# Patient Record
Sex: Female | Born: 1980 | Race: White | Hispanic: No | Marital: Married | Smoking: Never smoker
Health system: Southern US, Community
[De-identification: ages and names within clinical notes are randomized; demographics above are authoritative.]

## PROBLEM LIST (undated history)

## (undated) DIAGNOSIS — K319 Disease of stomach and duodenum, unspecified: Secondary | ICD-10-CM

## (undated) DIAGNOSIS — F329 Major depressive disorder, single episode, unspecified: Secondary | ICD-10-CM

## (undated) DIAGNOSIS — Z800 Family history of malignant neoplasm of digestive organs: Secondary | ICD-10-CM

## (undated) DIAGNOSIS — Z803 Family history of malignant neoplasm of breast: Secondary | ICD-10-CM

## (undated) DIAGNOSIS — R0600 Dyspnea, unspecified: Secondary | ICD-10-CM

## (undated) DIAGNOSIS — K829 Disease of gallbladder, unspecified: Secondary | ICD-10-CM

## (undated) DIAGNOSIS — I5042 Chronic combined systolic (congestive) and diastolic (congestive) heart failure: Secondary | ICD-10-CM

## (undated) DIAGNOSIS — M549 Dorsalgia, unspecified: Secondary | ICD-10-CM

## (undated) DIAGNOSIS — E039 Hypothyroidism, unspecified: Secondary | ICD-10-CM

## (undated) DIAGNOSIS — M797 Fibromyalgia: Secondary | ICD-10-CM

## (undated) DIAGNOSIS — G4730 Sleep apnea, unspecified: Secondary | ICD-10-CM

## (undated) DIAGNOSIS — I428 Other cardiomyopathies: Secondary | ICD-10-CM

## (undated) DIAGNOSIS — J45909 Unspecified asthma, uncomplicated: Secondary | ICD-10-CM

## (undated) DIAGNOSIS — F32A Depression, unspecified: Secondary | ICD-10-CM

## (undated) DIAGNOSIS — I10 Essential (primary) hypertension: Secondary | ICD-10-CM

## (undated) DIAGNOSIS — M419 Scoliosis, unspecified: Secondary | ICD-10-CM

## (undated) DIAGNOSIS — E559 Vitamin D deficiency, unspecified: Secondary | ICD-10-CM

## (undated) DIAGNOSIS — Q249 Congenital malformation of heart, unspecified: Secondary | ICD-10-CM

## (undated) DIAGNOSIS — F419 Anxiety disorder, unspecified: Secondary | ICD-10-CM

## (undated) DIAGNOSIS — E538 Deficiency of other specified B group vitamins: Secondary | ICD-10-CM

## (undated) DIAGNOSIS — K589 Irritable bowel syndrome without diarrhea: Secondary | ICD-10-CM

## (undated) DIAGNOSIS — E079 Disorder of thyroid, unspecified: Secondary | ICD-10-CM

## (undated) HISTORY — DX: Dyspnea, unspecified: R06.00

## (undated) HISTORY — DX: Hypothyroidism, unspecified: E03.9

## (undated) HISTORY — DX: Disease of stomach and duodenum, unspecified: K31.9

## (undated) HISTORY — DX: Irritable bowel syndrome, unspecified: K58.9

## (undated) HISTORY — DX: Dorsalgia, unspecified: M54.9

## (undated) HISTORY — DX: Morbid (severe) obesity due to excess calories: E66.01

## (undated) HISTORY — DX: Disorder of thyroid, unspecified: E07.9

## (undated) HISTORY — DX: Fibromyalgia: M79.7

## (undated) HISTORY — DX: Disease of gallbladder, unspecified: K82.9

## (undated) HISTORY — DX: Vitamin D deficiency, unspecified: E55.9

## (undated) HISTORY — DX: Unspecified asthma, uncomplicated: J45.909

## (undated) HISTORY — DX: Family history of malignant neoplasm of digestive organs: Z80.0

## (undated) HISTORY — PX: TRANSTHORACIC ECHOCARDIOGRAM: SHX275

## (undated) HISTORY — DX: Sleep apnea, unspecified: G47.30

## (undated) HISTORY — DX: Depression, unspecified: F32.A

## (undated) HISTORY — DX: Congenital malformation of heart, unspecified: Q24.9

## (undated) HISTORY — DX: Major depressive disorder, single episode, unspecified: F32.9

## (undated) HISTORY — DX: Family history of malignant neoplasm of breast: Z80.3

## (undated) HISTORY — DX: Other cardiomyopathies: I42.8

## (undated) HISTORY — DX: Essential (primary) hypertension: I10

## (undated) HISTORY — DX: Deficiency of other specified B group vitamins: E53.8

## (undated) HISTORY — DX: Anxiety disorder, unspecified: F41.9

## (undated) HISTORY — DX: Scoliosis, unspecified: M41.9

## (undated) HISTORY — DX: Chronic combined systolic (congestive) and diastolic (congestive) heart failure: I50.42

---

## 2011-05-08 HISTORY — PX: CHOLECYSTECTOMY: SHX55

## 2011-07-06 DIAGNOSIS — I5042 Chronic combined systolic (congestive) and diastolic (congestive) heart failure: Secondary | ICD-10-CM

## 2011-07-06 HISTORY — DX: Chronic combined systolic (congestive) and diastolic (congestive) heart failure: I50.42

## 2011-07-06 HISTORY — PX: TRANSTHORACIC ECHOCARDIOGRAM: SHX275

## 2011-08-06 HISTORY — PX: CARDIAC CATHETERIZATION: SHX172

## 2011-08-06 HISTORY — PX: OTHER SURGICAL HISTORY: SHX169

## 2014-01-28 ENCOUNTER — Ambulatory Visit (INDEPENDENT_AMBULATORY_CARE_PROVIDER_SITE_OTHER): Payer: Commercial Managed Care - PPO | Admitting: Internal Medicine

## 2014-01-28 ENCOUNTER — Other Ambulatory Visit (INDEPENDENT_AMBULATORY_CARE_PROVIDER_SITE_OTHER): Payer: Commercial Managed Care - PPO

## 2014-01-28 ENCOUNTER — Encounter: Payer: Self-pay | Admitting: Internal Medicine

## 2014-01-28 ENCOUNTER — Encounter (INDEPENDENT_AMBULATORY_CARE_PROVIDER_SITE_OTHER): Payer: Self-pay

## 2014-01-28 VITALS — BP 138/82 | HR 99 | Temp 98.4°F | Resp 12 | Ht 60.0 in | Wt 196.4 lb

## 2014-01-28 DIAGNOSIS — E039 Hypothyroidism, unspecified: Secondary | ICD-10-CM

## 2014-01-28 DIAGNOSIS — J45909 Unspecified asthma, uncomplicated: Secondary | ICD-10-CM

## 2014-01-28 DIAGNOSIS — I509 Heart failure, unspecified: Secondary | ICD-10-CM

## 2014-01-28 DIAGNOSIS — Z3049 Encounter for surveillance of other contraceptives: Secondary | ICD-10-CM

## 2014-01-28 DIAGNOSIS — I5032 Chronic diastolic (congestive) heart failure: Secondary | ICD-10-CM | POA: Insufficient documentation

## 2014-01-28 DIAGNOSIS — IMO0001 Reserved for inherently not codable concepts without codable children: Secondary | ICD-10-CM | POA: Insufficient documentation

## 2014-01-28 LAB — LIPID PANEL
Cholesterol: 176 mg/dL (ref 0–200)
HDL: 49.8 mg/dL (ref >39.00)
LDL CALC: 107 mg/dL — AB (ref 0–99)
NonHDL: 126.2
TRIGLYCERIDES: 96 mg/dL (ref 0.0–149.0)
Total CHOL/HDL Ratio: 4
VLDL: 19.2 mg/dL (ref 0.0–40.0)

## 2014-01-28 LAB — CBC
HCT: 41.5 % (ref 36.0–46.0)
Hemoglobin: 13.9 g/dL (ref 12.0–15.0)
MCHC: 33.5 g/dL (ref 30.0–36.0)
MCV: 87.5 fl (ref 78.0–100.0)
Platelets: 479 10*3/uL — ABNORMAL HIGH (ref 150.0–400.0)
RBC: 4.75 Mil/uL (ref 3.87–5.11)
RDW: 13.5 % (ref 11.5–15.5)

## 2014-01-28 LAB — BASIC METABOLIC PANEL
BUN: 14 mg/dL (ref 6–23)
CO2: 26 mEq/L (ref 19–32)
Calcium: 8.9 mg/dL (ref 8.4–10.5)
Chloride: 101 mEq/L (ref 96–112)
Creatinine, Ser: 0.9 mg/dL (ref 0.4–1.2)
GFR: 74.28 mL/min (ref >60.00)
Glucose, Bld: 85 mg/dL (ref 70–99)
POTASSIUM: 4.3 meq/L (ref 3.5–5.1)
SODIUM: 137 meq/L (ref 135–145)

## 2014-01-28 LAB — TSH: TSH: 5.79 u[IU]/mL — AB (ref 0.35–4.50)

## 2014-01-28 LAB — T4, FREE: Free T4: 1.01 ng/dL (ref 0.60–1.60)

## 2014-01-28 MED ORDER — DICYCLOMINE HCL 20 MG PO TABS: 20.0000 mg | ORAL_TABLET | ORAL | Status: DC | PRN

## 2014-01-28 MED ORDER — METOPROLOL SUCCINATE ER 25 MG PO TB24: 12.5000 mg | ORAL_TABLET | Freq: Every day | ORAL | Status: DC

## 2014-01-28 MED ORDER — BUDESONIDE-FORMOTEROL FUMARATE 160-4.5 MCG/ACT IN AERO: 2.0000 | INHALATION_SPRAY | Freq: Every day | RESPIRATORY_TRACT | Status: DC

## 2014-01-28 MED ORDER — NORETHINDRONE-ETH ESTRADIOL 1-35 MG-MCG PO TABS: 1.0000 | ORAL_TABLET | Freq: Every day | ORAL | Status: DC

## 2014-01-28 MED ORDER — ALBUTEROL SULFATE HFA 108 (90 BASE) MCG/ACT IN AERS: 2.0000 | INHALATION_SPRAY | Freq: Four times a day (QID) | RESPIRATORY_TRACT | Status: DC | PRN

## 2014-01-28 MED ORDER — FUROSEMIDE 40 MG PO TABS: 40.0000 mg | ORAL_TABLET | Freq: Every day | ORAL | Status: DC

## 2014-01-28 NOTE — Progress Notes (Signed)
Pre visit review using our clinic review tool, if applicable. No additional management support is needed unless otherwise documented below in the visit note. 

## 2014-01-28 NOTE — Assessment & Plan Note (Signed)
Check TSH and free T4. Will refill synthroid when labs back. Recheck in 3 months

## 2014-01-28 NOTE — Assessment & Plan Note (Addendum)
Unclear type although sounds to be systolic given her description. Per her report this was not related to her thyroid (they found CHF in 2013 and been on thyroid medication for 7 years) Refill on lasix given likely fluid overload. Referral to cardiology and will obtain records from previous workup. Checking BMP, CBC, TSH, lipid panel, free T4.

## 2014-01-28 NOTE — Assessment & Plan Note (Signed)
Will refill her birth control until she can establish with ob/gyn.

## 2014-01-28 NOTE — Patient Instructions (Signed)
We will have you go down to the lab to check on your blood work. Start taking your lasix again to see if this helps your breathing problems. Your lungs sounded clear today so I do not think it is from the asthma.  We will also have you get records sent from your last doctor so we don't have to repeat things. We will send you to a heart doctor and you should hear back about that.   We will call you with the lab results and send in your thyroid medicine after the test comes back.  Come back in about 3 months to check on your thyroid.

## 2014-01-28 NOTE — Progress Notes (Signed)
   Subjective:    Patient ID: Meredith Diaz, female    DOB: Mar 03, 1981, 33 y.o.   MRN: [id number]  HPI The patient is a 32 YO female who is coming to establish care. She recently moved from [facility], [place] and needs to see a doctor. She has PMH of CHF (type she doesn't remember), possible HTN, hyperlipidemia, asthma. She has been out of her thyroid medicine for about 1 month. She has been out of her lasix for about 1-2 weeks and thinks she has been a little short of breath while walking and at night time. She still has her asthma medicines and has been using those. She denies abdominal pain, constipation/diarrhea. She does use birth control and her only partner is her husband. She still has some of that but may need more before she can establish with a gynecologist.   Review of Systems  Constitutional: Positive for activity change. Negative for fever, appetite change and fatigue.  HENT: Positive for congestion.   Eyes: Negative.   Respiratory: Positive for shortness of breath. Negative for cough, chest tightness and wheezing.   Cardiovascular: Positive for leg swelling. Negative for chest pain and palpitations.  Gastrointestinal: Negative for abdominal pain, diarrhea, constipation and abdominal distention.  Endocrine: Negative.   Genitourinary: Negative.   Musculoskeletal: Negative.   Neurological: Negative.       Objective:   Physical Exam  Constitutional: She is oriented to person, place, and time. She appears well-developed and well-nourished. No distress.  HENT:  Head: Normocephalic and atraumatic.  Eyes: EOM are normal.  Neck: Normal range of motion.  Cardiovascular: Normal rate and regular rhythm.   Pulmonary/Chest: Effort normal and breath sounds normal. She has no wheezes.  Trace rales in the bases, no wheezing  Abdominal: Soft. Bowel sounds are normal. She exhibits no distension. There is no tenderness. There is no rebound.  Musculoskeletal: She exhibits edema.  1+ pedal edema  bilateral, not in the legs.   Neurological: She is alert and oriented to person, place, and time. Coordination normal.  Skin: Skin is warm and dry.   Filed Vitals:   01/28/14 1040  BP: 138/82  Pulse: 99  Temp: 98.4 F (36.9 C)  TempSrc: Oral  Resp: 12  Height: 5' (1.524 m)  Weight: 196 lb 6.4 oz (89.086 kg)  SpO2: 94%      Assessment & Plan:

## 2014-01-28 NOTE — Assessment & Plan Note (Signed)
Will refill symbicort and ventolin. She appears to be controlled at this time and feel SOB is related to her CHF and not asthma. No focal wheezing or abnormality on exam.

## 2014-01-29 MED ORDER — LEVOTHYROXINE SODIUM 50 MCG PO TABS: 50.0000 ug | ORAL_TABLET | Freq: Every day | ORAL | Status: DC

## 2014-01-29 NOTE — Addendum Note (Signed)
Addended by: Genella Mech A on: 01/29/2014 01:01 PM   Modules accepted: Orders

## 2014-03-08 ENCOUNTER — Ambulatory Visit (INDEPENDENT_AMBULATORY_CARE_PROVIDER_SITE_OTHER): Payer: Commercial Managed Care - PPO | Admitting: Cardiology

## 2014-03-08 ENCOUNTER — Encounter: Payer: Self-pay | Admitting: Cardiology

## 2014-03-08 VITALS — BP 160/100 | HR 103 | Ht 60.0 in | Wt 196.7 lb

## 2014-03-08 DIAGNOSIS — J4530 Mild persistent asthma, uncomplicated: Secondary | ICD-10-CM

## 2014-03-08 DIAGNOSIS — I5031 Acute diastolic (congestive) heart failure: Secondary | ICD-10-CM

## 2014-03-08 DIAGNOSIS — I5032 Chronic diastolic (congestive) heart failure: Secondary | ICD-10-CM

## 2014-03-08 DIAGNOSIS — I10 Essential (primary) hypertension: Secondary | ICD-10-CM

## 2014-03-08 DIAGNOSIS — Q249 Congenital malformation of heart, unspecified: Secondary | ICD-10-CM

## 2014-03-08 DIAGNOSIS — E669 Obesity, unspecified: Secondary | ICD-10-CM

## 2014-03-08 MED ORDER — METOPROLOL SUCCINATE ER 25 MG PO TB24: 25.0000 mg | ORAL_TABLET | Freq: Every day | ORAL | Status: DC

## 2014-03-08 MED ORDER — ISOSORB DINITRATE-HYDRALAZINE 20-37.5 MG PO TABS: 1.0000 | ORAL_TABLET | Freq: Three times a day (TID) | ORAL | Status: DC

## 2014-03-08 MED ORDER — METOLAZONE 2.5 MG PO TABS
2.5000 mg | ORAL_TABLET | Freq: Every day | ORAL | Status: DC
Start: 2014-03-08 — End: 2014-11-24

## 2014-03-08 MED ORDER — ISOSORB DINITRATE-HYDRALAZINE 20-37.5 MG PO TABS: 10.0000 | ORAL_TABLET | Freq: Three times a day (TID) | ORAL | Status: DC

## 2014-03-08 NOTE — Patient Instructions (Signed)
Increase metoprolol ER  25 MG ONCE DAILY  BIDIL 20-37.5 MG TABLET TAKE 1/2 TABLET THREE TIMES A DAY    ZAROXOLYN (METOLAZONE)2.5 MG  TAKE 30 MIN PRIOR TO FUROSEMIDE   FOR 3 DAYS THEN USE AS NEEDED.  PLEASE LAB DRAWN ON Monday Mar 15 2014- BMP , PRO -BNP  Your physician has requested that you have an echocardiogram. Echocardiography is a painless test that uses sound waves to create images of your heart. It provides your doctor with information about the size and shape of your heart and how well your heart's chambers and valves are working. This procedure takes approximately one hour. There are no restrictions for this procedure.   Your physician recommends that you schedule a follow-up appointment in NOV 12 , 2015 AT 4PM WITH LAURA INGOLD.

## 2014-03-09 DIAGNOSIS — I10 Essential (primary) hypertension: Secondary | ICD-10-CM | POA: Insufficient documentation

## 2014-03-09 DIAGNOSIS — Q249 Congenital malformation of heart, unspecified: Secondary | ICD-10-CM | POA: Insufficient documentation

## 2014-03-09 DIAGNOSIS — I5042 Chronic combined systolic (congestive) and diastolic (congestive) heart failure: Secondary | ICD-10-CM | POA: Insufficient documentation

## 2014-03-09 NOTE — Assessment & Plan Note (Signed)
This may be partially responsible for her dyspnea.

## 2014-03-09 NOTE — Assessment & Plan Note (Signed)
I need to get the cardiac cath and MRI reports as well as her previous echocardiogram reports. I need to figure out what operation she had as a child. We'll repeat an echocardiogram to get a new baseline.

## 2014-03-09 NOTE — Assessment & Plan Note (Signed)
Increase beta blocker dose and add BiDil

## 2014-03-09 NOTE — Assessment & Plan Note (Addendum)
But we don't know his EF. We need an echocardiogram to assess that as well as her diastolic function.  Plan will be to continue with metoprolol increased doses 25 mg. She is tachycardic today. She also has positive blood pressure. I will add hydralazine/nitrate and BiDil for afterload reduction given her angioedema from ACE inhibitor/ARB. I will also add Zaroxolyn that she will take daily for the first 3 days and then as needed for weight gain. She will continue on Lasix daily 40 mg with an additional 20-40 mg based on weight change.  Plan:  Increase Toprol to 25 mg  Start BiDil one half tab twice a day  Continue Lasix 40 mg daily  Zaroxolyn 2.5 mg daily 30 minutes 4 AM dose of Lasix for 3 days, then when necessary weight gain greater than 3 pounds.  Check chemistry panel and proBNP next Monday  Follow-up in 2 weeks with either myself or a mid-level practitioner.

## 2014-03-09 NOTE — Assessment & Plan Note (Signed)
She usually weighs in the 180 pound range, but is currently 196. Once we figure out her heart failure situation we will discuss the importance of dietary modification and exercise.

## 2014-03-09 NOTE — Progress Notes (Signed)
PATIENT: Meredith Pattenllis G Doell MRN: [id number] DOB: 10/09/1980 PCP: Judie BonusKollar, Elizabeth A, MD  Clinic Note: Chief Complaint  Patient presents with  . Establish Care    pt denies chest pain. pt states that she does experience sob and some swelling in her feet   HPI: Meredith Diaz is a 33 y.o. female with a PMHof congenital heart disease status post surgical repair as a child child but with no major problems until 2013 when she was minimally to the hospital with a diagnosis of CHF and ejection fraction of roughly 20%. She had about a month in the hospital. Then on medications her PE she gradually improved to an ejection fraction of roughly 50% by last echocardiogram and maybe one year ago. Her heart failure was evaluated with chronic catheterization and cardiac MRI with no gross abnormalities.  [place] [facility] --we do not have previous records.  Interval History: she presents to establish cardiology care, partly because over the last month or so she's been getting more progressively dyspneic on exertion. She also notes more PND and orthopnea. She doesn't really have much in way of edema but usually has distention of her abdomen. She has been taking more than the usual lateral Lasix that she takes. She previously would take this as needed and is now taking 40 mg a day and has not really noticed that much of improvement. She denies any chest tightness or pressure with rest or exertion. She really is not much of a resting dyspnea either. Occasional palpitations but no arrhythmias noted. No syncope/near-syncope or TIA/amaurosis fugax symptoms. She has had some dizziness spells.  Past Medical History  Diagnosis Date  . Chronic diastolic congestive heart failure, NYHA class 2     in 2013, EF was 20% - normal cardiac catheterization and cardiac MRI; follow-up echocardiogram and 14 with EF of 50%  . Asthma   . Thyroid disease   . Depression   . Anxiety   .  Essential hypertension   . Stomach problems   . Hypertension   . Congenital heart disease, adult     status post repair in childhood -- was a failure to thrive child status post Cardec surgery ( unknown details))    Prior Cardiac Evaluation and Past Surgical History: Past Surgical History  Procedure Laterality Date  . Cholecystectomy  2013  . Cardiac catheterization      Allergies  Allergen Reactions  . Lisinopril Swelling    Angioedema facial    Current Outpatient Prescriptions  Medication Sig Dispense Refill  . albuterol (VENTOLIN HFA) 108 (90 BASE) MCG/ACT inhaler Inhale 2 puffs into the lungs every 6 (six) hours as needed. 6.7 g 12  . budesonide-formoterol (SYMBICORT) 160-4.5 MCG/ACT inhaler Inhale 2 puffs into the lungs daily. 1 Inhaler 12  . dicyclomine (BENTYL) 20 MG tablet Take 1 tablet (20 mg total) by mouth as needed for spasms. 60 tablet 3  . furosemide (LASIX) 40 MG tablet Take 1 tablet (40 mg total) by mouth daily. 30 tablet 6  . levothyroxine (SYNTHROID, LEVOTHROID) 50 MCG tablet Take 1 tablet (50 mcg total) by mouth daily before breakfast. 30 tablet 3  . norethindrone-ethinyl estradiol 1/35 (ALAYCEN 1/35) tablet Take 1 tablet by mouth daily. 1 Package 12  . isosorbide-hydrALAZINE (BIDIL) 20-37.5 MG per tablet Take 10-18.5 tablets by mouth 3 (three) times daily. 15 tablet 6  . isosorbide-hydrALAZINE (BIDIL) 20-37.5 MG per tablet Take 1 tablet by mouth 3 (three) times  daily. 16 tablet 0  . metolazone (ZAROXOLYN) 2.5 MG tablet Take 1 tablet (2.5 mg total) by mouth daily. 30 tablet 6  . metoprolol succinate (TOPROL XL) 25 MG 24 hr tablet Take 1 tablet (25 mg total) by mouth daily. 30 tablet 11   No current facility-administered medications for this visit.    History   Social History Narrative    family history includes Depression in her mother; Heart disease in her maternal grandfather and paternal grandmother; Hypertension in her father.  ROS: A comprehensive  Review of Systems - was performed Review of Systems  Constitutional: Negative for weight loss, malaise/fatigue and diaphoresis.       ~10 lb wgt gain over several months.  HENT: Negative for nosebleeds.   Respiratory: Positive for shortness of breath. Negative for cough, hemoptysis and wheezing.   Cardiovascular: Positive for palpitations, orthopnea and PND. Negative for chest pain, claudication and leg swelling.  Gastrointestinal: Negative for blood in stool and melena.  Genitourinary: Negative for hematuria.  Neurological: Positive for dizziness. Negative for tremors, sensory change, speech change, focal weakness, seizures and loss of consciousness.  Endo/Heme/Allergies: Does not bruise/bleed easily.  Psychiatric/Behavioral: Positive for depression. The patient is nervous/anxious.   All other systems reviewed and are negative.  PHYSICAL EXAM BP 160/100 mmHg  Pulse 103  Ht 5' (1.524 m)  Wt 196 lb 11.2 oz (89.223 kg)  BMI 38.42 kg/m2 Physical Exam  Constitutional: She is oriented to person, place, and time. She appears well-developed and well-nourished. No distress.  HENT:  Head: Normocephalic and atraumatic.  Mouth/Throat: Oropharynx is clear and moist. No oropharyngeal exudate.  Eyes: Conjunctivae and EOM are normal. Pupils are equal, round, and reactive to light. No scleral icterus.  Neck: Normal range of motion. Neck supple. No JVD present. No tracheal deviation present.  Cardiovascular: Regular rhythm and normal heart sounds.   Occasional extrasystoles are present. Tachycardia present.  PMI is not displaced.  Exam reveals no gallop and no decreased pulses.   No murmur heard. Pulmonary/Chest: Effort normal and breath sounds normal. No respiratory distress. She has no wheezes. She has no rales. She exhibits no tenderness.  Abdominal: Soft. Bowel sounds are normal. She exhibits no distension and no mass. There is no tenderness.  Genitourinary:  deferred  Musculoskeletal: Normal  range of motion.  Trace, minimal edema  Neurological: She is alert and oriented to person, place, and time. No cranial nerve deficit. Coordination normal.  Skin: Skin is warm and dry. No rash noted. She is not diaphoretic. No erythema.  Psychiatric: She has a normal mood and affect. Her behavior is normal. Judgment and thought content normal.  A bit anxious    Adult ECG Report  Rate: 103 ;  Rhythm: sinus tachycardia  QRS Axis: -22 ;  PR Interval: 144 ;  QRS Duration: 84; QTc: 495  Voltages: borderline for LVH  Conduction Disturbances: none  Other Abnormalities: possible LA 8, borderline criteria for LVH  Narrative Interpretation: mildly abnormal EKG  Recent Labs:  Lab Results  Component Value Date   CHOL 176 01/28/2014   HDL 49.80 01/28/2014   LDLCALC 107* 01/28/2014   TRIG 96.0 01/28/2014   CHOLHDL 4 01/28/2014     Chemistry      Component Value Date/Time   NA 137 01/28/2014 1123   K 4.3 01/28/2014 1123   CL 101 01/28/2014 1123   CO2 26 01/28/2014 1123   BUN 14 01/28/2014 1123   CREATININE 0.9 01/28/2014 1123  Component Value Date/Time   CALCIUM 8.9 01/28/2014 1123      ASSESSMENT / PLAN: 33 year old young woman with prior history of CHF supposedly improved after medical therapy. She now presents with sounds like exacerbation of her chronic heart failure. We don't know what her EF is currently.  Acute diastolic heart failure But we don't know his EF. We need an echocardiogram to assess that as well as her diastolic function.  Plan will be to continue with metoprolol increased doses 25 mg. She is tachycardic today. She also has positive blood pressure. I will add hydralazine/nitrate and BiDil for afterload reduction given her angioedema from ACE inhibitor/ARB. I will also add Zaroxolyn that she will take daily for the first 3 days and then as needed for weight gain. She will continue on Lasix daily 40 mg with an additional 20-40 mg based on weight  change.  Plan:  Increase Toprol to 25 mg  Start BiDil one half tab twice a day  Continue Lasix 40 mg daily  Zaroxolyn 2.5 mg daily 30 minutes 4 AM dose of Lasix for 3 days, then when necessary weight gain greater than 3 pounds.  Check chemistry panel and proBNP next Monday  Follow-up in 2 weeks with either myself or a mid-level practitioner.  Congenital heart disease, adult I need to get the cardiac cath and MRI reports as well as her previous echocardiogram reports. I need to figure out what operation she had as a child. We'll repeat an echocardiogram to get a new baseline.  Essential hypertension Increase beta blocker dose and add BiDil  Obesity (BMI 30-39.9) She usually weighs in the 180 pound range, but is currently 196. Once we figure out her heart failure situation we will discuss the importance of dietary modification and exercise.  Asthma, chronic This may be partially responsible for her dyspnea.    Orders Placed This Encounter  Procedures  . Basic metabolic panel  . Pro b natriuretic peptide (BNP)  . EKG 12-Lead  . 2D Echocardiogram without contrast    Standing Status: Future     Number of Occurrences:      Standing Expiration Date: 03/08/2015    Order Specific Question:  Type of Echo    Answer:  Complete    Order Specific Question:  Where should this test be performed    Answer:  [facility]    Order Specific Question:  Reason for exam-Echo    Answer:  CHF-Acute Diastolic  428.31 / I50.31    Order Specific Question:  Reason for exam-Echo    Answer:  Congestive Heart Failure  428.0 / I50.9   Meds ordered this encounter  Medications  . metoprolol succinate (TOPROL XL) 25 MG 24 hr tablet    Sig: Take 1 tablet (25 mg total) by mouth daily.    Dispense:  30 tablet    Refill:  11  . isosorbide-hydrALAZINE (BIDIL) 20-37.5 MG per tablet    Sig: Take 10-18.5 tablets by mouth 3 (three) times daily.    Dispense:  15 tablet    Refill:  6    (1/2 of  20-37.5 tablet)  . metolazone (ZAROXOLYN) 2.5 MG tablet    Sig: Take 1 tablet (2.5 mg total) by mouth daily.    Dispense:  30 tablet    Refill:  6  . isosorbide-hydrALAZINE (BIDIL) 20-37.5 MG per tablet    Sig: Take 1 tablet by mouth 3 (three) times daily.    Dispense:  16 tablet  Refill:  0    Lot [id number] P2 06/2016    Followup: 2 weeks with Mid-Level Practitioner    Marykay Lex, M.D., M.S. Interventional Cardiologist   Pager # [phone]

## 2014-03-10 ENCOUNTER — Telehealth: Payer: Self-pay | Admitting: Cardiology

## 2014-03-10 NOTE — Telephone Encounter (Signed)
Received 26 pages of records from [facility] for appointment with Nada Boozer, NP on 03/18/14.  Given to N. Hines (medical records) for Corliss Blacker schedule on 03/18/14.

## 2014-03-12 ENCOUNTER — Telehealth: Payer: Self-pay | Admitting: Cardiology

## 2014-03-12 MED ORDER — HYDRALAZINE HCL 25 MG PO TABS: 25.0000 mg | ORAL_TABLET | Freq: Three times a day (TID) | ORAL | Status: DC

## 2014-03-12 NOTE — Telephone Encounter (Signed)
Instructed patient to STOP bidil and start hydralazine 25mg  TID and call us with and updated on how she feels in about a week. If she feels OK can proceed with adding low-dose imdur as denoted below. Patient voiced understanding. Med sent to pharmacy

## 2014-03-12 NOTE — Telephone Encounter (Signed)
Patient states that she was here on Monday 03/08/14 and saw Dr. Herbie Baltimore.  He placed her on a new medication (Bidil she thinks) and she is having a reaction.  Please call.

## 2014-03-12 NOTE — Telephone Encounter (Signed)
Patient thinks she is having side effects from Bidil. She reports joint aches & swelling that has gotten worse since starting on medication on Tuesday. She c/o lightheadedness (which she states is to be expected since this medication is lowering her BP). She noted since yesterday nausea and vomiting and does not feel well. She has been taking Bidil 1/2 tablet 3x daily. Patient does not monitor BP at home. Patient did not take Bidil this AM and is feeling better.  Patient has had angioedema w/lisinopril.   Advice is needed on SE and med dose clarification.  Dr. Elissa Hefty note "Plan: Start BiDil one half tab twice a day" >>>> instructions on AVS state to take 1/2 tablet TID  Routed to Dr. Herbie Baltimore for clarification and Belenda Cruise (to advise on SE?)

## 2014-03-12 NOTE — Telephone Encounter (Signed)
Meredith Diaz  Can you call patient with this info.

## 2014-03-12 NOTE — Telephone Encounter (Signed)
Let's try Meredith Diaz's plan;  Do hydralazine 25 twice a day or 3 times a day.  DH

## 2014-03-12 NOTE — Telephone Encounter (Signed)
N/V not uncommon SE from Bidil, more likely due to isosorbide.  Maybe try hydralazine 25mg  tid by itself then later add low dose imdur (15mg ) and see if she tolerates that.

## 2014-03-15 ENCOUNTER — Ambulatory Visit (HOSPITAL_COMMUNITY)
Admission: RE | Admit: 2014-03-15 | Discharge: 2014-03-15 | Disposition: A | Discharge status: Home / Self Care | Payer: Commercial Managed Care - PPO | Source: Ambulatory Visit | Attending: Cardiology | Admitting: Cardiology

## 2014-03-15 DIAGNOSIS — I5031 Acute diastolic (congestive) heart failure: Secondary | ICD-10-CM

## 2014-03-15 DIAGNOSIS — Z8249 Family history of ischemic heart disease and other diseases of the circulatory system: Secondary | ICD-10-CM | POA: Insufficient documentation

## 2014-03-15 DIAGNOSIS — I509 Heart failure, unspecified: Secondary | ICD-10-CM | POA: Diagnosis present

## 2014-03-15 DIAGNOSIS — I517 Cardiomegaly: Secondary | ICD-10-CM

## 2014-03-15 DIAGNOSIS — I10 Essential (primary) hypertension: Secondary | ICD-10-CM | POA: Diagnosis not present

## 2014-03-15 DIAGNOSIS — I5032 Chronic diastolic (congestive) heart failure: Secondary | ICD-10-CM

## 2014-03-15 NOTE — Progress Notes (Signed)
2D Echocardiogram Complete.  03/15/2014   Kassadie Pancake, RDCS  

## 2014-03-16 LAB — BASIC METABOLIC PANEL
BUN: 13 mg/dL (ref 6–23)
CALCIUM: 8.9 mg/dL (ref 8.4–10.5)
CO2: 29 mEq/L (ref 19–32)
Chloride: 95 mEq/L — ABNORMAL LOW (ref 96–112)
Creat: 0.86 mg/dL (ref 0.50–1.10)
GLUCOSE: 92 mg/dL (ref 70–99)
POTASSIUM: 3.7 meq/L (ref 3.5–5.3)
SODIUM: 136 meq/L (ref 135–145)

## 2014-03-16 LAB — PRO B NATRIURETIC PEPTIDE: Pro B Natriuretic peptide (BNP): 104.8 pg/mL (ref <126)

## 2014-03-18 ENCOUNTER — Encounter: Payer: Self-pay | Admitting: Cardiology

## 2014-03-18 ENCOUNTER — Ambulatory Visit (INDEPENDENT_AMBULATORY_CARE_PROVIDER_SITE_OTHER): Payer: Commercial Managed Care - PPO | Admitting: Cardiology

## 2014-03-18 VITALS — BP 147/86 | HR 93 | Ht 60.0 in | Wt 197.0 lb

## 2014-03-18 DIAGNOSIS — Q249 Congenital malformation of heart, unspecified: Secondary | ICD-10-CM

## 2014-03-18 DIAGNOSIS — I5032 Chronic diastolic (congestive) heart failure: Secondary | ICD-10-CM

## 2014-03-18 DIAGNOSIS — I5031 Acute diastolic (congestive) heart failure: Secondary | ICD-10-CM

## 2014-03-18 MED ORDER — HYDRALAZINE HCL 25 MG PO TABS: 37.5000 mg | ORAL_TABLET | Freq: Three times a day (TID) | ORAL | Status: DC

## 2014-03-18 MED ORDER — FUROSEMIDE 40 MG PO TABS: 60.0000 mg | ORAL_TABLET | Freq: Every day | ORAL | Status: DC

## 2014-03-18 NOTE — Progress Notes (Signed)
03/21/2014   PCP: Judie Bonus, MD   Chief Complaint  Patient presents with  . Follow-up    follow-up Echo    Primary Cardiologist: Dr. Ranae Palms   HPI:  33 y.o. female with a PMHof congenital heart disease status post surgical repair as a child but with no major problems until 2013 when she was admitted to the hospital with a diagnosis of CHF and ejection fraction of roughly 20%. She had about a month in the hospital. Then on medications her EF she gradually improved to an ejection fraction of roughly 50% by last echocardiogram and maybe one year ago. Her heart failure was evaluated with chronic catheterization and cardiac MRI with no gross abnormalities.  He has recently moved [address] [place], [place] from [place], [place] --her records have since come in.  Interval History: she presented 1 week ago to establish cardiology care, partly because over the last month or so she's been getting more progressively dyspneic on exertion. She also notes more PND and orthopnea. She doesn't really have much in way of edema but usually has distention of her abdomen. She has been taking more than the usual lateral Lasix that she takes. She previously would take this as needed and is now taking 40 mg a day and has not really noticed that much of improvement. She denies any chest tightness or pressure with rest or exertion. She really is not much of a resting dyspnea either. Occasional palpitations but no arrhythmias noted. No syncope/near-syncope or TIA/amaurosis fugax symptoms. She has had some dizziness spells.    She has had echo and is back today for results.  She feels much better.  Stairs cause SOB but just walking now does not.  Her HR is still in the 90s.    Echo: Left ventricle: The cavity size was normal. Wall thickness was increased in a pattern of mild LVH. 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 Systolic function was mildly to moderately reduced. The estimated ejection fraction was in the range of 40% to 45%. Diffuse hypokinesis. Doppler parameters are consistent with abnormal left ventricular relaxation (grade 1 diastolic dysfunction). The E/e&' ratio is between 8-15, suggesting indeterminate LV Filling pressure. - Mitral valve: Mildly thickened leaflets . There was trivial regurgitation. - Left atrium: The atrium was normal in size.  Impressions:  - LVEF 40-45%, moderate global hypokinesis, diastolic dysfunction, indeterminate (likely elevated) LV filling pressure. Consider additional imaging with cMRI to evalute for possible apical variant HCM.  With old records, her EF 09/2012 found EF 45-50%.  MRI previously without structural disease.  Cath with minimal CAD.   Allergies  Allergen Reactions  . Lisinopril Swelling    Angioedema facial    Current Outpatient Prescriptions  Medication Sig Dispense Refill  . albuterol (VENTOLIN HFA) 108 (90 BASE) MCG/ACT inhaler Inhale 2 puffs into the lungs every 6 (six) hours as needed. 6.7 g 12  . budesonide-formoterol (SYMBICORT) 160-4.5 MCG/ACT inhaler Inhale 2 puffs into the lungs daily. 1 Inhaler 12  . dicyclomine (BENTYL) 20 MG tablet Take 1 tablet (20 mg total) by mouth as needed for spasms. 60 tablet 3  . furosemide (LASIX) 40 MG tablet Take 1.5 tablets (60 mg total) by mouth daily. 45 tablet 6  . hydrALAZINE (APRESOLINE) 25 MG tablet Take 1.5 tablets (37.5 mg total) by mouth 3 (three) times daily. 135 tablet 6  .  levothyroxine (SYNTHROID, LEVOTHROID) 50 MCG tablet Take 1 tablet (50 mcg total) by mouth daily before breakfast. 30 tablet 3  . metolazone (ZAROXOLYN) 2.5 MG tablet Take 1 tablet (2.5 mg total) by mouth daily. 30 tablet 6  . metoprolol succinate (TOPROL XL) 25 MG 24 hr tablet Take 1 tablet (25 mg total) by mouth daily. 30 tablet 11    . norethindrone-ethinyl estradiol 1/35 (ALAYCEN 1/35) tablet Take 1 tablet by mouth daily. 1 Package 12   No current facility-administered medications for this visit.    Past Medical History  Diagnosis Date  . Chronic diastolic congestive heart failure, NYHA class 2     in 2013, EF was 20% - normal cardiac catheterization and cardiac MRI; follow-up echocardiogram and 14 with EF of 50%  . Asthma   . Thyroid disease   . Depression   . Anxiety   . Essential hypertension   . Stomach problems   . Hypertension   . Congenital heart disease, adult     status post repair in childhood -- was a failure to thrive child status post Cardec surgery ( unknown details))    Past Surgical History  Procedure Laterality Date  . Cholecystectomy  2013  . Cardiac catheterization      ZOX:WRUEAVW:UJROS:General:no colds or fevers, no weight changes Skin:no rashes or ulcers HEENT:no blurred vision, no congestion CV:see HPI PUL:see HPI GI:no diarrhea constipation or melena, no indigestion GU:no hematuria, no dysuria MS:no joint pain, no claudication Neuro:no syncope, no lightheadedness Endo:no diabetes, no thyroid disease  Wt Readings from Last 3 Encounters:  03/18/14 197 lb (89.359 kg)  03/08/14 196 lb 11.2 oz (89.223 kg)  01/28/14 196 lb 6.4 oz (89.086 kg)    PHYSICAL EXAM BP 147/86 mmHg  Pulse 93  Ht 5' (1.524 m)  Wt 197 lb (89.359 kg)  BMI 38.47 kg/m2 General:Pleasant affect, NAD Skin:Warm and dry, brisk capillary refill HEENT:normocephalic, sclera clear, mucus membranes moist Neck:supple, no JVD, no bruits  Heart:S1S2 RRR without murmur, gallup, rub or click Lungs:clear without rales, rhonchi, or wheezes WJX:BJYNAbd:soft, non tender, + BS, do not palpate liver spleen or masses Ext:no lower ext edema, 2+ pedal pulses, 2+ radial pulses Neuro:alert and oriented, MAE, follows commands, + facial symmetry   ASSESSMENT AND PLAN Acute diastolic heart failure Improving, Echo is stable when [facility]  [facility].  I discussed with Dr. Ranae Palms. Harding.  We increased her hydralazine to 37.5 TID and increased lasix to 60 mg daily.   She will follow up with Dr. Ranae Palms. Harding in 4 weeks.  If any problems in the mean time she will call.  Chronic diastolic heart failure See above  Congenital heart disease, adult Stable by MRI see records.

## 2014-03-18 NOTE — Patient Instructions (Signed)
Your physician recommends that you schedule a follow-up appointment in: 4 Weeks with Dr Herbie Baltimore  Your physician has recommended you make the following change in your medication: Increase Furosemide to 1 1/2 tablet daily and Hydralazine to 1 1/2 tablets threes time daily

## 2014-03-21 NOTE — Assessment & Plan Note (Signed)
See above

## 2014-03-21 NOTE — Assessment & Plan Note (Addendum)
Improving, Echo is stable when compared to records from [facility].  I discussed with Dr. Ranae Palms.  We increased her hydralazine to 37.5 TID and increased lasix to 60 mg daily.   She will follow up with Dr. Ranae Palms in 4 weeks.  If any problems in the mean time she will call.

## 2014-03-21 NOTE — Assessment & Plan Note (Signed)
Stable by MRI see records.

## 2014-03-22 ENCOUNTER — Telehealth: Payer: Self-pay | Admitting: *Deleted

## 2014-03-22 NOTE — Telephone Encounter (Signed)
-----   Message from Marykay Lex, MD sent at 03/22/2014 11:39 AM EST ----- Labs look good.  HARDING,DAVID W

## 2014-03-22 NOTE — Telephone Encounter (Signed)
Left message on voicemail of lab results,  Labs are good. Release to [facility] chart

## 2014-03-29 NOTE — Progress Notes (Signed)
Quick Note:  I think this is the last of the studies from [facility], Cath, Cardiac MRI ______

## 2014-04-12 ENCOUNTER — Other Ambulatory Visit (INDEPENDENT_AMBULATORY_CARE_PROVIDER_SITE_OTHER): Payer: Commercial Managed Care - PPO

## 2014-04-12 ENCOUNTER — Ambulatory Visit (INDEPENDENT_AMBULATORY_CARE_PROVIDER_SITE_OTHER): Payer: Commercial Managed Care - PPO | Admitting: Internal Medicine

## 2014-04-12 ENCOUNTER — Encounter: Payer: Self-pay | Admitting: Internal Medicine

## 2014-04-12 ENCOUNTER — Other Ambulatory Visit: Payer: Commercial Managed Care - PPO

## 2014-04-12 VITALS — BP 126/88 | HR 101 | Temp 98.1°F | Resp 16 | Ht 60.0 in | Wt 202.0 lb

## 2014-04-12 DIAGNOSIS — E038 Other specified hypothyroidism: Secondary | ICD-10-CM

## 2014-04-12 DIAGNOSIS — R5383 Other fatigue: Secondary | ICD-10-CM

## 2014-04-12 DIAGNOSIS — I5032 Chronic diastolic (congestive) heart failure: Secondary | ICD-10-CM

## 2014-04-12 LAB — VITAMIN B12: VITAMIN B 12: 179 pg/mL — AB (ref 211–911)

## 2014-04-12 LAB — CBC
HEMATOCRIT: 37.7 % (ref 36.0–46.0)
HEMOGLOBIN: 12.4 g/dL (ref 12.0–15.0)
MCHC: 32.7 g/dL (ref 30.0–36.0)
MCV: 88.1 fl (ref 78.0–100.0)
PLATELETS: 371 10*3/uL (ref 150.0–400.0)
RBC: 4.28 Mil/uL (ref 3.87–5.11)
RDW: 13.8 % (ref 11.5–15.5)
WBC: 14.1 10*3/uL — AB (ref 4.0–10.5)

## 2014-04-12 LAB — TSH: TSH: 2.62 u[IU]/mL (ref 0.35–4.50)

## 2014-04-12 LAB — BASIC METABOLIC PANEL
BUN: 20 mg/dL (ref 6–23)
CHLORIDE: 104 meq/L (ref 96–112)
CO2: 27 mEq/L (ref 19–32)
Calcium: 8.6 mg/dL (ref 8.4–10.5)
Creatinine, Ser: 0.8 mg/dL (ref 0.4–1.2)
GFR: 82.41 mL/min (ref >60.00)
Glucose, Bld: 103 mg/dL — ABNORMAL HIGH (ref 70–99)
POTASSIUM: 3.8 meq/L (ref 3.5–5.1)
SODIUM: 139 meq/L (ref 135–145)

## 2014-04-12 LAB — T4, FREE: Free T4: 0.87 ng/dL (ref 0.60–1.60)

## 2014-04-12 NOTE — Patient Instructions (Signed)
We will check on your blood work today to make sure that all of your electrolytes are normal. We will call you back with your results.  We will send a note to your cardiologist to see if cardiac rehabilitation might be appropriate.  We will see back in about 3-4 months. If you're having worsening problems breathing you can always call our office if you're unable to get a hold of her cardiologist.  Serving Sizes What we call a serving size today is larger than it was in the past. A 1950s fast-food burger contained little more than 1 oz of meat, and a soft drink was 8 oz (1 cup). Today, a "quarter pounder" burger is at least 4 times that amount, and a 32 or 64 oz drink is not uncommon. A possible guide for eating when trying to lose weight is to eat about half as much as you normally do. Some estimates of serving sizes are:  1 Dairy serving:Individual container of yogurt (8 oz) or piece of cheese the size of your thumb (1 oz).  1 Grain serving: 1 slice of bread or  cup pasta.  1 Meat serving: The size of a deck of cards (3 oz).  1 Fruit serving: cup canned fruit or 1 medium fruit.  1 Vegetable serving:  cup of cooked or canned vegetables.  1 Fat serving:The size of 4 stacked dimes. Experts suggest spending 1 or 2 days measuring food portions you commonly eat. This will give you better practice at estimating serving sizes, and will also show whether you are eating an appropriate amount of food to meet your weight goals. If you find that you are eating more than you thought, try measuring your food for a few days so you can "reprogram" yourself to learn what makes a healthy portion for you. SUGGESTIONS FOR CONTROL  In restaurants, share entrees, or ask the waiter to put half the entre in a box or bag before you even touch it.  Order lunch-sized portions. Many restaurants serve 4 to 6 oz of meat at lunch, compared with 8 to 10 oz at dinner.  Split dessert or skip it all together. Have a  piece of fruit when you get home.  At home, use smaller plates and bowls. It will look as if you are eating more.  Plate your food in the kitchen rather than serving it "family style" at the table.  Wait 20 to 30 minutes before taking seconds. This is how long it takes your brain to recognize that you are full.  Check food labels for serving sizes. Eat 1 serving only.  Use measuring cups and spoons to see proper serving sizes.  Buy smaller packages of candy, popcorn, and snacks.  Avoid eating directly out of the bag or carton.  While eating half as much, exercise twice as much. Park further away from the mall, take the stairs instead of the escalator, and walk around your block. Losing weight is a slow, difficult process. It takes long-lasting lifestyle changes. You can make gradual changes over time so they become habits. Look to friends and family to support the healthy changes you are making. Avoid fad diets since they are often only temporary weight loss solutions. Document Released: 01/20/2003 Document Revised: 07/16/2011 Document Reviewed: 07/21/2013 [facility] Patient Information 2015 [place], [place]. This information is not intended to replace advice given to you by your health care provider. Make sure you discuss any questions you have with your health care provider.

## 2014-04-12 NOTE — Assessment & Plan Note (Signed)
Patient has been taking more diuretics since last visit however is up on weight. She states some dietary indiscretions over the holidays. Not much appreciable fluid on exam. Talked with her about diet and exercise and feels she may benefit from cardiac rehabilitation.

## 2014-04-12 NOTE — Assessment & Plan Note (Signed)
Recheck TSH and free T4. She was restarted on 50 g Synthroid daily at last visit.

## 2014-04-12 NOTE — Progress Notes (Signed)
   Subjective:    Patient ID: Meredith Diaz, female    DOB: [birth year], 33 y.o.   MRN: [id number]  HPI The patient is a 33 female comes in today to follow-up on her thyroid. Since our last visit she spent a cardiologist several times and they've been adjusting her diuretics. She states that even with this adjustment she still feels shortness of breath and that she is not getting much fluid. She has also been having some muscle cramps since they've been adjusting diuretics and wonders if she has some kind of nutritional deficiency. She denies any chest pains, abdominal pains, diarrhea, constipation.  Review of Systems  Constitutional: Negative for fever, activity change, appetite change and fatigue.  HENT: Negative.   Eyes: Negative.   Respiratory: Positive for shortness of breath. Negative for cough, chest tightness and wheezing.   Cardiovascular: Negative for chest pain and palpitations.  Gastrointestinal: Negative for abdominal pain, diarrhea, constipation and abdominal distention.  Endocrine: Negative.   Genitourinary: Negative.   Musculoskeletal: Negative.   Neurological: Negative.       Objective:   Physical Exam  Constitutional: She is oriented to person, place, and time. She appears well-developed and well-nourished. No distress.  HENT:  Head: Normocephalic and atraumatic.  Eyes: EOM are normal.  Neck: Normal range of motion.  Cardiovascular: Normal rate and regular rhythm.   Pulmonary/Chest: Effort normal and breath sounds normal. No respiratory distress. She has no wheezes. She has no rales.  Abdominal: Soft. Bowel sounds are normal. She exhibits no distension. There is no tenderness. There is no rebound.  Musculoskeletal: She exhibits edema.  1+ pedal edema bilateral, not in the legs.   Neurological: She is alert and oriented to person, place, and time. Coordination normal.  Skin: Skin is warm and dry.   Filed Vitals:   04/12/14 0813  BP: 126/88  Pulse: 101  Temp:  98.1 F (36.7 C)  TempSrc: Oral  Resp: 16  Height: 5' (1.524 m)  Weight: 202 lb (91.627 kg)  SpO2: 91%      Assessment & Plan:

## 2014-04-13 MED ORDER — VITAMIN B-12 1000 MCG PO TABS: 1000.0000 ug | ORAL_TABLET | Freq: Every day | ORAL | Status: DC

## 2014-04-13 NOTE — Addendum Note (Signed)
Addended by: Genella Mech A on: 04/13/2014 10:31 AM   Modules accepted: Orders

## 2014-04-27 ENCOUNTER — Encounter: Payer: Self-pay | Admitting: Cardiology

## 2014-04-27 ENCOUNTER — Ambulatory Visit (INDEPENDENT_AMBULATORY_CARE_PROVIDER_SITE_OTHER): Payer: Commercial Managed Care - PPO | Admitting: Cardiology

## 2014-04-27 VITALS — BP 118/78 | HR 88 | Ht 60.0 in | Wt 197.7 lb

## 2014-04-27 DIAGNOSIS — E669 Obesity, unspecified: Secondary | ICD-10-CM

## 2014-04-27 DIAGNOSIS — I5042 Chronic combined systolic (congestive) and diastolic (congestive) heart failure: Secondary | ICD-10-CM

## 2014-04-27 DIAGNOSIS — I429 Cardiomyopathy, unspecified: Secondary | ICD-10-CM

## 2014-04-27 DIAGNOSIS — I10 Essential (primary) hypertension: Secondary | ICD-10-CM

## 2014-04-27 DIAGNOSIS — Q249 Congenital malformation of heart, unspecified: Secondary | ICD-10-CM

## 2014-04-27 NOTE — Patient Instructions (Signed)
YOU MAY ADJUST LASIX (furosemide) as needed by your weight. The weight today is  dry weight.  Continue with current medications.  Your physician wants you to follow-up in 6 month Dr Herbie Baltimore- 30 MIN APPT.  You will receive a reminder letter in the mail two months in advance. If you don't receive a letter, please call our office to schedule the follow-up appointment.

## 2014-04-28 ENCOUNTER — Encounter: Payer: Self-pay | Admitting: Cardiology

## 2014-04-28 DIAGNOSIS — I429 Cardiomyopathy, unspecified: Secondary | ICD-10-CM | POA: Insufficient documentation

## 2014-04-28 NOTE — Assessment & Plan Note (Signed)
EF of 40-45% is a little bit lower than what her previous echo had been. I'm not sure to make of the hypertrophic cardio myopathy consideration because the MRI did not suggest that. She is on a good dose of Toprol. Her blood pressure stable some not going to titrate that to 50 mg as initially dissipated. She is also on hydralazine for afterload reduction. Not on an ACE inhibitor or ARB due to history of angioedema with ACE inhibitor.  She is on Lasix which I recommend that she does on a sliding scale adjusting based on her symptoms and her weight.

## 2014-04-28 NOTE — Assessment & Plan Note (Signed)
She put on quite a bit of weight from the 180s into the 190s now. Her baseline weight probably needs to be down about 150. Part of her dyspnea is related to her carrying extra weight. We discussed the importance of dietary modification and exercise.

## 2014-04-28 NOTE — Progress Notes (Signed)
PATIENT: Meredith Diaz MRN: [id number] DOB: 08/06/1980 PCP: Judie BonusKollar, Elizabeth A, MD  Clinic Note: Chief Complaint  Patient presents with  . Follow-up    4 week follow up, Pt. experiencing SOB and swelling, Pt. denies any chest pressure and pain.   HPI: Meredith Diaz is a 33 y.o. female with a PMH of reported congenital heart disease status post surgical repair as a child child but with no major problems until 2013 when she was minimally to the hospital with a diagnosis of CHF and ejection fraction of roughly 25-30 %. She had about a month in the hospital.  Her heart failure was evaluated with cardiac catheterization and cardiac MRI with no gross abnormalities.  He has recently moved to [place], [place] from [place] -- we haven't received records from [place], but still did not have records from her childhood operations. I saw her initially in early November. She then saw Nada BoozerLaura Ingold, NP for blood pressure and dyspnea.  Interval History: She actually is feeling a bit better today than she had been. Her blood pressure is much better. Her edema is better. Her weight is back down from what was in early December when she saw Vernona RiegerLaura. She is back down to the 197 pounds. She still has exertional dyspnea but not as much PND and orthopnea. No chest tightness or pressure with rest or exertion. The one thing she is concerned about is that her urine output has not been as brisk as usually sees it with her 40 mg to 60 mg a day. She has been using Zaroxolyn about 4 days a week.  Occasional, but less frequent palpitations but no arrhythmias noted. No syncope/near-syncope or TIA/amaurosis fugax symptoms. She has had some dizziness spells - but these are better since her blood pressure is improved.  Past Medical History  Diagnosis Date  . Chronic diastolic congestive heart failure, NYHA class 2 07/2011    Echo: EF was 25-30%; No Sig CAD on CATH; No significant finding on Cardiac MRI; follow-up  Ech0 2014 --  EF of 50%  . Asthma   . Thyroid disease   . Depression   . Anxiety   . Essential hypertension   . Stomach problems   . Hypertension   . 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 Cardiomyopathy     Unclear etiology. 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 Cholecystectomy  2013  . Cardiac catheterization  08/2011    Normal Coronaries - LVEDP 32 mmHg  . Transthoracic echocardiogram  07/2011    Moderate concentric LVH, mildly dilated. EF 25-30% no diastolic dysfunction parameters to suggest elevated LAP  . Cardiac mri  April 2013    No suggestion of a congenital abnormality; EF ~ 52% - no regional wall motion abnormalities., no increased myocardial signal intensity. No perfusion abnormality.  . Transthoracic echocardiogram  December 2015    Last EF by echo 40-45% with global hypokinesis, mild LVH possible apical variant hypertrophic cardiomyopathy     Allergies  Allergen Reactions  . Lisinopril Swelling    Angioedema facial    Current Outpatient Prescriptions  Medication Sig Dispense Refill  . albuterol (  VENTOLIN HFA) 108 (90 BASE) MCG/ACT inhaler Inhale 2 puffs into the lungs every 6 (six) hours as needed. 6.7 g 12  . budesonide-formoterol (SYMBICORT) 160-4.5 MCG/ACT inhaler Inhale 2 puffs into the lungs daily. 1 Inhaler 12  . dicyclomine (BENTYL) 20 MG tablet Take 1 tablet (20 mg total) by mouth as needed for spasms. 60 tablet 3  . furosemide (LASIX) 40 MG tablet Take 1.5 tablets (60 mg total) by mouth daily. 45 tablet 6  . hydrALAZINE (APRESOLINE) 25 MG tablet Take 1.5 tablets (37.5 mg total) by mouth 3 (three) times daily. (Patient  taking differently: Take 37.5 mg by mouth 3 (three) times daily. Take 1 tablet (25mg ) PO three times daily.) 135 tablet 6  . levothyroxine (SYNTHROID, LEVOTHROID) 50 MCG tablet Take 1 tablet (50 mcg total) by mouth daily before breakfast. 30 tablet 3  . metolazone (ZAROXOLYN) 2.5 MG tablet Take 1 tablet (2.5 mg total) by mouth daily. 30 tablet 6  . metoprolol succinate (TOPROL XL) 25 MG 24 hr tablet Take 1 tablet (25 mg total) by mouth daily. 30 tablet 11  . norethindrone-ethinyl estradiol 1/35 (ALAYCEN 1/35) tablet Take 1 tablet by mouth daily. 1 Package 12  . vitamin B-12 (CYANOCOBALAMIN) 1000 MCG tablet Take 1 tablet (1,000 mcg total) by mouth daily. 30 tablet 6   No current facility-administered medications for this visit.   No Significant Change to Family and Social History  ROS: A comprehensive Review of Systems - was performed Review of Systems  Constitutional: Negative for malaise/fatigue and diaphoresis.       Back down to a more baseline weight.  HENT: Negative for nosebleeds.   Respiratory: Positive for shortness of breath. Negative for cough, hemoptysis and wheezing.   Cardiovascular: Positive for palpitations (Intermittent. Rare) and orthopnea (Notably improved). Negative for chest pain, claudication, leg swelling and PND.  Gastrointestinal: Negative for blood in stool and melena.  Genitourinary: Negative for hematuria.  Musculoskeletal: Negative.   Neurological: Positive for dizziness. Negative for tremors, sensory change, speech change, focal weakness, seizures and loss of consciousness.  Endo/Heme/Allergies: Does not bruise/bleed easily.  Psychiatric/Behavioral: Positive for depression. The patient is nervous/anxious.   All other systems reviewed and are negative.  PHYSICAL EXAM BP 118/78 mmHg  Pulse 88  Ht 5' (1.524 m)  Wt 197 lb 11.2 oz (89.676 kg)  BMI 38.61 kg/m2 Physical Exam  Constitutional: She is oriented to person, place, and time. She appears well-developed  and well-nourished. No distress.  HENT:  Head: Normocephalic and atraumatic.  Mouth/Throat: Oropharynx is clear and moist. No oropharyngeal exudate.  Eyes: Conjunctivae and EOM are normal. Pupils are equal, round, and reactive to light. No scleral icterus.  Neck: Normal range of motion. Neck supple. No JVD present. No tracheal deviation present.  Cardiovascular: Normal rate, regular rhythm and normal heart sounds.   Occasional extrasystoles are present. PMI is not displaced.  Exam reveals no gallop and no decreased pulses.   No murmur heard. Pulmonary/Chest: Effort normal and breath sounds normal. No respiratory distress. She has no wheezes. She has no rales. She exhibits no tenderness.  Abdominal: Soft. Bowel sounds are normal. She exhibits no distension and no mass. There is no tenderness.  Musculoskeletal: Normal range of motion. She exhibits no edema or tenderness.  Trace, minimal edema  Neurological: She is alert and oriented to person, place, and time. No cranial nerve deficit.  Skin: Skin is warm and dry. No rash noted. She is not diaphoretic. No erythema.  Psychiatric: She has a  normal mood and affect. Her behavior is normal. Judgment and thought content normal.  A bit anxious    Adult ECG Report -not checked.  Recent Labs:     Chemistry      Component Value Date/Time   NA 139 04/12/2014 0839   K 3.8 04/12/2014 0839   CL 104 04/12/2014 0839   CO2 27 04/12/2014 0839   BUN 20 04/12/2014 0839   CREATININE 0.8 04/12/2014 0839   CREATININE 0.86 03/15/2014 0918      Component Value Date/Time   CALCIUM 8.6 04/12/2014 0839      ASSESSMENT / PLAN: 33 year old young woman with prior history of CHF supposedly improved after medical therapy with moderately reduced EF. Likely has diastolic dysfunction with possible hypertrophic cardiomyopathy. This was not seen on MRI so I don't think that this is a true diagnosis. Overall her heart failure symptoms seem to be  improved.  Cardiomyopathy EF of 40-45% is a little bit lower than what her previous echo had been. I'm not sure to make of the hypertrophic cardio myopathy consideration because the MRI did not suggest that. She is on a good dose of Toprol. Her blood pressure stable some not going to titrate that to 50 mg as initially dissipated. She is also on hydralazine for afterload reduction. Not on an ACE inhibitor or ARB due to history of angioedema with ACE inhibitor.  She is on Lasix which I recommend that she does on a sliding scale adjusting based on her symptoms and her weight.  Congenital heart disease, adult Not sure what this was. MRI look relatively benign. Echo suggests possible hypertrophic cardio myopathy but this was not seen on MRI either.  Obesity (BMI 30-39.9) She put on quite a bit of weight from the 180s into the 190s now. Her baseline weight probably needs to be down about 150. Part of her dyspnea is related to her carrying extra weight. We discussed the importance of dietary modification and exercise.  Essential hypertension Currently stable blood pressure. I would like to develop increase her Toprol, but with current blood pressure I'm inclined to leave alone.  Chronic combined systolic and diastolic heart failure, NYHA class 2 On stable medications. She is on hydralazine plus Toprol. Takes Lasix 60 mg daily. Will intermittently do 40 twice a day. Also intermittently uses Zaroxolyn. She is hoping to try to be losing weight and therefore we will have to adjust her dry weight accordingly. She is notably improved from a symptom standpoint with a 5 pound weight reduction since earlier December.    No orders of the defined types were placed in this encounter.   No orders of the defined types were placed in this encounter.    Followup: 2 weeks with Mid-Level Practitioner    Marykay Lex, M.D., M.S. Interventional Cardiologist   Pager # [phone]

## 2014-04-28 NOTE — Assessment & Plan Note (Signed)
Currently stable blood pressure. I would like to develop increase her Toprol, but with current blood pressure I'm inclined to leave alone.

## 2014-04-28 NOTE — Assessment & Plan Note (Signed)
Not sure what this was. MRI look relatively benign. Echo suggests possible hypertrophic cardio myopathy but this was not seen on MRI either.

## 2014-04-28 NOTE — Assessment & Plan Note (Signed)
On stable medications. She is on hydralazine plus Toprol. Takes Lasix 60 mg daily. Will intermittently do 40 twice a day. Also intermittently uses Zaroxolyn. She is hoping to try to be losing weight and therefore we will have to adjust her dry weight accordingly. She is notably improved from a symptom standpoint with a 5 pound weight reduction since earlier December.

## 2014-08-03 ENCOUNTER — Other Ambulatory Visit (INDEPENDENT_AMBULATORY_CARE_PROVIDER_SITE_OTHER): Payer: Commercial Managed Care - PPO

## 2014-08-03 ENCOUNTER — Ambulatory Visit (INDEPENDENT_AMBULATORY_CARE_PROVIDER_SITE_OTHER): Payer: Commercial Managed Care - PPO | Admitting: Internal Medicine

## 2014-08-03 ENCOUNTER — Encounter: Payer: Self-pay | Admitting: Internal Medicine

## 2014-08-03 VITALS — BP 140/82 | HR 87 | Temp 97.8°F | Resp 18 | Wt 202.0 lb

## 2014-08-03 DIAGNOSIS — E538 Deficiency of other specified B group vitamins: Secondary | ICD-10-CM

## 2014-08-03 DIAGNOSIS — I5042 Chronic combined systolic (congestive) and diastolic (congestive) heart failure: Secondary | ICD-10-CM

## 2014-08-03 DIAGNOSIS — J4530 Mild persistent asthma, uncomplicated: Secondary | ICD-10-CM

## 2014-08-03 DIAGNOSIS — E039 Hypothyroidism, unspecified: Secondary | ICD-10-CM

## 2014-08-03 LAB — BASIC METABOLIC PANEL
BUN: 12 mg/dL (ref 6–23)
CALCIUM: 9.2 mg/dL (ref 8.4–10.5)
CO2: 29 meq/L (ref 19–32)
CREATININE: 0.85 mg/dL (ref 0.40–1.20)
Chloride: 98 mEq/L (ref 96–112)
GFR: 81.14 mL/min (ref >60.00)
GLUCOSE: 92 mg/dL (ref 70–99)
POTASSIUM: 4.3 meq/L (ref 3.5–5.1)
SODIUM: 135 meq/L (ref 135–145)

## 2014-08-03 LAB — VITAMIN B12: VITAMIN B 12: 248 pg/mL (ref 211–911)

## 2014-08-03 NOTE — Patient Instructions (Signed)
We will have you talk to a lung doctor to see if they agree that likely the breathing problems are not coming from the asthma. I will also send a note to the heart doctor to let them know that we do not think the asthma is causing the feelings of breathlessness in you.   We will see you back in about 4 months to check on the thyroid this summer. If you are going up in weight please call our office or the cardiologist so we can adjust the medicines for your fluid.

## 2014-08-03 NOTE — Progress Notes (Signed)
Pre visit review using our clinic review tool, if applicable. No additional management support is needed unless otherwise documented below in the visit note. 

## 2014-08-05 NOTE — Assessment & Plan Note (Signed)
Checked thyroid tests last time and stable. Will recheck in about 3 months and adjust her synthroid as needed.

## 2014-08-05 NOTE — Assessment & Plan Note (Addendum)
Controlled at this time. She is up weight since last visit and suspect that some of that is true weight gain as she has no signs of fluid overload on exam. No change to her medications today. Still using lasix daily, hydralazine, metoprolol and metolazone for her heart failure.

## 2014-08-05 NOTE — Progress Notes (Signed)
   Subjective:    Patient ID: Meredith Diaz, female    DOB: Nov 15, 1980, 34 y.o.   MRN: [id number]  HPI The patient is a 34 YO female who is coming in to follow up on her thyroid problems (stable on synthroid, no complications), her diastolic heart failure (stable weights, some dyspnea with exertion, no complications), and her asthma (taking symbicort and using albuterol quite frequently). Her biggest concern is what is causing her dyspnea on exertion. She is using her albuterol more times per week lately as she feels she cannot breathe. Her weights are not increasing although she is up weight from 1 year ago (weight gain) by about 20-30 pounds. She felt much better at the lower weight. She is not sure that the albuterol really works and does not feel the symbicort does anything.   Review of Systems  Constitutional: Negative for fever, activity change, appetite change and fatigue.  HENT: Negative.   Eyes: Negative.   Respiratory: Positive for shortness of breath. Negative for cough, chest tightness and wheezing.   Cardiovascular: Negative for chest pain and palpitations.  Gastrointestinal: Negative for abdominal pain, diarrhea, constipation and abdominal distention.  Endocrine: Negative.   Genitourinary: Negative.   Musculoskeletal: Negative.   Neurological: Negative.       Objective:   Physical Exam  Constitutional: She is oriented to person, place, and time. She appears well-developed and well-nourished. No distress.  Overweight  HENT:  Head: Normocephalic and atraumatic.  Eyes: EOM are normal.  Neck: Normal range of motion.  Cardiovascular: Normal rate and regular rhythm.   Pulmonary/Chest: Effort normal and breath sounds normal. No respiratory distress. She has no wheezes. She has no rales.  Abdominal: Soft. Bowel sounds are normal. She exhibits no distension. There is no tenderness. There is no rebound.  Neurological: She is alert and oriented to person, place, and time.  Coordination normal.  Skin: Skin is warm and dry.   Filed Vitals:   08/03/14 0903  BP: 140/82  Pulse: 87  Temp: 97.8 F (36.6 C)  TempSrc: Oral  Resp: 18  Weight: 202 lb (91.627 kg)  SpO2: 97%      Assessment & Plan:

## 2014-08-05 NOTE — Assessment & Plan Note (Signed)
I do not think her asthma is the cause of the breathing difficulty as her lungs are perfectly clear on exam. Will refer to pulmonology for second opinion but it is unclear to me if she even has asthma or whether her heart and weight problems explain all of her breathing difficulties. Using albuterol multiple times per week when she feels her lungs cannot expand well but does not get much relief of the symptoms. If she has forgotten her symbicort does not notice any difference in symptoms.

## 2014-08-16 ENCOUNTER — Encounter (INDEPENDENT_AMBULATORY_CARE_PROVIDER_SITE_OTHER): Payer: Self-pay

## 2014-08-16 ENCOUNTER — Encounter: Payer: Self-pay | Admitting: Pulmonary Disease

## 2014-08-16 ENCOUNTER — Ambulatory Visit (INDEPENDENT_AMBULATORY_CARE_PROVIDER_SITE_OTHER)
Admission: RE | Admit: 2014-08-16 | Discharge: 2014-08-16 | Disposition: A | Discharge status: Home / Self Care | Payer: Commercial Managed Care - PPO | Source: Ambulatory Visit | Attending: Pulmonary Disease | Admitting: Pulmonary Disease

## 2014-08-16 ENCOUNTER — Ambulatory Visit (INDEPENDENT_AMBULATORY_CARE_PROVIDER_SITE_OTHER): Payer: Commercial Managed Care - PPO | Admitting: Pulmonary Disease

## 2014-08-16 VITALS — BP 142/90 | HR 104 | Temp 98.6°F | Ht 60.0 in | Wt 203.0 lb

## 2014-08-16 DIAGNOSIS — J4530 Mild persistent asthma, uncomplicated: Secondary | ICD-10-CM

## 2014-08-16 DIAGNOSIS — R0600 Dyspnea, unspecified: Secondary | ICD-10-CM

## 2014-08-16 DIAGNOSIS — I429 Cardiomyopathy, unspecified: Secondary | ICD-10-CM

## 2014-08-16 DIAGNOSIS — G478 Other sleep disorders: Secondary | ICD-10-CM

## 2014-08-16 DIAGNOSIS — J984 Other disorders of lung: Secondary | ICD-10-CM | POA: Insufficient documentation

## 2014-08-16 DIAGNOSIS — G4730 Sleep apnea, unspecified: Secondary | ICD-10-CM

## 2014-08-16 DIAGNOSIS — I5042 Chronic combined systolic (congestive) and diastolic (congestive) heart failure: Secondary | ICD-10-CM

## 2014-08-16 DIAGNOSIS — I10 Essential (primary) hypertension: Secondary | ICD-10-CM

## 2014-08-16 DIAGNOSIS — E669 Obesity, unspecified: Secondary | ICD-10-CM

## 2014-08-16 MED ORDER — CLONAZEPAM 0.5 MG PO TABS: ORAL_TABLET | ORAL | Status: DC

## 2014-08-16 NOTE — Patient Instructions (Signed)
It was nice meeting you today...  We have initiated a work up for your shortness of breath, AND your sleep related issues...  Continue your current meds the same for now...    We added one med at this time> KLONOPIN 0.5mg  tabs as a chest wall muscle relaxer>>       Take 1/2 to 1 tab twice daily (start w/ 1/2 tab twice daily & increase to 1 tab twice daily if needed)...  We will schedule Full PFTs and a sleep study ASAP & we will plan a recheck when these are done...  Call for any questions...  You may continue your gradual exercise program..[name]

## 2014-08-16 NOTE — Progress Notes (Signed)
Subjective:     Patient ID: Meredith Diaz, female   DOB: [birth year], 34 y.o.   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, Programmer, multimedia for [employer], pt of DrKollar & referred for pulmonary evaluation w/ hx asthma- she notes increased DOE & no relief from her inhalers> Meredith Diaz is a never smoker & was diagnosed w/ asthma first in 42 at age 34 or so when [facility] for pneumonia (Cariology said she was hosp for CHF w/ EF=25-30% and was in hosp for 57mo) & she's had trouble ever since then, disch on NEBS;  Treated over the yrs w/ MDIs, Symbicort/Advair (the latter didn't help), last had Pred before 2013 hosp;  She was allergy tested in middle school & essentially neg x mild grass reaction by her memory;  She gets "sinus infections" a couple of times each yr (she thinks this is her main trigger) & uses Claritin/ Flonase prn;  Symptoms included SOB/ DOE w/ min activ, feels like she can't get a deep breath/ can't get enough air "in"/ not satisfied breathing (states this sensation is present all the time/constant, & she notes "gasping"); states she notes some wheezing when supine at night & some voice issues (?VCD, tight burning in throat) but denies reflux/ dysphagia/ nocturnal cough or choking (note- she has IBS & s/pGB 2013)... She also c/o sleep issues> can't sleep well while lying down, husb c/o her "wheezing"/ some snoring (but does not indicate that she stops breathing)/ restless sleeper/ leg jerks; she does have some daytime sleepiness issues w/ incr fatigue at work and she'll take naps on weekends; ESS=12... Current Meds> Symbicort160- 2puffs daily, AlbutHFA for prn use...      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 Cumulative data from cardiac evals as follows, she was told her cardiac issues are ?congenital, ?virus damaged her heart>>  she has never had cardiac rehab etc...   CXR 2013 at [facility] showed cardiomegaly, biapical pleural thickening, otherw clear & NAD...  Cardiac MRI from [facility] in 2013 showed EF=52% w/ mild global HK, min AI, RV- wnl, Ao- wnl, mild pectus excavqatum w/ deviation of cardiac apex...  Cath was done in 2013 but I cannot find that report (said no signif CAD)...  EKG 11/15 showed STachy, rate 103, ?LAE, suggestive LVH, NSSTTWA...   2DEcho 11/15 by DrHarding showed mild LVH, ?poss apical variant hypertrophic cardiomyopathy, EF=40-45% w/ diffuse HK, Gr1DD, MV- mildly thickened leaflets and trivMR, norm LA size...   Current Meds> Metoprolol-ER25mg /d, Apresoline25-1.5tabsTid, Lasix40-1.5tabs/d, Zaroxyln2.5mg /d  She has Hx obesity> currently 203# (this is her max wt she says), 5' tall, BMI=39.6; she states that she's lost weight in the past & that she felt much better when her weight was down... She is allergic to ACE inhibitors w/ prev angioedema...   EXAM reveals Afeb, VSS, O2sat=93% on RA at rest;  HEENT- neg, mallampati 2, voice sl hoarse;  Chest- decr BS bilat, clear, decr diaph excursions by percussion, freq sign breaths;  Heart- RRR w/p m/r/g;  Ext- no c/c/e...  Recent LABS> Chems- wnl;  CBC- ok w/ Hg=12.4, WBC=14K;  TSH=2.62;  B12 level= 180-250 (on oral B12- 1041mcg/d)...  CXR 08/16/14 showed norm heart size, clear lungs, NAD...  Spirometry 08/16/14> poor effort (breathing "high" in her lung volumes)> FVC=0.84 (27%), FEV1=0.66 (24%), %1sec=78, mid-flows reduced at 18% predicted; Test indicated severe  restriction w/ small airways dis...   Ambulatory oxygen saturations> O2sat=95% on RA at rest w/ pulse=89/min;  She walked 2 laps and stopped due to SOB- O2sat=83% w/ pulse 121/min...  FENO= 10ppb (results <25ppd implies absence of eosinophilic airway inflammation).  Epworth Sleepiness Score= 12 (indicating that she may be excessively sleepy depending on the situation...  IMP/PLAN>>  Meredith Diaz has been  diagnosed w/ asthma over the last 20+yrs but her current symptoms and spirometry looks more restricted- prob due to "chest wall factors" as we discussed;  She has additional issues from obesity- r/o OSA, and from her Cardiac disease- hx congenital HD w/ surg, HBP, cardiomyopathy w/ combined sys/diast CHF- followed & treated by Cards, DrHarding... We discussed the nature of her dyspnea and the need for additional testing and a trial of KLONOPIN 0.5mg  Bid... We will sched FullPFTs and a Sleep Study and have her return after that...    Past Medical History  Diagnosis Date  . Chronic diastolic congestive heart failure, NYHA class 2 07/2011    Echo: EF was 25-30%; No Sig CAD on CATH; No significant finding on Cardiac MRI; follow-up Ech0 2014 --  EF of 50%  . Asthma   . Thyroid disease  >>  On Synthroid50, TSH 12/15 = 2.62   . Depression   . Anxiety   . Essential hypertension   . Stomach problems   . Hypertension   . 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 Cardiomyopathy     Unclear etiology. Last EF by echo 40-45% with global hypokinesis, mild LVH possible apical variant hypertrophic cardiomyopathy        B12 Deficiency >> on oral B12 supplement 1018mcg/d;  Labs showed B12 level 178 => 248   Past Surgical History  Procedure Laterality Date  . Cholecystectomy  2013  . Cardiac catheterization  08/2011    Normal Coronaries - LVEDP 32 mmHg  . Transthoracic echocardiogram  07/2011    Moderate concentric LVH, mildly dilated. EF 25-30% no diastolic dysfunction parameters to suggest elevated LAP  . Cardiac mri  April 2013    No suggestion of a congenital abnormality; EF ~ 52% - no regional wall motion abnormalities., no increased myocardial signal intensity. No perfusion abnormality.  . Transthoracic echocardiogram  December 2015    Last EF  by echo 40-45% with global hypokinesis, mild LVH possible apical variant hypertrophic cardiomyopathy     Outpatient Encounter Prescriptions as of 08/16/2014  Medication Sig  . albuterol (VENTOLIN HFA) 108 (90 BASE) MCG/ACT inhaler Inhale 2 puffs into the lungs every 6 (six) hours as needed.  . budesonide-formoterol (SYMBICORT) 160-4.5 MCG/ACT inhaler Inhale 2 puffs into the lungs daily.  [name] dicyclomine (BENTYL) 20 MG tablet Take 1 tablet (20 mg total) by mouth as needed for spasms.  . furosemide (LASIX) 40 MG tablet Take 1.5 tablets (60 mg total) by mouth daily.  . hydrALAZINE (APRESOLINE) 25 MG tablet Take 1.5 tablets (37.5 mg total) by mouth 3 (three) times daily. (Patient taking differently: Take 37.5 mg by mouth 3 (three) times daily. Take 1 tablet (25mg ) PO three times daily.)  . levothyroxine (SYNTHROID, LEVOTHROID) 50 MCG tablet Take 1 tablet (50 mcg total) by mouth daily before breakfast.  . metolazone (ZAROXOLYN) 2.5 MG tablet Take 1 tablet (2.5 mg total) by mouth daily.  . metoprolol succinate (TOPROL  XL) 25 MG 24 hr tablet Take 1 tablet (25 mg total) by mouth daily.  . norethindrone-ethinyl estradiol 1/35 (ALAYCEN 1/35) tablet Take 1 tablet by mouth daily.  . vitamin B-12 (CYANOCOBALAMIN) 1000 MCG tablet Take 1 tablet (1,000 mcg total) by mouth daily.  . clobetasol cream (TEMOVATE) 0.05 % as needed.   Allergies  Allergen Reactions  . Lisinopril Swelling    Angioedema facial    Family History  Problem Relation Age of Onset  . Depression Mother   . Hypertension Father   . Heart disease Maternal Grandfather   . Heart disease Paternal Grandmother   . Breast cancer Paternal Grandmother   . Colon cancer Maternal Grandmother   . Asthma Paternal Grandmother    History   Social History  . Marital Status: Married    Spouse Name: N/A  . Number of Children: N/A  . Years of Education: N/A   Occupational History  . editor    Social History Main Topics  . Smoking status: Never  Smoker   . Smokeless tobacco: Not on file  . Alcohol Use: No  . Drug Use: No  . Sexual Activity: Not on file   Other Topics Concern  . Not on file   Social History Narrative   Current Medications, Allergies, Past Medical History, Past Surgical History, Family History, and Social History were reviewed in [employer] record.   Review of Systems             All symptoms NEG except where BOLDED >>  Constitutional:  Denies F/C/S, anorexia, unexpected weight change. HEENT:  No HA, visual changes, earache, nasal symptoms, sore throat, hoarseness. Resp:  cough, sputum, hemoptysis; SOB, tightness, wheezing. Cardio:  CP, palpit, DOE, orthopnea, edema. GI:  Denies N/V/D/C or blood in stool; no reflux, abd pain, distention, or gas. GU:  No dysuria, freq, urgency, hematuria, or flank pain. MS:  Denies joint pain, swelling, tenderness, or decr ROM; no neck pain, back pain, etc. Neuro:  No tremors, seizures, dizziness, syncope, weakness, numbness, gait abn. Skin:  No suspicious lesions or skin rash. Heme:  No adenopathy, bruising, bleeding. Psyche: Denies confusion, sleep disturbance, hallucinations, anxiety, depression.   Objective:   Physical Exam    Vital Signs:  Reviewed...  General:  WD, overweight, 34 y/o WF in NAD; alert & oriented; pleasant & cooperative... HEENT:  [place]/AT; Conjunctiva- pink, Sclera- nonicteric, EOM-wnl, PERRLA, EACs-clear, TMs-wnl; NOSE-clear; THROAT-clear & wnl. Neck:  Supple w/ full ROM; no JVD; normal carotid impulses w/o bruits; no thyromegaly or nodules palpated; no lymphadenopathy. Chest:  Clear to P & A; without wheezes, rales, or rhonchi heard (decr BS at bases) Heart:  Regular Rhythm; norm S1 & S2, gr1/6 SEM, without rubs or gallops detected. Abdomen:  Soft & nontender- no guarding or rebound; normal bowel sounds; no organomegaly or masses palpated. Ext:  Normal ROM; without deformities or arthritic changes; no varicose veins, +venous  insuffic, tr edema;  Pulses intact w/o bruits. Neuro:  CNs II-XII intact; motor testing normal; sensory testing normal; gait normal & balance OK. Derm:  No lesions noted; no rash etc. Lymph:  No cervical, supraclavicular, axillary, or inguinal adenopathy palpated.   Assessment:      IMP/PLAN>>  Meredith Diaz has been diagnosed w/ asthma over the last 20+yrs but her current symptoms and spirometry looks more restricted- prob due to "chest wall factors" as we discussed;  She has additional issues from obesity- r/o OSA, and from her Cardiac disease- hx congenital HD w/  surg, HBP, cardiomyopathy w/ combined sys/diast CHF- followed & treated by Cards, DrHarding... We discussed the nature of her dyspnea and the need for additional testing and a trial of KLONOPIN 0.5mg  Bid... We will sched FullPFTs and a Sleep Study and have her return after that...     Plan:     Patient's Medications  New Prescriptions   CLONAZEPAM (KLONOPIN) 0.5 MG TABLET    Take 1/2-1 tablet PO BID  Previous Medications   ALBUTEROL (VENTOLIN HFA) 108 (90 BASE) MCG/ACT INHALER    Inhale 2 puffs into the lungs every 6 (six) hours as needed.   BUDESONIDE-FORMOTEROL (SYMBICORT) 160-4.5 MCG/ACT INHALER    Inhale 2 puffs into the lungs daily.   CLOBETASOL CREAM (TEMOVATE) 0.05 %    as needed.   DICYCLOMINE (BENTYL) 20 MG TABLET    Take 1 tablet (20 mg total) by mouth as needed for spasms.   FUROSEMIDE (LASIX) 40 MG TABLET    Take 1.5 tablets (60 mg total) by mouth daily.   HYDRALAZINE (APRESOLINE) 25 MG TABLET    Take 1.5 tablets (37.5 mg total) by mouth 3 (three) times daily.   LEVOTHYROXINE (SYNTHROID, LEVOTHROID) 50 MCG TABLET    Take 1 tablet (50 mcg total) by mouth daily before breakfast.   METOLAZONE (ZAROXOLYN) 2.5 MG TABLET    Take 1 tablet (2.5 mg total) by mouth daily.   METOPROLOL SUCCINATE (TOPROL XL) 25 MG 24 HR TABLET    Take 1 tablet (25 mg total) by mouth daily.   NORETHINDRONE-ETHINYL ESTRADIOL 1/35 (ALAYCEN 1/35) TABLET     Take 1 tablet by mouth daily.   VITAMIN B-12 (CYANOCOBALAMIN) 1000 MCG TABLET    Take 1 tablet (1,000 mcg total) by mouth daily.  Modified Medications   No medications on file  Discontinued Medications   No medications on file

## 2014-08-18 ENCOUNTER — Ambulatory Visit (HOSPITAL_BASED_OUTPATIENT_CLINIC_OR_DEPARTMENT_OTHER): Payer: Commercial Managed Care - PPO | Attending: Pulmonary Disease

## 2014-08-18 VITALS — Ht 60.0 in | Wt 202.0 lb

## 2014-08-18 DIAGNOSIS — E669 Obesity, unspecified: Secondary | ICD-10-CM

## 2014-08-18 DIAGNOSIS — R0600 Dyspnea, unspecified: Secondary | ICD-10-CM

## 2014-08-18 DIAGNOSIS — R0683 Snoring: Secondary | ICD-10-CM | POA: Diagnosis not present

## 2014-08-18 DIAGNOSIS — I255 Ischemic cardiomyopathy: Secondary | ICD-10-CM

## 2014-08-18 DIAGNOSIS — J4530 Mild persistent asthma, uncomplicated: Secondary | ICD-10-CM

## 2014-08-18 DIAGNOSIS — G4733 Obstructive sleep apnea (adult) (pediatric): Secondary | ICD-10-CM | POA: Insufficient documentation

## 2014-08-18 DIAGNOSIS — I429 Cardiomyopathy, unspecified: Secondary | ICD-10-CM

## 2014-08-18 DIAGNOSIS — G4710 Hypersomnia, unspecified: Secondary | ICD-10-CM | POA: Diagnosis present

## 2014-08-21 ENCOUNTER — Other Ambulatory Visit: Payer: Self-pay | Admitting: Cardiology

## 2014-08-21 DIAGNOSIS — R0600 Dyspnea, unspecified: Secondary | ICD-10-CM | POA: Diagnosis not present

## 2014-08-21 DIAGNOSIS — E669 Obesity, unspecified: Secondary | ICD-10-CM | POA: Diagnosis not present

## 2014-08-21 NOTE — Sleep Study (Signed)
   NAME: Meredith Diaz DATE OF BIRTH:  01/28/1981 MEDICAL RECORD NUMBER [id number]  LOCATION: [facility]  PHYSICIAN: June Vacha D  DATE OF STUDY: 08/18/2014  SLEEP STUDY TYPE: Nocturnal Polysomnogram               REFERRING PHYSICIAN: Michele Mcalpine, MD  INDICATION FOR STUDY: Hypersomnia with sleep apnea  EPWORTH SLEEPINESS SCORE:   9/24 HEIGHT: 5' (152.4 cm)  WEIGHT: 202 lb (91.627 kg)    Body mass index is 39.45 kg/(m^2).  NECK SIZE: 15 in.  MEDICATIONS: Charted for review  SLEEP ARCHITECTURE: Total sleep time 321 minutes with sleep efficiency 88.8%. Stage I was 2.5%, stage II 62.9%, stage III 18.2%, REM 16.4% of total sleep time. Sleep latency 5 minutes, REM latency 127 minutes, awake after sleep onset 35.5 minutes, arousal index or, bedtime medication: None  RESPIRATORY DATA: Apnea hypopnea index (AHI) 19.8 per hour. 106 total events scored including 3 obstructive apneas and 103 hypopneas. Non-positional events. REM AHI 53.7 per hour. This study was ordered as a diagnostic polysomnogram without CPAP titration.  OXYGEN DATA: Moderate snoring with oxygen desaturation to a nadir of 64% and mean saturation 88.7% on room air.  CARDIAC DATA: Sinus rhythm with occasional PVCs  MOVEMENT/PARASOMNIA: No significant movement disturbance, bathroom 1  IMPRESSION/ RECOMMENDATION:   1) Moderate obstructive sleep apnea/hypopnea syndrome, AHI 19.8 per hour with non-positional events. REM AHI 53.7 per hour. Moderate snoring. This study was ordered as a diagnostic polysomnogram. This patient can return for a dedicated CPAP titration study if desired. 2) Room air oxygen saturation on arrival while upright and awake was 90%. She desaturated to a nadir of 64% with mean oxygen saturation through the study 88.7%. A total of 60.1 minutes was recorded with room air oxygen saturation less than 88%.   Waymon Budge Diplomate, American Board of Sleep Medicine  ELECTRONICALLY  SIGNED ON:  08/21/2014, 11:16 AM [facility] PH: [phone]   FX: [phone] ACCREDITED BY THE AMERICAN ACADEMY OF SLEEP MEDICINE

## 2014-08-24 NOTE — Telephone Encounter (Signed)
Patient sent in the wrong rx to be refilled.  Rx was changed to 1.5 tablets 3 times a day.RN spoke to pharmacist. An refill will be ordered.

## 2014-08-25 ENCOUNTER — Ambulatory Visit (HOSPITAL_COMMUNITY)
Admission: RE | Admit: 2014-08-25 | Discharge: 2014-08-25 | Disposition: A | Discharge status: Home / Self Care | Payer: Commercial Managed Care - PPO | Source: Ambulatory Visit | Attending: Pulmonary Disease | Admitting: Pulmonary Disease

## 2014-08-25 DIAGNOSIS — J4530 Mild persistent asthma, uncomplicated: Secondary | ICD-10-CM

## 2014-08-25 DIAGNOSIS — R0600 Dyspnea, unspecified: Secondary | ICD-10-CM

## 2014-08-25 LAB — PULMONARY FUNCTION TEST
DL/VA % pred: 102 %
DL/VA: 4.35 ml/min/mmHg/L
DLCO unc % pred: 25 %
DLCO unc: 4.72 ml/min/mmHg
FEF 25-75 POST: 1.07 L/s
FEF 25-75 Pre: 0.89 L/sec
FEF2575-%Change-Post: 20 %
FEF2575-%PRED-POST: 34 %
FEF2575-%Pred-Pre: 28 %
FEV1-%Change-Post: 6 %
FEV1-%Pred-Post: 29 %
FEV1-%Pred-Pre: 27 %
FEV1-Post: 0.82 L
FEV1-Pre: 0.77 L
FEV1FVC-%CHANGE-POST: -3 %
FEV1FVC-%Pred-Pre: 106 %
FEV6-%CHANGE-POST: 10 %
FEV6-%Pred-Post: 29 %
FEV6-%Pred-Pre: 26 %
FEV6-PRE: 0.87 L
FEV6-Post: 0.96 L
FEV6FVC-%Pred-Post: 101 %
FEV6FVC-%Pred-Pre: 101 %
FVC-%Change-Post: 9 %
FVC-%Pred-Post: 29 %
FVC-%Pred-Pre: 26 %
FVC-POST: 0.96 L
FVC-Pre: 0.87 L
POST FEV6/FVC RATIO: 100 %
PRE FEV1/FVC RATIO: 88 %
Post FEV1/FVC ratio: 85 %
Pre FEV6/FVC Ratio: 100 %
RV % PRED: 66 %
RV: 0.85 L
TLC % pred: 42 %
TLC: 1.88 L

## 2014-08-25 MED ORDER — ALBUTEROL SULFATE (2.5 MG/3ML) 0.083% IN NEBU
2.5000 mg | INHALATION_SOLUTION | Freq: Once | RESPIRATORY_TRACT | Status: AC
  Administered 2014-08-25: 2.5 mg via RESPIRATORY_TRACT

## 2014-08-26 ENCOUNTER — Ambulatory Visit (INDEPENDENT_AMBULATORY_CARE_PROVIDER_SITE_OTHER): Payer: Commercial Managed Care - PPO | Admitting: Pulmonary Disease

## 2014-08-26 ENCOUNTER — Encounter: Payer: Self-pay | Admitting: Pulmonary Disease

## 2014-08-26 VITALS — BP 110/70 | HR 99 | Temp 98.5°F | Wt 203.6 lb

## 2014-08-26 DIAGNOSIS — G4733 Obstructive sleep apnea (adult) (pediatric): Secondary | ICD-10-CM

## 2014-08-26 MED ORDER — CLONAZEPAM 1 MG PO TABS: 1.0000 mg | ORAL_TABLET | Freq: Two times a day (BID) | ORAL | Status: DC

## 2014-08-27 ENCOUNTER — Ambulatory Visit: Payer: Commercial Managed Care - PPO | Admitting: Pulmonary Disease

## 2014-08-27 ENCOUNTER — Encounter: Payer: Self-pay | Admitting: Pulmonary Disease

## 2014-08-27 NOTE — Progress Notes (Signed)
Subjective:     Patient ID: [facility], female   DOB: [birth year], 34 y.o.   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, Programmer, multimedia for [facility], pt of [facility] & referred for pulmonary evaluation w/ hx asthma- she notes increased DOE & no relief from her inhalers> [facility] is a never smoker & was diagnosed w/ asthma first in 23 at age 39 or so when [facility] for pneumonia ([facility] said she was hosp for CHF w/ EF=25-30% and was in hosp for 75mo) & she's had trouble ever since then, disch on NEBS;  Treated over the yrs w/ MDIs, Symbicort/Advair (the latter didn't help), last had Pred before 2013 hosp;  She was allergy tested in middle school & essentially neg x mild grass reaction by her memory;  She gets "sinus infections" a couple of times each yr (she thinks this is her main trigger) & uses Claritin/ Flonase prn;  Symptoms included SOB/ DOE w/ min activ, feels like she can't get a deep breath/ can't get enough air "in"/ not satisfied breathing (states this sensation is present all the time/constant, & she notes "gasping"); states she notes some wheezing when supine at night & some voice issues (?VCD, tight burning in throat) but denies reflux/ dysphagia/ nocturnal cough or choking (note- she has IBS & s/pGB 2013)... She also c/o sleep issues> can't sleep well while lying down, husb c/o her "wheezing"/ some snoring (but does not indicate that she stops breathing)/ restless sleeper/ leg jerks; she does have some daytime sleepiness issues w/ incr fatigue at work and she'll take naps on weekends; ESS=12... Current Meds> Symbicort160- 2puffs daily, AlbutHFA for prn use...      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 Cumulative data from cardiac evals as follows, she was told her cardiac issues are ?congenital,  ?virus damaged her heart>> she has never had cardiac rehab etc...   CXR 2013 at [facility] showed cardiomegaly, biapical pleural thickening, otherw clear & NAD...  Cardiac MRI from [facility] in 2013 showed EF=52% w/ mild global HK, min AI, RV- wnl, Ao- wnl, mild pectus excavqatum w/ deviation of cardiac apex...  Cath was done in 2013 but I cannot find that report (said no signif CAD)...  EKG 11/15 showed STachy, rate 103, ?LAE, suggestive LVH, NSSTTWA...   2DEcho 11/15 by [facility] showed mild LVH, ?poss apical variant hypertrophic cardiomyopathy, EF=40-45% w/ diffuse HK, Gr1DD, MV- mildly thickened leaflets and trivMR, norm LA size...   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 Notes hx anorexia/ depression when in high school w/ lowest wt=80 lbs; psyche rx & counseling resulted in wt gain & she was ~150 lbs in college; gained to 180# after college & lost wt to 165 prior to her marriage at age 34; now she is 34 y/o & has gained to max 203#... She is allergic to ACE inhibitors w/ prev angioedema...   EXAM reveals Afeb, VSS, O2sat=93% on RA at rest;  HEENT- neg, mallampati 2, voice sl hoarse;  Chest- decr BS bilat, clear, decr diaph excursions by percussion, freq sign breaths;  Heart-  RRR w/p m/r/g;  Ext- no c/c/e...  Recent LABS> Chems- wnl;  CBC- ok w/ Hg=12.4, WBC=14K;  TSH=2.62;  B12 level= 180-250 (on oral B12- 1035mcg/d)...  CXR 08/16/14 showed norm heart size, clear lungs, NAD...  Spirometry 08/16/14> poor effort (breathing "high" in her lung volumes)> FVC=0.84 (27%), FEV1=0.66 (24%), %1sec=78, mid-flows reduced at 18% predicted; Test indicated severe restriction w/ small airways dis...   Ambulatory oxygen saturations> O2sat=95% on RA at rest w/  pulse=89/min;  She walked 2 laps and stopped due to SOB- O2sat=83% w/ pulse 121/min...  FENO= 10ppb (results <25ppd implies absence of eosinophilic airway inflammation).  Epworth Sleepiness Score= 12 (indicating that she may be excessively sleepy depending on the situation... IMP/PLAN>>  [facility] has been diagnosed w/ asthma over the last 20+yrs but her current symptoms and spirometry looks more restricted- prob due to "chest wall factors" as we discussed;  She has additional issues from obesity- r/o OSA, and from her Cardiac disease- hx congenital HD w/ surg, HBP, cardiomyopathy w/ combined sys/diast CHF- followed & treated by [facility], [facility]... We discussed the nature of her dyspnea and the need for additional testing and a trial of KLONOPIN 0.5mg  Bid... We will sched FullPFTs and a Sleep Study and have her return after that...  ~  August 26, 2014:  2wk ROV & [facility] returns having completed her Full PFTs and Sleep study> she has in addition been on the Klonopin 0.5mg  Bid but notes no improvement in her breathing on this dose (tol well just no better)...  Full PFTs 08/25/14 showed FVC=0.87 (26%), FEV1=0.77(27%), %1sec=88, mid-flows reduced at 28% predicted; after bronchodil- the FEV1 improved 6%; Lung volumes reduced w/ TLC=1.88 (42%), RV=0.85 (66%), RV/TLC=45 (some air trapping); DLCO=25% predicted, but corrects to normal (102%) when alveolar ventilation is taken into account...  Sleep Polysomnogram 08/18/14> Mod OSA w/ AHI=20/hr and REM AHI=54, mod snoring, desaturation to 64%...  IMP/PLANS>>  PFTs severely restricted (?effort/ accuracy) & seems to be mostly related to obesity/ chest wall and not to underlying lung parenchymal factors (CXR clear, exam clear, we will consider hi-res CTChest later if needed); In this regard we will increase the KLONOPIN to 1mg Bid before giving up on this med; she understands how important wt reduction is to her overall improvement- may want to consider eating disorder  counseling again... Her Sleep study reveals mod OSA & nocturnal hypoxemia- I discussed w/ DrClance & we decided on CPAP set up> RESMED S10 machine- air/auto w/ heated humidity & climate controlled tubing, set on AUTO 5-15, enroll in AirView, mask interface of choice per DME company & pt preference... We will plan ROV recheck in 6 weeks to review compliance & efficacy, then consider doing an ONO...     Past Medical History  Diagnosis Date    DYSPNEA> restrictive lung dis, morbid obesity... OSA> Sleep study 08/2014 w/ AHI=20, worse in REM, desat to 64%... Hx anorexia => morbid obesity...    . Chronic diastolic congestive heart failure, NYHA class 2 07/2011    Echo: EF was 25-30%; No Sig CAD on CATH; No significant finding on Cardiac MRI; follow-up Ech0 2014 --  EF of 50%  . Asthma   . Thyroid disease  >>  On Synthroid50, TSH 12/15 = 2.62   . Depression   . Anxiety   . Essential hypertension   . Stomach problems   . Hypertension   . 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 Cardiomyopathy     Unclear etiology. Last EF by echo 40-45% with global hypokinesis, mild LVH possible apical variant hypertrophic cardiomyopathy        B12 Deficiency >> on oral B12 supplement 1038mcg/d;  Labs showed B12 level 178 => 248   Past Surgical History  Procedure Laterality Date  . Cholecystectomy  2013  . Cardiac catheterization  08/2011    Normal Coronaries - LVEDP 32 mmHg  . Transthoracic echocardiogram  07/2011    Moderate concentric LVH, mildly dilated. EF 25-30% no diastolic dysfunction parameters to suggest elevated LAP  . Cardiac mri  April 2013    No suggestion of a congenital abnormality; EF ~ 52% - no regional wall motion abnormalities., no increased myocardial signal intensity. No perfusion abnormality.  . Transthoracic echocardiogram   December 2015    Last EF by echo 40-45% with global hypokinesis, mild LVH possible apical variant hypertrophic cardiomyopathy     Outpatient Encounter Prescriptions as of 08/26/2014  Medication Sig  . albuterol (VENTOLIN HFA) 108 (90 BASE) MCG/ACT inhaler Inhale 2 puffs into the lungs every 6 (six) hours as needed.  . budesonide-formoterol (SYMBICORT) 160-4.5 MCG/ACT inhaler Inhale 2 puffs into the lungs daily.  . clobetasol cream (TEMOVATE) 0.05 % as needed.  . clonazePAM (KLONOPIN) 1 MG tablet Take 1 tablet (1 mg total) by mouth 2 (two) times daily.  [name] dicyclomine (BENTYL) 20 MG tablet Take 1 tablet (20 mg total) by mouth as needed for spasms.  . furosemide (LASIX) 40 MG tablet Take 1.5 tablets (60 mg total) by mouth daily.  . hydrALAZINE (APRESOLINE) 25 MG tablet Take 1.5 tablets (37.5 mg total) by mouth 3 (three) times daily. (Patient taking differently: Take 37.5 mg by mouth 3 (three) times daily. Take 1 tablet (25mg ) PO three times daily.)  . levothyroxine (SYNTHROID, LEVOTHROID) 50 MCG tablet Take 1 tablet (50 mcg total) by mouth daily before breakfast.  . metolazone (ZAROXOLYN) 2.5 MG tablet Take 1 tablet (2.5 mg total) by mouth daily.  . metoprolol succinate (TOPROL XL) 25 MG 24 hr tablet Take 1 tablet (25 mg total) by mouth daily.  . norethindrone-ethinyl estradiol 1/35 (ALAYCEN 1/35) tablet Take 1 tablet by mouth daily.  . vitamin B-12 (CYANOCOBALAMIN) 1000 MCG tablet Take 1 tablet (1,000 mcg total) by mouth daily.  . [DISCONTINUED] clonazePAM (KLONOPIN) 0.5 MG tablet Take 1/2-1 tablet PO BID   Allergies  Allergen Reactions  . Lisinopril Swelling    Angioedema facial    Family History  Problem Relation Age of Onset  . Depression Mother   . Hypertension Father   . Heart disease Maternal Grandfather   . Heart disease Paternal Grandmother   . Breast cancer Paternal Grandmother   . Colon cancer Maternal Grandmother   . Asthma Paternal Grandmother    History   Social  History  . Marital Status: Married    Spouse Name: N/A  . Number of Children: N/A  . Years of Education: N/A   Occupational History  . editor    Social History Main Topics  . Smoking status: Never Smoker   . Smokeless tobacco: Not on file  . Alcohol Use: No  . Drug Use: No  . Sexual Activity: Not on file   Other Topics Concern  . Not on file   Social History Narrative    Current Medications, Allergies, Past Medical History, Past Surgical History, Family History, and Social  History were reviewed in [facility] electronic medical record.   Review of Systems             All symptoms NEG except where BOLDED >>  Constitutional:  Denies F/C/S, anorexia, unexpected weight change. HEENT:  No HA, visual changes, earache, nasal symptoms, sore throat, hoarseness. Resp:  cough, sputum, hemoptysis; SOB, tightness, wheezing. Cardio:  CP, palpit, DOE, orthopnea, edema. GI:  Denies N/V/D/C or blood in stool; no reflux, abd pain, distention, or gas. GU:  No dysuria, freq, urgency, hematuria, or flank pain. MS:  Denies joint pain, swelling, tenderness, or decr ROM; no neck pain, back pain, etc. Neuro:  No tremors, seizures, dizziness, syncope, weakness, numbness, gait abn. Skin:  No suspicious lesions or skin rash. Heme:  No adenopathy, bruising, bleeding. Psyche: Denies confusion, sleep disturbance, hallucinations, anxiety, depression.   Objective:   Physical Exam    Vital Signs:  Reviewed...  General:  WD, overweight, 34 y/o WF in NAD; alert & oriented; pleasant & cooperative... HEENT:  [place]/AT; Conjunctiva- pink, Sclera- nonicteric, EOM-wnl, PERRLA, EACs-clear, TMs-wnl; NOSE-clear; THROAT-clear & wnl. Neck:  Supple w/ full ROM; no JVD; normal carotid impulses w/o bruits; no thyromegaly or nodules palpated; no lymphadenopathy. Chest:  Clear to P & A; without wheezes, rales, or rhonchi heard (decr BS at bases) Heart:  Regular Rhythm; norm S1 & S2, gr1/6 SEM, without rubs or gallops  detected. Abdomen:  Obese, soft, nontender- no guarding or rebound; normal bowel sounds; no organomegaly or masses palpated. Ext:  Normal ROM; without deformities or arthritic changes; no varicose veins, +venous insuffic, tr edema;  Pulses intact w/o bruits. Neuro:  CNs II-XII intact; motor testing normal; sensory testing normal; gait normal & balance OK. Derm:  No lesions noted; no rash etc. Lymph:  No cervical, supraclavicular, axillary, or inguinal adenopathy palpated.   Assessment:      DYSPNEA>>  Restrictive lung dis- felt to be due to obesity & chest wall factors and not parenchymal pulm dis => increase Klonopin 1mg  Bid... OSA w/ mod AHI=20/hr (worse w/ REM) on sleep study 08/2014 => arrange for CPAP... Hx anorexia => morbid obesity and she may benefit from psyche counseling again... Hx congenital HD w/ surg, HBP, cardiomyopathy w/ combined sys/diast CHF- followed & treated by [facility], [facility]...      Plan:     Patient's Medications  New Prescriptions   CLONAZEPAM (KLONOPIN) 1 MG TABLET    Take 1 tablet (1 mg total) by mouth 2 (two) times daily.  Previous Medications   ALBUTEROL (VENTOLIN HFA) 108 (90 BASE) MCG/ACT INHALER    Inhale 2 puffs into the lungs every 6 (six) hours as needed.   BUDESONIDE-FORMOTEROL (SYMBICORT) 160-4.5 MCG/ACT INHALER    Inhale 2 puffs into the lungs daily.   CLOBETASOL CREAM (TEMOVATE) 0.05 %    as needed.   DICYCLOMINE (BENTYL) 20 MG TABLET    Take 1 tablet (20 mg total) by mouth as needed for spasms.   FUROSEMIDE (LASIX) 40 MG TABLET    Take 1.5 tablets (60 mg total) by mouth daily.   HYDRALAZINE (APRESOLINE) 25 MG TABLET    Take 1.5 tablets (37.5 mg total) by mouth 3 (three) times daily.   LEVOTHYROXINE (SYNTHROID, LEVOTHROID) 50 MCG TABLET    Take 1 tablet (50 mcg total) by mouth daily before breakfast.   METOLAZONE (ZAROXOLYN) 2.5 MG TABLET    Take 1 tablet (2.5 mg total) by mouth daily.   METOPROLOL SUCCINATE (TOPROL XL) 25 MG 24 HR TABLET  Take 1 tablet (25 mg total) by mouth daily.   NORETHINDRONE-ETHINYL ESTRADIOL 1/35 (ALAYCEN 1/35) TABLET    Take 1 tablet by mouth daily.   VITAMIN B-12 (CYANOCOBALAMIN) 1000 MCG TABLET    Take 1 tablet (1,000 mcg total) by mouth daily.  Modified Medications   No medications on file  Discontinued Medications   CLONAZEPAM (KLONOPIN) 0.5 MG TABLET    Take 1/2-1 tablet PO BID

## 2014-09-01 ENCOUNTER — Telehealth: Payer: Self-pay | Admitting: Pulmonary Disease

## 2014-09-01 NOTE — Telephone Encounter (Signed)
Pt has been called multiple times by writer to find out per SN previous pulm dr that she saw at a younger age. Left message on cell and work # for pt to call back. Waiting return call

## 2014-09-02 NOTE — Telephone Encounter (Signed)
Pt returned Rachel's call Pt relayed the following information: 1. Pulmonologist for the past 7 yrs prior to moving to [place] >> Dr. Orlan Leavens at [facility] @ [phone] 2. Primary Care Provider for the past 7 years prior to moving to [place] >> Dr. Tye [place] (whom left about 1 yr before she did, then pt saw the NP whom has also left the practice) @ [phone] 3. Teenage and college years, she saw her family's PCP and is unable to recall that provider's name >> [facility] [place] @ [phone].  Will print message for SN and route to [place].

## 2014-09-09 NOTE — Telephone Encounter (Signed)
Have spoken with pt's previous pulmonary office and received info. Nothing further is needed at this time.

## 2014-09-17 ENCOUNTER — Telehealth: Payer: Self-pay | Admitting: Cardiology

## 2014-09-23 ENCOUNTER — Ambulatory Visit: Payer: Commercial Managed Care - PPO | Admitting: Pulmonary Disease

## 2014-09-23 NOTE — Telephone Encounter (Signed)
Close encounter 

## 2014-10-14 ENCOUNTER — Encounter: Payer: Self-pay | Admitting: Pulmonary Disease

## 2014-10-14 ENCOUNTER — Ambulatory Visit (INDEPENDENT_AMBULATORY_CARE_PROVIDER_SITE_OTHER): Payer: Commercial Managed Care - PPO | Admitting: Pulmonary Disease

## 2014-10-14 VITALS — BP 130/88 | HR 98 | Temp 98.7°F | Wt 201.0 lb

## 2014-10-14 DIAGNOSIS — G4733 Obstructive sleep apnea (adult) (pediatric): Secondary | ICD-10-CM

## 2014-10-14 DIAGNOSIS — I5032 Chronic diastolic (congestive) heart failure: Secondary | ICD-10-CM

## 2014-10-14 DIAGNOSIS — J4530 Mild persistent asthma, uncomplicated: Secondary | ICD-10-CM

## 2014-10-14 DIAGNOSIS — E039 Hypothyroidism, unspecified: Secondary | ICD-10-CM

## 2014-10-14 DIAGNOSIS — I429 Cardiomyopathy, unspecified: Secondary | ICD-10-CM

## 2014-10-14 DIAGNOSIS — J984 Other disorders of lung: Secondary | ICD-10-CM

## 2014-10-14 DIAGNOSIS — I10 Essential (primary) hypertension: Secondary | ICD-10-CM

## 2014-10-14 DIAGNOSIS — E669 Obesity, unspecified: Secondary | ICD-10-CM

## 2014-10-14 DIAGNOSIS — M4120 Other idiopathic scoliosis, site unspecified: Secondary | ICD-10-CM | POA: Insufficient documentation

## 2014-10-14 DIAGNOSIS — Z9989 Dependence on other enabling machines and devices: Secondary | ICD-10-CM

## 2014-10-14 NOTE — Patient Instructions (Signed)
Today we updated your med list in our EPIC system...    Continue your current medications the same...  We decided to check w/ an ENT specialist to check your vocal cords and evaluate your scratchy voice.Marland KitchenMarland Kitchen  I will check to see who we can send you to for a re-evaluation of your scoliosis as well...  In the meanwhile >>    Continue your diet & exercise program (GREAT JOB! Keep it up)...    Continue the KLONOPIN twice daily & you can adjust 1/2 to 1 tab twice daily as we discussed...    Continue your inhalers as you are doing...  We are checking your CPAP download and still trying to get your pulmonary records from [facility]...  Let's plan a follow up visit in 76mo, sooner if needed for problems.Marland KitchenMarland Kitchen

## 2014-10-14 NOTE — Progress Notes (Signed)
Subjective:     Patient ID: Meredith Diaz, female   DOB: [birth year], 34 y.o.   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, Programmer, multimedia for [employer], pt of [employer] & referred for pulmonary evaluation w/ hx asthma- she notes increased DOE & no relief from her inhalers> Meredith Diaz is a never smoker & was diagnosed w/ asthma first in 23 at age 39 or so when [facility] for pneumonia (Cariology said she was hosp for CHF w/ EF=25-30% and was in hosp for 75mo) & she's had trouble ever since then, disch on NEBS;  Treated over the yrs w/ MDIs, Symbicort/Advair (the latter didn't help), last had Pred before 2013 hosp;  She was allergy tested in middle school & essentially neg x mild grass reaction by her memory;  She gets "sinus infections" a couple of times each yr (she thinks this is her main trigger) & uses Claritin/ Flonase prn;  Symptoms included SOB/ DOE w/ min activ, feels like she can't get a deep breath/ can't get enough air "in"/ not satisfied breathing (states this sensation is present all the time/constant, & she notes "gasping"); states she notes some wheezing when supine at night & some voice issues (?VCD, tight burning in throat) but denies reflux/ dysphagia/ nocturnal cough or choking (note- she has IBS & s/pGB 2013)... She also c/o sleep issues> can't sleep well while lying down, husb c/o her "wheezing"/ some snoring (but does not indicate that she stops breathing)/ restless sleeper/ leg jerks; she does have some daytime sleepiness issues w/ incr fatigue at work and she'll take naps on weekends; ESS=12... Current Meds> Symbicort160- 2puffs daily, AlbutHFA for prn use...      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 Cumulative data from cardiac evals as follows, she was told her cardiac issues are ?congenital,  ?virus damaged her heart>> she has never had cardiac rehab etc...   CXR 2013 at [facility] showed cardiomegaly, biapical pleural thickening, otherw clear & NAD...  Cardiac MRI from [facility] in 2013 showed EF=52% w/ mild global HK, min AI, RV- wnl, Ao- wnl, mild pectus excavqatum w/ deviation of cardiac apex...  Cath was done in 2013 but I cannot find that report (said no signif CAD)...  EKG 11/15 showed STachy, rate 103, ?LAE, suggestive LVH, NSSTTWA...   2DEcho 11/15 by DrHarding showed mild LVH, ?poss apical variant hypertrophic cardiomyopathy, EF=40-45% w/ diffuse HK, Gr1DD, MV- mildly thickened leaflets and trivMR, norm LA size...   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 Notes hx anorexia/ depression when in high school w/ lowest wt=80 lbs; psyche rx & counseling resulted in wt gain & she was ~150 lbs in college; gained to 180# after college & lost wt to 165 prior to her marriage at age 34; now she is 34 y/o & has gained to max 203#... She is allergic to ACE inhibitors w/ prev angioedema...   EXAM reveals Afeb, VSS, O2sat=93% on RA at rest;  HEENT- neg, mallampati 2, voice sl hoarse;  Chest- decr BS bilat, clear, decr diaph excursions by percussion, freq sign breaths;  Heart-  RRR w/p m/r/g;  Ext- no c/c/e...  Recent LABS> Chems- wnl;  CBC- ok w/ Hg=12.4, WBC=14K;  TSH=2.62;  B12 level= 180-250 (on oral B12- 1071mcg/d)...  CXR 08/16/14 showed norm heart size, clear lungs, NAD...  Spirometry 08/16/14> poor effort (breathing "high" in her lung volumes)> FVC=0.84 (27%), FEV1=0.66 (24%), %1sec=78, mid-flows reduced at 18% predicted; Test indicated severe restriction w/ small airways dis...   Ambulatory oxygen saturations> O2sat=95% on RA at rest w/  pulse=89/min;  She walked 2 laps and stopped due to SOB- O2sat=83% w/ pulse 121/min...  FENO= 10ppb (results <25ppd implies absence of eosinophilic airway inflammation).  Epworth Sleepiness Score= 12 (indicating that she may be excessively sleepy depending on the situation... IMP/PLAN>>  Meredith Diaz has been diagnosed w/ asthma over the last 20+yrs but her current symptoms and spirometry looks more restricted- prob due to "chest wall factors" as we discussed;  She has additional issues from obesity- r/o OSA, and from her Cardiac disease- hx congenital HD w/ surg, HBP, cardiomyopathy w/ combined sys/diast CHF- followed & treated by Cards, DrHarding... We discussed the nature of her dyspnea and the need for additional testing and a trial of KLONOPIN 0.5mg  Bid... We will sched FullPFTs and a Sleep Study and have her return after that...   ~  August 26, 2014:  2wk ROV & Meredith Diaz returns having completed her Full PFTs and Sleep study> she has in addition been on the Klonopin 0.5mg  Bid but notes no improvement in her breathing on this dose (tol well just no better)...  Full PFTs 08/25/14 showed FVC=0.87 (26%), FEV1=0.77(27%), %1sec=88, mid-flows reduced at 28% predicted; after bronchodil- the FEV1 improved 6%; Lung volumes reduced w/ TLC=1.88 (42%), RV=0.85 (66%), RV/TLC=45 (some air trapping); DLCO=25% predicted, but corrects to normal (102%) when alveolar ventilation is taken into account...  Sleep Polysomnogram 08/18/14> Mod OSA w/ AHI=20/hr and REM AHI=54, mod snoring, desaturation to 64%...  IMP/PLANS>>  PFTs severely restricted (?effort/ accuracy) & seems to be mostly related to obesity/ chest wall and not to underlying lung parenchymal factors (CXR clear, exam clear, we will consider hi-res CTChest later if needed); In this regard we will increase the KLONOPIN to 1mg Bid before giving up on this med; she understands how important wt reduction is to her overall improvement- may want to consider eating disorder  counseling again... Her Sleep study reveals mod OSA & nocturnal hypoxemia- I discussed w/ DrClance & we decided on CPAP set up> RESMED S10 machine- air/auto w/ heated humidity & climate controlled tubing, set on AUTO 5-15, enroll in AirView, mask interface of choice per DME company & pt preference... We will plan ROV recheck in 6 weeks to review compliance & efficacy, then consider doing an ONO...    ~  October 14, 2014:  6wk ROV & Meredith Diaz has lost 2# in the interval but not able to exercise- walks some, gets winded, stairs are difficult but able to negotiate one flight; we reviewed how critically important wt reduction is for her;  She still appears sl hoarse to me, scratchy voice which she believes is due to "allergy issues" => rec ENT evaluation for completeness;  She increased the Klonopin to 1mg Bid & this is helping some she says- not as SOB at rest, able to get a deeper breath she thinks, etc;  Using CPAP satis w/ good compliance & download data showing AHI<1/hr on CPAP; she has nasal pillows but wants full face mask & we will let [facility] know... We reviewed the following medical problems>>  DYSPNEA- multifactorial> ?asthma is a small component w/ restrictive dis due to obesity & scoliosis as a major factor, and CHF, deconditioning, anxiety as additional factors...    OSA>  Mod OSA on sleep study 4/16 w/ AHI=20/hr and REM AHI=54, mod snoring, desaturation to 64%; started on CPAP via [facility], auto 5-15 w/ good compliance => AHI <1/hr on the CPAP autoset 5-15, she wants to change nasal pillows to full face mask, we will check w/ [employer], then do ONO...    Hx asthma> on Symbicort160- 2sp daily, VentolinHFA rescue prn- using 2-3x/wk; PFT 4/16 w/ severe restriction, + small airways dis & min response to bronchodil; asked to continue inhalers for now...    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    Hx HBP> on MetopER25, Apres25- 1.5 tabsTid, Lasix40- 1.5tabsQam, Zaroxyln2.5 prn swelling- taking it almost every day; K=4.2 5/16 at [facility]; Note> allergic to ACE w/ angioedema of lips in past; BP= 130/88 today w/o CP etc...    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    Eating disorder/ Morbid Obesity> unusual hx of eating disorder w/ ANOREXIA as teen=> OBESITY w/ wt=201#, 5' Tall, BMI=39-40; she notes hx anorexia/ depression when in high school w/ lowest wt=80 lbs; psyche rx & counseling resulted in wt gain & she was ~150 lbs in college; gained to 180# after college & lost wt to 165 prior to her marriage at age 83 (she felt better when wt was down); now she is 34 y/o & has gained to max 203#...    Hypothyroidism> on Synthroid50; Labs 12/15 showed TSH=2.62, FreeT4=0.87    Hx IBS & s/p GB in 2013> aware...    Scoliosis & back pain> she was prev evaluated at [facility] by a back specialist, she does not recall the details, no records available, she notes legs get tired, she is not falling, notes that her hands shake on occas... 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    VitB12 defic> Labs showed B12 blood level 179-248 and she is rec to take B12 orally Qd w/ recheck levels...     Anxiety/ depression> on Klonopin 1mg  Bid at present... EXAM reveals Afeb, VSS, O2sat=94% on RA at rest;  HEENT- neg, mallampati 2, voice sl hoarse;  Chest- decr BS bilat, clear, decr diaph excursions by percussion, occas sign breaths;  Heart- RRR w/p m/r/g;  Ext- no c/c/e.  LABS 5/16 at [facility] showed Chems- wnl w/ Cr=1.04, BS=83, A1c=5.8;  FLP- at goals on diet alone...  [employer] AirView download> 5/11 - 10/13/14 showed good compliance w/ AHI <1/hr on the CPAP autoset 5-15... PLAN>>  She has a very complex medical situation as outlined above, and we are hampered by lack of old records (I have asked her to help in getting this data);  For now she will continue  the Symbicort, Ventolin, Klonopin, & CPAP; we will have [facility] look into full face mask per her preference; then proceed w/ ONO;  She knows the importance of wt reduction & may want to consider counseling from Dr. Stephania Fragmin for eating disorder;  Continue cardiac meds and f/u w/ DrHarding;  She is only 34 y/o w/ signif pulm restriction & heart disease;  We will XRay her spine in anticipation of getting old records from [place] & poss referral if this is playing a signif roll as well...    Past Medical History  Diagnosis Date    DYSPNEA> restrictive lung dis, morbid obesity... OSA> Sleep study 08/2014 w/ AHI=20, worse in REM, desat to 64%... Hx anorexia => morbid obesity...    . Chronic diastolic congestive heart failure, NYHA class 2 07/2011    Echo: EF was 25-30%; No Sig CAD on CATH; No significant finding on Cardiac MRI; follow-up Ech0 2014 --  EF of 50%  . Asthma   . Thyroid disease  >>  On Synthroid50, TSH 12/15 = 2.62   . Depression   . Anxiety   . Essential hypertension   . Stomach problems   . Hypertension   . 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 Cardiomyopathy     Unclear etiology. Last EF by echo 40-45% with global hypokinesis, mild LVH possible apical variant hypertrophic  cardiomyopathy        B12 Deficiency >> on oral B12 supplement 1072mcg/d;  Labs showed B12 level 178 => 248    Past Surgical History  Procedure Laterality Date  . Cholecystectomy  2013  . Cardiac catheterization  08/2011    Normal Coronaries - LVEDP 32 mmHg  . Transthoracic echocardiogram  07/2011    Moderate concentric LVH, mildly dilated. EF 25-30% no diastolic dysfunction parameters to suggest elevated LAP  . Cardiac mri  April 2013    No suggestion of a congenital abnormality; EF ~ 52% - no regional wall motion abnormalities., no increased myocardial signal intensity. No perfusion abnormality.  . Transthoracic echocardiogram  December 2015    Last EF by echo 40-45% with global hypokinesis, mild LVH possible apical variant hypertrophic cardiomyopathy     Outpatient Encounter Prescriptions as of 10/14/2014  Medication Sig  . albuterol (VENTOLIN HFA) 108 (90 BASE) MCG/ACT inhaler Inhale 2 puffs into the lungs every 6 (six) hours as needed.  . budesonide-formoterol (SYMBICORT) 160-4.5 MCG/ACT inhaler Inhale 2 puffs into the lungs daily.  . clobetasol cream (TEMOVATE) 0.05 % as needed.  . clonazePAM (KLONOPIN) 1 MG tablet Take 1 tablet (1 mg total) by mouth 2 (two) times daily.  [name] dicyclomine (BENTYL) 20 MG tablet Take 1 tablet (20 mg total) by mouth as needed for spasms.  . furosemide (LASIX) 40 MG tablet Take 1.5 tablets (60 mg total) by mouth daily.  . hydrALAZINE (APRESOLINE) 25 MG tablet Take 1.5 tablets (37.5 mg total) by mouth 3 (three) times daily. (Patient taking differently: Take 37.5 mg by mouth 3 (three) times daily. Take 1 tablet ( ) PO three times daily.)  . levothyroxine (SYNTHROID, LEVOTHROID) 50 MCG tablet Take 1 tablet (50 mcg total) by mouth daily before breakfast.  . metolazone (ZAROXOLYN) 2.5 MG tablet Take 1 tablet (2.5 mg total) by mouth daily.  . metoprolol succinate (TOPROL XL) 25 MG 24 hr tablet Take 1  tablet (25 mg total) by mouth daily.  .  norethindrone-ethinyl estradiol 1/35 (ALAYCEN 1/35) tablet Take 1 tablet by mouth daily.  . vitamin B-12 (CYANOCOBALAMIN) 1000 MCG tablet Take 1 tablet (1,000 mcg total) by mouth daily.   No facility-administered encounter medications on file as of 10/14/2014.    Allergies  Allergen Reactions  . Lisinopril Swelling    Angioedema facial    Immunization History  Administered Date(s) Administered  . Influenza-Unspecified 01/15/2014    Current Medications, Allergies, Past Medical History, Past Surgical History, Family History, and Social History were reviewed in [employer] record.   Review of Systems             All symptoms NEG except where BOLDED >>  Constitutional:  Denies F/C/S, anorexia, unexpected weight change. HEENT:  No HA, visual changes, earache, nasal symptoms, sore throat, hoarseness. Resp:  cough, sputum, hemoptysis; SOB, tightness, wheezing. Cardio:  CP, palpit, DOE, orthopnea, edema. GI:  Denies N/V/D/C or blood in stool; no reflux, abd pain, distention, or gas. GU:  No dysuria, freq, urgency, hematuria, or flank pain. MS:  Denies joint pain, swelling, tenderness, or decr ROM; no neck pain, back pain, etc. Neuro:  No tremors, seizures, dizziness, syncope, weakness, numbness, gait abn. Skin:  No suspicious lesions or skin rash. Heme:  No adenopathy, bruising, bleeding. Psyche: Denies confusion, sleep disturbance, hallucinations, anxiety, depression.   Objective:   Physical Exam    Vital Signs:  Reviewed...  General:  WD, overweight, 34 y/o WF in NAD; alert & oriented; pleasant & cooperative... HEENT:  [place]/AT; Conjunctiva- pink, Sclera- nonicteric, EOM-wnl, PERRLA, EACs-clear, TMs-wnl; NOSE-clear; THROAT-clear & wnl. Neck:  Supple w/ full ROM; no JVD; normal carotid impulses w/o bruits; no thyromegaly or nodules palpated; no lymphadenopathy. Chest:  Clear to P & A; without wheezes, rales, or rhonchi heard (decr BS at bases) Heart:  Regular  Rhythm; gr1/6 SEM, without rubs or gallops detected. Abdomen:  Obese, soft, nontender- no guarding or rebound; normal bowel sounds; no organomegaly or masses palpated. Ext:  Normal ROM; without deformities or arthritic changes; no varicose veins, +venous insuffic, tr edema;  Pulses intact w/o bruits. Neuro:  No focal neuro deficits, gait normal & balance OK. Derm:  No lesions noted; no rash etc. Lymph:  No cervical, supraclavicular, axillary, or inguinal adenopathy palpated.   Assessment:      DYSPNEA>> multifactorial Restrictive lung dis- felt to be due to obesity & chest wall factors and not parenchymal pulm dis => continue Klonopin 1mg  Bid... OSA w/ mod AHI=20/hr (worse w/ REM & desat to 64%) on sleep study 08/2014 => now on CPAP Auto 5-15 w/ good compliance and AHI<1, we will proceed w/ ONO... Hx eating disorder w/ anorexia => morbid obesity and she may benefit from psyche counseling again... Hx congenital HD w/ surg 1981, HBP, cardiomyopathy w/ combined sys/diast CHF- followed & treated by Cards, DrHarding...   PLAN>>  She has a very complex medical situation as outlined above, and we are hampered by lack of old records (I have asked her to help in getting this data);  For now she will continue the Symbicort, Ventolin, Klonopin, & CPAP; we will have [facility] look into full face mask per her preference; then proceed w/ ONO;  She knows the importance of wt reduction & may want to consider counseling from Dr. Stephania Fragmin for eating disorder;  Continue cardiac meds and f/u w/ DrHarding;  She is only 34 y/o w/ signif pulm restriction & heart disease;  We will XRay her spine in anticipation of getting old records from [place] & poss referral if this is playing a signif roll as well...     Plan:     Patient's Medications  New Prescriptions   No medications on file  Previous Medications   ALBUTEROL (VENTOLIN HFA) 108 (90 BASE) MCG/ACT INHALER    Inhale 2 puffs into the lungs every 6 (six) hours as  needed.   BUDESONIDE-FORMOTEROL (SYMBICORT) 160-4.5 MCG/ACT INHALER    Inhale 2 puffs into the lungs daily.   CLOBETASOL CREAM (TEMOVATE) 0.05 %    as needed.   CLONAZEPAM (KLONOPIN) 1 MG TABLET    Take 1 tablet (1 mg total) by mouth 2 (two) times daily.   DICYCLOMINE (BENTYL) 20 MG TABLET    Take 1 tablet (20 mg total) by mouth as needed for spasms.   FUROSEMIDE (LASIX) 40 MG TABLET    Take 1.5 tablets (60 mg total) by mouth daily.   HYDRALAZINE (APRESOLINE) 25 MG TABLET    Take 1.5 tablets (37.5 mg total) by mouth 3 (three) times daily.   LEVOTHYROXINE (SYNTHROID, LEVOTHROID) 50 MCG TABLET    Take 1 tablet (50 mcg total) by mouth daily before breakfast.   METOLAZONE (ZAROXOLYN) 2.5 MG TABLET    Take 1 tablet (2.5 mg total) by mouth daily.   METOPROLOL SUCCINATE (TOPROL XL) 25 MG 24 HR TABLET    Take 1 tablet (25 mg total) by mouth daily.   NORETHINDRONE-ETHINYL ESTRADIOL 1/35 (ALAYCEN 1/35) TABLET    Take 1 tablet by mouth daily.   VITAMIN B-12 (CYANOCOBALAMIN) 1000 MCG TABLET    Take 1 tablet (1,000 mcg total) by mouth daily.  Modified Medications   No medications on file  Discontinued Medications   No medications on file

## 2014-11-01 ENCOUNTER — Other Ambulatory Visit (INDEPENDENT_AMBULATORY_CARE_PROVIDER_SITE_OTHER): Payer: Commercial Managed Care - PPO

## 2014-11-01 ENCOUNTER — Encounter: Payer: Self-pay | Admitting: Internal Medicine

## 2014-11-01 ENCOUNTER — Other Ambulatory Visit: Payer: Self-pay | Admitting: Internal Medicine

## 2014-11-01 ENCOUNTER — Ambulatory Visit (INDEPENDENT_AMBULATORY_CARE_PROVIDER_SITE_OTHER): Payer: Commercial Managed Care - PPO | Admitting: Internal Medicine

## 2014-11-01 VITALS — BP 108/78 | HR 98 | Temp 98.5°F | Resp 16 | Ht 60.0 in | Wt 205.0 lb

## 2014-11-01 DIAGNOSIS — I5042 Chronic combined systolic (congestive) and diastolic (congestive) heart failure: Secondary | ICD-10-CM

## 2014-11-01 DIAGNOSIS — E039 Hypothyroidism, unspecified: Secondary | ICD-10-CM

## 2014-11-01 DIAGNOSIS — R0600 Dyspnea, unspecified: Secondary | ICD-10-CM

## 2014-11-01 LAB — TSH: TSH: 12.32 u[IU]/mL — AB (ref 0.35–4.50)

## 2014-11-01 LAB — BRAIN NATRIURETIC PEPTIDE: Pro B Natriuretic peptide (BNP): 37 pg/mL (ref 0.0–100.0)

## 2014-11-01 LAB — BASIC METABOLIC PANEL
BUN: 15 mg/dL (ref 6–23)
CHLORIDE: 96 meq/L (ref 96–112)
CO2: 30 meq/L (ref 19–32)
CREATININE: 0.99 mg/dL (ref 0.40–1.20)
Calcium: 9.4 mg/dL (ref 8.4–10.5)
GFR: 67.95 mL/min (ref >60.00)
Glucose, Bld: 93 mg/dL (ref 70–99)
Potassium: 3.8 mEq/L (ref 3.5–5.1)
Sodium: 136 mEq/L (ref 135–145)

## 2014-11-01 LAB — T4, FREE: FREE T4: 0.78 ng/dL (ref 0.60–1.60)

## 2014-11-01 LAB — HEMOGLOBIN A1C: HEMOGLOBIN A1C: 5.7 % (ref 4.6–6.5)

## 2014-11-01 MED ORDER — LEVOTHYROXINE SODIUM 75 MCG PO TABS: 75.0000 ug | ORAL_TABLET | Freq: Every day | ORAL | Status: DC

## 2014-11-01 MED ORDER — LEVOTHYROXINE SODIUM 50 MCG PO TABS: 50.0000 ug | ORAL_TABLET | Freq: Every day | ORAL | Status: DC

## 2014-11-01 MED ORDER — CHOLESTYRAMINE LIGHT 4 GM/DOSE PO POWD: 4.0000 g | Freq: Two times a day (BID) | ORAL | Status: DC

## 2014-11-01 NOTE — Patient Instructions (Signed)
We are going to get you into the heart/lung rehab to work on endurance safely to give you security to push yourself.   We will check your labs today and call you back with the results.   We have sent in the cholestyramine to help with the bile to see if this helps to bowels.   Diet and Irritable Bowel Syndrome  No cure has been found for irritable bowel syndrome (IBS). Many options are available to treat the symptoms. Your caregiver will give you the best treatments available for your symptoms. He or she will also encourage you to manage stress and to make changes to your diet. You need to work with your caregiver and Registered Dietician to find the best combination of medicine, diet, counseling, and support to control your symptoms. The following are some diet suggestions. FOODS THAT MAKE IBS WORSE  Fatty foods, such as [place] fries.  Milk products, such as cheese or ice cream.  Chocolate.  Alcohol.  Caffeine (found in coffee and some sodas).  Carbonated drinks, such as soda. If certain foods cause symptoms, you should eat less of them or stop eating them. FOOD JOURNAL   Keep a journal of the foods that seem to cause distress. Write down:  What you are eating during the day and when.  What problems you are having after eating.  When the symptoms occur in relation to your meals.  What foods always make you feel badly.  Take your notes with you to your caregiver to see if you should stop eating certain foods. FOODS THAT MAKE IBS BETTER Fiber reduces IBS symptoms, especially constipation, because it makes stools soft, bulky, and easier to pass. Fiber is found in bran, bread, cereal, beans, fruit, and vegetables. Examples of foods with fiber include:  Apples.  Peaches.  Pears.  Berries.  Figs.  Broccoli, raw.  Cabbage.  Carrots.  Raw peas.  Kidney beans.  Lima beans.  Whole-grain bread.  Whole-grain cereal. Add foods with fiber to your diet a little at a  time. This will let your body get used to them. Too much fiber at once might cause gas and swelling of your abdomen. This can trigger symptoms in a person with IBS. Caregivers usually recommend a diet with enough fiber to produce soft, painless bowel movements. High fiber diets may cause gas and bloating. However, these symptoms often go away within a few weeks, as your body adjusts. In many cases, dietary fiber may lessen IBS symptoms, particularly constipation. However, it may not help pain or diarrhea. High fiber diets keep the colon mildly enlarged (distended) with the added fiber. This may help prevent spasms in the colon. Some forms of fiber also keep water in the stool, thereby preventing hard stools that are difficult to pass.  Besides telling you to eat more foods with fiber, your caregiver may also tell you to get more fiber by taking a fiber pill or drinking water mixed with a special high fiber powder. An example of this is a natural fiber laxative containing psyllium seed.  TIPS  Large meals can cause cramping and diarrhea in people with IBS. If this happens to you, try eating 4 or 5 small meals a day, or try eating less at each of your usual 3 meals. It may also help if your meals are low in fat and high in carbohydrates. Examples of carbohydrates are pasta, rice, whole-grain breads and cereals, fruits, and vegetables.  If dairy products cause your symptoms to flare  up, you can try eating less of those foods. You might be able to handle yogurt better than other dairy products, because it contains bacteria that helps with digestion. Dairy products are an important source of calcium and other nutrients. If you need to avoid dairy products, be sure to talk with a Registered Dietitian about getting these nutrients through other food sources.  Drink enough water and fluids to keep your urine clear or pale yellow. This is important, especially if you have diarrhea. FOR MORE INFORMATION    International Foundation for Functional Gastrointestinal Disorders: www.iffgd.org  National Digestive Diseases Information Clearinghouse: digestive.StageSync.si Document Released: 07/14/2003 Document Revised: 07/16/2011 Document Reviewed: 07/24/2013 [facility] Patient Information 2015 [place], [place]. This information is not intended to replace advice given to you by your health care provider. Make sure you discuss any questions you have with your health care provider.

## 2014-11-01 NOTE — Assessment & Plan Note (Signed)
It is not clear to me that anxiety is playing a role here and concern exists with the significant amount of benzos prescribed for her that does not seem to be improving her breathing, tolerance or QOL. Especially concern that she has some OSA which could be worsened by night time benzo usage and that she is so young and the dose is so high. Okay with continuing for now if pulmonary feels that her breathing is improving but would be hesitant to continue on long term basis as there are much better treatments for anxiety that have less side effect potential. Referral to cardiac rehab to see if fear and decreased exercise and weight may be playing a much stronger role than anxiety.

## 2014-11-01 NOTE — Assessment & Plan Note (Signed)
Referral placed to cardiac rehab as I do not feel that she is exercising to her potential due to fear and lack of confidence at home. Checking BNP to assess fluid status. Appears to be euvolemic on exam but weight up about 10 pounds since last visit.

## 2014-11-01 NOTE — Assessment & Plan Note (Signed)
Checking TSH today and adjust therapy as needed. Currently on synthroid 50 mcg daily but gaining weight and lack of energy.

## 2014-11-01 NOTE — Progress Notes (Signed)
Pre visit review using our clinic review tool, if applicable. No additional management support is needed unless otherwise documented below in the visit note. 

## 2014-11-01 NOTE — Progress Notes (Signed)
   Subjective:    Patient ID: Meredith Diaz, female    DOB: 1980/07/11, 34 y.o.   MRN: [id number]  HPI The patient is a 34 YO female who is coming in for follow up of her weight and fluid. She does have heart failure and follows with cardiology. She does not feel that her usual fluid pills are working as well lately. No swelling in her feet but is having some swelling in her stomach. Is up weight but not sure if this is from increase in weight or fluid. She is trying to lose weight.   Has had new problem of stomach flare in the last 1-2 months. More loose stools and crampy pain. Like when she had her gallbladder out. Took cholestyramine and it helped. Feels like she has seen green stools. Not getting up at night time to move her bowels. Has tried changing her diet which has not helped.   Saw pulmonology and they started her on clonazepam which she feels has helped. Not exercising much at all due to feelings of low stamina and fear that she will have problems.   Review of Systems  Constitutional: Positive for unexpected weight change. Negative for fever, activity change, appetite change and fatigue.  HENT: Negative.   Eyes: Negative.   Respiratory: Positive for shortness of breath. Negative for cough, chest tightness and wheezing.   Cardiovascular: Negative for chest pain and palpitations.  Gastrointestinal: Positive for abdominal pain, diarrhea and abdominal distention. Negative for nausea, vomiting, constipation and blood in stool.  Genitourinary: Negative.   Musculoskeletal: Negative.   Neurological: Negative.       Objective:   Physical Exam  Constitutional: She is oriented to person, place, and time. She appears well-developed and well-nourished. No distress.  Overweight  HENT:  Head: Normocephalic and atraumatic.  Eyes: EOM are normal.  Neck: Normal range of motion.  Cardiovascular: Normal rate and regular rhythm.   Pulmonary/Chest: Effort normal and breath sounds normal. No  respiratory distress. She has no wheezes. She has no rales.  Abdominal: Soft. Bowel sounds are normal. She exhibits no distension. There is no tenderness. There is no rebound.  Neurological: She is alert and oriented to person, place, and time. Coordination normal.  Skin: Skin is warm and dry.   Filed Vitals:   11/01/14 0806  BP: 108/78  Pulse: 98  Temp: 98.5 F (36.9 C)  TempSrc: Oral  Resp: 16  Height: 5' (1.524 m)  Weight: 205 lb (92.987 kg)  SpO2: 90%      Assessment & Plan:

## 2014-11-01 NOTE — Assessment & Plan Note (Signed)
Due to weight gain her BMI is now >40. Checking TSH, HgA1c, signs of fluid overload. Referral to cardiac rehab for supervised exercise to boost confidence in her ability to safely exercise and talked to her about the need to start exercising.

## 2014-11-17 ENCOUNTER — Ambulatory Visit (INDEPENDENT_AMBULATORY_CARE_PROVIDER_SITE_OTHER): Payer: Commercial Managed Care - PPO | Admitting: Cardiology

## 2014-11-17 ENCOUNTER — Encounter: Payer: Self-pay | Admitting: Cardiology

## 2014-11-17 VITALS — BP 140/90 | HR 96 | Ht 60.0 in | Wt 208.7 lb

## 2014-11-17 DIAGNOSIS — I10 Essential (primary) hypertension: Secondary | ICD-10-CM

## 2014-11-17 DIAGNOSIS — R0609 Other forms of dyspnea: Secondary | ICD-10-CM | POA: Diagnosis not present

## 2014-11-17 DIAGNOSIS — I5043 Acute on chronic combined systolic (congestive) and diastolic (congestive) heart failure: Secondary | ICD-10-CM | POA: Diagnosis not present

## 2014-11-17 DIAGNOSIS — I5042 Chronic combined systolic (congestive) and diastolic (congestive) heart failure: Secondary | ICD-10-CM | POA: Diagnosis not present

## 2014-11-17 DIAGNOSIS — G4730 Sleep apnea, unspecified: Secondary | ICD-10-CM

## 2014-11-17 DIAGNOSIS — I429 Cardiomyopathy, unspecified: Secondary | ICD-10-CM

## 2014-11-17 DIAGNOSIS — G478 Other sleep disorders: Secondary | ICD-10-CM

## 2014-11-17 LAB — BASIC METABOLIC PANEL
BUN: 14 mg/dL (ref 6–23)
CO2: 27 mEq/L (ref 19–32)
Calcium: 8.9 mg/dL (ref 8.4–10.5)
Chloride: 101 mEq/L (ref 96–112)
Creat: 0.78 mg/dL (ref 0.50–1.10)
Glucose, Bld: 78 mg/dL (ref 70–99)
Potassium: 4.5 mEq/L (ref 3.5–5.3)
Sodium: 139 mEq/L (ref 135–145)

## 2014-11-17 NOTE — Progress Notes (Signed)
PATIENT: Meredith Diaz MRN: [id number] DOB: May 28, 1980 PCP: Judie Bonus, MD  Clinic Note: Chief Complaint  Patient presents with  . Follow-up     chest pain-tightness, frequently shortness of breath, occassional edema ankles, feet and hands, pain in legs-hs pain with excessive walking, occassional cramping in legs, no lightheadedness, no dizziness   HPI: Meredith Diaz is a 34 y.o. female with a PMH of reported congenital heart disease status post surgical repair as a child (she describes the procedure as being "lifting an artery or vein and moving around. She did not have valve surgery that she knows) but with no major problems until 2013 when she was minimally to the hospital with a diagnosis of CHF and ejection fraction of roughly 25-30 %. She had about a month in the hospital.  Her heart failure was evaluated with cardiac catheterization and cardiac MRI with no gross abnormalities.  He has recently moved to [place], [place] from [facility], [place].  (Records from [facility] reviewed & PMH/PSH updated -- unfortunately, her childhood Op note is not available). I saw her initially in early November. She then saw Nada Boozer, NP for blood pressure and dyspnea.  She had an echocardiogram performed in December 2015 (see PMH/PSH)-her EF was moderately reduced with a range of 40-45%, being less than her 2014 echocardiogram. She has has moderate apical particular thickening pattern. (This was not commented on with her cardiac MRI)  I last saw her in December 2015 after her echocardiogram.  At that time her heart failure symptoms had improved, and her blood pressure is relatively well-controlled. We discussed sliding scale Lasix.  Interval History:  She presents today stating that she feels terrible - poorly functioning.  She notes intermittent swelling in both her hands and feet/ankles. He is profoundly dyspneic with exertion and notes symptoms of some orthopnea and PND. Significant  exertional dyspnea to the point where she can't do much the way of any activity.  She is 7 pounds up from what she was during her last visit here. Again, she is not noticing as brisk urine output response to her Lasix dose without taking Zaroxolyn. She also notes more frequent palpitations that are quite bothersome to her. Not overly long lasting, just recurrent. No syncope/near syncope or TIA/amaurosis fugax. She has felt we she may lose her balance.  She does deny any chest tightness or pressure with rest or exertion with exception of pressure from having a difficulty breathing. No rapid heart rates.  Occasional, but less frequent palpitations but no arrhythmias noted. No syncope/near-syncope or TIA/amaurosis fugax symptoms. She has had some dizziness spells - but these are better since her blood pressure is improved.  Cardiovascular ROS: positive for - dyspnea on exertion, edema, orthopnea, palpitations and paroxysmal nocturnal dyspnea negative for - chest pain, murmur, rapid heart rate or Syncope/near syncope or TIA/amaurosis fugax.   Past Medical History  Diagnosis Date  . Chronic diastolic congestive heart failure, NYHA class 2 07/2011    Echo: EF was 25-30%; No Sig CAD on CATH; No significant finding on Cardiac MRI; follow-up Ech0 2014 --  EF of 50%  . Asthma   . Thyroid disease   . Depression   . Anxiety   . Essential hypertension   . Stomach problems   . Hypertension   . Congenital heart disease, adult     status post repair in childhood -- was a failure to thrive child status post Cardec surgery ( unknown details)); neck MRI does  not suggest any abnormal finding  . Cardiomyopathy     Unclear etiology. 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 Cholecystectomy  2013  . Cardiac catheterization  08/2011     Normal Coronaries - LVEDP 32 mmHg  . Transthoracic echocardiogram  07/2011    Moderate concentric LVH, mildly dilated. EF 25-30% no diastolic dysfunction parameters to suggest elevated LAP  . Cardiac mri  April 2013    No suggestion of a congenital abnormality; EF ~ 52% - no regional wall motion abnormalities., no increased myocardial signal intensity. No perfusion abnormality.  . Transthoracic echocardiogram  December 2015    Last EF by echo 40-45% with global hypokinesis, mild LVH possible apical variant hypertrophic cardiomyopathy     Allergies  Allergen Reactions  . Lisinopril Swelling    Angioedema facial    Current Outpatient Prescriptions  Medication Sig Dispense Refill  . albuterol (VENTOLIN HFA) 108 (90 BASE) MCG/ACT inhaler Inhale 2 puffs into the lungs every 6 (six) hours as needed. 6.7 g 12  . budesonide-formoterol (SYMBICORT) 160-4.5 MCG/ACT inhaler Inhale 2 puffs into the lungs daily. 1 Inhaler 12  . cholestyramine light (PREVALITE) 4 GM/DOSE powder Take 1 packet (4 g total) by mouth 2 (two) times daily with a meal. 239.4 g 1  . clobetasol cream (TEMOVATE) 0.05 % as needed.    . clonazePAM (KLONOPIN) 1 MG tablet Take 1 tablet (1 mg total) by mouth 2 (two) times daily. 60 tablet 5  . dicyclomine (BENTYL) 20 MG tablet Take 1 tablet (20 mg total) by mouth as needed for spasms. 60 tablet 3  . furosemide (LASIX) 40 MG tablet Take 1.5 tablets (60 mg total) by mouth daily. 45 tablet 6  . hydrALAZINE (APRESOLINE) 25 MG tablet Take 1.5 tablets (37.5 mg total) by mouth 3 (three) times daily. (Patient taking differently: Take 37.5 mg by mouth 3 (three) times daily. Take 1 tablet (25mg ) PO three times daily.) 135 tablet 6  . levothyroxine (SYNTHROID, LEVOTHROID) 75 MCG tablet Take 1 tablet (75 mcg total) by mouth daily. 30 tablet 3  . metolazone (ZAROXOLYN) 2.5 MG tablet Take 1 tablet (2.5 mg total) by mouth daily. 30 tablet 6  . metoprolol succinate (TOPROL XL) 25 MG 24 hr tablet Take 1  tablet (25 mg total) by mouth daily. 30 tablet 11  . norethindrone-ethinyl estradiol 1/35 (ALAYCEN 1/35) tablet Take 1 tablet by mouth daily. 1 Package 12  . omeprazole (PRILOSEC) 40 MG capsule Take 40 mg by mouth daily.     . vitamin B-12 (CYANOCOBALAMIN) 1000 MCG tablet Take 1 tablet (1,000 mcg total) by mouth daily. 30 tablet 6   No current facility-administered medications for this visit.   No Significant Change to Family and Social History History  Substance Use Topics  . Smoking status: Never Smoker   . Smokeless tobacco: Not on file  . Alcohol Use: No   Family History  Problem Relation Age of Onset  . Depression Mother   . Hypertension Father   . Heart disease Maternal Grandfather   . Heart disease Paternal Grandmother   . Breast cancer Paternal Grandmother   . Colon cancer Maternal Grandmother   . Asthma Paternal Grandmother     ROS: A comprehensive Review of Systems - was performed Review of Systems  Constitutional: Positive for malaise/fatigue. Negative for weight loss.  Her weight is back up  HENT: Negative for nosebleeds.   Eyes: Positive for blurred vision.  Respiratory: Positive for cough (Mostly in the morning. Nonproductive) and shortness of breath. Negative for hemoptysis and wheezing.   Cardiovascular: Positive for palpitations (Intermittent. Rare), orthopnea (worse), leg swelling and PND. Negative for chest pain and claudication.  Gastrointestinal: Negative for blood in stool and melena.  Genitourinary: Negative for hematuria.  Musculoskeletal: Negative.  Negative for myalgias.  Neurological: Positive for dizziness. Negative for tremors, sensory change, speech change, focal weakness, seizures and loss of consciousness.  Endo/Heme/Allergies: Does not bruise/bleed easily.  Psychiatric/Behavioral: Positive for depression. The patient is nervous/anxious.   All other systems reviewed and are negative.  Wt Readings from Last 3 Encounters:  11/17/14 94.666  kg (208 lb 11.2 oz)  11/01/14 92.987 kg (205 lb)  10/14/14 91.173 kg (201 lb)   PHYSICAL EXAM BP 140/90 mmHg  Pulse 96  Ht 5' (1.524 m)  Wt 94.666 kg (208 lb 11.2 oz)  BMI 40.76 kg/m2 Physical Exam  Constitutional: She is oriented to person, place, and time. She appears well-developed and well-nourished. No distress.  Anxious, severely/morbidly obese  HENT:  Head: Normocephalic and atraumatic.  Mouth/Throat: Oropharynx is clear and moist. No oropharyngeal exudate.  Eyes: Conjunctivae and EOM are normal. Pupils are equal, round, and reactive to light. No scleral icterus.  Neck: Normal range of motion. Neck supple. No JVD present. No tracheal deviation present.  Cardiovascular: Normal rate, regular rhythm and normal heart sounds.   Occasional extrasystoles are present. PMI is not displaced.  Exam reveals no gallop and no decreased pulses.   No murmur heard. Pulmonary/Chest: Effort normal and breath sounds normal. No respiratory distress. She has no wheezes. She has no rales. She exhibits no tenderness.  Abdominal: Soft. Bowel sounds are normal. She exhibits no distension and no mass. There is no tenderness.  Musculoskeletal: Normal range of motion. She exhibits edema (Trace ankle/pedal and hand). She exhibits no tenderness.  Trace, minimal edema  Neurological: She is alert and oriented to person, place, and time. No cranial nerve deficit.  Skin: Skin is warm and dry. No rash noted. She is not diaphoretic. No erythema.  Psychiatric: Her behavior is normal. Judgment and thought content normal.  More anxious than usual  Nursing note and vitals reviewed.   Adult ECG Report -NSR, rate 96 bpm, LAA, NS ST-T abnormality concern for lateral ischemia. -- Subtle lateral T-wave inversions are more pronounced. Less pronounced features of LVH. Rate has reduced from previous EKG from October.  Recent Labs: none -- BMP ordered today  ASSESSMENT / PLAN: 34 year old young woman with prior history of  CHF supposedly improved after medical therapy with moderately reduced EF. Likely has diastolic dysfunction with possible hypertrophic cardiomyopathy. This was not seen on MRI so I don't think that this is a true diagnosis. Overall her heart failure symptoms now seem to have it being exacerbated.  Problem List Items Addressed This Visit    Acute on chronic combined systolic and diastolic heart failure, NYHA class 3 - Primary    I'm not sure what tipped her over the edge with this current episode. Her EF is only mild moderately reduced, but she probably has significant diastolic dysfunction based on her echo findings.  She has had a history of reduced EF in the setting of acute exacerbations. We will recheck an echocardiogram to ensure that has not changed. Open can also get some diastolic features. Plan for now is aggressive treatment with  increased diuresis.  Increased leg 6-60 mg a.m., 40 mg p.m. 3 days with Zaroxolyn pre-a.m. Dose.--> For the next 3 days take 60 mg morning 40 mg in the evening of Lasix (without Zaroxolyn)  Increased by mouth intake of potassium-rich foods.  Change time for Toprol to daily at bedtime.  Follow-up in 2 weeks with mid-level practitioner for monitoring..  If symptoms persist, would consider right heart catheterization to further guide therapy.   She will have a bmet checked today and in roughly 1 week.      Cardiomyopathy   Relevant Orders   EKG 12-Lead (Completed)   ECHOCARDIOGRAM COMPLETE (Completed)   Basic metabolic panel (Completed)   Basic metabolic panel   Chronic combined systolic and diastolic heart failure, NYHA class 2 (Chronic)    Inadequately controlled blood pressure given her reduced EF and diastolic dysfunction. I think we can potentially increase her beta blocker dose once we get her more euvolemic. She has a heart rate of 96 beats a minute. Would consider potentially either switching to a more hypertensive affective beta blocker. She is  currently on hydralazine which was supposedly increased.       Relevant Orders   EKG 12-Lead (Completed)   ECHOCARDIOGRAM COMPLETE (Completed)   Basic metabolic panel (Completed)   Basic metabolic panel   Essential hypertension (Chronic)    Not adequately controlled.  We will likely increase hydralazine dose. She is currently taking half of the planned 37.5 mg CAD.  Could consider attending ARB versus switching from metoprolol to carvedilol.      Morbid obesity    She needs to get busy with her diet and exercise. Has been limited with her exercise due to dyspnea and swelling.      Sleep-disordered breathing    If she does have true evidence of OSA, she would benefit from using CPAP.       Other Visit Diagnoses    Exertional dyspnea        Relevant Orders    EKG 12-Lead (Completed)    ECHOCARDIOGRAM COMPLETE (Completed)    Basic metabolic panel (Completed)    Basic metabolic panel       Orders Placed This Encounter  Procedures  . Basic metabolic panel  . Basic metabolic panel  . EKG 12-Lead  . ECHOCARDIOGRAM COMPLETE    Standing Status: Future     Number of Occurrences: 1     Standing Expiration Date: 02/16/2016    Order Specific Question:  Where should this test be performed    Answer:  [facility]    Order Specific Question:  Complete or Limited study?    Answer:  Complete    Order Specific Question:  With Image Enhancing Agent or without Image Enhancing Agent?    Answer:  With Image Enhancing Agent    Order Specific Question:  Reason for exam-Echo    Answer:  Cardiomyopathy-Unspecified  425.9 / I42.9    Order Specific Question:  Reason for exam-Echo    Answer:  Dyspnea  786.09 / R06.00   No orders of the defined types were placed in this encounter.   Please take LASIX (FUROSEMIDE) 60 mg in morning and 40 mg in evening for 3 days take metolazone before morning furosemide and then Next 3 days- take 60 MG in morning and 40 mg in the evening.  _ EAT  SOMETHING RICH IN POTASSIUM  Need labs today BMP then after the above 6 days medication (another BMP)  START taking METOPROLOL  SUCC 25 MG ( TOPROL) AT BEDTIME.  NEED THIS WEEK.Your physician has requested that you have an echocardiogram.  Your physician recommends that you schedule a follow-up appointment WITH AN EXTENDER NEXT WEEK ( CAN BE AT [facility] [address])  Followup: 2 weeks with Mid-Level Practitioner    Marykay Lex, M.D., M.S. Interventional Cardiologist   Pager # [phone]

## 2014-11-17 NOTE — Patient Instructions (Addendum)
Please take LASIX (FUROSEMIDE) 60 mg in morning and 40 mg in evening for 3 days take metolazone before morning furosemide and then Next 3 days- take 60 MG in morning and 40 mg in the evening.  _ EAT SOMETHING RICH IN POTASSIUM  Need labs today BMP then after the above 6 days medication (another BMP)  START taking METOPROLOL SUCC 25 MG ( TOPROL) AT BEDTIME.  NEED THIS WEEK.Your physician has requested that you have an echocardiogram. Echocardiography is a painless test that uses sound waves to create images of your heart. It provides your doctor with information about the size and shape of your heart and how well your heart's chambers and valves are working. This procedure takes approximately one hour. There are no restrictions for this procedure.  Your physician recommends that you schedule a follow-up appointment WITH AN EXTENDER NEXT WEEK ( CAN BE AT [facility] [address])

## 2014-11-18 ENCOUNTER — Ambulatory Visit (HOSPITAL_COMMUNITY)
Admission: RE | Admit: 2014-11-18 | Discharge: 2014-11-18 | Disposition: A | Discharge status: Home / Self Care | Payer: Commercial Managed Care - PPO | Source: Ambulatory Visit | Attending: Cardiology | Admitting: Cardiology

## 2014-11-18 DIAGNOSIS — I5042 Chronic combined systolic (congestive) and diastolic (congestive) heart failure: Secondary | ICD-10-CM | POA: Diagnosis not present

## 2014-11-18 DIAGNOSIS — R0609 Other forms of dyspnea: Secondary | ICD-10-CM

## 2014-11-18 DIAGNOSIS — I5043 Acute on chronic combined systolic (congestive) and diastolic (congestive) heart failure: Secondary | ICD-10-CM | POA: Insufficient documentation

## 2014-11-18 DIAGNOSIS — I429 Cardiomyopathy, unspecified: Secondary | ICD-10-CM

## 2014-11-18 NOTE — Assessment & Plan Note (Signed)
Not adequately controlled.  We will likely increase hydralazine dose. She is currently taking half of the planned 37.5 mg CAD.  Could consider attending ARB versus switching from metoprolol to carvedilol.

## 2014-11-18 NOTE — Assessment & Plan Note (Signed)
She needs to get busy with her diet and exercise. Has been limited with her exercise due to dyspnea and swelling.

## 2014-11-18 NOTE — Assessment & Plan Note (Signed)
Inadequately controlled blood pressure given her reduced EF and diastolic dysfunction. I think we can potentially increase her beta blocker dose once we get her more euvolemic. She has a heart rate of 96 beats a minute. Would consider potentially either switching to a more hypertensive affective beta blocker. She is currently on hydralazine which was supposedly increased.

## 2014-11-18 NOTE — Assessment & Plan Note (Addendum)
I'm not sure what tipped her over the edge with this current episode. Her EF is only mild moderately reduced, but she probably has significant diastolic dysfunction based on her echo findings.  She has had a history of reduced EF in the setting of acute exacerbations. We will recheck an echocardiogram to ensure that has not changed. Open can also get some diastolic features. Plan for now is aggressive treatment with increased diuresis.  Increased leg 6-60 mg a.m., 40 mg p.m. 3 days with Zaroxolyn pre-a.m. Dose.--> For the next 3 days take 60 mg morning 40 mg in the evening of Lasix (without Zaroxolyn)  Increased by mouth intake of potassium-rich foods.  Change time for Toprol to daily at bedtime.  Follow-up in 2 weeks with mid-level practitioner for monitoring..  If symptoms persist, would consider right heart catheterization to further guide therapy.   She will have a bmet checked today and in roughly 1 week.

## 2014-11-18 NOTE — Assessment & Plan Note (Signed)
If she does have true evidence of OSA, she would benefit from using CPAP.

## 2014-11-19 ENCOUNTER — Telehealth: Payer: Self-pay | Admitting: *Deleted

## 2014-11-19 NOTE — Telephone Encounter (Signed)
LEFT MESSAGE ON  DETAIL VOICEMAIL ANY QUESTION MAY CALL BACK

## 2014-11-19 NOTE — Telephone Encounter (Signed)
-----   Message from Marykay Lex, MD sent at 11/18/2014 11:26 PM EDT ----- Overall the left ventricular systolic function looks the same, but the diastolic dysfunction indeed looks worse. Elevated filling pressures. This is Consistent with what we felt were dealing with. I think the plan for diuresis and afterload reduction is important.  Thankfully, the ejection fraction is remaining the same.  DH

## 2014-11-19 NOTE — Telephone Encounter (Signed)
-----   Message from Marykay Lex, MD sent at 11/18/2014  9:26 AM EDT ----- Baseline chemistries look great. This was does now see any change with increased diuretic dosing  HARDING, Piedad Climes, M.D., M.S. Interventional Cardiologist   Pager # [phone]

## 2014-11-24 ENCOUNTER — Ambulatory Visit (INDEPENDENT_AMBULATORY_CARE_PROVIDER_SITE_OTHER): Payer: Commercial Managed Care - PPO | Admitting: Nurse Practitioner

## 2014-11-24 ENCOUNTER — Encounter: Payer: Self-pay | Admitting: Nurse Practitioner

## 2014-11-24 ENCOUNTER — Telehealth: Payer: Self-pay | Admitting: *Deleted

## 2014-11-24 VITALS — BP 100/78 | HR 101 | Ht 60.0 in | Wt 199.6 lb

## 2014-11-24 DIAGNOSIS — I5042 Chronic combined systolic (congestive) and diastolic (congestive) heart failure: Secondary | ICD-10-CM | POA: Diagnosis not present

## 2014-11-24 DIAGNOSIS — I429 Cardiomyopathy, unspecified: Secondary | ICD-10-CM | POA: Diagnosis not present

## 2014-11-24 DIAGNOSIS — R072 Precordial pain: Secondary | ICD-10-CM | POA: Diagnosis not present

## 2014-11-24 DIAGNOSIS — E876 Hypokalemia: Secondary | ICD-10-CM

## 2014-11-24 DIAGNOSIS — I428 Other cardiomyopathies: Secondary | ICD-10-CM

## 2014-11-24 LAB — BASIC METABOLIC PANEL
BUN: 28 mg/dL — AB (ref 6–23)
CALCIUM: 9.9 mg/dL (ref 8.4–10.5)
CO2: 40 mEq/L — ABNORMAL HIGH (ref 19–32)
Chloride: 85 mEq/L — ABNORMAL LOW (ref 96–112)
Creatinine, Ser: 1.29 mg/dL — ABNORMAL HIGH (ref 0.40–1.20)
GFR: 50.05 mL/min — ABNORMAL LOW (ref >60.00)
GLUCOSE: 109 mg/dL — AB (ref 70–99)
POTASSIUM: 2.7 meq/L — AB (ref 3.5–5.1)
Sodium: 136 mEq/L (ref 135–145)

## 2014-11-24 MED ORDER — POTASSIUM CHLORIDE CRYS ER 20 MEQ PO TBCR: EXTENDED_RELEASE_TABLET | ORAL | Status: DC

## 2014-11-24 NOTE — Telephone Encounter (Signed)
Advised patient of lab results, verbalized understanding  Patient actually scheduled for next Friday for GXT  Discussed with Thayer Ohm and will just recheck labs Monday 11/29/14 Labs scheduled

## 2014-11-24 NOTE — Patient Instructions (Signed)
Medication Instructions:  Your physician recommends that you continue on your current medications as directed. Please refer to the Current Medication list given to you today.   Labwork: Bmet   Testing/Procedures: Your physician has requested that you have an exercise tolerance test. For further information please visit [email or web address] Please also follow instruction sheet, as given. (Per Solon Palm ok to schedule with him a day that he is flex)  Follow-Up: Your physician recommends that you schedule a follow-up appointment in: 2 months with Dr.Harding   Any Other Special Instructions Will Be Listed Below (If Applicable).

## 2014-11-24 NOTE — Progress Notes (Signed)
Great note - glad to see that she diuresed.   Probably ok to back off on diuretic.  I was also thinking about "ischemic eval" & think GXT is a great choice. Do you want to keep her as your pet patient? :-)  Kinda hard to figure out.  Marykay Lex, MD

## 2014-11-24 NOTE — Progress Notes (Signed)
Patient Name: Meredith Diaz Date of Encounter: 11/24/2014  Primary Care Provider:  Judie Bonus, MD Primary Cardiologist:  Ranae Palms, MD   Chief Complaint  34 y/o female with a h/o congenital heart disease and non-ischemic cardiomyopathy, that presents for f/u 2/2 recent CHF requiring diuretic adjustment.  Past Medical History   Past Medical History  Diagnosis Date  . Chronic combined systolic and diastolic CHF (congestive heart failure) 07/2011    a. Prior Echo: EF was 25-30%; b. No Sig CAD on CATH; c. No significant finding on Cardiac MRI; d. follow-up Echo 2014 --  EF of 50%;  e. 03/2014 Echo: EF 40-45%, gr 1 DD; f. 11/2014 Echo: EF 40-45%, Gr 2 DD.  [name] Asthma   . Thyroid disease   . Depression   . Anxiety   . Stomach problems   . Essential hypertension   . Congenital heart disease, adult     a. 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  . Nonischemic cardiomyopathy     a. unclear etiology;  b. 01/2012 Cardiac MRI: no infiltrative pathology, EF 52%;  b. 11/2014 Echo: EF 40-45%, gr 2 DD.  . Morbid obesity    Past Surgical History  Procedure Laterality Date  . Cholecystectomy  2013  . Cardiac catheterization  08/2011    Normal Coronaries - LVEDP 32 mmHg  . Transthoracic echocardiogram  07/2011    Moderate concentric LVH, mildly dilated. EF 25-30% no diastolic dysfunction parameters to suggest elevated LAP  . Cardiac mri  April 2013    No suggestion of a congenital abnormality; EF ~ 52% - no regional wall motion abnormalities., no increased myocardial signal intensity. No perfusion abnormality.  . Transthoracic echocardiogram  December 2015    Last EF by echo 40-45% with global hypokinesis, mild LVH possible apical variant hypertrophic cardiomyopathy     Allergies  Allergies  Allergen Reactions  . Lisinopril Swelling    Angioedema facial     HPI  34 y/o female with the above complex problem list.  She was seen in clinic last week secondary to dyspnea, orthopnea, and wt gain.  She was felt to have mild volume overload and was advised to increase her lasix from 60 daily to 60 in the AM and 40 in the PM.  She was also told to take metolazone 2.5 mg daily x 3 days.  After changing of her diaphoretic regimen, she did note a decrease in abdominal bloating and also improvement in baseline dyspnea on exertion. She had repeat echocardiogram on July 14, showing stable LV dysfunction with an EF of 40-45%. Her diastolic function worsened slightly and now is graded at grade 2. She has not had any PND and orthopnea has improved. She still experiences dyspnea on exertion with higher levels of exertion such as walking up a flight of stairs. She denies early satiety or edema. She does watch her sodium intake closely and for the most part prepared her own meals. Over the past few days, she says her mouth has been very dry and she thinks she may be dehydrated. She is also noticed that her urine output has dropped off since stopping metolazone. She is concerned about the long-term management of her volume and would like to avoid frequent diaphoretic adjustments. She continues to have constant left-sided back discomfort that radiates around the left axilla and into the left abdomen. This  can be worse with deep breathing or certain position changes.  Home Medications  Prior to Admission medications   Medication Sig Start Date End Date Taking? Authorizing Provider  albuterol (VENTOLIN HFA) 108 (90 BASE) MCG/ACT inhaler Inhale 2 puffs into the lungs every 6 (six) hours as needed. 01/28/14  Yes Judie Bonus, MD  budesonide-formoterol [facility]) 160-4.5 MCG/ACT inhaler Inhale 2 puffs into the lungs daily. 01/28/14  Yes Judie Bonus, MD  cholestyramine light (PREVALITE) 4 GM/DOSE powder Take 1 packet (4 g total) by mouth 2 (two) times daily with a meal.  11/01/14  Yes Judie Bonus, MD  clobetasol cream (TEMOVATE) 0.05 % Apply 1 application topically as needed (for Psoriasis).   Yes Historical Provider, MD  clonazePAM (KLONOPIN) 1 MG tablet Take 1 tablet (1 mg total) by mouth 2 (two) times daily. 08/26/14  Yes Michele Mcalpine, MD  dicyclomine (BENTYL) 20 MG tablet Take 1 tablet (20 mg total) by mouth as needed for spasms. 01/28/14  Yes Judie Bonus, MD  furosemide (LASIX) 40 MG tablet Take 1.5 tablets (60 mg total) by mouth daily. 03/18/14  Yes Leone Brand, NP  hydrALAZINE (APRESOLINE) 25 MG tablet Take 25 mg by mouth 3 (three) times daily.   Yes Historical Provider, MD  levothyroxine (SYNTHROID, LEVOTHROID) 75 MCG tablet Take 1 tablet (75 mcg total) by mouth daily. 11/01/14  Yes Judie Bonus, MD  metolazone (ZAROXOLYN) 2.5 MG tablet Take 2.5 mg by mouth as needed.   Yes Historical Provider, MD  metoprolol succinate (TOPROL XL) 25 MG 24 hr tablet Take 1 tablet (25 mg total) by mouth daily. 03/08/14  Yes Marykay Lex, MD  norethindrone-ethinyl estradiol 1/35 (ALAYCEN 1/35) tablet Take 1 tablet by mouth daily. 01/28/14  Yes Judie Bonus, MD  omeprazole (PRILOSEC) 40 MG capsule Take 40 mg by mouth daily.   Yes Historical Provider, MD  vitamin B-12 (CYANOCOBALAMIN) 1000 MCG tablet Take 1 tablet (1,000 mcg total) by mouth daily. 04/13/14  Yes Judie Bonus, MD    Review of Systems  Overall, she is doing better. She does continue to have mild dyspnea on exertion, occasional orthopnea, constant left-sided back and upper abdominal discomfort that is worse with position changes deep breathing.  All other systems reviewed and are otherwise negative except as noted above.  Physical Exam  VS:  BP 100/78 mmHg  Pulse 101  Ht 5' (1.524 m)  Wt 199 lb 9.6 oz (90.538 kg)  BMI 38.98 kg/m2  SpO2 99%  LMP 11/10/2014 , BMI Body mass index is 38.98 kg/(m^2). GEN: Well nourished, well developed, in no acute distress. HEENT:  normal. Neck: Supple, no JVD, carotid bruits, or masses. Cardiac: RRR, no murmurs, rubs, or gallops. No clubbing, cyanosis, edema.  Radials/DP/PT 2+ and equal bilaterally.  Respiratory:  Respirations regular and unlabored, clear to auscultation bilaterally. GI: Soft, nontender, nondistended, BS + x 4. MS: no deformity or atrophy. Skin: warm and dry, no rash. Neuro:  Strength and sensation are intact. Psych: Normal affect.  Accessory Clinical Findings  Basic metabolic profile is pending today  Assessment & Plan  1.  Chronic combined systolic and diastolic just heart failure/nonischemic cardiomyopathy: Her weight is down 9 pounds since her last visit and she is euvolemic on exam today. She is currently on Lasix 60 mg in the morning and 40 mg in the evening and has had some orthostatic lightheadedness as well as dry mouth. I suspect that she may be dehydrated. We will  check a basic metabolic panel today and I recommended that she reduce her Lasix dose back to 60 mg once daily. Further, we discussed that if she is not responding to Lasix as she use to and as she primarily expresses gut edema, we may want to consider switching her from Lasix to torsemide. I will see what her labs show today and then make that decision. Her echo showed stable LV dysfunction with an EF of 40-45% and grade 2 diastolic dysfunction. Her blood pressure is low and as above, she is mildly orthostatic. Her heart rate typically runs in the 90s and is in the low 100s today, which may be related to dehydration. As her volume status is stable but she continues to experience dyspnea on exertion, I question to what extent deconditioning is playing a role in some of her symptoms. She says that she has spoken with her primary care provider and a referral has been made for cardiopulmonary rehabilitation and I think that this is an excellent idea. As been sometimes she has exercised, and because she experiences atypical chest pain, I will  obtain a baseline exercise treadmill test to gauge her exercise tolerance and ensure that she would tolerate rehabilitation.  2. Atypical chest/back/abdominal pain: Patient has had constant discomfort involving her left back, chest and upper abdomen for greater than a week. This is reproducible with deep breathing and position changes. This is very unlikely to be ischemia. As she is planning on spending exercise, we will obtain an exercise treadmill to gauge her exercise tolerance and evaluate for ECG changes.  3. Morbid obesity: Likely contributing to some degree of her dyspnea. She is planning on participating in cardiopulmonary rehabilitation.  4. Disposition: Obtain basic metabolic panel today and treadmill within the next 2 weeks. I will consider switching her from Lasix to torsemide pending her renal function.  Nicolasa Ducking, NP 11/24/2014, 1:01 PM

## 2014-11-24 NOTE — Telephone Encounter (Signed)
-----   Message from Ok Anis, NP sent at 11/24/2014  4:28 PM EDT ----- Please let her know that as we suspected, she is a little dehydrated.  That being the case, please have her hold her lasix tomorrow and then resume at 60 mg daily on Friday.  She should also be drinking fluids when she is thirsty and not intentionally restricting fluids (we discussed this today).  Can we please send in a Rx for kdur 40 meq.  I'd like her to take 2 doses today and then start taking it daily tomorrow.  She will need a f/u bmet and can have it when she comes in on Friday for her GXT.

## 2014-11-25 ENCOUNTER — Telehealth: Payer: Self-pay | Admitting: *Deleted

## 2014-11-25 NOTE — Telephone Encounter (Signed)
SENT FAXED --SIGNED CARDIAC REHAB PHASE II ORDER. PCP DR Dorise Hiss , ORIGINAL ORDER CARDIAC REHAB  - REHAB WANTED CLEARANCE FOR REHAB

## 2014-11-29 ENCOUNTER — Telehealth: Payer: Self-pay

## 2014-11-29 ENCOUNTER — Other Ambulatory Visit (INDEPENDENT_AMBULATORY_CARE_PROVIDER_SITE_OTHER): Payer: Commercial Managed Care - PPO | Admitting: *Deleted

## 2014-11-29 DIAGNOSIS — E876 Hypokalemia: Secondary | ICD-10-CM

## 2014-11-29 LAB — BASIC METABOLIC PANEL
BUN: 15 mg/dL (ref 6–23)
CO2: 28 mEq/L (ref 19–32)
CREATININE: 0.89 mg/dL (ref 0.40–1.20)
Calcium: 8.8 mg/dL (ref 8.4–10.5)
Chloride: 99 mEq/L (ref 96–112)
GFR: 76.8 mL/min (ref >60.00)
GLUCOSE: 118 mg/dL — AB (ref 70–99)
Potassium: 3.9 mEq/L (ref 3.5–5.1)
Sodium: 137 mEq/L (ref 135–145)

## 2014-11-29 NOTE — Addendum Note (Signed)
Addended by: Tonita Phoenix on: 11/29/2014 08:24 AM   Modules accepted: Orders

## 2014-11-29 NOTE — Telephone Encounter (Signed)
Called patient about lab results. Per Meredith Givens NP, Bun and creat have improved back to normal. K is better as well. Continue current lasix dose and potassium supplementation. She should not be withholding fluids, but drinking when thirsty as we discussed in clinic. Please check to make sure that her volume status has been stable. I'll see her on Friday for an ETT.    Patient stated she was still having SOB and thinks it is worse then it was on Wednesday. Also patient states she has gained 5 pounds since her visit. Will forward to Meredith Givens NP.

## 2014-11-30 NOTE — Telephone Encounter (Signed)
If wt is up this AM, please have her take an additional 40 mg of lasix today.  Please also advise that if wt goes up 2 lbs in 24 hrs, she may take a prn dose of lasix 40 that afternoon (in addition to the 60 that she takes every morning).  Can you please change her GXT on Friday to an office visit as I'm not sure that she's going to be able to walk well if she still has some volume.  We will likely need to change up her diuretic regimen.  Thanks,  Thayer Ohm

## 2014-12-01 NOTE — Telephone Encounter (Signed)
Called patient with advisement from Ward Givens, NP, If wt is up this AM, please have her take an additional 40 mg of lasix today. Please also advise that if wt goes up 2 lbs in 24 hrs, she may take a prn dose of lasix 40 that afternoon (in addition to the 60 that she takes every morning). Can you please change her GXT on Friday to an office visit as I'm not sure that she's going to be able to walk well if she still has some volume. We will likely need to change up her diuretic regimen. Patient agreed to plan and will have an office visit on Friday with Ward Givens NP instead of exercise stress test.

## 2014-12-03 ENCOUNTER — Encounter: Payer: Self-pay | Admitting: Nurse Practitioner

## 2014-12-03 ENCOUNTER — Ambulatory Visit (INDEPENDENT_AMBULATORY_CARE_PROVIDER_SITE_OTHER): Payer: Commercial Managed Care - PPO | Admitting: Nurse Practitioner

## 2014-12-03 VITALS — BP 112/80 | HR 88 | Ht 60.0 in | Wt 201.0 lb

## 2014-12-03 DIAGNOSIS — I5042 Chronic combined systolic (congestive) and diastolic (congestive) heart failure: Secondary | ICD-10-CM

## 2014-12-03 DIAGNOSIS — I429 Cardiomyopathy, unspecified: Secondary | ICD-10-CM

## 2014-12-03 DIAGNOSIS — I428 Other cardiomyopathies: Secondary | ICD-10-CM

## 2014-12-03 DIAGNOSIS — R0789 Other chest pain: Secondary | ICD-10-CM | POA: Diagnosis not present

## 2014-12-03 MED ORDER — TORSEMIDE 20 MG PO TABS: 40.0000 mg | ORAL_TABLET | Freq: Every day | ORAL | Status: DC

## 2014-12-03 NOTE — Patient Instructions (Signed)
Medication Instructions:  1) STOP taking Lasix 2) START taking Torsemide 20mg - 2 tabs (40mg ) once daily  Labwork: BMET next week on same day as GXT  Testing/Procedures: Your physician has requested that you have an exercise tolerance test next Wednesday or Thursday with Ward Givens, NP (Ok per Thayer Ohm to schedule on his FLEX schedule next week). For further information please visit [email or web address] Please also follow instruction sheet, as given.   Follow-Up: Follow up will be based on GXT  Any Other Special Instructions Will Be Listed Below (If Applicable).

## 2014-12-03 NOTE — Progress Notes (Signed)
Patient Name: Meredith Diaz Date of Encounter: 12/03/2014  Primary Care Provider:  Judie Bonus, MD Primary Cardiologist:  Ranae Palms, MD   Chief Complaint  34 y/o female with a h/o congenital heart disease and non-ischemic cardiomyopathy, that presents for f/u 2/2 recent CHF requiring diuretic adjustment.  Past Medical History   Past Medical History  Diagnosis Date  . Chronic combined systolic and diastolic CHF (congestive heart failure) 07/2011    a. Prior Echo: EF was 25-30%; b. No Sig CAD on CATH; c. No significant finding on Cardiac MRI; d. follow-up Echo 2014 --  EF of 50%;  e. 03/2014 Echo: EF 40-45%, gr 1 DD; f. 11/2014 Echo: EF 40-45%, Gr 2 DD.  [name] Asthma   . Thyroid disease   . Depression   . Anxiety   . Stomach problems   . Essential hypertension   . Congenital heart disease, adult     a. 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  . Nonischemic cardiomyopathy     a. unclear etiology;  b. 01/2012 Cardiac MRI: no infiltrative pathology, EF 52%;  b. 11/2014 Echo: EF 40-45%, gr 2 DD.  . Morbid obesity    Past Surgical History  Procedure Laterality Date  . Cholecystectomy  2013  . Cardiac catheterization  08/2011    Normal Coronaries - LVEDP 32 mmHg  . Transthoracic echocardiogram  07/2011    Moderate concentric LVH, mildly dilated. EF 25-30% no diastolic dysfunction parameters to suggest elevated LAP  . Cardiac mri  April 2013    No suggestion of a congenital abnormality; EF ~ 52% - no regional wall motion abnormalities., no increased myocardial signal intensity. No perfusion abnormality.  . Transthoracic echocardiogram  December 2015    Last EF by echo 40-45% with global hypokinesis, mild LVH possible apical variant hypertrophic cardiomyopathy     Allergies  Allergies  Allergen Reactions  . Lisinopril Swelling    Angioedema facial     HPI  34 y/o female with the above complex problem list. She was seen in clinic 2 weeks ago secondary to dyspnea, orthopnea, and wt gain. She was felt to have mild volume overload and was advised to increase her lasix from 60 daily to 60 in the AM and 40 in the PM. She was also told to take metolazone 2.5 mg daily x 3 days. After changing of her diuretic regimen, she did note a decrease in abdominal bloating and also improvement in baseline dyspnea on exertion. She had repeat echocardiogram on July 14, showing stable LV dysfunction with an EF of 40-45%. Her diastolic function worsened slightly and now is graded at grade 2.  I saw her back in clinic ~1 wk ago and she noted improvement in DOE and orthopnea, but was clinically orthostatic.  Subsequent labs confirmed that she was a little dry and her lasix dose was reduced back to her prior dose of 60 daily.  She presented for f/u labs a few days later and reported wt gain and DOE.  We had her go back to lasix 60a/40p and switched her appt today from a GXT to an office visit, thinking that she may not be able to give max effort if she is still volume overloaded.    Today, she is feeling somewhat better.  Her wt is down from 204 on Monday to 201 today.  She has chronic orthopnea  but notes that her DOE has improved some.  She still doesn't feel like she's responding to lasix in the same fashion as she used to and has been using metolazone prn on a daily basis.  She does continue to have constant, mild left-sided back discomfort that radiates around the left axilla, and into the left abdomen.  This is worse with deep breathing and certain position changes.  Home Medications  Prior to Admission medications   Medication Sig Start Date End Date Taking? Authorizing Provider  albuterol (PROVENTIL HFA;VENTOLIN HFA) 108 (90 BASE) MCG/ACT inhaler Inhale 2 puffs into the lungs as needed for wheezing or shortness of breath.   Yes Historical Provider, MD   budesonide-formoterol (SYMBICORT) 160-4.5 MCG/ACT inhaler Inhale 2 puffs into the lungs daily. 01/28/14  Yes Judie Bonus, MD  cholestyramine light (PREVALITE) 4 GM/DOSE powder Take 4 g by mouth as needed (DIARRHEA DUE TO BILE ACIDS).    Yes Historical Provider, MD  clobetasol cream (TEMOVATE) 0.05 % Apply 1 application topically as needed (for Psoriasis).   Yes Historical Provider, MD  clonazePAM (KLONOPIN) 1 MG tablet Take 1 tablet (1 mg total) by mouth 2 (two) times daily. 08/26/14  Yes Michele Mcalpine, MD  dicyclomine (BENTYL) 20 MG tablet Take 1 tablet (20 mg total) by mouth as needed for spasms. 01/28/14  Yes Judie Bonus, MD  furosemide (LASIX) 40 MG tablet Take 1.5 tablets (60 mg total) by mouth daily. 03/18/14  Yes Leone Brand, NP  hydrALAZINE (APRESOLINE) 25 MG tablet Take 25 mg by mouth 3 (three) times daily.   Yes Historical Provider, MD  levothyroxine (SYNTHROID, LEVOTHROID) 75 MCG tablet Take 1 tablet (75 mcg total) by mouth daily. 11/01/14  Yes Judie Bonus, MD  metolazone (ZAROXOLYN) 2.5 MG tablet Take 2.5 mg by mouth as needed (swelling).    Yes Historical Provider, MD  metoprolol succinate (TOPROL XL) 25 MG 24 hr tablet Take 1 tablet (25 mg total) by mouth daily. 03/08/14  Yes Marykay Lex, MD  norethindrone-ethinyl estradiol 1/35 (ALAYCEN 1/35) tablet Take 1 tablet by mouth daily. 01/28/14  Yes Judie Bonus, MD  omeprazole (PRILOSEC) 40 MG capsule Take 40 mg by mouth daily.   Yes Historical Provider, MD  potassium chloride SA (K-DUR,KLOR-CON) 20 MEQ tablet Take 20 mEq by mouth 2 (two) times daily.   Yes Historical Provider, MD  vitamin B-12 (CYANOCOBALAMIN) 1000 MCG tablet Take 1 tablet (1,000 mcg total) by mouth daily. 04/13/14  Yes Judie Bonus, MD    Review of Systems  As above, she has chronic orthopnea and mild, constant left back/chest/abdominal discomfort.  She also has chronic DOE, which is better today.  All other systems reviewed and are  otherwise negative except as noted above.  Physical Exam  VS:  BP 112/80 mmHg  Pulse 88  Ht 5' (1.524 m)  Wt 201 lb (91.173 kg)  BMI 39.26 kg/m2  LMP 11/10/2014 , BMI Body mass index is 39.26 kg/(m^2). GEN: Well nourished, well developed, in no acute distress. HEENT: normal. Neck: Supple, no JVD, carotid bruits, or masses. Cardiac: RRR, no murmurs, rubs, or gallops. No clubbing, cyanosis, edema.  Radials/DP/PT 2+ and equal bilaterally.  Respiratory:  Respirations regular and unlabored, clear to auscultation bilaterally. GI: Soft, nontender, nondistended, BS + x 4. MS: no deformity or atrophy. Skin: warm and dry, no rash. Neuro:  Strength and sensation are intact. Psych: Normal affect.  Accessory Clinical Findings  None  Assessment & Plan  1.  Chronic combined systolic and diastolic CHF/NICM:  Last week we backed off on her lasix b/c she was clinically dry and orthostatic and also had mild prerenal azotemia by bmet.  Following that, she put on more weight with worsening dyspnea, that has improved only since increasing lasix back to 60 am, 40 pm, and she has also been taking metolazone 2.5mg  daily, as opposed to just prn.  She notes that she doesn't feel like she responds to lasix the way that she used to and thus has been using the metolazone.  We discussed how that is not likely ideal, especially since she's shown that she's prone to dehydration. She reports dietary compliance with a low sodium diet.  In the setting of poor response to lasix and since her volume overload seems to result more so in abdominal bloating and distension than lower ext edema, I am switching her from lasix to torsemide 40 mg daily.  As prev planned, I will reschedule her GXT to eval exercise tolerance, for next week.  She still plans to participate in cardiopulmonary rehab and a GXT will serve as a good baseline study.  2.  Atypical chest/back/abdominal pain:  This has been constant and reproducible with deep  breathing and position changes.  Unlikely to be ischemia.  GXT next week.  3.  Morbid Obesity:  Likely contributing to some degree of her dyspnea. She is planning on participating in cardiopulmonary rehabilitation.  4. Disposition: GXT with bmet next week.   Nicolasa Ducking, NP 12/03/2014, 2:23 PM

## 2014-12-09 ENCOUNTER — Ambulatory Visit (INDEPENDENT_AMBULATORY_CARE_PROVIDER_SITE_OTHER): Payer: Commercial Managed Care - PPO

## 2014-12-09 ENCOUNTER — Other Ambulatory Visit (INDEPENDENT_AMBULATORY_CARE_PROVIDER_SITE_OTHER): Payer: Commercial Managed Care - PPO

## 2014-12-09 ENCOUNTER — Ambulatory Visit: Payer: Commercial Managed Care - PPO | Admitting: Nurse Practitioner

## 2014-12-09 ENCOUNTER — Encounter (INDEPENDENT_AMBULATORY_CARE_PROVIDER_SITE_OTHER): Payer: Commercial Managed Care - PPO | Admitting: Nurse Practitioner

## 2014-12-09 ENCOUNTER — Telehealth: Payer: Self-pay | Admitting: *Deleted

## 2014-12-09 DIAGNOSIS — E876 Hypokalemia: Secondary | ICD-10-CM | POA: Diagnosis not present

## 2014-12-09 DIAGNOSIS — I5042 Chronic combined systolic (congestive) and diastolic (congestive) heart failure: Secondary | ICD-10-CM

## 2014-12-09 DIAGNOSIS — R0789 Other chest pain: Secondary | ICD-10-CM

## 2014-12-09 LAB — BASIC METABOLIC PANEL
BUN: 28 mg/dL — ABNORMAL HIGH (ref 6–23)
CALCIUM: 9.4 mg/dL (ref 8.4–10.5)
CO2: 34 mEq/L — ABNORMAL HIGH (ref 19–32)
CREATININE: 1.2 mg/dL (ref 0.40–1.20)
Chloride: 88 mEq/L — ABNORMAL LOW (ref 96–112)
GFR: 54.39 mL/min — AB (ref >60.00)
GLUCOSE: 118 mg/dL — AB (ref 70–99)
Potassium: 2.8 mEq/L — CL (ref 3.5–5.1)
Sodium: 135 mEq/L (ref 135–145)

## 2014-12-09 LAB — EXERCISE TOLERANCE TEST
CHL CUP MPHR: 186 {beats}/min
CHL RATE OF PERCEIVED EXERTION: 18
CSEPED: 3 min
CSEPEW: 5.4 METS
CSEPHR: 89 %
Exercise duration (sec): 43 s
Peak HR: 166 {beats}/min
Rest HR: 120 {beats}/min

## 2014-12-09 MED ORDER — TORSEMIDE 20 MG PO TABS: 20.0000 mg | ORAL_TABLET | Freq: Every day | ORAL | Status: DC

## 2014-12-09 NOTE — Telephone Encounter (Signed)
Left pt a message to call back. Pt's K+ level is 2.8; Ward Givens NP aware, he recommends for pt; if she is taking potassium  20 mEq twice a day to increased  It to 40 mEq twice a day. Pt to decreased Torsemide to 20 mg daily.

## 2014-12-09 NOTE — Telephone Encounter (Signed)
Pt is aware of K+ level 2.8. Pt takes Potassium 40 mEq once daily instead of the 20 mEq twice a day. Pt is aware to take potassium 40 mEq twice a day, also to decreased the Torsemide to 20 mg once daily. Pt to have repeat BMET lab work next Thursday  12/16/14. Pt verbalized understanding.

## 2014-12-10 ENCOUNTER — Telehealth: Payer: Self-pay | Admitting: *Deleted

## 2014-12-10 NOTE — Telephone Encounter (Signed)
-----   Message from Marykay Lex, MD sent at 12/09/2014  9:55 PM EDT ----- Overall - the stress test is "Negative" for heart artery disease ("ischemia").   No issue with Heart Rate response. However, the test does show: poor exercise tolerance & a Hypertensive (BP increases too much) response to exercise.  Pretty much means that we need to keep BP down.  Keep you diuresed (which will help BP).  & Need to encourage more exercise.    DH

## 2014-12-10 NOTE — Telephone Encounter (Signed)
LEFT DETAIL MESSAGE ON PATIENT'S SECURE VOICEMAL ANY QUESTION MAY CALL BACK

## 2014-12-14 ENCOUNTER — Encounter: Payer: Self-pay | Admitting: Pulmonary Disease

## 2014-12-14 ENCOUNTER — Ambulatory Visit (INDEPENDENT_AMBULATORY_CARE_PROVIDER_SITE_OTHER): Payer: Commercial Managed Care - PPO | Admitting: Pulmonary Disease

## 2014-12-14 VITALS — BP 116/74 | HR 100 | Temp 97.6°F | Wt 203.6 lb

## 2014-12-14 DIAGNOSIS — I429 Cardiomyopathy, unspecified: Secondary | ICD-10-CM

## 2014-12-14 DIAGNOSIS — J4530 Mild persistent asthma, uncomplicated: Secondary | ICD-10-CM

## 2014-12-14 DIAGNOSIS — Z9989 Dependence on other enabling machines and devices: Secondary | ICD-10-CM

## 2014-12-14 DIAGNOSIS — M4120 Other idiopathic scoliosis, site unspecified: Secondary | ICD-10-CM

## 2014-12-14 DIAGNOSIS — G4733 Obstructive sleep apnea (adult) (pediatric): Secondary | ICD-10-CM | POA: Diagnosis not present

## 2014-12-14 DIAGNOSIS — I428 Other cardiomyopathies: Secondary | ICD-10-CM

## 2014-12-14 DIAGNOSIS — I10 Essential (primary) hypertension: Secondary | ICD-10-CM

## 2014-12-14 DIAGNOSIS — J984 Other disorders of lung: Secondary | ICD-10-CM

## 2014-12-14 DIAGNOSIS — I5042 Chronic combined systolic (congestive) and diastolic (congestive) heart failure: Secondary | ICD-10-CM

## 2014-12-14 NOTE — Progress Notes (Deleted)
Subjective:     Patient ID: Meredith Diaz, female   DOB: December 12, 1980, 34 y.o.   MRN: [id number]  HPI   Review of Systems     Objective:   Physical Exam     Assessment:     ***    Plan:     ***

## 2014-12-14 NOTE — Progress Notes (Addendum)
Subjective:     Patient ID: Meredith Diaz, female   DOB: February 12, 1981, 34 y.o.   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, Programmer, multimedia for [employer], pt of DrKollar & referred for pulmonary evaluation w/ hx asthma- she notes increased DOE & no relief from her inhalers> Joely is a never smoker & was diagnosed w/ asthma first in 57 at age 14 or so when [facility] for pneumonia (Cariology said she was hosp for CHF w/ EF=25-30% and was in hosp for 23mo) & she's had trouble ever since then, disch on NEBS;  Treated over the yrs w/ MDIs, Symbicort/Advair (the latter didn't help), last had Pred before 2013 hosp;  She was allergy tested in middle school & essentially neg x mild grass reaction by her memory;  She gets "sinus infections" a couple of times each yr (she thinks this is her main trigger) & uses Claritin/ Flonase prn;  Symptoms included SOB/ DOE w/ min activ, feels like she can't get a deep breath/ can't get enough air "in"/ not satisfied breathing (states this sensation is present all the time/constant, & she notes "gasping"); states she notes some wheezing when supine at night & some voice issues (?VCD, tight burning in throat) but denies reflux/ dysphagia/ nocturnal cough or choking (note- she has IBS & s/pGB 2013)... She also c/o sleep issues> can't sleep well while lying down, husb c/o her "wheezing"/ some snoring (but does not indicate that she stops breathing)/ restless sleeper/ leg jerks; she does have some daytime sleepiness issues w/ incr fatigue at work and she'll take naps on weekends; ESS=12... Current Meds> Symbicort160- 2puffs daily, AlbutHFA for prn use...        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 Cumulative data from cardiac evals as follows, she was told  her cardiac issues are ?congenital, ?virus damaged her heart>> she has never had cardiac rehab etc...   CXR 2013 at [facility] showed cardiomegaly, biapical pleural thickening, otherw clear & NAD...  Cardiac MRI from [facility] in 2013 showed EF=52% w/ mild global HK, min AI, RV- wnl, Ao- wnl, mild pectus excavqatum w/ deviation of cardiac apex...  Cath was done in 2013 but I cannot find that report (said no signif CAD)...  EKG 11/15 showed STachy, rate 103, ?LAE, suggestive LVH, NSSTTWA...   2DEcho 11/15 by DrHarding showed mild LVH, ?poss apical variant hypertrophic cardiomyopathy, EF=40-45% w/ diffuse HK, Gr1DD, MV- mildly thickened leaflets and trivMR, norm LA size...  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 Notes hx anorexia/ depression when in high school w/ lowest wt=80 lbs; psyche rx & counseling resulted in wt gain & she was ~150 lbs in college; gained to 180# after college & lost wt to 165 prior to her marriage at age 48; now she is 34 y/o & has gained to max 203#... She is allergic to ACE inhibitors w/ prev angioedema...   EXAM reveals Afeb, VSS, O2sat=93% on RA at rest;  HEENT- neg, mallampati 2, voice sl hoarse;  Chest- decr BS  bilat, clear, decr diaph excursions by percussion, freq sign breaths;  Heart- RRR w/p m/r/g;  Ext- no c/c/e...  Recent LABS> Chems- wnl;  CBC- ok w/ Hg=12.4, WBC=14K;  TSH=2.62;  B12 level= 180-250 (on oral B12- 1097mcg/d)...  CXR 08/16/14 showed norm heart size, clear lungs, NAD...  Spirometry 08/16/14> poor effort (breathing "high" in her lung volumes)> FVC=0.84 (27%), FEV1=0.66 (24%), %1sec=78, mid-flows reduced at 18% predicted; Test indicated severe restriction w/ small airways dis...   Ambulatory oxygen saturations>  O2sat=95% on RA at rest w/ pulse=89/min;  She walked 2 laps and stopped due to SOB- O2sat=83% w/ pulse 121/min...  FENO= 10ppb (results <25ppd implies absence of eosinophilic airway inflammation).  Epworth Sleepiness Score= 12 (indicating that she may be excessively sleepy depending on the situation... IMP/PLAN>>  Charmagne has been diagnosed w/ asthma over the last 20+yrs but her current symptoms and spirometry looks more restricted- prob due to "chest wall factors" as we discussed;  She has additional issues from obesity- r/o OSA, and from her Cardiac disease- hx congenital HD w/ surg, HBP, cardiomyopathy w/ combined sys/diast CHF- followed & treated by Cards, DrHarding... We discussed the nature of her dyspnea and the need for additional testing and a trial of KLONOPIN 0.5mg  Bid... We will sched FullPFTs and a Sleep Study and have her return after that...   ~  August 26, 2014:  2wk ROV & Sirinity returns having completed her Full PFTs and Sleep study> she has in addition been on the Klonopin 0.5mg  Bid but notes no improvement in her breathing on this dose (tol well just no better)...  Full PFTs 08/25/14 showed FVC=0.87 (26%), FEV1=0.77(27%), %1sec=88, mid-flows reduced at 28% predicted; after bronchodil- the FEV1 improved 6%; Lung volumes reduced w/ TLC=1.88 (42%), RV=0.85 (66%), RV/TLC=45 (some air trapping); DLCO=25% predicted, but corrects to normal (102%) when alveolar ventilation is taken into account...  Sleep Polysomnogram 08/18/14> Mod OSA w/ AHI=20/hr and REM AHI=54, mod snoring, desaturation to 64%...  IMP/PLANS>>  PFTs severely restricted (?effort/ accuracy) & seems to be mostly related to obesity/ chest wall and not to underlying lung parenchymal factors (CXR clear, exam clear, we will consider hi-res CTChest later if needed); In this regard we will increase the KLONOPIN to 1mg Bid before giving up on this med; she understands how important wt reduction is to her overall improvement- may want to  consider eating disorder counseling again... Her Sleep study reveals mod OSA & nocturnal hypoxemia- I discussed w/ DrClance & we decided on CPAP set up> RESMED S10 machine- air/auto w/ heated humidity & climate controlled tubing, set on AUTO 5-15, enroll in AirView, mask interface of choice per DME company & pt preference... We will plan ROV recheck in 6 weeks to review compliance & efficacy, then consider doing an ONO...    ~  October 14, 2014:  6wk ROV & Ryah has lost 2# in the interval but not able to exercise- walks some, gets winded, stairs are difficult but able to negotiate one flight; we reviewed how critically important wt reduction is for her;  She still appears sl hoarse to me, scratchy voice which she believes is due to "allergy issues" => rec ENT evaluation for completeness;  She increased the Klonopin to 1mg Bid & this is helping some she says- not as SOB at rest, able to get a deeper breath she thinks, etc;  Using CPAP satis w/ good compliance & download data showing AHI<1/hr on CPAP; she has nasal pillows but wants full face mask & we will  let [employer] know... We reviewed the following medical problems>>     DYSPNEA- multifactorial> ?asthma is a small component w/ restrictive dis due to obesity & scoliosis as a major factor, and CHF, deconditioning, anxiety as additional factors...    OSA>  Mod OSA on sleep study 4/16 w/ AHI=20/hr and REM AHI=54, mod snoring, desaturation to 64%; started on CPAP via [employer], auto 5-15 w/ good compliance => AHI <1/hr on the CPAP autoset 5-15, she wants to change nasal pillows to full face mask, we will check w/ [employer], then do ONO...    Hx asthma> on Symbicort160- 2sp daily, VentolinHFA rescue prn- using 2-3x/wk; PFT 4/16 w/ severe restriction, + small airways dis & min response to bronchodil; asked to continue inhalers for now...    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    Hx HBP> on MetopER25, Apres25- 1.5 tabsTid, Lasix40- 1.5tabsQam, Zaroxyln2.5 prn swelling- taking it almost every day; K=4.2 5/16 at [facility]; Note> allergic to ACE w/ angioedema of lips in past; BP= 130/88 today w/o CP etc...    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    Eating disorder/ Morbid Obesity> unusual hx of eating disorder w/ ANOREXIA as teen=> OBESITY w/ wt=201#, 5' Tall, BMI=39-40; she notes hx anorexia/ depression when in high school w/ lowest wt=80 lbs; psyche rx & counseling resulted in wt gain & she was ~150 lbs in college; gained to 180# after college & lost wt to 165 prior to her marriage at age 27 (she felt better when wt was down); now she is 34 y/o & has gained to max 203#...    Hypothyroidism> on Synthroid50; Labs 12/15 showed TSH=2.62, FreeT4=0.87    Hx IBS & s/p GB in 2013> aware...    Scoliosis & back pain> she was prev evaluated at [facility] by a back specialist, she does not recall the details, no records available, she notes legs get tired, she is not falling, notes that her hands shake on occas... 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    VitB12 defic> Labs showed B12 blood level 179-248 and she is rec to take B12 orally Qd w/ recheck levels...     Anxiety/ depression> on Klonopin 1mg  Bid at present... EXAM reveals Afeb, VSS,  O2sat=94% on RA at rest; HEENT- neg, mallampati 2, voice sl hoarse;  Chest- decr BS bilat, clear, decr diaph excursions by percussion, occas sign breaths;  Heart- RRR w/p m/r/g;  Ext- no c/c/e.  LABS 5/16 at [facility] showed Chems- wnl w/ Cr=1.04, BS=83, A1c=5.8;  FLP- at goals on diet alone...  [employer] AirView download> 5/11 - 10/13/14 showed good compliance w/ AHI <1/hr on the CPAP autoset 5-15... PLAN>>  She has a very complex medical situation as outlined above, and we are hampered by lack of old records (I have asked  her to help in getting this data);  For now she will continue the Symbicort, Ventolin, Klonopin, & CPAP; we will have [employer] look into full face mask per her preference; then proceed w/ ONO;  She knows the importance of wt reduction & may want to consider counseling from Dr. Stephania Fragmin for eating disorder;  Continue cardiac meds and f/u w/ DrHarding;  She is only 34 y/o w/ signif pulm restriction & heart disease;  We will XRay her spine in anticipation of getting old records from [place] & poss referral if this is playing a signif roll as well...   ~  December 14, 2014:  48mo ROV & Ariena returns for follow up> stable on her current regimen; she has multifactorial dyspnea>     OSA>  Mod OSA on sleep study 4/16 w/ AHI=20/hr and REM AHI=54, mod snoring, desaturation to 64%; started on CPAP via [employer], auto 5-15 w/ good compliance => AHI <1/hr on the CPAP autoset 5-15, she changed nasal pillows to full face mask, we will check download on FM, then do ONO...    Hx asthma> on Symbicort160- 2sp daily, VentolinHFA rescue prn- using 2-3x/wk; PFT 4/16 w/ severe restriction, + small airways dis & min response to bronchodil; asked to continue inhalers for now...    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    Hx HBP> on MetopER25, Apres25- 1.5 tabsTid, Lasix40- 1.5tabsQam, Zaroxyln2.5 prn  swelling- taking it almost every day; K=4.2 5/16 at [facility]; Note> allergic to ACE w/ angioedema of lips in past; BP= 130/88 today w/o CP etc...    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    Eating disorder/ Morbid Obesity> unusual hx of eating disorder w/ ANOREXIA as teen=> OBESITY w/ wt=204#, 5' Tall, BMI=39-40; she notes hx anorexia/ depression when in high school w/ lowest wt=80 lbs; psyche rx & counseling resulted in wt gain & she was ~150 lbs in college; gained to 180# after college & lost wt to 165 prior to her marriage at age 3 (she felt better when wt was down); now she is 34 y/o & has gained to max 204#... EXAM reveals Afeb, VSS, O2sat=95% on RA at rest; HEENT- neg, mallampati 2, voice sl hoarse;  Chest- decr BS bilat, clear, decr diaph excursions by percussion, occas sign breaths;  Heart- RRR w/p m/r/g;  Ext- no c/c/e. PLAN>>  Porcsha will soon start cardiac rehab w/ it's exercise & nutritional counseling- it is imperitive that she get the weight down!!!  We will check CPAP download on full face mask & then ONO on the CPAP set up to be sure that she is not desaturating... ROV planned 3 months...   ADDENDUM>>  CPAP download 8/10 - 01/13/15 on autoset 5-15 and now w/ FULL FaceMask showed 90% compliance, ave 5-6H per night, AHI=0.6.Marland KitchenMarland Kitchen Continue same.  ADDENDUM>>  ONO done 01/13/15 on CPAP on RA showed O2sat,88% for 25.11min of the 7H study; lowest O2sat=63%; oxygen desat index=51 desat events per hr;  she qualifies for O2 bleed-in at 2L/min for her CPAP  unit...    Past Medical History  Diagnosis Date    DYSPNEA> restrictive lung dis, morbid obesity... OSA> Sleep study 08/2014 w/ AHI=20, worse in REM, desat to 64%... Hx anorexia => morbid obesity...    . Chronic diastolic congestive heart failure, NYHA class 2 07/2011    Echo: EF was 25-30%; No Sig CAD on CATH; No significant finding on Cardiac MRI; follow-up Ech0 2014 --  EF of 50%  . Asthma   . Thyroid disease  >>  On Synthroid50, TSH 12/15 = 2.62   . Depression   . Anxiety   . Essential hypertension   . Stomach problems   . Hypertension   . 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 Cardiomyopathy     Unclear etiology. Last EF by echo 40-45% with global hypokinesis, mild LVH possible apical variant hypertrophic cardiomyopathy        B12 Deficiency >> on oral B12 supplement 1037mcg/d;  Labs showed B12 level 178 => 248    Past Surgical History  Procedure Laterality Date  . Cholecystectomy  2013  . Cardiac catheterization  08/2011    Normal Coronaries - LVEDP 32 mmHg  . Transthoracic echocardiogram  07/2011    Moderate concentric LVH, mildly dilated. EF 25-30% no diastolic dysfunction parameters to suggest elevated LAP  . Cardiac mri  April 2013    No suggestion of a congenital abnormality; EF ~ 52% - no regional wall motion abnormalities., no increased myocardial signal intensity. No perfusion abnormality.  . Transthoracic echocardiogram  December 2015    Last EF by echo 40-45% with global hypokinesis, mild LVH possible apical variant hypertrophic cardiomyopathy     Outpatient Encounter Prescriptions as of 12/14/2014  Medication Sig  . albuterol (PROVENTIL HFA;VENTOLIN HFA) 108 (90 BASE) MCG/ACT inhaler Inhale 2 puffs into the lungs as needed for wheezing or shortness of breath.  . budesonide-formoterol (SYMBICORT) 160-4.5  MCG/ACT inhaler Inhale 2 puffs into the lungs daily.  . cholestyramine light (PREVALITE) 4 GM/DOSE powder Take 4 g by mouth as needed (DIARRHEA DUE TO BILE ACIDS).   . clobetasol cream (TEMOVATE) 0.05 % Apply 1 application topically as needed (for Psoriasis).  . clonazePAM (KLONOPIN) 1 MG tablet Take 1 tablet (1 mg total) by mouth 2 (two) times daily.  [name] dicyclomine (BENTYL) 20 MG tablet Take 1 tablet (20 mg total) by mouth as needed for spasms.  . hydrALAZINE (APRESOLINE) 25 MG tablet Take 25 mg by mouth 3 (three) times daily.  [name] levothyroxine (SYNTHROID, LEVOTHROID) 75 MCG tablet Take 1 tablet (75 mcg total) by mouth daily.  . metolazone (ZAROXOLYN) 2.5 MG tablet Take 2.5 mg by mouth as needed (swelling).   . metoprolol succinate (TOPROL XL) 25 MG 24 hr tablet Take 1 tablet (25 mg total) by mouth daily.  . norethindrone-ethinyl estradiol 1/35 (ALAYCEN 1/35) tablet Take 1 tablet by mouth daily.  [name] omeprazole (PRILOSEC) 40 MG capsule Take 40 mg by mouth daily.  . potassium chloride SA (K-DUR,KLOR-CON) 20 MEQ tablet Take 40 mEq by mouth 2 (two) times daily.  [name] torsemide (DEMADEX) 20 MG tablet Take 1 tablet (20 mg total) by mouth daily.  . vitamin B-12 (CYANOCOBALAMIN) 1000 MCG tablet Take 1 tablet (1,000 mcg total) by mouth daily.   No facility-administered  encounter medications on file as of 12/14/2014.    Allergies  Allergen Reactions  . Lisinopril Swelling    Angioedema facial    Immunization History  Administered Date(s) Administered  . Influenza-Unspecified 01/15/2014    Current Medications, Allergies, Past Medical History, Past Surgical History, Family History, and Social History were reviewed in [employer] record.   Review of Systems             All symptoms NEG except where BOLDED >>  Constitutional:  Denies F/C/S, anorexia, unexpected weight change. HEENT:  No HA, visual changes, earache, nasal symptoms, sore throat, hoarseness. Resp:  cough, sputum,  hemoptysis; SOB, tightness, wheezing. Cardio:  CP, palpit, DOE, orthopnea, edema. GI:  Denies N/V/D/C or blood in stool; no reflux, abd pain, distention, or gas. GU:  No dysuria, freq, urgency, hematuria, or flank pain. MS:  Denies joint pain, swelling, tenderness, or decr ROM; no neck pain, back pain, etc. Neuro:  No tremors, seizures, dizziness, syncope, weakness, numbness, gait abn. Skin:  No suspicious lesions or skin rash. Heme:  No adenopathy, bruising, bleeding. Psyche: Denies confusion, sleep disturbance, hallucinations, anxiety, depression.   Objective:   Physical Exam    Vital Signs:  Reviewed...  General:  WD, overweight, 34 y/o WF in NAD; alert & oriented; pleasant & cooperative... HEENT:  [place]/AT; Conjunctiva- pink, Sclera- nonicteric, EOM-wnl, PERRLA, EACs-clear, TMs-wnl; NOSE-clear; THROAT-clear & wnl. Neck:  Supple w/ fair ROM; no JVD; normal carotid impulses w/o bruits; no thyromegaly or nodules palpated; no lymphadenopathy. Chest:  Clear to P & A; without wheezes, rales, or rhonchi heard (decr BS at bases) Heart:  Regular Rhythm; gr1/6 SEM, without rubs or gallops detected. Abdomen:  Obese, soft, nontender- no guarding or rebound; normal bowel sounds; no organomegaly or masses palpated. Ext:  Normal ROM; without deformities or arthritic changes; no varicose veins, +venous insuffic, tr edema;  Pulses intact w/o bruits. Neuro:  No focal neuro deficits, gait normal & balance OK. Derm:  No lesions noted; no rash etc. Lymph:  No cervical, supraclavicular, axillary, or inguinal adenopathy palpated.   Assessment:      DYSPNEA>> multifactorial Restrictive lung dis- felt to be due to obesity & chest wall factors and not parenchymal pulm dis => continue Klonopin 1mg  Bid... OSA w/ mod AHI=20/hr (worse w/ REM & desat to 64%) on sleep study 08/2014 => now on CPAP Auto 5-15 w/ good compliance and AHI<1, we will proceed w/ ONO check... Hx eating disorder w/ anorexia => morbid obesity  and she may benefit from psyche counseling again... Hx congenital HD w/ surg 1981, HBP, cardiomyopathy w/ combined sys/diast CHF- followed & treated by Cards, DrHarding...   PLAN>>  She has a very complex medical situation as outlined above, and we are hampered by lack of old records (I have asked her to help in getting this data);  For now she will continue the Symbicort, Ventolin, Klonopin, & CPAP; She knows the importance of wt reduction & may want to consider counseling from Dr. Stephania Fragmin for eating disorder;  Continue cardiac meds and f/u w/ DrHarding;  She is only 34 y/o w/ signif pulm restriction & heart disease;  We will XRay her spine in anticipation of getting old records from [place] & poss referral if this is playing a signif roll as well...     Plan:     Patient's Medications  New Prescriptions   No medications on file  Previous Medications   ALBUTEROL (PROVENTIL HFA;VENTOLIN HFA) 108 (90 BASE)  MCG/ACT INHALER    Inhale 2 puffs into the lungs as needed for wheezing or shortness of breath.   BUDESONIDE-FORMOTEROL (SYMBICORT) 160-4.5 MCG/ACT INHALER    Inhale 2 puffs into the lungs daily.   CHOLESTYRAMINE LIGHT (PREVALITE) 4 GM/DOSE POWDER    Take 4 g by mouth as needed (DIARRHEA DUE TO BILE ACIDS).    CLOBETASOL CREAM (TEMOVATE) 0.05 %    Apply 1 application topically as needed (for Sclerosis).    CLONAZEPAM (KLONOPIN) 1 MG TABLET    Take 1 tablet (1 mg total) by mouth 2 (two) times daily.   DICYCLOMINE (BENTYL) 20 MG TABLET    Take 1 tablet (20 mg total) by mouth as needed for spasms.   HYDRALAZINE (APRESOLINE) 25 MG TABLET    Take 25 mg by mouth 3 (three) times daily.   LEVOTHYROXINE (SYNTHROID, LEVOTHROID) 75 MCG TABLET    Take 1 tablet (75 mcg total) by mouth daily.   METOLAZONE (ZAROXOLYN) 2.5 MG TABLET    Take 2.5 mg by mouth as needed (swelling).    METOPROLOL SUCCINATE (TOPROL XL) 25 MG 24 HR TABLET    Take 1 tablet (25 mg total) by mouth daily.   NORETHINDRONE-ETHINYL ESTRADIOL  1/35 (ALAYCEN 1/35) TABLET    Take 1 tablet by mouth daily.   OMEPRAZOLE (PRILOSEC) 40 MG CAPSULE    Take 40 mg by mouth daily. Pt takes BID   POTASSIUM CHLORIDE SA (K-DUR,KLOR-CON) 20 MEQ TABLET    Take 40 mEq by mouth 2 (two) times daily.   TORSEMIDE (DEMADEX) 20 MG TABLET    Take 1 tablet (20 mg total) by mouth daily.   VITAMIN B-12 (CYANOCOBALAMIN) 1000 MCG TABLET    Take 1 tablet (1,000 mcg total) by mouth daily.  Modified Medications   Modified Medication Previous Medication   FUROSEMIDE (LASIX) 40 MG TABLET furosemide (LASIX) 40 MG tablet      TAKE 1 TABLET (40 MG TOTAL) BY MOUTH DAILY.    Take 1 tablet (40 mg total) by mouth daily.  Discontinued Medications   No medications on file

## 2014-12-14 NOTE — Patient Instructions (Signed)
Today we updated your med list in our EPIC system...    Continue your current medications the same...  We will check a recent CPAP download on the full facemask apparatus...    And we will sched an overnight oximetry w/ you on the CPAP ( to be sure your oxygen saturations are all ok at night)...  Call for any questions...  I think the cardiac rehab is a great idea!  Call for any questions...  Let's plan a follow up visit in 47mo, sooner if needed for problems.Marland KitchenMarland Kitchen

## 2014-12-15 ENCOUNTER — Other Ambulatory Visit: Payer: Self-pay | Admitting: Internal Medicine

## 2014-12-16 ENCOUNTER — Other Ambulatory Visit (INDEPENDENT_AMBULATORY_CARE_PROVIDER_SITE_OTHER): Payer: Commercial Managed Care - PPO | Admitting: *Deleted

## 2014-12-16 ENCOUNTER — Encounter (HOSPITAL_COMMUNITY)
Admission: RE | Admit: 2014-12-16 | Discharge: 2014-12-16 | Disposition: A | Discharge status: Home / Self Care | Payer: Commercial Managed Care - PPO | Source: Ambulatory Visit | Attending: Cardiology | Admitting: Cardiology

## 2014-12-16 DIAGNOSIS — E876 Hypokalemia: Secondary | ICD-10-CM

## 2014-12-16 DIAGNOSIS — I509 Heart failure, unspecified: Secondary | ICD-10-CM | POA: Insufficient documentation

## 2014-12-16 LAB — BASIC METABOLIC PANEL
BUN: 22 mg/dL (ref 6–23)
CHLORIDE: 104 meq/L (ref 96–112)
CO2: 27 mEq/L (ref 19–32)
Calcium: 8.8 mg/dL (ref 8.4–10.5)
Creatinine, Ser: 0.98 mg/dL (ref 0.40–1.20)
GFR: 68.7 mL/min (ref >60.00)
Glucose, Bld: 110 mg/dL — ABNORMAL HIGH (ref 70–99)
POTASSIUM: 4.2 meq/L (ref 3.5–5.1)
Sodium: 140 mEq/L (ref 135–145)

## 2014-12-16 NOTE — Progress Notes (Signed)
Cardiac Rehab Medication Review by a Pharmacist  Does the patient  feel that his/her medications are working for him/her?  Yes, but pt recently changed from Lasix to torsemide due to lack of benefit.  Has the patient been experiencing any side effects to the medications prescribed?  Yes, some dehydration with torsemide, physician recently reduced dose.  Does the patient measure his/her own blood pressure or blood glucose at home?  no   Does the patient have any problems obtaining medications due to transportation or finances?   no  Understanding of regimen: excellent Understanding of indications: excellent Potential of compliance: good  Pharmacist comments: Pt has a great understanding of her medication regimen and indications. She is wondering if her medications will be long term, I referred her back to her cardiologist for this question. She is hoping to simplify her regimen a bit so she doesn't have to take so many medications, but otherwise does not feel like they are causing any problems. I recommended she get a BP monitor to check her BP at home, especially while the physician is making changes to her regimen.  Arcola Jansky, PharmD Clinical Pharmacy Resident Pager: [phone]  12/16/2014 8:48 AM

## 2014-12-20 ENCOUNTER — Encounter (HOSPITAL_COMMUNITY): Payer: Commercial Managed Care - PPO

## 2014-12-22 ENCOUNTER — Encounter (HOSPITAL_COMMUNITY): Payer: Commercial Managed Care - PPO

## 2014-12-24 ENCOUNTER — Encounter (HOSPITAL_COMMUNITY): Payer: Commercial Managed Care - PPO

## 2014-12-27 ENCOUNTER — Encounter (HOSPITAL_COMMUNITY): Payer: Commercial Managed Care - PPO

## 2014-12-27 ENCOUNTER — Encounter (HOSPITAL_COMMUNITY)
Admission: RE | Admit: 2014-12-27 | Discharge: 2014-12-27 | Disposition: A | Discharge status: Home / Self Care | Payer: Commercial Managed Care - PPO | Source: Ambulatory Visit | Attending: Cardiology | Admitting: Cardiology

## 2014-12-27 DIAGNOSIS — I509 Heart failure, unspecified: Secondary | ICD-10-CM | POA: Diagnosis present

## 2014-12-27 NOTE — Progress Notes (Signed)
Pt started cardiac rehab today after vacation to the beach last week.  Pt tolerated light exercise with some difficulty due to short stature and deconditioning.  Pt with increased shortness of breath and found exercise to be difficult. Pt teary eyed at points as she was unable to exercise on the stationary bike for fear of falling off.  Pt switched to recumbent bike but was unable to tolerate this due shortness of breath.  Pt weight elevated from orientation 3.2 kg.  Pt lungs clear but pt complains of feeling "puffy and bloated"  Pt did not take her demadex while on vacation.  Pt has prn to increase her demadex to 40mg  when she has weight gain.  Pt did not take her demadex today prior to exercise due to frequent urination.  Pt given parameters on increase diuretic today and tomorrow and to monitor her weight accordingly.  VSS, telemetry- SR, ST.  Medication list reconciled.  Pt verbalized compliance with medications and denies barriers to compliance. PSYCHOSOCIAL ASSESSMENT:  PHQ-5.   Pt exhibits positive coping skills, hopeful outlook with supportive family but is anxious regarding her young age to have these issues and uncertainty of the future.  Pt denies any psychosocial needs identified at this time, no psychosocial interventions necessary.  However will periodically check in with pt for ongoing assessment and intervention as needed.  Pt enjoys reading, walking her dog and playing in the water.   Pt cardiac rehab  goal is  to build up tolerance, feel more confidant and to lose 6-7 pounds.  Pt encouraged to participate in nutrititionclasses and home exercise to increase ability to achieve these goals.   Pt long term cardiac rehab goal is lose 50 pounds over a year. Will monitor pt progress toward meeting this goal with daily weights on each exercise session.  Pt oriented to exercise equipment and routine.  Understanding verbalized. Alanson Aly, BSN

## 2014-12-29 ENCOUNTER — Encounter (HOSPITAL_COMMUNITY): Payer: Commercial Managed Care - PPO

## 2014-12-29 ENCOUNTER — Encounter (HOSPITAL_COMMUNITY)
Admission: RE | Admit: 2014-12-29 | Discharge: 2014-12-29 | Disposition: A | Discharge status: Home / Self Care | Payer: Commercial Managed Care - PPO | Source: Ambulatory Visit | Attending: Cardiology | Admitting: Cardiology

## 2014-12-29 DIAGNOSIS — I509 Heart failure, unspecified: Secondary | ICD-10-CM | POA: Diagnosis not present

## 2014-12-31 ENCOUNTER — Encounter (HOSPITAL_COMMUNITY): Payer: Commercial Managed Care - PPO

## 2014-12-31 ENCOUNTER — Encounter (HOSPITAL_COMMUNITY)
Admission: RE | Admit: 2014-12-31 | Discharge: 2014-12-31 | Disposition: A | Discharge status: Home / Self Care | Payer: Commercial Managed Care - PPO | Source: Ambulatory Visit | Attending: Cardiology | Admitting: Cardiology

## 2014-12-31 DIAGNOSIS — I509 Heart failure, unspecified: Secondary | ICD-10-CM | POA: Diagnosis not present

## 2015-01-03 ENCOUNTER — Encounter (HOSPITAL_COMMUNITY)
Admission: RE | Admit: 2015-01-03 | Discharge: 2015-01-03 | Disposition: A | Discharge status: Home / Self Care | Payer: Commercial Managed Care - PPO | Source: Ambulatory Visit | Attending: Cardiology | Admitting: Cardiology

## 2015-01-03 ENCOUNTER — Encounter (HOSPITAL_COMMUNITY): Payer: Commercial Managed Care - PPO

## 2015-01-03 DIAGNOSIS — I509 Heart failure, unspecified: Secondary | ICD-10-CM | POA: Diagnosis not present

## 2015-01-05 ENCOUNTER — Observation Stay (HOSPITAL_COMMUNITY)
Admission: EM | Admit: 2015-01-05 | Discharge: 2015-01-06 | Disposition: A | Discharge status: Home / Self Care | Payer: Commercial Managed Care - PPO | Attending: Internal Medicine | Admitting: Internal Medicine

## 2015-01-05 ENCOUNTER — Emergency Department (HOSPITAL_COMMUNITY): Payer: Commercial Managed Care - PPO

## 2015-01-05 ENCOUNTER — Other Ambulatory Visit: Payer: Self-pay

## 2015-01-05 ENCOUNTER — Encounter (HOSPITAL_COMMUNITY)
Admission: RE | Admit: 2015-01-05 | Discharge: 2015-01-05 | Disposition: A | Discharge status: Home / Self Care | Payer: Commercial Managed Care - PPO | Source: Ambulatory Visit | Attending: Cardiology | Admitting: Cardiology

## 2015-01-05 ENCOUNTER — Encounter (HOSPITAL_COMMUNITY): Payer: Self-pay | Admitting: *Deleted

## 2015-01-05 ENCOUNTER — Encounter (HOSPITAL_COMMUNITY): Payer: Commercial Managed Care - PPO

## 2015-01-05 DIAGNOSIS — Q249 Congenital malformation of heart, unspecified: Secondary | ICD-10-CM | POA: Insufficient documentation

## 2015-01-05 DIAGNOSIS — M549 Dorsalgia, unspecified: Secondary | ICD-10-CM | POA: Diagnosis not present

## 2015-01-05 DIAGNOSIS — I10 Essential (primary) hypertension: Secondary | ICD-10-CM | POA: Insufficient documentation

## 2015-01-05 DIAGNOSIS — Z7951 Long term (current) use of inhaled steroids: Secondary | ICD-10-CM | POA: Insufficient documentation

## 2015-01-05 DIAGNOSIS — R000 Tachycardia, unspecified: Secondary | ICD-10-CM | POA: Insufficient documentation

## 2015-01-05 DIAGNOSIS — J45909 Unspecified asthma, uncomplicated: Secondary | ICD-10-CM | POA: Diagnosis present

## 2015-01-05 DIAGNOSIS — E079 Disorder of thyroid, unspecified: Secondary | ICD-10-CM | POA: Diagnosis not present

## 2015-01-05 DIAGNOSIS — Z79899 Other long term (current) drug therapy: Secondary | ICD-10-CM | POA: Diagnosis not present

## 2015-01-05 DIAGNOSIS — I5042 Chronic combined systolic (congestive) and diastolic (congestive) heart failure: Secondary | ICD-10-CM | POA: Insufficient documentation

## 2015-01-05 DIAGNOSIS — I429 Cardiomyopathy, unspecified: Secondary | ICD-10-CM | POA: Insufficient documentation

## 2015-01-05 DIAGNOSIS — R0602 Shortness of breath: Secondary | ICD-10-CM

## 2015-01-05 DIAGNOSIS — R079 Chest pain, unspecified: Secondary | ICD-10-CM | POA: Diagnosis not present

## 2015-01-05 DIAGNOSIS — R112 Nausea with vomiting, unspecified: Secondary | ICD-10-CM | POA: Diagnosis not present

## 2015-01-05 DIAGNOSIS — J45901 Unspecified asthma with (acute) exacerbation: Secondary | ICD-10-CM | POA: Insufficient documentation

## 2015-01-05 DIAGNOSIS — R42 Dizziness and giddiness: Secondary | ICD-10-CM | POA: Diagnosis not present

## 2015-01-05 DIAGNOSIS — Z9989 Dependence on other enabling machines and devices: Secondary | ICD-10-CM

## 2015-01-05 DIAGNOSIS — G4733 Obstructive sleep apnea (adult) (pediatric): Secondary | ICD-10-CM | POA: Diagnosis present

## 2015-01-05 DIAGNOSIS — I509 Heart failure, unspecified: Secondary | ICD-10-CM | POA: Diagnosis not present

## 2015-01-05 DIAGNOSIS — K319 Disease of stomach and duodenum, unspecified: Secondary | ICD-10-CM | POA: Diagnosis not present

## 2015-01-05 DIAGNOSIS — R61 Generalized hyperhidrosis: Secondary | ICD-10-CM | POA: Diagnosis not present

## 2015-01-05 LAB — I-STAT TROPONIN, ED
TROPONIN I, POC: 0 ng/mL (ref 0.00–0.08)
TROPONIN I, POC: 0.01 ng/mL (ref 0.00–0.08)

## 2015-01-05 LAB — BASIC METABOLIC PANEL
ANION GAP: 13 (ref 5–15)
BUN: 13 mg/dL (ref 6–20)
CALCIUM: 9 mg/dL (ref 8.9–10.3)
CHLORIDE: 93 mmol/L — AB (ref 101–111)
CO2: 31 mmol/L (ref 22–32)
Creatinine, Ser: 1.19 mg/dL — ABNORMAL HIGH (ref 0.44–1.00)
GFR calc Af Amer: >60 mL/min (ref >60)
GFR calc non Af Amer: 59 mL/min — ABNORMAL LOW (ref >60)
GLUCOSE: 129 mg/dL — AB (ref 65–99)
Potassium: 3 mmol/L — ABNORMAL LOW (ref 3.5–5.1)
Sodium: 137 mmol/L (ref 135–145)

## 2015-01-05 LAB — HEPATIC FUNCTION PANEL
ALK PHOS: 75 U/L (ref 38–126)
ALT: 39 U/L (ref 14–54)
AST: 31 U/L (ref 15–41)
Albumin: 3.3 g/dL — ABNORMAL LOW (ref 3.5–5.0)
BILIRUBIN TOTAL: 0.6 mg/dL (ref 0.3–1.2)
Total Protein: 7.8 g/dL (ref 6.5–8.1)

## 2015-01-05 LAB — CBC
HCT: 44.5 % (ref 36.0–46.0)
HEMOGLOBIN: 15.1 g/dL — AB (ref 12.0–15.0)
MCH: 30.9 pg (ref 26.0–34.0)
MCHC: 33.9 g/dL (ref 30.0–36.0)
MCV: 91 fL (ref 78.0–100.0)
Platelets: 419 10*3/uL — ABNORMAL HIGH (ref 150–400)
RBC: 4.89 MIL/uL (ref 3.87–5.11)
RDW: 13.3 % (ref 11.5–15.5)
WBC: 13.2 10*3/uL — ABNORMAL HIGH (ref 4.0–10.5)

## 2015-01-05 LAB — LIPASE, BLOOD: LIPASE: 18 U/L — AB (ref 22–51)

## 2015-01-05 LAB — TROPONIN I: Troponin I: <0.03 ng/mL (ref <0.031)

## 2015-01-05 LAB — MAGNESIUM: Magnesium: 1.8 mg/dL (ref 1.7–2.4)

## 2015-01-05 LAB — BRAIN NATRIURETIC PEPTIDE: B NATRIURETIC PEPTIDE 5: 18.8 pg/mL (ref 0.0–100.0)

## 2015-01-05 MED ORDER — NITROGLYCERIN 0.4 MG SL SUBL
0.4000 mg | SUBLINGUAL_TABLET | SUBLINGUAL | Status: DC | PRN
  Administered 2015-01-05: 0.4 mg via SUBLINGUAL
  Filled 2015-01-05: qty 1

## 2015-01-05 MED ORDER — CALCIUM CARBONATE ANTACID 500 MG PO CHEW: 1.0000 | CHEWABLE_TABLET | Freq: Every day | ORAL | Status: DC | PRN

## 2015-01-05 MED ORDER — HYDRALAZINE HCL 25 MG PO TABS
25.0000 mg | ORAL_TABLET | Freq: Three times a day (TID) | ORAL | Status: DC
  Administered 2015-01-05 – 2015-01-06 (×2): 25 mg via ORAL
  Filled 2015-01-05 (×2): qty 1

## 2015-01-05 MED ORDER — MORPHINE SULFATE (PF) 2 MG/ML IV SOLN
2.0000 mg | INTRAVENOUS | Status: DC | PRN
  Administered 2015-01-05 – 2015-01-06 (×2): 2 mg via INTRAVENOUS
  Filled 2015-01-05 (×2): qty 1

## 2015-01-05 MED ORDER — ALBUTEROL SULFATE HFA 108 (90 BASE) MCG/ACT IN AERS: 2.0000 | INHALATION_SPRAY | RESPIRATORY_TRACT | Status: DC | PRN

## 2015-01-05 MED ORDER — ASPIRIN 325 MG PO TABS
325.0000 mg | ORAL_TABLET | Freq: Once | ORAL | Status: AC
  Administered 2015-01-05: 325 mg via ORAL
  Filled 2015-01-05: qty 1

## 2015-01-05 MED ORDER — MORPHINE SULFATE (PF) 4 MG/ML IV SOLN
4.0000 mg | Freq: Once | INTRAVENOUS | Status: AC
Start: 2015-01-05 — End: 2015-01-05
  Administered 2015-01-05: 4 mg via INTRAVENOUS
  Filled 2015-01-05: qty 1

## 2015-01-05 MED ORDER — GI COCKTAIL ~~LOC~~
30.0000 mL | Freq: Once | ORAL | Status: AC
  Administered 2015-01-05: 30 mL via ORAL
  Filled 2015-01-05: qty 30

## 2015-01-05 MED ORDER — ONDANSETRON HCL 4 MG/2ML IJ SOLN: 4.0000 mg | Freq: Four times a day (QID) | INTRAMUSCULAR | Status: DC | PRN

## 2015-01-05 MED ORDER — METOLAZONE 5 MG PO TABS: 2.5000 mg | ORAL_TABLET | ORAL | Status: DC | PRN

## 2015-01-05 MED ORDER — LEVOTHYROXINE SODIUM 75 MCG PO TABS
75.0000 ug | ORAL_TABLET | Freq: Every day | ORAL | Status: DC
  Administered 2015-01-06: 75 ug via ORAL
  Filled 2015-01-05: qty 1

## 2015-01-05 MED ORDER — TORSEMIDE 20 MG PO TABS
40.0000 mg | ORAL_TABLET | Freq: Every day | ORAL | Status: DC
  Administered 2015-01-06: 40 mg via ORAL
  Filled 2015-01-05: qty 2

## 2015-01-05 MED ORDER — BUDESONIDE-FORMOTEROL FUMARATE 160-4.5 MCG/ACT IN AERO
2.0000 | INHALATION_SPRAY | Freq: Every day | RESPIRATORY_TRACT | Status: DC
  Administered 2015-01-06: 2 via RESPIRATORY_TRACT
  Filled 2015-01-05: qty 6

## 2015-01-05 MED ORDER — PANTOPRAZOLE SODIUM 40 MG PO TBEC
40.0000 mg | DELAYED_RELEASE_TABLET | Freq: Every day | ORAL | Status: DC
  Administered 2015-01-06: 40 mg via ORAL
  Filled 2015-01-05: qty 1

## 2015-01-05 MED ORDER — ASPIRIN EC 325 MG PO TBEC
325.0000 mg | DELAYED_RELEASE_TABLET | Freq: Every day | ORAL | Status: DC
  Administered 2015-01-06: 325 mg via ORAL
  Filled 2015-01-05: qty 1

## 2015-01-05 MED ORDER — IOHEXOL 350 MG/ML SOLN
80.0000 mL | Freq: Once | INTRAVENOUS | Status: AC | PRN
  Administered 2015-01-05: 100 mL via INTRAVENOUS

## 2015-01-05 MED ORDER — METOPROLOL SUCCINATE ER 25 MG PO TB24
25.0000 mg | ORAL_TABLET | Freq: Every day | ORAL | Status: DC
  Administered 2015-01-06: 25 mg via ORAL
  Filled 2015-01-05: qty 1

## 2015-01-05 MED ORDER — POTASSIUM CHLORIDE CRYS ER 20 MEQ PO TBCR
40.0000 meq | EXTENDED_RELEASE_TABLET | Freq: Two times a day (BID) | ORAL | Status: DC
  Administered 2015-01-05: 40 meq via ORAL
  Filled 2015-01-05: qty 2

## 2015-01-05 MED ORDER — DICYCLOMINE HCL 20 MG PO TABS: 20.0000 mg | ORAL_TABLET | Freq: Every day | ORAL | Status: DC | PRN

## 2015-01-05 MED ORDER — CLOBETASOL PROPIONATE 0.05 % EX CREA: 1.0000 "application " | TOPICAL_CREAM | CUTANEOUS | Status: DC | PRN

## 2015-01-05 MED ORDER — POTASSIUM CHLORIDE CRYS ER 20 MEQ PO TBCR
60.0000 meq | EXTENDED_RELEASE_TABLET | Freq: Once | ORAL | Status: AC
  Administered 2015-01-05: 60 meq via ORAL
  Filled 2015-01-05: qty 3

## 2015-01-05 MED ORDER — CLONAZEPAM 1 MG PO TABS
1.0000 mg | ORAL_TABLET | Freq: Two times a day (BID) | ORAL | Status: DC
  Administered 2015-01-05 – 2015-01-06 (×2): 1 mg via ORAL
  Filled 2015-01-05 (×2): qty 1

## 2015-01-05 MED ORDER — ALBUTEROL SULFATE (2.5 MG/3ML) 0.083% IN NEBU: 2.5000 mg | INHALATION_SOLUTION | Freq: Four times a day (QID) | RESPIRATORY_TRACT | Status: DC | PRN

## 2015-01-05 MED ORDER — ENOXAPARIN SODIUM 40 MG/0.4ML ~~LOC~~ SOLN
40.0000 mg | SUBCUTANEOUS | Status: DC
Start: 2015-01-05 — End: 2015-01-06
  Administered 2015-01-05: 40 mg via SUBCUTANEOUS
  Filled 2015-01-05 (×2): qty 0.4

## 2015-01-05 MED ORDER — CHOLESTYRAMINE LIGHT 4 GM/DOSE PO POWD
4.0000 g | ORAL | Status: DC | PRN
  Filled 2015-01-05: qty 1

## 2015-01-05 MED ORDER — ACETAMINOPHEN 325 MG PO TABS
650.0000 mg | ORAL_TABLET | ORAL | Status: DC | PRN
  Administered 2015-01-06: 650 mg via ORAL
  Filled 2015-01-05: qty 2

## 2015-01-05 MED ORDER — VITAMIN B-12 1000 MCG PO TABS
1000.0000 ug | ORAL_TABLET | Freq: Every day | ORAL | Status: DC
  Administered 2015-01-06: 1000 ug via ORAL
  Filled 2015-01-05: qty 1

## 2015-01-05 NOTE — ED Notes (Signed)
Patient undressed, in gown, on monitor, continuous pulse oximetry and blood pressure cuff; visitor at bedside 

## 2015-01-05 NOTE — ED Notes (Signed)
Pt reports onset of chest pain and sob while sitting at her desk approx 30 minutes ago. Pain was constant. Pt reports hx of chf.

## 2015-01-05 NOTE — ED Provider Notes (Signed)
CSN: [id number]     Arrival date & time 01/05/15  1436 History   First MD Initiated Contact with Patient 01/05/15 1611     Chief Complaint  Patient presents with  . Chest Pain     (Consider location/radiation/quality/duration/timing/severity/associated sxs/prior Treatment) HPI Comments: Meredith Diaz is a 34 y.o. female with a PMHx of CHF (EF 40-45%), congenital heart disease s/p repair (unknown details), asthma, depression, anxiety, hypothyroidism, HTN, NICM, and morbid obesity, who presents to the ED with complaints of sudden onset chest pain and shortness of breath that occurred while at rest sitting at her desk around 2:15 PM. She reports she has similar episode on Sunday, but it resolved and she did not seek medical attention. She puts the pain today is 10/10 squeezing pressure-like constant substernal pain radiating to her back worse with breathing, with no treatments tried prior to arrival. Associated symptoms include lightheadedness, nausea, one episode of nonbloody nonbilious emesis, shortness of breath, and diaphoresis. She denies any fevers, chills, leg swelling, cough, hemoptysis, recent travel/surgery/immobilization, family or personal history of DVT/PE, orthopnea, abdominal pain, diarrhea, constipation, melena, hematochezia, hematemesis, dysuria, hematuria, numbness, tingling, weakness, syncope, jaw or neck pain. She is a nonsmoker. She is currently on her menstrual cycle. She has a significant family history of MI in her maternal grandfather, paternal grandparents both died of MI, and father his had a quintuple bypass. Her cardiologist is Dr. Herbie Baltimore. PCP: Judie Bonus, MD at [facility]  Patient is a 34 y.o. female presenting with chest pain. The history is provided by the patient. No language interpreter was used.  Chest Pain Pain location:  Substernal area Pain quality: pressure   Pain radiates to:  Mid back Pain radiates to the back: yes   Pain severity:  Severe Onset  quality:  Sudden Duration:  2 hours Timing:  Constant Progression:  Unchanged Chronicity:  Recurrent Context: at rest   Relieved by:  None tried Worsened by:  Deep breathing Ineffective treatments:  None tried Associated symptoms: back pain (from chest), diaphoresis, nausea, shortness of breath and vomiting (x1)   Associated symptoms: no abdominal pain, no cough, no fever, no heartburn, no lower extremity edema, no numbness, no orthopnea, no syncope and no weakness   Risk factors: birth control and hypertension   Risk factors: no coronary artery disease, no diabetes mellitus, no high cholesterol, no immobilization, no prior DVT/PE, no smoking and no surgery     Past Medical History  Diagnosis Date  . Chronic combined systolic and diastolic CHF (congestive heart failure) 07/2011    a. Prior Echo: EF was 25-30%; b. No Sig CAD on CATH; c. No significant finding on Cardiac MRI; d. follow-up Echo 2014 --  EF of 50%;  e. 03/2014 Echo: EF 40-45%, gr 1 DD; f. 11/2014 Echo: EF 40-45%, Gr 2 DD.  [name] Asthma   . Thyroid disease   . Depression   . Anxiety   . Stomach problems   . Essential hypertension   . Congenital heart disease, adult     a. 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  . Nonischemic cardiomyopathy     a. unclear etiology;  b. 01/2012 Cardiac MRI: no infiltrative pathology, EF 52%;  b. 11/2014 Echo: EF 40-45%, gr 2 DD.  . Morbid obesity    Past Surgical History  Procedure Laterality Date  . Cholecystectomy  2013  . Cardiac  catheterization  08/2011    Normal Coronaries - LVEDP 32 mmHg  . Transthoracic echocardiogram  07/2011    Moderate concentric LVH, mildly dilated. EF 25-30% no diastolic dysfunction parameters to suggest elevated LAP  . Cardiac mri  April 2013    No suggestion of a congenital abnormality; EF ~ 52% - no regional wall motion abnormalities., no  increased myocardial signal intensity. No perfusion abnormality.  . Transthoracic echocardiogram  December 2015    Last EF by echo 40-45% with global hypokinesis, mild LVH possible apical variant hypertrophic cardiomyopathy    Family History  Problem Relation Age of Onset  . Depression Mother   . Hypertension Father   . Heart disease Maternal Grandfather   . Heart disease Paternal Grandmother   . Breast cancer Paternal Grandmother   . Colon cancer Maternal Grandmother   . Asthma Paternal Grandmother   . Heart attack Maternal Grandfather   . Heart attack Paternal Grandmother   . Heart attack Paternal Grandfather   . Stroke Neg Hx    Social History  Substance Use Topics  . Smoking status: Never Smoker   . Smokeless tobacco: None  . Alcohol Use: No   OB History    No data available     Review of Systems  Constitutional: Positive for diaphoresis. Negative for fever and chills.  Respiratory: Positive for shortness of breath. Negative for cough and wheezing.   Cardiovascular: Positive for chest pain. Negative for orthopnea, leg swelling and syncope.  Gastrointestinal: Positive for nausea and vomiting (x1). Negative for heartburn, abdominal pain, diarrhea, constipation and blood in stool.  Genitourinary: Negative for dysuria and hematuria.  Musculoskeletal: Positive for back pain (from chest). Negative for myalgias, arthralgias and neck pain.  Skin: Negative for color change.  Allergic/Immunologic: Negative for immunocompromised state.  Neurological: Positive for light-headedness. Negative for syncope, weakness and numbness.  Psychiatric/Behavioral: Negative for confusion.   10 Systems reviewed and are negative for acute change except as noted in the HPI.    Allergies  Lisinopril  Home Medications   Prior to Admission medications   Medication Sig Start Date End Date Taking? Authorizing Provider  albuterol (PROVENTIL HFA;VENTOLIN HFA) 108 (90 BASE) MCG/ACT inhaler Inhale 2  puffs into the lungs as needed for wheezing or shortness of breath.    Historical Provider, MD  budesonide-formoterol (SYMBICORT) 160-4.5 MCG/ACT inhaler Inhale 2 puffs into the lungs daily. 01/28/14   Judie Bonus, MD  cholestyramine light (PREVALITE) 4 GM/DOSE powder Take 4 g by mouth as needed (DIARRHEA DUE TO BILE ACIDS).     Historical Provider, MD  clobetasol cream (TEMOVATE) 0.05 % Apply 1 application topically as needed (for Sclerosis).     Historical Provider, MD  clonazePAM (KLONOPIN) 1 MG tablet Take 1 tablet (1 mg total) by mouth 2 (two) times daily. Patient taking differently: Take 1 mg by mouth 2 (two) times daily. Pt takes 1/2 tablet in morning and 1 at night b/c it makes her groggy. MD said she could go back up to 1 tablet BID if she felt like her anxiety wasn't controlled 08/26/14   Michele Mcalpine, MD  dicyclomine (BENTYL) 20 MG tablet Take 1 tablet (20 mg total) by mouth as needed for spasms. Patient taking differently: Take 20 mg by mouth daily as needed for spasms.  01/28/14   Judie Bonus, MD  furosemide (LASIX) 40 MG tablet TAKE 1 TABLET (40 MG TOTAL) BY MOUTH DAILY. Patient not taking: Reported on 12/16/2014 12/15/14   Lanora Manis  A Kollar, MD  hydrALAZINE (APRESOLINE) 25 MG tablet Take 25 mg by mouth 3 (three) times daily.    Historical Provider, MD  levothyroxine (SYNTHROID, LEVOTHROID) 75 MCG tablet Take 1 tablet (75 mcg total) by mouth daily. 11/01/14   Judie Bonus, MD  metolazone (ZAROXOLYN) 2.5 MG tablet Take 2.5 mg by mouth as needed (swelling).     Historical Provider, MD  metoprolol succinate (TOPROL XL) 25 MG 24 hr tablet Take 1 tablet (25 mg total) by mouth daily. 03/08/14   Marykay Lex, MD  norethindrone-ethinyl estradiol 1/35 (ALAYCEN 1/35) tablet Take 1 tablet by mouth daily. 01/28/14   Judie Bonus, MD  omeprazole (PRILOSEC) 40 MG capsule Take 40 mg by mouth daily. Pt takes BID    Historical Provider, MD  potassium chloride SA (K-DUR,KLOR-CON)  20 MEQ tablet Take 40 mEq by mouth 2 (two) times daily.    Historical Provider, MD  torsemide (DEMADEX) 20 MG tablet Take 1 tablet (20 mg total) by mouth daily. 12/09/14   Ok Anis, NP  vitamin B-12 (CYANOCOBALAMIN) 1000 MCG tablet Take 1 tablet (1,000 mcg total) by mouth daily. 04/13/14   Judie Bonus, MD   BP 151/108 mmHg  Pulse 117  Temp(Src) 98 F (36.7 C) (Oral)  Resp 20  Ht 5' (1.524 m)  Wt 201 lb (91.173 kg)  BMI 39.26 kg/m2  SpO2 96%  LMP 01/05/2015 Physical Exam  Constitutional: She is oriented to person, place, and time. Vital signs are normal. She appears well-developed and well-nourished.  Non-toxic appearance. She appears distressed (anxious).  Afebrile, nontoxic, appears anxious  HENT:  Head: Normocephalic and atraumatic.  Mouth/Throat: Oropharynx is clear and moist and mucous membranes are normal.  Eyes: Conjunctivae and EOM are normal. Right eye exhibits no discharge. Left eye exhibits no discharge.  Neck: Normal range of motion. Neck supple.  Cardiovascular: Regular rhythm and normal heart sounds.  Tachycardia present.  Exam reveals no gallop and no friction rub.   No murmur heard. Pulses:      Dorsalis pedis pulses are 0 on the right side, and 2+ on the left side.  Tachycardic, reg rhythm, nl s1/s2, no m/r/g, distal pulses intact but difficult to palpate in R DP pulse (pt states this is chronic), confirmed by doppler tones, no pedal edema   Pulmonary/Chest: Effort normal and breath sounds normal. No respiratory distress. She has no decreased breath sounds. She has no wheezes. She has no rhonchi. She has no rales. She exhibits no tenderness, no crepitus, no deformity and no retraction.  CTAB in all lung fields, no w/r/r, no hypoxia or increased WOB, speaking in full sentences, SpO2 97% on RA No chest wall TTP, no crepitus or deformity, no retractions  Abdominal: Soft. Normal appearance and bowel sounds are normal. She exhibits no distension. There is no  tenderness. There is no rigidity, no rebound, no guarding, no CVA tenderness, no tenderness at McBurney's point and negative Murphy's sign.  Musculoskeletal: Normal range of motion.  MAE x4 Strength and sensation grossly intact No pedal edema, neg homan's bilaterally   Neurological: She is alert and oriented to person, place, and time. She has normal strength. No sensory deficit.  Skin: Skin is warm, dry and intact. No rash noted.  Psychiatric: She has a normal mood and affect.  Nursing note and vitals reviewed.   ED Course  Procedures (including critical care time) Labs Review Labs Reviewed  BASIC METABOLIC PANEL - Abnormal; Notable for the following:  Potassium 3.0 (*)    Chloride 93 (*)    Glucose, Bld 129 (*)    Creatinine, Ser 1.19 (*)    GFR calc non Af Amer 59 (*)    All other components within normal limits  CBC - Abnormal; Notable for the following:    WBC 13.2 (*)    Hemoglobin 15.1 (*)    Platelets 419 (*)    All other components within normal limits  MAGNESIUM  BRAIN NATRIURETIC PEPTIDE  I-STAT TROPOININ, ED  I-STAT TROPOININ, ED    Imaging Review Dg Chest 2 View  01/05/2015   CLINICAL DATA:  Chest pain and shortness of breath for 1 day.  EXAM: CHEST  2 VIEW  COMPARISON:  None.  FINDINGS: Cardiomediastinal silhouette is normal. Mediastinal contours are prominent.  There is no evidence of focal airspace consolidation, or pneumothorax. There is thickening of the left more than right medial pleura. There may be a loculated left pleural effusion, evident by elevation of the left hemidiaphragm.  Osseous structures are without acute abnormality. Mild rotational scoliosis of the thoracic spine is noted. Soft tissues are grossly normal.  IMPRESSION: Thickening of the left more than right pleura centrally, with possible loculated left pleural effusion.  Mild prominence of the mediastinal contours, which may be partially positional. If vascular pathology is suspected, CT of the  chest may be considered.   Electronically Signed   By: Ted Mcalpine M.D.   On: 01/05/2015 15:42   Ct Angio Chest Pe W/cm &/or Wo Cm  01/05/2015   CLINICAL DATA:  Chest pain shortness of breath since this 4 p.m. this afternoon.  EXAM: CT ANGIOGRAPHY CHEST WITH CONTRAST  TECHNIQUE: Multidetector CT imaging of the chest was performed using the standard protocol during bolus administration of intravenous contrast. Multiplanar CT image reconstructions and MIPs were obtained to evaluate the vascular anatomy.  CONTRAST:  OMNIPAQUE IOHEXOL 350 MG/ML SOLN  COMPARISON:  Chest x-ray today.  FINDINGS: Examination demonstrates prominent overlying soft tissues. Lungs are adequately inflated without lobar consolidation or effusion. Minimal linear atelectasis over the medial left base. Airways are within normal.  There is mild cardiomegaly. There is no evidence of pulmonary embolism. There is mild ectasia of the ascending thoracic aorta measuring 3.7 cm in AP diameter. Remaining mediastinal structures are within normal. There is no hilar, mediastinal or axillary adenopathy.  Images through the upper abdomen are within normal.  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 There is also fusion of several posterior ribs bilaterally.  Review of the MIP images confirms the above findings.  IMPRESSION: No acute cardiopulmonary disease. No evidence of pulmonary embolism.  Cardiomegaly.  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   Electronically Signed   By: Elberta Fortis M.D.   On: 01/05/2015 19:05   I have personally reviewed and evaluated these images and lab results as part of my medical decision-making.  Exercise stress test 12/2014: Stress Findings    ECG Sinus Tach, 120, 1-1.25mm flat ST dep in leads II, III, aVF, V3-V6. [name]   Stress Findings The patient exercised following the Bruce  protocol.  The patient reported shortness of breath and fatigue during the stress test. Leg fatigue. The patient experienced no angina during the stress test.   The test was stopped because the patient complained of fatigue and shortness of breath.   Blood pressure demonstrated a hypertensive response to  exercise. Heart rate demonstrated an exaggerated response to exercise. Overall, the patient's exercise capacity was moderately impaired.   Recovery time: 5 minutes. The patient's response to exercise was adequate for diagnosis.   Response to Stress No T wave inversion was noted during stress. Arrhythmias during stress: rare PVCs.  Arrhythmias during recovery: none.  Arrhythmias were not significant.  ECG was interpretable and non-conclusive.  Baseline flat ST depression that becomes upsloping with exercise. No worsening of baseline ST depression. Poor exercise tolerance. Hypertensive response to exercise. Study was performed to assess exercise tolerance and establish baseline for cardiopulmonary rehab, which she starts next week. No further ischemic eval at this time.    Stress Measurements    Baseline Vitals  Rest HR 120 bpm    Rest BP 122/87 mmHg    Exercise Time  Exercise duration (min) 3 min    Exercise duration (sec) 43 sec    Peak Stress Vitals  Peak HR 166 bpm    Peak BP 188/98 mmHg    Exercise Data  MPHR 186 bpm    Percent HR 89 %    RPE 18     Estimated workload 5.4 METS        11/18/14 Echo: Study Conclusions - Left ventricle: The cavity size was normal. Wall thickness was normal. Systolic function was mildly to moderately reduced. The estimated ejection fraction was in the range of 40% to 45%. Diffuse hypokinesis. Doppler parameters are consistent with pseudonormal left ventricular relaxation (grade 2 diastolic dysfunction). The E/e&' ratio is >15, suggesting elevated LV filling pressure. The E/A ratio is 1.5. - Left atrium: The atrium was  normal in size.  Impressions: - Compared to the prior study in 2015, there appears to be worsening diastolic function with elevated LV filling pressure. LVEF remains 40-45% with diffuse hypokinesis.  LHC 2013: No CAD   EKG Interpretation   Date/Time:  Wednesday January 05 2015 16:44:33 EDT Ventricular Rate:  107 PR Interval:  164 QRS Duration: 88 QT Interval:  364 QTC Calculation: 486 R Axis:   -19 Text Interpretation:  Sinus tachycardia Biatrial enlargement Borderline  left axis deviation Borderline repolarization abnormality Borderline  prolonged QT interval Sinus tachycardia Artifact Abnormal ekg Confirmed by  Gerhard Munch  MD [phone]) on 01/05/2015 5:37:47 PM      First EKG: Date/Time:  Wednesday January 05 2015 14:43:00 EDT Ventricular Rate:  114 PR Interval:  142 QRS Duration: 82 QT Interval:  360 QTC Calculation: 496 R Axis:   -12 Text Interpretation:  Sinus tachycardia Biatrial enlargement Marked ST  abnormality, possible inferior subendocardial injury Abnormal ECG Sinus  tachycardia ST-t wave abnormality Abnormal ekg Confirmed by Gerhard Munch  MD [phone]) on 01/05/2015 4:36:02 PM     MDM   Final diagnoses:  Chest pain, unspecified chest pain type  Shortness of breath  Lightheadedness  Tachycardia    34 y.o. female here with sudden onset CP/SOB while at rest. Had an episode 4 days ago as well. Trop neg. EKG with baseline wander therefore difficult to discern if ST changes noted, will recheck a new EKG. BMP with hypokalemia, will check Mg and replete here. CBC with WBC 13.2 but H/H hemoconcentrated. CXR showing pleural thickening L>R and L pleural effusion. Pt tachycardic and on OCPs, with pleuritic CP, not reproducible on exam, therefore will obtain Chest CT to r/o PE/dissection. Distal R pedal pulses unable to be found by palpation, will obtain dopplers. Pt states this is normal. Will give ASA, GI cocktail, morphine,  and NTG. Will reassess shortly.    6:18 PM BNP WNL. Mg WNL. Still awaiting CTA. Will reassess shortly.   6:49 PM Second trop neg. Pt in CT.  7:19 PM Pt still feeling some pain, but improved. CT neg for embolism or dissection. Mild cardiomegaly and ectasia of ascending thoracic aorta. Has not been given NTG yet, will have this done, but given her moderate risk HEART score (4), will proceed with admission for ACS rule out.   7:34 PM Dr. Toniann Fail returning page, would like pt to be admitted to stepdown. Please see his note for further documentation of care.  BP 153/93 mmHg  Pulse 101  Temp(Src) 98 F (36.7 C) (Oral)  Resp 26  Ht 5' (1.524 m)  Wt 201 lb (91.173 kg)  BMI 39.26 kg/m2  SpO2 95%  LMP 01/05/2015  Meds ordered this encounter  Medications  . aspirin tablet 325 mg    Sig:   . morphine 4 MG/ML injection 4 mg    Sig:   . gi cocktail (Maalox,Lidocaine,Donnatal)    Sig:   . potassium chloride SA (K-DUR,KLOR-CON) CR tablet 60 mEq    Sig:   . nitroGLYCERIN (NITROSTAT) SL tablet 0.4 mg    Sig:   . iohexol (OMNIPAQUE) 350 MG/ML injection 80 mL    Sig:      Yarah Fuente Camprubi-Soms, PA-C 01/05/15 1934  Gerhard Munch, MD 01/05/15 2054

## 2015-01-05 NOTE — H&P (Signed)
[facility] History and Physical  Meredith Diaz ZOX:[id number] DOB: 1980-09-27 DOA: 01/05/2015  Referring physician: Ms. Hillery Jacks. PCP: Judie Bonus, MD  Specialists: [facility] CARDIOLOGY.  Chief Complaint: Chest pain.  HPI: Meredith Diaz is a 34 y.o. female with history of nonischemic cardiomyopathy last EF measured in July 2016 was 40-45%, congenital heart disease, asthma, sleep apnea and hypothyroidism presents to the ER because of chest pain. Patient started developing chest pain last evening which was pressure-like nonradiating with some diaphoresis and nausea. Patient states she has recently had this type of chest pain. Patient has had recent stress test done in August 1 week which was unremarkable. Since patient chest pain currently was different and given her cardiac risk factors patient has been admitted for further observation for chest pain. Patient chest pain improved on morphine. CT angiogram of the chest was unremarkable.   Review of Systems: As presented in the history of presenting illness, rest negative.  Past Medical History  Diagnosis Date  . Chronic combined systolic and diastolic CHF (congestive heart failure) 07/2011    a. Prior Echo: EF was 25-30%; b. No Sig CAD on CATH; c. No significant finding on Cardiac MRI; d. follow-up Echo 2014 --  EF of 50%;  e. 03/2014 Echo: EF 40-45%, gr 1 DD; f. 11/2014 Echo: EF 40-45%, Gr 2 DD.  [name] Asthma   . Thyroid disease   . Depression   . Anxiety   . Stomach problems   . Essential hypertension   . Congenital heart disease, adult     a. 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  . Nonischemic cardiomyopathy     a. unclear etiology;  b. 01/2012 Cardiac MRI: no infiltrative pathology, EF 52%;  b. 11/2014 Echo: EF 40-45%, gr 2 DD.  . Morbid obesity    Past Surgical History  Procedure Laterality Date  .  Cholecystectomy  2013  . Cardiac catheterization  08/2011    Normal Coronaries - LVEDP 32 mmHg  . Transthoracic echocardiogram  07/2011    Moderate concentric LVH, mildly dilated. EF 25-30% no diastolic dysfunction parameters to suggest elevated LAP  . Cardiac mri  April 2013    No suggestion of a congenital abnormality; EF ~ 52% - no regional wall motion abnormalities., no increased myocardial signal intensity. No perfusion abnormality.  . Transthoracic echocardiogram  December 2015    Last EF by echo 40-45% with global hypokinesis, mild LVH possible apical variant hypertrophic cardiomyopathy    Social History:  reports that she has never smoked. She does not have any smokeless tobacco history on file. She reports that she does not drink alcohol or use illicit drugs. Where does patient live home. Can patient participate in ADLs? Yes.  Allergies  Allergen Reactions  . Lisinopril Swelling    Angioedema facial, mostly just lips, no trouble breathing    Family History:  Family History  Problem Relation Age of Onset  . Depression Mother   . Hypertension Father   . Heart disease Maternal Grandfather   . Heart disease Paternal Grandmother   . Breast cancer Paternal Grandmother   . Colon cancer Maternal Grandmother   . Asthma Paternal Grandmother   . Heart attack Maternal Grandfather   . Heart attack Paternal Grandmother   . Heart attack Paternal Grandfather   . Stroke Neg Hx       Prior to Admission  medications   Medication Sig Start Date End Date Taking? Authorizing Provider  albuterol (PROVENTIL HFA;VENTOLIN HFA) 108 (90 BASE) MCG/ACT inhaler Inhale 2 puffs into the lungs as needed for wheezing or shortness of breath.   Yes Historical Provider, MD  budesonide-formoterol (SYMBICORT) 160-4.5 MCG/ACT inhaler Inhale 2 puffs into the lungs daily. 01/28/14  Yes Judie Bonus, MD  calcium carbonate (TUMS - DOSED IN MG ELEMENTAL CALCIUM) 500 MG chewable tablet Chew 1 tablet by mouth  daily as needed for indigestion or heartburn.   Yes Historical Provider, MD  cholestyramine light (PREVALITE) 4 GM/DOSE powder Take 4 g by mouth as needed (DIARRHEA DUE TO BILE ACIDS).    Yes Historical Provider, MD  clobetasol cream (TEMOVATE) 0.05 % Apply 1 application topically as needed (for Sclerosis).    Yes Historical Provider, MD  clonazePAM (KLONOPIN) 1 MG tablet Take 1 tablet (1 mg total) by mouth 2 (two) times daily. Patient taking differently: Take 1 mg by mouth 2 (two) times daily. Pt takes 0.5mg  in morning and 1.5mg  at night b/c it makes her groggy. 08/26/14  Yes Michele Mcalpine, MD  dicyclomine (BENTYL) 20 MG tablet Take 1 tablet (20 mg total) by mouth as needed for spasms. Patient taking differently: Take 20 mg by mouth daily as needed for spasms.  01/28/14  Yes Judie Bonus, MD  hydrALAZINE (APRESOLINE) 25 MG tablet Take 25 mg by mouth 3 (three) times daily.   Yes Historical Provider, MD  ibuprofen (ADVIL,MOTRIN) 200 MG tablet Take 400 mg by mouth every 6 (six) hours as needed for moderate pain.   Yes Historical Provider, MD  levothyroxine (SYNTHROID, LEVOTHROID) 75 MCG tablet Take 1 tablet (75 mcg total) by mouth daily. 11/01/14  Yes Judie Bonus, MD  metolazone (ZAROXOLYN) 2.5 MG tablet Take 2.5 mg by mouth as needed (swelling).    Yes Historical Provider, MD  metoprolol succinate (TOPROL XL) 25 MG 24 hr tablet Take 1 tablet (25 mg total) by mouth daily. 03/08/14  Yes Marykay Lex, MD  norethindrone-ethinyl estradiol 1/35 (ALAYCEN 1/35) tablet Take 1 tablet by mouth daily. 01/28/14  Yes Judie Bonus, MD  omeprazole (PRILOSEC) 40 MG capsule Take 40 mg by mouth 2 (two) times daily. Pt takes BID   Yes Historical Provider, MD  potassium chloride SA (K-DUR,KLOR-CON) 20 MEQ tablet Take 40 mEq by mouth 2 (two) times daily.   Yes Historical Provider, MD  torsemide (DEMADEX) 20 MG tablet Take 1 tablet (20 mg total) by mouth daily. Patient taking differently: Take 40 mg by  mouth daily.  12/09/14  Yes Ok Anis, NP  vitamin B-12 (CYANOCOBALAMIN) 1000 MCG tablet Take 1 tablet (1,000 mcg total) by mouth daily. 04/13/14  Yes Judie Bonus, MD  furosemide (LASIX) 40 MG tablet TAKE 1 TABLET (40 MG TOTAL) BY MOUTH DAILY. Patient not taking: Reported on 12/16/2014 12/15/14   Judie Bonus, MD    Physical Exam: Filed Vitals:   01/05/15 1903 01/05/15 1915 01/05/15 2000 01/05/15 2045  BP: 153/93 147/99 130/79 139/89  Pulse: 101 98 95 94  Temp:      TempSrc:      Resp: 26 20 19 22   Height:      Weight:      SpO2: 95% 99% 88% 93%     General:  Moderately built and nourished.  Eyes: anicteric no pallor.  ENT: no discharge from ears eyes nose and mouth.  Neck: no JVD appreciated. No mass felt.  Cardiovascular:  S1-S2 heard.  Respiratory: no rhonchi or crepitations.  Abdomen: soft mild epigastric tenderness or guarding or rigidity.  Skin: no rash.  Musculoskeletal: no edema.  Psychiatric: appears normal.  Neurologic: alert awake oriented to time place and person. Moves all extremities.  Labs on Admission:  Basic Metabolic Panel:  Recent Labs Lab 01/05/15 1504  NA 137  K 3.0*  CL 93*  CO2 31  GLUCOSE 129*  BUN 13  CREATININE 1.19*  CALCIUM 9.0  MG 1.8   Liver Function Tests: No results for input(s): AST, ALT, ALKPHOS, BILITOT, PROT, ALBUMIN in the last 168 hours. No results for input(s): LIPASE, AMYLASE in the last 168 hours. No results for input(s): AMMONIA in the last 168 hours. CBC:  Recent Labs Lab 01/05/15 1504  WBC 13.2*  HGB 15.1*  HCT 44.5  MCV 91.0  PLT 419*   Cardiac Enzymes: No results for input(s): CKTOTAL, CKMB, CKMBINDEX, TROPONINI in the last 168 hours.  BNP (last 3 results)  Recent Labs  01/05/15 1504  BNP 18.8    ProBNP (last 3 results)  Recent Labs  03/15/14 0918 11/01/14 0849  PROBNP 104.80 37.0    CBG: No results for input(s): GLUCAP in the last 168 hours.  Radiological  Exams on Admission: Dg Chest 2 View  01/05/2015   CLINICAL DATA:  Chest pain and shortness of breath for 1 day.  EXAM: CHEST  2 VIEW  COMPARISON:  None.  FINDINGS: Cardiomediastinal silhouette is normal. Mediastinal contours are prominent.  There is no evidence of focal airspace consolidation, or pneumothorax. There is thickening of the left more than right medial pleura. There may be a loculated left pleural effusion, evident by elevation of the left hemidiaphragm.  Osseous structures are without acute abnormality. Mild rotational scoliosis of the thoracic spine is noted. Soft tissues are grossly normal.  IMPRESSION: Thickening of the left more than right pleura centrally, with possible loculated left pleural effusion.  Mild prominence of the mediastinal contours, which may be partially positional. If vascular pathology is suspected, CT of the chest may be considered.   Electronically Signed   By: Ted Mcalpine M.D.   On: 01/05/2015 15:42   Ct Angio Chest Pe W/cm &/or Wo Cm  01/05/2015   CLINICAL DATA:  Chest pain shortness of breath since this 4 p.m. this afternoon.  EXAM: CT ANGIOGRAPHY CHEST WITH CONTRAST  TECHNIQUE: Multidetector CT imaging of the chest was performed using the standard protocol during bolus administration of intravenous contrast. Multiplanar CT image reconstructions and MIPs were obtained to evaluate the vascular anatomy.  CONTRAST:  OMNIPAQUE IOHEXOL 350 MG/ML SOLN  COMPARISON:  Chest x-ray today.  FINDINGS: Examination demonstrates prominent overlying soft tissues. Lungs are adequately inflated without lobar consolidation or effusion. Minimal linear atelectasis over the medial left base. Airways are within normal.  There is mild cardiomegaly. There is no evidence of pulmonary embolism. There is mild ectasia of the ascending thoracic aorta measuring 3.7 cm in AP diameter. Remaining mediastinal structures are within normal. There is no hilar, mediastinal or axillary adenopathy.   Images through the upper abdomen are within normal.  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 There is also fusion of several posterior ribs bilaterally.  Review of the MIP images confirms the above findings.  IMPRESSION: No acute cardiopulmonary disease. No evidence of pulmonary embolism.  Cardiomegaly.  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  Electronically Signed   By: Elberta Fortis M.D.   On: 01/05/2015 19:05    EKG: Independently reviewed. Sinus tachycardia with nonspecific ST-T changes.  Assessment/Plan Principal Problem:   Chest pain Active Problems:   Asthma, chronic   Chronic combined systolic and diastolic heart failure, NYHA class 2   Essential hypertension   OSA on CPAP   1. Chest pain - patient has had a normal stress test early part of last month. Given the patient's change in chest pain character at this time has been admitted to rule out ACS. Will keep patient nothing by mouth in a.m. I have consulted cardiology. Aspirin. 2. Nonischemic cardiomyopathy last EF measured as 40-45% - appears compensated continue torsemide. Not on ACE inhibitor secondary to allergy. 3. OSA on C Pap. 4. Asthma personally not wheezing. 5. Hypothyroidism continue present medication.  I have reviewed patient's old chart and labs. Personally reviewed patient's chest x-ray.   DVT ProphylaxisLovenox.  Code Status: full code.  Family Communication: discussed with family.  Disposition Plan: admit for observation.    Rishi Vicario N. [facility] Pager [phone].  If 7PM-7AM, please contact night-coverage [email or web address] Password [facility] 01/05/2015, 9:13 PM

## 2015-01-06 ENCOUNTER — Other Ambulatory Visit: Payer: Self-pay | Admitting: Physician Assistant

## 2015-01-06 DIAGNOSIS — I10 Essential (primary) hypertension: Secondary | ICD-10-CM

## 2015-01-06 DIAGNOSIS — R0789 Other chest pain: Secondary | ICD-10-CM | POA: Diagnosis not present

## 2015-01-06 DIAGNOSIS — I5042 Chronic combined systolic (congestive) and diastolic (congestive) heart failure: Secondary | ICD-10-CM

## 2015-01-06 DIAGNOSIS — R072 Precordial pain: Secondary | ICD-10-CM

## 2015-01-06 LAB — URINALYSIS, ROUTINE W REFLEX MICROSCOPIC
GLUCOSE, UA: NEGATIVE mg/dL
Ketones, ur: NEGATIVE mg/dL
Leukocytes, UA: NEGATIVE
Nitrite: NEGATIVE
PH: 6 (ref 5.0–8.0)
Protein, ur: 30 mg/dL — AB
SPECIFIC GRAVITY, URINE: 1.043 — AB (ref 1.005–1.030)
UROBILINOGEN UA: 0.2 mg/dL (ref 0.0–1.0)

## 2015-01-06 LAB — TROPONIN I: Troponin I: <0.03 ng/mL (ref <0.031)

## 2015-01-06 LAB — CBC WITH DIFFERENTIAL/PLATELET
BASOS ABS: 0 10*3/uL (ref 0.0–0.1)
Basophils Relative: 0 % (ref 0–1)
EOS PCT: 3 % (ref 0–5)
Eosinophils Absolute: 0.3 10*3/uL (ref 0.0–0.7)
HCT: 40.1 % (ref 36.0–46.0)
Hemoglobin: 13.2 g/dL (ref 12.0–15.0)
LYMPHS PCT: 35 % (ref 12–46)
Lymphs Abs: 3.4 10*3/uL (ref 0.7–4.0)
MCH: 30.1 pg (ref 26.0–34.0)
MCHC: 32.9 g/dL (ref 30.0–36.0)
MCV: 91.3 fL (ref 78.0–100.0)
MONO ABS: 0.9 10*3/uL (ref 0.1–1.0)
MONOS PCT: 9 % (ref 3–12)
Neutro Abs: 5.2 10*3/uL (ref 1.7–7.7)
Neutrophils Relative %: 53 % (ref 43–77)
PLATELETS: 373 10*3/uL (ref 150–400)
RBC: 4.39 MIL/uL (ref 3.87–5.11)
RDW: 13.2 % (ref 11.5–15.5)
WBC: 9.9 10*3/uL (ref 4.0–10.5)

## 2015-01-06 LAB — COMPREHENSIVE METABOLIC PANEL
ALBUMIN: 3.3 g/dL — AB (ref 3.5–5.0)
ALK PHOS: 70 U/L (ref 38–126)
ALT: 36 U/L (ref 14–54)
AST: 27 U/L (ref 15–41)
Anion gap: 10 (ref 5–15)
BILIRUBIN TOTAL: 0.5 mg/dL (ref 0.3–1.2)
BUN: 15 mg/dL (ref 6–20)
CO2: 28 mmol/L (ref 22–32)
Calcium: 8.8 mg/dL — ABNORMAL LOW (ref 8.9–10.3)
Chloride: 98 mmol/L — ABNORMAL LOW (ref 101–111)
Creatinine, Ser: 1.11 mg/dL — ABNORMAL HIGH (ref 0.44–1.00)
GFR calc Af Amer: >60 mL/min (ref >60)
GFR calc non Af Amer: >60 mL/min (ref >60)
GLUCOSE: 118 mg/dL — AB (ref 65–99)
POTASSIUM: 4 mmol/L (ref 3.5–5.1)
Sodium: 136 mmol/L (ref 135–145)
TOTAL PROTEIN: 7.5 g/dL (ref 6.5–8.1)

## 2015-01-06 LAB — URINE MICROSCOPIC-ADD ON

## 2015-01-06 LAB — PREGNANCY, URINE: Preg Test, Ur: NEGATIVE

## 2015-01-06 MED ORDER — HYDROCODONE-ACETAMINOPHEN 5-325 MG PO TABS: 1.0000 | ORAL_TABLET | Freq: Four times a day (QID) | ORAL | Status: DC | PRN

## 2015-01-06 MED ORDER — SPIRONOLACTONE 25 MG PO TABS
12.5000 mg | ORAL_TABLET | Freq: Every day | ORAL | Status: DC
  Administered 2015-01-06: 12.5 mg via ORAL
  Filled 2015-01-06: qty 1

## 2015-01-06 MED ORDER — POTASSIUM CHLORIDE CRYS ER 20 MEQ PO TBCR
20.0000 meq | EXTENDED_RELEASE_TABLET | Freq: Two times a day (BID) | ORAL | Status: DC
  Administered 2015-01-06: 20 meq via ORAL
  Filled 2015-01-06: qty 1

## 2015-01-06 MED ORDER — ESOMEPRAZOLE MAGNESIUM 40 MG PO PACK: 40.0000 mg | PACK | Freq: Every day | ORAL | Status: DC

## 2015-01-06 MED ORDER — SPIRONOLACTONE 25 MG PO TABS: 12.5000 mg | ORAL_TABLET | Freq: Every day | ORAL | Status: DC

## 2015-01-06 NOTE — Progress Notes (Signed)
Pt was on bed alarm. Pt got up and was scared by bed alarm when it went off. Pt requested that the bed alarm not be turned back on. Pt educated. Pt still refused. Will continue to monitor.   Tera Helper E

## 2015-01-06 NOTE — Progress Notes (Signed)
CARDIOLOGY CONSULT NOTE  Patient ID: Meredith Diaz, MRN: [id number], DOB/AGE: [birth year] 34 y.o. Admit date: 01/05/2015 Date of Consult: 01/06/2015  Primary Physician: Judie Bonus, MD Primary Cardiologist: Herbie Baltimore Referring Physician: Toniann Fail  Chief Complaint: Chest pain Reason for Consultation: Chest pain  HPI: 34 year old woman with history of nonischemic cardiomyopathy and chronic mixed systolic and diastolic heart failure, presents with pressure-like substernal chest pain. She describes pain that has occurred over the last 3 days. Pain was long-standing yesterday and has decreased this morning. At one point the pain is described as "severe." There is no associated shortness of breath. She did have some nausea with this. There are no exacerbating factors.  The patient has a fairly extensive cardiac history. 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 She underwent repair in childhood. She has undergone a cardiac MRI in 2013 demonstrated no significant abnormality including no evidence of infiltrative pathology. Her calculated LVEF was 52% at that time. The cardiac catheterization done at [facility] in 2013 showed normal coronary arteries by report. A recent echocardiogram showed LVEF of 45% with diastolic dysfunction. A recent treadmill study showed no significant ischemic changes. She did have poor exercise tolerance.  Review of data this admission shows a normal chest CTA with no evidence of pulmonary embolism or aortic pathology. Serial troponin studies have been normal.  Medical History:  Past Medical History  Diagnosis Date  . Chronic combined systolic and diastolic CHF (congestive heart failure) 07/2011    a. Prior Echo: EF was 25-30%; b. No Sig CAD on CATH; c. No significant finding on Cardiac MRI; d. follow-up Echo 2014 --  EF of 50%;  e. 03/2014 Echo: EF 40-45%, gr 1 DD; f. 11/2014 Echo: EF 40-45%,  Gr 2 DD.  [name] Asthma   . Thyroid disease   . Depression   . Anxiety   . Stomach problems   . Essential hypertension   . Congenital heart disease, adult     a. 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  . Nonischemic cardiomyopathy     a. unclear etiology;  b. 01/2012 Cardiac MRI: no infiltrative pathology, EF 52%;  b. 11/2014 Echo: EF 40-45%, gr 2 DD.  . Morbid obesity       Surgical History:  Past Surgical History  Procedure Laterality Date  . Cholecystectomy  2013  . Cardiac catheterization  08/2011    Normal Coronaries - LVEDP 32 mmHg  . Transthoracic echocardiogram  07/2011    Moderate concentric LVH, mildly dilated. EF 25-30% no diastolic dysfunction parameters to suggest elevated LAP  . Cardiac mri  April 2013    No suggestion of a congenital abnormality; EF ~ 52% - no regional wall motion abnormalities., no increased myocardial signal intensity. No perfusion abnormality.  . Transthoracic echocardiogram  December 2015    Last EF by echo 40-45% with global hypokinesis, mild LVH possible apical variant hypertrophic cardiomyopathy      Home Meds: Prior to Admission medications   Medication Sig Start Date End Date Taking? Authorizing Provider  albuterol (PROVENTIL HFA;VENTOLIN HFA) 108 (90 BASE) MCG/ACT inhaler Inhale 2 puffs into the lungs as needed for wheezing or shortness of breath.   Yes Historical Provider, MD  budesonide-formoterol (SYMBICORT) 160-4.5 MCG/ACT inhaler Inhale 2 puffs into the lungs daily. 01/28/14  Yes Judie Bonus, MD  calcium carbonate (TUMS - DOSED IN MG ELEMENTAL CALCIUM) 500 MG chewable tablet Chew 1 tablet by mouth daily as needed for indigestion or heartburn.   Yes Historical Provider, MD  cholestyramine light (PREVALITE) 4 GM/DOSE powder Take 4 g by mouth as needed (DIARRHEA DUE TO BILE ACIDS).    Yes Historical Provider, MD  clobetasol  cream (TEMOVATE) 0.05 % Apply 1 application topically as needed (for Sclerosis).    Yes Historical Provider, MD  clonazePAM (KLONOPIN) 1 MG tablet Take 1 tablet (1 mg total) by mouth 2 (two) times daily. Patient taking differently: Take 1 mg by mouth 2 (two) times daily. Pt takes 0.5mg  in morning and 1.5mg  at night b/c it makes her groggy. 08/26/14  Yes Michele Mcalpine, MD  dicyclomine (BENTYL) 20 MG tablet Take 1 tablet (20 mg total) by mouth as needed for spasms. Patient taking differently: Take 20 mg by mouth daily as needed for spasms.  01/28/14  Yes Judie Bonus, MD  hydrALAZINE (APRESOLINE) 25 MG tablet Take 25 mg by mouth 3 (three) times daily.   Yes Historical Provider, MD  ibuprofen (ADVIL,MOTRIN) 200 MG tablet Take 400 mg by mouth every 6 (six) hours as needed for moderate pain.   Yes Historical Provider, MD  levothyroxine (SYNTHROID, LEVOTHROID) 75 MCG tablet Take 1 tablet (75 mcg total) by mouth daily. 11/01/14  Yes Judie Bonus, MD  metolazone (ZAROXOLYN) 2.5 MG tablet Take 2.5 mg by mouth as needed (swelling).    Yes Historical Provider, MD  metoprolol succinate (TOPROL XL) 25 MG 24 hr tablet Take 1 tablet (25 mg total) by mouth daily. 03/08/14  Yes Marykay Lex, MD  norethindrone-ethinyl estradiol 1/35 (ALAYCEN 1/35) tablet Take 1 tablet by mouth daily. 01/28/14  Yes Judie Bonus, MD  omeprazole (PRILOSEC) 40 MG capsule Take 40 mg by mouth 2 (two) times daily. Pt takes BID   Yes Historical Provider, MD  potassium chloride SA (K-DUR,KLOR-CON) 20 MEQ tablet Take 40 mEq by mouth 2 (two) times daily.   Yes Historical Provider, MD  torsemide (DEMADEX) 20 MG tablet Take 1 tablet (20 mg total) by mouth daily. Patient taking differently: Take 40 mg by mouth daily.  12/09/14  Yes Ok Anis, NP  vitamin B-12 (CYANOCOBALAMIN) 1000 MCG tablet Take 1 tablet (1,000 mcg total) by mouth daily. 04/13/14  Yes Judie Bonus, MD  furosemide (LASIX) 40 MG tablet TAKE 1 TABLET (40  MG TOTAL) BY MOUTH DAILY. Patient not taking: Reported on 12/16/2014 12/15/14   Judie Bonus, MD    Inpatient Medications:  . aspirin EC  325 mg Oral Daily  . budesonide-formoterol  2 puff Inhalation Daily  . clonazePAM  1 mg Oral BID  . enoxaparin (LOVENOX) injection  40 mg Subcutaneous Q24H  . hydrALAZINE  25 mg Oral TID  . levothyroxine  75 mcg Oral QAC breakfast  . metoprolol succinate  25 mg Oral Daily  . pantoprazole  40 mg Oral Daily  . potassium chloride SA  40 mEq Oral BID  . torsemide  40 mg Oral Daily  . vitamin B-12  1,000 mcg Oral Daily      Allergies:  Allergies  Allergen Reactions  . Lisinopril Swelling    Angioedema facial, mostly just lips, no trouble breathing    Social History   Social History  . Marital Status: Married    Spouse Name: N/A  . Number of Children: N/A  . Years of Education: N/A   Occupational History  .  editor    Social History Main Topics  . Smoking status: Never Smoker   . Smokeless tobacco: Not on file  . Alcohol Use: No  . Drug Use: No  . Sexual Activity: Not on file   Other Topics Concern  . Not on file   Social History Narrative     Family History  Problem Relation Age of Onset  . Depression Mother   . Hypertension Father   . Heart disease Maternal Grandfather   . Heart disease Paternal Grandmother   . Breast cancer Paternal Grandmother   . Colon cancer Maternal Grandmother   . Asthma Paternal Grandmother   . Heart attack Maternal Grandfather   . Heart attack Paternal Grandmother   . Heart attack Paternal Grandfather   . Stroke Neg Hx      Review of Systems: General: negative for chills, fever, night sweats or weight changes.  ENT: negative for rhinorrhea or epistaxis Cardiovascular: See history of present illness Dermatological: negative for rash Respiratory: negative for cough or wheezing GI: negative for nausea, vomiting, diarrhea, bright red blood per rectum, melena, or hematemesis GU: no  hematuria, urgency, or frequency Neurologic: negative for visual changes, syncope, headache, or dizziness Heme: no easy bruising or bleeding Endo: negative for excessive thirst, thyroid disorder, or flushing Musculoskeletal: negative for joint pain or swelling, negative for myalgias  All other systems reviewed and are otherwise negative except as noted above.  Physical Exam: Blood pressure 120/64, pulse 103, temperature 98.1 F (36.7 C), temperature source Oral, resp. rate 18, height 5' (1.524 m), weight 199 lb 12.8 oz (90.629 kg), last menstrual period 01/05/2015, SpO2 93 %. Pt is alert and oriented, pleasant, morbidly obese woman, in no distress. HEENT: normal Neck: JVP normal. Carotid upstrokes normal without bruits. No thyromegaly. Lungs: equal expansion, clear bilaterally CV: Apex is discrete and nondisplaced, tachycardic and regular without murmur or gallop Abd: soft, NT, +BS, no bruit, no hepatosplenomegaly Back: no CVA tenderness Ext: no C/C/E        DP/PT pulses intact and = Skin: warm and dry without rash Neuro: CNII-XII intact             Strength intact = bilaterally    Labs:  Recent Labs  01/05/15 2226 01/06/15 0352  TROPONINI <0.03 <0.03   Lab Results  Component Value Date   WBC 9.9 01/06/2015   HGB 13.2 01/06/2015   HCT 40.1 01/06/2015   MCV 91.3 01/06/2015   PLT 373 01/06/2015    Recent Labs Lab 01/06/15 0352  NA 136  K 4.0  CL 98*  CO2 28  BUN 15  CREATININE 1.11*  CALCIUM 8.8*  PROT 7.5  BILITOT 0.5  ALKPHOS 70  ALT 36  AST 27  GLUCOSE 118*   Lab Results  Component Value Date   CHOL 176 01/28/2014   HDL 49.80 01/28/2014   LDLCALC 107* 01/28/2014   TRIG 96.0 01/28/2014   No results found for: DDIMER  Radiology/Studies:  Dg Chest 2 View  01/05/2015   CLINICAL DATA:  Chest pain and shortness of breath for 1 day.  EXAM: CHEST  2 VIEW  COMPARISON:  None.  FINDINGS: Cardiomediastinal silhouette is normal. Mediastinal contours are  prominent.  There is no evidence of focal airspace consolidation, or pneumothorax. There is thickening of the left more than right medial pleura. There may be a loculated left pleural effusion, evident by elevation of the left hemidiaphragm.  Osseous structures are without acute abnormality. Mild rotational scoliosis of the  thoracic spine is noted. Soft tissues are grossly normal.  IMPRESSION: Thickening of the left more than right pleura centrally, with possible loculated left pleural effusion.  Mild prominence of the mediastinal contours, which may be partially positional. If vascular pathology is suspected, CT of the chest may be considered.   Electronically Signed   By: Ted Mcalpine M.D.   On: 01/05/2015 15:42   Ct Angio Chest Pe W/cm &/or Wo Cm  01/05/2015   CLINICAL DATA:  Chest pain shortness of breath since this 4 p.m. this afternoon.  EXAM: CT ANGIOGRAPHY CHEST WITH CONTRAST  TECHNIQUE: Multidetector CT imaging of the chest was performed using the standard protocol during bolus administration of intravenous contrast. Multiplanar CT image reconstructions and MIPs were obtained to evaluate the vascular anatomy.  CONTRAST:  OMNIPAQUE IOHEXOL 350 MG/ML SOLN  COMPARISON:  Chest x-ray today.  FINDINGS: Examination demonstrates prominent overlying soft tissues. Lungs are adequately inflated without lobar consolidation or effusion. Minimal linear atelectasis over the medial left base. Airways are within normal.  There is mild cardiomegaly. There is no evidence of pulmonary embolism. There is mild ectasia of the ascending thoracic aorta measuring 3.7 cm in AP diameter. Remaining mediastinal structures are within normal. There is no hilar, mediastinal or axillary adenopathy.  Images through the upper abdomen are within normal.  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 There is also fusion of several posterior ribs bilaterally.  Review of the MIP images confirms the above  findings.  IMPRESSION: No acute cardiopulmonary disease. No evidence of pulmonary embolism.  Cardiomegaly.  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   Electronically Signed   By: Elberta Fortis M.D.   On: 01/05/2015 19:05    EKG: Sinus tachycardia, biatrial enlargement, nonspecific ST abnormality  Cardiac Studies: CTA Chest: IMPRESSION: No acute cardiopulmonary disease. No evidence of pulmonary embolism.  Cardiomegaly.  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  2D Echo 11/18/2014: Study Conclusions  - Left ventricle: The cavity size was normal. Wall thickness was normal. Systolic function was mildly to moderately reduced. The estimated ejection fraction was in the range of 40% to 45%. Diffuse hypokinesis. Doppler parameters are consistent with pseudonormal left ventricular relaxation (grade 2 diastolic dysfunction). The E/e&' ratio is >15, suggesting elevated LV filling pressure. The E/A ratio is 1.5. - Left atrium: The atrium was normal in size.  Impressions:  - Compared to the prior study in 2015, there appears to be worsening diastolic function with elevated LV filling pressure. LVEF remains 40-45% with diffuse hypokinesis.  GXT: Stress Findings    ECG Sinus Tach, 120, 1-1.61mm flat ST dep in leads II, III, aVF, V3-V6. [name]   Stress Findings The patient exercised following the Bruce protocol.  The patient reported shortness of breath and fatigue during the stress test. Leg fatigue. The patient experienced no angina during the stress test.   The test was stopped because the patient complained of fatigue and shortness of breath.   Blood pressure demonstrated a hypertensive response to exercise. Heart rate demonstrated an exaggerated response to exercise. Overall, the  patient's exercise capacity was moderately impaired.   Recovery time: 5 minutes. The patient's response to exercise was adequate for diagnosis.   Response to Stress No T wave inversion was noted during stress. Arrhythmias during stress: rare PVCs.  Arrhythmias  during recovery: none.  Arrhythmias were not significant.  ECG was interpretable and non-conclusive.  Baseline flat ST depression that becomes upsloping with exercise. No worsening of baseline ST depression. Poor exercise tolerance. Hypertensive response to exercise. Study was performed to assess exercise tolerance and establish baseline for cardiopulmonary rehab, which she starts next week. No further ischemic eval at this time.    ASSESSMENT AND PLAN:  1. Chest pain: Differential diagnosis includes acute coronary syndrome, diastolic heart failure with elevated filling pressures, GI causes of chest pain such as GERD/esophageal spasm, and other forms of noncardiac pain (musculoskeletal). The patient is at very low risk of acute coronary syndrome at age 52 with normal coronary arteries a few years ago by cardiac catheterization. There is no objective evidence of ischemia/infarction with serial troponins in the normal range. She has been noted on recent echo studies to have significant diastolic dysfunction. The pressure-like characteristic of her pain raises the possibility of elevated cardiac filling pressures. I do not think she will tolerate long-acting nitrates that she had a significant headache with one sublingual nitroglycerin administered yesterday. Recommend the addition of spironolactone 12.5 mg daily. She continues on hydralazine. She is unable to take ACE/ARB because of angioedema. I have recommended that she decrease her potassium supplementation to 20 mEq twice daily. Will arrange follow-up labs next week. She has an appointment scheduled with Dr. Herbie Baltimore in a few weeks.  2. Chronic diastolic/systolic heart failure: Continue  torsemide 40 mg daily. Add spironolactone. Continue to work on lifestyle modification. I advised that she can return to cardiac rehabilitation next week, but should probably take tomorrow off.  SignedTonny Bollman MD, [facility] 01/06/2015, 7:56 AM

## 2015-01-06 NOTE — Discharge Summary (Signed)
Physician Discharge Summary  Meredith Diaz XIH:[id number] DOB: [birth year] DOA: 01/05/2015  PCP: Judie Bonus, MD  Admit date: 01/05/2015 Discharge date: 01/06/2015   Recommendations for Outpatient Follow-up:  1. Monitor BMET  Discharge Diagnoses:  Principal Problem:   Chest pain Active Problems:   Asthma, chronic   Chronic combined systolic and diastolic heart failure, NYHA class 2   Essential hypertension   OSA on CPAP   Discharge Condition: stable  Diet recommendation: heart healthy  Filed Weights   01/05/15 1443 01/05/15 2121 01/06/15 0415  Weight: 91.173 kg (201 lb) 90.629 kg (199 lb 12.8 oz) 90.629 kg (199 lb 12.8 oz)    History of present illness:  34 y.o. female with history of nonischemic cardiomyopathy last EF measured in July 2016 was 40-45%, congenital heart disease, asthma, sleep apnea and hypothyroidism presents to the ER because of chest pain. Patient started developing chest pain last evening which was pressure-like nonradiating with some diaphoresis and nausea. Patient states she has recently had this type of chest pain. Patient has had recent stress test done in August 1 week which was unremarkable. Since patient chest pain currently was different and given her cardiac risk factors patient has been admitted for further observation for chest pain. Patient chest pain improved on morphine. CT angiogram of the chest was unremarkable.    Hospital Course:  Observed on telemetry.  Cardiology consulted.  Felt likelihood of coronary disease very low and felt could be from CM.  Recommended adding spironolactone. Will need BMET checked as outpatient.   Also, consider GERD. Started on trial of nexium. Procedures:  none  Consultations:  cardiology  Discharge Exam: Filed Vitals:   01/06/15 0855  BP: 135/85  Pulse: 99  Temp: 97.9 F (36.6 C)  Resp: 18    General: comfortable Cardiovascular: RRR Respiratory: CTA  Discharge Instructions   Discharge  Instructions    Diet - low sodium heart healthy    Complete by:  As directed           Current Discharge Medication List    START taking these medications   Details  esomeprazole (NEXIUM) 40 MG packet Take 40 mg by mouth daily before breakfast. Qty: 30 each, Refills: 1    HYDROcodone-acetaminophen (NORCO/VICODIN) 5-325 MG per tablet Take 1 tablet by mouth every 6 (six) hours as needed. Qty: 10 tablet, Refills: 0    spironolactone (ALDACTONE) 25 MG tablet Take 0.5 tablets (12.5 mg total) by mouth daily. Qty: 30 tablet, Refills: 1      CONTINUE these medications which have NOT CHANGED   Details  albuterol (PROVENTIL HFA;VENTOLIN HFA) 108 (90 BASE) MCG/ACT inhaler Inhale 2 puffs into the lungs as needed for wheezing or shortness of breath.    budesonide-formoterol (SYMBICORT) 160-4.5 MCG/ACT inhaler Inhale 2 puffs into the lungs daily. Qty: 1 Inhaler, Refills: 12    calcium carbonate (TUMS - DOSED IN MG ELEMENTAL CALCIUM) 500 MG chewable tablet Chew 1 tablet by mouth daily as needed for indigestion or heartburn.    cholestyramine light (PREVALITE) 4 GM/DOSE powder Take 4 g by mouth as needed (DIARRHEA DUE TO BILE ACIDS).     clobetasol cream (TEMOVATE) 0.05 % Apply 1 application topically as needed (for Sclerosis).    Associated Diagnoses: Chronic combined systolic and diastolic CHF (congestive heart failure)    clonazePAM (KLONOPIN) 1 MG tablet Take 1 tablet (1 mg total) by mouth 2 (two) times daily. Qty: 60 tablet, Refills: 5    dicyclomine (BENTYL) 20 MG  tablet Take 1 tablet (20 mg total) by mouth as needed for spasms. Qty: 60 tablet, Refills: 3    hydrALAZINE (APRESOLINE) 25 MG tablet Take 25 mg by mouth 3 (three) times daily.   Associated Diagnoses: Chronic combined systolic and diastolic CHF (congestive heart failure)    ibuprofen (ADVIL,MOTRIN) 200 MG tablet Take 400 mg by mouth every 6 (six) hours as needed for moderate pain.    levothyroxine (SYNTHROID, LEVOTHROID)  75 MCG tablet Take 1 tablet (75 mcg total) by mouth daily. Qty: 30 tablet, Refills: 3    metolazone (ZAROXOLYN) 2.5 MG tablet Take 2.5 mg by mouth as needed (swelling).    Associated Diagnoses: Chronic combined systolic and diastolic CHF (congestive heart failure)    metoprolol succinate (TOPROL XL) 25 MG 24 hr tablet Take 1 tablet (25 mg total) by mouth daily. Qty: 30 tablet, Refills: 11   Associated Diagnoses: Acute diastolic heart failure; Chronic diastolic heart failure    norethindrone-ethinyl estradiol 1/35 (ALAYCEN 1/35) tablet Take 1 tablet by mouth daily. Qty: 1 Package, Refills: 12    potassium chloride SA (K-DUR,KLOR-CON) 20 MEQ tablet Take 40 mEq by mouth 2 (two) times daily.    torsemide (DEMADEX) 20 MG tablet Take 1 tablet (20 mg total) by mouth daily. Qty: 60 tablet, Refills: 6    vitamin B-12 (CYANOCOBALAMIN) 1000 MCG tablet Take 1 tablet (1,000 mcg total) by mouth daily. Qty: 30 tablet, Refills: 6    furosemide (LASIX) 40 MG tablet TAKE 1 TABLET (40 MG TOTAL) BY MOUTH DAILY. Qty: 30 tablet, Refills: 6      STOP taking these medications     omeprazole (PRILOSEC) 40 MG capsule        Allergies  Allergen Reactions  . Lisinopril Swelling    Angioedema facial, mostly just lips, no trouble breathing   Follow-up Information    Follow up with Judie Bonus, MD In 2 months.   Specialty:  Internal Medicine   Why:  If symptoms worsen   Contact information:   [address] [place] [address] [phone]        The results of significant diagnostics from this hospitalization (including imaging, microbiology, ancillary and laboratory) are listed below for reference.    Significant Diagnostic Studies: Dg Chest 2 View  01/05/2015   CLINICAL DATA:  Chest pain and shortness of breath for 1 day.  EXAM: CHEST  2 VIEW  COMPARISON:  None.  FINDINGS: Cardiomediastinal silhouette is normal. Mediastinal contours are prominent.  There is no evidence of focal  airspace consolidation, or pneumothorax. There is thickening of the left more than right medial pleura. There may be a loculated left pleural effusion, evident by elevation of the left hemidiaphragm.  Osseous structures are without acute abnormality. Mild rotational scoliosis of the thoracic spine is noted. Soft tissues are grossly normal.  IMPRESSION: Thickening of the left more than right pleura centrally, with possible loculated left pleural effusion.  Mild prominence of the mediastinal contours, which may be partially positional. If vascular pathology is suspected, CT of the chest may be considered.   Electronically Signed   By: Ted Mcalpine M.D.   On: 01/05/2015 15:42   Ct Angio Chest Pe W/cm &/or Wo Cm  01/05/2015   CLINICAL DATA:  Chest pain shortness of breath since this 4 p.m. this afternoon.  EXAM: CT ANGIOGRAPHY CHEST WITH CONTRAST  TECHNIQUE: Multidetector CT imaging of the chest was performed using the standard protocol during bolus administration of intravenous contrast. Multiplanar  CT image reconstructions and MIPs were obtained to evaluate the vascular anatomy.  CONTRAST:  OMNIPAQUE IOHEXOL 350 MG/ML SOLN  COMPARISON:  Chest x-ray today.  FINDINGS: Examination demonstrates prominent overlying soft tissues. Lungs are adequately inflated without lobar consolidation or effusion. Minimal linear atelectasis over the medial left base. Airways are within normal.  There is mild cardiomegaly. There is no evidence of pulmonary embolism. There is mild ectasia of the ascending thoracic aorta measuring 3.7 cm in AP diameter. Remaining mediastinal structures are within normal. There is no hilar, mediastinal or axillary adenopathy.  Images through the upper abdomen are within normal.  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 There is also fusion of several posterior ribs bilaterally.  Review of the MIP images confirms the above findings.  IMPRESSION: No acute  cardiopulmonary disease. No evidence of pulmonary embolism.  Cardiomegaly.  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   Electronically Signed   By: Elberta Fortis M.D.   On: 01/05/2015 19:05    Microbiology: No results found for this or any previous visit (from the past 240 hour(s)).   Labs: Basic Metabolic Panel:  Recent Labs Lab 01/05/15 1504 01/06/15 0352  NA 137 136  K 3.0* 4.0  CL 93* 98*  CO2 31 28  GLUCOSE 129* 118*  BUN 13 15  CREATININE 1.19* 1.11*  CALCIUM 9.0 8.8*  MG 1.8  --    Liver Function Tests:  Recent Labs Lab 01/05/15 2226 01/06/15 0352  AST 31 27  ALT 39 36  ALKPHOS 75 70  BILITOT 0.6 0.5  PROT 7.8 7.5  ALBUMIN 3.3* 3.3*    Recent Labs Lab 01/05/15 2226  LIPASE 18*   No results for input(s): AMMONIA in the last 168 hours. CBC:  Recent Labs Lab 01/05/15 1504 01/06/15 0352  WBC 13.2* 9.9  NEUTROABS  --  5.2  HGB 15.1* 13.2  HCT 44.5 40.1  MCV 91.0 91.3  PLT 419* 373   Cardiac Enzymes:  Recent Labs Lab 01/05/15 2226 01/06/15 0352 01/06/15 0945  TROPONINI <0.03 <0.03 <0.03   BNP: BNP (last 3 results)  Recent Labs  01/05/15 1504  BNP 18.8    ProBNP (last 3 results)  Recent Labs  03/15/14 0918 11/01/14 0849  PROBNP 104.80 37.0    CBG: No results for input(s): GLUCAP in the last 168 hours.     SignedChristiane Ha  [facility] 01/06/2015, 11:30 AM

## 2015-01-06 NOTE — Progress Notes (Signed)
To whom it may concern:   Meredith Diaz is unable to participate in cardiac rehab on 9/1-01/07/15, but may return 01/10/15.    Sincerely,    Crista Curb, MD [facility]

## 2015-01-07 ENCOUNTER — Encounter (HOSPITAL_COMMUNITY): Payer: Commercial Managed Care - PPO

## 2015-01-07 ENCOUNTER — Encounter (HOSPITAL_COMMUNITY): Admission: RE | Admit: 2015-01-07 | Payer: Commercial Managed Care - PPO | Source: Ambulatory Visit

## 2015-01-12 ENCOUNTER — Encounter (HOSPITAL_COMMUNITY)
Admission: RE | Admit: 2015-01-12 | Discharge: 2015-01-12 | Disposition: A | Discharge status: Home / Self Care | Payer: Commercial Managed Care - PPO | Source: Ambulatory Visit | Attending: Cardiology | Admitting: Cardiology

## 2015-01-12 ENCOUNTER — Encounter (HOSPITAL_COMMUNITY): Payer: Commercial Managed Care - PPO

## 2015-01-12 DIAGNOSIS — I509 Heart failure, unspecified: Secondary | ICD-10-CM | POA: Diagnosis not present

## 2015-01-12 NOTE — Progress Notes (Signed)
Pt returned to cardiac rehab today at the 6:45 class.  Pt tolerated light exercise with some difficulty from overall deconditioning and being absent for one weeek.  Pt wil see Dr. Christain [place] on tomorrow as follow up. Reminded pt that quality of life survey sent for possible anti-depressant therapy along with counseling support. Alanson Aly, BSN

## 2015-01-13 ENCOUNTER — Encounter: Payer: Self-pay | Admitting: Internal Medicine

## 2015-01-13 ENCOUNTER — Ambulatory Visit (INDEPENDENT_AMBULATORY_CARE_PROVIDER_SITE_OTHER): Payer: Commercial Managed Care - PPO | Admitting: Internal Medicine

## 2015-01-13 ENCOUNTER — Other Ambulatory Visit (INDEPENDENT_AMBULATORY_CARE_PROVIDER_SITE_OTHER): Payer: Commercial Managed Care - PPO

## 2015-01-13 VITALS — BP 140/88 | HR 93 | Temp 98.4°F | Resp 16 | Ht 60.0 in | Wt 202.4 lb

## 2015-01-13 DIAGNOSIS — E039 Hypothyroidism, unspecified: Secondary | ICD-10-CM

## 2015-01-13 DIAGNOSIS — F4323 Adjustment disorder with mixed anxiety and depressed mood: Secondary | ICD-10-CM

## 2015-01-13 DIAGNOSIS — I5042 Chronic combined systolic (congestive) and diastolic (congestive) heart failure: Secondary | ICD-10-CM

## 2015-01-13 DIAGNOSIS — R0789 Other chest pain: Secondary | ICD-10-CM

## 2015-01-13 DIAGNOSIS — I5022 Chronic systolic (congestive) heart failure: Secondary | ICD-10-CM

## 2015-01-13 LAB — CBC
HEMATOCRIT: 39.4 % (ref 36.0–46.0)
Hemoglobin: 13.3 g/dL (ref 12.0–15.0)
MCHC: 33.7 g/dL (ref 30.0–36.0)
MCV: 89.8 fl (ref 78.0–100.0)
Platelets: 386 10*3/uL (ref 150.0–400.0)
RBC: 4.39 Mil/uL (ref 3.87–5.11)
RDW: 13.2 % (ref 11.5–15.5)
WBC: 13.4 10*3/uL — AB (ref 4.0–10.5)

## 2015-01-13 LAB — COMPREHENSIVE METABOLIC PANEL
ALK PHOS: 96 U/L (ref 39–117)
ALT: 24 U/L (ref 0–35)
AST: 19 U/L (ref 0–37)
Albumin: 3.9 g/dL (ref 3.5–5.2)
BUN: 25 mg/dL — AB (ref 6–23)
CO2: 29 meq/L (ref 19–32)
Calcium: 9.5 mg/dL (ref 8.4–10.5)
Chloride: 101 mEq/L (ref 96–112)
Creatinine, Ser: 1.03 mg/dL (ref 0.40–1.20)
GFR: 64.84 mL/min (ref >60.00)
GLUCOSE: 92 mg/dL (ref 70–99)
POTASSIUM: 5 meq/L (ref 3.5–5.1)
SODIUM: 139 meq/L (ref 135–145)
TOTAL PROTEIN: 7.9 g/dL (ref 6.0–8.3)
Total Bilirubin: 0.4 mg/dL (ref 0.2–1.2)

## 2015-01-13 LAB — TSH: TSH: 7.86 u[IU]/mL — ABNORMAL HIGH (ref 0.35–4.50)

## 2015-01-13 LAB — T4, FREE: FREE T4: 1.01 ng/dL (ref 0.60–1.60)

## 2015-01-13 MED ORDER — DULOXETINE HCL 30 MG PO CPEP: 30.0000 mg | ORAL_CAPSULE | Freq: Every day | ORAL | Status: DC

## 2015-01-13 MED ORDER — CYCLOBENZAPRINE HCL 5 MG PO TABS: 5.0000 mg | ORAL_TABLET | Freq: Three times a day (TID) | ORAL | Status: DC | PRN

## 2015-01-13 NOTE — Assessment & Plan Note (Signed)
Checking CMP and CBC today. No signs of volume overload. Encouraged her to continue with cardiac rehab given her likely improved exercise tolerance. The goal is more mobility and endurance to help with her weight and fatigue.

## 2015-01-13 NOTE — Progress Notes (Signed)
Pre visit review using our clinic review tool, if applicable. No additional management support is needed unless otherwise documented below in the visit note. 

## 2015-01-13 NOTE — Assessment & Plan Note (Signed)
Recheck levels today with TSH and free T4 after dose adjustment (with increase at last visit). Adjust as needed.

## 2015-01-13 NOTE — Patient Instructions (Signed)
We have sent in cymbalta for the adjustment problems. Take 1 pill a day for the first 2 weeks. Most common side effects are some mild nausea and sometimes insomnia or sleepiness. After the 2 weeks call us and we will increase the dose to the full strength (you start at 30 mg daily then go up to 60 mg if doing well).   We have also sent in the flexeril which is the muscle medicine. You can take it 1 pill up to 3 times a day as needed. If you know you are going to do something to cause the pain and it helps the pain you can take it before.   I want you to come back in about 2 months so we can see how the cymbalta is doing.   We will check the labs today for the thyroid as well as for the heart doctor and will forward to results to Dr. Herbie [place].

## 2015-01-13 NOTE — Assessment & Plan Note (Signed)
Not likely to be coming from cardiac origin. Concern exists that she is having some musculoskeletal pain from increase in activity with cardiac rehab (timeline usually after exercising there). Will try flexeril and use cymbalta for her mood for the added bonus of some pain control.

## 2015-01-13 NOTE — Progress Notes (Signed)
   Subjective:    Patient ID: Meredith Diaz, female    DOB: [birth year], 34 y.o.   MRN: [id number]  HPI The patient is a 34 YO female coming in for hospital follow up (seen for chest pains, ruled out for MI, concern for new right sided heart failure with pulmonary hypertension). She has now been back to cardiac rehab since leaving the hospital and is doing okay. She is still getting the pain although now it is moving down to the upper abdomen and constricting her breathing. Mostly happens on days she has cardiac rehab or the next morning. Has tried hydrocodone from the hospital which was not helpful. Takes ibuprofen and mylanta which help after about 2 hours. The pain has caused her to have to leave work several times and is decreasing her QOL significantly.   Additionally she is concerned about depression and not dealing well with her many health problems. She was advised to talk to a counselor at the cardiac rehab but does not feel that this would be of benefit. Has struggled with depression in the past and has been on many medicines which mostly caused her to gain weight including: zoloft, paxil, lexapro (caused hallucinations). Willing to try something else. Not sleeping well. Not able to enjoy life.   Review of Systems  Constitutional: Positive for activity change. Negative for fever, appetite change, fatigue and unexpected weight change.  HENT: Negative.   Eyes: Negative.   Respiratory: Positive for chest tightness. Negative for cough, shortness of breath and wheezing.   Cardiovascular: Negative for chest pain, palpitations and leg swelling.  Gastrointestinal: Positive for abdominal pain. Negative for nausea, diarrhea, constipation, blood in stool and abdominal distention.  Musculoskeletal: Positive for myalgias, back pain and arthralgias. Negative for gait problem.  Neurological: Negative.   Psychiatric/Behavioral: Positive for sleep disturbance, dysphoric mood and decreased concentration.  Negative for suicidal ideas and self-injury. The patient is nervous/anxious.       Objective:   Physical Exam  Constitutional: She is oriented to person, place, and time. She appears well-developed and well-nourished. No distress.  Overweight  HENT:  Head: Normocephalic and atraumatic.  Eyes: EOM are normal.  Neck: Normal range of motion.  Cardiovascular: Normal rate and regular rhythm.   Pulmonary/Chest: Effort normal and breath sounds normal. No respiratory distress. She has no wheezes. She has no rales.  Abdominal: Soft. Bowel sounds are normal. She exhibits no distension and no mass. There is tenderness. There is no rebound and no guarding.  Tenderness in a band along the upper abdomen, no guarding or rebound.  Neurological: She is alert and oriented to person, place, and time. Coordination normal.  Skin: Skin is warm and dry.  Psychiatric:  Affect slightly flat but some anxiety present.   Filed Vitals:   01/13/15 0853  BP: 140/88  Pulse: 93  Temp: 98.4 F (36.9 C)  TempSrc: Oral  Resp: 16  Height: 5' (1.524 m)  Weight: 202 lb 6.4 oz (91.808 kg)  SpO2: 90%      Assessment & Plan:

## 2015-01-13 NOTE — Assessment & Plan Note (Signed)
Will add cymbalta 30 mg daily for 2 weeks then increase to 60 mg daily if no side effects. Discussed most common side effects. She was started on clonazepam and talked to her about the need to treat the underlying problem and not just the anxiety. She does not feel able to go to counseling at this time due to feeling overloaded by the amount of visits she is having.

## 2015-01-14 ENCOUNTER — Telehealth: Payer: Self-pay | Admitting: Pulmonary Disease

## 2015-01-14 ENCOUNTER — Encounter (HOSPITAL_COMMUNITY)
Admission: RE | Admit: 2015-01-14 | Discharge: 2015-01-14 | Disposition: A | Discharge status: Home / Self Care | Payer: Commercial Managed Care - PPO | Source: Ambulatory Visit | Attending: Cardiology | Admitting: Cardiology

## 2015-01-14 ENCOUNTER — Encounter (HOSPITAL_COMMUNITY): Payer: Commercial Managed Care - PPO

## 2015-01-14 DIAGNOSIS — G4733 Obstructive sleep apnea (adult) (pediatric): Secondary | ICD-10-CM

## 2015-01-14 DIAGNOSIS — Z9989 Dependence on other enabling machines and devices: Principal | ICD-10-CM

## 2015-01-14 DIAGNOSIS — I509 Heart failure, unspecified: Secondary | ICD-10-CM | POA: Diagnosis not present

## 2015-01-14 NOTE — Progress Notes (Signed)
Reviewed home exercise with pt today.  Pt plans to go to [facility] and do water aerobics for exercise, in addition to CRPII.  Reviewed THR, pulse, RPE, sign and symptoms, and when to call 911 or MD.  Pt voiced understanding.    [place], ACSM RCEP

## 2015-01-14 NOTE — Telephone Encounter (Signed)
Left message for [facility] with [employer] to call back

## 2015-01-14 NOTE — Telephone Encounter (Signed)
Called and spoke with Angelica Chessman from [place] She stated that she wanted to make sure ONO results were received and to make SN aware of multiple desats through night on test Informed Angelica Chessman that results are on SN cart for review Angelica Chessman requested to be contacted once SN had made decision about nocturnal O2  Informed that i would call office once orders were given Will keep in box to follow up on

## 2015-01-14 NOTE — Telephone Encounter (Signed)
Mandy returned call, can be reached at [phone].

## 2015-01-17 ENCOUNTER — Encounter (HOSPITAL_COMMUNITY)
Admission: RE | Admit: 2015-01-17 | Discharge: 2015-01-17 | Disposition: A | Discharge status: Home / Self Care | Payer: Commercial Managed Care - PPO | Source: Ambulatory Visit | Attending: Cardiology | Admitting: Cardiology

## 2015-01-17 ENCOUNTER — Encounter (HOSPITAL_COMMUNITY): Payer: Commercial Managed Care - PPO

## 2015-01-17 DIAGNOSIS — I509 Heart failure, unspecified: Secondary | ICD-10-CM | POA: Diagnosis not present

## 2015-01-17 NOTE — Progress Notes (Signed)
[name] 34 y.o. female Nutrition Note Spoke with pt. Nutrition Plan and Nutrition Survey goals reviewed with pt. Pt is following Step 2 of the Therapeutic Lifestyle Changes diet. Pt wants to lose wt. Pt has been trying to lose wt by "looking at serving sizes and measuring food." Wt loss tips reviewed. Gastric bypass surgery discussed. Pt stated "I didn't feel I was overweight enough to consider gastric bypass." Pt encouraged to think about and discuss gastric bypass with her PCP. Pt with dx of CHF. Per discussion, pt does not use canned/convenience foods often. Pt rarely adds salt to food. Pt eats out less frequently now. Pt expressed understanding of the information reviewed. Pt aware of nutrition education classes offered and plans on attending nutrition classes.  Lab Results  Component Value Date   HGBA1C 5.7 11/01/2014   Nutrition Diagnosis ? Food-and nutrition-related knowledge deficit related to lack of exposure to information as related to diagnosis of: ? CVD ? Pre-DM ? Obesity related to excessive energy intake as evidenced by a BMI of 40.8  Nutrition RX/ Estimated Daily Nutrition Needs for: wt loss 1200-1650 Kcal, 30-45 gm fat, 7-13 gm sat fat, 1.1-1.7 gm trans-fat, <1500 mg sodium, 150-175 gm CHO   Nutrition Intervention ? Pt's individual nutrition plan reviewed with pt. ? Benefits of adopting Therapeutic Lifestyle Changes discussed when Medficts reviewed. ? Pt to attend the Portion Distortion class ? Pt to attend the   ? Nutrition I class                     ? Nutrition II class  ? Pt given handouts for: ? Nutrition I class ? Nutrition II class  ? Continue client-centered nutrition education by RD, as part of interdisciplinary care. Goal(s) ? Pt to identify food quantities necessary to achieve: ? wt loss to a goal wt of 180-198 lb (81.6-89.8 kg) at graduation from cardiac rehab.  Monitor and Evaluate progress toward nutrition goal with team. Nutrition Risk: Change to  Moderate Mickle Plumb, M.Ed, RD, LDN, CDE 01/17/2015 9:46 AM

## 2015-01-18 NOTE — Telephone Encounter (Signed)
Per Dr. Kriste Basque, ONO shows that patient needs 2L bled into CPAP. Mandy notified. Order entered. Nothing further needed.

## 2015-01-19 ENCOUNTER — Encounter (HOSPITAL_COMMUNITY)
Admission: RE | Admit: 2015-01-19 | Discharge: 2015-01-19 | Disposition: A | Discharge status: Home / Self Care | Payer: Commercial Managed Care - PPO | Source: Ambulatory Visit | Attending: Cardiology | Admitting: Cardiology

## 2015-01-19 ENCOUNTER — Encounter (HOSPITAL_COMMUNITY): Payer: Commercial Managed Care - PPO

## 2015-01-19 ENCOUNTER — Telehealth: Payer: Self-pay | Admitting: Pulmonary Disease

## 2015-01-19 DIAGNOSIS — I509 Heart failure, unspecified: Secondary | ICD-10-CM | POA: Diagnosis not present

## 2015-01-19 NOTE — Telephone Encounter (Signed)
Pt returned call [phone]

## 2015-01-19 NOTE — Telephone Encounter (Signed)
Patient was concerned because no one advised her that she needed O2.  She received a call from [place] without knowing the results of the ONO report.   Went over results with Dr. Kriste Basque and discussed results with patient Per Dr. Kriste Basque:  Pts AHI: 51/hr; O2 was below 88%, 25% of the time and her lowest Sat was 63% Patient notified. Nothing further needed.

## 2015-01-19 NOTE — Telephone Encounter (Signed)
[email or web address]

## 2015-01-21 ENCOUNTER — Encounter (HOSPITAL_COMMUNITY): Payer: Commercial Managed Care - PPO

## 2015-01-21 ENCOUNTER — Encounter (HOSPITAL_COMMUNITY)
Admission: RE | Admit: 2015-01-21 | Discharge: 2015-01-21 | Disposition: A | Discharge status: Home / Self Care | Payer: Commercial Managed Care - PPO | Source: Ambulatory Visit | Attending: Cardiology | Admitting: Cardiology

## 2015-01-21 DIAGNOSIS — I509 Heart failure, unspecified: Secondary | ICD-10-CM | POA: Diagnosis not present

## 2015-01-24 ENCOUNTER — Encounter (HOSPITAL_COMMUNITY)
Admission: RE | Admit: 2015-01-24 | Discharge: 2015-01-24 | Disposition: A | Discharge status: Home / Self Care | Payer: Commercial Managed Care - PPO | Source: Ambulatory Visit | Attending: Cardiology | Admitting: Cardiology

## 2015-01-24 ENCOUNTER — Encounter (HOSPITAL_COMMUNITY): Payer: Commercial Managed Care - PPO

## 2015-01-24 DIAGNOSIS — I509 Heart failure, unspecified: Secondary | ICD-10-CM | POA: Diagnosis not present

## 2015-01-26 ENCOUNTER — Encounter (HOSPITAL_COMMUNITY): Payer: Commercial Managed Care - PPO

## 2015-01-26 ENCOUNTER — Encounter (HOSPITAL_COMMUNITY)
Admission: RE | Admit: 2015-01-26 | Discharge: 2015-01-26 | Disposition: A | Discharge status: Home / Self Care | Payer: Commercial Managed Care - PPO | Source: Ambulatory Visit | Attending: Cardiology | Admitting: Cardiology

## 2015-01-26 DIAGNOSIS — I509 Heart failure, unspecified: Secondary | ICD-10-CM | POA: Diagnosis not present

## 2015-01-27 ENCOUNTER — Encounter (HOSPITAL_COMMUNITY): Payer: Self-pay | Admitting: Emergency Medicine

## 2015-01-27 ENCOUNTER — Emergency Department (HOSPITAL_COMMUNITY)
Admission: EM | Admit: 2015-01-27 | Discharge: 2015-01-28 | Disposition: A | Discharge status: Home / Self Care | Payer: Commercial Managed Care - PPO | Attending: Emergency Medicine | Admitting: Emergency Medicine

## 2015-01-27 ENCOUNTER — Ambulatory Visit (INDEPENDENT_AMBULATORY_CARE_PROVIDER_SITE_OTHER): Payer: Commercial Managed Care - PPO | Admitting: Cardiology

## 2015-01-27 ENCOUNTER — Encounter: Payer: Self-pay | Admitting: Cardiology

## 2015-01-27 VITALS — BP 132/100 | HR 96 | Ht 60.0 in | Wt 193.1 lb

## 2015-01-27 DIAGNOSIS — I428 Other cardiomyopathies: Secondary | ICD-10-CM

## 2015-01-27 DIAGNOSIS — R42 Dizziness and giddiness: Secondary | ICD-10-CM | POA: Diagnosis present

## 2015-01-27 DIAGNOSIS — I429 Cardiomyopathy, unspecified: Secondary | ICD-10-CM | POA: Diagnosis not present

## 2015-01-27 DIAGNOSIS — E876 Hypokalemia: Secondary | ICD-10-CM | POA: Insufficient documentation

## 2015-01-27 DIAGNOSIS — H5500 Unspecified nystagmus: Secondary | ICD-10-CM | POA: Insufficient documentation

## 2015-01-27 DIAGNOSIS — R112 Nausea with vomiting, unspecified: Secondary | ICD-10-CM | POA: Insufficient documentation

## 2015-01-27 DIAGNOSIS — I10 Essential (primary) hypertension: Secondary | ICD-10-CM

## 2015-01-27 DIAGNOSIS — R110 Nausea: Secondary | ICD-10-CM

## 2015-01-27 DIAGNOSIS — R0789 Other chest pain: Secondary | ICD-10-CM

## 2015-01-27 DIAGNOSIS — I5042 Chronic combined systolic (congestive) and diastolic (congestive) heart failure: Secondary | ICD-10-CM | POA: Diagnosis not present

## 2015-01-27 DIAGNOSIS — H81399 Other peripheral vertigo, unspecified ear: Secondary | ICD-10-CM

## 2015-01-27 LAB — BASIC METABOLIC PANEL
ANION GAP: 14 (ref 5–15)
BUN: 24 mg/dL — ABNORMAL HIGH (ref 6–20)
CHLORIDE: 97 mmol/L — AB (ref 101–111)
CO2: 26 mmol/L (ref 22–32)
CREATININE: 1.11 mg/dL — AB (ref 0.44–1.00)
Calcium: 9.2 mg/dL (ref 8.9–10.3)
GFR calc non Af Amer: >60 mL/min (ref >60)
Glucose, Bld: 158 mg/dL — ABNORMAL HIGH (ref 65–99)
POTASSIUM: 3.2 mmol/L — AB (ref 3.5–5.1)
SODIUM: 137 mmol/L (ref 135–145)

## 2015-01-27 LAB — I-STAT BETA HCG BLOOD, ED (MC, WL, AP ONLY): I-stat hCG, quantitative: <5 m[IU]/mL (ref <5)

## 2015-01-27 LAB — CBC
HEMATOCRIT: 41.2 % (ref 36.0–46.0)
HEMOGLOBIN: 14.3 g/dL (ref 12.0–15.0)
MCH: 30.8 pg (ref 26.0–34.0)
MCHC: 34.7 g/dL (ref 30.0–36.0)
MCV: 88.8 fL (ref 78.0–100.0)
PLATELETS: 405 10*3/uL — AB (ref 150–400)
RBC: 4.64 MIL/uL (ref 3.87–5.11)
RDW: 12.9 % (ref 11.5–15.5)
WBC: 13.7 10*3/uL — AB (ref 4.0–10.5)

## 2015-01-27 LAB — CBG MONITORING, ED: Glucose-Capillary: 155 mg/dL — ABNORMAL HIGH (ref 65–99)

## 2015-01-27 LAB — I-STAT CG4 LACTIC ACID, ED: LACTIC ACID, VENOUS: 1.57 mmol/L (ref 0.5–2.0)

## 2015-01-27 MED ORDER — METOPROLOL SUCCINATE ER 50 MG PO TB24
50.0000 mg | ORAL_TABLET | Freq: Every day | ORAL | Status: DC
Start: 2015-01-27 — End: 2015-01-28

## 2015-01-27 MED ORDER — MECLIZINE HCL 25 MG PO TABS: ORAL_TABLET | ORAL | Status: DC

## 2015-01-27 MED ORDER — MECLIZINE HCL 25 MG PO TABS
50.0000 mg | ORAL_TABLET | Freq: Once | ORAL | Status: AC
  Administered 2015-01-27: 50 mg via ORAL
  Filled 2015-01-27: qty 2

## 2015-01-27 MED ORDER — HYDRALAZINE HCL 50 MG PO TABS: ORAL_TABLET | ORAL | Status: DC

## 2015-01-27 MED ORDER — DIAZEPAM 5 MG/ML IJ SOLN
2.5000 mg | Freq: Once | INTRAMUSCULAR | Status: AC
  Administered 2015-01-27: 2.5 mg via INTRAVENOUS
  Filled 2015-01-27: qty 2

## 2015-01-27 MED ORDER — SODIUM CHLORIDE 0.9 % IV BOLUS (SEPSIS)
500.0000 mL | Freq: Once | INTRAVENOUS | Status: AC
  Administered 2015-01-27: 500 mL via INTRAVENOUS

## 2015-01-27 NOTE — Progress Notes (Signed)
PCP: Judie Bonus, MD  Clinic Note: Chief Complaint  Patient presents with  . Chest Pain    Post Hosp Visit  . Dizziness  . Congestive Heart Failure    Chronic combined systolic and diastolic failure    HPI: Meredith Diaz is a 34 y.o. female with a PMH below who presents today for "post-hospital visit". Meredith Diaz has moderate LV dysfunction with EF of 40 and 45% with grade 2 diastolic dysfunction by recent echo in July. Echocardiogram July 14: LV dysfunction with an EF of 40-45%. now grade 2 diastolic dysfunction.Meredith Diaz was last seen on 7/29 by Mr. Brion Aliment -> she was feeling somewhat better we'll await down to 201 pounds. He mentioned that the preceding week he heard Lasix dose had been reduced as she was dry and orthostatic with mild prerenal azotemia. However that she go back correlate with worsening dyspnea, so her Lasix was increased back to 60 mg in the morning and 40 at night with additional Zaroxolyn dosing. She noted that she didn't have as good responsiveness to Lasix, therefore she was switched over to torsemide.  She has had her GXT that essentially showed poor exercise tolerance with hypertensive response but no ischemia.  She has started Cardiac Rehab for chronic CHF.  Recent Hospitalizations: 01/05/2015 -- thought to be related to HF. She had a CTA of the chest that was negative. Cardiology consult team recommended adding Spironolactone.   Studies Reviewed: GXT 12/2014: negative.  Poor exercise tolerance. HTN response   Interval History: She does seem to have responded better to the torsemide versus furosemide with better diuresis effect.. Today, her most notable complaint is dizziness - that really sound vertiginous in nature. It is described as a spinning sensation on and made worse with head movements or positioning. It is not necessarily orthostatic type symptoms. Certainly when she is more dehydrated, this is an exacerbating factor.  She seems to be  somewhat euvolemic clinically based on symptoms. She has chronic orthopnea at baseline with exertion but is extremely deconditioned as was noted on her GXT. She had a significant hypertensive response were GXT as well. She may very well have some microvascular ischemia, but unlikely to have microvascular ischemia. Her edema really has never been an issue -  With volume overload usually manifesting asabdominal bloating. She has intermittent episodes of chest discomfort which is really more upper chest and radiating to the back and left axilla.  She has not had any syncope or near syncope symptoms. No TIA or amaurosis fugax symptoms. No rapid irregular heartbeats or palpitations just occasional rare flip-flops. She brings me her blood pressure readings from cardiac rehabilitation were most of her blood pressures are ranging from the 110-140 mmHg range and diastolic pressures in the broad range of 60s to 90s. She says she enjoys doing cardiac rehabilitation and feels like she is getting better, but it is difficult to figure out a regimen for her because of her deconditioning and extreme dyspnea.   Past Medical History  Diagnosis Date  . Chronic combined systolic and diastolic CHF (congestive heart failure) 07/2011    a. Prior Echo: EF was 25-30%; b. No Sig CAD on CATH; c. No significant finding on Cardiac MRI; d. follow-up Echo 2014 --  EF of 50%;  e. 03/2014 Echo: EF 40-45%, gr 1 DD; f. 11/2014 Echo: EF 40-45%, Gr 2 DD.  [name] Asthma   . Thyroid disease   . Depression   . Anxiety   .  Stomach problems   . Essential hypertension   . Congenital heart disease, adult     a. 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  . Nonischemic cardiomyopathy     a. unclear etiology;  b. 01/2012 Cardiac MRI: no infiltrative pathology, EF 52%;  b. 11/2014 Echo: EF 40-45%, gr 2 DD.  . Morbid obesity     Past  Surgical History  Procedure Laterality Date  . Cholecystectomy  2013  . Cardiac catheterization  08/2011    Normal Coronaries - LVEDP 32 mmHg  . Transthoracic echocardiogram  07/2011    Moderate concentric LVH, mildly dilated. EF 25-30% no diastolic dysfunction parameters to suggest elevated LAP  . Cardiac mri  April 2013    No suggestion of a congenital abnormality; EF ~ 52% - no regional wall motion abnormalities., no increased myocardial signal intensity. No perfusion abnormality.  . 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 Normal wall thickness (compared to prior reading of possible apical LVH), grade 2 DD -- with elevated LV filling pressures (however left atrium was read as normal size), normal artery function and size. No no suggestion of pulmonary hypertension   ROS: A comprehensive was performed. Review of Systems  Constitutional: Negative for weight loss and malaise/fatigue (Mostly from deconditioning.).  HENT: Negative for nosebleeds.   Gastrointestinal: Negative for blood in stool and melena.  Genitourinary: Negative for hematuria.  Musculoskeletal: Positive for back pain. Negative for falls.  Neurological: Positive for dizziness. Negative for tremors and weakness.  Endo/Heme/Allergies: Does not bruise/bleed easily.  Psychiatric/Behavioral: The patient is nervous/anxious.   All other systems reviewed and are negative.   Prior to Admission medications   Medication Sig Start Date End Date Taking? Authorizing Provider  albuterol (PROVENTIL HFA;VENTOLIN HFA) 108 (90 BASE) MCG/ACT inhaler Inhale 2 puffs into the lungs as needed for wheezing or shortness of breath.    Historical Provider, MD  budesonide-formoterol (SYMBICORT) 160-4.5 MCG/ACT inhaler Inhale 2 puffs into the lungs daily. 01/28/14   Judie Bonus, MD  calcium carbonate (TUMS -  DOSED IN MG ELEMENTAL CALCIUM) 500 MG chewable tablet Chew 1 tablet by mouth daily as needed for indigestion or heartburn.    Historical Provider, MD  cholestyramine light (PREVALITE) 4 GM/DOSE powder Take 4 g by mouth as needed (DIARRHEA DUE TO BILE ACIDS).     Historical Provider, MD  clobetasol cream (TEMOVATE) 0.05 % Apply 1 application topically as needed (for Sclerosis).     Historical Provider, MD  clonazePAM (KLONOPIN) 1 MG tablet Take 1 tablet (1 mg total) by mouth 2 (two) times daily. Patient taking differently: Take 1 mg by mouth 2 (two) times daily. Pt takes 0.5mg  in morning and 1.5mg  at night b/c it makes her groggy. 08/26/14   Michele Mcalpine, MD  cyclobenzaprine (FLEXERIL) 5 MG tablet Take 1 tablet (5 mg total) by mouth 3 (three) times daily as needed for muscle spasms. 01/13/15   Judie Bonus, MD  dicyclomine (BENTYL) 20 MG tablet Take 1 tablet (20 mg total) by mouth as needed for spasms. Patient taking differently: Take 20 mg by mouth daily as needed for spasms.  01/28/14   Judie Bonus, MD  DULoxetine (CYMBALTA) 30 MG capsule Take 1 capsule (30 mg total) by  mouth daily. 01/13/15   Judie Bonus, MD  esomeprazole (NEXIUM) 40 MG packet Take 40 mg by mouth daily before breakfast. 01/06/15   Christiane Ha, MD  hydrALAZINE (APRESOLINE) 25 MG tablet Take 25 mg by mouth 3 (three) times daily.    Historical Provider, MD  ibuprofen (ADVIL,MOTRIN) 200 MG tablet Take 400 mg by mouth every 6 (six) hours as needed for moderate pain.    Historical Provider, MD  levothyroxine (SYNTHROID, LEVOTHROID) 75 MCG tablet Take 1 tablet (75 mcg total) by mouth daily. 11/01/14   Judie Bonus, MD  metolazone (ZAROXOLYN) 2.5 MG tablet Take 2.5 mg by mouth as needed (swelling).     Historical Provider, MD  metoprolol succinate (TOPROL XL) 25 MG 24 hr tablet Take 1 tablet (25 mg total) by mouth daily. 03/08/14   Marykay Lex, MD  norethindrone-ethinyl estradiol 1/35 (ALAYCEN 1/35) tablet Take 1  tablet by mouth daily. 01/28/14   Judie Bonus, MD  potassium chloride SA (K-DUR,KLOR-CON) 20 MEQ tablet Take 40 mEq by mouth 2 (two) times daily.    Historical Provider, MD  spironolactone (ALDACTONE) 25 MG tablet Take 0.5 tablets (12.5 mg total) by mouth daily. 01/06/15   Christiane Ha, MD  torsemide (DEMADEX) 20 MG tablet Take 1 tablet (20 mg total) by mouth daily. Patient taking differently: Take 40 mg by mouth daily.  12/09/14   Ok Anis, NP  vitamin B-12 (CYANOCOBALAMIN) 1000 MCG tablet Take 1 tablet (1,000 mcg total) by mouth daily. 04/13/14   Judie Bonus, MD   Allergies  Allergen Reactions  . Lisinopril Swelling    Angioedema facial, mostly just lips, no trouble breathing    Social History   Social History  . Marital Status: Married    Spouse Name: N/A  . Number of Children: N/A  . Years of Education: N/A   Occupational History  . editor    Social History Main Topics  . Smoking status: Never Smoker   . Smokeless tobacco: None  . Alcohol Use: No  . Drug Use: No  . Sexual Activity: Not Asked   Other Topics Concern  . None   Social History Narrative   Family History  Problem Relation Age of Onset  . Depression Mother   . Hypertension Father   . Heart disease Maternal Grandfather   . Heart disease Paternal Grandmother   . Breast cancer Paternal Grandmother   . Colon cancer Maternal Grandmother   . Asthma Paternal Grandmother   . Heart attack Maternal Grandfather   . Heart attack Paternal Grandmother   . Heart attack Paternal Grandfather   . Stroke Neg Hx     Wt Readings from Last 3 Encounters:  01/27/15 193 lb 1 oz (87.573 kg)  01/13/15 202 lb 6.4 oz (91.808 kg)  01/06/15 199 lb 12.8 oz (90.629 kg)    PHYSICAL EXAM BP 132/100 mmHg  Pulse 96  Ht 5' (1.524 m)  Wt 193 lb 1 oz (87.573 kg)  BMI 37.71 kg/m2  LMP 01/05/2015 (Exact Date) General appearance: alert, cooperative, appears stated age, no distress and moderately obese;  well-groomed HEENT: [place]/AT, EOMI, MMM, anicteric sclera Neck: no adenopathy, no carotid bruit and no JVD Lungs: CTAB, normal percussion bilaterally and non-labored Heart:RRR, S1& S2 normal, no M/R/G, nondisplaced PMI. Abdomen: soft, non-tender; bowel sounds normal; no masses,  no organomegaly; obese. No HJR Extremities: extremities normal, atraumatic; no cyanosis 4edema Pulses: 2+ and symmetric;  Skin: mobility and turgor normal Neurologic: Mental  status: Alert, oriented, thought content appropriate Cranial nerves: normal (II-XII grossly intact)    Adult ECG Report  not performed   Other studies Reviewed: Additional studies/ records that were reviewed today include:  Recent Labs:   From September 8: Creatinine 1.03. Potassium 5.0. LFTs normal. Glucose 92. TSH mildly elevated but free T4 normal   ASSESSMENT / PLAN: Problem List Items Addressed This Visit    Atypical chest pain    She has lost a little chest twinging-type symptoms. These don't really see him in July in nature. She did relatively well on her GXT besides showing excised intolerance and deconditioning. I actually think, after she completes cardiac rehabilitation, the best evaluation would be a cardiopulmonary exercise test. This will give [place] a better overall heart failure assessment. Depending on how she would do on that, I would potentially consider referral to our clinic for assistance in management.      Chronic combined systolic and diastolic heart failure, NYHA class 3 - Primary (Chronic)    BNP from September 8 was 18.8 which would suggest she was completely euvolemic. He does seem to respond better to torsemide than Lasix. She also uses when necessary Zaroxolyn for were swelling days.  I talked her about daily weights and adjusting her diuresis BiPAP. She continues to have elevated diastolic pressure today and had a hypertensive response to exercise.   Plan: diastolic pressure of 100 today. Hypertensive response on  the GXT suggesting need for additional afterload reduction.  convert hydralazine to twice a day and increase total dose to 50 mg twice a day from 25mg  3 times a day.  Heart rate remains elevated at baseline today. I'll increase her metoprolol gradually to 50 mg. Starting tomorrow Convert to PM dosing.  If she doesn't tolerate increased dose of beta blocker because of blood pressure or fatigue, I would consider using Corlaner for additional rate control.  At present, I don't see any acute need for invasive evaluation, but if she continues to be difficult to manage from a volume standpoint, we may need to consider right heart catheterization to better assess pulmonary pressures and left-sided filling pressures.  We'll need to closely monitor her potassium levels now that she is on spironolactone plus supplement. Will need close followup       Relevant Medications   hydrALAZINE (APRESOLINE) 50 MG tablet   Dizziness (Chronic)    Really hard to tell what her dizziness stem stroke. I don't think it's orthostatic based on her description. It sounds more like vertigo. I gave her a prescription for when necessary Antivert. Symptoms do not really sound likely are related to an arrhythmia either., Would consider evaluation by neurology       Essential hypertension (Chronic)    We have increased her hydralazine 25 3 times a day. I will not change to 50 twice a day. I thought about the possibility of ideal, but apparently she did not do well with the nitrate portion. She also has had angioedema from ACE inhibitors, but I remain somewhat concerned about using an ARB..   Plan: Convert to 50 mg twice a day of hydralazine. Also increase her beta blocker as described in chronic combined heart failure section.      Relevant Medications   hydrALAZINE (APRESOLINE) 50 MG tablet   Morbid obesity    She is starting to work with cardiac rehabilitation. There have been hard time getting out a regimen for her to  do. I still think any moderate exercise with  a consistent dosing levels is the safest option.      NICM (nonischemic cardiomyopathy)    Continue to support her efforts at cardiac rehabilitation. She got he has significant deconditioning, but I think overall for her to become more active with building up her exercise tolerance, hopefully once she is able to become more stable but with exercise, she will then go to work on weight loss with dietary modification and exercise.      Relevant Medications   hydrALAZINE (APRESOLINE) 50 MG tablet      Current medicines are reviewed at length with the patient today. (+/- concerns) potassium dosing The following changes have been made:  Increase Hydralazine to 50 mg twice a day Increase Toprol to 50 mg daily    ( Take 25 mg tonight and then 9/23 take 50 mg daily )  Take Antivert 25 mg when dizziness starts then may take 25 mg 12 hours later if needed then take 25 mg daily for 3 days    Your physician recommends that you schedule a follow-up appointment in: December 04/12/15 at 9:30 am  Studies Ordered:   No orders of the defined types were placed in this encounter.      Marykay Lex, M.D., M.S. Interventional Cardiologist   Pager # [phone]

## 2015-01-27 NOTE — ED Notes (Addendum)
Pt c/o continued dizziness with improvement in nausea. NAD.  Placed pt on 3L oxygen via nasal cannula due to oxygen saturation at 80% on room air.  O2 immediately increased to 100% so RN reduced flow rate to 2L/min per pt baseline at home when sleeping.  Oxygen saturation remains at 100% at this time.

## 2015-01-27 NOTE — ED Provider Notes (Signed)
CSN: [id number]     Arrival date & time 01/27/15  2128 History   First MD Initiated Contact with Patient 01/27/15 2143     Chief Complaint  Patient presents with  . Dizziness  . Nausea  . Emesis   Meredith Diaz is a 34 y.o. female with a history of chronic empyema and systolic and diastolic CHF with her most recent echo EF of 40-45%, obesity, hypertension and nonischemic cardiomyopathy who presents to the ED complaining of vertigo since yesterday. The patient reports room spinning dizziness starting yesterday and has become progressively worse today. She also complains of a right sided headache that she rates at a 7/10. The patient reports she has a history of vertigo in 2013 and reports this feels similar. The patient denies any new abdominal pain or shortness of breath. The patient reports to me she had a CT angiogram of her chest on 01/05/15 which was negative for PE. The patient reports taking 25 mg of meclizine at 3 PM today without relief. She reports intermittent ear nose and throat doctor in 2013 with her vertigo who told her she has a vestibular defect. The patient reports she saw her cardiologist Dr. Herbie Baltimore today to provider her with a prescription for the meclizine. The patient denies fevers, recent illness, new abdominal pain, double vision, chest pain, urinary symptoms, tinnitus, ear pain, neck pain, numbness, tingling, weakness.   (Consider location/radiation/quality/duration/timing/severity/associated sxs/prior Treatment) HPI  Past Medical History  Diagnosis Date  . Chronic combined systolic and diastolic CHF (congestive heart failure) 07/2011    a. Prior Echo: EF was 25-30%; b. No Sig CAD on CATH; c. No significant finding on Cardiac MRI; d. follow-up Echo 2014 --  EF of 50%;  e. 03/2014 Echo: EF 40-45%, gr 1 DD; f. 11/2014 Echo: EF 40-45%, Gr 2 DD.  [name] Asthma   . Thyroid disease   . Depression   . Anxiety   . Stomach problems   . Essential hypertension   . Congenital heart  disease, adult     a. 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  . Nonischemic cardiomyopathy     a. unclear etiology;  b. 01/2012 Cardiac MRI: no infiltrative pathology, EF 52%;  b. 11/2014 Echo: EF 40-45%, gr 2 DD.  . Morbid obesity    Past Surgical History  Procedure Laterality Date  . Cholecystectomy  2013  . Cardiac catheterization  08/2011    Normal Coronaries - LVEDP 32 mmHg  . Transthoracic echocardiogram  07/2011    Moderate concentric LVH, mildly dilated. EF 25-30% no diastolic dysfunction parameters to suggest elevated LAP  . Cardiac mri  April 2013    No suggestion of a congenital abnormality; EF ~ 52% - no regional wall motion abnormalities., no increased myocardial signal intensity. No perfusion abnormality.  . Transthoracic echocardiogram  December 2015    Last EF by echo 40-45% with global hypokinesis, mild LVH possible apical variant hypertrophic cardiomyopathy    Family History  Problem Relation Age of Onset  . Depression Mother   . Hypertension Father   . Heart disease Maternal Grandfather   . Heart disease Paternal Grandmother   . Breast cancer Paternal Grandmother   . Colon cancer Maternal Grandmother   . Asthma Paternal Grandmother   . Heart attack Maternal Grandfather   . Heart attack Paternal Grandmother   . Heart attack Paternal Grandfather   .  Stroke Neg Hx    Social History  Substance Use Topics  . Smoking status: Never Smoker   . Smokeless tobacco: None  . Alcohol Use: No   OB History    No data available     Review of Systems  Constitutional: Negative for fever and chills.  HENT: Negative for congestion, ear discharge, ear pain, hearing loss and sore throat.   Eyes: Positive for visual disturbance ("spinning vision" ). Negative for photophobia and pain.  Respiratory: Negative for cough and wheezing.   Cardiovascular: Negative for  chest pain and palpitations.  Gastrointestinal: Positive for nausea and vomiting. Negative for abdominal pain, diarrhea and blood in stool.  Genitourinary: Negative for dysuria.  Musculoskeletal: Negative for back pain, neck pain and neck stiffness.  Neurological: Positive for dizziness and headaches. Negative for syncope, speech difficulty, weakness and numbness.      Allergies  Lisinopril  Home Medications   Prior to Admission medications   Medication Sig Start Date End Date Taking? Authorizing Provider  albuterol (PROVENTIL HFA;VENTOLIN HFA) 108 (90 BASE) MCG/ACT inhaler Inhale 2 puffs into the lungs as needed for wheezing or shortness of breath.   Yes Historical Provider, MD  budesonide-formoterol (SYMBICORT) 160-4.5 MCG/ACT inhaler Inhale 2 puffs into the lungs daily. 01/28/14  Yes Judie Bonus, MD  calcium carbonate (TUMS - DOSED IN MG ELEMENTAL CALCIUM) 500 MG chewable tablet Chew 1 tablet by mouth daily as needed for indigestion or heartburn.   Yes Historical Provider, MD  cholestyramine light (PREVALITE) 4 GM/DOSE powder Take 4 g by mouth as needed (DIARRHEA DUE TO BILE ACIDS).    Yes Historical Provider, MD  clobetasol cream (TEMOVATE) 0.05 % Apply 1 application topically as needed (for Sclerosis).    Yes Historical Provider, MD  clonazePAM (KLONOPIN) 1 MG tablet Take 1 tablet (1 mg total) by mouth 2 (two) times daily. Patient taking differently: Take 0.5-1.5 mg by mouth 2 (two) times daily. Pt takes 0.5mg  in morning and 1.5mg  at night b/c it makes her groggy. 08/26/14  Yes Michele Mcalpine, MD  cyclobenzaprine (FLEXERIL) 5 MG tablet Take 1 tablet (5 mg total) by mouth 3 (three) times daily as needed for muscle spasms. 01/13/15  Yes Judie Bonus, MD  dicyclomine (BENTYL) 20 MG tablet Take 1 tablet (20 mg total) by mouth as needed for spasms. Patient taking differently: Take 20 mg by mouth daily as needed for spasms.  01/28/14  Yes Judie Bonus, MD  DULoxetine (CYMBALTA)  30 MG capsule Take 1 capsule (30 mg total) by mouth daily. 01/13/15  Yes Judie Bonus, MD  esomeprazole (NEXIUM) 40 MG packet Take 40 mg by mouth daily before breakfast. 01/06/15  Yes Christiane Ha, MD  hydrALAZINE (APRESOLINE) 50 MG tablet Take 50 mg twice a day Patient taking differently: Take 50 mg by mouth 2 (two) times daily.  01/27/15  Yes Marykay Lex, MD  ibuprofen (ADVIL,MOTRIN) 200 MG tablet Take 400 mg by mouth every 6 (six) hours as needed for moderate pain.   Yes Historical Provider, MD  levothyroxine (SYNTHROID, LEVOTHROID) 75 MCG tablet Take 1 tablet (75 mcg total) by mouth daily. 11/01/14  Yes Judie Bonus, MD  meclizine (ANTIVERT) 25 MG tablet Take 25 mg when dizziness starts then may take 25 mg 12 hours later if needed then take 25 mg daily for 3 days only Patient taking differently: Take 25 mg by mouth 2 (two) times daily as needed for dizziness or nausea. Take 25 mg  when dizziness starts then may take 25 mg 12 hours later if needed then take 25 mg daily for 3 days only 01/27/15  Yes Marykay Lex, MD  metolazone (ZAROXOLYN) 2.5 MG tablet Take 2.5 mg by mouth as needed (swelling).    Yes Historical Provider, MD  metoprolol succinate (TOPROL-XL) 25 MG 24 hr tablet Take 25 mg by mouth daily. 01/05/15  Yes Historical Provider, MD  norethindrone-ethinyl estradiol 1/35 (ALAYCEN 1/35) tablet Take 1 tablet by mouth daily. 01/28/14  Yes Judie Bonus, MD  potassium chloride SA (K-DUR,KLOR-CON) 20 MEQ tablet Take 20 mEq by mouth daily.   Yes Historical Provider, MD  spironolactone (ALDACTONE) 25 MG tablet Take 0.5 tablets (12.5 mg total) by mouth daily. 01/06/15  Yes Christiane Ha, MD  torsemide (DEMADEX) 20 MG tablet Take 1 tablet (20 mg total) by mouth daily. 12/09/14  Yes Ok Anis, NP  vitamin B-12 (CYANOCOBALAMIN) 1000 MCG tablet Take 1 tablet (1,000 mcg total) by mouth daily. 04/13/14  Yes Judie Bonus, MD  metoprolol succinate (TOPROL XL) 50 MG 24 hr  tablet Take 1 tablet (50 mg total) by mouth daily. Take with or immediately following a meal. Patient not taking: Reported on 01/27/2015 01/27/15   Marykay Lex, MD   BP 132/79 mmHg  Pulse 80  Temp(Src) 98.6 F (37 C) (Oral)  Resp 23  SpO2 100%  LMP 01/05/2015 (Exact Date) Physical Exam  Constitutional: She is oriented to person, place, and time. She appears well-developed and well-nourished. No distress.  Non-toxic appearing.   HENT:  Head: Normocephalic and atraumatic.  Right Ear: External ear normal.  Left Ear: External ear normal.  Mouth/Throat: Oropharynx is clear and moist. No oropharyngeal exudate.  Bilateral tympanic membranes are pearly-gray without erythema or loss of landmarks. No temporal edema or tenderness.   Eyes: Conjunctivae are normal. Pupils are equal, round, and reactive to light. Right eye exhibits no discharge. Left eye exhibits no discharge. Right eye exhibits nystagmus. Left eye exhibits nystagmus.  Initially patient will not hold open eyelids for detailed EOM exam. Later the patient will hold open her eyes and appears to have horizontal nystagmus in both directions.   Neck: Normal range of motion. Neck supple. No JVD present.  Cardiovascular: Normal rate, regular rhythm and intact distal pulses.   Bilateral radial, and posterior tibialis pulses are intact.    Pulmonary/Chest: Effort normal and breath sounds normal. No respiratory distress. She has no wheezes. She has no rales.  Abdominal: Soft. There is no tenderness. There is no guarding.  Musculoskeletal: Normal range of motion. She exhibits no edema or tenderness.  No lower extremity edema or tenderness. Patient is spontaneously moving all extremities in a coordinated fashion exhibiting good strength. Good and equal grip strengths bilaterally.   Lymphadenopathy:    She has no cervical adenopathy.  Neurological: She is alert and oriented to person, place, and time. No cranial nerve deficit. Coordination  normal.  The patient is alert and oriented 3. Cranial nerves are intact. Sensation is intact to her bilateral upper and lower extremities. No pronator drift. Finger to nose intact bilaterally.   Skin: Skin is warm and dry. No rash noted. She is not diaphoretic. No erythema. No pallor.  Psychiatric: She has a normal mood and affect. Her behavior is normal.  Nursing note and vitals reviewed.   ED Course  Procedures (including critical care time) Labs Review Labs Reviewed  BASIC METABOLIC PANEL - Abnormal; Notable for the following:  Potassium 3.2 (*)    Chloride 97 (*)    Glucose, Bld 158 (*)    BUN 24 (*)    Creatinine, Ser 1.11 (*)    All other components within normal limits  CBC - Abnormal; Notable for the following:    WBC 13.7 (*)    Platelets 405 (*)    All other components within normal limits  CBG MONITORING, ED - Abnormal; Notable for the following:    Glucose-Capillary 155 (*)    All other components within normal limits  URINALYSIS, ROUTINE W REFLEX MICROSCOPIC (NOT AT [facility])  I-STAT BETA HCG BLOOD, ED ([facility], [facility], [facility] ONLY)  I-STAT CG4 LACTIC ACID, ED    Imaging Review No results found.    EKG Interpretation None      Filed Vitals:   01/27/15 2200 01/27/15 2230 01/27/15 2300 01/27/15 2319  BP: 137/88 133/87 132/79   Pulse: 78 77 80   Temp:      TempSrc:      Resp: SpO2: 93% 95% 89% 100%     MDM   Meds given in ED:  Medications  diazepam (VALIUM) injection 2.5 mg (not administered)  sodium chloride 0.9 % bolus 500 mL (500 mLs Intravenous New Bag/Given 01/27/15 2234)  meclizine (ANTIVERT) tablet 50 mg (50 mg Oral Given 01/27/15 2234)  diazepam (VALIUM) injection 2.5 mg (2.5 mg Intravenous Given 01/27/15 2316)    New Prescriptions   No medications on file    Final diagnoses:  Dizziness  Nausea  Nystagmus   This is a 34 y.o. female with a history of chronic empyema and systolic and diastolic CHF with her most recent echo EF of 40-45%,  obesity, hypertension and nonischemic cardiomyopathy who presents to the ED complaining of vertigo since yesterday. The patient reports room spinning dizziness starting yesterday and has become progressively worse today. She also complains of a right sided headache that she rates at a 7/10. The patient reports she has a history of vertigo in 2013 and reports this feels similar. The patient saw her cardiologist Dr. Herbie Baltimore earlier today who gave her a prescription for meclizine. She reports taking meclizine on 3 PM today without relief.  On exam the patient is afebrile nontoxic appearing. Initially the patient is unwilling to keep her eyes open for detailed exam of her EOMs. At repeat evaluation the patient has right and left sided nystagmus but otherwise no focal neurological deficits. Patient has received 50 mg of meclizine, half a liter fluid bolus and 2.5 mg of Valium. She reports her nausea has resolved however she still feels room spinning dizziness. Will transfer this patient to [facility] for MRI to rule out cental cause of her vertigo.  I spoke with Dr. Preston Fleeting who accepts the patient in transfer.  The patient agrees to transfer and MRI. Will transfer by Jac[facility] Loge.   This patient was discussed with Dr. Adela Lank who agrees with assessment and plan.   Everlene Farrier, PA-C 01/28/15 0038  Melene Plan, DO 01/28/15 1535

## 2015-01-27 NOTE — ED Notes (Signed)
Pt presents from home via EMS c/o dizzines x 2 days and nausea/vomiting today.  She visited her cardiologist this morning and was prescribed meclizine but has experienced no improvement in symptoms.  She has vomited at least 4 times today including dry heaves.  Husband at bedside.  EMS reports NSR on EKG en route, vitals WNL. 20g IV in left hand with 4mg  Zofran administered en route.

## 2015-01-27 NOTE — Patient Instructions (Signed)
Increase Hydralazine to 50 mg twice a day   Increase Toprol to 50 mg daily    ( Take 25 mg tonight and then 9/23 take 50 mg daily )    Take Antivert 25 mg when dizziness starts then may take 25 mg 12 hours later if needed then take 25 mg daily for 3 days    Your physician recommends that you schedule a follow-up appointment in: December 04/12/15 at 9:30 am

## 2015-01-28 ENCOUNTER — Emergency Department (HOSPITAL_COMMUNITY): Payer: Commercial Managed Care - PPO

## 2015-01-28 ENCOUNTER — Encounter (HOSPITAL_COMMUNITY): Payer: Commercial Managed Care - PPO

## 2015-01-28 LAB — URINALYSIS, ROUTINE W REFLEX MICROSCOPIC
GLUCOSE, UA: NEGATIVE mg/dL
HGB URINE DIPSTICK: NEGATIVE
KETONES UR: 15 mg/dL — AB
Leukocytes, UA: NEGATIVE
NITRITE: NEGATIVE
PH: 5 (ref 5.0–8.0)
Protein, ur: 100 mg/dL — AB
Specific Gravity, Urine: 1.023 (ref 1.005–1.030)
Urobilinogen, UA: 1 mg/dL (ref 0.0–1.0)

## 2015-01-28 LAB — URINE MICROSCOPIC-ADD ON

## 2015-01-28 LAB — I-STAT CG4 LACTIC ACID, ED: Lactic Acid, Venous: 0.83 mmol/L (ref 0.5–2.0)

## 2015-01-28 MED ORDER — MECLIZINE HCL 25 MG PO TABS: 25.0000 mg | ORAL_TABLET | Freq: Three times a day (TID) | ORAL | Status: DC | PRN

## 2015-01-28 MED ORDER — DIAZEPAM 5 MG/ML IJ SOLN
2.5000 mg | Freq: Once | INTRAMUSCULAR | Status: AC
  Administered 2015-01-28: 2.5 mg via INTRAVENOUS
  Filled 2015-01-28: qty 2

## 2015-01-28 MED ORDER — LORAZEPAM 1 MG PO TABS: 1.0000 mg | ORAL_TABLET | Freq: Three times a day (TID) | ORAL | Status: DC | PRN

## 2015-01-28 MED ORDER — SODIUM CHLORIDE 0.9 % IV SOLN
1000.0000 mL | INTRAVENOUS | Status: DC
  Administered 2015-01-28: 1000 mL via INTRAVENOUS

## 2015-01-28 MED ORDER — POTASSIUM CHLORIDE CRYS ER 20 MEQ PO TBCR
40.0000 meq | EXTENDED_RELEASE_TABLET | Freq: Once | ORAL | Status: AC
  Administered 2015-01-28: 40 meq via ORAL
  Filled 2015-01-28: qty 2

## 2015-01-28 MED ORDER — ONDANSETRON HCL 4 MG PO TABS: 4.0000 mg | ORAL_TABLET | Freq: Four times a day (QID) | ORAL | Status: DC | PRN

## 2015-01-28 MED ORDER — LORAZEPAM 2 MG/ML IJ SOLN
1.0000 mg | Freq: Once | INTRAMUSCULAR | Status: AC
  Administered 2015-01-28: 1 mg via INTRAVENOUS
  Filled 2015-01-28: qty 1

## 2015-01-28 MED ORDER — ONDANSETRON HCL 4 MG/2ML IJ SOLN
4.0000 mg | Freq: Once | INTRAMUSCULAR | Status: AC
  Administered 2015-01-28: 4 mg via INTRAVENOUS
  Filled 2015-01-28: qty 2

## 2015-01-28 MED ORDER — SODIUM CHLORIDE 0.9 % IV SOLN
1000.0000 mL | Freq: Once | INTRAVENOUS | Status: AC
  Administered 2015-01-28: 1000 mL via INTRAVENOUS

## 2015-01-28 NOTE — ED Provider Notes (Signed)
34 year old female transferred from was a [facility] to have MRI scan to rule out central cause of vertigo. Her symptoms sound much more like peripheral vertigo. She has intense spinning with associated nausea and vomiting which is worse with her eyes open and worse with any kind of movement. She did receive meclizine and diazepam at the other emergency department and was feeling a little better, but she got much worse with the ambulance ride here. She does relate a similar episode about 3 years ago. She has already had 50 mg of meclizine and 5 mg of diazepam. I'm going to hold off any additional benzodiazepine's but she will be given additional ondansetron for nausea and will be given some IV fluids. MRI has been ordered.  MRI has come back unremarkable. She continues to be very uncomfortable and is given a dose of lorazepam. Following this, she still is having vertigo but is able to open her eyes and turn her head. I was concerned about her ability to manage at home and offered observation stay in the hospital but patient stated she preferred to go home. She is discharged with prescriptions for meclizine and lorazepam. She was noted to be mildly hypokalemic so she was given a dose of potassium prior to discharge.  Results for orders placed or performed during the hospital encounter of 01/27/15  Basic metabolic panel  Result Value Ref Range   Sodium 137 135 - 145 mmol/L   Potassium 3.2 (L) 3.5 - 5.1 mmol/L   Chloride 97 (L) 101 - 111 mmol/L   CO2 26 22 - 32 mmol/L   Glucose, Bld 158 (H) 65 - 99 mg/dL   BUN 24 (H) 6 - 20 mg/dL   Creatinine, Ser 3.35 (H) 0.44 - 1.00 mg/dL   Calcium 9.2 8.9 - 82.5 mg/dL   GFR calc non Af Amer >60 >60 mL/min   GFR calc Af Amer >60 >60 mL/min   Anion gap 14 5 - 15  CBC  Result Value Ref Range   WBC 13.7 (H) 4.0 - 10.5 K/uL   RBC 4.64 3.87 - 5.11 MIL/uL   Hemoglobin 14.3 12.0 - 15.0 g/dL   HCT 18.9 84.2 - 10.3 %   MCV 88.8 78.0 - 100.0 fL   MCH 30.8  26.0 - 34.0 pg   MCHC 34.7 30.0 - 36.0 g/dL   RDW 12.8 11.8 - 86.7 %   Platelets 405 (H) 150 - 400 K/uL  Urinalysis, Routine w reflex microscopic (not at [facility])  Result Value Ref Range   Color, Urine AMBER (A) YELLOW   APPearance CLOUDY (A) CLEAR   Specific Gravity, Urine 1.023 1.005 - 1.030   pH 5.0 5.0 - 8.0   Glucose, UA NEGATIVE NEGATIVE mg/dL   Hgb urine dipstick NEGATIVE NEGATIVE   Bilirubin Urine SMALL (A) NEGATIVE   Ketones, ur 15 (A) NEGATIVE mg/dL   Protein, ur 737 (A) NEGATIVE mg/dL   Urobilinogen, UA 1.0 0.0 - 1.0 mg/dL   Nitrite NEGATIVE NEGATIVE   Leukocytes, UA NEGATIVE NEGATIVE  Urine microscopic-add on  Result Value Ref Range   Squamous Epithelial / LPF FEW (A) RARE   WBC, UA 0-2 <3 WBC/hpf   RBC / HPF 0-2 <3 RBC/hpf   Bacteria, UA FEW (A) RARE  CBG monitoring, ED  Result Value Ref Range   Glucose-Capillary 155 (H) 65 - 99 mg/dL  I-Stat beta hCG blood, ED ([facility], [facility], [facility] only)  Result Value Ref Range   I-stat hCG, quantitative <5.0 <5  mIU/mL   Comment 3          I-Stat CG4 Lactic Acid, ED  Result Value Ref Range   Lactic Acid, Venous 1.57 0.5 - 2.0 mmol/L  I-Stat CG4 Lactic Acid, ED  Result Value Ref Range   Lactic Acid, Venous 0.83 0.5 - 2.0 mmol/L    Mr Brain Wo Contrast  01/28/2015   CLINICAL DATA:  Initial evaluation for acute vertigo, dizziness since yesterday.  EXAM: MRI HEAD WITHOUT CONTRAST  TECHNIQUE: Multiplanar, multiecho pulse sequences of the brain and surrounding structures were obtained without intravenous contrast.  COMPARISON:  None.  FINDINGS: No abnormal foci of restricted diffusion to suggest acute intracranial infarct. The gray-white matter differentiation maintained. Normal intravascular flow voids are preserved. No acute or chronic intracranial hemorrhage.  Cerebral volume within normal limits for patient age. No significant white matter disease. No mass lesion, midline shift, or mass effect. No hydrocephalus. Probable small arachnoid cyst  lateral to the left cerebellar hemisphere. Apparent round internal in density within this lesion likely reflects normal cerebellar tissue. No other extra-axial fluid collection.  Craniocervical junction within normal limits. Pituitary gland normal.  No acute abnormality about the orbits.  Mild axial myalgia.  Small retention cysts within the right maxillary sinus. Paranasal sinuses are otherwise clear. No mastoid effusion. Inner ear structures grossly normal.  Bone marrow signal intensity within normal limits. Scalp soft tissues demonstrate no acute abnormality.  IMPRESSION: Negative brain MRI with no acute intracranial infarct or other process identified.   Electronically Signed   By: Rise Mu M.D.   On: 01/28/2015 03:44    Images viewed by me.   Medical screening examination/treatment/procedure(s) were conducted as a shared visit with non-physician practitioner(s) and myself.  I personally evaluated the patient during the encounter.   EKG Interpretation   Date/Time:  Thursday January 27 2015 21:49:23 EDT Ventricular Rate:  81 PR Interval:  158 QRS Duration: 96 QT Interval:  428 QTC Calculation: 497 R Axis:   -19 Text Interpretation:  Sinus rhythm Probable left atrial enlargement  Borderline left axis deviation Borderline T abnormalities, anterior leads  Prolonged QT interval Baseline wander in lead(s) II aVR aVF When compared  with ECG of 01/05/2015, No significant change was found Confirmed by [facility]   MD, DAVID ([id number]) on 01/28/2015 2:37:05 AM        Dione Booze, MD 01/28/15 [phone]

## 2015-01-28 NOTE — Discharge Instructions (Signed)
Vertigo °Vertigo means you feel like you or your surroundings are moving when they are not. Vertigo can be dangerous if it occurs when you are at work, driving, or performing difficult activities.  °CAUSES  °Vertigo occurs when there is a conflict of signals sent to your brain from the visual and sensory systems in your body. There are many different causes of vertigo, including: °· Infections, especially in the inner ear. °· A bad reaction to a drug or misuse of alcohol and medicines. °· Withdrawal from drugs or alcohol. °· Rapidly changing positions, such as lying down or rolling over in bed. °· A migraine headache. °· Decreased blood flow to the brain. °· Increased pressure in the brain from a head injury, infection, tumor, or bleeding. °SYMPTOMS  °You may feel as though the world is spinning around or you are falling to the ground. Because your balance is upset, vertigo can cause nausea and vomiting. You may have involuntary eye movements (nystagmus). °DIAGNOSIS  °Vertigo is usually diagnosed by physical exam. If the cause of your vertigo is unknown, your caregiver may perform imaging tests, such as an MRI scan (magnetic resonance imaging). °TREATMENT  °Most cases of vertigo resolve on their own, without treatment. Depending on the cause, your caregiver may prescribe certain medicines. If your vertigo is related to body position issues, your caregiver may recommend movements or procedures to correct the problem. In rare cases, if your vertigo is caused by certain inner ear problems, you may need surgery. °HOME CARE INSTRUCTIONS  °· Follow your caregiver's instructions. °· Avoid driving. °· Avoid operating heavy machinery. °· Avoid performing any tasks that would be dangerous to you or others during a vertigo episode. °· Tell your caregiver if you notice that certain medicines seem to be causing your vertigo. Some of the medicines used to treat vertigo episodes can actually make them worse in some people. °SEEK  IMMEDIATE MEDICAL CARE IF:  °· Your medicines do not relieve your vertigo or are making it worse. °· You develop problems with talking, walking, weakness, or using your arms, hands, or legs. °· You develop severe headaches. °· Your nausea or vomiting continues or gets worse. °· You develop visual changes. °· A family member notices behavioral changes. °· Your condition gets worse. °MAKE SURE YOU: °· Understand these instructions. °· Will watch your condition. °· Will get help right away if you are not doing well or get worse. °Document Released: 01/31/2005 Document Revised: 07/16/2011 Document Reviewed: 11/09/2010 °[employer]® Patient Information ©2015 [employer]. This information is not intended to replace advice given to you by your health care provider. Make sure you discuss any questions you have with your health care provider. ° °Meclizine tablets or capsules °What is this medicine? °MECLIZINE (MEK li zeen) is an antihistamine. It is used to prevent nausea, vomiting, or dizziness caused by motion sickness. It is also used to prevent and treat vertigo (extreme dizziness or a feeling that you or your surroundings are tilting or spinning around). °This medicine may be used for other purposes; ask your health care provider or pharmacist if you have questions. °COMMON BRAND NAME(S): Antivert, Dramamine Less Drowsy, Medivert, Meni-D °What should I tell my health care provider before I take this medicine? °They need to know if you have any of these conditions: °-asthma °-glaucoma °-prostate trouble °-stomach problems °-urinary problems °-an unusual or allergic reaction to meclizine, other medicines, foods, dyes, or preservatives °-pregnant or trying to get pregnant °-breast-feeding °How should I use this medicine? °  Take this medicine by mouth with a glass of water. Follow the directions on the prescription label. If you are using this medicine to prevent motion sickness, take the dose at least 1 hour before travel.  If it upsets your stomach, take it with food or milk. Take your doses at regular intervals. Do not take your medicine more often than directed. Talk to your pediatrician regarding the use of this medicine in children. Special care may be needed. Overdosage: If you think you have taken too much of this medicine contact a poison control center or emergency room at once. NOTE: This medicine is only for you. Do not share this medicine with others. What if I miss a dose? If you miss a dose, take it as soon as you can. If it is almost time for your next dose, take only that dose. Do not take double or extra doses. What may interact with this medicine? -barbiturate medicines for inducing sleep or treating seizures -digoxin -medicines for anxiety or sleeping problems, like alprazolam, diazepam or temazepam -medicines for hay fever and other allergies -medicines for mental depression -medicines for movement abnormalities as in Parkinson's disease, or for stomach problems -medicines for pain -medicines that relax muscles This list may not describe all possible interactions. Give your health care provider a list of all the medicines, herbs, non-prescription drugs, or dietary supplements you use. Also tell them if you smoke, drink alcohol, or use illegal drugs. Some items may interact with your medicine. What should I watch for while using this medicine? If you are taking this medicine on a regular schedule, visit your doctor or health care professional for regular checks on your progress. You may get dizzy, drowsy or have blurred vision. Do not drive, use machinery, or do anything that needs mental alertness until you know how this medicine affects you. Do not stand or sit up quickly, especially if you are an older patient. This reduces the risk of dizzy or fainting spells. Alcohol can increase possible dizziness. Avoid alcoholic drinks. Your mouth may get dry. Chewing sugarless gum or sucking hard candy,  and drinking plenty of water may help. Contact your doctor if the problem does not go away or is severe. This medicine may cause dry eyes and blurred vision. If you wear contact lenses you may feel some discomfort. Lubricating drops may help. See your eye doctor if the problem does not go away or is severe. What side effects may I notice from receiving this medicine? Side effects that you should report to your doctor or health care professional as soon as possible: -fainting spells -fast or irregular heartbeat Side effects that usually do not require medical attention (report to your doctor or health care professional if they continue or are bothersome): -constipation -difficulty passing urine -difficulty sleeping -headache -stomach upset This list may not describe all possible side effects. Call your doctor for medical advice about side effects. You may report side effects to FDA at 1-800-FDA-1088. Where should I keep my medicine? Keep out of the reach of children. Store at room temperature between 15 and 30 degrees C (59 and 86 degrees F). Keep container tightly closed. Throw away any unused medicine after the expiration date. NOTE: This sheet is a summary. It may not cover all possible information. If you have questions about this medicine, talk to your doctor, pharmacist, or health care provider.  2015, Elsevier/Gold Standard. (2007-10-30 10:35:36)  Lorazepam tablets What is this medicine? LORAZEPAM (lor A ze pam) is a  benzodiazepine. It is used to treat anxiety. This medicine may be used for other purposes; ask your health care provider or pharmacist if you have questions. COMMON BRAND NAME(S): Ativan What should I tell my health care provider before I take this medicine? They need to know if you have any of these conditions: -alcohol or drug abuse problem -bipolar disorder, depression, psychosis or other mental health condition -glaucoma -kidney or liver disease -lung disease or  breathing difficulties -myasthenia gravis -Parkinson's disease -seizures or a history of seizures -suicidal thoughts -an unusual or allergic reaction to lorazepam, other benzodiazepines, foods, dyes, or preservatives -pregnant or trying to get pregnant -breast-feeding How should I use this medicine? Take this medicine by mouth with a glass of water. Follow the directions on the prescription label. If it upsets your stomach, take it with food or milk. Take your medicine at regular intervals. Do not take it more often than directed. Do not stop taking except on the advice of your doctor or health care professional. Talk to your pediatrician regarding the use of this medicine in children. Special care may be needed. Overdosage: If you think you have taken too much of this medicine contact a poison control center or emergency room at once. NOTE: This medicine is only for you. Do not share this medicine with others. What if I miss a dose? If you miss a dose, take it as soon as you can. If it is almost time for your next dose, take only that dose. Do not take double or extra doses. What may interact with this medicine? -barbiturate medicines for inducing sleep or treating seizures, like phenobarbital -clozapine -medicines for depression, mental problems or psychiatric disturbances -medicines for sleep -phenytoin -probenecid -theophylline -valproic acid This list may not describe all possible interactions. Give your health care provider a list of all the medicines, herbs, non-prescription drugs, or dietary supplements you use. Also tell them if you smoke, drink alcohol, or use illegal drugs. Some items may interact with your medicine. What should I watch for while using this medicine? Visit your doctor or health care professional for regular checks on your progress. Your body may become dependent on this medicine, ask your doctor or health care professional if you still need to take it. However,  if you have been taking this medicine regularly for some time, do not suddenly stop taking it. You must gradually reduce the dose or you may get severe side effects. Ask your doctor or health care professional for advice before increasing or decreasing the dose. Even after you stop taking this medicine it can still affect your body for several days. You may get drowsy or dizzy. Do not drive, use machinery, or do anything that needs mental alertness until you know how this medicine affects you. To reduce the risk of dizzy and fainting spells, do not stand or sit up quickly, especially if you are an older patient. Alcohol may increase dizziness and drowsiness. Avoid alcoholic drinks. Do not treat yourself for coughs, colds or allergies without asking your doctor or health care professional for advice. Some ingredients can increase possible side effects. What side effects may I notice from receiving this medicine? Side effects that you should report to your doctor or health care professional as soon as possible: -changes in vision -confusion -depression -mood changes, excitability or aggressive behavior -movement difficulty, staggering or jerky movements -muscle cramps -restlessness -weakness or tiredness Side effects that usually do not require medical attention (report to your doctor or health  care professional if they continue or are bothersome): -constipation or diarrhea -difficulty sleeping, nightmares -dizziness, drowsiness -headache -nausea, vomiting This list may not describe all possible side effects. Call your doctor for medical advice about side effects. You may report side effects to FDA at 1-800-FDA-1088. Where should I keep my medicine? Keep out of the reach of children. This medicine can be abused. Keep your medicine in a safe place to protect it from theft. Do not share this medicine with anyone. Selling or giving away this medicine is dangerous and against the law. Store at room  temperature between 20 and 25 degrees C (68 and 77 degrees F). Protect from light. Keep container tightly closed. Throw away any unused medicine after the expiration date. NOTE: This sheet is a summary. It may not cover all possible information. If you have questions about this medicine, talk to your doctor, pharmacist, or health care provider.  2015, Elsevier/Gold Standard. (2007-10-24 14:58:20)

## 2015-01-28 NOTE — ED Notes (Signed)
Patient transported to MRI 

## 2015-01-29 ENCOUNTER — Encounter: Payer: Self-pay | Admitting: Cardiology

## 2015-01-29 DIAGNOSIS — H8110 Benign paroxysmal vertigo, unspecified ear: Secondary | ICD-10-CM | POA: Insufficient documentation

## 2015-01-29 NOTE — Assessment & Plan Note (Signed)
She is starting to work with cardiac rehabilitation. There have been hard time getting out a regimen for her to do. I still think any moderate exercise with a consistent dosing levels is the safest option.

## 2015-01-29 NOTE — Assessment & Plan Note (Addendum)
Really hard to tell what her dizziness stem stroke. I don't think it's orthostatic based on her description. It sounds more like vertigo. I gave her a prescription for when necessary Antivert. Symptoms do not really sound likely are related to an arrhythmia either., Would consider evaluation by neurology

## 2015-01-29 NOTE — Assessment & Plan Note (Addendum)
BNP from September 8 was 18.8 which would suggest she was completely euvolemic. He does seem to respond better to torsemide than Lasix. She also uses when necessary Zaroxolyn for were swelling days.  I talked her about daily weights and adjusting her diuresis BiPAP. She continues to have elevated diastolic pressure today and had a hypertensive response to exercise.   Plan: diastolic pressure of 100 today. Hypertensive response on the GXT suggesting need for additional afterload reduction.  convert hydralazine to twice a day and increase total dose to 50 mg twice a day from 25mg  3 times a day.  Heart rate remains elevated at baseline today. I'll increase her metoprolol gradually to 50 mg. Starting tomorrow Convert to PM dosing.  If she doesn't tolerate increased dose of beta blocker because of blood pressure or fatigue, I would consider using Corlaner for additional rate control.  At present, I don't see any acute need for invasive evaluation, but if she continues to be difficult to manage from a volume standpoint, we may need to consider right heart catheterization to better assess pulmonary pressures and left-sided filling pressures.  We'll need to closely monitor her potassium levels now that she is on spironolactone plus supplement. Will need close followup

## 2015-01-29 NOTE — Assessment & Plan Note (Signed)
She has lost a little chest twinging-type symptoms. These don't really see him in July in nature. She did relatively well on her GXT besides showing excised intolerance and deconditioning. I actually think, after she completes cardiac rehabilitation, the best evaluation would be a cardiopulmonary exercise test. This will give [place] a better overall heart failure assessment. Depending on how she would do on that, I would potentially consider referral to our clinic for assistance in management.

## 2015-01-29 NOTE — Assessment & Plan Note (Signed)
We have increased her hydralazine 25 3 times a day. I will not change to 50 twice a day. I thought about the possibility of ideal, but apparently she did not do well with the nitrate portion. She also has had angioedema from ACE inhibitors, but I remain somewhat concerned about using an ARB..   Plan: Convert to 50 mg twice a day of hydralazine. Also increase her beta blocker as described in chronic combined heart failure section.

## 2015-01-29 NOTE — Assessment & Plan Note (Signed)
Continue to support her efforts at cardiac rehabilitation. She got he has significant deconditioning, but I think overall for her to become more active with building up her exercise tolerance, hopefully once she is able to become more stable but with exercise, she will then go to work on weight loss with dietary modification and exercise.

## 2015-01-31 ENCOUNTER — Encounter (HOSPITAL_COMMUNITY): Admission: RE | Admit: 2015-01-31 | Payer: Commercial Managed Care - PPO | Source: Ambulatory Visit

## 2015-01-31 ENCOUNTER — Encounter (HOSPITAL_COMMUNITY): Payer: Commercial Managed Care - PPO

## 2015-02-01 ENCOUNTER — Ambulatory Visit: Payer: Commercial Managed Care - PPO | Admitting: Internal Medicine

## 2015-02-02 ENCOUNTER — Encounter (HOSPITAL_COMMUNITY): Payer: Commercial Managed Care - PPO

## 2015-02-03 ENCOUNTER — Telehealth (HOSPITAL_COMMUNITY): Payer: Self-pay | Admitting: *Deleted

## 2015-02-03 ENCOUNTER — Ambulatory Visit (INDEPENDENT_AMBULATORY_CARE_PROVIDER_SITE_OTHER): Payer: Commercial Managed Care - PPO | Admitting: Internal Medicine

## 2015-02-03 ENCOUNTER — Encounter: Payer: Self-pay | Admitting: Internal Medicine

## 2015-02-03 VITALS — BP 122/80 | HR 80 | Temp 97.7°F | Wt 194.5 lb

## 2015-02-03 DIAGNOSIS — H8113 Benign paroxysmal vertigo, bilateral: Secondary | ICD-10-CM

## 2015-02-03 MED ORDER — MECLIZINE HCL 25 MG PO TABS: 25.0000 mg | ORAL_TABLET | Freq: Three times a day (TID) | ORAL | Status: DC | PRN

## 2015-02-03 NOTE — Progress Notes (Signed)
Pre visit review using our clinic review tool, if applicable. No additional management support is needed unless otherwise documented below in the visit note. 

## 2015-02-03 NOTE — Assessment & Plan Note (Signed)
Okay for her to continue using meclizine but have asked her to start cutting back on lorazepam as this is not as effective. She is not taking concurrent with the klonopin and reinforced that as well. Given exercises for her own vestibular rehab to use. Advised she can return to cardiac rehab Monday as symptoms as gradually improving. Talked to her about continuing with the exercises even after symptoms gone to help decrease recurrence.

## 2015-02-03 NOTE — Patient Instructions (Signed)
We have sent in the refill of the meclizine and have given you a note for cardiac rehab.   I would recommend to keep with the meclizine and try to work yourself down on the ativan as that is likely not doing as much and can make you sleepy.   It is okay to take 50 mg of the meclizine if needed but it can make you more sleepy.

## 2015-02-03 NOTE — Progress Notes (Signed)
   Subjective:    Patient ID: Meredith Diaz, female    DOB: 1981-04-22, 34 y.o.   MRN: [id number]  HPI The patient is a 34 YO female coming in today for ER follow up (seen for intense dizziness treated with meclizine and valium, MRI head normal). She returns today with some of the same symptoms. Less intense overall. Has tried vestibular rehab for a similar episode several years ago. Does not feel that it worked that well. No ear pain or hearing change. Saw ENT last episode as well and not given any good answers. Has not been driving.   Review of Systems  Constitutional: Positive for activity change. Negative for fever, appetite change, fatigue and unexpected weight change.  HENT: Negative.   Eyes: Negative.   Respiratory: Negative for cough, chest tightness, shortness of breath and wheezing.   Cardiovascular: Negative for chest pain, palpitations and leg swelling.  Gastrointestinal: Negative for nausea, diarrhea, constipation, blood in stool and abdominal distention.  Musculoskeletal: Positive for myalgias, back pain and arthralgias. Negative for gait problem.  Neurological: Positive for dizziness. Negative for weakness, numbness and headaches.  Psychiatric/Behavioral: Positive for dysphoric mood and decreased concentration. Negative for suicidal ideas and self-injury.      Objective:   Physical Exam  Constitutional: She is oriented to person, place, and time. She appears well-developed and well-nourished. No distress.  Overweight  HENT:  Head: Normocephalic and atraumatic.  Right Ear: External ear normal.  Left Ear: External ear normal.  Mouth/Throat: Oropharynx is clear and moist.  Eyes: EOM are normal.  Neck: Normal range of motion.  Cardiovascular: Normal rate and regular rhythm.   Pulmonary/Chest: Effort normal and breath sounds normal. No respiratory distress. She has no wheezes. She has no rales.  Abdominal: Soft. Bowel sounds are normal. She exhibits no distension and no  mass. There is no tenderness. There is no rebound and no guarding.  Neurological: She is alert and oriented to person, place, and time. Coordination normal.  Skin: Skin is warm and dry.   Filed Vitals:   02/03/15 0803  BP: 122/80  Pulse: 80  Temp: 97.7 F (36.5 C)  Weight: 194 lb 8 oz (88.225 kg)  SpO2: 93%      Assessment & Plan:

## 2015-02-04 ENCOUNTER — Encounter (HOSPITAL_COMMUNITY): Payer: Commercial Managed Care - PPO

## 2015-02-04 ENCOUNTER — Encounter (HOSPITAL_COMMUNITY): Admission: RE | Admit: 2015-02-04 | Payer: Commercial Managed Care - PPO | Source: Ambulatory Visit

## 2015-02-07 ENCOUNTER — Encounter (HOSPITAL_COMMUNITY)
Admission: RE | Admit: 2015-02-07 | Discharge: 2015-02-07 | Disposition: A | Discharge status: Home / Self Care | Payer: Commercial Managed Care - PPO | Source: Ambulatory Visit | Attending: Cardiology | Admitting: Cardiology

## 2015-02-07 ENCOUNTER — Encounter (HOSPITAL_COMMUNITY): Payer: Commercial Managed Care - PPO

## 2015-02-07 DIAGNOSIS — I509 Heart failure, unspecified: Secondary | ICD-10-CM | POA: Insufficient documentation

## 2015-02-07 NOTE — Progress Notes (Signed)
30 day Psychosocial Assessment  Sent Quality of life survey results to Dr. Okey Dupre.  Pt placed on Cymbalta.  At first check in with pt to soon to say if this was working for patient.  Clinical observations revealed engaging with fellow participants and increasingly positive outlook and remarks.  Pt returns to rehab today after absence for vertigo.  Pt feels better and is glad she is no longer dizzy.  Heart rate improved with the increased dose of beta blocker.  Pt able to participate in rehab today with no complaints.  Continue to encourage and support pt efforts for heart healthy changes. Alanson Aly, BSN

## 2015-02-08 ENCOUNTER — Other Ambulatory Visit: Payer: Self-pay | Admitting: Internal Medicine

## 2015-02-09 ENCOUNTER — Encounter (HOSPITAL_COMMUNITY): Payer: Commercial Managed Care - PPO

## 2015-02-09 ENCOUNTER — Encounter (HOSPITAL_COMMUNITY)
Admission: RE | Admit: 2015-02-09 | Discharge: 2015-02-09 | Disposition: A | Discharge status: Home / Self Care | Payer: Commercial Managed Care - PPO | Source: Ambulatory Visit | Attending: Cardiology | Admitting: Cardiology

## 2015-02-09 DIAGNOSIS — I509 Heart failure, unspecified: Secondary | ICD-10-CM | POA: Diagnosis not present

## 2015-02-11 ENCOUNTER — Encounter (HOSPITAL_COMMUNITY)
Admission: RE | Admit: 2015-02-11 | Discharge: 2015-02-11 | Disposition: A | Discharge status: Home / Self Care | Payer: Commercial Managed Care - PPO | Source: Ambulatory Visit | Attending: Cardiology | Admitting: Cardiology

## 2015-02-11 ENCOUNTER — Encounter (HOSPITAL_COMMUNITY): Payer: Commercial Managed Care - PPO

## 2015-02-11 DIAGNOSIS — I509 Heart failure, unspecified: Secondary | ICD-10-CM | POA: Diagnosis not present

## 2015-02-14 ENCOUNTER — Encounter (HOSPITAL_COMMUNITY): Payer: Commercial Managed Care - PPO

## 2015-02-14 ENCOUNTER — Encounter (HOSPITAL_COMMUNITY)
Admission: RE | Admit: 2015-02-14 | Discharge: 2015-02-14 | Disposition: A | Discharge status: Home / Self Care | Payer: Commercial Managed Care - PPO | Source: Ambulatory Visit | Attending: Cardiology | Admitting: Cardiology

## 2015-02-14 DIAGNOSIS — I509 Heart failure, unspecified: Secondary | ICD-10-CM | POA: Diagnosis not present

## 2015-02-16 ENCOUNTER — Encounter (HOSPITAL_COMMUNITY)
Admission: RE | Admit: 2015-02-16 | Discharge: 2015-02-16 | Disposition: A | Discharge status: Home / Self Care | Payer: Commercial Managed Care - PPO | Source: Ambulatory Visit | Attending: Cardiology | Admitting: Cardiology

## 2015-02-16 ENCOUNTER — Encounter (HOSPITAL_COMMUNITY): Payer: Commercial Managed Care - PPO

## 2015-02-16 DIAGNOSIS — I509 Heart failure, unspecified: Secondary | ICD-10-CM | POA: Diagnosis not present

## 2015-02-18 ENCOUNTER — Encounter (HOSPITAL_COMMUNITY)
Admission: RE | Admit: 2015-02-18 | Discharge: 2015-02-18 | Disposition: A | Discharge status: Home / Self Care | Payer: Commercial Managed Care - PPO | Source: Ambulatory Visit | Attending: Cardiology | Admitting: Cardiology

## 2015-02-18 ENCOUNTER — Encounter (HOSPITAL_COMMUNITY): Payer: Commercial Managed Care - PPO

## 2015-02-18 DIAGNOSIS — I509 Heart failure, unspecified: Secondary | ICD-10-CM | POA: Diagnosis not present

## 2015-02-21 ENCOUNTER — Encounter (HOSPITAL_COMMUNITY): Payer: Commercial Managed Care - PPO

## 2015-02-21 ENCOUNTER — Encounter (HOSPITAL_COMMUNITY)
Admission: RE | Admit: 2015-02-21 | Discharge: 2015-02-21 | Disposition: A | Discharge status: Home / Self Care | Payer: Commercial Managed Care - PPO | Source: Ambulatory Visit | Attending: Cardiology | Admitting: Cardiology

## 2015-02-21 DIAGNOSIS — I509 Heart failure, unspecified: Secondary | ICD-10-CM | POA: Diagnosis not present

## 2015-02-23 ENCOUNTER — Encounter (HOSPITAL_COMMUNITY): Payer: Commercial Managed Care - PPO

## 2015-02-23 ENCOUNTER — Encounter (HOSPITAL_COMMUNITY)
Admission: RE | Admit: 2015-02-23 | Discharge: 2015-02-23 | Disposition: A | Discharge status: Home / Self Care | Payer: Commercial Managed Care - PPO | Source: Ambulatory Visit | Attending: Cardiology | Admitting: Cardiology

## 2015-02-23 DIAGNOSIS — I509 Heart failure, unspecified: Secondary | ICD-10-CM | POA: Diagnosis not present

## 2015-02-24 ENCOUNTER — Encounter: Payer: Self-pay | Admitting: Pulmonary Disease

## 2015-02-25 ENCOUNTER — Encounter (HOSPITAL_COMMUNITY): Payer: Commercial Managed Care - PPO

## 2015-02-25 ENCOUNTER — Encounter (HOSPITAL_COMMUNITY)
Admission: RE | Admit: 2015-02-25 | Discharge: 2015-02-25 | Disposition: A | Discharge status: Home / Self Care | Payer: Commercial Managed Care - PPO | Source: Ambulatory Visit | Attending: Cardiology | Admitting: Cardiology

## 2015-02-25 DIAGNOSIS — I509 Heart failure, unspecified: Secondary | ICD-10-CM | POA: Diagnosis not present

## 2015-02-28 ENCOUNTER — Encounter (HOSPITAL_COMMUNITY)
Admission: RE | Admit: 2015-02-28 | Discharge: 2015-02-28 | Disposition: A | Discharge status: Home / Self Care | Payer: Commercial Managed Care - PPO | Source: Ambulatory Visit | Attending: Cardiology | Admitting: Cardiology

## 2015-02-28 ENCOUNTER — Encounter (HOSPITAL_COMMUNITY): Payer: Commercial Managed Care - PPO

## 2015-02-28 DIAGNOSIS — I509 Heart failure, unspecified: Secondary | ICD-10-CM | POA: Diagnosis not present

## 2015-03-02 ENCOUNTER — Encounter (HOSPITAL_COMMUNITY)
Admission: RE | Admit: 2015-03-02 | Discharge: 2015-03-02 | Disposition: A | Discharge status: Home / Self Care | Payer: Commercial Managed Care - PPO | Source: Ambulatory Visit | Attending: Cardiology | Admitting: Cardiology

## 2015-03-02 ENCOUNTER — Encounter (HOSPITAL_COMMUNITY): Payer: Commercial Managed Care - PPO

## 2015-03-02 DIAGNOSIS — I509 Heart failure, unspecified: Secondary | ICD-10-CM | POA: Diagnosis not present

## 2015-03-03 ENCOUNTER — Other Ambulatory Visit: Payer: Self-pay | Admitting: Internal Medicine

## 2015-03-04 ENCOUNTER — Encounter (HOSPITAL_COMMUNITY): Payer: Commercial Managed Care - PPO

## 2015-03-04 ENCOUNTER — Encounter (HOSPITAL_COMMUNITY)
Admission: RE | Admit: 2015-03-04 | Discharge: 2015-03-04 | Disposition: A | Discharge status: Home / Self Care | Payer: Commercial Managed Care - PPO | Source: Ambulatory Visit | Attending: Cardiology | Admitting: Cardiology

## 2015-03-04 DIAGNOSIS — I509 Heart failure, unspecified: Secondary | ICD-10-CM | POA: Diagnosis not present

## 2015-03-04 NOTE — Progress Notes (Signed)
Staff reported pt verbalizing depressive symptoms during conversation at cardiac rehab.  I spoke to pt to assess. Pt denies feelings  of depression or hopelessness.  Pt denies suicidal ideation. Pt is unsure if Cymbalta is effective however she does note decreased anxiety.  Pt also reports that she does not have unpleasant side effects she has experienced with other anti-depression medications.  Pt does report continued anxiety however it is diminished.  Pt feels anxious about her exercise plans post cardiac rehab graduation. Pt states she feels much better with regular exercise and would like to continue.  However she is concerned about her ability to self discipline herself to maintain exercise habits.  Pt given information about cardiac maintenance program.  Pt would like to consider this as a transition to exercising on her own.  Pt given emotional support and reassurance.

## 2015-03-07 ENCOUNTER — Encounter (HOSPITAL_COMMUNITY): Payer: Commercial Managed Care - PPO

## 2015-03-07 ENCOUNTER — Encounter (HOSPITAL_COMMUNITY)
Admission: RE | Admit: 2015-03-07 | Discharge: 2015-03-07 | Disposition: A | Discharge status: Home / Self Care | Payer: Commercial Managed Care - PPO | Source: Ambulatory Visit | Attending: Cardiology | Admitting: Cardiology

## 2015-03-07 DIAGNOSIS — I509 Heart failure, unspecified: Secondary | ICD-10-CM | POA: Diagnosis not present

## 2015-03-09 ENCOUNTER — Encounter (HOSPITAL_COMMUNITY): Payer: Commercial Managed Care - PPO

## 2015-03-09 ENCOUNTER — Encounter (HOSPITAL_COMMUNITY)
Admission: RE | Admit: 2015-03-09 | Discharge: 2015-03-09 | Disposition: A | Discharge status: Home / Self Care | Payer: Commercial Managed Care - PPO | Source: Ambulatory Visit | Attending: Cardiology | Admitting: Cardiology

## 2015-03-09 DIAGNOSIS — I509 Heart failure, unspecified: Secondary | ICD-10-CM | POA: Diagnosis present

## 2015-03-09 NOTE — Progress Notes (Signed)
Pt watched heart failure education video.  Pt expressed concerns regarding the progression of heart failure along with the poor prognosis.  Pt give emotional support. Pt expressed interest in pursuing referral to heart failure clinic.  Will in basket Dr. Herbie [place]. Pt at times continues to have issues with depression. Pt often engages in negative remarks.  Pt feels the cymbalta has helped the anxiety.  Pt may benefit from counseling support along with medication therapy. Alanson Aly, BSN

## 2015-03-10 ENCOUNTER — Telehealth (HOSPITAL_COMMUNITY): Payer: Self-pay | Admitting: Vascular Surgery

## 2015-03-10 NOTE — Telephone Encounter (Signed)
Left message to make New pt appt

## 2015-03-11 ENCOUNTER — Encounter (HOSPITAL_COMMUNITY): Payer: Commercial Managed Care - PPO

## 2015-03-11 ENCOUNTER — Encounter (HOSPITAL_COMMUNITY)
Admission: RE | Admit: 2015-03-11 | Discharge: 2015-03-11 | Disposition: A | Discharge status: Home / Self Care | Payer: Commercial Managed Care - PPO | Source: Ambulatory Visit | Attending: Cardiology | Admitting: Cardiology

## 2015-03-11 DIAGNOSIS — I509 Heart failure, unspecified: Secondary | ICD-10-CM | POA: Diagnosis not present

## 2015-03-14 ENCOUNTER — Encounter (HOSPITAL_COMMUNITY): Payer: Commercial Managed Care - PPO

## 2015-03-15 ENCOUNTER — Encounter: Payer: Self-pay | Admitting: Internal Medicine

## 2015-03-15 ENCOUNTER — Ambulatory Visit (INDEPENDENT_AMBULATORY_CARE_PROVIDER_SITE_OTHER): Payer: Commercial Managed Care - PPO | Admitting: Internal Medicine

## 2015-03-15 VITALS — BP 122/82 | HR 82 | Temp 98.5°F | Resp 12 | Ht 60.0 in | Wt 188.6 lb

## 2015-03-15 DIAGNOSIS — F4323 Adjustment disorder with mixed anxiety and depressed mood: Secondary | ICD-10-CM

## 2015-03-15 MED ORDER — DULOXETINE HCL 60 MG PO CPEP: 60.0000 mg | ORAL_CAPSULE | Freq: Every day | ORAL | Status: DC

## 2015-03-15 NOTE — Progress Notes (Signed)
   Subjective:    Patient ID: Meredith Diaz, female    DOB: February 23, 1981, 34 y.o.   MRN: [id number]  HPI The patient is a 34 YO female coming in for follow up of her anxiety. She is taking the cymbalta and still feels like it is helping. She is still having some mild anxiety. She is worried about stopping cardiac rehab and not knowing if she will keep up with the exercise or if she will feel safe to start exercising. No SI/HI. She will be going to the heart failure clinic in the next couple of weeks which makes her feel more secure. No new complaints or symptoms.   Review of Systems  Constitutional: Positive for activity change. Negative for fever, appetite change, fatigue and unexpected weight change.  HENT: Negative.   Eyes: Negative.   Respiratory: Negative for cough, chest tightness, shortness of breath and wheezing.   Cardiovascular: Negative for chest pain, palpitations and leg swelling.  Gastrointestinal: Negative for nausea, diarrhea, constipation, blood in stool and abdominal distention.  Musculoskeletal: Positive for myalgias, back pain and arthralgias. Negative for gait problem.  Neurological: Negative for dizziness, weakness, numbness and headaches.  Psychiatric/Behavioral: Positive for dysphoric mood and decreased concentration. Negative for suicidal ideas and self-injury.      Objective:   Physical Exam  Constitutional: She is oriented to person, place, and time. She appears well-developed and well-nourished. No distress.  Overweight  HENT:  Head: Normocephalic and atraumatic.  Right Ear: External ear normal.  Left Ear: External ear normal.  Mouth/Throat: Oropharynx is clear and moist.  Eyes: EOM are normal.  Neck: Normal range of motion.  Cardiovascular: Normal rate and regular rhythm.   Pulmonary/Chest: Effort normal and breath sounds normal. No respiratory distress. She has no wheezes. She has no rales.  Abdominal: Soft. Bowel sounds are normal. She exhibits no  distension and no mass. There is no tenderness. There is no rebound and no guarding.  Neurological: She is alert and oriented to person, place, and time. Coordination normal.  Skin: Skin is warm and dry.   Filed Vitals:   03/15/15 0827  BP: 122/82  Pulse: 82  Temp: 98.5 F (36.9 C)  TempSrc: Oral  Resp: 12  Height: 5' (1.524 m)  Weight: 188 lb 9.6 oz (85.548 kg)  SpO2: 93%      Assessment & Plan:

## 2015-03-15 NOTE — Assessment & Plan Note (Signed)
Will increase cymbalta to 60 mg daily and try to decrease usage of clonazepam. Think that her anxiety overall is better controlled than off the cymbalta. No side effects. Encouraged her to continue exercising as this is good for anxiety and stress and for her to continue working on her goals.

## 2015-03-15 NOTE — Patient Instructions (Signed)
We have sent in the cymbalta 60 mg capsules. You can start taking 2 of the 30 mg capsules daily and see how you do with the 60 mg.   You can use vaseline on the spot on your ear twice a day to help with the dry skin.   Come back in about 3 months for a check up. If you have any problems sooner call us back.

## 2015-03-15 NOTE — Progress Notes (Signed)
Pre visit review using our clinic review tool, if applicable. No additional management support is needed unless otherwise documented below in the visit note. 

## 2015-03-15 NOTE — Assessment & Plan Note (Signed)
Feel that this is driving some of her breathing problems more than her heart and lungs. She is working with cardiac rehab and her endurance is increased which is good. Strongly encouraged her to come up with a schedule for exercise afterwards so that she maintains her progress. Working on weight loss will improve her breathing and decrease stress on her heart.

## 2015-03-16 ENCOUNTER — Encounter (HOSPITAL_COMMUNITY): Payer: Commercial Managed Care - PPO

## 2015-03-16 ENCOUNTER — Encounter (HOSPITAL_COMMUNITY)
Admission: RE | Admit: 2015-03-16 | Discharge: 2015-03-16 | Disposition: A | Discharge status: Home / Self Care | Payer: Commercial Managed Care - PPO | Source: Ambulatory Visit | Attending: Cardiology | Admitting: Cardiology

## 2015-03-16 DIAGNOSIS — I509 Heart failure, unspecified: Secondary | ICD-10-CM | POA: Diagnosis not present

## 2015-03-17 ENCOUNTER — Encounter: Payer: Self-pay | Admitting: Pulmonary Disease

## 2015-03-17 ENCOUNTER — Ambulatory Visit (INDEPENDENT_AMBULATORY_CARE_PROVIDER_SITE_OTHER): Payer: Commercial Managed Care - PPO | Admitting: Pulmonary Disease

## 2015-03-17 VITALS — BP 126/74 | HR 106 | Temp 98.0°F | Wt 186.8 lb

## 2015-03-17 DIAGNOSIS — I429 Cardiomyopathy, unspecified: Secondary | ICD-10-CM

## 2015-03-17 DIAGNOSIS — J4530 Mild persistent asthma, uncomplicated: Secondary | ICD-10-CM

## 2015-03-17 DIAGNOSIS — G4733 Obstructive sleep apnea (adult) (pediatric): Secondary | ICD-10-CM

## 2015-03-17 DIAGNOSIS — Z9989 Dependence on other enabling machines and devices: Secondary | ICD-10-CM

## 2015-03-17 DIAGNOSIS — J984 Other disorders of lung: Secondary | ICD-10-CM

## 2015-03-17 DIAGNOSIS — I428 Other cardiomyopathies: Secondary | ICD-10-CM

## 2015-03-17 DIAGNOSIS — I5042 Chronic combined systolic (congestive) and diastolic (congestive) heart failure: Secondary | ICD-10-CM

## 2015-03-17 DIAGNOSIS — M4120 Other idiopathic scoliosis, site unspecified: Secondary | ICD-10-CM

## 2015-03-17 DIAGNOSIS — I10 Essential (primary) hypertension: Secondary | ICD-10-CM

## 2015-03-17 MED ORDER — ALBUTEROL SULFATE HFA 108 (90 BASE) MCG/ACT IN AERS: 2.0000 | INHALATION_SPRAY | RESPIRATORY_TRACT | Status: DC | PRN

## 2015-03-17 MED ORDER — CLONAZEPAM 1 MG PO TABS: 1.0000 mg | ORAL_TABLET | Freq: Two times a day (BID) | ORAL | Status: DC

## 2015-03-17 MED ORDER — TRAZODONE HCL 50 MG PO TABS: ORAL_TABLET | ORAL | Status: DC

## 2015-03-17 NOTE — Progress Notes (Signed)
Subjective:     Patient ID: Meredith Diaz, female   DOB: 1981-01-24, 34 y.o.   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, Programmer, multimedia for [employer], pt of DrKollar & referred for pulmonary evaluation w/ hx asthma- she notes increased DOE & no relief from her inhalers> Meredith Diaz is a never smoker & was diagnosed w/ asthma first in 18 at age 49 or so when [facility] for pneumonia (Cariology said she was hosp for CHF w/ EF=25-30% and was in hosp for 74mo) & she's had trouble ever since then, disch on NEBS;  Treated over the yrs w/ MDIs, Symbicort/Advair (the latter didn't help), last had Pred before 2013 hosp;  She was allergy tested in middle school & essentially neg x mild grass reaction by her memory;  She gets "sinus infections" a couple of times each yr (she thinks this is her main trigger) & uses Claritin/ Flonase prn;  Symptoms included SOB/ DOE w/ min activ, feels like she can't get a deep breath/ can't get enough air "in"/ not satisfied breathing (states this sensation is present all the time/constant, & she notes "gasping"); states she notes some wheezing when supine at night & some voice issues (?VCD, tight burning in throat) but denies reflux/ dysphagia/ nocturnal cough or choking (note- she has IBS & s/pGB 2013)... She also c/o sleep issues> can't sleep well while lying down, husb c/o her "wheezing"/ some snoring (but does not indicate that she stops breathing)/ restless sleeper/ leg jerks; she does have some daytime sleepiness issues w/ incr fatigue at work and she'll take naps on weekends; ESS=12... Current Meds> Symbicort160- 2puffs daily, AlbutHFA for prn use...       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 Cumulative data from cardiac evals as follows, she was told  her cardiac issues are ?congenital, ?virus damaged her heart>> she has never had cardiac rehab etc...   CXR 2013 at [facility] showed cardiomegaly, biapical pleural thickening, otherw clear & NAD...  Cardiac MRI from [facility] in 2013 showed EF=52% w/ mild global HK, min AI, RV- wnl, Ao- wnl, mild pectus excavqatum w/ deviation of cardiac apex...  Cath was done in 2013 but I cannot find that report (said no signif CAD)...  EKG 11/15 showed STachy, rate 103, ?LAE, suggestive LVH, NSSTTWA...   2DEcho 11/15 by DrHarding showed mild LVH, ?poss apical variant hypertrophic cardiomyopathy, EF=40-45% w/ diffuse HK, Gr1DD, MV- mildly thickened leaflets and trivMR, norm LA size...  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 Notes hx anorexia/ depression when in high school w/ lowest wt=80 lbs; psyche rx & counseling resulted in wt gain & she was ~150 lbs in college; gained to 180# after college & lost wt to 165 prior to her marriage at age 51; now she is 34 y/o & has gained to max 203#... She is allergic to ACE inhibitors w/ prev angioedema...  EXAM reveals Afeb, VSS, O2sat=93% on RA at rest;  HEENT- neg, mallampati 2, voice sl hoarse;  Chest- decr BS bilat, clear,  decr diaph excursions by percussion, freq sign breaths;  Heart- RRR w/p m/r/g;  Ext- no c/c/e...  Recent LABS> Chems- wnl;  CBC- ok w/ Hg=12.4, WBC=14K;  TSH=2.62;  B12 level= 180-250 (on oral B12- 1081mcg/d)...  CXR 08/16/14 showed norm heart size, clear lungs, NAD...  Spirometry 08/16/14> poor effort (breathing "high" in her lung volumes)> FVC=0.84 (27%), FEV1=0.66 (24%), %1sec=78, mid-flows reduced at 18% predicted; Test indicated severe restriction w/ small airways dis...   Ambulatory oxygen saturations>  O2sat=95% on RA at rest w/ pulse=89/min;  She walked 2 laps and stopped due to SOB- O2sat=83% w/ pulse 121/min...  FENO= 10ppb (results <25ppd implies absence of eosinophilic airway inflammation).  Epworth Sleepiness Score= 12 (indicating that she may be excessively sleepy depending on the situation... IMP/PLAN>>  Airiana has been diagnosed w/ asthma over the last 20+yrs but her current symptoms and spirometry looks more restricted- prob due to "chest wall factors" as we discussed;  She has additional issues from obesity- r/o OSA, and from her Cardiac disease- hx congenital HD w/ surg, HBP, cardiomyopathy w/ combined sys/diast CHF- followed & treated by Cards, DrHarding... We discussed the nature of her dyspnea and the need for additional testing and a trial of KLONOPIN 0.5mg  Bid... We will sched FullPFTs and a Sleep Study and have her return after that...   ~  August 26, 2014:  2wk ROV & Meredith Diaz returns having completed her Full PFTs and Sleep study> she has in addition been on the Klonopin 0.5mg  Bid but notes no improvement in her breathing on this dose (tol well just no better)...  Full PFTs 08/25/14 showed FVC=0.87 (26%), FEV1=0.77(27%), %1sec=88, mid-flows reduced at 28% predicted; after bronchodil- the FEV1 improved 6%; Lung volumes reduced w/ TLC=1.88 (42%), RV=0.85 (66%), RV/TLC=45 (some air trapping); DLCO=25% predicted, but corrects to normal (102%) when alveolar ventilation is taken into account...  Sleep Polysomnogram 08/18/14> Mod OSA w/ AHI=20/hr and REM AHI=54, mod snoring, desaturation to 64%...  IMP/PLANS>>  PFTs severely restricted (?effort/ accuracy) & seems to be mostly related to obesity/ chest wall and not to underlying lung parenchymal factors (CXR clear, exam clear, we will consider hi-res CTChest later if needed); In this regard we will increase the KLONOPIN to 1mg Bid before giving up on this med; she understands how important wt reduction is to her overall improvement- may want to  consider eating disorder counseling again... Her Sleep study reveals mod OSA & nocturnal hypoxemia- I discussed w/ DrClance & we decided on CPAP set up> RESMED S10 machine- air/auto w/ heated humidity & climate controlled tubing, set on AUTO 5-15, enroll in AirView, mask interface of choice per DME company & pt preference... We will plan ROV recheck in 6 weeks to review compliance & efficacy, then consider doing an ONO...    ~  October 14, 2014:  6wk ROV & Nazia has lost 2# in the interval but not able to exercise- walks some, gets winded, stairs are difficult but able to negotiate one flight; we reviewed how critically important wt reduction is for her;  She still appears sl hoarse to me, scratchy voice which she believes is due to "allergy issues" => rec ENT evaluation for completeness;  She increased the Klonopin to 1mg Bid & this is helping some she says- not as SOB at rest, able to get a deeper breath she thinks, etc;  Using CPAP satis w/ good compliance & download data showing AHI<1/hr on CPAP; she has nasal pillows but wants full face mask & we will let [employer]  know... We reviewed the following medical problems>>     DYSPNEA- multifactorial> ?asthma is a small component w/ restrictive dis due to obesity & scoliosis as a major factor, and CHF, deconditioning, anxiety as additional factors...    OSA>  Mod OSA on sleep study 4/16 w/ AHI=20/hr and REM AHI=54, mod snoring, desaturation to 64%; started on CPAP via [employer], auto 5-15 w/ good compliance => AHI <1/hr on the CPAP autoset 5-15, she wants to change nasal pillows to full face mask, we will check w/ [employer], then do ONO...    Hx asthma> on Symbicort160- 2sp daily, VentolinHFA rescue prn- using 2-3x/wk; PFT 4/16 w/ severe restriction, + small airways dis & min response to bronchodil; asked to continue inhalers for now...    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    Hx HBP> on MetopER25, Apres25- 1.5 tabsTid, Lasix40- 1.5tabsQam, Zaroxyln2.5 prn swelling- taking it almost every day; K=4.2 5/16 at [facility]; Note> allergic to ACE w/ angioedema of lips in past; BP= 130/88 today w/o CP etc...    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    Eating disorder/ Morbid Obesity> unusual hx of eating disorder w/ ANOREXIA as teen=> OBESITY w/ wt=201#, 5' Tall, BMI=39-40; she notes hx anorexia/ depression when in high school w/ lowest wt=80 lbs; psyche rx & counseling resulted in wt gain & she was ~150 lbs in college; gained to 180# after college & lost wt to 165 prior to her marriage at age 19 (she felt better when wt was down); now she is 34 y/o & has gained to max 203#...    Hypothyroidism> on Synthroid50; Labs 12/15 showed TSH=2.62, FreeT4=0.87    Hx IBS & s/p GB in 2013> aware...    Scoliosis & back pain> she was prev evaluated at [facility] by a back specialist, she does not recall the details, no records available, she notes legs get tired, she is not falling, notes that her hands shake on occas... 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    VitB12 defic> Labs showed B12 blood level 179-248 and she is rec to take B12 orally Qd w/ recheck levels...     Anxiety/ depression> on Klonopin  Bid at present... EXAM reveals Afeb, VSS,  O2sat=94% on RA at rest; HEENT- neg, mallampati 2, voice sl hoarse;  Chest- decr BS bilat, clear, decr diaph excursions by percussion, occas sign breaths;  Heart- RRR w/p m/r/g;  Ext- no c/c/e.  LABS 5/16 at [facility] showed Chems- wnl w/ Cr=1.04, BS=83, A1c=5.8;  FLP- at goals on diet alone...  [employer] AirView download> 5/11 - 10/13/14 showed good compliance w/ AHI <1/hr on the CPAP autoset 5-15... PLAN>>  She has a very complex medical situation as outlined above, and we are hampered by lack of old records (I have asked her to  help in getting this data);  For now she will continue the Symbicort, Ventolin, Klonopin, & CPAP; we will have [employer] look into full face mask per her preference; then proceed w/ ONO;  She knows the importance of wt reduction & may want to consider counseling from Dr. Stephania Fragmin for eating disorder;  Continue cardiac meds and f/u w/ DrHarding;  She is only 34 y/o w/ signif pulm restriction & heart disease;  We will XRay her spine in anticipation of getting old records from [place] & poss referral if this is playing a signif roll as well...   ~  December 14, 2014:  12mo ROV & Azriel returns for follow up> stable on her current regimen; she has multifactorial dyspnea>     OSA>  Mod OSA on sleep study 4/16 w/ AHI=20/hr and REM AHI=54, mod snoring, desaturation to 64%; started on CPAP via [employer], auto 5-15 w/ good compliance => AHI <1/hr on the CPAP autoset 5-15, she changed nasal pillows to full face mask, we will check download on FM, then do ONO...    Hx asthma> on Symbicort160- 2sp daily, VentolinHFA rescue prn- using 2-3x/wk; PFT 4/16 w/ severe restriction, + small airways dis & min response to bronchodil; asked to continue inhalers for now...    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    Hx HBP> on MetopER25, Apres25- 1.5 tabsTid, Lasix40- 1.5tabsQam, Zaroxyln2.5 prn  swelling- taking it almost every day; K=4.2 5/16 at [facility]; Note> allergic to ACE w/ angioedema of lips in past; BP= 130/88 today w/o CP etc...    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    Eating disorder/ Morbid Obesity> unusual hx of eating disorder w/ ANOREXIA as teen=> OBESITY w/ wt=204#, 5' Tall, BMI=39-40; she notes hx anorexia/ depression when in high school w/ lowest wt=80 lbs; psyche rx & counseling resulted in wt gain & she was ~150 lbs in college; gained to 180# after college & lost wt to 165 prior to her marriage at age 4 (she felt better when wt was down); now she is 34 y/o & has gained to max 204#... EXAM reveals Afeb, VSS, O2sat=95% on RA at rest; HEENT- neg, mallampati 2, voice sl hoarse;  Chest- decr BS bilat, clear, decr diaph excursions by percussion, occas sign breaths;  Heart- RRR w/p m/r/g;  Ext- no c/c/e. PLAN>>  Marki will soon start cardiac rehab w/ it's exercise & nutritional counseling- it is imperitive that she get the weight down!!!  We will check CPAP download on full face mask & then ONO on the CPAP set up to be sure that she is not desaturating... ROV planned 3 months...   ADDENDUM>>  CPAP download 8/10 - 01/13/15 on autoset 5-15 and now w/ FULL FaceMask showed 90% compliance, ave 5-6H per night, AHI=0.6.Marland KitchenMarland Kitchen Continue same. ADDENDUM>>  ONO done 01/13/15 on CPAP on RA showed O2sat,88% for 25.11min of the 7H study; lowest O2sat=63%; oxygen desat index=51 desat events per hr; she qualifies for  O2 bleed-in at 2L/min for her CPAP unit...   ~   March 17, 2015:  56mo ROV & Cherica is in Cardiac Rehab per DrHarding, sl improved & planning to continue in maintenance program;  She notes that her breathing is "ok" except w/ weather changes, she's been using the O2 bled into her CPAP Qhs; states she awakes at 3-4AM & can't get back to sleep- she remains on Klonopin  Bid, she tried TylenoloPM w/o help, tried her Lorazepam w/o help, we discussed trial of DESYREL25=50Qhs...     She was [facility] 8/31 - 01/06/15 by [facility] w/ CP> known nonischemic cardiomyop w/ EF=40-45%, stress test done by Cards 12/06/14 was neg for ischemia but showed poor exercise tol & hypertensive response; she had CTAngio in ER- neg for PE, CP resolved w/ MS; serial EKG & enz were neg- they added Aldactone and disch for Cards f/u...  She saw DrHarding 01/27/15 => meds adjusted & his note is reviewed, they plan rov in Dec...     She continues to f/u w/ DrKollar wh started Cymbalta for depression- slight improved... EXAM reveals Afeb, VSS, O2sat=97% on RA at rest; HEENT- neg, mallampati 2, voice sl hoarse;  Chest- decr BS bilat, decr chest wall movement and decr diaph excursions by percussion, occas sign breaths, clear;  Heart- RRR w/p m/r/g;  Ext- no c/c/e.  CXR 01/05/15 showed ?left pleural thickening, r/o effusion, no focal airsp dis, scoliosis...  CT Angio Chest 01/05/15 showed mild cardiomeg, no evid of PE, ectasia of Ao measuring 3.7cm, no adenopathy, no consolidation or effusion, min Atx at left base, fusion of several vertebrae, fusion of several post ribs bilat...  LABS 12/2014> K was low at 3.0=> supplemented to norm;  BS in the 120-130 range;  Cr in the 1.1-1.2 range;  Troponins=neg;  BNP=19;  CBC- wnl... IMP/PLAN>>  She has severe pulmonary restriction- obesity, scoliosis, ankylosis;  Weight is down to 187# on diet & cards rehab exercises;  rec to continue current meds and we will try Desyrel 25=50 Qhs for sleep...    Past Medical History  Diagnosis Date    DYSPNEA> restrictive  lung dis, morbid obesity... OSA> Sleep study 08/2014 w/ AHI=20, worse in REM, desat to 64%... Hx anorexia => morbid obesity...    . Chronic diastolic congestive heart failure, NYHA class 2 07/2011    Echo: EF was 25-30%; No Sig CAD on CATH; No significant finding on Cardiac MRI; follow-up Ech0 2014 --  EF of 50%  . Asthma   . Thyroid disease  >>  On Synthroid50, TSH 12/15 = 2.62   . Depression   . Anxiety   . Essential hypertension   . Stomach problems   . Hypertension   . 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 Cardiomyopathy     Unclear etiology. Last EF by echo 40-45% with global hypokinesis, mild LVH possible apical variant hypertrophic cardiomyopathy        B12 Deficiency >> on oral B12 supplement 1010mcg/d;  Labs showed B12 level 178 => 248    Past Surgical History  Procedure Laterality Date  . Cholecystectomy  2013  . Cardiac catheterization  08/2011    Normal Coronaries - LVEDP 32 mmHg  . Transthoracic echocardiogram  07/2011    Moderate concentric LVH, mildly dilated. EF 25-30% no diastolic dysfunction parameters  to suggest elevated LAP  . Cardiac mri  April 2013    No suggestion of a congenital abnormality; EF ~ 52% - no regional wall motion abnormalities., no increased myocardial signal intensity. No perfusion abnormality.  . 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 Normal wall thickness (compared to prior reading of possible apical LVH), grade 2 DD -- with elevated LV filling pressures (however left atrium was read as normal size), normal artery function and size. No no suggestion of pulmonary hypertension    Outpatient Encounter Prescriptions as of 03/17/2015  Medication Sig   . albuterol (PROVENTIL HFA;VENTOLIN HFA) 108 (90 BASE) MCG/ACT inhaler Inhale 2 puffs into the lungs as needed for wheezing or shortness of breath.  . calcium carbonate (TUMS - DOSED IN MG ELEMENTAL CALCIUM) 500 MG chewable tablet Chew 1 tablet by mouth daily as needed for indigestion or heartburn.  . cholestyramine light (PREVALITE) 4 GM/DOSE powder Take 4 g by mouth as needed (DIARRHEA DUE TO BILE ACIDS).   . clobetasol cream (TEMOVATE) 0.05 % Apply 1 application topically as needed (for Sclerosis).   . clonazePAM (KLONOPIN) 1 MG tablet Take 1 tablet (1 mg total) by mouth 2 (two) times daily.  . cyclobenzaprine (FLEXERIL) 5 MG tablet Take 1 tablet (5 mg total) by mouth 3 (three) times daily as needed for muscle spasms.  [name] dicyclomine (BENTYL) 20 MG tablet Take 1 tablet (20 mg total) by mouth as needed for spasms. (Patient taking differently: Take 20 mg by mouth daily as needed for spasms. )  . DULoxetine (CYMBALTA) 60 MG capsule Take 1 capsule (60 mg total) by mouth daily.  [name] esomeprazole (NEXIUM) 40 MG packet Take 40 mg by mouth daily before breakfast.  . hydrALAZINE (APRESOLINE) 50 MG tablet Take 50 mg twice a day (Patient taking differently: Take 50 mg by mouth 2 (two) times daily. )  . ibuprofen (ADVIL,MOTRIN) 200 MG tablet Take 400 mg by mouth every 6 (six) hours as needed for moderate pain.  [name] levothyroxine (SYNTHROID, LEVOTHROID) 75 MCG tablet Take 1 tablet (75 mcg total) by mouth daily.  [name] LORazepam (ATIVAN) 1 MG tablet Take 1 tablet (1 mg total) by mouth 3 (three) times daily as needed for anxiety (or dizziness).  . meclizine (ANTIVERT) 25 MG tablet Take 1 tablet (25 mg total) by mouth 3 (three) times daily as needed for dizziness.  . metolazone (ZAROXOLYN) 2.5 MG tablet Take 2.5 mg by mouth as needed (swelling).   . metoprolol (LOPRESSOR) 50 MG tablet Take 50 mg by mouth daily.   . norethindrone-ethinyl estradiol 1/35 (ALAYCEN 1/35) tablet Take 1 tablet by mouth daily.  . ondansetron  (ZOFRAN) 4 MG tablet Take 1 tablet (4 mg total) by mouth every 6 (six) hours as needed for nausea or vomiting.  . potassium chloride SA (K-DUR,KLOR-CON) 20 MEQ tablet Take 20 mEq by mouth daily.  [name] spironolactone (ALDACTONE) 25 MG tablet Take 0.5 tablets (12.5 mg total) by mouth daily.  . SYMBICORT 160-4.5 MCG/ACT inhaler INHALE 2 PUFFS INTO THE LUNGS DAILY. (Patient taking differently: INHALE 2 PUFFS INTO THE LUNGS TWICE DAILY)  . torsemide (DEMADEX) 20 MG tablet Take 1 tablet (20 mg total) by mouth daily.  . vitamin B-12 (CYANOCOBALAMIN) 1000 MCG tablet Take 1 tablet (1,000 mcg total) by mouth daily.    Allergies  Allergen Reactions  . Lisinopril Swelling  Angioedema facial, mostly just lips, no trouble breathing    Current Medications, Allergies, Past Medical History, Past Surgical History, Family History, and Social History were reviewed in [employer] record.   Review of Systems             All symptoms NEG except where BOLDED >>  Constitutional:  Denies F/C/S, anorexia, unexpected weight change. HEENT:  No HA, visual changes, earache, nasal symptoms, sore throat, hoarseness. Resp:  cough, sputum, hemoptysis; SOB, tightness, wheezing. Cardio:  CP, palpit, DOE, orthopnea, edema. GI:  Denies N/V/D/C or blood in stool; no reflux, abd pain, distention, or gas. GU:  No dysuria, freq, urgency, hematuria, or flank pain. MS:  Denies joint pain, swelling, tenderness, or decr ROM; no neck pain, back pain, etc. Neuro:  No tremors, seizures, dizziness, syncope, weakness, numbness, gait abn. Skin:  No suspicious lesions or skin rash. Heme:  No adenopathy, bruising, bleeding. Psyche: Denies confusion, sleep disturbance, hallucinations, anxiety, depression.   Objective:   Physical Exam    Vital Signs:  Reviewed...  General:  WD, overweight, 34 y/o WF in NAD; alert & oriented; pleasant & cooperative... HEENT:  [place]/AT; Conjunctiva- pink, Sclera- nonicteric, EOM-wnl,  PERRLA, EACs-clear, TMs-wnl; NOSE-clear; THROAT-clear & wnl. Neck:  Supple w/ fair ROM; no JVD; normal carotid impulses w/o bruits; no thyromegaly or nodules palpated; no lymphadenopathy. Chest:  Clear to P & A; without wheezes, rales, or rhonchi heard (decr BS at bases) Heart:  Regular Rhythm; gr1/6 SEM, without rubs or gallops detected. Abdomen:  Obese, soft, nontender- no guarding or rebound; normal bowel sounds; no organomegaly or masses palpated. Ext:  Normal ROM; without deformities or arthritic changes; no varicose veins, +venous insuffic, tr edema;  Pulses intact w/o bruits. Neuro:  No focal neuro deficits, gait normal & balance OK. Derm:  No lesions noted; no rash etc. Lymph:  No cervical, supraclavicular, axillary, or inguinal adenopathy palpated.   Assessment:      DYSPNEA>> multifactorial Restrictive lung dis- felt to be due to obesity & chest wall factors and not parenchymal pulm dis => continue Klonopin 1mg  Bid... OSA w/ mod AHI=20/hr (worse w/ REM & desat to 64%) on sleep study 08/2014 => now on CPAP Auto 5-15 w/ good compliance and AHI<1, ONO showed signif hypoxemia on the CPAP therefore Oxygen added Qhs... Hx eating disorder w/ anorexia => morbid obesity and she may benefit from psyche counseling again... Hx congenital HD w/ surg 1981, HBP, nonischemic cardiomyopathy w/ combined sys/diast CHF- followed & treated by Cards, DrHarding...   PLAN>>  She has a very complex medical situation as outlined above, and we are hampered by lack of old records (I have asked her to help in getting this data);  For now she will continue the Symbicort, Ventolin, Klonopin, & CPAP; She knows the importance of wt reduction & may want to consider counseling from Dr. Stephania Fragmin for eating disorder;  Continue cardiac meds and f/u w/ DrHarding;  She is only 34 y/o w/ signif pulm restriction & heart disease... 8/9> [place] will soon start cardiac rehab w/ it's exercise & nutritional counseling- it is  imperitive that she get the weight down!!!  We will check CPAP download on full face mask & then ONO on the CPAP set up to be sure that she is not desaturating... ROV planned 3 months 11/10> She has severe pulmonary restriction- obesity, scoliosis, ankylosis;  Weight is down to 187# on diet & cards rehab exercises;  rec to continue current meds and  we will try Desyrel 25=50 Qhs for sleep     Plan:     Patient's Medications  New Prescriptions   TRAZODONE (DESYREL) 50 MG TABLET    Take 1/2-1 tablet by mouth in evening as needed for sleep  Previous Medications   CALCIUM CARBONATE (TUMS - DOSED IN MG ELEMENTAL CALCIUM) 500 MG CHEWABLE TABLET    Chew 1 tablet by mouth daily as needed for indigestion or heartburn.   CHOLESTYRAMINE LIGHT (PREVALITE) 4 GM/DOSE POWDER    Take 4 g by mouth as needed (DIARRHEA DUE TO BILE ACIDS).    CLOBETASOL CREAM (TEMOVATE) 0.05 %    Apply 1 application topically as needed (for Sclerosis).    CYCLOBENZAPRINE (FLEXERIL) 5 MG TABLET    Take 1 tablet (5 mg total) by mouth 3 (three) times daily as needed for muscle spasms.   DICYCLOMINE (BENTYL) 20 MG TABLET    Take 1 tablet (20 mg total) by mouth as needed for spasms.   DULOXETINE (CYMBALTA) 60 MG CAPSULE    Take 1 capsule (60 mg total) by mouth daily.   ESOMEPRAZOLE (NEXIUM) 40 MG PACKET    Take 40 mg by mouth daily before breakfast.   HYDRALAZINE (APRESOLINE) 50 MG TABLET    Take 50 mg twice a day   IBUPROFEN (ADVIL,MOTRIN) 200 MG TABLET    Take 400 mg by mouth every 6 (six) hours as needed for moderate pain.   LEVOTHYROXINE (SYNTHROID, LEVOTHROID) 75 MCG TABLET    Take 1 tablet (75 mcg total) by mouth daily.   LORAZEPAM (ATIVAN) 1 MG TABLET    Take 1 tablet (1 mg total) by mouth 3 (three) times daily as needed for anxiety (or dizziness).   MECLIZINE (ANTIVERT) 25 MG TABLET    Take 1 tablet (25 mg total) by mouth 3 (three) times daily as needed for dizziness.   METOLAZONE (ZAROXOLYN) 2.5 MG TABLET    Take 2.5 mg by  mouth as needed (swelling).    METOPROLOL (LOPRESSOR) 50 MG TABLET    Take 50 mg by mouth daily.    NORETHINDRONE-ETHINYL ESTRADIOL 1/35 (ALAYCEN 1/35) TABLET    Take 1 tablet by mouth daily.   ONDANSETRON (ZOFRAN) 4 MG TABLET    Take 1 tablet (4 mg total) by mouth every 6 (six) hours as needed for nausea or vomiting.   POTASSIUM CHLORIDE SA (K-DUR,KLOR-CON) 20 MEQ TABLET    Take 20 mEq by mouth daily.   SPIRONOLACTONE (ALDACTONE) 25 MG TABLET    Take 0.5 tablets (12.5 mg total) by mouth daily.   SYMBICORT 160-4.5 MCG/ACT INHALER    INHALE 2 PUFFS INTO THE LUNGS DAILY.   TORSEMIDE (DEMADEX) 20 MG TABLET    Take 1 tablet (20 mg total) by mouth daily.   VITAMIN B-12 (CYANOCOBALAMIN) 1000 MCG TABLET    Take 1 tablet (1,000 mcg total) by mouth daily.  Modified Medications   Modified Medication Previous Medication   ALBUTEROL (PROVENTIL HFA;VENTOLIN HFA) 108 (90 BASE) MCG/ACT INHALER albuterol (PROVENTIL HFA;VENTOLIN HFA) 108 (90 BASE) MCG/ACT inhaler      Inhale 2 puffs into the lungs as needed for wheezing or shortness of breath.    Inhale 2 puffs into the lungs as needed for wheezing or shortness of breath.   CLONAZEPAM (KLONOPIN) 1 MG TABLET clonazePAM (KLONOPIN) 1 MG tablet      Take 1 tablet (1 mg total) by mouth 2 (two) times daily.    Take 1 tablet (1 mg total) by mouth 2 (two)  times daily.  Discontinued Medications   No medications on file

## 2015-03-17 NOTE — Patient Instructions (Signed)
Today we updated your med list in our EPIC system...    Continue your current medications the same...  We decided to try DESYREL (Trazodone) 25-50mg  at bedtime to help w/ sleeping thru the night...  Keep up the great job w/ diet/ exercise/ and weight loss...  Call for any questions...  Let's plan a follow up visit in 3-79months, sooner if needed for problems.Marland KitchenMarland Kitchen

## 2015-03-18 ENCOUNTER — Encounter (HOSPITAL_COMMUNITY)
Admission: RE | Admit: 2015-03-18 | Discharge: 2015-03-18 | Disposition: A | Discharge status: Home / Self Care | Payer: Commercial Managed Care - PPO | Source: Ambulatory Visit | Attending: Cardiology | Admitting: Cardiology

## 2015-03-18 ENCOUNTER — Encounter (HOSPITAL_COMMUNITY): Payer: Commercial Managed Care - PPO

## 2015-03-18 DIAGNOSIS — I509 Heart failure, unspecified: Secondary | ICD-10-CM | POA: Diagnosis not present

## 2015-03-21 ENCOUNTER — Encounter (HOSPITAL_COMMUNITY)
Admission: RE | Admit: 2015-03-21 | Discharge: 2015-03-21 | Disposition: A | Discharge status: Home / Self Care | Payer: Commercial Managed Care - PPO | Source: Ambulatory Visit | Attending: Cardiology | Admitting: Cardiology

## 2015-03-21 ENCOUNTER — Encounter (HOSPITAL_COMMUNITY): Payer: Commercial Managed Care - PPO

## 2015-03-21 DIAGNOSIS — I509 Heart failure, unspecified: Secondary | ICD-10-CM | POA: Diagnosis not present

## 2015-03-23 ENCOUNTER — Telehealth: Payer: Self-pay | Admitting: Cardiology

## 2015-03-23 ENCOUNTER — Encounter (HOSPITAL_COMMUNITY)
Admission: RE | Admit: 2015-03-23 | Discharge: 2015-03-23 | Disposition: A | Discharge status: Home / Self Care | Payer: Commercial Managed Care - PPO | Source: Ambulatory Visit | Attending: Cardiology | Admitting: Cardiology

## 2015-03-23 ENCOUNTER — Encounter (HOSPITAL_COMMUNITY): Admission: RE | Admit: 2015-03-23 | Payer: Commercial Managed Care - PPO | Source: Ambulatory Visit

## 2015-03-23 ENCOUNTER — Telehealth (HOSPITAL_COMMUNITY): Payer: Self-pay | Admitting: *Deleted

## 2015-03-23 ENCOUNTER — Encounter (HOSPITAL_COMMUNITY): Payer: Commercial Managed Care - PPO

## 2015-03-23 MED ORDER — METOPROLOL TARTRATE 50 MG PO TABS: 50.0000 mg | ORAL_TABLET | Freq: Every day | ORAL | Status: DC

## 2015-03-23 NOTE — Telephone Encounter (Signed)
Spoke to [place] at cardiac rehab Per patient, she has been out of metoprolol since Sunday HEART RATE WAS ELEVATED PRIOR TO STARTING REHAB TODAY - PATIENT WAS SENT HOME- WITHOUT REHAB TODAY. No record of prescription needed. RN e-sent--  Refill prescription #90 x 3 refills carlette states she will notify patient

## 2015-03-23 NOTE — Telephone Encounter (Signed)
Pt called and informed that rehab staff had called and spoke to [employer].  Informed her that pt had ran out of her metoprolol and was not allowed to exercise today heart rate being too high.  Pt advised she make pick up the [facility] at [facility] on [address].  Contact information provided. Keshav Winegar Industrial/product designer, BSN\

## 2015-03-24 ENCOUNTER — Telehealth: Payer: Self-pay | Admitting: *Deleted

## 2015-03-24 NOTE — Telephone Encounter (Signed)
-----   Message from Marykay Lex, MD sent at 03/23/2015  9:50 AM EST ----- Regarding: RE: needs metoprolol phoned in to pharmacy - pt completely out OK --  Jasmine December - can we call in her Toprol 50 mg.  Marykay Lex, MD  ----- Message -----    From: Chelsea Aus, RN    Sent: 03/23/2015   8:14 AM      To: Marykay Lex, MD Subject: needs metoprolol phoned in to pharmacy - pt #  Dr harding  Pt ran out of her metoprolol on Monday.  I believe you may have increased this on the last visit.  As a result of the increase she ran out sooner.  Pt did not have her metoprolol on yesterday nor today,  Pt called the office to request this on yesterday but [facility] had not been [facility].  Pt was not allowed to exercise this morning due to high heart rate at rest 114 slightest activity caused her HR to go to 140-150's.  Please have someone call this in for her today.  [facility] [address]  Thanks so much - side note pt was disappointed that she could not exercise today - which for this pt is a very good thing!!!  Karlene Lineman RN

## 2015-03-24 NOTE — Telephone Encounter (Signed)
Spoke to patient she states she had been taking metoprolol succ 50 mg daily  And not metoprolol tart.  Patient will complete--The tartrate 50 mg twice a day then change to metoprolol succnicate 50 mg daily--  will not chang e at present.  patient has an appointment with heart failure clinic 03/28/15 - will wait to see if dose change is done

## 2015-03-25 ENCOUNTER — Encounter (HOSPITAL_COMMUNITY)
Admission: RE | Admit: 2015-03-25 | Discharge: 2015-03-25 | Disposition: A | Discharge status: Home / Self Care | Payer: Commercial Managed Care - PPO | Source: Ambulatory Visit | Attending: Cardiology | Admitting: Cardiology

## 2015-03-25 ENCOUNTER — Encounter (HOSPITAL_COMMUNITY): Payer: Commercial Managed Care - PPO

## 2015-03-25 DIAGNOSIS — I509 Heart failure, unspecified: Secondary | ICD-10-CM | POA: Diagnosis not present

## 2015-03-28 ENCOUNTER — Encounter (HOSPITAL_COMMUNITY)
Admission: RE | Admit: 2015-03-28 | Discharge: 2015-03-28 | Disposition: A | Discharge status: Home / Self Care | Payer: Commercial Managed Care - PPO | Source: Ambulatory Visit | Attending: Cardiology | Admitting: Cardiology

## 2015-03-28 ENCOUNTER — Encounter (HOSPITAL_COMMUNITY): Payer: Self-pay

## 2015-03-28 ENCOUNTER — Ambulatory Visit (HOSPITAL_COMMUNITY)
Admission: RE | Admit: 2015-03-28 | Discharge: 2015-03-28 | Disposition: A | Discharge status: Home / Self Care | Payer: Commercial Managed Care - PPO | Source: Ambulatory Visit | Attending: Cardiology | Admitting: Cardiology

## 2015-03-28 VITALS — BP 122/76 | HR 80 | Wt 188.8 lb

## 2015-03-28 DIAGNOSIS — I5043 Acute on chronic combined systolic (congestive) and diastolic (congestive) heart failure: Secondary | ICD-10-CM

## 2015-03-28 DIAGNOSIS — I5042 Chronic combined systolic (congestive) and diastolic (congestive) heart failure: Secondary | ICD-10-CM | POA: Diagnosis not present

## 2015-03-28 DIAGNOSIS — I5022 Chronic systolic (congestive) heart failure: Secondary | ICD-10-CM | POA: Insufficient documentation

## 2015-03-28 DIAGNOSIS — J4520 Mild intermittent asthma, uncomplicated: Secondary | ICD-10-CM | POA: Diagnosis not present

## 2015-03-28 DIAGNOSIS — J45909 Unspecified asthma, uncomplicated: Secondary | ICD-10-CM | POA: Diagnosis not present

## 2015-03-28 DIAGNOSIS — I509 Heart failure, unspecified: Secondary | ICD-10-CM | POA: Diagnosis not present

## 2015-03-28 MED ORDER — VALSARTAN 40 MG PO TABS: 40.0000 mg | ORAL_TABLET | Freq: Two times a day (BID) | ORAL | Status: DC

## 2015-03-28 MED ORDER — METOPROLOL SUCCINATE ER 50 MG PO TB24: 50.0000 mg | ORAL_TABLET | Freq: Every day | ORAL | Status: DC

## 2015-03-28 NOTE — Progress Notes (Signed)
Patient ID: Meredith Diaz, female   DOB: 17-Dec-1980, 34 y.o.   MRN: [id number] PCP: Dr. Okey Dupre Cardiology: Dr. Herbie Baltimore HF Cardiology: Dr. Shirlee Latch  34 yo with history of nonischemic cardiomyopathy.  This was diagnosed in 2013.  EF was initially 25-30%.  Cardiac cath at that time showed no significant CAD.  Cardiac MRI showed no late gadoliniuim enhancement.  Since that time, EF has remained low but has improved a bit to 40-45%.  Most recent echo in 7/16 showed EF 40-45% with diffuse hypokinesis.  She also has asthma.  PFTs showed a severe restrictive defect that may be due to body habitus primarily.   She was admitted in 8/16 with chest pain.  CTA chest showed no PE, lung parenchyma looked normal.  She had ETT with poor exercise tolerance but no evidence for ischemia.   Patient is stable symptomatically.  No more chest pain.  She can walk about 100 yards before getting short of breath.  She has mild dyspnea with stairs.  No orthopnea/PND.  No palpitations/lightheadedness.  No wheezing.  She fatigues easily.  She is not on ACEI due to history of angioedema with lisinopril.   ECG (9/16): NSR, nonspecific T wave flattening  Labs (9/16): K 3.2, creatinine 1.11, hgb 14.3  PMH: 1. Chronic systolic CHF: Nonischemic cardiomyopathy.  - EF 25-30% by echo in 2013.  - Cardiac MRI (2013) with EF 52%, no delayed enhancement.  - LHC in 2013 without significant CAD. - 11/15 echo with EF 40-45%.   - 7/16 echo with EF 40-45%, grade II diastolic dysfunction.  - ETT (9/14) with poor exercise tolerance but no ischemia.  2. Asthma: PFTs (4/16) with mild obstruction, severe restriction and severely decreased DLCO => think primarily due to body habitus.  3. Depression 4. Hypothyroidism 5. 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  6. Angioedema with ACEI 7. OSA: CPAP.   SH: Lives in [place], no smoking, rare ETOH, works as Programmer, multimedia.   FH: Father with CAD, grandfather and grandmother with MIs.   No cardiomyopathy.   ROS: All systems reviewed and negative except as per HPI.   Current Outpatient Prescriptions  Medication Sig Dispense Refill  . albuterol (PROVENTIL HFA;VENTOLIN HFA) 108 (90 BASE) MCG/ACT inhaler Inhale 2 puffs into the lungs as needed for wheezing or shortness of breath. 18 g 2  . calcium carbonate (TUMS - DOSED IN MG ELEMENTAL CALCIUM) 500 MG chewable tablet Chew 1 tablet by mouth daily as needed for indigestion or heartburn.    . cholestyramine light (PREVALITE) 4 GM/DOSE powder Take 4 g by mouth as needed (DIARRHEA DUE TO BILE ACIDS).     . clobetasol cream (TEMOVATE) 0.05 % Apply 1 application topically as needed (for Sclerosis).     . clonazePAM (KLONOPIN) 1 MG tablet Take 1 tablet (1 mg total) by mouth 2 (two) times daily. 60 tablet 5  . DULoxetine (CYMBALTA) 60 MG capsule Take 1 capsule (60 mg total) by mouth daily. 30 capsule 6  . esomeprazole (NEXIUM) 40 MG packet Take 40 mg by mouth daily before breakfast. 30 each 1  . levothyroxine (SYNTHROID, LEVOTHROID) 75 MCG tablet Take 1 tablet (75 mcg total) by mouth daily. 30 tablet 3  . LORazepam (ATIVAN) 1 MG tablet Take 1 tablet (1 mg total) by mouth 3 (three) times daily as needed for anxiety (or dizziness). 21 tablet 0  . norethindrone-ethinyl estradiol 1/35 (ALAYCEN 1/35) tablet Take 1 tablet by mouth daily. 1 Package 12  . potassium chloride SA (  K-DUR,KLOR-CON) 20 MEQ tablet Take 20 mEq by mouth daily.    [name] spironolactone (ALDACTONE) 25 MG tablet Take 0.5 tablets (12.5 mg total) by mouth daily. 30 tablet 1  . SYMBICORT 160-4.5 MCG/ACT inhaler INHALE 2 PUFFS INTO THE LUNGS DAILY. (Patient taking differently: INHALE 2 PUFFS INTO THE LUNGS TWICE DAILY) 10.2 Inhaler 9  . torsemide (DEMADEX) 20 MG tablet Take 1 tablet (20 mg total) by mouth daily. 60 tablet 6  . traZODone (DESYREL) 50 MG tablet Take 1/2-1 tablet by mouth in evening as needed for sleep 30 tablet 5  . vitamin B-12 (CYANOCOBALAMIN) 1000 MCG tablet Take 1  tablet (1,000 mcg total) by mouth daily. 30 tablet 6  . cyclobenzaprine (FLEXERIL) 5 MG tablet Take 1 tablet (5 mg total) by mouth 3 (three) times daily as needed for muscle spasms. (Patient not taking: Reported on 03/28/2015) 60 tablet 1  . dicyclomine (BENTYL) 20 MG tablet Take 1 tablet (20 mg total) by mouth as needed for spasms. (Patient not taking: Reported on 03/28/2015) 60 tablet 3  . ibuprofen (ADVIL,MOTRIN) 200 MG tablet Take 400 mg by mouth every 6 (six) hours as needed for moderate pain.    . meclizine (ANTIVERT) 25 MG tablet Take 1 tablet (25 mg total) by mouth 3 (three) times daily as needed for dizziness. (Patient not taking: Reported on 03/28/2015) 30 tablet 0  . metolazone (ZAROXOLYN) 2.5 MG tablet Take 2.5 mg by mouth as needed (swelling).     . metoprolol succinate (TOPROL-XL) 50 MG 24 hr tablet Take 1 tablet (50 mg total) by mouth daily. Take with or immediately following a meal. 90 tablet 3  . ondansetron (ZOFRAN) 4 MG tablet Take 1 tablet (4 mg total) by mouth every 6 (six) hours as needed for nausea or vomiting. (Patient not taking: Reported on 03/28/2015) 20 tablet 0  . valsartan (DIOVAN) 40 MG tablet Take 1 tablet (40 mg total) by mouth 2 (two) times daily. 60 tablet 6   No current facility-administered medications for this encounter.   BP 122/76 mmHg  Pulse 80  Wt 188 lb 12 oz (85.616 kg)  SpO2 98% General: NAD Neck: No JVD, no thyromegaly or thyroid nodule.  Lungs: Clear to auscultation bilaterally with normal respiratory effort. CV: Nondisplaced PMI.  Heart regular S1/S2, no S3/S4, no murmur.  No peripheral edema.  No carotid bruit.  Normal pedal pulses.  Abdomen: Soft, nontender, no hepatosplenomegaly, no distention.  Skin: Intact without lesions or rashes.  Neurologic: Alert and oriented x 3.  Psych: Normal affect. Extremities: No clubbing or cyanosis.  HEENT: Normal.   Assessment/Plan: 1. Chronic systolic CHF: Nonischemic cardiomyopathy.  No CAD on prior  cath, prior cMRI showed no late gadolinium enhancement.  No family history of cardiomyopathy.  No ETOH/substance abuse.  No uncontrolled HTN.  Possible that she had viral myocarditis. NYHA class II symptoms.  Euvolemic on exam.  - She is taking metoprolol tartrate 50 mg once daily.  Stop this and start Toprol XL 50 mg daily.  - Continue spironolactone.  - Stop hydralazine.  Start valsartan 40 mg bid.  She had angioedema with ACEI in the past (not recently).  The risk of angioedema with ARB in patients who had angioedema with ACEI is small.  I think that she would benefit from ARB from the standpoint of her cardiomyopathy.  We discussed the risks and benefits.  She remembers the symptoms of angioedema and knows to stop valsartan and seek medical care if she develops the symptoms.  -  BMET/BNP in 2 wks.  - I will arrange for CPX for functional assessment.  2. Asthma: No active wheezing.   Marca Ancona 03/28/2015

## 2015-03-28 NOTE — Patient Instructions (Signed)
Stop Hydralazine  Stop Lopressor  Start Valasartan 40 mg twice a day Start Metoprolol XL 50 mg daily  Cardiopulmonary exercise   Labs in 2 weeks  2 month follow up

## 2015-03-29 ENCOUNTER — Other Ambulatory Visit: Payer: Self-pay | Admitting: Cardiology

## 2015-03-29 ENCOUNTER — Other Ambulatory Visit: Payer: Self-pay | Admitting: Internal Medicine

## 2015-03-30 ENCOUNTER — Encounter (HOSPITAL_COMMUNITY)
Admission: RE | Admit: 2015-03-30 | Discharge: 2015-03-30 | Disposition: A | Discharge status: Home / Self Care | Payer: Commercial Managed Care - PPO | Source: Ambulatory Visit | Attending: Cardiology | Admitting: Cardiology

## 2015-03-30 DIAGNOSIS — I509 Heart failure, unspecified: Secondary | ICD-10-CM | POA: Diagnosis not present

## 2015-04-04 ENCOUNTER — Encounter (HOSPITAL_COMMUNITY)
Admission: RE | Admit: 2015-04-04 | Discharge: 2015-04-04 | Disposition: A | Discharge status: Home / Self Care | Payer: Commercial Managed Care - PPO | Source: Ambulatory Visit | Attending: Cardiology | Admitting: Cardiology

## 2015-04-04 DIAGNOSIS — I509 Heart failure, unspecified: Secondary | ICD-10-CM | POA: Diagnosis not present

## 2015-04-05 ENCOUNTER — Ambulatory Visit (HOSPITAL_COMMUNITY): Payer: Commercial Managed Care - PPO

## 2015-04-06 ENCOUNTER — Encounter (HOSPITAL_COMMUNITY)
Admission: RE | Admit: 2015-04-06 | Discharge: 2015-04-06 | Disposition: A | Discharge status: Home / Self Care | Payer: Commercial Managed Care - PPO | Source: Ambulatory Visit | Attending: Cardiology | Admitting: Cardiology

## 2015-04-06 DIAGNOSIS — I509 Heart failure, unspecified: Secondary | ICD-10-CM | POA: Diagnosis not present

## 2015-04-08 ENCOUNTER — Encounter (HOSPITAL_COMMUNITY)
Admission: RE | Admit: 2015-04-08 | Discharge: 2015-04-08 | Disposition: A | Discharge status: Home / Self Care | Payer: Commercial Managed Care - PPO | Source: Ambulatory Visit | Attending: Cardiology | Admitting: Cardiology

## 2015-04-08 DIAGNOSIS — I509 Heart failure, unspecified: Secondary | ICD-10-CM | POA: Insufficient documentation

## 2015-04-08 NOTE — Progress Notes (Signed)
Pt graduated from cardiac rehab program today with completion of 36 exercise sessions in Phase II. Pt maintained good attendance to exercise and education. Pt progressed in an excellent fashion during her participation in rehab as evidenced by increased MET level. Pt MET level increased from 2.6 to 3.5. Psychological Assessment  Pt was very depressed due to the chronic nature of heart failure.  Pt would become tearful at the thought of the poor prognosis associated with cardiac rehab. Pt has since established her heart care with Dr.Mclean at the Heart failure clinic.  Pt feels good about this transition of care Pt made significant increase with Quality of Life survey.Pt post QOL survey results shows almost double increase from how she responded at the beginning of her exercise participation in cardiac rehab.  Pt overall score increased from 14.58 to 24.70, Health and function increased from 11.7 to 25.0, socioeconomic increased from 18.93 to 24.86, Psychological and Spiritual 12.33 to 23.50 and Family 20.88 to 25.13.  Pt has improved outlook on life, feels positive about her future. Pt has started keeping a gratitude journal where she will daily write in things that she is thankful  For. Pt no longer feels feels scared and anxious about exercise.  Pt feels empowered because of the significant weight loss.  Pt lost 20 pounds during her 36 sessions in cardiac rehab.  Pt typically describes herself as socially awkward and meek.  However on her last day of exercise pt felt confident to lead the stretches for warm up for the entire 6:45 class.  Medication list reconciled. Repeat  PHQ score- decreased to 4  .  Pt has made significant lifestyle changes and should be commended for her success both physical abd emotional. Pt feels he has achieved her goals during cardiac rehab.   Pt plans to continue exercise in cardiac maintenance program. Pt plans to start this in the beginning of the year due to work trip travel and  family travel for the holidays.  Pt advised to keep the momentum going and to remain active during this interim.  Pt will have CPX on Tuesday.  It was truly inspiring to have her participate in cardiac rehab and we look forward to working with her in the maintenance program.  Maurice Small RN, BSN

## 2015-04-11 ENCOUNTER — Encounter (HOSPITAL_COMMUNITY): Payer: Commercial Managed Care - PPO

## 2015-04-12 ENCOUNTER — Ambulatory Visit: Payer: Commercial Managed Care - PPO | Admitting: Cardiology

## 2015-04-12 ENCOUNTER — Ambulatory Visit (HOSPITAL_COMMUNITY): Payer: Commercial Managed Care - PPO | Attending: Internal Medicine

## 2015-04-12 DIAGNOSIS — I5043 Acute on chronic combined systolic (congestive) and diastolic (congestive) heart failure: Secondary | ICD-10-CM | POA: Insufficient documentation

## 2015-04-13 ENCOUNTER — Encounter (HOSPITAL_COMMUNITY): Payer: Commercial Managed Care - PPO

## 2015-04-13 ENCOUNTER — Other Ambulatory Visit (HOSPITAL_COMMUNITY): Payer: Commercial Managed Care - PPO

## 2015-04-13 ENCOUNTER — Other Ambulatory Visit (HOSPITAL_COMMUNITY): Payer: Self-pay | Admitting: *Deleted

## 2015-04-13 DIAGNOSIS — I5043 Acute on chronic combined systolic (congestive) and diastolic (congestive) heart failure: Secondary | ICD-10-CM

## 2015-04-15 ENCOUNTER — Other Ambulatory Visit (HOSPITAL_COMMUNITY): Payer: Commercial Managed Care - PPO

## 2015-04-15 ENCOUNTER — Encounter (HOSPITAL_COMMUNITY): Payer: Commercial Managed Care - PPO

## 2015-04-18 ENCOUNTER — Encounter (HOSPITAL_COMMUNITY): Payer: Commercial Managed Care - PPO

## 2015-04-18 ENCOUNTER — Ambulatory Visit (HOSPITAL_COMMUNITY)
Admission: RE | Admit: 2015-04-18 | Discharge: 2015-04-18 | Disposition: A | Discharge status: Home / Self Care | Payer: Commercial Managed Care - PPO | Source: Ambulatory Visit | Attending: Cardiology | Admitting: Cardiology

## 2015-04-18 DIAGNOSIS — I5043 Acute on chronic combined systolic (congestive) and diastolic (congestive) heart failure: Secondary | ICD-10-CM | POA: Insufficient documentation

## 2015-04-18 LAB — BASIC METABOLIC PANEL
ANION GAP: 11 (ref 5–15)
BUN: 16 mg/dL (ref 6–20)
CHLORIDE: 96 mmol/L — AB (ref 101–111)
CO2: 30 mmol/L (ref 22–32)
Calcium: 8.7 mg/dL — ABNORMAL LOW (ref 8.9–10.3)
Creatinine, Ser: 0.96 mg/dL (ref 0.44–1.00)
GFR calc Af Amer: >60 mL/min (ref >60)
GLUCOSE: 98 mg/dL (ref 65–99)
POTASSIUM: 3.3 mmol/L — AB (ref 3.5–5.1)
Sodium: 137 mmol/L (ref 135–145)

## 2015-04-18 LAB — BRAIN NATRIURETIC PEPTIDE: B Natriuretic Peptide: 22.8 pg/mL (ref 0.0–100.0)

## 2015-04-20 ENCOUNTER — Encounter (HOSPITAL_COMMUNITY): Payer: Commercial Managed Care - PPO

## 2015-04-20 ENCOUNTER — Telehealth (HOSPITAL_COMMUNITY): Payer: Self-pay | Admitting: *Deleted

## 2015-04-20 MED ORDER — POTASSIUM CHLORIDE CRYS ER 20 MEQ PO TBCR: 40.0000 meq | EXTENDED_RELEASE_TABLET | Freq: Every day | ORAL | Status: DC

## 2015-04-20 NOTE — Telephone Encounter (Signed)
Notes Recorded by Noralee Space, RN on 04/20/2015 at 4:51 PM Pt aware and agreeable, rx sent in

## 2015-04-20 NOTE — Telephone Encounter (Signed)
-----   Message from Laurey Morale, MD sent at 04/19/2015  4:28 PM EST ----- Needs to add KCl 20 mEq daily additionally to her regimen.

## 2015-04-22 ENCOUNTER — Encounter (HOSPITAL_COMMUNITY): Admission: RE | Admit: 2015-04-22 | Payer: Commercial Managed Care - PPO | Source: Ambulatory Visit

## 2015-05-23 ENCOUNTER — Encounter (HOSPITAL_COMMUNITY)
Admission: RE | Admit: 2015-05-23 | Discharge: 2015-05-23 | Disposition: A | Discharge status: Home / Self Care | Payer: Self-pay | Source: Ambulatory Visit | Attending: Cardiology | Admitting: Cardiology

## 2015-05-23 DIAGNOSIS — I5043 Acute on chronic combined systolic (congestive) and diastolic (congestive) heart failure: Secondary | ICD-10-CM | POA: Insufficient documentation

## 2015-05-25 ENCOUNTER — Encounter (HOSPITAL_COMMUNITY)
Admission: RE | Admit: 2015-05-25 | Discharge: 2015-05-25 | Disposition: A | Discharge status: Home / Self Care | Payer: Self-pay | Source: Ambulatory Visit | Attending: Cardiology | Admitting: Cardiology

## 2015-05-26 ENCOUNTER — Ambulatory Visit (HOSPITAL_COMMUNITY)
Admission: RE | Admit: 2015-05-26 | Discharge: 2015-05-26 | Disposition: A | Discharge status: Home / Self Care | Payer: Commercial Managed Care - PPO | Source: Ambulatory Visit | Attending: Cardiology | Admitting: Cardiology

## 2015-05-26 VITALS — BP 98/58 | HR 96 | Wt 189.5 lb

## 2015-05-26 DIAGNOSIS — Z8249 Family history of ischemic heart disease and other diseases of the circulatory system: Secondary | ICD-10-CM | POA: Insufficient documentation

## 2015-05-26 DIAGNOSIS — G4733 Obstructive sleep apnea (adult) (pediatric): Secondary | ICD-10-CM | POA: Diagnosis not present

## 2015-05-26 DIAGNOSIS — J984 Other disorders of lung: Secondary | ICD-10-CM | POA: Diagnosis not present

## 2015-05-26 DIAGNOSIS — Z79899 Other long term (current) drug therapy: Secondary | ICD-10-CM | POA: Insufficient documentation

## 2015-05-26 DIAGNOSIS — J45909 Unspecified asthma, uncomplicated: Secondary | ICD-10-CM | POA: Insufficient documentation

## 2015-05-26 DIAGNOSIS — I5042 Chronic combined systolic (congestive) and diastolic (congestive) heart failure: Secondary | ICD-10-CM | POA: Diagnosis not present

## 2015-05-26 DIAGNOSIS — I428 Other cardiomyopathies: Secondary | ICD-10-CM | POA: Diagnosis not present

## 2015-05-26 DIAGNOSIS — E039 Hypothyroidism, unspecified: Secondary | ICD-10-CM | POA: Insufficient documentation

## 2015-05-26 DIAGNOSIS — F329 Major depressive disorder, single episode, unspecified: Secondary | ICD-10-CM | POA: Insufficient documentation

## 2015-05-26 LAB — BASIC METABOLIC PANEL
ANION GAP: 10 (ref 5–15)
BUN: 18 mg/dL (ref 6–20)
CALCIUM: 9.5 mg/dL (ref 8.9–10.3)
CO2: 26 mmol/L (ref 22–32)
Chloride: 102 mmol/L (ref 101–111)
Creatinine, Ser: 0.89 mg/dL (ref 0.44–1.00)
GFR calc Af Amer: >60 mL/min (ref >60)
GFR calc non Af Amer: >60 mL/min (ref >60)
GLUCOSE: 100 mg/dL — AB (ref 65–99)
Potassium: 4.4 mmol/L (ref 3.5–5.1)
Sodium: 138 mmol/L (ref 135–145)

## 2015-05-26 MED ORDER — SPIRONOLACTONE 25 MG PO TABS: 25.0000 mg | ORAL_TABLET | Freq: Every day | ORAL | Status: DC

## 2015-05-26 NOTE — Progress Notes (Signed)
Patient ID: Meredith Diaz, female   DOB: Sep 09, 1980, 35 y.o.   MRN: [id number] PCP: Dr. Okey Dupre HF Cardiology: Dr. Shirlee Latch  35 yo with history of nonischemic cardiomyopathy.  This was diagnosed in 2013.  EF was initially 25-30%.  Cardiac cath at that time showed no significant CAD.  Cardiac MRI showed no late gadoliniuim enhancement.  Since that time, EF has remained low but has improved a bit to 40-45%.  Most recent echo in 7/16 showed EF 40-45% with diffuse hypokinesis.  She also has asthma.  PFTs showed a severe restrictive defect that may be due to body habitus primarily.   She was admitted in 8/16 with chest pain.  CTA chest showed no PE, lung parenchyma looked normal.  She had ETT with poor exercise tolerance but no evidence for ischemia.  CPX was done in 12/16, showing moderate functional limitation due primarily to lung restriction.   Patient is stable symptomatically.  No chest pain.  She can walk about 100 yards before getting short of breath. She has mild dyspnea with stairs.  No orthopnea/PND.  No palpitations/lightheadedness.  Occasional wheezing, especially with weather changes.  She fatigues easily.  She is doing cardiac rehab.  SBP tends to run in the 100s.   Labs (9/16): K 3.2, creatinine 1.11, hgb 14.3 Labs (12/16): K 3.3, creatinine 0.96, BNP 23  PMH: 1. Chronic systolic CHF: Nonischemic cardiomyopathy.  - EF 25-30% by echo in 2013.  - Cardiac MRI (2013) with EF 52%, no delayed enhancement.  - LHC in 2013 without significant CAD. - 11/15 echo with EF 40-45%.   - 7/16 echo with EF 40-45%, grade II diastolic dysfunction.  - ETT (6/07) with poor exercise tolerance but no ischemia.  - CPX (12/16): peak VO2 15.7 (23.5 adjusted for ideal body weight), VE/VCO2 slope 21, RER 1.15 => moderate functional limitation thought to be primarily due to restrictive lung physiology.  2. Asthma: PFTs (4/16) with mild obstruction, severe restriction and severely decreased DLCO => think primarily  due to body habitus.  3. Depression 4. Hypothyroidism 5. 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  6. Angioedema with ACEI 7. OSA: CPAP.   SH: Lives in [place], no smoking, rare ETOH, works as Programmer, multimedia.   FH: Father with CAD, grandfather and grandmother with MIs.  No cardiomyopathy.   ROS: All systems reviewed and negative except as per HPI.   Current Outpatient Prescriptions  Medication Sig Dispense Refill  . albuterol (PROVENTIL HFA;VENTOLIN HFA) 108 (90 BASE) MCG/ACT inhaler Inhale 2 puffs into the lungs as needed for wheezing or shortness of breath. 18 g 2  . calcium carbonate (TUMS - DOSED IN MG ELEMENTAL CALCIUM) 500 MG chewable tablet Chew 1 tablet by mouth daily as needed for indigestion or heartburn.    . cholestyramine light (PREVALITE) 4 GM/DOSE powder Take 4 g by mouth as needed (DIARRHEA DUE TO BILE ACIDS).     . clobetasol cream (TEMOVATE) 0.05 % Apply 1 application topically as needed (for Sclerosis).     . clonazePAM (KLONOPIN) 1 MG tablet Take 1 tablet (1 mg total) by mouth 2 (two) times daily. 60 tablet 5  . dicyclomine (BENTYL) 20 MG tablet Take 1 tablet (20 mg total) by mouth as needed for spasms. 60 tablet 3  . DULoxetine (CYMBALTA) 60 MG capsule Take 1 capsule (60 mg total) by mouth daily. 30 capsule 6  . ibuprofen (ADVIL,MOTRIN) 200 MG tablet Take 400 mg by mouth every 6 (six) hours as needed for moderate  pain.    . levothyroxine (SYNTHROID, LEVOTHROID) 75 MCG tablet Take 1 tablet (75 mcg total) by mouth daily. 30 tablet 3  . LORazepam (ATIVAN) 1 MG tablet Take 1 tablet (1 mg total) by mouth 3 (three) times daily as needed for anxiety (or dizziness). 21 tablet 0  . metolazone (ZAROXOLYN) 2.5 MG tablet Take 2.5 mg by mouth as needed (swelling). Reported on 05/23/2015    . metoprolol succinate (TOPROL-XL) 50 MG 24 hr tablet Take 1 tablet (50 mg total) by mouth daily. Take with or immediately following a meal. 90 tablet 3  . norethindrone-ethinyl  estradiol 1/35 (ALAYCEN 1/35) tablet Take 1 tablet by mouth daily. 1 Package 12  . potassium chloride SA (K-DUR,KLOR-CON) 20 MEQ tablet Take 2 tablets (40 mEq total) by mouth daily. 60 tablet 6  . spironolactone (ALDACTONE) 25 MG tablet Take 1 tablet (25 mg total) by mouth daily. 30 tablet 6  . SYMBICORT 160-4.5 MCG/ACT inhaler INHALE 2 PUFFS INTO THE LUNGS DAILY. (Patient taking differently: INHALE 2 PUFFS INTO THE LUNGS TWICE DAILY) 10.2 Inhaler 9  . torsemide (DEMADEX) 20 MG tablet Take 1 tablet (20 mg total) by mouth daily. 60 tablet 6  . traZODone (DESYREL) 50 MG tablet Take 1/2-1 tablet by mouth in evening as needed for sleep 30 tablet 5  . valsartan (DIOVAN) 40 MG tablet Take 1 tablet (40 mg total) by mouth 2 (two) times daily. 60 tablet 6  . vitamin B-12 (CYANOCOBALAMIN) 1000 MCG tablet Take 1 tablet (1,000 mcg total) by mouth daily. 30 tablet 6   No current facility-administered medications for this encounter.   BP 98/58 mmHg  Pulse 96  Wt 189 lb 8 oz (85.957 kg)  SpO2 93% General: NAD Neck: No JVD, no thyromegaly or thyroid nodule.  Lungs: Clear to auscultation bilaterally with normal respiratory effort. CV: Nondisplaced PMI.  Heart regular S1/S2, no S3/S4, no murmur.  No peripheral edema.  No carotid bruit.  Normal pedal pulses.  Abdomen: Soft, nontender, no hepatosplenomegaly, no distention.  Skin: Intact without lesions or rashes.  Neurologic: Alert and oriented x 3.  Psych: Normal affect. Extremities: No clubbing or cyanosis.  HEENT: Normal.   Assessment/Plan: 1. Chronic systolic CHF: Nonischemic cardiomyopathy.  No CAD on prior cath, prior cMRI showed no late gadolinium enhancement.  No family history of cardiomyopathy.  No ETOH/substance abuse.  No uncontrolled HTN.  Possible that she had viral myocarditis. CPX showed moderate functional limitation but this seems to be primarily due to lung restriction (probably from body habitus).  NYHA class II symptoms.  Euvolemic on  exam.  - Continue Toprol XL 50 mg daily.  - Increase spironolactone to 25 mg daily with BMET today and again in 2 wks.  - Continue valsartan 40 mg bid.  She had angioedema with ACEI in the past (not recently).  The risk of angioedema with ARB in patients who had angioedema with ACEI is small.  I think that she benefits from ARB from the standpoint of her cardiomyopathy.  We have discussed the risks and benefits.  She remembers the symptoms of angioedema and knows to stop valsartan and seek medical care if she develops the symptoms.  2. Asthma: No active wheezing.  3. Restrictive PFTs: Suspect primarily due to body habitus.   Marca Ancona 05/26/2015

## 2015-05-26 NOTE — Patient Instructions (Signed)
Increase Spironolactone to 25 mg (1 tab) daily  Labs today  Labs in 2 weeks  We will contact you in 3 months to schedule your next appointment.

## 2015-05-27 ENCOUNTER — Encounter (HOSPITAL_COMMUNITY)
Admission: RE | Admit: 2015-05-27 | Discharge: 2015-05-27 | Disposition: A | Discharge status: Home / Self Care | Payer: Self-pay | Source: Ambulatory Visit | Attending: Cardiology | Admitting: Cardiology

## 2015-05-30 ENCOUNTER — Encounter (HOSPITAL_COMMUNITY): Admission: RE | Admit: 2015-05-30 | Payer: Self-pay | Source: Ambulatory Visit

## 2015-06-01 ENCOUNTER — Other Ambulatory Visit: Payer: Self-pay | Admitting: Internal Medicine

## 2015-06-01 ENCOUNTER — Encounter (HOSPITAL_COMMUNITY): Payer: Self-pay

## 2015-06-03 ENCOUNTER — Encounter (HOSPITAL_COMMUNITY)
Admission: RE | Admit: 2015-06-03 | Discharge: 2015-06-03 | Disposition: A | Discharge status: Home / Self Care | Payer: Self-pay | Source: Ambulatory Visit | Attending: Cardiology | Admitting: Cardiology

## 2015-06-06 ENCOUNTER — Encounter (HOSPITAL_COMMUNITY)
Admission: RE | Admit: 2015-06-06 | Discharge: 2015-06-06 | Disposition: A | Discharge status: Home / Self Care | Payer: Self-pay | Source: Ambulatory Visit | Attending: Cardiology | Admitting: Cardiology

## 2015-06-08 ENCOUNTER — Encounter (HOSPITAL_COMMUNITY)
Admission: RE | Admit: 2015-06-08 | Discharge: 2015-06-08 | Disposition: A | Discharge status: Home / Self Care | Payer: Self-pay | Source: Ambulatory Visit | Attending: Cardiology | Admitting: Cardiology

## 2015-06-08 DIAGNOSIS — I5043 Acute on chronic combined systolic (congestive) and diastolic (congestive) heart failure: Secondary | ICD-10-CM | POA: Insufficient documentation

## 2015-06-10 ENCOUNTER — Ambulatory Visit (HOSPITAL_COMMUNITY)
Admission: RE | Admit: 2015-06-10 | Discharge: 2015-06-10 | Disposition: A | Discharge status: Home / Self Care | Payer: Commercial Managed Care - PPO | Source: Ambulatory Visit | Attending: Cardiology | Admitting: Cardiology

## 2015-06-10 ENCOUNTER — Encounter (HOSPITAL_COMMUNITY)
Admission: RE | Admit: 2015-06-10 | Discharge: 2015-06-10 | Disposition: A | Discharge status: Home / Self Care | Payer: Self-pay | Source: Ambulatory Visit | Attending: Cardiology | Admitting: Cardiology

## 2015-06-10 DIAGNOSIS — I5042 Chronic combined systolic (congestive) and diastolic (congestive) heart failure: Secondary | ICD-10-CM

## 2015-06-10 LAB — BASIC METABOLIC PANEL
Anion gap: 10 (ref 5–15)
BUN: 18 mg/dL (ref 6–20)
CALCIUM: 9.3 mg/dL (ref 8.9–10.3)
CO2: 24 mmol/L (ref 22–32)
CREATININE: 0.95 mg/dL (ref 0.44–1.00)
Chloride: 104 mmol/L (ref 101–111)
GFR calc non Af Amer: >60 mL/min (ref >60)
GLUCOSE: 92 mg/dL (ref 65–99)
Potassium: 4.7 mmol/L (ref 3.5–5.1)
Sodium: 138 mmol/L (ref 135–145)

## 2015-06-13 ENCOUNTER — Encounter (HOSPITAL_COMMUNITY)
Admission: RE | Admit: 2015-06-13 | Discharge: 2015-06-13 | Disposition: A | Discharge status: Home / Self Care | Payer: Self-pay | Source: Ambulatory Visit | Attending: Cardiology | Admitting: Cardiology

## 2015-06-15 ENCOUNTER — Other Ambulatory Visit (INDEPENDENT_AMBULATORY_CARE_PROVIDER_SITE_OTHER): Payer: Commercial Managed Care - PPO

## 2015-06-15 ENCOUNTER — Encounter (HOSPITAL_COMMUNITY): Payer: Self-pay

## 2015-06-15 ENCOUNTER — Ambulatory Visit (INDEPENDENT_AMBULATORY_CARE_PROVIDER_SITE_OTHER): Payer: Commercial Managed Care - PPO | Admitting: Internal Medicine

## 2015-06-15 ENCOUNTER — Encounter: Payer: Self-pay | Admitting: Internal Medicine

## 2015-06-15 VITALS — BP 108/70 | HR 85 | Temp 98.0°F | Resp 16 | Ht 60.0 in | Wt 188.8 lb

## 2015-06-15 DIAGNOSIS — M4120 Other idiopathic scoliosis, site unspecified: Secondary | ICD-10-CM

## 2015-06-15 DIAGNOSIS — E538 Deficiency of other specified B group vitamins: Secondary | ICD-10-CM

## 2015-06-15 DIAGNOSIS — E039 Hypothyroidism, unspecified: Secondary | ICD-10-CM | POA: Diagnosis not present

## 2015-06-15 DIAGNOSIS — D72829 Elevated white blood cell count, unspecified: Secondary | ICD-10-CM

## 2015-06-15 DIAGNOSIS — I5042 Chronic combined systolic (congestive) and diastolic (congestive) heart failure: Secondary | ICD-10-CM

## 2015-06-15 DIAGNOSIS — F4323 Adjustment disorder with mixed anxiety and depressed mood: Secondary | ICD-10-CM

## 2015-06-15 DIAGNOSIS — M25551 Pain in right hip: Secondary | ICD-10-CM

## 2015-06-15 DIAGNOSIS — R0600 Dyspnea, unspecified: Secondary | ICD-10-CM

## 2015-06-15 LAB — TSH: TSH: 2.19 u[IU]/mL (ref 0.35–4.50)

## 2015-06-15 LAB — CBC
HCT: 38.6 % (ref 36.0–46.0)
Hemoglobin: 12.9 g/dL (ref 12.0–15.0)
MCHC: 33.3 g/dL (ref 30.0–36.0)
MCV: 89.2 fl (ref 78.0–100.0)
Platelets: 474 10*3/uL — ABNORMAL HIGH (ref 150.0–400.0)
RBC: 4.33 Mil/uL (ref 3.87–5.11)
RDW: 13 % (ref 11.5–15.5)
WBC: 17.1 10*3/uL — AB (ref 4.0–10.5)

## 2015-06-15 LAB — HEMOGLOBIN A1C: Hgb A1c MFr Bld: 5.6 % (ref 4.6–6.5)

## 2015-06-15 LAB — VITAMIN B12: Vitamin B-12: 313 pg/mL (ref 211–911)

## 2015-06-15 LAB — T4, FREE: FREE T4: 0.87 ng/dL (ref 0.60–1.60)

## 2015-06-15 NOTE — Progress Notes (Signed)
Pre visit review using our clinic review tool, if applicable. No additional management support is needed unless otherwise documented below in the visit note. 

## 2015-06-15 NOTE — Patient Instructions (Signed)
For the Ob/Gyn we recommend [facility] or [facility]. Both groups have a website with pictures of their providers.   We will check the labs today and call you back about the results.   Try to spend more time on yourself doing things that make you happy and relaxed.   We will get you in with a doctor that can help evaluate the scoliosis and the hip and I will coordinate with Dr. Kriste Basque.

## 2015-06-16 ENCOUNTER — Encounter (HOSPITAL_COMMUNITY)
Admission: RE | Admit: 2015-06-16 | Discharge: 2015-06-16 | Disposition: A | Discharge status: Home / Self Care | Payer: Self-pay | Source: Ambulatory Visit | Attending: Cardiology | Admitting: Cardiology

## 2015-06-17 ENCOUNTER — Other Ambulatory Visit: Payer: Self-pay | Admitting: Internal Medicine

## 2015-06-17 ENCOUNTER — Encounter (HOSPITAL_COMMUNITY)
Admission: RE | Admit: 2015-06-17 | Discharge: 2015-06-17 | Disposition: A | Discharge status: Home / Self Care | Payer: Self-pay | Source: Ambulatory Visit | Attending: Cardiology | Admitting: Cardiology

## 2015-06-17 DIAGNOSIS — D72829 Elevated white blood cell count, unspecified: Secondary | ICD-10-CM | POA: Insufficient documentation

## 2015-06-17 MED ORDER — LEVOTHYROXINE SODIUM 88 MCG PO TABS: 88.0000 ug | ORAL_TABLET | Freq: Every day | ORAL | Status: DC

## 2015-06-17 NOTE — Progress Notes (Signed)
   Subjective:    Patient ID: Meredith Diaz, female    DOB: [birth year], 35 y.o.   MRN: [id number]  HPI The patient is a 35 YO female with complicated PMH coming in for follow up of several problems including her depression/anxiety (not doing well lately, more panic attacks, taking cymbalta and klonopin with okay results, not more stress recently, exercising with the cardiopulmonary rehab), her SOB (has seen cardiac and pulmonary and no good answers for it although she does have CHF and possible asthma but there is some component that could be restrictive from her scoliosis). She is also more fatigued lately and no energy (she has hypothyroidism and exercising more lately, no other changes to meds). Overall she does not feel sick and has no cold symptoms.   Review of Systems  Constitutional: Positive for activity change and fatigue. Negative for fever, chills, appetite change and unexpected weight change.  HENT: Negative.   Eyes: Negative.   Respiratory: Positive for shortness of breath. Negative for cough, chest tightness and wheezing.   Cardiovascular: Negative for chest pain, palpitations and leg swelling.  Gastrointestinal: Negative for nausea, diarrhea, constipation, blood in stool and abdominal distention.  Musculoskeletal: Positive for myalgias, back pain and arthralgias. Negative for gait problem.  Neurological: Positive for weakness. Negative for dizziness, numbness and headaches.  Psychiatric/Behavioral: Positive for dysphoric mood and decreased concentration. Negative for suicidal ideas and self-injury. The patient is nervous/anxious.       Objective:   Physical Exam  Constitutional: She is oriented to person, place, and time. She appears well-developed and well-nourished. No distress.  Overweight  HENT:  Head: Normocephalic and atraumatic.  Right Ear: External ear normal.  Left Ear: External ear normal.  Mouth/Throat: Oropharynx is clear and moist.  Eyes: EOM are normal.    Neck: Normal range of motion.  Cardiovascular: Normal rate and regular rhythm.   Pulmonary/Chest: Effort normal and breath sounds normal. No respiratory distress. She has no wheezes. She has no rales.  Abdominal: Soft. Bowel sounds are normal. She exhibits no distension and no mass. There is no tenderness. There is no rebound and no guarding.  Neurological: She is alert and oriented to person, place, and time. Coordination normal.  Skin: Skin is warm and dry.   Filed Vitals:   06/15/15 0805  BP: 108/70  Pulse: 85  Temp: 98 F (36.7 C)  TempSrc: Oral  Resp: 16  Height: 5' (1.524 m)  Weight: 188 lb 12.8 oz (85.639 kg)  SpO2: 92%      Assessment & Plan:

## 2015-06-17 NOTE — Assessment & Plan Note (Signed)
No flare today. Follows with cardiology and taking metalozone and spironolactone and torsemide and valsartan and beta blocker.

## 2015-06-17 NOTE — Assessment & Plan Note (Signed)
WBC count increased to 17 on her blood work with most segs and lymphs. Will refer to hematology for evaluation. She has had low level elevation in the last 6 months but she is not ill today. It is unclear if some of her symptoms could be related.

## 2015-06-17 NOTE — Assessment & Plan Note (Signed)
Referral to orthopedics for evaluation of the severity of her scoliosis and if any new options for treatment have come since last evaluation 15-20 years ago. It is unclear to me if some of her dyspnea could be OHS or restriction from the scoliosis. She is not volume overloaded on exam today making CHF as cause less likely. No wheezing to suggest asthma exacerbation.

## 2015-06-17 NOTE — Assessment & Plan Note (Signed)
She is not doing as well recently. Talked to her about the need for counseling since her health has changed so much in the last year. She will also work on more relaxation exercises. I would rather she was not on benzos (started by pulmonary for her breathing) as I have not seen much benefit to her from these and she is on relatively higher dose.

## 2015-06-20 ENCOUNTER — Ambulatory Visit (INDEPENDENT_AMBULATORY_CARE_PROVIDER_SITE_OTHER): Payer: Commercial Managed Care - PPO | Admitting: Pulmonary Disease

## 2015-06-20 ENCOUNTER — Telehealth: Payer: Self-pay | Admitting: Hematology and Oncology

## 2015-06-20 ENCOUNTER — Encounter (HOSPITAL_COMMUNITY)
Admission: RE | Admit: 2015-06-20 | Discharge: 2015-06-20 | Disposition: A | Discharge status: Home / Self Care | Payer: Self-pay | Source: Ambulatory Visit | Attending: Cardiology | Admitting: Cardiology

## 2015-06-20 ENCOUNTER — Encounter: Payer: Self-pay | Admitting: Pulmonary Disease

## 2015-06-20 VITALS — BP 102/70 | HR 90 | Temp 97.7°F | Ht 60.0 in | Wt 187.6 lb

## 2015-06-20 DIAGNOSIS — J984 Other disorders of lung: Secondary | ICD-10-CM

## 2015-06-20 DIAGNOSIS — I428 Other cardiomyopathies: Secondary | ICD-10-CM

## 2015-06-20 DIAGNOSIS — I5042 Chronic combined systolic (congestive) and diastolic (congestive) heart failure: Secondary | ICD-10-CM

## 2015-06-20 DIAGNOSIS — Z9989 Dependence on other enabling machines and devices: Secondary | ICD-10-CM

## 2015-06-20 DIAGNOSIS — G4733 Obstructive sleep apnea (adult) (pediatric): Secondary | ICD-10-CM

## 2015-06-20 DIAGNOSIS — J4530 Mild persistent asthma, uncomplicated: Secondary | ICD-10-CM | POA: Diagnosis not present

## 2015-06-20 DIAGNOSIS — I10 Essential (primary) hypertension: Secondary | ICD-10-CM

## 2015-06-20 DIAGNOSIS — I429 Cardiomyopathy, unspecified: Secondary | ICD-10-CM

## 2015-06-20 DIAGNOSIS — M4120 Other idiopathic scoliosis, site unspecified: Secondary | ICD-10-CM

## 2015-06-20 MED ORDER — LORAZEPAM 1 MG PO TABS: 0.5000 mg | ORAL_TABLET | Freq: Three times a day (TID) | ORAL | Status: DC | PRN

## 2015-06-20 NOTE — Telephone Encounter (Signed)
new patient appt-s/w patient and gave np appt for 02/23 @ 3:45 w/Dr. Pamelia Hoit.  Referring Dr. Okey Dupre Dx- Elevated WBC

## 2015-06-20 NOTE — Patient Instructions (Signed)
Today we updated your med list in our EPIC system...    Continue your current medications the same...  Today we checked your Oxygen saturations while walking in the office>     Please keep a more formal record of your Oxygen saturations at home       --while walking at home, while exerciing at Cardiac rehab, when out & about...        --please call me in 1 month w/ report of your O2 saturations w/ exercise...  We refilled your Lorazepam 1mg  to take 1/2 to 1 tab as needed for the panic attacks and shortness of breath episodes...  Call for any questions...  Let's plan a follow up visit in 33mo, sooner if needed for problems.Marland KitchenMarland Kitchen

## 2015-06-20 NOTE — Telephone Encounter (Signed)
Called pt left vm in ref to np appt. °

## 2015-06-22 ENCOUNTER — Encounter (HOSPITAL_COMMUNITY)
Admission: RE | Admit: 2015-06-22 | Discharge: 2015-06-22 | Disposition: A | Discharge status: Home / Self Care | Payer: Self-pay | Source: Ambulatory Visit | Attending: Cardiology | Admitting: Cardiology

## 2015-06-24 ENCOUNTER — Encounter (HOSPITAL_COMMUNITY)
Admission: RE | Admit: 2015-06-24 | Discharge: 2015-06-24 | Disposition: A | Discharge status: Home / Self Care | Payer: Self-pay | Source: Ambulatory Visit | Attending: Cardiology | Admitting: Cardiology

## 2015-06-27 ENCOUNTER — Encounter (HOSPITAL_COMMUNITY)
Admission: RE | Admit: 2015-06-27 | Discharge: 2015-06-27 | Disposition: A | Discharge status: Home / Self Care | Payer: Self-pay | Source: Ambulatory Visit | Attending: Cardiology | Admitting: Cardiology

## 2015-06-28 ENCOUNTER — Other Ambulatory Visit: Payer: Self-pay | Admitting: Pulmonary Disease

## 2015-06-29 ENCOUNTER — Encounter (HOSPITAL_COMMUNITY)
Admission: RE | Admit: 2015-06-29 | Discharge: 2015-06-29 | Disposition: A | Discharge status: Home / Self Care | Payer: Self-pay | Source: Ambulatory Visit | Attending: Cardiology | Admitting: Cardiology

## 2015-06-30 ENCOUNTER — Encounter: Payer: Self-pay | Admitting: Hematology and Oncology

## 2015-06-30 ENCOUNTER — Ambulatory Visit (HOSPITAL_BASED_OUTPATIENT_CLINIC_OR_DEPARTMENT_OTHER): Payer: Commercial Managed Care - PPO | Admitting: Hematology and Oncology

## 2015-06-30 ENCOUNTER — Telehealth: Payer: Self-pay | Admitting: Hematology and Oncology

## 2015-06-30 DIAGNOSIS — D72829 Elevated white blood cell count, unspecified: Secondary | ICD-10-CM | POA: Diagnosis not present

## 2015-06-30 NOTE — Progress Notes (Signed)
Unable to get in to exam room prior to MD.  No assessment performed.  

## 2015-06-30 NOTE — Progress Notes (Signed)
[facility] NOTE  Patient Care Team: Hoyt Koch, MD as PCP - General (Internal Medicine) Hoyt Koch, MD (Internal Medicine)  CHIEF COMPLAINTS/PURPOSE OF CONSULTATION:  Elevated white blood cell count  HISTORY OF PRESENTING ILLNESS:  Meredith Diaz 35 y.o. female is here because of recent diagnosis of elevated white blood cell count. Patient had a routine blood work on 06/15/2015 which showed a white blood cell count of 17.1 and a platelet count of 474. The differential is not readily available but it there appears to be an outpatient that there are 68% neutrophils and 24% lymphocytes. She has had mildly elevated white blood cell count 5 months ago. She also tells me that in the past she had seen a hematologist in [place] they used to live. At that time they also looked at elevated white blood cell count and consider the diagnosis of myeloproliferative disease but they elected not to pursue a bone marrow biopsy and to watch and monitor. Apparently her white count had gone up and down since then. It had not climbed to any levels which would lead to suspicion for a bone marrow disorder. She is here today accompanied by her husband to discuss a white blood cell count issue and to talk about the treatment plan. Patient has an unusual history of cardiomyopathy at this very young age. She also has problems with right hip bursitis related to scoliosis. She has seen an orthopedic specialist about this. She was prescribed anti-inflammatory treatment. She has not started on that yet.  I reviewed her records extensively and collaborated the history with the patient.  MEDICAL HISTORY:  Past Medical History  Diagnosis Date  . Chronic combined systolic and diastolic CHF (congestive heart failure) ([facility]) 07/2011    a. Prior Echo: EF was 25-30%; b. No Sig CAD on CATH; c. No significant finding on Cardiac MRI; d. follow-up Echo 2014 --  EF of 50%;  e. 03/2014 Echo:  EF 40-45%, gr 1 DD; f. 11/2014 Echo: EF 40-45%, Gr 2 DD.  [name] Asthma   . Thyroid disease   . Depression   . Anxiety   . Stomach problems   . Essential hypertension   . Congenital heart disease, adult     a. 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  . Nonischemic cardiomyopathy ([facility])     a. unclear etiology;  b. 01/2012 Cardiac MRI: no infiltrative pathology, EF 52%;  b. 11/2014 Echo: EF 40-45%, gr 2 DD.  . Morbid obesity ([facility])     SURGICAL HISTORY: Past Surgical History  Procedure Laterality Date  . Cholecystectomy  2013  . Cardiac catheterization  08/2011    Normal Coronaries - LVEDP 32 mmHg  . Transthoracic echocardiogram  07/2011    Moderate concentric LVH, mildly dilated. EF 81-01% no diastolic dysfunction parameters to suggest elevated LAP  . Cardiac mri  April 2013    No suggestion of a congenital abnormality; EF ~ 52% - no regional wall motion abnormalities., no increased myocardial signal intensity. No perfusion abnormality.  . 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 Normal wall thickness (compared to prior reading of possible apical LVH), grade 2 DD -- with elevated LV filling pressures (however left atrium was read as  normal size), normal artery function and size. No no suggestion of pulmonary hypertension    SOCIAL HISTORY: Social History   Social History  . Marital Status: Married    Spouse Name: N/A  . Number of Children: N/A  . Years of Education: N/A   Occupational History  . editor    Social History Main Topics  . Smoking status: Never Smoker   . Smokeless tobacco: Not on file  . Alcohol Use: No  . Drug Use: No  . Sexual Activity: Not on file   Other Topics Concern  . Not on file   Social History  Narrative    FAMILY HISTORY: Family History  Problem Relation Age of Onset  . Depression Mother   . Hypertension Father   . Heart disease Maternal Grandfather   . Heart disease Paternal Grandmother   . Breast cancer Paternal Grandmother   . Colon cancer Maternal Grandmother   . Asthma Paternal Grandmother   . Heart attack Maternal Grandfather   . Heart attack Paternal Grandmother   . Heart attack Paternal Grandfather   . Stroke Neg Hx     ALLERGIES:  is allergic to lisinopril.  MEDICATIONS:  Current Outpatient Prescriptions  Medication Sig Dispense Refill  . calcium carbonate (TUMS - DOSED IN MG ELEMENTAL CALCIUM) 500 MG chewable tablet Chew 1 tablet by mouth daily as needed for indigestion or heartburn. Reported on 06/15/2015    . cholestyramine light (PREVALITE) 4 GM/DOSE powder Take 4 g by mouth as needed (DIARRHEA DUE TO BILE ACIDS).     . clobetasol cream (TEMOVATE) 4.66 % Apply 1 application topically as needed (for Sclerosis).     . clonazePAM (KLONOPIN) 1 MG tablet Take 1 tablet (1 mg total) by mouth 2 (two) times daily. 60 tablet 5  . dicyclomine (BENTYL) 20 MG tablet Take 1 tablet (20 mg total) by mouth as needed for spasms. 60 tablet 3  . DULoxetine (CYMBALTA) 60 MG capsule Take 1 capsule (60 mg total) by mouth daily. 30 capsule 6  . ibuprofen (ADVIL,MOTRIN) 200 MG tablet Take 400 mg by mouth every 6 (six) hours as needed for moderate pain.    [name] levothyroxine (SYNTHROID, LEVOTHROID) 88 MCG tablet Take 1 tablet (88 mcg total) by mouth daily. 90 tablet 3  . LORazepam (ATIVAN) 1 MG tablet Take 0.5-1 tablets (0.5-1 mg total) by mouth 3 (three) times daily as needed for anxiety. 90 tablet 0  . metoprolol succinate (TOPROL-XL) 50 MG 24 hr tablet Take 1 tablet (50 mg total) by mouth daily. Take with or immediately following a meal. 90 tablet 3  . norethindrone-ethinyl estradiol 1/35 (ALAYCEN 1/35) tablet Take 1 tablet by mouth daily. 1 Package 12  . potassium chloride SA  (K-DUR,KLOR-CON) 20 MEQ tablet Take 2 tablets (40 mEq total) by mouth daily. 60 tablet 6  . spironolactone (ALDACTONE) 25 MG tablet Take 1 tablet (25 mg total) by mouth daily. 30 tablet 6  . SYMBICORT 160-4.5 MCG/ACT inhaler INHALE 2 PUFFS INTO THE LUNGS DAILY. (Patient taking differently: INHALE 2 PUFFS INTO THE LUNGS TWICE DAILY) 10.2 Inhaler 9  . torsemide (DEMADEX) 20 MG tablet Take 1 tablet (20 mg total) by mouth daily. 60 tablet 6  . traZODone (DESYREL) 50 MG tablet Take 1/2-1 tablet by mouth in evening as needed for sleep (Patient not taking: Reported on 06/20/2015) 30 tablet 5  . valsartan (DIOVAN) 40 MG tablet Take 1 tablet (40 mg total) by mouth 2 (two) times daily. 60 tablet 6  .  VENTOLIN HFA 108 (90 Base) MCG/ACT inhaler INHALE 2 PUFFS INTO THE LUNGS AS NEEDED FOR WHEEZING OR SHORTNESS OF BREATH. 18 Inhaler 2   No current facility-administered medications for this visit.    REVIEW OF SYSTEMS:   Constitutional: Denies fevers, chills or abnormal night sweats Eyes: Denies blurriness of vision, double vision or watery eyes Ears, nose, mouth, throat, and face: Denies mucositis or sore throat Respiratory: Denies cough, dyspnea or wheezes Cardiovascular: Denies palpitation, chest discomfort or lower extremity swelling Gastrointestinal:  Denies nausea, heartburn or change in bowel habits Skin: Denies abnormal skin rashes Lymphatics: Denies new lymphadenopathy or easy bruising Neurological:Denies numbness, tingling or new weaknesses Behavioral/Psych: Mood is stable, no new changes  Breast:  Denies any palpable lumps or discharge Musculoskeletal: Complains of bursitis in the hip and scoliosis All other systems were reviewed with the patient and are negative.  PHYSICAL EXAMINATION: ECOG PERFORMANCE STATUS: 1 - Symptomatic but completely ambulatory  Filed Vitals:   06/30/15 1530  BP: 112/71  Pulse: 87  Temp: 98 F (36.7 C)  Resp: 19   Filed Weights   06/30/15 1530  Weight: 186  lb 4.8 oz (84.505 kg)    GENERAL:alert, no distress and comfortable SKIN: skin color, texture, turgor are normal, no rashes or significant lesions EYES: normal, conjunctiva are pink and non-injected, sclera clear OROPHARYNX:no exudate, no erythema and lips, buccal mucosa, and tongue normal  NECK: supple, thyroid normal size, non-tender, without nodularity LYMPH:  no palpable lymphadenopathy in the cervical, axillary or inguinal LUNGS: clear to auscultation and percussion with normal breathing effort HEART: regular rate & rhythm and no murmurs and no lower extremity edema ABDOMEN:abdomen soft, non-tender and normal bowel sounds Musculoskeletal:no cyanosis of digits and no clubbing  PSYCH: alert & oriented x 3 with fluent speech NEURO: no focal motor/sensory deficits  LABORATORY DATA:  I have reviewed the data as listed Lab Results  Component Value Date   WBC 17.1* 06/15/2015   HGB 12.9 06/15/2015   HCT 38.6 06/15/2015   MCV 89.2 06/15/2015   PLT 474.0* 06/15/2015   Lab Results  Component Value Date   NA 138 06/10/2015   K 4.7 06/10/2015   CL 104 06/10/2015   CO2 24 06/10/2015   ASSESSMENT AND PLAN:  Elevated WBC count Leucocytosis (06/15/15) 17.1: since 5 months N 68%, L 24%, Mono 7%, Eos: 1%  Differential diagnosis 1. Inflammation: Patient does have bursitis (check CRP) 2. Infections: No clinical signs of infection. 3. Medications: patient is not on any medication that can lead to this. 3. Will also need to rule out myeloproliferative disorders (flow cytometry, BCR-ABL and JAK 2 mutation). If none of these tests do not show clear-cut evidence of the cause of leukocytosis and it is persistent, I will consider performing a bone marrow biopsy.  Thrombocytosis: I suspect it is also an acute phase reactant and could be related to underlying inflammation. She has not had long-standing thrombocytosis. Because this reason, I do not suspect myeloproliferative disease. I suspect this  may be a reactive change.  F/U in 2 week to review the test results and to recheck her CBC.  I strongly suspect that this may be related to inflammation  All questions were answered. The patient knows to call the clinic with any problems, questions or concerns.    Rulon Eisenmenger, MD 06/30/2015

## 2015-06-30 NOTE — Progress Notes (Signed)
Subjective:     Patient ID: Meredith Diaz, female   DOB: 1981-01-24, 35 y.o.   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, Programmer, multimedia for [employer], pt of DrKollar & referred for pulmonary evaluation w/ hx asthma- she notes increased DOE & no relief from her inhalers> [name] is a never smoker & was diagnosed w/ asthma first in 18 at age 49 or so when [facility] for pneumonia (Cariology said she was hosp for CHF w/ EF=25-30% and was in hosp for 74mo) & she's had trouble ever since then, disch on NEBS;  Treated over the yrs w/ MDIs, Symbicort/Advair (the latter didn't help), last had Pred before 2013 hosp;  She was allergy tested in middle school & essentially neg x mild grass reaction by her memory;  She gets "sinus infections" a couple of times each yr (she thinks this is her main trigger) & uses Claritin/ Flonase prn;  Symptoms included SOB/ DOE w/ min activ, feels like she can't get a deep breath/ can't get enough air "in"/ not satisfied breathing (states this sensation is present all the time/constant, & she notes "gasping"); states she notes some wheezing when supine at night & some voice issues (?VCD, tight burning in throat) but denies reflux/ dysphagia/ nocturnal cough or choking (note- she has IBS & s/pGB 2013)... She also c/o sleep issues> can't sleep well while lying down, husb c/o her "wheezing"/ some snoring (but does not indicate that she stops breathing)/ restless sleeper/ leg jerks; she does have some daytime sleepiness issues w/ incr fatigue at work and she'll take naps on weekends; ESS=12... Current Meds> Symbicort160- 2puffs daily, AlbutHFA for prn use...       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 Cumulative data from cardiac evals as follows, she was told  her cardiac issues are ?congenital, ?virus damaged her heart>> she has never had cardiac rehab etc...   CXR 2013 at [facility] showed cardiomegaly, biapical pleural thickening, otherw clear & NAD...  Cardiac MRI from [facility] in 2013 showed EF=52% w/ mild global HK, min AI, RV- wnl, Ao- wnl, mild pectus excavqatum w/ deviation of cardiac apex...  Cath was done in 2013 but I cannot find that report (said no signif CAD)...  EKG 11/15 showed STachy, rate 103, ?LAE, suggestive LVH, NSSTTWA...   2DEcho 11/15 by DrHarding showed mild LVH, ?poss apical variant hypertrophic cardiomyopathy, EF=40-45% w/ diffuse HK, Gr1DD, MV- mildly thickened leaflets and trivMR, norm LA size...  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 Notes hx anorexia/ depression when in high school w/ lowest wt=80 lbs; psyche rx & counseling resulted in wt gain & she was ~150 lbs in college; gained to 180# after college & lost wt to 165 prior to her marriage at age 51; now she is 35 y/o & has gained to max 203#... She is allergic to ACE inhibitors w/ prev angioedema...  EXAM reveals Afeb, VSS, O2sat=93% on RA at rest;  HEENT- neg, mallampati 2, voice sl hoarse;  Chest- decr BS bilat, clear,  decr diaph excursions by percussion, freq sign breaths;  Heart- RRR w/p m/r/g;  Ext- no c/c/e...  Recent LABS> Chems- wnl;  CBC- ok w/ Hg=12.4, WBC=14K;  TSH=2.62;  B12 level= 180-250 (on oral B12- 1081mcg/d)...  CXR 08/16/14 showed norm heart size, clear lungs, NAD...  Spirometry 08/16/14> poor effort (breathing "high" in her lung volumes)> FVC=0.84 (27%), FEV1=0.66 (24%), %1sec=78, mid-flows reduced at 18% predicted; Test indicated severe restriction w/ small airways dis...   Ambulatory oxygen saturations>  O2sat=95% on RA at rest w/ pulse=89/min;  She walked 2 laps and stopped due to SOB- O2sat=83% w/ pulse 121/min...  FENO= 10ppb (results <25ppd implies absence of eosinophilic airway inflammation).  Epworth Sleepiness Score= 12 (indicating that she may be excessively sleepy depending on the situation... IMP/PLAN>>  Airiana has been diagnosed w/ asthma over the last 20+yrs but her current symptoms and spirometry looks more restricted- prob due to "chest wall factors" as we discussed;  She has additional issues from obesity- r/o OSA, and from her Cardiac disease- hx congenital HD w/ surg, HBP, cardiomyopathy w/ combined sys/diast CHF- followed & treated by [facility], DrHarding... We discussed the nature of her dyspnea and the need for additional testing and a trial of KLONOPIN 0.5mg  Bid... We will sched FullPFTs and a Sleep Study and have her return after that...   ~  August 26, 2014:  2wk ROV & Tomeeka returns having completed her Full PFTs and Sleep study> she has in addition been on the Klonopin 0.5mg  Bid but notes no improvement in her breathing on this dose (tol well just no better)...  Full PFTs 08/25/14 showed FVC=0.87 (26%), FEV1=0.77(27%), %1sec=88, mid-flows reduced at 28% predicted; after bronchodil- the FEV1 improved 6%; Lung volumes reduced w/ TLC=1.88 (42%), RV=0.85 (66%), RV/TLC=45 (some air trapping); DLCO=25% predicted, but corrects to normal (102%) when alveolar ventilation is taken into account...  Sleep Polysomnogram 08/18/14> Mod OSA w/ AHI=20/hr and REM AHI=54, mod snoring, desaturation to 64%...  IMP/PLANS>>  PFTs severely restricted (?effort/ accuracy) & seems to be mostly related to obesity/ chest wall and not to underlying lung parenchymal factors (CXR clear, exam clear, we will consider hi-res CTChest later if needed); In this regard we will increase the KLONOPIN to 1mg Bid before giving up on this med; she understands how important wt reduction is to her overall improvement- may want to  consider eating disorder counseling again... Her Sleep study reveals mod OSA & nocturnal hypoxemia- I discussed w/ DrClance & we decided on CPAP set up> RESMED S10 machine- air/auto w/ heated humidity & climate controlled tubing, set on AUTO 5-15, enroll in AirView, mask interface of choice per DME company & pt preference... We will plan ROV recheck in 6 weeks to review compliance & efficacy, then consider doing an ONO...    ~  October 14, 2014:  6wk ROV & Nazia has lost 2# in the interval but not able to exercise- walks some, gets winded, stairs are difficult but able to negotiate one flight; we reviewed how critically important wt reduction is for her;  She still appears sl hoarse to me, scratchy voice which she believes is due to "allergy issues" => rec ENT evaluation for completeness;  She increased the Klonopin to 1mg Bid & this is helping some she says- not as SOB at rest, able to get a deeper breath she thinks, etc;  Using CPAP satis w/ good compliance & download data showing AHI<1/hr on CPAP; she has nasal pillows but wants full face mask & we will let [employer]  know... We reviewed the following medical problems>>     DYSPNEA- multifactorial> ?asthma is a small component w/ restrictive dis due to obesity & scoliosis as a major factor, and CHF, deconditioning, anxiety as additional factors...    OSA>  Mod OSA on sleep study 4/16 w/ AHI=20/hr and REM AHI=54, mod snoring, desaturation to 64%; started on CPAP via [employer], auto 5-15 w/ good compliance => AHI <1/hr on the CPAP autoset 5-15, she wants to change nasal pillows to full face mask, we will check w/ [employer], then do ONO...    Hx asthma> on Symbicort160- 2sp daily, VentolinHFA rescue prn- using 2-3x/wk; PFT 4/16 w/ severe restriction, + small airways dis & min response to bronchodil; asked to continue inhalers for now...    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    Hx HBP> on MetopER25, Apres25- 1.5 tabsTid, Lasix40- 1.5tabsQam, Zaroxyln2.5 prn swelling- taking it almost every day; K=4.2 5/16 at [facility]; Note> allergic to ACE w/ angioedema of lips in past; BP= 130/88 today w/o CP etc...    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    Eating disorder/ Morbid Obesity> unusual hx of eating disorder w/ ANOREXIA as teen=> OBESITY w/ wt=201#, 5' Tall, BMI=39-40; she notes hx anorexia/ depression when in high school w/ lowest wt=80 lbs; psyche rx & counseling resulted in wt gain & she was ~150 lbs in college; gained to 180# after college & lost wt to 165 prior to her marriage at age 19 (she felt better when wt was down); now she is 35 y/o & has gained to max 203#...    Hypothyroidism> on Synthroid50; Labs 12/15 showed TSH=2.62, FreeT4=0.87    Hx IBS & s/p GB in 2013> aware...    Scoliosis & back pain> she was prev evaluated at [facility] by a back specialist, she does not recall the details, no records available, she notes legs get tired, she is not falling, notes that her hands shake on occas... 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    VitB12 defic> Labs showed B12 blood level 179-248 and she is rec to take B12 orally Qd w/ recheck levels...     Anxiety/ depression> on Klonopin  Bid at present... EXAM reveals Afeb, VSS,  O2sat=94% on RA at rest; HEENT- neg, mallampati 2, voice sl hoarse;  Chest- decr BS bilat, clear, decr diaph excursions by percussion, occas sign breaths;  Heart- RRR w/p m/r/g;  Ext- no c/c/e.  LABS 5/16 at [facility] showed Chems- wnl w/ Cr=1.04, BS=83, A1c=5.8;  FLP- at goals on diet alone...  [employer] AirView download> 5/11 - 10/13/14 showed good compliance w/ AHI <1/hr on the CPAP autoset 5-15... PLAN>>  She has a very complex medical situation as outlined above, and we are hampered by lack of old records (I have asked her to  help in getting this data);  For now she will continue the Symbicort, Ventolin, Klonopin, & CPAP; we will have [employer] look into full face mask per her preference; then proceed w/ ONO;  She knows the importance of wt reduction & may want to consider counseling from Dr. Stephania Fragmin for eating disorder;  Continue cardiac meds and f/u w/ DrHarding;  She is only 35 y/o w/ signif pulm restriction & heart disease;  We will XRay her spine in anticipation of getting old records from [place] & poss referral if this is playing a signif roll as well...   ~  December 14, 2014:  19mo ROV & Chelesa returns for follow up> stable on her current regimen; she has multifactorial dyspnea>     OSA>  Mod OSA on sleep study 4/16 w/ AHI=20/hr and REM AHI=54, mod snoring, desaturation to 64%; started on CPAP via [employer], auto 5-15 w/ good compliance => AHI <1/hr on the CPAP autoset 5-15, she changed nasal pillows to full face mask, we will check download on FM, then do ONO...    Hx asthma> on Symbicort160- 2sp daily, VentolinHFA rescue prn- using 2-3x/wk; PFT 4/16 w/ severe restriction, + small airways dis & min response to bronchodil; asked to continue inhalers for now...    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    Hx HBP> on MetopER25, Apres25- 1.5 tabsTid, Lasix40- 1.5tabsQam, Zaroxyln2.5 prn  swelling- taking it almost every day; K=4.2 5/16 at [facility]; Note> allergic to ACE w/ angioedema of lips in past; BP= 130/88 today w/o CP etc...    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    Eating disorder/ Morbid Obesity> unusual hx of eating disorder w/ ANOREXIA as teen=> OBESITY w/ wt=204#, 5' Tall, BMI=39-40; she notes hx anorexia/ depression when in high school w/ lowest wt=80 lbs; psyche rx & counseling resulted in wt gain & she was ~150 lbs in college; gained to 180# after college & lost wt to 165 prior to her marriage at age 58 (she felt better when wt was down); now she is 35 y/o & has gained to max 204#... EXAM reveals Afeb, VSS, O2sat=95% on RA at rest; HEENT- neg, mallampati 2, voice sl hoarse;  Chest- decr BS bilat, clear, decr diaph excursions by percussion, occas sign breaths;  Heart- RRR w/p m/r/g;  Ext- no c/c/e. PLAN>>  Areta will soon start cardiac rehab w/ it's exercise & nutritional counseling- it is imperitive that she get the weight down!!!  We will check CPAP download on full face mask & then ONO on the CPAP set up to be sure that she is not desaturating... ROV planned 3 months...   ADDENDUM>>  CPAP download 8/10 - 01/13/15 on autoset 5-15 and now w/ FULL FaceMask showed 90% compliance, ave 5-6H per night, AHI=0.6.Marland KitchenMarland Kitchen Continue same. ADDENDUM>>  ONO done 01/13/15 on CPAP on RA showed O2sat=88% for 25.38min of the 7H study; lowest O2sat=63%; oxygen desat index=51 desat events per hr; she qualifies for  O2 bleed-in at 2L/min for her CPAP unit...   ~   March 17, 2015:  67mo ROV & Katrice is in Cardiac Rehab per DrHarding, sl improved & planning to continue in maintenance program;  She notes that her breathing is "ok" except w/ weather changes, she's been using the O2 bled into her CPAP Qhs; states she awakes at 3-4AM & can't get back to sleep- she remains on Klonopin 1mg  Bid, she tried TylenoloPM w/o help, tried her Lorazepam w/o help, we discussed trial of DESYREL25-50Qhs...     She was [facility] 8/31 - 01/06/15 by [facility] w/ CP> known nonischemic cardiomyop w/ EF=40-45%, stress test done by [facility] 12/06/14 was neg for ischemia but showed poor exercise tol & hypertensive response; she had CTAngio in ER- neg for PE, CP resolved w/ MS; serial EKG & enz were neg- they added Aldactone and disch for [facility] f/u...  She saw DrHarding 01/27/15 => meds adjusted & his note is reviewed, they plan rov in Dec...     She continues to f/u w/ DrKollar who started Cymbalta for depression- slight improved... EXAM reveals Afeb, VSS, O2sat=97% on RA at rest; HEENT- neg, mallampati 2, voice sl hoarse;  Chest- decr BS bilat, decr chest wall movement and decr diaph excursions by percussion, occas sign breaths, clear;  Heart- RRR w/p m/r/g;  Ext- no c/c/e.  CXR 01/05/15 showed ?left pleural thickening, r/o effusion, no focal airsp dis, scoliosis...  CT Angio Chest 01/05/15 showed mild cardiomeg, no evid of PE, ectasia of Ao measuring 3.7cm, no adenopathy, no consolidation or effusion, min Atx at left base, fusion of several vertebrae, fusion of several post ribs bilat...  LABS 12/2014> K was low at 3.0=> supplemented to norm;  BS in the 120-130 range;  Cr in the 1.1-1.2 range;  Troponins=neg;  BNP=19;  CBC- wnl... IMP/PLAN>>  She has severe pulmonary restriction- obesity, scoliosis, ankylosis;  Weight is down to 187# on diet & [facility] exercises;  rec to continue current meds and we will try Desyrel 25-50 Qhs for sleep...   ~  June 20, 2015:  67mo ROV & last visit we continued same  treatment regimen (encouraged diet, exercise, wt reduction) & added Desyrel25=>50Qhs to help her rest thru the night; Tien reports that this was NOT working, still wakes at [employer] feels exhausted all the time, plus it caused her to ?rage & ?scream she says;  She continues in [facility] rehab maintenance program & she endorses better stamina when walking but still feels SOB "can't get enough air" despite the Klonopin 1mg  Bid; while being monitored during exercise she says her O2sats drop & I've asked her to check this formally & keep record to see if her ambulatory O2 needs modification (today in office her lowest O2sat=90% w/ exercise);  She tells me that DrCrawford (seen 2/17 c/o hair falling out, aching all over, exhausted, panic attacks) has referred her to [facility] for scoliosis & HEME due to CBC w/ WBC=13-17K w/ prev norm diff; I suggested to the pt that her PCP may consider referral to Rheum for poss FM/CFS eval...     Hx asthma> on Symbicort160- 2sp daily, VentolinHFA rescue prn- using 1-2x/wk; PFT 4/16 w/ severe restriction, + small airways dis & min response to bronchodil; asked to continue inhalers for now...    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   - Cardiac MRI (2013) with EF 52%, no delayed  enhancement.   - LHC in 2013 without significant CAD.  - 11/15 echo with EF 40-45%.   - 7/16 echo with EF 40-45%, grade II diastolic dysfunction (Last 2DEcho 7/16 showed EF=40-45% w/ diffuse HK, Gr2DD, norm valves, norm LA size; PA & RV are wnl)  - ETT (8/16) with poor exercise tolerance but no ischemia.   - CPX (12/16): peak VO2 15.7 (23.5 adjusted for ideal body weight), VE/VCO2 slope 21, RER 1.15 => moderate functional limitation thought to be primarily due to restrictive lung physiology.     Eating disorder/ Morbid Obesity> unusual hx of eating disorder w/ ANOREXIA as teen=> OBESITY w/ wt=204#, 5' Tall, BMI=39-40; she notes hx anorexia/ depression when in high school w/ lowest wt=80 lbs; psyche rx & counseling resulted in wt gain & she was ~150 lbs in college; gained to 180# after college & lost wt to 165 prior to her marriage at age 38 (she felt better when wt was down); now she is 35 y/o & has gained to max 204#...    Hypothyroidism> on Synthroid88; Labs 2/17 showed TSH=2.19...    Hx IBS & s/p GB in 2013> aware...    Scoliosis & back pain> she was prev evaluated at [facility] by a back specialist, she does not recall the details, no records available, she notes legs get tired, she is not falling, notes that her hands shake on occas... This is contrib to her pulmonary restriction and dyspnea => need back films then consider referral the [facility] spine center for their review & recommendations (note> her scoliosis could be part of ?Goldenhar syndrome)...    VitB12 defic> Labs showed B12 blood level 179-248 and she is rec to take B12 orally Qd w/ recheck levels; Labs 2/17 showed B12=313    Anxiety/ depression> on Klonopin 1mg  Bid at present & uses Ativan1mg  prn... EXAM reveals Afeb, VSS, O2sat=98% on RA at rest; 5' Tall, wt=188#, BMI=36-37;  HEENT- neg, mallampati 2, voice sl hoarse;  Chest- decr BS bilat, decr chest wall movement and decr diaph excursions by percussion, occas sign breaths, clear;   Heart- RRR w/p m/r/g;  Ext- no c/c/e.  Ambulatory Oximetry 06/20/15> O2sat=96% on RA at rest w/ pulse=89/min; she ambulated 3 laps on RA w/ lowest O2sat=90% w/ pulse=121/min...  CPAP download from 05/21/15=>06/19/15 showed 28/30d >4H, averaging 5-6H per night, Autoset5-15 w/ pressures 6-10cm & AHI=0;  rec to continue same... IMP/PLAN>>  She will continue same meds, continue her diet & exercise at [facility] & keep record of O2sats there; we refilled her Ativan to use prn for her panic attacks which are undoubtedly playing a roll in her dyspnea events; we will recheck pt in 65mo...    Past Medical History  Diagnosis Date    DYSPNEA> restrictive lung dis, morbid obesity... OSA> Sleep study 08/2014 w/ AHI=20, worse in REM, desat to 64%.Marland KitchenMarland Kitchen  Hx anorexia => morbid obesity...    . Chronic diastolic congestive heart failure, NYHA class 2 07/2011    Echo: EF was 25-30%; No Sig CAD on CATH; No significant finding on Cardiac MRI; follow-up Ech0 2014 --  EF of 50%  . Asthma   . Thyroid disease  >>  On Synthroid50, TSH 12/15 = 2.62   . Depression   . Anxiety   . Essential hypertension   . Stomach problems   . Hypertension   . 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 Cardiomyopathy     Unclear etiology. Last EF by echo 40-45% with global hypokinesis, mild LVH possible apical variant hypertrophic cardiomyopathy        B12 Deficiency >> on oral B12 supplement 1062mcg/d;  Labs showed B12 level 178 => 313    Past Surgical History  Procedure Laterality Date  . Cholecystectomy  2013  . Cardiac catheterization  08/2011    Normal Coronaries - LVEDP 32 mmHg  . Transthoracic echocardiogram  07/2011    Moderate concentric LVH, mildly dilated. EF 25-30% no diastolic dysfunction parameters to suggest elevated LAP  . Cardiac mri  April 2013    No  suggestion of a congenital abnormality; EF ~ 52% - no regional wall motion abnormalities., no increased myocardial signal intensity. No perfusion abnormality.  . 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 Normal wall thickness (compared to prior reading of possible apical LVH), grade 2 DD -- with elevated LV filling pressures (however left atrium was read as normal size), normal artery function and size. No no suggestion of pulmonary hypertension    Outpatient Encounter Prescriptions as of 03/17/2015  Medication Sig  . albuterol (PROVENTIL HFA;VENTOLIN HFA) 108 (90 BASE) MCG/ACT inhaler Inhale 2 puffs into the lungs as needed for wheezing or shortness of breath.  . calcium carbonate (TUMS - DOSED IN MG ELEMENTAL CALCIUM) 500 MG chewable tablet Chew 1 tablet by mouth daily as needed for indigestion or heartburn.  . cholestyramine light (PREVALITE) 4 GM/DOSE powder Take 4 g by mouth as needed (DIARRHEA DUE TO BILE ACIDS).   . clobetasol cream (TEMOVATE) 0.05 % Apply 1 application topically as needed (for Sclerosis).   . clonazePAM (KLONOPIN) 1 MG tablet Take 1 tablet (1 mg total) by mouth 2 (two) times daily.  . cyclobenzaprine (FLEXERIL) 5 MG tablet Take 1 tablet (5 mg total) by mouth 3 (three) times daily as needed for muscle spasms.  [name] dicyclomine (BENTYL) 20 MG tablet Take 1 tablet (20 mg total) by mouth as needed for spasms. (Patient taking differently: Take 20 mg by mouth daily as needed for spasms. )  . DULoxetine (CYMBALTA) 60 MG capsule Take 1 capsule (60 mg total) by mouth daily.  [name] esomeprazole (NEXIUM) 40 MG packet Take 40 mg by mouth daily before breakfast.  . hydrALAZINE (APRESOLINE) 50 MG tablet Take 50 mg twice a day (Patient taking differently: Take 50 mg by mouth 2 (two) times daily. )  . ibuprofen (ADVIL,MOTRIN) 200 MG tablet Take 400 mg by  mouth every 6 (six) hours as needed for moderate pain.  [name] levothyroxine (SYNTHROID, LEVOTHROID) 75 MCG tablet Take 1 tablet (75 mcg total) by mouth daily.  [name]  LORazepam (ATIVAN) 1 MG tablet Take 1 tablet (1 mg total) by mouth 3 (three) times daily as needed for anxiety (or dizziness).  . meclizine (ANTIVERT) 25 MG tablet Take 1 tablet (25 mg total) by mouth 3 (three) times daily as needed for dizziness.  . metolazone (ZAROXOLYN) 2.5 MG tablet Take 2.5 mg by mouth as needed (swelling).   . metoprolol (LOPRESSOR) 50 MG tablet Take 50 mg by mouth daily.   . norethindrone-ethinyl estradiol 1/35 (ALAYCEN 1/35) tablet Take 1 tablet by mouth daily.  . ondansetron (ZOFRAN) 4 MG tablet Take 1 tablet (4 mg total) by mouth every 6 (six) hours as needed for nausea or vomiting.  . potassium chloride SA (K-DUR,KLOR-CON) 20 MEQ tablet Take 20 mEq by mouth daily.  [name] spironolactone (ALDACTONE) 25 MG tablet Take 0.5 tablets (12.5 mg total) by mouth daily.  . SYMBICORT 160-4.5 MCG/ACT inhaler INHALE 2 PUFFS INTO THE LUNGS DAILY. (Patient taking differently: INHALE 2 PUFFS INTO THE LUNGS TWICE DAILY)  . torsemide (DEMADEX) 20 MG tablet Take 1 tablet (20 mg total) by mouth daily.  . vitamin B-12 (CYANOCOBALAMIN) 1000 MCG tablet Take 1 tablet (1,000 mcg total) by mouth daily.    Allergies  Allergen Reactions  . Lisinopril Swelling    Angioedema facial, mostly just lips, no trouble breathing    Current Medications, Allergies, Past Medical History, Past Surgical History, Family History, and Social History were reviewed in [employer] record.   Review of Systems             All symptoms NEG except where BOLDED >>  Constitutional:  Denies F/C/S, anorexia, unexpected weight change. HEENT:  No HA, visual changes, earache, nasal symptoms, sore throat, hoarseness. Resp:  cough, sputum, hemoptysis; SOB, tightness, wheezing. Cardio:  CP, palpit, DOE, orthopnea, edema. GI:  Denies N/V/D/C or  blood in stool; no reflux, abd pain, distention, or gas. GU:  No dysuria, freq, urgency, hematuria, or flank pain. MS:  Denies joint pain, swelling, tenderness, or decr ROM; no neck pain, back pain, etc. Neuro:  No tremors, seizures, dizziness, syncope, weakness, numbness, gait abn. Skin:  No suspicious lesions or skin rash. Heme:  No adenopathy, bruising, bleeding. Psyche: Denies confusion, sleep disturbance, hallucinations, anxiety, depression.   Objective:   Physical Exam    Vital Signs:  Reviewed...  General:  WD, overweight, 35 y/o WF in NAD; alert & oriented; pleasant & cooperative... HEENT:  [place]/AT; Conjunctiva- pink, Sclera- nonicteric, EOM-wnl, PERRLA, EACs-clear, TMs-wnl; NOSE-clear; THROAT-clear & wnl. Neck:  Supple w/ fair ROM; no JVD; normal carotid impulses w/o bruits; no thyromegaly or nodules palpated; no lymphadenopathy. Chest:  Clear to P & A; without wheezes, rales, or rhonchi heard (decr BS at bases) Heart:  Regular Rhythm; gr1/6 SEM, without rubs or gallops detected. Abdomen:  Obese, soft, nontender- no guarding or rebound; normal bowel sounds; no organomegaly or masses palpated. Ext:  Normal ROM; without deformities or arthritic changes; no varicose veins, +venous insuffic, tr edema;  Pulses intact w/o bruits. Neuro:  No focal neuro deficits, gait normal & balance OK. Derm:  No lesions noted; no rash etc. Lymph:  No cervical, supraclavicular, axillary, or inguinal adenopathy palpated.   Assessment:      DYSPNEA>> multifactorial Restrictive lung dis- felt to be due to obesity & chest wall factors and not parenchymal pulm dis => continue Klonopin 1mg  Bid... OSA w/ mod AHI=20/hr (worse w/ REM & desat to 64%) on sleep study 08/2014 => now on CPAP Auto 5-15 w/  good compliance and AHI<1, ONO showed signif hypoxemia on the CPAP therefore Oxygen added Qhs... Hx eating disorder w/ anorexia => morbid obesity and she may benefit from psyche counseling again... Hx congenital  HD w/ surg 1981, HBP, nonischemic cardiomyopathy w/ combined sys/diast CHF- followed & treated by [facility], DrHarding...   PLAN>>  She has a very complex medical situation as outlined above, and we are hampered by lack of old records (I have asked her to help in getting this data);  For now she will continue the Symbicort, Ventolin, Klonopin, & CPAP; She knows the importance of wt reduction & may want to consider counseling from Dr. Stephania Fragmin for eating disorder;  Continue cardiac meds and f/u w/ DrHarding;  She is only 35 y/o w/ signif pulm restriction & heart disease... 8/9> Dollene will soon start cardiac rehab w/ it's exercise & nutritional counseling- it is imperitive that she get the weight down!!!  We will check CPAP download on full face mask & then ONO on the CPAP set up to be sure that she is not desaturating... ROV planned 3 months 11/10> She has severe pulmonary restriction- obesity, scoliosis, ankylosis;  Weight is down to 187# on diet & [facility] exercises;  rec to continue current meds and we will try Desyrel 25=50 Qhs for sleep. 2/13>  She will continue same meds, continue her diet & exercise at [facility] & keep record of O2sats there; we refilled her Ativan to use prn for her panic attacks which are undoubtedly playing a roll in her dyspnea.     Plan:     Patient's Medications  New Prescriptions   No medications on file  Previous Medications   CALCIUM CARBONATE (TUMS - DOSED IN MG ELEMENTAL CALCIUM) 500 MG CHEWABLE TABLET    Chew 1 tablet by mouth daily as needed for indigestion or heartburn. Reported on 06/15/2015   CHOLESTYRAMINE LIGHT (PREVALITE) 4 GM/DOSE POWDER    Take 4 g by mouth as needed (DIARRHEA DUE TO BILE ACIDS).    CLOBETASOL CREAM (TEMOVATE) 0.05 %    Apply 1 application topically as needed (for Sclerosis).    CLONAZEPAM (KLONOPIN) 1 MG TABLET    Take 1 tablet (1 mg total) by mouth 2 (two) times daily.   DICYCLOMINE (BENTYL) 20 MG TABLET    Take 1 tablet (20 mg total) by  mouth as needed for spasms.   DULOXETINE (CYMBALTA) 60 MG CAPSULE    Take 1 capsule (60 mg total) by mouth daily.   IBUPROFEN (ADVIL,MOTRIN) 200 MG TABLET    Take 400 mg by mouth every 6 (six) hours as needed for moderate pain.   LEVOTHYROXINE (SYNTHROID, LEVOTHROID) 88 MCG TABLET    Take 1 tablet (88 mcg total) by mouth daily.   METOPROLOL SUCCINATE (TOPROL-XL) 50 MG 24 HR TABLET    Take 1 tablet (50 mg total) by mouth daily. Take with or immediately following a meal.   NORETHINDRONE-ETHINYL ESTRADIOL 1/35 (ALAYCEN 1/35) TABLET    Take 1 tablet by mouth daily.   POTASSIUM CHLORIDE SA (K-DUR,KLOR-CON) 20 MEQ TABLET    Take 2 tablets (40 mEq total) by mouth daily.   SPIRONOLACTONE (ALDACTONE) 25 MG TABLET    Take 1 tablet (25 mg total) by mouth daily.   SYMBICORT 160-4.5 MCG/ACT INHALER    INHALE 2 PUFFS INTO THE LUNGS DAILY.   TORSEMIDE (DEMADEX) 20 MG TABLET    Take 1 tablet (20 mg total) by mouth daily.   TRAZODONE (DESYREL) 50 MG  TABLET    Take 1/2-1 tablet by mouth in evening as needed for sleep   VALSARTAN (DIOVAN) 40 MG TABLET    Take 1 tablet (40 mg total) by mouth 2 (two) times daily.  Modified Medications   Modified Medication Previous Medication   LORAZEPAM (ATIVAN) 1 MG TABLET LORazepam (ATIVAN) 1 MG tablet      Take 0.5-1 tablets (0.5-1 mg total) by mouth 3 (three) times daily as needed for anxiety.    Take 1 tablet (1 mg total) by mouth 3 (three) times daily as needed for anxiety (or dizziness).   VENTOLIN HFA 108 (90 BASE) MCG/ACT INHALER albuterol (PROVENTIL HFA;VENTOLIN HFA) 108 (90 BASE) MCG/ACT inhaler      INHALE 2 PUFFS INTO THE LUNGS AS NEEDED FOR WHEEZING OR SHORTNESS OF BREATH.    Inhale 2 puffs into the lungs as needed for wheezing or shortness of breath.  Discontinued Medications   METOLAZONE (ZAROXOLYN) 2.5 MG TABLET    Take 2.5 mg by mouth as needed (swelling). Reported on 05/23/2015   VITAMIN B-12 (CYANOCOBALAMIN) 1000 MCG TABLET    Take 1 tablet (1,000 mcg total) by  mouth daily.

## 2015-06-30 NOTE — Assessment & Plan Note (Addendum)
Leucocytosis (06/15/15) 17.1: since 5 months N 68%, L 24%, Mono 7%, Eos: 1% Differential diagnosis 1. Inflammation (check CRP) 2. Infections 3. Will also need to rule out myeloproliferative disorders (flow cytometry, BCR-ABL) F/U in 1 week to review the test results

## 2015-06-30 NOTE — Telephone Encounter (Signed)
Gave patient avs report and appointments for March. Per 2/23 pof lab today - lab closed at this time. Patient will return for lab tomorrow @ 2 pm.

## 2015-07-01 ENCOUNTER — Other Ambulatory Visit (HOSPITAL_COMMUNITY)
Admission: RE | Admit: 2015-07-01 | Discharge: 2015-07-01 | Disposition: A | Discharge status: Home / Self Care | Payer: Commercial Managed Care - PPO | Source: Ambulatory Visit | Attending: Hematology and Oncology | Admitting: Hematology and Oncology

## 2015-07-01 ENCOUNTER — Encounter (HOSPITAL_COMMUNITY): Payer: Self-pay

## 2015-07-01 ENCOUNTER — Other Ambulatory Visit: Payer: Self-pay | Admitting: *Deleted

## 2015-07-01 ENCOUNTER — Other Ambulatory Visit (HOSPITAL_BASED_OUTPATIENT_CLINIC_OR_DEPARTMENT_OTHER): Payer: Commercial Managed Care - PPO

## 2015-07-01 DIAGNOSIS — D72829 Elevated white blood cell count, unspecified: Secondary | ICD-10-CM

## 2015-07-01 LAB — CBC WITH DIFFERENTIAL/PLATELET
BASO%: 0.8 % (ref 0.0–2.0)
BASOS ABS: 0.1 10*3/uL (ref 0.0–0.1)
EOS%: 1.4 % (ref 0.0–7.0)
Eosinophils Absolute: 0.2 10*3/uL (ref 0.0–0.5)
HEMATOCRIT: 39.6 % (ref 34.8–46.6)
HGB: 13.1 g/dL (ref 11.6–15.9)
LYMPH#: 3.1 10*3/uL (ref 0.9–3.3)
LYMPH%: 23.8 % (ref 14.0–49.7)
MCH: 29.6 pg (ref 25.1–34.0)
MCHC: 33 g/dL (ref 31.5–36.0)
MCV: 89.8 fL (ref 79.5–101.0)
MONO#: 1.2 10*3/uL — ABNORMAL HIGH (ref 0.1–0.9)
MONO%: 8.9 % (ref 0.0–14.0)
NEUT#: 8.4 10*3/uL — ABNORMAL HIGH (ref 1.5–6.5)
NEUT%: 65.1 % (ref 38.4–76.8)
Platelets: 403 10*3/uL — ABNORMAL HIGH (ref 145–400)
RBC: 4.41 10*6/uL (ref 3.70–5.45)
RDW: 12.9 % (ref 11.2–14.5)
WBC: 13 10*3/uL — ABNORMAL HIGH (ref 3.9–10.3)

## 2015-07-01 LAB — COMPREHENSIVE METABOLIC PANEL
ALT: 35 U/L (ref 0–55)
AST: 24 U/L (ref 5–34)
Albumin: 3.5 g/dL (ref 3.5–5.0)
Alkaline Phosphatase: 109 U/L (ref 40–150)
Anion Gap: 11 mEq/L (ref 3–11)
BUN: 22.2 mg/dL (ref 7.0–26.0)
CALCIUM: 9.1 mg/dL (ref 8.4–10.4)
CHLORIDE: 99 meq/L (ref 98–109)
CO2: 29 meq/L (ref 22–29)
CREATININE: 1.2 mg/dL — AB (ref 0.6–1.1)
EGFR: 61 mL/min/{1.73_m2} — ABNORMAL LOW (ref >90)
GLUCOSE: 101 mg/dL (ref 70–140)
POTASSIUM: 3.4 meq/L — AB (ref 3.5–5.1)
SODIUM: 138 meq/L (ref 136–145)
Total Bilirubin: 0.38 mg/dL (ref 0.20–1.20)
Total Protein: 8.2 g/dL (ref 6.4–8.3)

## 2015-07-01 LAB — LACTATE DEHYDROGENASE: LDH: 187 U/L (ref 125–245)

## 2015-07-02 LAB — C-REACTIVE PROTEIN: CRP: 42.9 mg/L — AB (ref 0.0–4.9)

## 2015-07-04 ENCOUNTER — Encounter (HOSPITAL_COMMUNITY): Payer: Self-pay

## 2015-07-05 LAB — FLOW CYTOMETRY

## 2015-07-06 ENCOUNTER — Encounter (HOSPITAL_COMMUNITY): Payer: Self-pay

## 2015-07-06 DIAGNOSIS — I5043 Acute on chronic combined systolic (congestive) and diastolic (congestive) heart failure: Secondary | ICD-10-CM | POA: Insufficient documentation

## 2015-07-08 ENCOUNTER — Encounter (HOSPITAL_COMMUNITY)
Admission: RE | Admit: 2015-07-08 | Discharge: 2015-07-08 | Disposition: A | Discharge status: Home / Self Care | Payer: Self-pay | Source: Ambulatory Visit | Attending: Cardiology | Admitting: Cardiology

## 2015-07-08 ENCOUNTER — Encounter: Payer: Self-pay | Admitting: *Deleted

## 2015-07-08 NOTE — Progress Notes (Signed)
Received lab results from [facility], reviewed by Dr Pamelia Hoit, sent to scan.

## 2015-07-11 ENCOUNTER — Encounter (HOSPITAL_COMMUNITY)
Admission: RE | Admit: 2015-07-11 | Discharge: 2015-07-11 | Disposition: A | Discharge status: Home / Self Care | Payer: Self-pay | Source: Ambulatory Visit | Attending: Cardiology | Admitting: Cardiology

## 2015-07-11 NOTE — Assessment & Plan Note (Signed)
Leucocytosis (06/15/15) 17.1: since 5 months N 68%, L 24%, Mono 7%, Eos: 1% Flow cytometry: 07/01/15: No monoclonal B cell population [id number] JAK-2 Mutation:Neg CRP: 42.9 Most likely due to Inflammation.

## 2015-07-12 ENCOUNTER — Ambulatory Visit (HOSPITAL_BASED_OUTPATIENT_CLINIC_OR_DEPARTMENT_OTHER): Payer: Commercial Managed Care - PPO | Admitting: Hematology and Oncology

## 2015-07-12 ENCOUNTER — Other Ambulatory Visit (HOSPITAL_BASED_OUTPATIENT_CLINIC_OR_DEPARTMENT_OTHER): Payer: Commercial Managed Care - PPO

## 2015-07-12 ENCOUNTER — Encounter: Payer: Self-pay | Admitting: Hematology and Oncology

## 2015-07-12 VITALS — BP 129/67 | HR 88 | Temp 98.3°F | Resp 18 | Ht 60.0 in | Wt 187.6 lb

## 2015-07-12 DIAGNOSIS — D72829 Elevated white blood cell count, unspecified: Secondary | ICD-10-CM

## 2015-07-12 LAB — CBC WITH DIFFERENTIAL/PLATELET
BASO%: 0.8 % (ref 0.0–2.0)
BASOS ABS: 0.1 10*3/uL (ref 0.0–0.1)
EOS%: 1.9 % (ref 0.0–7.0)
Eosinophils Absolute: 0.3 10*3/uL (ref 0.0–0.5)
HCT: 40.3 % (ref 34.8–46.6)
HGB: 13.3 g/dL (ref 11.6–15.9)
LYMPH%: 26.3 % (ref 14.0–49.7)
MCH: 29.2 pg (ref 25.1–34.0)
MCHC: 32.9 g/dL (ref 31.5–36.0)
MCV: 88.7 fL (ref 79.5–101.0)
MONO#: 1.4 10*3/uL — AB (ref 0.1–0.9)
MONO%: 8.9 % (ref 0.0–14.0)
NEUT#: 9.7 10*3/uL — ABNORMAL HIGH (ref 1.5–6.5)
NEUT%: 62.1 % (ref 38.4–76.8)
PLATELETS: 460 10*3/uL — AB (ref 145–400)
RBC: 4.54 10*6/uL (ref 3.70–5.45)
RDW: 13 % (ref 11.2–14.5)
WBC: 15.6 10*3/uL — ABNORMAL HIGH (ref 3.9–10.3)
lymph#: 4.1 10*3/uL — ABNORMAL HIGH (ref 0.9–3.3)

## 2015-07-12 NOTE — Progress Notes (Signed)
Patient Care Team: Myrlene Broker, MD as PCP - General (Internal Medicine) Myrlene Broker, MD (Internal Medicine)  DIAGNOSIS: Leukocytosis and thrombocytosis.  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 She had extensive blood work and is here accompanied by her husband to discuss the results. She has had lot and issues with bursitis. She denies any fevers or chills.  REVIEW OF SYSTEMS:   Constitutional: Denies fevers, chills or abnormal weight loss Eyes: Denies blurriness of vision Ears, nose, mouth, throat, and face: Denies mucositis or sore throat Respiratory: Denies cough, dyspnea or wheezes Cardiovascular: Denies palpitation, chest discomfort Gastrointestinal:  Denies nausea, heartburn or change in bowel habits Skin: Denies abnormal skin rashes Lymphatics: Denies new lymphadenopathy or easy bruising Neurological:Denies numbness, tingling or new weaknesses Behavioral/Psych: Mood is stable, no new changes  Extremities: No lower extremity edema  All other systems were reviewed with the patient and are negative.  I have reviewed the past medical history, past surgical history, social history and family history with the patient and they are unchanged from previous note.  ALLERGIES:  is allergic to lisinopril.  MEDICATIONS:  Current Outpatient Prescriptions  Medication Sig Dispense Refill  . calcium carbonate (TUMS - DOSED IN MG ELEMENTAL CALCIUM) 500 MG chewable tablet Chew 1 tablet by mouth daily as needed for indigestion or heartburn. Reported on 06/15/2015    . cholestyramine light (PREVALITE) 4 GM/DOSE powder Take 4 g by mouth as needed (DIARRHEA DUE TO BILE ACIDS).     . clobetasol cream (TEMOVATE) 0.05 % Apply 1 application topically as needed (for Sclerosis).     . clonazePAM (KLONOPIN) 1 MG tablet Take 1 tablet (1  mg total) by mouth 2 (two) times daily. 60 tablet 5  . dicyclomine (BENTYL) 20 MG tablet Take 1 tablet (20 mg total) by mouth as needed for spasms. 60 tablet 3  . DULoxetine (CYMBALTA) 60 MG capsule Take 1 capsule (60 mg total) by mouth daily. 30 capsule 6  . ibuprofen (ADVIL,MOTRIN) 200 MG tablet Take 400 mg by mouth every 6 (six) hours as needed for moderate pain.    [name] levothyroxine (SYNTHROID, LEVOTHROID) 88 MCG tablet Take 1 tablet (88 mcg total) by mouth daily. 90 tablet 3  . LORazepam (ATIVAN) 1 MG tablet Take 0.5-1 tablets (0.5-1 mg total) by mouth 3 (three) times daily as needed for anxiety. 90 tablet 0  . metoprolol succinate (TOPROL-XL) 50 MG 24 hr tablet Take 1 tablet (50 mg total) by mouth daily. Take with or immediately following a meal. 90 tablet 3  . norethindrone-ethinyl estradiol 1/35 (ALAYCEN 1/35) tablet Take 1 tablet by mouth daily. 1 Package 12  . potassium chloride SA (K-DUR,KLOR-CON) 20 MEQ tablet Take 2 tablets (40 mEq total) by mouth daily. 60 tablet 6  . spironolactone (ALDACTONE) 25 MG tablet Take 1 tablet (25 mg total) by mouth daily. 30 tablet 6  . SYMBICORT 160-4.5 MCG/ACT inhaler INHALE 2 PUFFS INTO THE LUNGS DAILY. (Patient taking differently: INHALE 2 PUFFS INTO THE LUNGS TWICE DAILY) 10.2 Inhaler 9  . torsemide (DEMADEX) 20 MG tablet Take 1 tablet (20 mg total) by mouth daily. 60 tablet 6  . traZODone (DESYREL) 50 MG tablet Take 1/2-1 tablet by mouth in evening as needed for sleep (Patient not taking: Reported on 06/20/2015) 30 tablet 5  . valsartan (DIOVAN) 40 MG tablet Take 1 tablet (40 mg total) by mouth  2 (two) times daily. 60 tablet 6  . VENTOLIN HFA 108 (90 Base) MCG/ACT inhaler INHALE 2 PUFFS INTO THE LUNGS AS NEEDED FOR WHEEZING OR SHORTNESS OF BREATH. 18 Inhaler 2   No current facility-administered medications for this visit.    PHYSICAL EXAMINATION: ECOG PERFORMANCE STATUS: 1 - Symptomatic but completely ambulatory  Filed Vitals:   07/12/15 1317  BP:  129/67  Pulse: 88  Temp: 98.3 F (36.8 C)  Resp: 18   Filed Weights   07/12/15 1317  Weight: 187 lb 9.6 oz (85.095 kg)    GENERAL:alert, no distress and comfortable SKIN: skin color, texture, turgor are normal, no rashes or significant lesions EYES: normal, Conjunctiva are pink and non-injected, sclera clear OROPHARYNX:no exudate, no erythema and lips, buccal mucosa, and tongue normal  NECK: supple, thyroid normal size, non-tender, without nodularity LYMPH:  no palpable lymphadenopathy in the cervical, axillary or inguinal LUNGS: clear to auscultation and percussion with normal breathing effort HEART: regular rate & rhythm and no murmurs and no lower extremity edema ABDOMEN:abdomen soft, non-tender and normal bowel sounds MUSCULOSKELETAL:no cyanosis of digits and no clubbing  NEURO: alert & oriented x 3 with fluent speech, no focal motor/sensory deficits EXTREMITIES: No lower extremity edema  LABORATORY DATA:  I have reviewed the data as listed   Chemistry      Component Value Date/Time   NA 138 07/01/2015 1420   NA 138 06/10/2015 0921   K 3.4* 07/01/2015 1420   K 4.7 06/10/2015 0921   CL 104 06/10/2015 0921   CO2 29 07/01/2015 1420   CO2 24 06/10/2015 0921   BUN 22.2 07/01/2015 1420   BUN 18 06/10/2015 0921   CREATININE 1.2* 07/01/2015 1420   CREATININE 0.95 06/10/2015 0921   CREATININE 0.78 11/17/2014 1408      Component Value Date/Time   CALCIUM 9.1 07/01/2015 1420   CALCIUM 9.3 06/10/2015 0921   ALKPHOS 109 07/01/2015 1420   ALKPHOS 96 01/13/2015 0951   AST 24 07/01/2015 1420   AST 19 01/13/2015 0951   ALT 35 07/01/2015 1420   ALT 24 01/13/2015 0951   BILITOT 0.38 07/01/2015 1420   BILITOT 0.4 01/13/2015 0951     Lab Results  Component Value Date   WBC 15.6* 07/12/2015   HGB 13.3 07/12/2015   HCT 40.3 07/12/2015   MCV 88.7 07/12/2015   PLT 460* 07/12/2015   NEUTROABS 9.7* 07/12/2015   ASSESSMENT & PLAN:  Elevated WBC count Leucocytosis (06/15/15)  17.1: since 5 months N 68%, L 24%, Mono 7%, Eos: 1% Flow cytometry: 07/01/15: No monoclonal B cell population BCR-ABLNeg JAK-2 Mutation:Neg CRP: 42.9 Most likely due to Inflammation. I discussed with the patient that she may need to see rheumatology. I will leave it up to the discretion of her primary care physician to make that referral.  No orders of the defined types were placed in this encounter.   The patient has a good understanding of the overall plan. she agrees with it. she will call with any problems that may develop before the next visit here.   Sabas Sous, MD 07/12/2015

## 2015-07-13 ENCOUNTER — Encounter (HOSPITAL_COMMUNITY)
Admission: RE | Admit: 2015-07-13 | Discharge: 2015-07-13 | Disposition: A | Discharge status: Home / Self Care | Payer: Self-pay | Source: Ambulatory Visit | Attending: Cardiology | Admitting: Cardiology

## 2015-07-15 ENCOUNTER — Encounter (HOSPITAL_COMMUNITY)
Admission: RE | Admit: 2015-07-15 | Discharge: 2015-07-15 | Disposition: A | Discharge status: Home / Self Care | Payer: Self-pay | Source: Ambulatory Visit | Attending: Cardiology | Admitting: Cardiology

## 2015-07-15 NOTE — Progress Notes (Signed)
Maintenance exercise class at 6:45am. Rennis Harding reported that her HR was 120 when she first arrived and checked on the Pulse oximeter.  Check -in BP was 124/82 which is higher than her usual check-in BPs.  Recheck HR on pulse oximeter while sitting was 100 bpm and recheck BP 124/84.  Had her go to treatment room and placed on Zoll monitor ECG showed NSR rate at 94-95 bpm.  She admitted to feeling off this morning and almost did not come in. She reported that her weight was up 2 pounds contributing it to the dinner she ate last night. She also admitted to having a very stressful week with follow-up appointment regarding some health matters. Results were positive however it has been emotionally draining for her. She is still seeking to understand why she feels the way she does and shared will likely have additional Specialist to see.  Provided emotional support. Encouraged her to follow-up this morning with her Primary physician. She agreed. Also asked if she wanted to touch base with the [facility] this morning and she said no that she already had plans to follow up later today with the clinic.  Hortencia decided not to exercise today.

## 2015-07-18 ENCOUNTER — Encounter (HOSPITAL_COMMUNITY)
Admission: RE | Admit: 2015-07-18 | Discharge: 2015-07-18 | Disposition: A | Discharge status: Home / Self Care | Payer: Self-pay | Source: Ambulatory Visit | Attending: Cardiology | Admitting: Cardiology

## 2015-07-20 ENCOUNTER — Encounter (HOSPITAL_COMMUNITY)
Admission: RE | Admit: 2015-07-20 | Discharge: 2015-07-20 | Disposition: A | Discharge status: Home / Self Care | Payer: Self-pay | Source: Ambulatory Visit | Attending: Cardiology | Admitting: Cardiology

## 2015-07-22 ENCOUNTER — Encounter (HOSPITAL_COMMUNITY)
Admission: RE | Admit: 2015-07-22 | Discharge: 2015-07-22 | Disposition: A | Discharge status: Home / Self Care | Payer: Self-pay | Source: Ambulatory Visit | Attending: Cardiology | Admitting: Cardiology

## 2015-07-25 ENCOUNTER — Telehealth: Payer: Self-pay | Admitting: Internal Medicine

## 2015-07-25 ENCOUNTER — Other Ambulatory Visit: Payer: Self-pay | Admitting: Nurse Practitioner

## 2015-07-25 ENCOUNTER — Encounter (HOSPITAL_COMMUNITY): Payer: Self-pay

## 2015-07-25 NOTE — Telephone Encounter (Signed)
Pt said that hematology told her that she needed to be referral to rheumatology    [facility] -

## 2015-07-26 NOTE — Telephone Encounter (Signed)
REFILL 

## 2015-07-26 NOTE — Telephone Encounter (Signed)
We can talk about it at next visit, this is not an urgent issue.

## 2015-07-26 NOTE — Telephone Encounter (Signed)
Tried to reach patient to inform. The phone has been busy. Per, Dr. Okey Dupre will discuss the referral to rheumatology at next visit.

## 2015-07-27 ENCOUNTER — Encounter (HOSPITAL_COMMUNITY)
Admission: RE | Admit: 2015-07-27 | Discharge: 2015-07-27 | Disposition: A | Discharge status: Home / Self Care | Payer: Self-pay | Source: Ambulatory Visit | Attending: Cardiology | Admitting: Cardiology

## 2015-07-28 ENCOUNTER — Encounter (HOSPITAL_COMMUNITY)
Admission: RE | Admit: 2015-07-28 | Discharge: 2015-07-28 | Disposition: A | Discharge status: Home / Self Care | Payer: Self-pay | Source: Ambulatory Visit | Attending: Cardiology | Admitting: Cardiology

## 2015-07-28 LAB — MPN-ET/MYELOFIBROSIS (JAK2 V617F-MPL515-CALR)

## 2015-07-29 ENCOUNTER — Encounter (HOSPITAL_COMMUNITY)
Admission: RE | Admit: 2015-07-29 | Discharge: 2015-07-29 | Disposition: A | Discharge status: Home / Self Care | Payer: Self-pay | Source: Ambulatory Visit | Attending: Cardiology | Admitting: Cardiology

## 2015-07-29 NOTE — Telephone Encounter (Signed)
Informed pt and she will discuss at next visit

## 2015-08-01 ENCOUNTER — Encounter (HOSPITAL_COMMUNITY): Payer: Self-pay

## 2015-08-03 ENCOUNTER — Encounter (HOSPITAL_COMMUNITY)
Admission: RE | Admit: 2015-08-03 | Discharge: 2015-08-03 | Disposition: A | Discharge status: Home / Self Care | Payer: Self-pay | Source: Ambulatory Visit | Attending: Cardiology | Admitting: Cardiology

## 2015-08-04 ENCOUNTER — Encounter (HOSPITAL_COMMUNITY)
Admission: RE | Admit: 2015-08-04 | Discharge: 2015-08-04 | Disposition: A | Discharge status: Home / Self Care | Payer: Self-pay | Source: Ambulatory Visit | Attending: Cardiology | Admitting: Cardiology

## 2015-08-05 ENCOUNTER — Encounter (HOSPITAL_COMMUNITY)
Admission: RE | Admit: 2015-08-05 | Discharge: 2015-08-05 | Disposition: A | Discharge status: Home / Self Care | Payer: Self-pay | Source: Ambulatory Visit | Attending: Cardiology | Admitting: Cardiology

## 2015-08-05 NOTE — Progress Notes (Signed)
6:45am Cardiac rehab Maintenance exercise class: Meredith Diaz reported as she was exercising into the third station she became dizzy and the room began to spin. She had an onset of nausea and went to the restroom and vomited. She said she believed symptoms to be caused by her vertigo. She was still feeling somewhat dizzy but no longer nauseated when reported to this Clinical research associate. Had her lay down in the treatment room. BP 136/77 HR 87.  Provided comfort measures of a cold compress and offered ginger ale but she declined and just wanted water. She admitted to working out harder this morning than she had the past couple weeks. After laying for approximately 15 minutes she sat up with some improvements. She has medications for vertigo and will take when home. Recheck BP while sitting 128/75 HR 84. She has an appointment with Primary Physician next week and will discuss recent vertigo episodes as she reports also experiencing at home, especially while laying down. Her husband dropped her off this morning and is picking her up from rehab as well.  Left department without any further issues.

## 2015-08-08 ENCOUNTER — Encounter (HOSPITAL_COMMUNITY): Payer: Self-pay

## 2015-08-10 ENCOUNTER — Encounter (HOSPITAL_COMMUNITY)
Admission: RE | Admit: 2015-08-10 | Discharge: 2015-08-10 | Disposition: A | Discharge status: Home / Self Care | Payer: Self-pay | Source: Ambulatory Visit | Attending: Cardiology | Admitting: Cardiology

## 2015-08-10 DIAGNOSIS — I5043 Acute on chronic combined systolic (congestive) and diastolic (congestive) heart failure: Secondary | ICD-10-CM | POA: Insufficient documentation

## 2015-08-12 ENCOUNTER — Encounter (HOSPITAL_COMMUNITY)
Admission: RE | Admit: 2015-08-12 | Discharge: 2015-08-12 | Disposition: A | Discharge status: Home / Self Care | Payer: Self-pay | Source: Ambulatory Visit | Attending: Cardiology | Admitting: Cardiology

## 2015-08-15 ENCOUNTER — Encounter (HOSPITAL_COMMUNITY)
Admission: RE | Admit: 2015-08-15 | Discharge: 2015-08-15 | Disposition: A | Discharge status: Home / Self Care | Payer: Self-pay | Source: Ambulatory Visit | Attending: Cardiology | Admitting: Cardiology

## 2015-08-16 ENCOUNTER — Encounter (HOSPITAL_COMMUNITY)
Admission: RE | Admit: 2015-08-16 | Discharge: 2015-08-16 | Disposition: A | Discharge status: Home / Self Care | Payer: Self-pay | Source: Ambulatory Visit | Attending: Cardiology | Admitting: Cardiology

## 2015-08-17 ENCOUNTER — Encounter (HOSPITAL_COMMUNITY)
Admission: RE | Admit: 2015-08-17 | Discharge: 2015-08-17 | Disposition: A | Discharge status: Home / Self Care | Payer: Self-pay | Source: Ambulatory Visit | Attending: Cardiology | Admitting: Cardiology

## 2015-08-19 ENCOUNTER — Encounter (HOSPITAL_COMMUNITY): Payer: Self-pay

## 2015-08-22 ENCOUNTER — Encounter (HOSPITAL_COMMUNITY): Payer: Self-pay

## 2015-08-24 ENCOUNTER — Ambulatory Visit (HOSPITAL_COMMUNITY)
Admission: RE | Admit: 2015-08-24 | Discharge: 2015-08-24 | Disposition: A | Discharge status: Home / Self Care | Payer: Commercial Managed Care - PPO | Source: Ambulatory Visit | Attending: Cardiology | Admitting: Cardiology

## 2015-08-24 ENCOUNTER — Encounter (HOSPITAL_COMMUNITY): Payer: Self-pay

## 2015-08-24 VITALS — BP 108/72 | HR 88 | Wt 189.5 lb

## 2015-08-24 DIAGNOSIS — I5022 Chronic systolic (congestive) heart failure: Secondary | ICD-10-CM | POA: Diagnosis present

## 2015-08-24 DIAGNOSIS — I428 Other cardiomyopathies: Secondary | ICD-10-CM | POA: Diagnosis not present

## 2015-08-24 DIAGNOSIS — Z79899 Other long term (current) drug therapy: Secondary | ICD-10-CM | POA: Insufficient documentation

## 2015-08-24 DIAGNOSIS — J45909 Unspecified asthma, uncomplicated: Secondary | ICD-10-CM | POA: Diagnosis not present

## 2015-08-24 DIAGNOSIS — F329 Major depressive disorder, single episode, unspecified: Secondary | ICD-10-CM | POA: Insufficient documentation

## 2015-08-24 DIAGNOSIS — I5042 Chronic combined systolic (congestive) and diastolic (congestive) heart failure: Secondary | ICD-10-CM

## 2015-08-24 DIAGNOSIS — E669 Obesity, unspecified: Secondary | ICD-10-CM | POA: Diagnosis not present

## 2015-08-24 DIAGNOSIS — G4733 Obstructive sleep apnea (adult) (pediatric): Secondary | ICD-10-CM | POA: Insufficient documentation

## 2015-08-24 DIAGNOSIS — Z8249 Family history of ischemic heart disease and other diseases of the circulatory system: Secondary | ICD-10-CM | POA: Insufficient documentation

## 2015-08-24 DIAGNOSIS — Z7951 Long term (current) use of inhaled steroids: Secondary | ICD-10-CM | POA: Insufficient documentation

## 2015-08-24 DIAGNOSIS — E039 Hypothyroidism, unspecified: Secondary | ICD-10-CM | POA: Insufficient documentation

## 2015-08-24 LAB — BASIC METABOLIC PANEL
ANION GAP: 10 (ref 5–15)
BUN: 15 mg/dL (ref 6–20)
CALCIUM: 8.6 mg/dL — AB (ref 8.9–10.3)
CO2: 24 mmol/L (ref 22–32)
CREATININE: 0.97 mg/dL (ref 0.44–1.00)
Chloride: 101 mmol/L (ref 101–111)
Glucose, Bld: 96 mg/dL (ref 65–99)
Potassium: 4 mmol/L (ref 3.5–5.1)
SODIUM: 135 mmol/L (ref 135–145)

## 2015-08-24 MED ORDER — TORSEMIDE 20 MG PO TABS: 20.0000 mg | ORAL_TABLET | Freq: Every day | ORAL | Status: DC

## 2015-08-24 NOTE — Patient Instructions (Signed)
Decrease Torsemide to 20 mg (1 tab) daily  Lab today  You have been referred to Diabetes and Nutrition, they will contact you to schedule  We will contact you in 6 months to schedule your next appointment and echocardiogram

## 2015-08-24 NOTE — Progress Notes (Signed)
Patient ID: Meredith Diaz, female   DOB: 1980-05-30, 35 y.o.   MRN: [id number] PCP: Dr. Okey Dupre HF Cardiology: Dr. Shirlee Latch  35 yo with history of nonischemic cardiomyopathy.  This was diagnosed in 2013.  EF was initially 25-30%.  Cardiac cath at that time showed no significant CAD.  Cardiac MRI showed no late gadoliniuim enhancement.  Since that time, EF has remained low but has improved a bit to 40-45%.  Most recent echo in 7/16 showed EF 40-45% with diffuse hypokinesis.  She also has asthma.  PFTs showed a severe restrictive defect that may be due to body habitus primarily.   She was admitted in 8/16 with chest pain.  CTA chest showed no PE, lung parenchyma looked normal.  She had ETT with poor exercise tolerance but no evidence for ischemia.  CPX was done in 12/16, showing moderate functional limitation due primarily to lung restriction.   Patient is stable symptomatically.  No chest pain.  She can walk about 100 yards before getting short of breath. She has mild dyspnea with stairs.  No orthopnea/PND.  No palpitations.  SBP runs in 90s at times at cardiac rehab.  Occasional mild lightheadedness.  Occasional wheezing, especially with weather changes.  She fatigues easily.     Labs (9/16): K 3.2, creatinine 1.11, hgb 14.3 Labs (12/16): K 3.3, creatinine 0.96, BNP 23 Labs (2/17): K 4.7, creatinine 0.95, TSH normal  PMH: 1. Chronic systolic CHF: Nonischemic cardiomyopathy.  - EF 25-30% by echo in 2013.  - Cardiac MRI (2013) with EF 52%, no delayed enhancement.  - LHC in 2013 without significant CAD. - 11/15 echo with EF 40-45%.   - 7/16 echo with EF 40-45%, grade II diastolic dysfunction.  - ETT (1/32) with poor exercise tolerance but no ischemia.  - CPX (12/16): peak VO2 15.7 (23.5 adjusted for ideal body weight), VE/VCO2 slope 21, RER 1.15 => moderate functional limitation thought to be primarily due to restrictive lung physiology.  2. Asthma: PFTs (4/16) with mild obstruction, severe  restriction and severely decreased DLCO => think primarily due to body habitus.  3. Depression 4. Hypothyroidism 5. 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  6. Angioedema with ACEI 7. OSA: CPAP.  8. Chronic leukocytosis: Negative workup.  9. BPPV  SH: Lives in [place], no smoking, rare ETOH, works as Programmer, multimedia.   FH: Father with CAD, grandfather and grandmother with MIs.  No cardiomyopathy.   ROS: All systems reviewed and negative except as per HPI.   Current Outpatient Prescriptions  Medication Sig Dispense Refill  . calcium carbonate (TUMS - DOSED IN MG ELEMENTAL CALCIUM) 500 MG chewable tablet Chew 1 tablet by mouth daily as needed for indigestion or heartburn. Reported on 06/15/2015    . cholestyramine light (PREVALITE) 4 GM/DOSE powder Take 4 g by mouth as needed (DIARRHEA DUE TO BILE ACIDS).     . clobetasol cream (TEMOVATE) 0.05 % Apply 1 application topically as needed (for Sclerosis).     . clonazePAM (KLONOPIN) 1 MG tablet Take 1 tablet (1 mg total) by mouth 2 (two) times daily. 60 tablet 5  . dicyclomine (BENTYL) 20 MG tablet Take 1 tablet (20 mg total) by mouth as needed for spasms. 60 tablet 3  . DULoxetine (CYMBALTA) 60 MG capsule Take 1 capsule (60 mg total) by mouth daily. 30 capsule 6  . ibuprofen (ADVIL,MOTRIN) 200 MG tablet Take 400 mg by mouth every 6 (six) hours as needed for moderate pain.    [name] levothyroxine (SYNTHROID,  LEVOTHROID) 88 MCG tablet Take 1 tablet (88 mcg total) by mouth daily. 90 tablet 3  . LORazepam (ATIVAN) 1 MG tablet Take 0.5-1 tablets (0.5-1 mg total) by mouth 3 (three) times daily as needed for anxiety. 90 tablet 0  . meclizine (ANTIVERT) 25 MG tablet Take 25 mg by mouth 3 (three) times daily as needed for dizziness.    . meloxicam (MOBIC) 15 MG tablet Take 15 mg by mouth daily as needed for pain.    . metoprolol succinate (TOPROL-XL) 50 MG 24 hr tablet Take 1 tablet (50 mg total) by mouth daily. Take with or immediately following a  meal. 90 tablet 3  . norethindrone-ethinyl estradiol 1/35 (ALAYCEN 1/35) tablet Take 1 tablet by mouth daily. 1 Package 12  . potassium chloride SA (K-DUR,KLOR-CON) 20 MEQ tablet Take 2 tablets (40 mEq total) by mouth daily. 60 tablet 6  . spironolactone (ALDACTONE) 25 MG tablet Take 1 tablet (25 mg total) by mouth daily. 30 tablet 6  . SYMBICORT 160-4.5 MCG/ACT inhaler INHALE 2 PUFFS INTO THE LUNGS DAILY. (Patient taking differently: INHALE 2 PUFFS INTO THE LUNGS TWICE DAILY) 10.2 Inhaler 9  . torsemide (DEMADEX) 20 MG tablet Take 1 tablet (20 mg total) by mouth daily. 60 tablet 0  . traZODone (DESYREL) 50 MG tablet Take 1/2-1 tablet by mouth in evening as needed for sleep 30 tablet 5  . valsartan (DIOVAN) 40 MG tablet Take 1 tablet (40 mg total) by mouth 2 (two) times daily. 60 tablet 6  . VENTOLIN HFA 108 (90 Base) MCG/ACT inhaler INHALE 2 PUFFS INTO THE LUNGS AS NEEDED FOR WHEEZING OR SHORTNESS OF BREATH. 18 Inhaler 2   No current facility-administered medications for this encounter.   BP 108/72 mmHg  Pulse 88  Wt 189 lb 8 oz (85.957 kg)  SpO2 93% General: NAD Neck: No JVD, no thyromegaly or thyroid nodule.  Lungs: Clear to auscultation bilaterally with normal respiratory effort. CV: Nondisplaced PMI.  Heart regular S1/S2, no S3/S4, no murmur.  No peripheral edema.  No carotid bruit.  Normal pedal pulses.  Abdomen: Soft, nontender, no hepatosplenomegaly, no distention.  Skin: Intact without lesions or rashes.  Neurologic: Alert and oriented x 3.  Psych: Normal affect. Extremities: No clubbing or cyanosis.  HEENT: Normal.   Assessment/Plan: 1. Chronic systolic CHF: Nonischemic cardiomyopathy.  No CAD on prior cath, prior cMRI showed no late gadolinium enhancement.  No family history of cardiomyopathy.  No ETOH/substance abuse.  No uncontrolled HTN.  Possible that she had viral myocarditis. CPX showed moderate functional limitation but this seems to be primarily due to lung restriction  (probably from body habitus).  NYHA class II symptoms.  Euvolemic on exam.  - Continue Toprol XL 50 mg daily.  - Continue spironolactone 25 daily, BMET today.   - Continue valsartan 40 mg bid.  She had angioedema with ACEI in the past (not recently).  The risk of angioedema with ARB in patients who had angioedema with ACEI is small.  I think that she benefits from ARB from the standpoint of her cardiomyopathy.  We have discussed the risks and benefits.  She remembers the symptoms of angioedema and knows to stop valsartan and seek medical care if she develops the symptoms.  I will not increase given low BP at times.  - I think that she can decrease torsemide to 20 mg once daily.  - Repeat echo in 6 months at time of followup appt.  2. Asthma: No active wheezing.  3.  Restrictive PFTs: Suspect primarily due to body habitus.  4. Obesity: I will refer to nutrition for help with diet.   Marca Ancona 08/24/2015

## 2015-08-26 ENCOUNTER — Encounter (HOSPITAL_COMMUNITY)
Admission: RE | Admit: 2015-08-26 | Discharge: 2015-08-26 | Disposition: A | Discharge status: Home / Self Care | Payer: Self-pay | Source: Ambulatory Visit | Attending: Cardiology | Admitting: Cardiology

## 2015-08-29 ENCOUNTER — Encounter (HOSPITAL_COMMUNITY)
Admission: RE | Admit: 2015-08-29 | Discharge: 2015-08-29 | Disposition: A | Discharge status: Home / Self Care | Payer: Self-pay | Source: Ambulatory Visit | Attending: Cardiology | Admitting: Cardiology

## 2015-08-31 ENCOUNTER — Encounter (HOSPITAL_COMMUNITY)
Admission: RE | Admit: 2015-08-31 | Discharge: 2015-08-31 | Disposition: A | Discharge status: Home / Self Care | Payer: Self-pay | Source: Ambulatory Visit | Attending: Cardiology | Admitting: Cardiology

## 2015-09-02 ENCOUNTER — Encounter (HOSPITAL_COMMUNITY): Payer: Self-pay

## 2015-09-05 ENCOUNTER — Other Ambulatory Visit: Payer: Self-pay | Admitting: Nurse Practitioner

## 2015-09-05 ENCOUNTER — Encounter (HOSPITAL_COMMUNITY)
Admission: RE | Admit: 2015-09-05 | Discharge: 2015-09-05 | Disposition: A | Discharge status: Home / Self Care | Payer: Self-pay | Source: Ambulatory Visit | Attending: Cardiology | Admitting: Cardiology

## 2015-09-05 DIAGNOSIS — I5043 Acute on chronic combined systolic (congestive) and diastolic (congestive) heart failure: Secondary | ICD-10-CM | POA: Insufficient documentation

## 2015-09-07 ENCOUNTER — Encounter (HOSPITAL_COMMUNITY)
Admission: RE | Admit: 2015-09-07 | Discharge: 2015-09-07 | Disposition: A | Discharge status: Home / Self Care | Payer: Self-pay | Source: Ambulatory Visit | Attending: Cardiology | Admitting: Cardiology

## 2015-09-09 ENCOUNTER — Encounter (HOSPITAL_COMMUNITY)
Admission: RE | Admit: 2015-09-09 | Discharge: 2015-09-09 | Disposition: A | Discharge status: Home / Self Care | Payer: Self-pay | Source: Ambulatory Visit | Attending: Cardiology | Admitting: Cardiology

## 2015-09-12 ENCOUNTER — Encounter (HOSPITAL_COMMUNITY)
Admission: RE | Admit: 2015-09-12 | Discharge: 2015-09-12 | Disposition: A | Discharge status: Home / Self Care | Payer: Self-pay | Source: Ambulatory Visit | Attending: Cardiology | Admitting: Cardiology

## 2015-09-13 ENCOUNTER — Ambulatory Visit: Payer: Commercial Managed Care - PPO | Admitting: Internal Medicine

## 2015-09-14 ENCOUNTER — Encounter (HOSPITAL_COMMUNITY)
Admission: RE | Admit: 2015-09-14 | Discharge: 2015-09-14 | Disposition: A | Discharge status: Home / Self Care | Payer: Self-pay | Source: Ambulatory Visit | Attending: Cardiology | Admitting: Cardiology

## 2015-09-16 ENCOUNTER — Encounter (HOSPITAL_COMMUNITY)
Admission: RE | Admit: 2015-09-16 | Discharge: 2015-09-16 | Disposition: A | Discharge status: Home / Self Care | Payer: Self-pay | Source: Ambulatory Visit | Attending: Cardiology | Admitting: Cardiology

## 2015-09-19 ENCOUNTER — Encounter (HOSPITAL_COMMUNITY)
Admission: RE | Admit: 2015-09-19 | Discharge: 2015-09-19 | Disposition: A | Discharge status: Home / Self Care | Payer: Self-pay | Source: Ambulatory Visit | Attending: Cardiology | Admitting: Cardiology

## 2015-09-21 ENCOUNTER — Encounter (HOSPITAL_COMMUNITY)
Admission: RE | Admit: 2015-09-21 | Discharge: 2015-09-21 | Disposition: A | Discharge status: Home / Self Care | Payer: Self-pay | Source: Ambulatory Visit | Attending: Cardiology | Admitting: Cardiology

## 2015-09-22 ENCOUNTER — Ambulatory Visit: Payer: Commercial Managed Care - PPO | Admitting: Pulmonary Disease

## 2015-09-23 ENCOUNTER — Encounter (HOSPITAL_COMMUNITY)
Admission: RE | Admit: 2015-09-23 | Discharge: 2015-09-23 | Disposition: A | Discharge status: Home / Self Care | Payer: Self-pay | Source: Ambulatory Visit | Attending: Cardiology | Admitting: Cardiology

## 2015-09-26 ENCOUNTER — Ambulatory Visit (INDEPENDENT_AMBULATORY_CARE_PROVIDER_SITE_OTHER): Payer: Commercial Managed Care - PPO | Admitting: Internal Medicine

## 2015-09-26 ENCOUNTER — Encounter (HOSPITAL_COMMUNITY)
Admission: RE | Admit: 2015-09-26 | Discharge: 2015-09-26 | Disposition: A | Discharge status: Home / Self Care | Payer: Self-pay | Source: Ambulatory Visit | Attending: Cardiology | Admitting: Cardiology

## 2015-09-26 ENCOUNTER — Other Ambulatory Visit (INDEPENDENT_AMBULATORY_CARE_PROVIDER_SITE_OTHER): Payer: Commercial Managed Care - PPO

## 2015-09-26 ENCOUNTER — Encounter: Payer: Self-pay | Admitting: Internal Medicine

## 2015-09-26 VITALS — BP 136/84 | HR 99 | Temp 98.1°F | Resp 14 | Ht 60.0 in | Wt 187.0 lb

## 2015-09-26 DIAGNOSIS — E039 Hypothyroidism, unspecified: Secondary | ICD-10-CM

## 2015-09-26 DIAGNOSIS — Z23 Encounter for immunization: Secondary | ICD-10-CM | POA: Diagnosis not present

## 2015-09-26 DIAGNOSIS — R202 Paresthesia of skin: Secondary | ICD-10-CM | POA: Diagnosis not present

## 2015-09-26 DIAGNOSIS — I10 Essential (primary) hypertension: Secondary | ICD-10-CM

## 2015-09-26 DIAGNOSIS — E538 Deficiency of other specified B group vitamins: Secondary | ICD-10-CM | POA: Diagnosis not present

## 2015-09-26 LAB — T4, FREE: FREE T4: 0.85 ng/dL (ref 0.60–1.60)

## 2015-09-26 LAB — C-REACTIVE PROTEIN: CRP: 4.9 mg/dL (ref 0.5–20.0)

## 2015-09-26 LAB — RHEUMATOID FACTOR: Rhuematoid fact SerPl-aCnc: <10 IU/mL (ref <=14)

## 2015-09-26 LAB — TSH: TSH: 2.55 u[IU]/mL (ref 0.35–4.50)

## 2015-09-26 LAB — VITAMIN B12: VITAMIN B 12: 267 pg/mL (ref 211–911)

## 2015-09-26 MED ORDER — CYANOCOBALAMIN 1000 MCG/ML IJ SOLN
1000.0000 ug | Freq: Once | INTRAMUSCULAR | Status: AC
  Administered 2015-09-26: 1000 ug via INTRAMUSCULAR

## 2015-09-26 NOTE — Assessment & Plan Note (Signed)
Synthroid was increased to 88 mcg daily at last visit and checking TSH and free T4 to see if levels are therapeutic.

## 2015-09-26 NOTE — Patient Instructions (Signed)
We have checked the labs today and will send the results on mychart.   We have given you the B12 shot and the tetanus. Let us know how you are feeling later this week and if you are feeling a lot better we know that the B12 has helped.   Congratulations on the 5K, keep up the good work!

## 2015-09-26 NOTE — Assessment & Plan Note (Signed)
Could be related to thyroid or B12 levels and checking both of those today. Also checking ANA, RF, CRP. Has had elevated WBC in the past several times. No weight loss or eye symptoms. If no etiology can consider referral to rheumatology.

## 2015-09-26 NOTE — Assessment & Plan Note (Signed)
BP at goal on her metoprolol and torsemide and spironolactone and valsartan. No adjustments today.

## 2015-09-26 NOTE — Assessment & Plan Note (Signed)
Checking B12 levels today as they could be the cause of the tingling then was given B12 shot today to watch for clinical improvement.

## 2015-09-26 NOTE — Progress Notes (Signed)
Pre visit review using our clinic review tool, if applicable. No additional management support is needed unless otherwise documented below in the visit note. 

## 2015-09-26 NOTE — Progress Notes (Signed)
   Subjective:    Patient ID: Meredith Diaz, female    DOB: 1980/08/18, 35 y.o.   MRN: [id number]  HPI The patient is a 35 YO female coming in for follow up of her thyroid as well as some new tingling in her hands and feet. She is still taking the daily B12 pill. Has not noticed a difference in the new dosage of her medicine for thyroid. No weight change. Still some fatigue most of the time. Completed a 5K this weekend. Talked to someone at cardiac rehab and they recommended she see a rheumatologist. She also has mild tremor which is present only some of the time.   Review of Systems  Constitutional: Positive for fatigue. Negative for fever, chills, appetite change and unexpected weight change.  HENT: Negative.   Eyes: Negative.   Respiratory: Positive for shortness of breath. Negative for cough, chest tightness and wheezing.   Cardiovascular: Negative for chest pain, palpitations and leg swelling.  Gastrointestinal: Negative for nausea, diarrhea, constipation, blood in stool and abdominal distention.  Musculoskeletal: Positive for myalgias, back pain and arthralgias. Negative for gait problem.  Neurological: Positive for tremors, weakness and numbness. Negative for dizziness and headaches.  Psychiatric/Behavioral: Positive for decreased concentration. Negative for suicidal ideas and self-injury. The patient is nervous/anxious.        Objective:   Physical Exam  Constitutional: She is oriented to person, place, and time. She appears well-developed and well-nourished. No distress.  Overweight  HENT:  Head: Normocephalic and atraumatic.  Right Ear: External ear normal.  Left Ear: External ear normal.  Mouth/Throat: Oropharynx is clear and moist.  Eyes: EOM are normal.  Neck: Normal range of motion.  Cardiovascular: Normal rate and regular rhythm.   Pulmonary/Chest: Effort normal and breath sounds normal. No respiratory distress. She has no wheezes. She has no rales.  Abdominal: Soft.  Bowel sounds are normal. She exhibits no distension and no mass. There is no tenderness. There is no rebound and no guarding.  Neurological: She is alert and oriented to person, place, and time. Coordination normal.  Skin: Skin is warm and dry.   Filed Vitals:   09/26/15 1012  BP: 136/84  Pulse: 99  Temp: 98.1 F (36.7 C)  TempSrc: Oral  Resp: 14  Height: 5' (1.524 m)  Weight: 187 lb (84.823 kg)  SpO2: 97%      Assessment & Plan:  Tdap and B12 shot given at visit.

## 2015-09-27 ENCOUNTER — Ambulatory Visit: Payer: Commercial Managed Care - PPO | Admitting: Pulmonary Disease

## 2015-09-27 LAB — ANA: Anti Nuclear Antibody(ANA): NEGATIVE

## 2015-09-28 ENCOUNTER — Encounter (HOSPITAL_COMMUNITY)
Admission: RE | Admit: 2015-09-28 | Discharge: 2015-09-28 | Disposition: A | Discharge status: Home / Self Care | Payer: Self-pay | Source: Ambulatory Visit | Attending: Cardiology | Admitting: Cardiology

## 2015-09-30 ENCOUNTER — Encounter (HOSPITAL_COMMUNITY)
Admission: RE | Admit: 2015-09-30 | Discharge: 2015-09-30 | Disposition: A | Discharge status: Home / Self Care | Payer: Self-pay | Source: Ambulatory Visit | Attending: Cardiology | Admitting: Cardiology

## 2015-10-02 ENCOUNTER — Other Ambulatory Visit: Payer: Self-pay | Admitting: Cardiology

## 2015-10-02 ENCOUNTER — Other Ambulatory Visit: Payer: Self-pay | Admitting: Internal Medicine

## 2015-10-04 ENCOUNTER — Other Ambulatory Visit: Payer: Self-pay | Admitting: Internal Medicine

## 2015-10-04 MED ORDER — LEVOTHYROXINE SODIUM 100 MCG PO TABS: 100.0000 ug | ORAL_TABLET | Freq: Every day | ORAL | Status: DC

## 2015-10-05 ENCOUNTER — Encounter (HOSPITAL_COMMUNITY)
Admission: RE | Admit: 2015-10-05 | Discharge: 2015-10-05 | Disposition: A | Discharge status: Home / Self Care | Payer: Self-pay | Source: Ambulatory Visit | Attending: Cardiology | Admitting: Cardiology

## 2015-10-07 ENCOUNTER — Encounter (HOSPITAL_COMMUNITY)
Admission: RE | Admit: 2015-10-07 | Discharge: 2015-10-07 | Disposition: A | Discharge status: Home / Self Care | Payer: Self-pay | Source: Ambulatory Visit | Attending: Cardiology | Admitting: Cardiology

## 2015-10-07 DIAGNOSIS — I5043 Acute on chronic combined systolic (congestive) and diastolic (congestive) heart failure: Secondary | ICD-10-CM | POA: Insufficient documentation

## 2015-10-10 ENCOUNTER — Encounter (HOSPITAL_COMMUNITY): Payer: Self-pay

## 2015-10-10 ENCOUNTER — Telehealth: Payer: Self-pay | Admitting: Internal Medicine

## 2015-10-10 NOTE — Telephone Encounter (Signed)
Pt was wondering how often she should get her B12 injection done? The got one on 09/26/15. Please advise.

## 2015-10-10 NOTE — Telephone Encounter (Signed)
She should get every 2 weeks for 1 month, then monthly.

## 2015-10-10 NOTE — Telephone Encounter (Signed)
Pt aware. 2nd dose schedule on 10/11/15.

## 2015-10-11 ENCOUNTER — Ambulatory Visit (INDEPENDENT_AMBULATORY_CARE_PROVIDER_SITE_OTHER)
Admission: RE | Admit: 2015-10-11 | Discharge: 2015-10-11 | Disposition: A | Discharge status: Home / Self Care | Payer: Commercial Managed Care - PPO | Source: Ambulatory Visit | Attending: Family Medicine | Admitting: Family Medicine

## 2015-10-11 ENCOUNTER — Other Ambulatory Visit: Payer: Self-pay | Admitting: *Deleted

## 2015-10-11 ENCOUNTER — Telehealth: Payer: Self-pay

## 2015-10-11 ENCOUNTER — Ambulatory Visit (INDEPENDENT_AMBULATORY_CARE_PROVIDER_SITE_OTHER): Payer: Commercial Managed Care - PPO

## 2015-10-11 DIAGNOSIS — M79672 Pain in left foot: Secondary | ICD-10-CM

## 2015-10-11 DIAGNOSIS — M25562 Pain in left knee: Secondary | ICD-10-CM

## 2015-10-11 DIAGNOSIS — E538 Deficiency of other specified B group vitamins: Secondary | ICD-10-CM | POA: Diagnosis not present

## 2015-10-11 MED ORDER — CYANOCOBALAMIN 1000 MCG/ML IJ SOLN
1000.0000 ug | Freq: Once | INTRAMUSCULAR | Status: AC
  Administered 2015-10-11: 1000 ug via INTRAMUSCULAR

## 2015-10-11 NOTE — Telephone Encounter (Signed)
Spoke with patient. Has appt scheduled for 10/12/15 at 9:15am to see Dr. Katrinka Blazing

## 2015-10-12 ENCOUNTER — Ambulatory Visit (INDEPENDENT_AMBULATORY_CARE_PROVIDER_SITE_OTHER)
Admission: RE | Admit: 2015-10-12 | Discharge: 2015-10-12 | Disposition: A | Discharge status: Home / Self Care | Payer: Commercial Managed Care - PPO | Source: Ambulatory Visit | Attending: Family Medicine | Admitting: Family Medicine

## 2015-10-12 ENCOUNTER — Encounter: Payer: Self-pay | Admitting: *Deleted

## 2015-10-12 ENCOUNTER — Ambulatory Visit (INDEPENDENT_AMBULATORY_CARE_PROVIDER_SITE_OTHER): Payer: Commercial Managed Care - PPO | Admitting: Family Medicine

## 2015-10-12 ENCOUNTER — Encounter (HOSPITAL_COMMUNITY): Payer: Self-pay

## 2015-10-12 ENCOUNTER — Encounter: Payer: Self-pay | Admitting: Family Medicine

## 2015-10-12 ENCOUNTER — Ambulatory Visit: Payer: Self-pay

## 2015-10-12 VITALS — BP 112/78 | HR 85 | Ht 60.0 in | Wt 190.0 lb

## 2015-10-12 DIAGNOSIS — M5416 Radiculopathy, lumbar region: Secondary | ICD-10-CM | POA: Diagnosis not present

## 2015-10-12 DIAGNOSIS — M7642 Tibial collateral bursitis [Pellegrini-Stieda], left leg: Secondary | ICD-10-CM | POA: Diagnosis not present

## 2015-10-12 DIAGNOSIS — M25562 Pain in left knee: Secondary | ICD-10-CM

## 2015-10-12 DIAGNOSIS — S8002XA Contusion of left knee, initial encounter: Secondary | ICD-10-CM | POA: Diagnosis not present

## 2015-10-12 MED ORDER — PREDNISONE 50 MG PO TABS: 50.0000 mg | ORAL_TABLET | Freq: Every day | ORAL | Status: DC

## 2015-10-12 MED ORDER — GABAPENTIN 100 MG PO CAPS: 200.0000 mg | ORAL_CAPSULE | Freq: Every day | ORAL | Status: DC

## 2015-10-12 NOTE — Assessment & Plan Note (Signed)
Patient does have a left knee contusion. Large hematoma noted. Patient does not have significant swelling within the knee joint itself. Patient does have insufficiency and chronic degenerative changes of her MCL that does give her some mild instability. We discussed with patient about different treatment options and patient elected try conservative therapy. Patient may need bracing but I would consider custom bracing secondary to her thigh to calf ratio. Patient will try icing as well as topical medications to help with the pain initially. If having difficulty we may need to consider further imaging. X-rays were fairly unremarkable except for chronic changes of the MCL.

## 2015-10-12 NOTE — Patient Instructions (Signed)
Good to see you.  Xray downstairs Ice 20 minutes 2 times daily. Usually after activity and before bed. Prednisone daily for 5 days.  Sorry Gabapentin 200mg  at night  Avoid any heavy lifting  Arnica lotion to the knee for the bruise.  Turmeric 500mg  daily  Vitamin D 2000 IU daily  Iron 65mg  daily with 500mg  of vitamin C to help with the cramping in legs See me again in 10-14 days and then we will likely get you in physical therapy for the back.

## 2015-10-12 NOTE — Progress Notes (Signed)
Pre visit review using our clinic review tool, if applicable. No additional management support is needed unless otherwise documented below in the visit note. 

## 2015-10-12 NOTE — Progress Notes (Signed)
[facility] [address] [place], [place] [address] Phone: [phone] Subjective:    I'm seeing this patient by the request  of:  Myrlene Broker, MD   CC: Left knee and foot pain   [id number] Meredith Diaz is a 35 y.o. female coming in with complaint of left knee and foot pain. Patient did fall recently. Attempted to catch herself and fell directly on the anterior aspect of her knee bilaterally. Patient states since almost immediately she had severe pain in the left knee. Patient does have a past medical history she states of a partial anterior cruciate ligament tear 15 years ago. No surgical intervention was done. States that this pain is more on the anterior aspect of the knee and the medial aspect. Sometimes feels that she has instability. States that the swelling and bruising was worse initially when she did fall 4 days ago and seems to be improving slowly. Is able to bear weight but does have significant pain. No locking on her and she has not fallen secondary to the knee.  X-rays patient had 3 days ago were independently visualized by me showing a calcific changes of the MCL that appears to be chronic but otherwise fairly unremarkable.  Patient also complains a left foot numbness. States it is not as much pain. States that it seems to be giving her more difficulty with walking longer distances. Patient states that it seems to go from the posterior aspect of the leg. Sometimes feels a little weak as well. No swelling but states that it does seem to have a fullness feeling on the top of the foot. There is some bruising previously.   patient had x-rays of the left foot that were independently visualized by me. Left foot x-ray show the patient does have variant of the fourth and fifth metatarsal with shortening but otherwise unremarkable.  Past Medical History  Diagnosis Date  . Chronic combined systolic and diastolic CHF (congestive heart failure)  (HCC) 07/2011    a. Prior Echo: EF was 25-30%; b. No Sig CAD on CATH; c. No significant finding on Cardiac MRI; d. follow-up Echo 2014 --  EF of 50%;  e. 03/2014 Echo: EF 40-45%, gr 1 DD; f. 11/2014 Echo: EF 40-45%, Gr 2 DD.  [name] Asthma   . Thyroid disease   . Depression   . Anxiety   . Stomach problems   . Essential hypertension   . Congenital heart disease, adult     a. 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  . Nonischemic cardiomyopathy (HCC)     a. unclear etiology;  b. 01/2012 Cardiac MRI: no infiltrative pathology, EF 52%;  b. 11/2014 Echo: EF 40-45%, gr 2 DD.  . Morbid obesity [facility])    Past Surgical History  Procedure Laterality Date  . Cholecystectomy  2013  . Cardiac catheterization  08/2011    Normal Coronaries - LVEDP 32 mmHg  . Transthoracic echocardiogram  07/2011    Moderate concentric LVH, mildly dilated. EF 25-30% no diastolic dysfunction parameters to suggest elevated LAP  . Cardiac mri  April 2013    No suggestion of a congenital abnormality; EF ~ 52% - no regional wall motion abnormalities., no increased myocardial signal intensity. No perfusion abnormality.  . Transthoracic echocardiogram  December 2015; July 2016    a)  EF by echo 40-45% with global  hypokinesis, mild LVH possible apical variant hypertrophic cardiomyopathy;; b)  EF 40-45%. Normal wall thickness (compared to prior reading of possible apical LVH), grade 2 DD -- with elevated LV filling pressures (however left atrium was read as normal size), normal artery function and size. No no suggestion of pulmonary hypertension   Social History   Social History  . Marital Status: Married    Spouse Name: N/A  . Number of Children: N/A  . Years of Education: N/A   Occupational History  . editor    Social History Main Topics  . Smoking status: Never Smoker   . Smokeless tobacco: None  . Alcohol  Use: No  . Drug Use: No  . Sexual Activity: Not Asked   Other Topics Concern  . None   Social History Narrative   Allergies  Allergen Reactions  . Lisinopril Swelling    Angioedema facial, mostly just lips, no trouble breathing   Family History  Problem Relation Age of Onset  . Depression Mother   . Hypertension Father   . Heart disease Maternal Grandfather   . Heart disease Paternal Grandmother   . Breast cancer Paternal Grandmother   . Colon cancer Maternal Grandmother   . Asthma Paternal Grandmother   . Heart attack Maternal Grandfather   . Heart attack Paternal Grandmother   . Heart attack Paternal Grandfather   . Stroke Neg Hx     Past medical history, social, surgical and family history all reviewed in electronic medical record.  No pertanent information unless stated regarding to the chief complaint.   Review of Systems: No headache, visual changes, nausea, vomiting, diarrhea, constipation, dizziness, abdominal pain, skin rash, fevers, chills, night sweats, weight loss, swollen lymph nodes, body aches, joint swelling, muscle aches, chest pain, shortness of breath, mood changes.   Objective Blood pressure 112/78, pulse 85, height 5' (1.524 m), weight 190 lb (86.183 kg), last menstrual period 09/20/2015, SpO2 97 %.  General: No apparent distress alert and oriented x3 mood and affect normal, dressed appropriately.  HEENT: Pupils equal, extraocular movements intact  Respiratory: Patient's speak in full sentences and does not appear short of breath  Cardiovascular: No lower extremity edema, non tender, no erythema  Skin: Warm dry intact with no signs of infection or rash on extremities or on axial skeleton.  Abdomen: Soft nontender  Neuro: Cranial nerves II through XII are intact, neurovascularly intact in all extremities with 2+ DTRs and 2+ pulses.  Lymph: No lymphadenopathy of posterior or anterior cervical chain or axillae bilaterally.  Gait antalgic gait MSK:  Non  tender with full range of motion and good stability and symmetric strength and tone of shoulders, elbows, wrist, hip, and ankles bilaterally.  Knee: Left Large contusion noted on the anterior aspect of the knee Generalized tenderness to palpation over the medial and lateral as well as anterior aspect of the knee ROM full in flexion and extension and lower leg rotation. Instability with mild laxity of the MCL compared to the contralateral side Negative Mcmurray's, Apley's, and Thessalonian tests. painful patellar compression. Patellar glide with mild crepitus. Patellar and quadriceps tendons unremarkable. Hamstring and quadriceps strength is normal.  Contralateral knee also has a contusion on the front of the knee that is tender but otherwise unremarkable on exam.  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 Patient does have mild supination of the hindfoot bilaterally left couldn't and right. Mild splaying between the first and second toes. Morton's toe noted  on the right foot. Generalized tenderness to palpation of the foot but states that there is numbness on the medial aspect.  Back exam is consistent with scoliosis. Patient does have a positive straight leg test that is severe on the left side. Seems to make the numbness worse in the foot. Tender to palpation in the paraspinal musculature of the lumbar spine.  MSK US performed of: Left knee This study was ordered, performed, and interpreted by Terrilee Files D.O.  Knee: All structures visualized. Mild degenerative changes of the medial meniscus but no significant displacement or acute tear Patellar Tendon unremarkable on long and transverse views without effusion. No abnormality of prepatellar bursa. MCL does have degenerative changes some new acute tear but does appear to have a chronic proximal articular side tear noted but not full-thickness.  IMPRESSION:  Chronic MCL insufficiency  but otherwise fairly unremarkable.     Impression and Recommendations:     This case required medical decision making of moderate complexity.      Note: This dictation was prepared with Dragon dictation along with smaller phrase technology. Any transcriptional errors that result from this process are unintentional.

## 2015-10-12 NOTE — Assessment & Plan Note (Signed)
I believe the patient does have more of a lumbar radiculopathy. Patient's does have a positive straight leg test. I think that this is what is causing the numbness in her foot. Patient will be treated with prednisone, icing regimen, gabapentin. We will continue low-dose secondary to patient's history of chronic heart failure. Discussed with patient she may need to increase diuretic for short term. We discussed icing regimen of the back as well as the knee. Patient will come back and see me again in 10-14 days and if doing better we will get her into formal physical therapy. Patient does have a past mental history significant for scoliosis she states.

## 2015-10-14 ENCOUNTER — Encounter (HOSPITAL_COMMUNITY): Payer: Self-pay

## 2015-10-16 ENCOUNTER — Other Ambulatory Visit (HOSPITAL_COMMUNITY): Payer: Self-pay | Admitting: Cardiology

## 2015-10-17 ENCOUNTER — Encounter (HOSPITAL_COMMUNITY)
Admission: RE | Admit: 2015-10-17 | Discharge: 2015-10-17 | Disposition: A | Discharge status: Home / Self Care | Payer: Self-pay | Source: Ambulatory Visit | Attending: Cardiology | Admitting: Cardiology

## 2015-10-17 ENCOUNTER — Other Ambulatory Visit: Payer: Self-pay | Admitting: Pulmonary Disease

## 2015-10-19 ENCOUNTER — Encounter (HOSPITAL_COMMUNITY)
Admission: RE | Admit: 2015-10-19 | Discharge: 2015-10-19 | Disposition: A | Discharge status: Home / Self Care | Payer: Self-pay | Source: Ambulatory Visit | Attending: Cardiology | Admitting: Cardiology

## 2015-10-19 NOTE — Progress Notes (Signed)
Nutrition Note Spoke with pt. Pt states she has lost 25 lb over the past 10 months and would like to lose 30 lb by the anniversary of her heart event. Discussed pt returning to keeping a food journal, watching portion sizes and exercising to exercise goals set by pt and EP. Pt does yoga and water aerobics on her non-rehab days. Gastric bypass discussed as a tool to help pt manage her long-term health.  Pt expressed understanding of the information reviewed via feedback method. Continue client-centered nutrition education by RD as part of interdisciplinary care.  Monitor and evaluate progress toward nutrition goal with team.  Mickle Plumb, M.Ed, RD, LDN, CDE 10/19/2015 8:25 AM

## 2015-10-21 ENCOUNTER — Encounter (HOSPITAL_COMMUNITY)
Admission: RE | Admit: 2015-10-21 | Discharge: 2015-10-21 | Disposition: A | Discharge status: Home / Self Care | Payer: Self-pay | Source: Ambulatory Visit | Attending: Cardiology | Admitting: Cardiology

## 2015-10-24 ENCOUNTER — Encounter (HOSPITAL_COMMUNITY)
Admission: RE | Admit: 2015-10-24 | Discharge: 2015-10-24 | Disposition: A | Discharge status: Home / Self Care | Payer: Self-pay | Source: Ambulatory Visit | Attending: Cardiology | Admitting: Cardiology

## 2015-10-25 ENCOUNTER — Encounter: Payer: Self-pay | Admitting: Family Medicine

## 2015-10-25 ENCOUNTER — Ambulatory Visit (INDEPENDENT_AMBULATORY_CARE_PROVIDER_SITE_OTHER): Payer: Commercial Managed Care - PPO | Admitting: Family Medicine

## 2015-10-25 VITALS — BP 120/82 | HR 98 | Ht 60.0 in | Wt 188.0 lb

## 2015-10-25 DIAGNOSIS — M5416 Radiculopathy, lumbar region: Secondary | ICD-10-CM

## 2015-10-25 DIAGNOSIS — M7642 Tibial collateral bursitis [Pellegrini-Stieda], left leg: Secondary | ICD-10-CM

## 2015-10-25 DIAGNOSIS — M4120 Other idiopathic scoliosis, site unspecified: Secondary | ICD-10-CM

## 2015-10-25 NOTE — Assessment & Plan Note (Signed)
Stable to course strength will be important. No further workup at this time but formal physical therapy will be beneficial.

## 2015-10-25 NOTE — Assessment & Plan Note (Signed)
We'll continue to monitor. Seems to be chronic in nature. We discussed possible custom bracing necessary. Patient wants to avoid that at this moment. Hopefully she'll continue to improve.

## 2015-10-25 NOTE — Progress Notes (Signed)
[facility] [address] [place], [place] [address] Phone: [phone] Subjective:    I'm seeing this patient by the request  of:  Myrlene Broker, MD   CC: Left knee and foot pain Follow-up  [id number] Meredith Diaz is a 35 y.o. female coming in with complaint of left knee and foot pain. Patient was found to have an MCL chronic degenerative change and likely result. I believe the patient did have more of a knee sprain. Patient seems to be doing better. Patient states that it is less painful and not having any more swelling. Starting to increase her activity a little bit. Has been doing some the exercises but feels that she needs more guidance. Not wearing the brace on a regular basis. Symptoms.   Patient also complains a left foot numbness. Patient was found to actually have more of a lumbar radiculopathy with patient's history of scoliosis. Patient was treated appropriately started on gabapentin. States that this has resolved most of the left foot numbness at this time. He is feeling significantly better. Still has some mild discomfort in the back but is 60-70% improved. Patient denies any new symptoms. Denies any bowel or bladder incontinence. Overall feels like she is making progress.     Past Medical History  Diagnosis Date  . Chronic combined systolic and diastolic CHF (congestive heart failure) (HCC) 07/2011    a. Prior Echo: EF was 25-30%; b. No Sig CAD on CATH; c. No significant finding on Cardiac MRI; d. follow-up Echo 2014 --  EF of 50%;  e. 03/2014 Echo: EF 40-45%, gr 1 DD; f. 11/2014 Echo: EF 40-45%, Gr 2 DD.  [name] Asthma   . Thyroid disease   . Depression   . Anxiety   . Stomach problems   . Essential hypertension   . Congenital heart disease, adult     a. 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  . Nonischemic  cardiomyopathy (HCC)     a. unclear etiology;  b. 01/2012 Cardiac MRI: no infiltrative pathology, EF 52%;  b. 11/2014 Echo: EF 40-45%, gr 2 DD.  . Morbid obesity [facility])    Past Surgical History  Procedure Laterality Date  . Cholecystectomy  2013  . Cardiac catheterization  08/2011    Normal Coronaries - LVEDP 32 mmHg  . Transthoracic echocardiogram  07/2011    Moderate concentric LVH, mildly dilated. EF 25-30% no diastolic dysfunction parameters to suggest elevated LAP  . Cardiac mri  April 2013    No suggestion of a congenital abnormality; EF ~ 52% - no regional wall motion abnormalities., no increased myocardial signal intensity. No perfusion abnormality.  . 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 Normal wall thickness (compared to prior reading of possible apical LVH), grade 2 DD -- with elevated LV filling pressures (however left atrium was read as normal size), normal artery function and size. No no suggestion of pulmonary hypertension   Social History   Social History  . Marital Status: Married    Spouse Name: N/A  . Number of Children: N/A  . Years of Education: N/A   Occupational History  . editor    Social History Main Topics  . Smoking status: Never Smoker   .  Smokeless tobacco: None  . Alcohol Use: No  . Drug Use: No  . Sexual Activity: Not Asked   Other Topics Concern  . None   Social History Narrative   Allergies  Allergen Reactions  . Lisinopril Swelling    Angioedema facial, mostly just lips, no trouble breathing   Family History  Problem Relation Age of Onset  . Depression Mother   . Hypertension Father   . Heart disease Maternal Grandfather   . Heart disease Paternal Grandmother   . Breast cancer Paternal Grandmother   . Colon cancer Maternal Grandmother   . Asthma Paternal Grandmother   . Heart attack Maternal  Grandfather   . Heart attack Paternal Grandmother   . Heart attack Paternal Grandfather   . Stroke Neg Hx     Past medical history, social, surgical and family history all reviewed in electronic medical record.  No pertanent information unless stated regarding to the chief complaint.   Review of Systems: No headache, visual changes, nausea, vomiting, diarrhea, constipation, dizziness, abdominal pain, skin rash, fevers, chills, night sweats, weight loss, swollen lymph nodes, body aches, joint swelling, muscle aches, chest pain, shortness of breath, mood changes.   Objective Blood pressure 120/82, pulse 98, height 5' (1.524 m), weight 188 lb (85.276 kg), last menstrual period 09/20/2015, SpO2 93 %.  General: No apparent distress alert and oriented x3 mood and affect normal, dressed appropriately.  HEENT: Pupils equal, extraocular movements intact  Respiratory: Patient's speak in full sentences and does not appear short of breath  Cardiovascular: No lower extremity edema, non tender, no erythema  Skin: Warm dry intact with no signs of infection or rash on extremities or on axial skeleton.  Abdomen: Soft nontender  Neuro: Cranial nerves II through XII are intact, neurovascularly intact in all extremities with 2+ DTRs and 2+ pulses.  Lymph: No lymphadenopathy of posterior or anterior cervical chain or axillae bilaterally.  Gait antalgic gait MSK:  Non tender with full range of motion and good stability and symmetric strength and tone of shoulders, elbows, wrist, hip, and ankles bilaterally.  Knee: Left Contusion resolved Minimal tenderness still noted over the medial joint line ROM full in flexion and extension and lower leg rotation. Continues to have some instability and laxity of the MCL compared to contralateral side Negative Mcmurray's, Apley's, and Thessalonian tests. painful patellar compression. Patellar glide with very mild crepitus. Patellar and quadriceps tendons  unremarkable. Hamstring and quadriceps strength is normal.  Contralateral knee also has a contusion on the front of the knee that is tender but otherwise unremarkable on exam.  Improvement from previous exam.   Back exam is consistent with scoliosis. Negative straight leg test but patient does have tightness of the hamstring compared to the contralateral side. No numbness noted in the foot today. Mild tenderness still remaining and appears palmar musculature of the lumbar spine but improved range of motion still lacking the last 5 of extension and 5 of flexion..     Impression and Recommendations:     This case required medical decision making of moderate complexity.      Note: This dictation was prepared with Dragon dictation along with smaller phrase technology. Any transcriptional errors that result from this process are unintentional.

## 2015-10-25 NOTE — Assessment & Plan Note (Signed)
Seems to be making some significant improvement at this time. No longer having a positive straight leg test but still has some discomfort. With patient's underlying scoliosis will be important to do core strengthening and hip abductor strengthening. Will be sent to formal physical therapy for further treatment. Encourage her to continue the over-the-counter medications. No longer taking the prednisone but did help her initially. Patient encouraged to continue the gabapentin and was given a titration to decrease over the course of next several weeks. If worsening symptoms she knows to go back to the higher dosing. Patient come back and see me again in 6 weeks for further evaluation and treatment.  Spent  25 minutes with patient face-to-face and had greater than 50% of counseling including as described above in assessment and plan.

## 2015-10-25 NOTE — Patient Instructions (Signed)
Good to see you  You are making progress.  Ice is your friend Try to decrease gabapentin to 100mg  for a week.  If worsening pain then go back to 200mg  If still doing good after 1 week then stop it and do the same thing Physical therapy will be calling you.  Continue the vitamins See me again in 6 weeks.

## 2015-10-25 NOTE — Progress Notes (Signed)
Pre visit review using our clinic review tool, if applicable. No additional management support is needed unless otherwise documented below in the visit note. 

## 2015-10-26 ENCOUNTER — Encounter (HOSPITAL_COMMUNITY)
Admission: RE | Admit: 2015-10-26 | Discharge: 2015-10-26 | Disposition: A | Discharge status: Home / Self Care | Payer: Self-pay | Source: Ambulatory Visit | Attending: Cardiology | Admitting: Cardiology

## 2015-10-27 ENCOUNTER — Ambulatory Visit: Payer: Commercial Managed Care - PPO | Admitting: Pulmonary Disease

## 2015-10-28 ENCOUNTER — Encounter (HOSPITAL_COMMUNITY)
Admission: RE | Admit: 2015-10-28 | Discharge: 2015-10-28 | Disposition: A | Discharge status: Home / Self Care | Payer: Self-pay | Source: Ambulatory Visit | Attending: Cardiology | Admitting: Cardiology

## 2015-10-31 ENCOUNTER — Encounter (HOSPITAL_COMMUNITY): Payer: Self-pay

## 2015-10-31 ENCOUNTER — Telehealth (HOSPITAL_COMMUNITY): Payer: Self-pay | Admitting: *Deleted

## 2015-10-31 NOTE — Telephone Encounter (Signed)
-----   Message from Laurey Morale, MD sent at 10/28/2015  5:34 PM EDT ----- Regarding: RE: High Intensity Interval Training(HITT) That would be fine ----- Message -----    From: Artist Pais    Sent: 10/28/2015   7:02 AM      To: Laurey Morale, MD Subject: High Intensity Interval Training(HITT)         Greetings Dr. Shirlee Latch,  Your patient Meredith Diaz is interested in doing high intensity interval training (HIIT) in Cardiac Rehab.  They have been in the cardiac maintenance program for 5 month after completing 12 weeks in the Phase 2 program and have been doing great.  We would like to change their exercise prescription to include HIIT.  Their current THR is 93-149 (50-80%) and we would like to increase it to 175 max (95 %) for HIIT.  Their RPE levels for the HIIT would reach up to 16-17 and active rest would be 11-13.  They would start at 2 min of active rest and 30 sec of high intensity and progress as tolerated. If you are agreeable to this change in exercise prescription please let us know.   Thanks so much for your help!  Artist Pais, MS, ACSM CCEP

## 2015-11-01 ENCOUNTER — Encounter (HOSPITAL_COMMUNITY)
Admission: RE | Admit: 2015-11-01 | Discharge: 2015-11-01 | Disposition: A | Discharge status: Home / Self Care | Payer: Self-pay | Source: Ambulatory Visit | Attending: Cardiology | Admitting: Cardiology

## 2015-11-02 ENCOUNTER — Encounter (HOSPITAL_COMMUNITY)
Admission: RE | Admit: 2015-11-02 | Discharge: 2015-11-02 | Disposition: A | Discharge status: Home / Self Care | Payer: Self-pay | Source: Ambulatory Visit | Attending: Cardiology | Admitting: Cardiology

## 2015-11-04 ENCOUNTER — Encounter (HOSPITAL_COMMUNITY)
Admission: RE | Admit: 2015-11-04 | Discharge: 2015-11-04 | Disposition: A | Discharge status: Home / Self Care | Payer: Self-pay | Source: Ambulatory Visit | Attending: Cardiology | Admitting: Cardiology

## 2015-11-09 ENCOUNTER — Encounter (HOSPITAL_COMMUNITY)
Admission: RE | Admit: 2015-11-09 | Discharge: 2015-11-09 | Disposition: A | Discharge status: Home / Self Care | Payer: Self-pay | Source: Ambulatory Visit | Attending: Cardiology | Admitting: Cardiology

## 2015-11-09 DIAGNOSIS — I5042 Chronic combined systolic (congestive) and diastolic (congestive) heart failure: Secondary | ICD-10-CM | POA: Insufficient documentation

## 2015-11-10 ENCOUNTER — Ambulatory Visit (INDEPENDENT_AMBULATORY_CARE_PROVIDER_SITE_OTHER): Payer: Commercial Managed Care - PPO

## 2015-11-10 DIAGNOSIS — E538 Deficiency of other specified B group vitamins: Secondary | ICD-10-CM

## 2015-11-10 MED ORDER — CYANOCOBALAMIN 1000 MCG/ML IJ SOLN
1000.0000 ug | Freq: Once | INTRAMUSCULAR | Status: AC
  Administered 2015-11-10: 1000 ug via INTRAMUSCULAR

## 2015-11-11 ENCOUNTER — Encounter (HOSPITAL_COMMUNITY)
Admission: RE | Admit: 2015-11-11 | Discharge: 2015-11-11 | Disposition: A | Discharge status: Home / Self Care | Payer: Self-pay | Source: Ambulatory Visit | Attending: Cardiology | Admitting: Cardiology

## 2015-11-14 ENCOUNTER — Encounter (HOSPITAL_COMMUNITY)
Admission: RE | Admit: 2015-11-14 | Discharge: 2015-11-14 | Disposition: A | Discharge status: Home / Self Care | Payer: Self-pay | Source: Ambulatory Visit | Attending: Cardiology | Admitting: Cardiology

## 2015-11-15 ENCOUNTER — Telehealth: Payer: Self-pay | Admitting: Pulmonary Disease

## 2015-11-15 ENCOUNTER — Encounter: Payer: Self-pay | Admitting: Pulmonary Disease

## 2015-11-15 ENCOUNTER — Ambulatory Visit (INDEPENDENT_AMBULATORY_CARE_PROVIDER_SITE_OTHER): Payer: Commercial Managed Care - PPO | Admitting: Pulmonary Disease

## 2015-11-15 ENCOUNTER — Other Ambulatory Visit (INDEPENDENT_AMBULATORY_CARE_PROVIDER_SITE_OTHER): Payer: Commercial Managed Care - PPO

## 2015-11-15 VITALS — BP 128/80 | HR 93 | Temp 98.3°F | Ht 60.0 in | Wt 186.2 lb

## 2015-11-15 DIAGNOSIS — M4120 Other idiopathic scoliosis, site unspecified: Secondary | ICD-10-CM | POA: Diagnosis not present

## 2015-11-15 DIAGNOSIS — J984 Other disorders of lung: Secondary | ICD-10-CM

## 2015-11-15 DIAGNOSIS — I5042 Chronic combined systolic (congestive) and diastolic (congestive) heart failure: Secondary | ICD-10-CM

## 2015-11-15 DIAGNOSIS — I428 Other cardiomyopathies: Secondary | ICD-10-CM

## 2015-11-15 DIAGNOSIS — J4531 Mild persistent asthma with (acute) exacerbation: Secondary | ICD-10-CM | POA: Diagnosis not present

## 2015-11-15 DIAGNOSIS — G4733 Obstructive sleep apnea (adult) (pediatric): Secondary | ICD-10-CM

## 2015-11-15 DIAGNOSIS — J4530 Mild persistent asthma, uncomplicated: Secondary | ICD-10-CM

## 2015-11-15 DIAGNOSIS — E669 Obesity, unspecified: Secondary | ICD-10-CM

## 2015-11-15 DIAGNOSIS — Z9989 Dependence on other enabling machines and devices: Secondary | ICD-10-CM

## 2015-11-15 DIAGNOSIS — I429 Cardiomyopathy, unspecified: Secondary | ICD-10-CM

## 2015-11-15 LAB — CBC WITH DIFFERENTIAL/PLATELET
BASOS ABS: 0.1 10*3/uL (ref 0.0–0.1)
Basophils Relative: 0.5 % (ref 0.0–3.0)
EOS ABS: 0.2 10*3/uL (ref 0.0–0.7)
Eosinophils Relative: 0.9 % (ref 0.0–5.0)
HEMATOCRIT: 38 % (ref 36.0–46.0)
Hemoglobin: 12.9 g/dL (ref 12.0–15.0)
LYMPHS PCT: 21.4 % (ref 12.0–46.0)
Lymphs Abs: 4.1 10*3/uL — ABNORMAL HIGH (ref 0.7–4.0)
MCHC: 34 g/dL (ref 30.0–36.0)
MCV: 87.2 fl (ref 78.0–100.0)
MONOS PCT: 6.4 % (ref 3.0–12.0)
Monocytes Absolute: 1.2 10*3/uL — ABNORMAL HIGH (ref 0.1–1.0)
NEUTROS PCT: 70.8 % (ref 43.0–77.0)
Neutro Abs: 13.6 10*3/uL — ABNORMAL HIGH (ref 1.4–7.7)
Platelets: 498 10*3/uL — ABNORMAL HIGH (ref 150.0–400.0)
RBC: 4.35 Mil/uL (ref 3.87–5.11)
RDW: 13 % (ref 11.5–15.5)
WBC: 19.1 10*3/uL (ref 4.0–10.5)

## 2015-11-15 MED ORDER — TIOTROPIUM BROMIDE MONOHYDRATE 2.5 MCG/ACT IN AERS: 2.0000 | INHALATION_SPRAY | Freq: Every day | RESPIRATORY_TRACT | Status: DC

## 2015-11-15 NOTE — Telephone Encounter (Signed)
Received Critical lab results from [facility] in the lab downstairs White count is 19.1 Informed her that I would make SN aware. She voiced understanding and had no further questions.  SN please advise

## 2015-11-15 NOTE — Progress Notes (Signed)
Subjective:     Patient ID: Meredith Diaz, female   DOB: [birth year], 35 y.o.   MRN: [id number]  HPI 35 y/o WF, referred by DrCrawford for a pulmonary evaluation due to a hx of Asthma and increased DOE;  She has a complex hx of congenital heart dis and a cardiomyopathy w/ combined sys&diast CHF; hx anorexia as a teen=> morbid obesity as a young adult; & scoliosis/ chest wall deformity w/ pulmonary restrictive disease...   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, Programmer, multimedia for [employer], pt of DrKollar & referred for pulmonary evaluation w/ hx asthma- she notes increased DOE & no relief from her inhalers> Iretha is a never smoker & was diagnosed w/ asthma first in 72 at age 71 or so when [facility] for pneumonia (Cariology said she was hosp for CHF w/ EF=25-30% and was in hosp for 32mo) & she's had trouble ever since then, disch on NEBS;  Treated over the yrs w/ MDIs, Symbicort/Advair (the latter didn't help), last had Pred before 2013 hosp;  She was allergy tested in middle school & essentially neg x mild grass reaction by her memory;  She gets "sinus infections" a couple of times each yr (she thinks this is her main trigger) & uses Claritin/ Flonase prn;  Symptoms included SOB/ DOE w/ min activ, feels like she can't get a deep breath/ can't get enough air "in"/ not satisfied breathing (states this sensation is present all the time/constant, & she notes "gasping"); states she notes some wheezing when supine at night & some voice issues (?VCD, tight burning in throat) but denies reflux/ dysphagia/ nocturnal cough or choking (note- she has IBS & s/pGB 2013)... She also c/o sleep issues> can't sleep well while lying down, husb c/o her "wheezing"/ some snoring (but does not indicate that she stops breathing)/ restless sleeper/ leg jerks; she does have some daytime sleepiness issues w/ incr fatigue at work and she'll take naps on weekends; ESS=12... Current Pulm Meds> Symbicort160-  2puffs daily, AlbutHFA for prn use...       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 Cumulative data from cardiac evals as follows, she was told her cardiac issues are ?congenital, ?virus damaged her heart>> she has never had cardiac rehab etc...   CXR 2013 at [facility] showed cardiomegaly, biapical pleural thickening, otherw clear & NAD...  Cardiac MRI from [facility] in 2013 showed EF=52% w/ mild global HK, min AI, RV- wnl, Ao- wnl, mild pectus excavqatum w/ deviation of cardiac apex...  Cath was done in 2013 but I cannot find that report (said no signif CAD)...  EKG 11/15 showed STachy, rate 103, ?LAE, suggestive LVH, NSSTTWA...   2DEcho 11/15 by DrHarding showed mild LVH, ?poss apical variant hypertrophic cardiomyopathy, EF=40-45% w/ diffuse HK, Gr1DD, MV- mildly thickened leaflets and trivMR, norm LA size...  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 Notes hx anorexia/ depression when in high school w/ lowest wt=80 lbs; psyche rx & counseling resulted in wt gain & she was ~150 lbs in college; gained to  180# after college & lost wt to 165 prior to her marriage at age 62; now she is 35 y/o & has gained to max 203#... She is allergic to ACE inhibitors w/ prev angioedema...  EXAM reveals Afeb, VSS, O2sat=93% on RA at rest;  HEENT- neg, mallampati 2, voice sl hoarse;  Chest- decr BS bilat, clear, decr diaph excursions by percussion, freq sign breaths;  Heart- RRR w/p m/r/g;  Ext- no c/c/e...  Recent LABS> Chems- wnl;  CBC- ok w/ Hg=12.4, WBC=14K;  TSH=2.62;  B12 level= 180-250 (on  oral B12- 1058mcg/d)...  CXR 08/16/14 showed norm heart size, clear lungs, NAD...  Spirometry 08/16/14> poor effort (breathing "high" in her lung volumes)> FVC=0.84 (27%), FEV1=0.66 (24%), %1sec=78, mid-flows reduced at 18% predicted; Test indicated severe restriction w/ small airways dis...   Ambulatory oxygen saturations> O2sat=95% on RA at rest w/ pulse=89/min;  She walked 2 laps and stopped due to SOB- O2sat=83% w/ pulse 121/min...  FENO= 10ppb (results <25ppd implies absence of eosinophilic airway inflammation).  Epworth Sleepiness Score= 12 (indicating that she may be excessively sleepy depending on the situation... IMP/PLAN>>  Meredith Diaz has been diagnosed w/ asthma over the last 20+yrs but her current symptoms and spirometry looks more restricted- prob due to "chest wall factors" as we discussed;  She has additional issues from obesity- r/o OSA, and from her Cardiac disease- hx congenital HD w/ surg, HBP, cardiomyopathy w/ combined sys/diast CHF- followed & treated by [facility], DrHarding... We discussed the nature of her dyspnea and the need for additional testing and a trial of KLONOPIN 0.5mg  Bid... We will sched FullPFTs and a Sleep Study and have her return after that...   ~  August 26, 2014:  2wk ROV & Meredith Diaz returns having completed her Full PFTs and Sleep study> she has in addition been on the Klonopin 0.5mg  Bid but notes no improvement in her breathing on this dose (tol well just no better)...  Full PFTs 08/25/14 showed FVC=0.87 (26%), FEV1=0.77(27%), %1sec=88, mid-flows reduced at 28% predicted; after bronchodil- the FEV1 improved 6%; Lung volumes reduced w/ TLC=1.88 (42%), RV=0.85 (66%), RV/TLC=45 (some air trapping); DLCO=25% predicted, but corrects to normal (102%) when alveolar ventilation is taken into account...  Sleep Polysomnogram 08/18/14> Mod OSA w/ AHI=20/hr and REM AHI=54, mod snoring, desaturation to 64%...  IMP/PLANS>>  PFTs severely restricted (?effort/ accuracy) & seems to be  mostly related to obesity/ chest wall and not to underlying lung parenchymal factors (CXR clear, exam clear, we will consider hi-res CTChest later if needed); In this regard we will increase the KLONOPIN to 1mg Bid before giving up on this med; she understands how important wt reduction is to her overall improvement- may want to consider eating disorder counseling again... Her Sleep study reveals mod OSA & nocturnal hypoxemia- I discussed w/ DrClance & we decided on CPAP set up> RESMED S10 machine- air/auto w/ heated humidity & climate controlled tubing, set on AUTO 5-15, enroll in AirView, mask interface of choice per DME company & pt preference... We will plan ROV recheck in 6 weeks to review compliance & efficacy, then consider doing an ONO...    ~  October 14, 2014:  6wk ROV & Meredith Diaz has lost 2# in the interval but not able to exercise- walks some, gets winded, stairs are difficult but able to negotiate one flight; we reviewed how critically important wt reduction is for her;  She still appears sl hoarse to me, scratchy voice which she believes is due to "allergy issues" => rec ENT  evaluation for completeness;  She increased the Klonopin to 1mg Bid & this is helping some she says- not as SOB at rest, able to get a deeper breath she thinks, etc;  Using CPAP satis w/ good compliance & download data showing AHI<1/hr on CPAP; she has nasal pillows but wants full face mask & we will let [employer] know... We reviewed the following medical problems>>     DYSPNEA- multifactorial> ?asthma is a small component w/ restrictive dis due to obesity & scoliosis as a major factor, and CHF, deconditioning, anxiety as additional factors...    OSA>  Mod OSA on sleep study 4/16 w/ AHI=20/hr and REM AHI=54, mod snoring, desaturation to 64%; started on CPAP via [employer], auto 5-15 w/ good compliance => AHI <1/hr on the CPAP autoset 5-15, she wants to change nasal pillows to full face mask, we will check w/ [employer], then do ONO...    Hx  asthma> on Symbicort160- 2sp daily, VentolinHFA rescue prn- using 2-3x/wk; PFT 4/16 w/ severe restriction, + small airways dis & min response to bronchodil; asked to continue inhalers for now...    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    Hx HBP> on MetopER25, Apres25- 1.5 tabsTid, Lasix40- 1.5tabsQam, Zaroxyln2.5 prn swelling- taking it almost every day; K=4.2 5/16 at [facility]; Note> allergic to ACE w/ angioedema of lips in past; BP= 130/88 today w/o CP etc...    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    Eating disorder/ Morbid Obesity> unusual hx of eating disorder w/ ANOREXIA as teen=> OBESITY w/ wt=201#, 5' Tall, BMI=39-40; she notes hx anorexia/ depression when in high school w/ lowest wt=80 lbs; psyche rx & counseling resulted in wt gain & she was ~150 lbs in college; gained to 180# after college & lost wt to 165 prior to her marriage at age 8 (she felt better when wt was down); now she is 35 y/o & has gained to max 203#...    Hypothyroidism> on Synthroid50; Labs 12/15 showed TSH=2.62, FreeT4=0.87    Hx IBS & s/p GB in 2013> aware...    Scoliosis & back pain> she was prev evaluated at [facility] by a back specialist, she does not recall the details, no records available, she notes legs get tired, she is not falling, notes that her hands shake on occas... This is contrib to her pulmonary restriction and dyspnea => need back films then consider  referral the [facility] spine center for their review & recommendations...    VitB12 defic> Labs showed B12 blood level 179-248 and she is rec to take B12 orally Qd w/ recheck levels...     Anxiety/ depression> on Klonopin 1mg  Bid at present... EXAM reveals Afeb, VSS, O2sat=94% on RA at rest; HEENT- neg, mallampati 2, voice sl hoarse;  Chest- decr BS bilat, clear, decr diaph excursions by percussion, occas sign breaths;  Heart- RRR w/p m/r/g;  Ext- no c/c/e.  LABS 5/16 at [facility] showed Chems- wnl w/ Cr=1.04, BS=83, A1c=5.8;  FLP- at goals on diet alone...  [employer] AirView download> 5/11 - 10/13/14 showed good compliance w/ AHI <1/hr on the CPAP autoset 5-15... PLAN>>  She has a very complex medical situation as outlined above, and we are hampered by lack of old records (I have asked her to help in getting this data);  For now she will continue the Symbicort, Ventolin, Klonopin, & CPAP; we will have [employer] look into full face mask per her preference; then proceed w/ ONO;  She knows the importance of wt reduction & may want to consider counseling from Dr. Stephania Fragmin for eating disorder;  Continue cardiac meds and f/u w/ DrHarding;  She is only 35 y/o w/ signif pulm restriction & heart disease;  We will XRay her spine in anticipation of getting old records from [place] & poss referral if this is playing a signif roll as well...   ~  December 14, 2014:  68mo ROV & Meredith Diaz returns for follow up> stable on her current regimen; she has multifactorial dyspnea>     OSA>  Mod OSA on sleep study 4/16 w/ AHI=20/hr and REM AHI=54, mod snoring, desaturation to 64%; started on CPAP via [employer], auto 5-15 w/ good compliance => AHI <1/hr on the CPAP autoset 5-15, she changed nasal pillows to full face mask, we will check download on FM, then do ONO...    Hx asthma> on Symbicort160- 2sp daily, VentolinHFA rescue prn- using 2-3x/wk; PFT 4/16 w/ severe restriction, + small airways dis & min response to bronchodil; asked to continue  inhalers for now...    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    Hx HBP> on MetopER25, Apres25- 1.5 tabsTid, Lasix40- 1.5tabsQam, Zaroxyln2.5 prn swelling- taking it almost every day; K=4.2 5/16 at [facility]; Note> allergic to ACE w/ angioedema of lips in past; BP= 130/88 today w/o CP etc...    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    Eating disorder/ Morbid Obesity> unusual hx of eating disorder w/ ANOREXIA as teen=> OBESITY w/ wt=204#, 5' Tall, BMI=39-40; she notes hx anorexia/ depression when in high school w/ lowest wt=80 lbs; psyche rx & counseling resulted in wt gain & she was ~150 lbs in college; gained to 180# after college & lost wt to 165 prior to her marriage at age 68 (she felt better when wt was down); now she is 35 y/o & has gained to max 204#... EXAM reveals Afeb, VSS, O2sat=95% on RA at rest; HEENT- neg, mallampati 2, voice sl hoarse;  Chest- decr BS bilat, clear, decr diaph excursions by percussion, occas sign breaths;  Heart- RRR w/p m/r/g;  Ext- no c/c/e. PLAN>>  Meredith Diaz will soon start cardiac rehab w/ it's exercise & nutritional counseling- it is imperitive that she get the weight down!!!  We will check CPAP download on full face mask & then ONO on the CPAP set up to be sure that she is not desaturating...  ADDENDUM>>  CPAP download 8/10 - 01/13/15 on autoset  5-15 and now w/ FULL FaceMask showed 90% compliance, ave 5-6H per night,  AHI=0.6.Marland KitchenMarland Kitchen Continue same. ADDENDUM>>  ONO done 01/13/15 on CPAP on RA showed O2sat=88% for 25.49min of the 7H study; lowest O2sat=63%; oxygen desat index=51 desat events per hr; she qualifies for O2 bleed-in at 2L/min for her CPAP unit...   ~  March 17, 2015:  43mo ROV & Meredith Diaz is in Cardiac Rehab per DrHarding, sl improved & planning to continue in maintenance program;  She notes that her breathing is "ok" except w/ weather changes, she's been using the O2 bled into her CPAP Qhs; states she awakes at 3-4AM & can't get back to sleep- she remains on Klonopin 1mg  Bid, she tried TylenoloPM w/o help, tried her Lorazepam w/o help, we discussed trial of DESYREL25-50Qhs...     She was [facility] 8/31 - 01/06/15 by [facility] w/ CP> known nonischemic cardiomyop w/ EF=40-45%, stress test done by [facility] 12/06/14 was neg for ischemia but showed poor exercise tol & hypertensive response; she had CTAngio in ER- neg for PE, CP resolved w/ MS; serial EKG & enz were neg- they added Aldactone and disch for [facility] f/u...  She saw DrHarding 01/27/15 => meds adjusted & his note is reviewed, they plan rov in Dec...     She continues to f/u w/ DrKollar who started Cymbalta for depression- slight improved... EXAM reveals Afeb, VSS, O2sat=97% on RA at rest; HEENT- neg, mallampati 2, voice sl hoarse;  Chest- decr BS bilat, decr chest wall movement and decr diaph excursions by percussion, occas sign breaths, clear;  Heart- RRR w/p m/r/g;  Ext- no c/c/e.  CXR 01/05/15 showed ?left pleural thickening, r/o effusion, no focal airsp dis, scoliosis...  CT Angio Chest 01/05/15 showed mild cardiomeg, no evid of PE, ectasia of Ao measuring 3.7cm, no adenopathy, no consolidation or effusion, min Atx at left base, fusion of several vertebrae, fusion of several post ribs bilat...  LABS 12/2014> K was low at 3.0=> supplemented to norm;  BS in the 120-130 range;  Cr in the 1.1-1.2 range;  Troponins=neg;  BNP=19;  CBC- wnl... IMP/PLAN>>  She has severe pulmonary  restriction- obesity, scoliosis, ankylosis;  Weight is down to 187# on diet & [facility] exercises;  rec to continue current meds and we will try Desyrel 25-50 Qhs for sleep...   ~  June 20, 2015:  16mo ROV & last visit we continued same treatment regimen (encouraged diet, exercise, wt reduction) & added Desyrel25=>50Qhs to help her rest thru the night; Meredith Diaz reports that this was NOT working, still wakes at [employer] feels exhausted all the time, plus it caused her to ?rage & ?scream she says;  She continues in [facility] rehab maintenance program & she endorses better stamina when walking but still feels SOB "can't get enough air" despite the Klonopin 1mg  Bid; while being monitored during exercise she says her O2sats drop & I've asked her to check this formally & keep record to see if her ambulatory O2 needs modification (today in office her lowest O2sat=90% w/ exercise);  She tells me that DrCrawford (seen 2/17 c/o hair falling out, aching all over, exhausted, panic attacks) has referred her to [facility] for scoliosis & HEME due to CBC w/ WBC=13-17K w/ prev norm diff; I suggested to the pt that her PCP may consider referral to Rheum for poss FM/CFS eval...     Hx asthma> on Symbicort160- 2sp daily, VentolinHFA rescue prn- using 1-2x/wk; PFT 4/16 w/ severe restriction, + small airways dis &  min response to bronchodil; asked to continue inhalers for now...    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    Hx HBP> on MetopER50, Diovan40Bid, Demadex20, Aldactone25, K20Bid, off Apres/ off Zarox; lab 2/17 w/ K=4.7 (norm renal & BS); Note> allergic to ACE w/ angioedema of lips in past; BP= 102/70 today w/o CP etc...    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   - Cardiac MRI (2013) with EF 52%, no delayed enhancement.   - LHC in 2013 without significant CAD.  - 11/15 echo with EF 40-45%.   - 7/16 echo with EF 40-45%, grade II diastolic dysfunction (Last 2DEcho 7/16 showed EF=40-45% w/ diffuse HK, Gr2DD, norm valves, norm LA size; PA & RV are wnl)  - ETT (8/16) with poor exercise tolerance but no ischemia.   - CPX (12/16): peak VO2 15.7 (23.5 adjusted for ideal body weight), VE/VCO2 slope 21, RER 1.15 => moderate functional limitation thought to be primarily due to restrictive lung physiology.     Eating disorder/ Morbid Obesity> unusual hx of eating disorder w/ ANOREXIA as teen=> OBESITY w/ wt=204#, 5' Tall, BMI=39-40; she notes hx anorexia/ depression when in high school w/ lowest wt=80 lbs; psyche rx & counseling resulted in wt gain & she was ~150 lbs in college; gained to 180# after college & lost wt to 165 prior to her marriage at age 5 (she felt better when wt was down); now she is 35 y/o & has gained to max 204#...    Hypothyroidism> on Synthroid88; Labs 2/17 showed TSH=2.19...    Hx IBS & s/p GB in 2013> aware...    Scoliosis & back pain> she was prev evaluated at [facility] by a back specialist, she does not recall the details, no records available, she notes legs get tired, she is not falling, notes that her hands shake on occas... This is contrib to her pulmonary restriction and dyspnea => need back films then consider referral the [facility] spine center for their review & recommendations...    VitB12 defic> Labs showed B12 blood level 179-248 and she is rec to take B12 orally Qd w/ recheck levels; Labs 2/17 showed B12=313    Anxiety/ depression> on Klonopin 1mg  Bid at present & uses Ativan1mg  prn... EXAM reveals Afeb, VSS, O2sat=98% on RA at rest; 5'  Tall, wt=188#, BMI=36-37;  HEENT- neg, mallampati 2, voice sl hoarse;  Chest- decr BS bilat, decr chest wall movement and decr diaph excursions by percussion, occas sign breaths, clear;  Heart- RRR w/p m/r/g;  Ext- no c/c/e.  Cardio-Pulm exercise test 04/13/15 by DrMcLean>  Severe restrictive dis w/ decr in MVV; O2 desat to 87% w/ peak exercise...   Ambulatory Oximetry 06/20/15> O2sat=96% on RA at rest w/ pulse=89/min; she ambulated 3 laps on RA w/ lowest O2sat=90% w/ pulse=121/min...  CPAP download from 05/21/15=>06/19/15 showed 28/30d >4H, averaging 5-6H per night, Autoset5-15 w/ pressures 6-10cm & AHI=0;  rec to continue same... IMP/PLAN>>  She will continue same meds, continue her diet & exercise at [facility] & keep  record of O2sats there; we refilled her Ativan to use prn for her panic attacks which are undoubtedly playing a roll in her dyspnea events...   ~  November 15, 2015:  70mo ROV & Meredith Diaz continues in the Cardiac Rehab maintenance program where she has plateaued but still working hard; Approx 36mo ago she fell w/ pain in back & knee, she saw DrZSmith & benefitted from brief course of Pred;  She reports incr SOB, chest feels tight, some wheezing noted esp in hot humid weather;  On Symbicort160-2spBid, AlbutHFA prn, she notes that [employer] helps more, notes Singulair didn't help in the past; we decided to add INCRUSE one puff daily & reminded to use her IS regularly for the restrictive lung component... See prob list above...    EXAM reveals Afeb, VSS, O2sat=98% on RA at rest; 5' Tall, wt=186#, BMI=36;  HEENT- neg, mallampati 2, voice sl hoarse;  Chest- decr BS bilat, decr chest wall movement and decr diaph excursions by percussion, occas sign breaths, clear;  Heart- RRR w/p m/r/g;  Ext- no c/c/e.  CPAP download 6/10 - 11/13/15>>  CPAP used 30/30 days, ave ~6H per night, Autoset 5-15, ave pressure= 11-12, working very well w/ AHI=0.1, small leak...  LABS 11/15/15>  CBC- Hg=12.9, mcv=87, wbc=19.1, eos=1%;   IgE=61;  RAST panel- MOD for molds, BORDERLINE for grasses/ few trees/ ragweed... IMP/PLAN>>  She is getting B12 shots from her PCP- DrCrawford;  We discussed continuing the Symbicort160-2spBid & AlbutHFA rescue inhaler prn; We are adding Incruse once daily; Reminded to use the IS frequently to expand the lungs w/ her chest wall restricted dis...    Past Medical History  Diagnosis Date    DYSPNEA> restrictive lung dis, morbid obesity... OSA> Sleep study 08/2014 w/ AHI=20, worse in REM, desat to 64%... Hx anorexia => morbid obesity...    . Chronic diastolic congestive heart failure, NYHA class 2 07/2011    Echo: EF was 25-30%; No Sig CAD on CATH; No significant finding on Cardiac MRI; follow-up Ech0 2014 --  EF of 50%  . Asthma   . Thyroid disease  >>  On Synthroid50, TSH 12/15 = 2.62   . Depression   . Anxiety   . Essential hypertension   . Stomach problems   . Hypertension   . 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 Cardiomyopathy     Unclear etiology. Last EF by echo 40-45% with global hypokinesis, mild LVH possible apical variant hypertrophic cardiomyopathy        B12 Deficiency >> on oral B12 supplement 1063mcg/d;  Labs showed B12 level 178 => 313    Past Surgical History  Procedure Laterality Date  . Cholecystectomy  2013  . Cardiac catheterization  08/2011    Normal Coronaries - LVEDP 32 mmHg  . Transthoracic echocardiogram  07/2011    Moderate concentric LVH, mildly dilated. EF 25-30% no diastolic dysfunction parameters to suggest elevated LAP  . Cardiac mri  April 2013    No suggestion of a congenital abnormality; EF ~ 52% - no regional wall motion abnormalities., no increased myocardial signal intensity. No perfusion abnormality.  . Transthoracic echocardiogram  December 2015; July 2016    a)  EF by echo 40-45%  with global hypokinesis, mild LVH possible apical variant hypertrophic cardiomyopathy;; b)  EF 40-45%. Normal wall thickness (compared  to prior reading of possible apical LVH), grade 2 DD -- with elevated LV filling pressures (however left atrium was read as normal size), normal artery function and size. No no suggestion of pulmonary hypertension    Outpatient Encounter Prescriptions as of 03/17/2015  Medication Sig  . albuterol (PROVENTIL HFA;VENTOLIN HFA) 108 (90 BASE) MCG/ACT inhaler Inhale 2 puffs into the lungs as needed for wheezing or shortness of breath.  . calcium carbonate (TUMS - DOSED IN MG ELEMENTAL CALCIUM) 500 MG chewable tablet Chew 1 tablet by mouth daily as needed for indigestion or heartburn.  . cholestyramine light (PREVALITE) 4 GM/DOSE powder Take 4 g by mouth as needed (DIARRHEA DUE TO BILE ACIDS).   . clobetasol cream (TEMOVATE) 0.05 % Apply 1 application topically as needed (for Sclerosis).   . clonazePAM (KLONOPIN) 1 MG tablet Take 1 tablet (1 mg total) by mouth 2 (two) times daily.  . cyclobenzaprine (FLEXERIL) 5 MG tablet Take 1 tablet (5 mg total) by mouth 3 (three) times daily as needed for muscle spasms.  [name] dicyclomine (BENTYL) 20 MG tablet Take 1 tablet (20 mg total) by mouth as needed for spasms. (Patient taking differently: Take 20 mg by mouth daily as needed for spasms. )  . DULoxetine (CYMBALTA) 60 MG capsule Take 1 capsule (60 mg total) by mouth daily.  [name] esomeprazole (NEXIUM) 40 MG packet Take 40 mg by mouth daily before breakfast.  . hydrALAZINE (APRESOLINE) 50 MG tablet Take 50 mg twice a day (Patient taking differently: Take 50 mg by mouth 2 (two) times daily. )  . ibuprofen (ADVIL,MOTRIN) 200 MG tablet Take 400 mg by mouth every 6 (six) hours as needed for moderate pain.  [name] levothyroxine (SYNTHROID, LEVOTHROID) 75 MCG tablet Take 1 tablet (75 mcg total) by mouth daily.  [name] LORazepam (ATIVAN) 1 MG tablet Take 1 tablet (1 mg total) by mouth 3 (three) times daily  as needed for anxiety (or dizziness).  . meclizine (ANTIVERT) 25 MG tablet Take 1 tablet (25 mg total) by mouth 3 (three) times daily as needed for dizziness.  . metolazone (ZAROXOLYN) 2.5 MG tablet Take 2.5 mg by mouth as needed (swelling).   . metoprolol (LOPRESSOR) 50 MG tablet Take 50 mg by mouth daily.   . norethindrone-ethinyl estradiol 1/35 (ALAYCEN 1/35) tablet Take 1 tablet by mouth daily.  . ondansetron (ZOFRAN) 4 MG tablet Take 1 tablet (4 mg total) by mouth every 6 (six) hours as needed for nausea or vomiting.  . potassium chloride SA (K-DUR,KLOR-CON) 20 MEQ tablet Take 20 mEq by mouth daily.  [name] spironolactone (ALDACTONE) 25 MG tablet Take 0.5 tablets (12.5 mg total) by mouth daily.  . SYMBICORT 160-4.5 MCG/ACT inhaler INHALE 2 PUFFS INTO THE LUNGS DAILY. (Patient taking differently: INHALE 2 PUFFS INTO THE LUNGS TWICE DAILY)  . torsemide (DEMADEX) 20 MG tablet Take 1 tablet (20 mg total) by mouth daily.  . vitamin B-12 (CYANOCOBALAMIN) 1000 MCG tablet Take 1 tablet (1,000 mcg total) by mouth daily.    Allergies  Allergen Reactions  . Lisinopril Swelling    Angioedema facial, mostly just lips, no trouble breathing    Current Medications, Allergies, Past Medical History, Past Surgical History, Family History, and Social History were reviewed in [employer] record.   Review of Systems             All symptoms NEG except where BOLDED >>  Constitutional:  Denies F/C/S, anorexia, unexpected weight change. HEENT:  No HA, visual changes, earache,  nasal symptoms, sore throat, hoarseness. Resp:  cough, sputum, hemoptysis; SOB, tightness, wheezing. Cardio:  CP, palpit, DOE, orthopnea, edema. GI:  Denies N/V/D/C or blood in stool; no reflux, abd pain, distention, or gas. GU:  No dysuria, freq, urgency, hematuria, or flank pain. MS:  Denies joint pain, swelling, tenderness, or decr ROM; no neck pain, back pain, etc. Neuro:  No tremors, seizures, dizziness,  syncope, weakness, numbness, gait abn. Skin:  No suspicious lesions or skin rash. Heme:  No adenopathy, bruising, bleeding. Psyche: Denies confusion, sleep disturbance, hallucinations, anxiety, depression.   Objective:   Physical Exam    Vital Signs:  Reviewed...   General:  WD, overweight, 35 y/o WF in NAD; alert & oriented; pleasant & cooperative... HEENT:  /AT; Conjunctiva- pink, Sclera- nonicteric, EOM-wnl, PERRLA, EACs-clear, TMs-wnl; NOSE-clear; THROAT-clear & wnl.  Neck:  Supple w/ fair ROM; no JVD; normal carotid impulses w/o bruits; no thyromegaly or nodules palpated; no lymphadenopathy.  Chest:  Clear to P & A; without wheezes, rales, or rhonchi heard (decr BS at bases) Heart:  Regular Rhythm; gr1/6 SEM, without rubs or gallops detected. Abdomen:  Obese, soft, nontender- no guarding or rebound; normal bowel sounds; no organomegaly or masses palpated. Ext:  Normal ROM; without deformities or arthritic changes; no varicose veins, +venous insuffic, tr edema;  Pulses intact w/o bruits. Neuro:  No focal neuro deficits, gait normal & balance OK. Derm:  No lesions noted; no rash etc. Lymph:  No cervical, supraclavicular, axillary, or inguinal adenopathy palpated.   Assessment:      DYSPNEA>> multifactorial Restrictive lung dis- felt to be due to obesity & chest wall factors and not parenchymal pulm dis => continue Klonopin 1mg  Bid... OSA w/ mod AHI=20/hr (worse w/ REM & desat to 64%) on sleep study 08/2014 => now on CPAP Auto 5-15 w/ good compliance and AHI<1, ONO showed signif hypoxemia on the CPAP therefore Oxygen added Qhs... Hx eating disorder w/ anorexia => morbid obesity and she may benefit from psyche counseling again... Hx congenital HD w/ surg 1981, HBP, nonischemic cardiomyopathy w/ combined sys/diast CHF- followed & treated by [facility], DrHarding...   PLAN>>  She has a very complex medical situation as outlined above, and we are hampered by lack of old records (I have asked  her to help in getting this data);  For now she will continue the Symbicort, Ventolin, Klonopin, & CPAP; She knows the importance of wt reduction & may want to consider counseling from Dr. Stephania Fragmin for eating disorder;  Continue cardiac meds and f/u w/ DrHarding;  She is only 35 y/o w/ signif pulm restriction & heart disease... 8/9> Meredith Diaz will soon start cardiac rehab w/ it's exercise & nutritional counseling- it is imperitive that she get the weight down!!!  We will check CPAP download on full face mask & then ONO on the CPAP set up to be sure that she is not desaturating... ROV planned 3 months 11/10> She has severe pulmonary restriction- obesity, scoliosis, ankylosis;  Weight is down to 187# on diet & [facility] exercises;  rec to continue current meds and we will try Desyrel 25=50 Qhs for sleep. 2/13>  She will continue same meds, continue her diet & exercise at [facility] & keep record of O2sats there; we refilled her Ativan to use prn for her panic attacks which are undoubtedly playing a roll in her dyspnea.     Plan:     Patient's Medications  New Prescriptions   TIOTROPIUM BROMIDE MONOHYDRATE (SPIRIVA RESPIMAT)  2.5 MCG/ACT AERS    Inhale 2 puffs into the lungs daily.  Previous Medications   CALCIUM CARBONATE (TUMS - DOSED IN MG ELEMENTAL CALCIUM) 500 MG CHEWABLE TABLET    Chew 1 tablet by mouth daily as needed for indigestion or heartburn. Reported on 06/15/2015   CHOLECALCIFEROL (VITAMIN D) 2000 UNITS CAPS    Take by mouth daily.   CHOLESTYRAMINE LIGHT (PREVALITE) 4 GM/DOSE POWDER    Take 4 g by mouth as needed (DIARRHEA DUE TO BILE ACIDS).    CLOBETASOL CREAM (TEMOVATE) 0.05 %    Apply 1 application topically as needed (for Sclerosis).    CLONAZEPAM (KLONOPIN) 1 MG TABLET    Take 1 tablet (1 mg total) by mouth 2 (two) times daily.   DICYCLOMINE (BENTYL) 20 MG TABLET    Take 1 tablet (20 mg total) by mouth as needed for spasms.   DULOXETINE (CYMBALTA) 60 MG CAPSULE    TAKE 1 CAPSULE (60 MG  TOTAL) BY MOUTH DAILY.   GABAPENTIN (NEURONTIN) 100 MG CAPSULE    Take 2 capsules (200 mg total) by mouth at bedtime.   IBUPROFEN (ADVIL,MOTRIN) 200 MG TABLET    Take 400 mg by mouth every 6 (six) hours as needed for moderate pain.   LEVOTHYROXINE (SYNTHROID, LEVOTHROID) 100 MCG TABLET    Take 1 tablet (100 mcg total) by mouth daily.   LORAZEPAM (ATIVAN) 1 MG TABLET    Take 0.5-1 tablets (0.5-1 mg total) by mouth 3 (three) times daily as needed for anxiety.   MECLIZINE (ANTIVERT) 25 MG TABLET    Take 25 mg by mouth 3 (three) times daily as needed for dizziness.   MELOXICAM (MOBIC) 15 MG TABLET    Take 15 mg by mouth daily as needed for pain.   METOPROLOL SUCCINATE (TOPROL-XL) 50 MG 24 HR TABLET    Take 1 tablet (50 mg total) by mouth daily. Take with or immediately following a meal.   NORETHINDRONE-ETHINYL ESTRADIOL 1/35 (ALAYCEN 1/35) TABLET    Take 1 tablet by mouth daily.   POTASSIUM CHLORIDE SA (K-DUR,KLOR-CON) 20 MEQ TABLET    Take 2 tablets (40 mEq total) by mouth daily.   SPIRONOLACTONE (ALDACTONE) 25 MG TABLET    Take 1 tablet (25 mg total) by mouth daily.   SYMBICORT 160-4.5 MCG/ACT INHALER    INHALE 2 PUFFS INTO THE LUNGS DAILY.   TORSEMIDE (DEMADEX) 20 MG TABLET    Take 1 tablet (20 mg total) by mouth daily.   TRAZODONE (DESYREL) 50 MG TABLET    Take 1/2-1 tablet by mouth in evening as needed for sleep   VALSARTAN (DIOVAN) 40 MG TABLET    TAKE 1 TABLET (40 MG TOTAL) BY MOUTH 2 (TWO) TIMES DAILY.   VENTOLIN HFA 108 (90 BASE) MCG/ACT INHALER    INHALE 2 PUFFS INTO THE LUNGS AS NEEDED FOR WHEEZING OR SHORTNESS OF BREATH.  Modified Medications   No medications on file  Discontinued Medications   PREDNISONE (DELTASONE) 50 MG TABLET    Take 1 tablet (50 mg total) by mouth daily.   TORSEMIDE (DEMADEX) 20 MG TABLET    Take 1 tablet (20 mg total) by mouth daily.

## 2015-11-15 NOTE — Patient Instructions (Signed)
Today we updated your med list in our EPIC system...    Continue your current medications the same including the [facility] & your rescue inhaler...  We decided to add an additional ASTHMA medication, an anticholinergic>     Use the Spiriva Respimat- 2 inhalations once daily...  Today we also checked some asthma related blood work to look at your eosinophils and IgE level (w/ RAST test)    We will contact you w/ the results when available...   Finally- use the new INCENTIVE SPIROMETER to aid in lung expansion--    You may do 2-3 good deep breaths every hour or so while awake, etc as we discussed...  Call for any questions...  Let's plan a follow up visit in 3-7mo, sooner if needed for problems.Marland KitchenMarland Kitchen

## 2015-11-16 ENCOUNTER — Encounter (HOSPITAL_COMMUNITY)
Admission: RE | Admit: 2015-11-16 | Discharge: 2015-11-16 | Disposition: A | Discharge status: Home / Self Care | Payer: Self-pay | Source: Ambulatory Visit | Attending: Cardiology | Admitting: Cardiology

## 2015-11-16 LAB — RESPIRATORY ALLERGY PROFILE REGION II ~~LOC~~
ALLERGEN, D PTERNOYSSINUS, D1: 1.06 kU/L — AB
ALLERGEN, OAK, T7: 0.13 kU/L — AB
Allergen, Cedar tree, t12: <0.1 kU/L
Allergen, Cottonwood, t14: <0.1 kU/L
Allergen, Mouse Urine Protein, e78: <0.1 kU/L
Allergen, Mulberry, t76: <0.1 kU/L
Alternaria Alternata: <0.1 kU/L
Aspergillus fumigatus, m3: <0.1 kU/L
BOX ELDER: 0.15 kU/L — AB
Bermuda Grass: 0.2 kU/L — ABNORMAL HIGH
Cladosporium Herbarum: <0.1 kU/L
Cockroach: <0.1 kU/L
Common Ragweed: 0.16 kU/L — ABNORMAL HIGH
D. farinae: 0.8 kU/L — ABNORMAL HIGH
ELM IGE: 0.13 kU/L — AB
IGE (IMMUNOGLOBULIN E), SERUM: 61 kU/L (ref <115)
Johnson Grass: 0.13 kU/L — ABNORMAL HIGH
Pecan/Hickory Tree IgE: <0.1 kU/L
Penicillium Notatum: <0.1 kU/L
Rough Pigweed  IgE: <0.1 kU/L
SHEEP SORREL IGE: 0.19 kU/L — AB
Timothy Grass: 0.13 kU/L — ABNORMAL HIGH

## 2015-11-18 ENCOUNTER — Encounter (HOSPITAL_COMMUNITY): Payer: Self-pay

## 2015-11-21 ENCOUNTER — Encounter (HOSPITAL_COMMUNITY)
Admission: RE | Admit: 2015-11-21 | Discharge: 2015-11-21 | Disposition: A | Discharge status: Home / Self Care | Payer: Self-pay | Source: Ambulatory Visit | Attending: Cardiology | Admitting: Cardiology

## 2015-11-23 ENCOUNTER — Encounter (HOSPITAL_COMMUNITY)
Admission: RE | Admit: 2015-11-23 | Discharge: 2015-11-23 | Disposition: A | Discharge status: Home / Self Care | Payer: Self-pay | Source: Ambulatory Visit | Attending: Cardiology | Admitting: Cardiology

## 2015-11-25 ENCOUNTER — Encounter (HOSPITAL_COMMUNITY): Payer: Self-pay

## 2015-11-28 ENCOUNTER — Encounter (HOSPITAL_COMMUNITY): Payer: Self-pay

## 2015-11-30 ENCOUNTER — Encounter (HOSPITAL_COMMUNITY): Payer: Self-pay

## 2015-12-02 ENCOUNTER — Encounter (HOSPITAL_COMMUNITY)
Admission: RE | Admit: 2015-12-02 | Discharge: 2015-12-02 | Disposition: A | Discharge status: Home / Self Care | Payer: Self-pay | Source: Ambulatory Visit | Attending: Cardiology | Admitting: Cardiology

## 2015-12-05 ENCOUNTER — Encounter (HOSPITAL_COMMUNITY)
Admission: RE | Admit: 2015-12-05 | Discharge: 2015-12-05 | Disposition: A | Discharge status: Home / Self Care | Payer: Self-pay | Source: Ambulatory Visit | Attending: Cardiology | Admitting: Cardiology

## 2015-12-05 NOTE — Progress Notes (Signed)
[facility] [address] [place], [place] [address] Phone: [phone] Subjective:    I'm seeing this patient by the request  of:  Myrlene Broker, MD   CC: Left knee and foot pain Follow-up  [id number]  Meredith Diaz is a 35 y.o. female coming in with complaint of left knee and foot pain. 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 I believe the patient did have more of a knee sprain. Patient seems to be doing better. Patient states no longer having the swelling. Is able to move the pain completely. Has also she is careful with the movement she seems to do well. No significant instability on the sheet was significantly.   Patient also complains a left foot numbness. Patient was found to actually have more of a lumbar radiculopathy with patient's history of scoliosis. Was on gabapentin and doing 70% better.  This was 6 weeks ago.  X-rays were taken as well at last visit. X-ray showed that patient had moderate facet arthropathy from L4-S1. Very mild scoliosis noted as well. Patient states that the numbness in the toes is significantly better with the gabapentin. States in the back pain comes and goes but is not severe. Able to do daily activities. Would state that she has 75-85% better.    Past Medical History:  Diagnosis Date  . Anxiety   . Asthma   . Chronic combined systolic and diastolic CHF (congestive heart failure) (HCC) 07/2011   a. Prior Echo: EF was 25-30%; b. No Sig CAD on CATH; c. No significant finding on Cardiac MRI; d. follow-up Echo 2014 --  EF of 50%;  e. 03/2014 Echo: EF 40-45%, gr 1 DD; f. 11/2014 Echo: EF 40-45%, Gr 2 DD.  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  . Depression   . Essential hypertension   . Morbid obesity (HCC)   . Nonischemic  cardiomyopathy (HCC)    a. unclear etiology;  b. 01/2012 Cardiac MRI: no infiltrative pathology, EF 52%;  b. 11/2014 Echo: EF 40-45%, gr 2 DD.  [name] Stomach problems   . Thyroid disease    Past Surgical History:  Procedure Laterality Date  . CARDIAC CATHETERIZATION  08/2011   Normal Coronaries - LVEDP 32 mmHg  . Cardiac MRI  April 2013   No suggestion of a congenital abnormality; EF ~ 52% - no regional wall motion abnormalities., no increased myocardial signal intensity. No perfusion abnormality.  . CHOLECYSTECTOMY  2013  . TRANSTHORACIC ECHOCARDIOGRAM  07/2011   Moderate concentric LVH, mildly dilated. EF 25-30% no diastolic dysfunction parameters to suggest elevated LAP  . 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 Normal wall thickness (compared to prior reading of possible apical LVH), grade 2 DD -- with elevated LV filling pressures (however left atrium was read as normal size), normal artery function and size. No no suggestion of pulmonary hypertension   Social History   Social History  . Marital status: Married    Spouse name: N/A  . Number of children: N/A  . Years of education: N/A   Occupational History  . editor    Social History Main Topics  . Smoking status: Never Smoker  .  Smokeless tobacco: None  . Alcohol use No  . Drug use: No  . Sexual activity: Not Asked   Other Topics Concern  . None   Social History Narrative  . None   Allergies  Allergen Reactions  . Lisinopril Swelling    Angioedema facial, mostly just lips, no trouble breathing   Family History  Problem Relation Age of Onset  . Depression Mother   . Hypertension Father   . Heart disease Maternal Grandfather   . Heart disease Paternal Grandmother   . Breast cancer Paternal Grandmother   . Colon cancer Maternal Grandmother   . Asthma Paternal Grandmother   .  Heart attack Maternal Grandfather   . Heart attack Paternal Grandmother   . Heart attack Paternal Grandfather   . Stroke Neg Hx     Past medical history, social, surgical and family history all reviewed in electronic medical record.  No pertanent information unless stated regarding to the chief complaint.   Review of Systems: No headache, visual changes, nausea, vomiting, diarrhea, constipation, dizziness, abdominal pain, skin rash, fevers, chills, night sweats, weight loss, swollen lymph nodes, body aches, joint swelling, muscle aches, chest pain, shortness of breath, mood changes.   Objective  Blood pressure 114/82, pulse 89, height 5' (1.524 m), weight 191 lb (86.6 kg), SpO2 94 %.  General: No apparent distress alert and oriented x3 mood and affect normal, dressed appropriately.  HEENT: Pupils equal, extraocular movements intact  Respiratory: Patient's speak in full sentences and does not appear short of breath  Cardiovascular: No lower extremity edema, non tender, no erythema  Skin: Warm dry intact with no signs of infection or rash on extremities or on axial skeleton.  Abdomen: Soft nontender  Neuro: Cranial nerves II through XII are intact, neurovascularly intact in all extremities with 2+ DTRs and 2+ pulses.  Lymph: No lymphadenopathy of posterior or anterior cervical chain or axillae bilaterally.  Gait antalgic gait MSK:  Non tender with full range of motion and good stability and symmetric strength and tone of shoulders, elbows, wrist, hip, and ankles bilaterally.   Foot examination overpronation of the hindfoot bilaterally  Knee: Left Very mild tenderness to palpation over the medial joint line ROM full in flexion and extension and lower leg rotation. Continues to have some instability and laxity of the MCL compared to contralateral side Negative Mcmurray's, Apley's, and Thessalonian tests. Mild painful patellar compression. Patellar glide with very mild crepitus. Patellar  and quadriceps tendons unremarkable. Hamstring and quadriceps strength is normal.  Contralateral knee unremarkable   Back exam is consistent with scoliosis. Negative straight leg test but patient does have tightness of the hamstring compared to the contralateral side. No numbness noted in the foot today. Nontender on exam today still lacking the last 5 of extension and 5 of flexion..     Impression and Recommendations:     This case required medical decision making of moderate complexity.      Note: This dictation was prepared with Dragon dictation along with smaller phrase technology. Any transcriptional errors that result from this process are unintentional.

## 2015-12-06 ENCOUNTER — Ambulatory Visit (INDEPENDENT_AMBULATORY_CARE_PROVIDER_SITE_OTHER): Payer: Commercial Managed Care - PPO | Admitting: Family Medicine

## 2015-12-06 ENCOUNTER — Encounter: Payer: Self-pay | Admitting: Family Medicine

## 2015-12-06 ENCOUNTER — Encounter: Payer: Self-pay | Admitting: *Deleted

## 2015-12-06 DIAGNOSIS — M7642 Tibial collateral bursitis [Pellegrini-Stieda], left leg: Secondary | ICD-10-CM | POA: Diagnosis not present

## 2015-12-06 DIAGNOSIS — M5416 Radiculopathy, lumbar region: Secondary | ICD-10-CM | POA: Diagnosis not present

## 2015-12-06 NOTE — Assessment & Plan Note (Signed)
Patient seems to be doing relatively well. We will continue the low dose gabapentin. No significant side effects. We'll not increased with patient's comorbidities. Patient's may need advanced imaging if this continues. I do not think that this is the main concern. Could be more secondary to patient's cardiomyopathy. We will continue to monitor. Patient will continue with conservative therapy and if worsening symptoms we'll come back.

## 2015-12-06 NOTE — Patient Instructions (Addendum)
Great to see you  Meredith Diaz is your friend still  Stay active.  Remember the shoes.   I think that and the lift will help a lot. Ice when you need it Continue the low dose of gabapentin if helping.  Continue to watch the knee but I think you will do great! See me when you need me!

## 2015-12-06 NOTE — Assessment & Plan Note (Signed)
Will be a chronic problem. We discussed possible bracing which patient declined. We discussed which activities to potentially avoid. Patient will continue with the conservative therapy. Patient will follow-up with me again if any worsening symptoms or any instability occurs and then again we will discuss possible injections, home exercises, formal physical therapy and bracing.

## 2015-12-07 ENCOUNTER — Encounter (HOSPITAL_COMMUNITY): Payer: Self-pay

## 2015-12-07 DIAGNOSIS — I5042 Chronic combined systolic (congestive) and diastolic (congestive) heart failure: Secondary | ICD-10-CM | POA: Insufficient documentation

## 2015-12-08 ENCOUNTER — Encounter (HOSPITAL_COMMUNITY)
Admission: RE | Admit: 2015-12-08 | Discharge: 2015-12-08 | Disposition: A | Discharge status: Home / Self Care | Payer: Self-pay | Source: Ambulatory Visit | Attending: Cardiology | Admitting: Cardiology

## 2015-12-09 ENCOUNTER — Encounter (HOSPITAL_COMMUNITY)
Admission: RE | Admit: 2015-12-09 | Discharge: 2015-12-09 | Disposition: A | Discharge status: Home / Self Care | Payer: Self-pay | Source: Ambulatory Visit | Attending: Cardiology | Admitting: Cardiology

## 2015-12-12 ENCOUNTER — Encounter (HOSPITAL_COMMUNITY)
Admission: RE | Admit: 2015-12-12 | Discharge: 2015-12-12 | Disposition: A | Discharge status: Home / Self Care | Payer: Self-pay | Source: Ambulatory Visit | Attending: Cardiology | Admitting: Cardiology

## 2015-12-13 ENCOUNTER — Encounter: Payer: Self-pay | Admitting: Pulmonary Disease

## 2015-12-14 ENCOUNTER — Encounter (HOSPITAL_COMMUNITY)
Admission: RE | Admit: 2015-12-14 | Discharge: 2015-12-14 | Disposition: A | Discharge status: Home / Self Care | Payer: Self-pay | Source: Ambulatory Visit | Attending: Cardiology | Admitting: Cardiology

## 2015-12-16 ENCOUNTER — Encounter (HOSPITAL_COMMUNITY): Payer: Self-pay

## 2015-12-19 ENCOUNTER — Encounter (HOSPITAL_COMMUNITY)
Admission: RE | Admit: 2015-12-19 | Discharge: 2015-12-19 | Disposition: A | Discharge status: Home / Self Care | Payer: Self-pay | Source: Ambulatory Visit | Attending: Cardiology | Admitting: Cardiology

## 2015-12-21 ENCOUNTER — Encounter (HOSPITAL_COMMUNITY): Payer: Self-pay

## 2015-12-22 ENCOUNTER — Encounter (HOSPITAL_COMMUNITY)
Admission: RE | Admit: 2015-12-22 | Discharge: 2015-12-22 | Disposition: A | Discharge status: Home / Self Care | Payer: Self-pay | Source: Ambulatory Visit | Attending: Cardiology | Admitting: Cardiology

## 2015-12-23 ENCOUNTER — Encounter (HOSPITAL_COMMUNITY)
Admission: RE | Admit: 2015-12-23 | Discharge: 2015-12-23 | Disposition: A | Discharge status: Home / Self Care | Payer: Self-pay | Source: Ambulatory Visit | Attending: Cardiology | Admitting: Cardiology

## 2015-12-26 ENCOUNTER — Encounter (HOSPITAL_COMMUNITY): Payer: Self-pay

## 2015-12-28 ENCOUNTER — Encounter (HOSPITAL_COMMUNITY): Payer: Self-pay

## 2015-12-30 ENCOUNTER — Encounter (HOSPITAL_COMMUNITY)
Admission: RE | Admit: 2015-12-30 | Discharge: 2015-12-30 | Disposition: A | Discharge status: Home / Self Care | Payer: Self-pay | Source: Ambulatory Visit | Attending: Cardiology | Admitting: Cardiology

## 2016-01-02 ENCOUNTER — Other Ambulatory Visit (INDEPENDENT_AMBULATORY_CARE_PROVIDER_SITE_OTHER): Payer: Commercial Managed Care - PPO

## 2016-01-02 ENCOUNTER — Other Ambulatory Visit (HOSPITAL_COMMUNITY): Payer: Self-pay | Admitting: Cardiology

## 2016-01-02 ENCOUNTER — Encounter (HOSPITAL_COMMUNITY): Payer: Self-pay

## 2016-01-02 ENCOUNTER — Encounter: Payer: Self-pay | Admitting: Internal Medicine

## 2016-01-02 ENCOUNTER — Ambulatory Visit (INDEPENDENT_AMBULATORY_CARE_PROVIDER_SITE_OTHER): Payer: Commercial Managed Care - PPO | Admitting: Internal Medicine

## 2016-01-02 VITALS — BP 108/80 | HR 109 | Wt 189.0 lb

## 2016-01-02 DIAGNOSIS — R0600 Dyspnea, unspecified: Secondary | ICD-10-CM | POA: Diagnosis not present

## 2016-01-02 DIAGNOSIS — E538 Deficiency of other specified B group vitamins: Secondary | ICD-10-CM

## 2016-01-02 DIAGNOSIS — G4733 Obstructive sleep apnea (adult) (pediatric): Secondary | ICD-10-CM

## 2016-01-02 DIAGNOSIS — F4323 Adjustment disorder with mixed anxiety and depressed mood: Secondary | ICD-10-CM | POA: Diagnosis not present

## 2016-01-02 DIAGNOSIS — E038 Other specified hypothyroidism: Secondary | ICD-10-CM

## 2016-01-02 DIAGNOSIS — Z9989 Dependence on other enabling machines and devices: Secondary | ICD-10-CM

## 2016-01-02 LAB — COMPREHENSIVE METABOLIC PANEL
ALBUMIN: 4.3 g/dL (ref 3.5–5.2)
ALT: 12 U/L (ref 0–35)
AST: 11 U/L (ref 0–37)
Alkaline Phosphatase: 88 U/L (ref 39–117)
BUN: 15 mg/dL (ref 6–23)
CHLORIDE: 98 meq/L (ref 96–112)
CO2: 29 meq/L (ref 19–32)
CREATININE: 0.93 mg/dL (ref 0.40–1.20)
Calcium: 9.5 mg/dL (ref 8.4–10.5)
GFR: 72.55 mL/min (ref >60.00)
Glucose, Bld: 91 mg/dL (ref 70–99)
POTASSIUM: 3.9 meq/L (ref 3.5–5.1)
SODIUM: 138 meq/L (ref 135–145)
Total Bilirubin: 0.3 mg/dL (ref 0.2–1.2)
Total Protein: 8.6 g/dL — ABNORMAL HIGH (ref 6.0–8.3)

## 2016-01-02 LAB — T4, FREE: FREE T4: 0.83 ng/dL (ref 0.60–1.60)

## 2016-01-02 LAB — TSH: TSH: 4.78 u[IU]/mL — AB (ref 0.35–4.50)

## 2016-01-02 LAB — VITAMIN B12: VITAMIN B 12: 297 pg/mL (ref 211–911)

## 2016-01-02 LAB — CORTISOL: CORTISOL PLASMA: 8 ug/dL

## 2016-01-02 LAB — BRAIN NATRIURETIC PEPTIDE: Pro B Natriuretic peptide (BNP): 33 pg/mL (ref 0.0–100.0)

## 2016-01-02 NOTE — Assessment & Plan Note (Signed)
Checking B12 level today as was still low last time.

## 2016-01-02 NOTE — Progress Notes (Signed)
Pre visit review using our clinic review tool, if applicable. No additional management support is needed unless otherwise documented below in the visit note. 

## 2016-01-02 NOTE — Assessment & Plan Note (Signed)
Seeing pulmonary and this is multifactorial. She should get another sleep study as she is down 20 pounds since last one and not having as much help with the CPAP. If she is not able to stop she may need settings adjustment. She will continue exercising to work on her endurance and feelings of breathlessness.

## 2016-01-02 NOTE — Assessment & Plan Note (Signed)
Doing well with cymbalta and clonazepam and will continue. Exercising is helping and she is still struggling with having these significant health issues so early in life.

## 2016-01-02 NOTE — Assessment & Plan Note (Signed)
Checking TSH and free T4, recently dose increased to 75 mcg daily synthroid. Adjust as needed.

## 2016-01-02 NOTE — Patient Instructions (Addendum)
We are checking the labs today and will call you back with the results.   Keep up the good work with the exercise to keep your lungs doing well.

## 2016-01-02 NOTE — Progress Notes (Signed)
   Subjective:    Patient ID: Meredith Diaz, female    DOB: 05/06/1981, 35 y.o.   MRN: [id number]  HPI The patient is a 35 YO female coming in for follow up of several problems including her mood (she is stable on cymblta and clonazepam, doing exercise with cardiac rehab, no new stressors), and her energy (using CPAP but feels like it is disturbing her more than helping, and her B12 level is low and she is doing the replacement, and her thyroid medicine was just increased at last visit) and her breathing symptoms (she is still struggling and wants to get another sleep study as she has lost 20 pounds since the last one, she has restrictive lung disease and heart failure, weight is stable, diet has been poor the last several weeks with vacations etc).   Review of Systems  Constitutional: Positive for fatigue. Negative for appetite change, chills, fever and unexpected weight change.  HENT: Negative.   Eyes: Negative.   Respiratory: Positive for shortness of breath. Negative for cough, chest tightness and wheezing.   Cardiovascular: Negative for chest pain, palpitations and leg swelling.  Gastrointestinal: Negative for abdominal distention, blood in stool, constipation, diarrhea and nausea.  Musculoskeletal: Positive for arthralgias, back pain and myalgias. Negative for gait problem.  Neurological: Positive for weakness. Negative for dizziness, tremors, numbness and headaches.  Psychiatric/Behavioral: Negative for decreased concentration, self-injury and suicidal ideas. The patient is not nervous/anxious.       Objective:   Physical Exam  Constitutional: She is oriented to person, place, and time. She appears well-developed and well-nourished. No distress.  Overweight  HENT:  Head: Normocephalic and atraumatic.  Right Ear: External ear normal.  Left Ear: External ear normal.  Mouth/Throat: Oropharynx is clear and moist.  Eyes: EOM are normal.  Neck: Normal range of motion.  Cardiovascular:  Normal rate and regular rhythm.   Pulmonary/Chest: Effort normal and breath sounds normal. No respiratory distress. She has no wheezes. She has no rales.  Abdominal: Soft. Bowel sounds are normal. She exhibits no distension and no mass. There is no tenderness. There is no rebound and no guarding.  Neurological: She is alert and oriented to person, place, and time. Coordination normal.  Skin: Skin is warm and dry.   Vitals:   01/02/16 1301  BP: 108/80  Pulse: (!) 109  SpO2: 99%  Weight: 189 lb (85.7 kg)      Assessment & Plan:

## 2016-01-02 NOTE — Assessment & Plan Note (Signed)
May need new sleep study due to weight loss and settings may not be right anymore.

## 2016-01-03 ENCOUNTER — Other Ambulatory Visit: Payer: Self-pay | Admitting: Internal Medicine

## 2016-01-03 ENCOUNTER — Encounter (HOSPITAL_COMMUNITY)
Admission: RE | Admit: 2016-01-03 | Discharge: 2016-01-03 | Disposition: A | Discharge status: Home / Self Care | Payer: Self-pay | Source: Ambulatory Visit | Attending: Cardiology | Admitting: Cardiology

## 2016-01-03 MED ORDER — LEVOTHYROXINE SODIUM 125 MCG PO TABS: 125.0000 ug | ORAL_TABLET | Freq: Every day | ORAL | 3 refills | Status: DC

## 2016-01-04 ENCOUNTER — Encounter (HOSPITAL_COMMUNITY): Payer: Self-pay

## 2016-01-06 ENCOUNTER — Encounter (HOSPITAL_COMMUNITY)
Admission: RE | Admit: 2016-01-06 | Discharge: 2016-01-06 | Disposition: A | Discharge status: Home / Self Care | Payer: Commercial Managed Care - PPO | Source: Ambulatory Visit | Attending: Cardiology | Admitting: Cardiology

## 2016-01-06 DIAGNOSIS — I5042 Chronic combined systolic (congestive) and diastolic (congestive) heart failure: Secondary | ICD-10-CM | POA: Insufficient documentation

## 2016-01-11 ENCOUNTER — Encounter (HOSPITAL_COMMUNITY): Payer: Self-pay

## 2016-01-13 ENCOUNTER — Ambulatory Visit (INDEPENDENT_AMBULATORY_CARE_PROVIDER_SITE_OTHER): Payer: Commercial Managed Care - PPO | Admitting: Family

## 2016-01-13 ENCOUNTER — Encounter (HOSPITAL_COMMUNITY): Payer: Self-pay

## 2016-01-13 ENCOUNTER — Encounter: Payer: Self-pay | Admitting: Family

## 2016-01-13 DIAGNOSIS — J019 Acute sinusitis, unspecified: Secondary | ICD-10-CM | POA: Insufficient documentation

## 2016-01-13 DIAGNOSIS — J0100 Acute maxillary sinusitis, unspecified: Secondary | ICD-10-CM | POA: Diagnosis not present

## 2016-01-13 MED ORDER — AMOXICILLIN-POT CLAVULANATE 875-125 MG PO TABS: 1.0000 | ORAL_TABLET | Freq: Two times a day (BID) | ORAL | 0 refills | Status: DC

## 2016-01-13 MED ORDER — PREDNISONE 20 MG PO TABS: 20.0000 mg | ORAL_TABLET | Freq: Two times a day (BID) | ORAL | 0 refills | Status: DC

## 2016-01-13 MED ORDER — HYDROCOD POLST-CPM POLST ER 10-8 MG/5ML PO SUER: 5.0000 mL | Freq: Every evening | ORAL | 0 refills | Status: DC | PRN

## 2016-01-13 NOTE — Progress Notes (Signed)
Subjective:    Patient ID: Meredith Diaz, female    DOB: 02/05/1981, 35 y.o.   MRN: [id number]  Chief Complaint  Patient presents with  . Chest congestion    x2 weeks, congestion, SOB, cough, does have asthma    HPI:  Meredith Pattenllis G Fester is a 35 y.o. female who  has a past medical history of Anxiety; Asthma; Chronic combined systolic and diastolic CHF (congestive heart failure) (HCC) (07/2011); Congenital heart disease, adult; Depression; Essential hypertension; Morbid obesity (HCC); Nonischemic cardiomyopathy (HCC); Stomach problems; and Thyroid disease. and presents today for an acute office visit.   This is a new problem. Associated symptom of cough, headache, congestion and shortness of breath has been going on for about 2 weeks. Mild elevation in temperature with a max of 99.5.  Modifying factors include Netti pot, aspirin, ibuprofen and hydration which has not helped very much. She does have to use her rescue inhaler more. Course of the symptoms have progressively worsen. No recent antibiotics.   Allergies  Allergen Reactions  . Lisinopril Swelling    Angioedema facial, mostly just lips, no trouble breathing      Outpatient Medications Prior to Visit  Medication Sig Dispense Refill  . calcium carbonate (TUMS - DOSED IN MG ELEMENTAL CALCIUM) 500 MG chewable tablet Chew 1 tablet by mouth daily as needed for indigestion or heartburn. Reported on 06/15/2015    . Cholecalciferol (VITAMIN D) 2000 units CAPS Take by mouth daily.    . cholestyramine light (PREVALITE) 4 GM/DOSE powder Take 4 g by mouth as needed (DIARRHEA DUE TO BILE ACIDS).     . clobetasol cream (TEMOVATE) 0.05 % Apply 1 application topically as needed (for Sclerosis).     . clonazePAM (KLONOPIN) 1 MG tablet Take 1 tablet (1 mg total) by mouth 2 (two) times daily. 60 tablet 5  . dicyclomine (BENTYL) 20 MG tablet Take 1 tablet (20 mg total) by mouth as needed for spasms. 60 tablet 3  . DULoxetine (CYMBALTA) 60 MG capsule  TAKE 1 CAPSULE (60 MG TOTAL) BY MOUTH DAILY. 30 capsule 6  . gabapentin (NEURONTIN) 100 MG capsule Take 2 capsules (200 mg total) by mouth at bedtime. (Patient taking differently: Take 100 mg by mouth at bedtime. ) 60 capsule 3  . ibuprofen (ADVIL,MOTRIN) 200 MG tablet Take 400 mg by mouth every 6 (six) hours as needed for moderate pain.    [name]. levothyroxine (SYNTHROID, LEVOTHROID) 125 MCG tablet Take 1 tablet (125 mcg total) by mouth daily. 90 tablet 3  . LORazepam (ATIVAN) 1 MG tablet Take 0.5-1 tablets (0.5-1 mg total) by mouth 3 (three) times daily as needed for anxiety. 90 tablet 0  . meclizine (ANTIVERT) 25 MG tablet Take 25 mg by mouth 3 (three) times daily as needed for dizziness.    . meloxicam (MOBIC) 15 MG tablet Take 15 mg by mouth daily as needed for pain.    . metoprolol succinate (TOPROL-XL) 50 MG 24 hr tablet Take 1 tablet (50 mg total) by mouth daily. Take with or immediately following a meal. 90 tablet 3  . norethindrone-ethinyl estradiol 1/35 (ALAYCEN 1/35) tablet Take 1 tablet by mouth daily. 1 Package 12  . potassium chloride SA (K-DUR,KLOR-CON) 20 MEQ tablet Take 2 tablets (40 mEq total) by mouth daily. 60 tablet 6  . spironolactone (ALDACTONE) 25 MG tablet TAKE 1 TABLET EVERY DAY 30 tablet 6  . SYMBICORT 160-4.5 MCG/ACT inhaler INHALE 2 PUFFS INTO THE LUNGS DAILY. (Patient taking differently: INHALE  2 PUFFS INTO THE LUNGS TWICE DAILY) 10.2 Inhaler 9  . Tiotropium Bromide Monohydrate (SPIRIVA RESPIMAT) 2.5 MCG/ACT AERS Inhale 2 puffs into the lungs daily. 4 g 4  . torsemide (DEMADEX) 20 MG tablet Take 1 tablet (20 mg total) by mouth daily. 60 tablet 0  . traZODone (DESYREL) 50 MG tablet Take 1/2-1 tablet by mouth in evening as needed for sleep 30 tablet 5  . valsartan (DIOVAN) 40 MG tablet TAKE 1 TABLET (40 MG TOTAL) BY MOUTH 2 (TWO) TIMES DAILY. 60 tablet 6  . VENTOLIN HFA 108 (90 Base) MCG/ACT inhaler INHALE 2 PUFFS INTO THE LUNGS AS NEEDED FOR WHEEZING OR SHORTNESS OF BREATH.  18 Inhaler 2   No facility-administered medications prior to visit.       Past Surgical History:  Procedure Laterality Date  . CARDIAC CATHETERIZATION  08/2011   Normal Coronaries - LVEDP 32 mmHg  . Cardiac MRI  April 2013   No suggestion of a congenital abnormality; EF ~ 52% - no regional wall motion abnormalities., no increased myocardial signal intensity. No perfusion abnormality.  . CHOLECYSTECTOMY  2013  . TRANSTHORACIC ECHOCARDIOGRAM  07/2011   Moderate concentric LVH, mildly dilated. EF 25-30% no diastolic dysfunction parameters to suggest elevated LAP  . 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 Normal wall thickness (compared to prior reading of possible apical LVH), grade 2 DD -- with elevated LV filling pressures (however left atrium was read as normal size), normal artery function and size. No no suggestion of pulmonary hypertension      Past Medical History:  Diagnosis Date  . Anxiety   . Asthma   . Chronic combined systolic and diastolic CHF (congestive heart failure) (HCC) 07/2011   a. Prior Echo: EF was 25-30%; b. No Sig CAD on CATH; c. No significant finding on Cardiac MRI; d. follow-up Echo 2014 --  EF of 50%;  e. 03/2014 Echo: EF 40-45%, gr 1 DD; f. 11/2014 Echo: EF 40-45%, Gr 2 DD.  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  . Depression   . Essential hypertension   . Morbid obesity (HCC)   . Nonischemic cardiomyopathy (HCC)    a. unclear etiology;  b. 01/2012 Cardiac MRI: no infiltrative pathology, EF 52%;  b. 11/2014 Echo: EF 40-45%, gr 2 DD.  [name] Stomach problems   . Thyroid disease      Review of Systems  Constitutional: Negative for chills and fever.  HENT: Positive  for congestion, sinus pressure and sore throat.   Respiratory: Positive for cough and shortness of breath.   Neurological: Positive for headaches.      Objective:    BP 112/80 (BP Location: Left Arm, Patient Position: Sitting, Cuff Size: Large)   Pulse 96   Temp 98.1 F (36.7 C) (Oral)   Resp 16   Ht 5' (1.524 m)   Wt 190 lb (86.2 kg)   SpO2 95%   BMI 37.11 kg/m  Nursing note and vital signs reviewed.  Physical Exam  Constitutional: She is oriented to person, place, and time. She appears well-developed and well-nourished. No distress.  HENT:  Right Ear: Hearing, tympanic membrane, external ear and ear canal normal.  Left Ear: Hearing, tympanic  membrane, external ear and ear canal normal.  Nose: Right sinus exhibits maxillary sinus tenderness and frontal sinus tenderness. Left sinus exhibits maxillary sinus tenderness and frontal sinus tenderness.  Mouth/Throat: Uvula is midline, oropharynx is clear and moist and mucous membranes are normal.  Neck: Neck supple.  Cardiovascular: Normal rate, regular rhythm, normal heart sounds and intact distal pulses.   Pulmonary/Chest: Effort normal and breath sounds normal.  Neurological: She is alert and oriented to person, place, and time.  Skin: Skin is warm and dry.  Psychiatric: She has a normal mood and affect. Her behavior is normal. Judgment and thought content normal.       Assessment & Plan:   Problem List Items Addressed This Visit      Respiratory   Sinusitis, acute    Symptoms and exam consistent with maxillary sinusitis most likely bacterial given symptoms worsening and time. Start Augmentin. Start Tussionex as needed for cough and sleep. Start prednisone as needed for inflammation. Continue over-the-counter medications as needed for symptom relief and supportive care. Follow-up if symptoms worsen or do not improve.      Relevant Medications   amoxicillin-clavulanate (AUGMENTIN) 875-125 MG tablet    chlorpheniramine-HYDROcodone (TUSSIONEX PENNKINETIC ER) 10-8 MG/5ML SUER   predniSONE (DELTASONE) 20 MG tablet    Other Visit Diagnoses   None.      I am having Ms. Berwick start on amoxicillin-clavulanate, chlorpheniramine-HYDROcodone, and predniSONE. I am also having her maintain her dicyclomine, norethindrone-ethinyl estradiol 1/35, clobetasol cream, cholestyramine light, ibuprofen, calcium carbonate, SYMBICORT, traZODone, clonazePAM, metoprolol succinate, potassium chloride SA, LORazepam, meclizine, meloxicam, torsemide, DULoxetine, gabapentin, valsartan, VENTOLIN HFA, Vitamin D, Tiotropium Bromide Monohydrate, spironolactone, and levothyroxine.   Meds ordered this encounter  Medications  . amoxicillin-clavulanate (AUGMENTIN) 875-125 MG tablet    Sig: Take 1 tablet by mouth 2 (two) times daily.    Dispense:  20 tablet    Refill:  0    Order Specific Question:   Supervising Provider    Answer:   Hillard Danker A [[id number]]  . chlorpheniramine-HYDROcodone (TUSSIONEX PENNKINETIC ER) 10-8 MG/5ML SUER    Sig: Take 5 mLs by mouth at bedtime as needed.    Dispense:  115 mL    Refill:  0    Order Specific Question:   Supervising Provider    Answer:   Hillard Danker A [[id number]]  . predniSONE (DELTASONE) 20 MG tablet    Sig: Take 1 tablet (20 mg total) by mouth 2 (two) times daily with a meal.    Dispense:  10 tablet    Refill:  0    Order Specific Question:   Supervising Provider    Answer:   Hillard Danker A [[id number]]     Follow-up: Return if symptoms worsen or fail to improve.  Jeanine Luz, FNP

## 2016-01-13 NOTE — Patient Instructions (Signed)
Thank you for choosing [facility].  SUMMARY AND INSTRUCTIONS:  Medication:  Please start taking the Augmentin and Tussinex.    Follow up:  If your symptoms worsen or fail to improve, please contact our office for further instruction, or in case of emergency go directly to the emergency room at the closest medical facility.    General Recommendations:    Please drink plenty of fluids.  Get plenty of rest   Sleep in humidified air  Use saline nasal sprays  Netti pot   OTC Medications:  Decongestants - helps relieve congestion   Flonase (generic fluticasone) or Nasacort (generic triamcinolone) - please make sure to use the "cross-over" technique at a 45 degree angle towards the opposite eye as opposed to straight up the nasal passageway.   Sudafed (generic pseudoephedrine - Note this is the one that is available behind the pharmacy counter); Products with phenylephrine (-PE) may also be used but is often not as effective as pseudoephedrine.   If you have HIGH BLOOD PRESSURE - Coricidin HBP; AVOID any product that is -D as this contains pseudoephedrine which may increase your blood pressure.  Afrin (oxymetazoline) every 6-8 hours for up to 3 days.   Allergies - helps relieve runny nose, itchy eyes and sneezing   Claritin (generic loratidine), Allegra (fexofenidine), or Zyrtec (generic cyrterizine) for runny nose. These medications should not cause drowsiness.  Note - Benadryl (generic diphenhydramine) may be used however may cause drowsiness  Cough -   Delsym or Robitussin (generic dextromethorphan)  Expectorants - helps loosen mucus to ease removal   Mucinex (generic guaifenesin) as directed on the package.  Headaches / General Aches   Tylenol (generic acetaminophen) - DO NOT EXCEED 3 grams (3,000 mg) in a 24 hour time period  Advil/Motrin (generic ibuprofen)   Sore Throat -   Salt water gargle   Chloraseptic (generic benzocaine) spray or  lozenges / Sucrets (generic dyclonine)    Sinusitis Sinusitis is redness, soreness, and inflammation of the paranasal sinuses. Paranasal sinuses are air pockets within the bones of your face (beneath the eyes, the middle of the forehead, or above the eyes). In healthy paranasal sinuses, mucus is able to drain out, and air is able to circulate through them by way of your nose. However, when your paranasal sinuses are inflamed, mucus and air can become trapped. This can allow bacteria and other germs to grow and cause infection. Sinusitis can develop quickly and last only a short time (acute) or continue over a long period (chronic). Sinusitis that lasts for more than 12 weeks is considered chronic.  CAUSES  Causes of sinusitis include:  Allergies.  Structural abnormalities, such as displacement of the cartilage that separates your nostrils (deviated septum), which can decrease the air flow through your nose and sinuses and affect sinus drainage.  Functional abnormalities, such as when the small hairs (cilia) that line your sinuses and help remove mucus do not work properly or are not present. SIGNS AND SYMPTOMS  Symptoms of acute and chronic sinusitis are the same. The primary symptoms are pain and pressure around the affected sinuses. Other symptoms include:  Upper toothache.  Earache.  Headache.  Bad breath.  Decreased sense of smell and taste.  A cough, which worsens when you are lying flat.  Fatigue.  Fever.  Thick drainage from your nose, which often is green and may contain pus (purulent).  Swelling and warmth over the affected sinuses. DIAGNOSIS  Your health care provider will perform a  physical exam. During the exam, your health care provider may:  Look in your nose for signs of abnormal growths in your nostrils (nasal polyps).  Tap over the affected sinus to check for signs of infection.  View the inside of your sinuses (endoscopy) using an imaging device that has a  light attached (endoscope). If your health care provider suspects that you have chronic sinusitis, one or more of the following tests may be recommended:  Allergy tests.  Nasal culture. A sample of mucus is taken from your nose, sent to a lab, and screened for bacteria.  Nasal cytology. A sample of mucus is taken from your nose and examined by your health care provider to determine if your sinusitis is related to an allergy. TREATMENT  Most cases of acute sinusitis are related to a viral infection and will resolve on their own within 10 days. Sometimes medicines are prescribed to help relieve symptoms (pain medicine, decongestants, nasal steroid sprays, or saline sprays).  However, for sinusitis related to a bacterial infection, your health care provider will prescribe antibiotic medicines. These are medicines that will help kill the bacteria causing the infection.  Rarely, sinusitis is caused by a fungal infection. In theses cases, your health care provider will prescribe antifungal medicine. For some cases of chronic sinusitis, surgery is needed. Generally, these are cases in which sinusitis recurs more than 3 times per year, despite other treatments. HOME CARE INSTRUCTIONS   Drink plenty of water. Water helps thin the mucus so your sinuses can drain more easily.  Use a humidifier.  Inhale steam 3 to 4 times a day (for example, sit in the bathroom with the shower running).  Apply a warm, moist washcloth to your face 3 to 4 times a day, or as directed by your health care provider.  Use saline nasal sprays to help moisten and clean your sinuses.  Take medicines only as directed by your health care provider.  If you were prescribed either an antibiotic or antifungal medicine, finish it all even if you start to feel better. SEEK IMMEDIATE MEDICAL CARE IF:  You have increasing pain or severe headaches.  You have nausea, vomiting, or drowsiness.  You have swelling around your  face.  You have vision problems.  You have a stiff neck.  You have difficulty breathing. MAKE SURE YOU:   Understand these instructions.  Will watch your condition.  Will get help right away if you are not doing well or get worse. Document Released: 04/23/2005 Document Revised: 09/07/2013 Document Reviewed: 05/08/2011 [facility] Patient Information 2015 [place], [place]. This information is not intended to replace advice given to you by your health care provider. Make sure you discuss any questions you have with your health care provider.

## 2016-01-13 NOTE — Assessment & Plan Note (Signed)
Symptoms and exam consistent with maxillary sinusitis most likely bacterial given symptoms worsening and time. Start Augmentin. Start Tussionex as needed for cough and sleep. Start prednisone as needed for inflammation. Continue over-the-counter medications as needed for symptom relief and supportive care. Follow-up if symptoms worsen or do not improve.

## 2016-01-16 ENCOUNTER — Encounter (HOSPITAL_COMMUNITY): Payer: Self-pay

## 2016-01-18 ENCOUNTER — Encounter (HOSPITAL_COMMUNITY): Payer: Self-pay

## 2016-01-20 ENCOUNTER — Encounter (HOSPITAL_COMMUNITY): Payer: Self-pay

## 2016-01-20 ENCOUNTER — Other Ambulatory Visit: Payer: Self-pay | Admitting: Cardiology

## 2016-01-23 ENCOUNTER — Encounter (HOSPITAL_COMMUNITY): Payer: Self-pay

## 2016-01-25 ENCOUNTER — Encounter (HOSPITAL_COMMUNITY): Payer: Self-pay

## 2016-01-27 ENCOUNTER — Encounter (HOSPITAL_COMMUNITY): Payer: Self-pay

## 2016-01-30 ENCOUNTER — Encounter (HOSPITAL_COMMUNITY): Payer: Self-pay

## 2016-02-01 ENCOUNTER — Encounter (HOSPITAL_COMMUNITY): Payer: Self-pay

## 2016-02-03 ENCOUNTER — Encounter (HOSPITAL_COMMUNITY)
Admission: RE | Admit: 2016-02-03 | Discharge: 2016-02-03 | Disposition: A | Discharge status: Home / Self Care | Payer: Self-pay | Source: Ambulatory Visit | Attending: Cardiology | Admitting: Cardiology

## 2016-02-06 ENCOUNTER — Encounter (HOSPITAL_COMMUNITY)
Admission: RE | Admit: 2016-02-06 | Discharge: 2016-02-06 | Disposition: A | Discharge status: Home / Self Care | Payer: Self-pay | Source: Ambulatory Visit | Attending: Cardiology | Admitting: Cardiology

## 2016-02-06 DIAGNOSIS — I5042 Chronic combined systolic (congestive) and diastolic (congestive) heart failure: Secondary | ICD-10-CM | POA: Insufficient documentation

## 2016-02-08 ENCOUNTER — Encounter (HOSPITAL_COMMUNITY)
Admission: RE | Admit: 2016-02-08 | Discharge: 2016-02-08 | Disposition: A | Discharge status: Home / Self Care | Payer: Self-pay | Source: Ambulatory Visit | Attending: Cardiology | Admitting: Cardiology

## 2016-02-10 ENCOUNTER — Encounter (HOSPITAL_COMMUNITY): Payer: Self-pay

## 2016-02-13 ENCOUNTER — Encounter (HOSPITAL_COMMUNITY): Payer: Self-pay

## 2016-02-15 ENCOUNTER — Ambulatory Visit (INDEPENDENT_AMBULATORY_CARE_PROVIDER_SITE_OTHER): Payer: Commercial Managed Care - PPO | Admitting: Pulmonary Disease

## 2016-02-15 ENCOUNTER — Encounter (HOSPITAL_COMMUNITY)
Admission: RE | Admit: 2016-02-15 | Discharge: 2016-02-15 | Disposition: A | Discharge status: Home / Self Care | Payer: Self-pay | Source: Ambulatory Visit | Attending: Cardiology | Admitting: Cardiology

## 2016-02-15 ENCOUNTER — Encounter: Payer: Self-pay | Admitting: Pulmonary Disease

## 2016-02-15 VITALS — BP 110/86 | HR 85 | Temp 98.6°F | Ht 60.0 in | Wt 194.1 lb

## 2016-02-15 DIAGNOSIS — J4530 Mild persistent asthma, uncomplicated: Secondary | ICD-10-CM

## 2016-02-15 DIAGNOSIS — I10 Essential (primary) hypertension: Secondary | ICD-10-CM | POA: Diagnosis not present

## 2016-02-15 DIAGNOSIS — F4323 Adjustment disorder with mixed anxiety and depressed mood: Secondary | ICD-10-CM

## 2016-02-15 DIAGNOSIS — Z9989 Dependence on other enabling machines and devices: Secondary | ICD-10-CM

## 2016-02-15 DIAGNOSIS — M4125 Other idiopathic scoliosis, thoracolumbar region: Secondary | ICD-10-CM | POA: Diagnosis not present

## 2016-02-15 DIAGNOSIS — IMO0001 Reserved for inherently not codable concepts without codable children: Secondary | ICD-10-CM

## 2016-02-15 DIAGNOSIS — E6609 Other obesity due to excess calories: Secondary | ICD-10-CM | POA: Diagnosis not present

## 2016-02-15 DIAGNOSIS — G4733 Obstructive sleep apnea (adult) (pediatric): Secondary | ICD-10-CM

## 2016-02-15 DIAGNOSIS — I5042 Chronic combined systolic (congestive) and diastolic (congestive) heart failure: Secondary | ICD-10-CM | POA: Diagnosis not present

## 2016-02-15 DIAGNOSIS — Z6837 Body mass index (BMI) 37.0-37.9, adult: Secondary | ICD-10-CM | POA: Diagnosis not present

## 2016-02-15 DIAGNOSIS — I428 Other cardiomyopathies: Secondary | ICD-10-CM

## 2016-02-15 DIAGNOSIS — J984 Other disorders of lung: Secondary | ICD-10-CM

## 2016-02-15 MED ORDER — AMOXICILLIN-POT CLAVULANATE 875-125 MG PO TABS: 1.0000 | ORAL_TABLET | Freq: Two times a day (BID) | ORAL | 1 refills | Status: DC

## 2016-02-15 MED ORDER — ALBUTEROL SULFATE HFA 108 (90 BASE) MCG/ACT IN AERS: 1.0000 | INHALATION_SPRAY | Freq: Four times a day (QID) | RESPIRATORY_TRACT | 11 refills | Status: DC | PRN

## 2016-02-15 NOTE — Progress Notes (Signed)
Subjective:     Patient ID: Meredith Diaz, female   DOB: July 23, 1980, 35 y.o.   MRN: [id number]  HPI 35 y/o WF, referred by DrCrawford for a pulmonary evaluation due to a hx of Asthma and increased DOE;  She has a complex hx of congenital heart dis and a cardiomyopathy w/ combined sys&diast CHF; hx anorexia as a teen=> morbid obesity as a young adult; & scoliosis/ chest wall deformity w/ pulmonary restrictive disease...   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, Programmer, multimedia for [employer], pt of DrKollar & referred for pulmonary evaluation w/ hx asthma- she notes increased DOE & no relief from her inhalers> Meredith Diaz is a never smoker & was diagnosed w/ asthma first in 1 at age 36 or so when [facility] for pneumonia (Cariology said she was hosp for CHF w/ EF=25-30% and was in hosp for 59mo) & she's had trouble ever since then, disch on NEBS;  Treated over the yrs w/ MDIs, Symbicort/Advair (the latter didn't help), last had Pred before 2013 hosp;  She was allergy tested in middle school & essentially neg x mild grass reaction by her memory;  She gets "sinus infections" a couple of times each yr (she thinks this is her main trigger) & uses Claritin/ Flonase prn;  Symptoms included SOB/ DOE w/ min activ, feels like she can't get a deep breath/ can't get enough air "in"/ not satisfied breathing (states this sensation is present all the time/constant, & she notes "gasping"); states she notes some wheezing when supine at night & some voice issues (?VCD, tight burning in throat) but denies reflux/ dysphagia/ nocturnal cough or choking (note- she has IBS & s/pGB 2013)... She also c/o sleep issues> can't sleep well while lying down, husb c/o her "wheezing"/ some snoring (but does not indicate that she stops breathing)/ restless sleeper/ leg jerks; she does have some daytime sleepiness issues w/ incr fatigue at work and she'll take naps on weekends; ESS=12... Current Pulm Meds> Symbicort160-  2puffs daily, AlbutHFA for prn use...       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 Cumulative data from cardiac evals as follows, she was told her cardiac issues are ?congenital, ?virus damaged her heart>> she has never had cardiac rehab etc...   CXR 2013 at [facility] showed cardiomegaly, biapical pleural thickening, otherw clear & NAD...  Cardiac MRI from [facility] in 2013 showed EF=52% w/ mild global HK, min AI, RV- wnl, Ao- wnl, mild pectus excavqatum w/ deviation of cardiac apex...  Cath was done in 2013 but I cannot find that report (said no signif CAD)...  EKG 11/15 showed STachy, rate 103, ?LAE, suggestive LVH, NSSTTWA...   2DEcho 11/15 by DrHarding showed mild LVH, ?poss apical variant hypertrophic cardiomyopathy, EF=40-45% w/ diffuse HK, Gr1DD, MV- mildly thickened leaflets and trivMR, norm LA size...  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 Notes hx anorexia/ depression when in high school w/ lowest wt=80 lbs; psyche rx & counseling resulted in wt gain & she was ~150 lbs in college; gained to  180# after college & lost wt to 165 prior to her marriage at age 30; now she is 35 y/o & has gained to max 203#... She is allergic to ACE inhibitors w/ prev angioedema...  EXAM reveals Afeb, VSS, O2sat=93% on RA at rest;  HEENT- neg, mallampati 2, voice sl hoarse;  Chest- decr BS bilat, clear, decr diaph excursions by percussion, freq sign breaths;  Heart- RRR w/p m/r/g;  Ext- no c/c/e...  Recent LABS> Chems- wnl;  CBC- ok w/ Hg=12.4, WBC=14K;  TSH=2.62;  B12 level= 180-250 (on  oral B12- 1000mcg/d)...  CXR 08/16/14 showed norm heart size, clear lungs, NAD...  Spirometry 08/16/14> poor effort (breathing "high" in her lung volumes)> FVC=0.84 (27%), FEV1=0.66 (24%), %1sec=78, mid-flows reduced at 18% predicted; Test indicated severe restriction w/ small airways dis...   Ambulatory oxygen saturations> O2sat=95% on RA at rest w/ pulse=89/min;  She walked 2 laps and stopped due to SOB- O2sat=83% w/ pulse 121/min...  FENO= 10ppb (results <25ppd implies absence of eosinophilic airway inflammation).  Epworth Sleepiness Score= 12 (indicating that she may be excessively sleepy depending on the situation... IMP/PLAN>>  Clarence has been diagnosed w/ asthma over the last 20+yrs but her current symptoms and spirometry looks more restricted- prob due to "chest wall factors" as we discussed;  She has additional issues from obesity- r/o OSA, and from her Cardiac disease- hx congenital HD w/ surg, HBP, cardiomyopathy w/ combined sys/diast CHF- followed & treated by Cards, DrHarding... We discussed the nature of her dyspnea and the need for additional testing and a trial of KLONOPIN 0.5mg Bid... We will sched FullPFTs and a Sleep Study and have her return after that...  ~  August 26, 2014:  2wk ROV & Meredith Diaz returns having completed her Full PFTs and Sleep study> she has in addition been on the Klonopin 0.5mg Bid but notes no improvement in her breathing on this dose (tol well just no better)...  Full PFTs 08/25/14 showed FVC=0.87 (26%), FEV1=0.77(27%), %1sec=88, mid-flows reduced at 28% predicted; after bronchodil- the FEV1 improved 6%; Lung volumes reduced w/ TLC=1.88 (42%), RV=0.85 (66%), RV/TLC=45 (some air trapping); DLCO=25% predicted, but corrects to normal (102%) when alveolar ventilation is taken into account...  Sleep Polysomnogram 08/18/14> Mod OSA w/ AHI=20/hr and REM AHI=54, mod snoring, desaturation to 64%...  IMP/PLANS>>  PFTs severely restricted (?effort/ accuracy) & seems to be  mostly related to obesity/ chest wall and not to underlying lung parenchymal factors (CXR clear, exam clear, we will consider hi-res CTChest later if needed); In this regard we will increase the KLONOPIN to 1mgBid before giving up on this med; she understands how important wt reduction is to her overall improvement- may want to consider eating disorder counseling again... Her Sleep study reveals mod OSA & nocturnal hypoxemia- I discussed w/ DrClance & we decided on CPAP set up> RESMED S10 machine- air/auto w/ heated humidity & climate controlled tubing, set on AUTO 5-15, enroll in AirView, mask interface of choice per DME company & pt preference... We will plan ROV recheck in 6 weeks to review compliance & efficacy, then consider doing an ONO...   ~  October 14, 2014:  6wk ROV & Valleri has lost 2# in the interval but not able to exercise- walks some, gets winded, stairs are difficult but able to negotiate one flight; we reviewed how critically important wt reduction is for her;  She still appears sl hoarse to me, scratchy voice which she believes is due to "allergy issues" => rec ENT evaluation for   completeness;  She increased the Klonopin to 1mgBid & this is helping some she says- not as SOB at rest, able to get a deeper breath she thinks, etc;  Using CPAP satis w/ good compliance & download data showing AHI<1/hr on CPAP; she has nasal pillows but wants full face mask & we will let [employer] know... We reviewed the following medical problems>>     DYSPNEA- multifactorial> ?asthma is a small component w/ restrictive dis due to obesity & scoliosis as a major factor, and CHF, deconditioning, anxiety as additional factors...    OSA>  Mod OSA on sleep study 4/16 w/ AHI=20/hr and REM AHI=54, mod snoring, desaturation to 64%; started on CPAP via [employer], auto 5-15 w/ good compliance => AHI <1/hr on the CPAP autoset 5-15, she wants to change nasal pillows to full face mask, we will check w/ [employer], then do ONO...    Hx  asthma> on Symbicort160- 2sp daily, VentolinHFA rescue prn- using 2-3x/wk; PFT 4/16 w/ severe restriction, + small airways dis & min response to bronchodil; asked to continue inhalers for now...    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    Hx HBP> on MetopER25, Apres25- 1.5 tabsTid, Lasix40- 1.5tabsQam, Zaroxyln2.5 prn swelling- taking it almost every day; K=4.2 5/16 at [facility]; Note> allergic to ACE w/ angioedema of lips in past; BP= 130/88 today w/o CP etc...    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    Eating disorder/ Morbid Obesity> unusual hx of eating disorder w/ ANOREXIA as teen=> OBESITY w/ wt=201#, 5' Tall, BMI=39-40; she notes hx anorexia/ depression when in high school w/ lowest wt=80 lbs; psyche rx & counseling resulted in wt gain & she was ~150 lbs in college; gained to 180# after college & lost wt to 165 prior to her marriage at age 30 (she felt better when wt was down); now she is 35 y/o & has gained to max 203#...    Hypothyroidism> on Synthroid50; Labs 12/15 showed TSH=2.62, FreeT4=0.87    Hx IBS & s/p GB in 2013> aware...    Scoliosis & back pain> she was prev evaluated at [facility] by a back specialist, she does not recall the details, no records available, she notes legs get tired, she is not falling, notes that her hands shake on occas... This is contrib to her pulmonary restriction and dyspnea => need back films then consider  referral the [facility] spine center for their review & recommendations...    VitB12 defic> Labs showed B12 blood level 179-248 and she is rec to take B12 1000mcg orally Qd w/ recheck levels...     Anxiety/ depression> on Klonopin 1mg Bid at present... EXAM reveals Afeb, VSS, O2sat=94% on RA at rest; HEENT- neg, mallampati 2, voice sl hoarse;  Chest- decr BS bilat, clear, decr diaph excursions by percussion, occas sign breaths;  Heart- RRR w/p m/r/g;  Ext- no c/c/e.  LABS 5/16 at [facility] showed Chems- wnl w/ Cr=1.04, BS=83, A1c=5.8;  FLP- at   goals on diet alone...  [employer] AirView download> 5/11 - 10/13/14 showed good compliance w/ AHI <1/hr on the CPAP autoset 5-15... PLAN>>  She has a very complex medical situation as outlined above, and we are hampered by lack of old records (I have asked her to help in getting this data);  For now she will continue the Symbicort, Ventolin, Klonopin, & CPAP; we will have [employer] look into full face mask per her preference; then proceed w/ ONO;  She knows the importance of wt reduction & may want to consider counseling from Dr. Ed Lurie for eating disorder;  Continue cardiac meds and f/u w/ DrHarding;  She is only 35 y/o w/ signif pulm restriction & heart disease;  We will XRay her spine in anticipation of getting old records from [facility] & poss referral if this is playing a signif roll as well...  ~  December 14, 2014:  2mo ROV & Canna returns for follow up> stable on her current regimen; she has multifactorial dyspnea>     OSA>  Mod OSA on sleep study 4/16 w/ AHI=20/hr and REM AHI=54, mod snoring, desaturation to 64%; started on CPAP via [employer], auto 5-15 w/ good compliance => AHI <1/hr on the CPAP autoset 5-15, she changed nasal pillows to full face mask, we will check download on FM, then do ONO...    Hx asthma> on Symbicort160- 2sp daily, VentolinHFA rescue prn- using 2-3x/wk; PFT 4/16 w/ severe restriction, + small airways dis & min response to bronchodil; asked to continue  inhalers for now...    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    Hx HBP> on MetopER25, Apres25- 1.5 tabsTid, Lasix40- 1.5tabsQam, Zaroxyln2.5 prn swelling- taking it almost every day; K=4.2 5/16 at [facility]; Note> allergic to ACE w/ angioedema of lips in past; BP= 130/88 today w/o CP etc...    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    Eating disorder/ Morbid Obesity> unusual hx of eating disorder w/ ANOREXIA as teen=> OBESITY w/ wt=204#, 5' Tall, BMI=39-40; she notes hx anorexia/ depression when in high school w/ lowest wt=80 lbs; psyche rx & counseling resulted in wt gain & she was ~150 lbs in college; gained to 180# after college & lost wt to 165 prior to her marriage at age 30 (she felt better when wt was down); now she is 35 y/o & has gained to max 204#... EXAM reveals Afeb, VSS, O2sat=95% on RA at rest; HEENT- neg, mallampati 2, voice sl hoarse;  Chest- decr BS bilat, clear, decr diaph excursions by percussion, occas sign breaths;  Heart- RRR w/p m/r/g;  Ext- no c/c/e. PLAN>>  Liliani will soon start cardiac rehab w/ it's exercise & nutritional counseling- it is imperitive that she get the weight down!!!  We will check CPAP download on full face mask & then ONO on the CPAP set up to be sure that she is not desaturating...  ADDENDUM>>  CPAP download 8/10 - 01/13/15 on autoset 5-15 and now   w/ FULL FaceMask showed 90% compliance, ave 5-6H per night,  AHI=0.6.Marland KitchenMarland Kitchen Continue same. ADDENDUM>>  ONO done 01/13/15 on CPAP on RA showed O2sat=88% for 25.10mn of the 7H study; lowest O2sat=63%; oxygen desat index=51 desat events per hr; she qualifies for O2 bleed-in at 2L/min for her CPAP unit...  ~  March 17, 2015:  327moOV & ElLadenas in Cardiac Rehab per DrHarding, sl improved & planning to continue in maintenance program;  She notes that her breathing is "ok" except w/ weather changes, she's been using the O2 bled into her CPAP Qhs; states she awakes at 3-4AM & can't get back to sleep- she remains on Klonopin 68m68mid, she tried TylenoloPM w/o help, tried her Lorazepam w/o help, we discussed trial of DESYREL25-50Qhs...     [facility] by [facility] w/ CP> known nonischemic cardiomyop w/ EF=40-45%, stress test done by Cards 12/06/14 was neg for ischemia but showed poor exercise tol & hypertensive response; she had CTAngio in ER- neg for PE, CP resolved w/ MS; serial EKG & enz were neg- they added Aldactone and disch for Cards f/u...  She saw DrHarding 01/27/15 => meds adjusted & his note is reviewed, they plan rov in Dec...     She continues to f/u w/ DrKollar who started Cymbalta for depression- slight improved... EXAM reveals Afeb, VSS, O2sat=97% on RA at rest; HEENT- neg, mallampati 2, voice sl hoarse;  Chest- decr BS bilat, decr chest wall movement and decr diaph excursions by percussion, occas sign breaths, clear;  Heart- RRR w/p m/r/g;  Ext- no c/c/e.  CXR 01/05/15 showed ?left pleural thickening, r/o effusion, no focal airsp dis, scoliosis...  CT Angio Chest 01/05/15 showed mild cardiomeg, no evid of PE, ectasia of Ao measuring 3.7cm, no adenopathy, no consolidation or effusion, min Atx at left base, fusion of several vertebrae, fusion of several post ribs bilat...  LABS 12/2014> K was low at 3.0=> supplemented to norm;  BS in the 120-130 range;  Cr in the 1.1-1.2 range;  Troponins=neg;  BNP=19;  CBC- wnl... IMP/PLAN>>  She has severe pulmonary  restriction- obesity, scoliosis, ankylosis;  Weight is down to 187# on diet & cards rehab exercises;  rec to continue current meds and we will try Desyrel 25-50 Qhs for sleep...  ~  June 20, 2015:  24mo46mo & last visit we continued same treatment regimen (encouraged diet, exercise, wt reduction) & added Desyrel25=>50Qhs to help her rest thru the night; ElliGussieorts that this was NOT working, still wakes at 3-4AYRC World[employer]els exhausted all the time, plus it caused her to ?rage & ?scream she says;  She continues in [facility] maintenance program & she endorses better stamina when walking but still feels SOB "can't get enough air" despite the Klonopin 68mg 568m; while being monitored during exercise she says her O2sats drop & I've asked her to check this formally & keep record to see if her ambulatory O2 needs modification (today in office her lowest O2sat=90% w/ exercise);  She tells me that DrCrawford (seen 2/17 c/o hair falling out, aching all over, exhausted, panic attacks) [facility] due to CBC w/ WBC=13-17K w/ prev norm diff; I suggested to the pt that her PCP may consider referral to Rheum for poss FM/CFS eval...     Hx asthma> on Symbicort160- 2sp daily, VentolinHFA rescue prn- using 1-2x/wk; PFT 4/16 w/ severe restriction, + small airways dis & min response to bronchodil; asked  to continue inhalers for now...    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    Hx HBP> on MetopER50, Diovan40Bid, Demadex20, Aldactone25, K20Bid, off Apres/ off Zarox; lab 2/17 w/ K=4.7 (norm renal & BS); Note> allergic to ACE w/ angioedema of lips in past; BP= 102/70 today w/o CP etc...    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   - Cardiac MRI (2013) with EF 52%, no delayed enhancement.   - LHC in 2013 without significant CAD.  - 11/15 echo with EF 40-45%.   - 7/16 echo with EF 40-45%, grade II diastolic dysfunction (Last 2DEcho 7/16 showed EF=40-45% w/ diffuse HK, Gr2DD, norm valves, norm LA size; PA & RV are wnl)  - ETT (8/16) with poor exercise tolerance but no ischemia.   - CPX (12/16): peak VO2 15.7 (23.5 adjusted for ideal body weight), VE/VCO2 slope 21, RER 1.15 => moderate functional limitation thought to be primarily due to restrictive lung physiology.     Eating disorder/ Morbid Obesity> unusual hx of eating disorder w/ ANOREXIA as teen=> OBESITY w/ wt=204#, 5' Tall, BMI=39-40; she notes hx anorexia/ depression when in high school w/ lowest wt=80 lbs; psyche rx & counseling resulted in wt gain & she was ~150 lbs in college; gained to 180# after college & lost wt to 165 prior to her marriage at age 7 (she felt better when wt was down); now she is 35 y/o & has gained to max 204#...    Hypothyroidism> on Synthroid88; Labs 2/17 showed TSH=2.19...    Hx IBS & s/p GB in 2013> aware...    Scoliosis & back pain> she was prev evaluated at [facility] by a back specialist, she does not recall the details, no records available, she notes legs get tired, she is not falling, notes that her hands shake on occas... This is contrib to her pulmonary restriction and dyspnea => need back films then consider referral the [facility] [facility] center for their review & recommendations...    VitB12 defic> Labs showed B12 blood level 179-248 and she is rec to take B12 1081mg orally Qd w/ recheck levels; Labs 2/17 showed B12=313    Anxiety/ depression> on Klonopin 154mBid at present & uses Ativan1m10mrn... EXAM reveals Afeb, VSS, O2sat=98% on RA at rest; 5'  Tall, wt=188#, BMI=36-37;  HEENT- neg, mallampati 2, voice sl hoarse;  Chest- decr BS bilat, decr chest wall movement and decr diaph excursions by percussion, occas sign breaths, clear;  Heart- RRR w/p m/r/g;  Ext- no c/c/e.  Cardio-Pulm exercise test 04/13/15 by DrMcLean>  Severe restrictive dis w/ decr in MVV; O2 desat to 87% w/ peak exercise...   Ambulatory Oximetry 06/20/15> O2sat=96% on RA at rest w/ pulse=89/min; she ambulated 3 laps on RA w/ lowest O2sat=90% w/ pulse=121/min...  CPAP download from 05/21/15=>06/19/15 showed 28/30d >4H, averaging 5-6H per night, Autoset5-15 w/ pressures 6-10cm & AHI=0;  rec to continue same... IMP/PLAN>>  She will continue same meds, continue her diet & [facility] [place] [facility] [place] there; we  refilled her Ativan to use prn for her panic attacks which are undoubtedly playing a roll in her dyspnea events...   ~  November 15, 2015:  35mo ROV & Arbor continues in the Cardiac Rehab maintenance program where she has plateaued but still working hard; Approx 21mo ago she fell w/ pain in back & knee, she saw DrZSmith & benefitted from brief course of Pred;  She reports incr SOB, chest feels tight, some wheezing noted esp in hot humid weather;  On Symbicort160-2spBid, AlbutHFA prn, she notes that [facility] helps more, notes Singulair didn't help in the past; we decided to add INCRUSE one puff daily & reminded to use her IS regularly for the restrictive lung component... See prob list above...    EXAM reveals Afeb, VSS, O2sat=98% on RA at rest; 5' Tall, wt=186#, BMI=36;  HEENT- neg, mallampati 2, voice sl hoarse;  Chest- decr BS bilat, decr chest wall movement and decr diaph excursions by percussion, occas sign breaths, clear;  Heart- RRR w/p m/r/g;  Ext- no c/c/e.  CPAP download 6/10 - 11/13/15>>  CPAP used 30/30 days, ave ~6H per night, Autoset 5-15, ave pressure= 11-12, working very well w/ AHI=0.1, small leak...  LABS 11/15/15>  CBC- Hg=12.9, mcv=87, wbc=19.1, eos=1%;   IgE=61;  RAST panel- MOD for molds, BORDERLINE for grasses/ few trees/ ragweed... IMP/PLAN>>  She is getting B12 shots from her PCP- DrCrawford;  We discussed continuing the Symbicort160-2spBid & AlbutHFA rescue inhaler prn; We are adding Incruse once daily; Reminded to use the IS frequently to expand the lungs w/ her chest wall restricted dis...   ~  February 15, 2016:  25mo ROV & ellis05/16/35 y/o grandmother passed away suddenly last month ([place] after femoral hernia surg);  We reviewed mult medical problems during today's office visit >>     She saw DrCrawford 01/02/16 for medical f/u- note reviewed, mood stable on Cymbalta & klonopin, exercising at cards rehab, using CPAP (she wanted another sleep test since she's lost 20 lbs), taking B12, taking thyroid;  She is also followed by DrZSmith- seen 12/2015 w/ several issues and his note is reviewed (back pain improved and neuropathy symptoms better on Neurontin)...    She reports that her breathing has been OK but the humidity is a particular prob for her- she was seen by PCP 01/13/16 w/ incr chest congestion, cough & SOB, T99.5, incr use of rescue inhaler, chest was reported clear & she was Dx w/ sinusitis & givenAugmentin, Pred, Tussionex    Hx asthma> on Symbicort160- 2sp daily, Spiriva Respimat-2sp/d, VentolinHFA rescue prn- using 1-2x/wk; PFT 4/16 w/ severe restriction, + small airways dis & min response to bronchodil; asked to continue inhalers for now...    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    Hx HBP> on MetopER50, Diovan40Bid, Demadex20, Aldactone25, K20Bid, off Apres/ off Zarox; lab 2/17 w/ K=4.7 (norm renal & BS); Note> allergic to ACE w/ angioedema of lips in past; BP= 110/80 today w/o CP etc...    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     EXAM reveals Afeb, VSS, O2sat=98% on RA at rest; 5' Tall, wt=194#, BMI=37;  HEENT- neg, mallampati 2, voice sl hoarse;  Chest- decr BS bilat, decr chest wall movement and decr diaph excursions by percussion, occas sigh breaths, clear;  Heart- RRR w/p m/r/g;  Ext- no c/c/e. IMP/PLAN>>  Rennis Hardingllis is stable on her current regimen- we reviewed regular dosing of her inhalers, taking meds regularly, continued [facility] exercises and DIET for weight reduction;  She would like some [id number] to keep on hand this winter- OK; we plan rov 22mo, sooner if needed prn...    Past Medical History  Diagnosis Date    DYSPNEA> restrictive lung dis, morbid obesity... OSA> Sleep study 08/2014 w/ AHI=20, worse in REM, desat to 64%... Hx anorexia => morbid obesity...    . Chronic diastolic congestive heart failure, NYHA class 2 07/2011    Echo: EF was 25-30%; No Sig CAD on CATH; No significant finding on Cardiac MRI; follow-up Ech0 2014 --  EF of 50%  . Asthma   . Thyroid disease  >>  On Synthroid50, TSH 12/15 = 2.62   . Depression   . Anxiety   . Essential hypertension   . Stomach problems   . Hypertension   . 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 Cardiomyopathy     Unclear etiology. Last EF by echo 40-45% with global hypokinesis, mild LVH possible apical variant hypertrophic cardiomyopathy        B12 Deficiency >> on oral B12 supplement 105300mcg/d;   Labs showed B12 level 178 => 313    Past Surgical History:  Procedure Laterality Date  . CARDIAC CATHETERIZATION  08/2011   Normal Coronaries - LVEDP 32 mmHg  . Cardiac MRI  April 2013   No suggestion of a congenital abnormality; EF ~ 52% - no regional wall motion abnormalities., no increased myocardial signal intensity. No perfusion abnormality.  . CHOLECYSTECTOMY  2013  . TRANSTHORACIC ECHOCARDIOGRAM  07/2011   Moderate concentric LVH, mildly dilated. EF 25-30% no diastolic dysfunction parameters to suggest elevated LAP  . 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 Normal wall thickness (compared to prior reading of possible apical LVH), grade 2 DD -- with elevated LV filling pressures (however left atrium was read as normal size), normal artery function and size. No no suggestion of pulmonary hypertension    Outpatient Encounter Prescriptions as of 02/15/2016  Medication Sig  . albuterol (VENTOLIN HFA) 108 (90 Base) MCG/ACT inhaler Inhale 1-2 puffs into the lungs every 6 (six) hours as needed for wheezing or shortness of breath.  . calcium carbonate (TUMS - DOSED IN MG ELEMENTAL CALCIUM) 500 MG chewable tablet Chew 1 tablet by mouth daily as needed for indigestion or heartburn. Reported on 06/15/2015  . Cholecalciferol (VITAMIN D) 2000 units CAPS Take by mouth daily.  . cholestyramine light (PREVALITE) 4 GM/DOSE powder Take 4 g by mouth as needed (DIARRHEA DUE TO BILE ACIDS).   . clobetasol cream (TEMOVATE) 0.05 % Apply 1 application topically as needed (for Sclerosis).   . clonazePAM (KLONOPIN) 1  MG tablet Take 1 tablet (1 mg total) by mouth 2 (two) times daily. (Patient taking differently: Take 1 mg by mouth daily. )  . dicyclomine (BENTYL) 20 MG tablet Take 1 tablet (20 mg total) by mouth as needed for spasms.  . DULoxetine (CYMBALTA) 60 MG capsule TAKE  1 CAPSULE (60 MG TOTAL) BY MOUTH DAILY.  [name] ibuprofen (ADVIL,MOTRIN) 200 MG tablet Take 400 mg by mouth every 6 (six) hours as needed for moderate pain.  [name] levothyroxine (SYNTHROID, LEVOTHROID) 125 MCG tablet Take 1 tablet (125 mcg total) by mouth daily.  [name] LORazepam (ATIVAN) 1 MG tablet Take 0.5-1 tablets (0.5-1 mg total) by mouth 3 (three) times daily as needed for anxiety.  . metoprolol succinate (TOPROL-XL) 50 MG 24 hr tablet Take 1 tablet (50 mg total) by mouth daily. Take with or immediately following a meal.  . potassium chloride SA (K-DUR,KLOR-CON) 20 MEQ tablet Take 2 tablets (40 mEq total) by mouth daily.  [name] spironolactone (ALDACTONE) 25 MG tablet TAKE 1 TABLET EVERY DAY  . SYMBICORT 160-4.5 MCG/ACT inhaler INHALE 2 PUFFS INTO THE LUNGS DAILY. (Patient taking differently: INHALE 2 PUFFS INTO THE LUNGS TWICE DAILY)  . Tiotropium Bromide Monohydrate (SPIRIVA RESPIMAT) 2.5 MCG/ACT AERS Inhale 2 puffs into the lungs daily.  [name] torsemide (DEMADEX) 20 MG tablet TAKE 1 TABLET (20 MG TOTAL) BY MOUTH DAILY.  . traZODone (DESYREL) 50 MG tablet Take 1/2-1 tablet by mouth in evening as needed for sleep  . valsartan (DIOVAN) 40 MG tablet TAKE 1 TABLET (40 MG TOTAL) BY MOUTH 2 (TWO) TIMES DAILY.  . [DISCONTINUED] VENTOLIN HFA 108 (90 Base) MCG/ACT inhaler INHALE 2 PUFFS INTO THE LUNGS AS NEEDED FOR WHEEZING OR SHORTNESS OF BREATH.  [name] amoxicillin-clavulanate (AUGMENTIN) 875-125 MG tablet Take 1 tablet by mouth 2 (two) times daily. As directed  . [DISCONTINUED] amoxicillin-clavulanate (AUGMENTIN) 875-125 MG tablet Take 1 tablet by mouth 2 (two) times daily. (Patient not taking: Reported on 02/15/2016)  . [DISCONTINUED] chlorpheniramine-HYDROcodone (TUSSIONEX PENNKINETIC ER) 10-8 MG/5ML SUER Take 5 mLs by mouth at bedtime as needed. (Patient not taking: Reported on 02/15/2016)  . [DISCONTINUED] gabapentin (NEURONTIN) 100 MG capsule Take 2 capsules (200 mg total) by mouth at bedtime. (Patient not taking:  Reported on 02/15/2016)  . [DISCONTINUED] meclizine (ANTIVERT) 25 MG tablet Take 25 mg by mouth 3 (three) times daily as needed for dizziness.  . [DISCONTINUED] meloxicam (MOBIC) 15 MG tablet Take 15 mg by mouth daily as needed for pain.  . [DISCONTINUED] norethindrone-ethinyl estradiol 1/35 (ALAYCEN 1/35) tablet Take 1 tablet by mouth daily. (Patient not taking: Reported on 02/15/2016)  . [DISCONTINUED] predniSONE (DELTASONE) 20 MG tablet Take 1 tablet (20 mg total) by mouth 2 (two) times daily with a meal. (Patient not taking: Reported on 02/15/2016)  . [DISCONTINUED] torsemide (DEMADEX) 20 MG tablet Take 1 tablet (20 mg total) by mouth daily. (Patient not taking: Reported on 02/15/2016)   No facility-administered encounter medications on file as of 02/15/2016.     Allergies  Allergen Reactions  . Lisinopril Swelling    Angioedema facial, mostly just lips, no trouble breathing    Current Medications, Allergies, Past Medical History, Past Surgical History, Family History, and Social History were reviewed in [employer] record.   Review of Systems             All symptoms NEG except where BOLDED >>  Constitutional:  Denies F/C/S, anorexia, unexpected weight change. HEENT:  No HA, visual changes, earache, nasal symptoms, sore  throat, hoarseness. Resp:  cough, sputum, hemoptysis; SOB, tightness, wheezing. Cardio:  CP, palpit, DOE, orthopnea, edema. GI:  Denies N/V/D/C or blood in stool; no reflux, abd pain, distention, or gas. GU:  No dysuria, freq, urgency, hematuria, or flank pain. MS:  Denies joint pain, swelling, tenderness, or decr ROM; no neck pain, back pain, etc. Neuro:  No tremors, seizures, dizziness, syncope, weakness, numbness, gait abn. Skin:  No suspicious lesions or skin rash. Heme:  No adenopathy, bruising, bleeding. Psyche: Denies confusion, sleep disturbance, hallucinations, anxiety, depression.   Objective:   Physical Exam    Vital Signs:   Reviewed...   General:  WD, overweight, 35 y/o WF in NAD; alert & oriented; pleasant & cooperative... HEENT:  [place]/AT; Conjunctiva- pink, Sclera- nonicteric, EOM-wnl, PERRLA, EACs-clear, TMs-wnl; NOSE-clear; THROAT-clear & wnl.  Neck:  Supple w/ fair ROM; no JVD; normal carotid impulses w/o bruits; no thyromegaly or nodules palpated; no lymphadenopathy.  Chest:  Clear to P & A; without wheezes, rales, or rhonchi heard (decr BS at bases) Heart:  Regular Rhythm; gr1/6 SEM, without rubs or gallops detected. Abdomen:  Obese, soft, nontender- no guarding or rebound; normal bowel sounds; no organomegaly or masses palpated. Ext:  Normal ROM; without deformities or arthritic changes; no varicose veins, +venous insuffic, tr edema;  Pulses intact w/o bruits. Neuro:  No focal neuro deficits, gait normal & balance OK. Derm:  No lesions noted; no rash etc. Lymph:  No cervical, supraclavicular, axillary, or inguinal adenopathy palpated.   Assessment:      DYSPNEA>> multifactorial Restrictive lung dis- felt to be due to obesity & chest wall factors and not parenchymal pulm dis => continue Klonopin 1mg  Bid... OSA w/ mod AHI=20/hr (worse w/ REM & desat to 64%) on sleep study 08/2014 => now on CPAP Auto 5-15 w/ good compliance and AHI<1, ONO showed signif hypoxemia on the CPAP therefore Oxygen added Qhs... Hx eating disorder w/ anorexia => morbid obesity and she may benefit from psyche counseling again... Hx congenital HD w/ surg 1981, HBP, nonischemic cardiomyopathy w/ combined sys/diast CHF- followed & treated by [facility], DrHarding...   PLAN>>  She has a very complex medical situation as outlined above, and we are hampered by lack of old records (I have asked her to help in getting this data);  For now she will continue the Symbicort, Ventolin, Klonopin, & CPAP; She knows the importance of wt reduction & may want to consider counseling from Dr. Stephania Fragmin for eating disorder;  Continue cardiac meds and f/u w/  DrHarding;  She is only 35 y/o w/ signif pulm restriction & heart disease... 12/14/14> Maily will soon start cardiac rehab w/ it's exercise & nutritional counseling- it is imperitive that she get the weight down!!!  We will check CPAP download on full face mask & then ONO on the CPAP set up to be sure that she is not desaturating... ROV planned 3 months 03/17/15> She has severe pulmonary restriction- obesity, scoliosis, ankylosis;  Weight is down to 187# on diet & [facility] exercises;  rec to continue current meds and we will try Desyrel 25=50 Qhs for sleep. 06/20/15>  She will continue same meds, continue her diet & exercise at [facility] & keep record of O2sats there; we refilled her Ativan to use prn for her panic attacks which are undoubtedly playing a roll in her dyspnea. 11/15/15>   She is getting B12 shots from her PCP- DrCrawford;  We discussed continuing the Symbicort160-2spBid & AlbutHFA rescue inhaler prn; We  are adding Incruse once daily; Reminded to use the IS frequently to expand the lungs w/ her chest wall restricted dis. 02/15/16>   Cozette is stable on her current regimen- we reviewed regular dosing of her inhalers, taking meds regularly, continued [facility] exercises and DIET for weight reduction;  She would like some Augmentin875 to keep on hand this winter- OK.     Plan:     Patient's Medications  New Prescriptions   AMOXICILLIN-CLAVULANATE (AUGMENTIN) 875-125 MG TABLET    Take 1 tablet by mouth 2 (two) times daily. As directed  Previous Medications   CALCIUM CARBONATE (TUMS - DOSED IN MG ELEMENTAL CALCIUM) 500 MG CHEWABLE TABLET    Chew 1 tablet by mouth daily as needed for indigestion or heartburn. Reported on 06/15/2015   CHOLECALCIFEROL (VITAMIN D) 2000 UNITS CAPS    Take by mouth daily.   CHOLESTYRAMINE LIGHT (PREVALITE) 4 GM/DOSE POWDER    Take 4 g by mouth as needed (DIARRHEA DUE TO BILE ACIDS).    CLOBETASOL CREAM (TEMOVATE) 0.05 %    Apply 1 application topically as needed  (for Sclerosis).    CLONAZEPAM (KLONOPIN) 1 MG TABLET    Take 1 tablet (1 mg total) by mouth 2 (two) times daily.   DICYCLOMINE (BENTYL) 20 MG TABLET    Take 1 tablet (20 mg total) by mouth as needed for spasms.   DULOXETINE (CYMBALTA) 60 MG CAPSULE    TAKE 1 CAPSULE (60 MG TOTAL) BY MOUTH DAILY.   IBUPROFEN (ADVIL,MOTRIN) 200 MG TABLET    Take 400 mg by mouth every 6 (six) hours as needed for moderate pain.   LEVOTHYROXINE (SYNTHROID, LEVOTHROID) 125 MCG TABLET    Take 1 tablet (125 mcg total) by mouth daily.   LORAZEPAM (ATIVAN) 1 MG TABLET    Take 0.5-1 tablets (0.5-1 mg total) by mouth 3 (three) times daily as needed for anxiety.   METOPROLOL SUCCINATE (TOPROL-XL) 50 MG 24 HR TABLET    Take 1 tablet (50 mg total) by mouth daily. Take with or immediately following a meal.   POTASSIUM CHLORIDE SA (K-DUR,KLOR-CON) 20 MEQ TABLET    Take 2 tablets (40 mEq total) by mouth daily.   SPIRONOLACTONE (ALDACTONE) 25 MG TABLET    TAKE 1 TABLET EVERY DAY   SYMBICORT 160-4.5 MCG/ACT INHALER    INHALE 2 PUFFS INTO THE LUNGS DAILY.   TIOTROPIUM BROMIDE MONOHYDRATE (SPIRIVA RESPIMAT) 2.5 MCG/ACT AERS    Inhale 2 puffs into the lungs daily.   TORSEMIDE (DEMADEX) 20 MG TABLET    TAKE 1 TABLET (20 MG TOTAL) BY MOUTH DAILY.   TRAZODONE (DESYREL) 50 MG TABLET    Take 1/2-1 tablet by mouth in evening as needed for sleep   VALSARTAN (DIOVAN) 40 MG TABLET    TAKE 1 TABLET (40 MG TOTAL) BY MOUTH 2 (TWO) TIMES DAILY.  Modified Medications   Modified Medication Previous Medication   ALBUTEROL (VENTOLIN HFA) 108 (90 BASE) MCG/ACT INHALER VENTOLIN HFA 108 (90 Base) MCG/ACT inhaler      Inhale 1-2 puffs into the lungs every 6 (six) hours as needed for wheezing or shortness of breath.    INHALE 2 PUFFS INTO THE LUNGS AS NEEDED FOR WHEEZING OR SHORTNESS OF BREATH.  Discontinued Medications   AMOXICILLIN-CLAVULANATE (AUGMENTIN) 875-125 MG TABLET    Take 1 tablet by mouth 2 (two) times daily.   CHLORPHENIRAMINE-HYDROCODONE  (TUSSIONEX PENNKINETIC ER) 10-8 MG/5ML SUER    Take 5 mLs by mouth at bedtime as needed.  GABAPENTIN (NEURONTIN) 100 MG CAPSULE    Take 2 capsules (200 mg total) by mouth at bedtime.   MECLIZINE (ANTIVERT) 25 MG TABLET    Take 25 mg by mouth 3 (three) times daily as needed for dizziness.   MELOXICAM (MOBIC) 15 MG TABLET    Take 15 mg by mouth daily as needed for pain.   NORETHINDRONE-ETHINYL ESTRADIOL 1/35 (ALAYCEN 1/35) TABLET    Take 1 tablet by mouth daily.   PREDNISONE (DELTASONE) 20 MG TABLET    Take 1 tablet (20 mg total) by mouth 2 (two) times daily with a meal.   TORSEMIDE (DEMADEX) 20 MG TABLET    Take 1 tablet (20 mg total) by mouth daily.

## 2016-02-15 NOTE — Patient Instructions (Signed)
Today we updated your med list in our EPIC system...    Continue your current medications the same...  We wrote a prescription for AUGMENTIN 875mg  one tab twice daily til gone for upper resp infection...  Let's get on track w/ our DIET & EXERCISE program...  Call for any questions...  Let's plan a follow up visit in 52mo, sooner if needed for breathing problems.Marland KitchenMarland Kitchen

## 2016-02-17 ENCOUNTER — Encounter (HOSPITAL_COMMUNITY): Payer: Self-pay

## 2016-02-20 ENCOUNTER — Encounter (HOSPITAL_COMMUNITY): Payer: Self-pay

## 2016-02-22 ENCOUNTER — Encounter (HOSPITAL_COMMUNITY): Payer: Self-pay

## 2016-02-24 ENCOUNTER — Encounter (HOSPITAL_COMMUNITY)
Admission: RE | Admit: 2016-02-24 | Discharge: 2016-02-24 | Disposition: A | Discharge status: Home / Self Care | Payer: Self-pay | Source: Ambulatory Visit | Attending: Cardiology | Admitting: Cardiology

## 2016-02-27 ENCOUNTER — Encounter (HOSPITAL_COMMUNITY): Payer: Self-pay

## 2016-02-29 ENCOUNTER — Encounter (HOSPITAL_COMMUNITY): Payer: Self-pay

## 2016-03-02 ENCOUNTER — Encounter (HOSPITAL_COMMUNITY): Payer: Self-pay

## 2016-03-05 ENCOUNTER — Encounter (HOSPITAL_COMMUNITY): Payer: Self-pay

## 2016-03-07 ENCOUNTER — Encounter (HOSPITAL_COMMUNITY)
Admission: RE | Admit: 2016-03-07 | Discharge: 2016-03-07 | Disposition: A | Discharge status: Home / Self Care | Payer: Self-pay | Source: Ambulatory Visit | Attending: Cardiology | Admitting: Cardiology

## 2016-03-07 DIAGNOSIS — I5042 Chronic combined systolic (congestive) and diastolic (congestive) heart failure: Secondary | ICD-10-CM | POA: Insufficient documentation

## 2016-03-09 ENCOUNTER — Encounter (HOSPITAL_COMMUNITY): Payer: Self-pay

## 2016-03-12 ENCOUNTER — Telehealth: Payer: Self-pay | Admitting: Pulmonary Disease

## 2016-03-12 ENCOUNTER — Encounter (HOSPITAL_COMMUNITY): Payer: Self-pay

## 2016-03-12 MED ORDER — BUDESONIDE-FORMOTEROL FUMARATE 160-4.5 MCG/ACT IN AERO: INHALATION_SPRAY | RESPIRATORY_TRACT | 11 refills | Status: DC

## 2016-03-12 NOTE — Telephone Encounter (Signed)
Called and spoke to pt. Pt is requesting a refill of Symbicort. Rx sent to preferred pharmacy. Pt verbalized understanding and denied any further questions or concerns at this time.

## 2016-03-14 ENCOUNTER — Encounter (HOSPITAL_COMMUNITY)
Admission: RE | Admit: 2016-03-14 | Discharge: 2016-03-14 | Disposition: A | Discharge status: Home / Self Care | Payer: Self-pay | Source: Ambulatory Visit | Attending: Cardiology | Admitting: Cardiology

## 2016-03-16 ENCOUNTER — Encounter (HOSPITAL_COMMUNITY): Payer: Self-pay

## 2016-03-16 ENCOUNTER — Ambulatory Visit (HOSPITAL_COMMUNITY)
Admission: RE | Admit: 2016-03-16 | Discharge: 2016-03-16 | Disposition: A | Discharge status: Home / Self Care | Payer: Commercial Managed Care - PPO | Source: Ambulatory Visit | Attending: Internal Medicine | Admitting: Internal Medicine

## 2016-03-16 ENCOUNTER — Ambulatory Visit (HOSPITAL_BASED_OUTPATIENT_CLINIC_OR_DEPARTMENT_OTHER)
Admission: RE | Admit: 2016-03-16 | Discharge: 2016-03-16 | Disposition: A | Discharge status: Home / Self Care | Payer: Commercial Managed Care - PPO | Source: Ambulatory Visit | Attending: Cardiology | Admitting: Cardiology

## 2016-03-16 VITALS — BP 128/80 | HR 94 | Resp 20 | Wt 197.5 lb

## 2016-03-16 DIAGNOSIS — I5022 Chronic systolic (congestive) heart failure: Secondary | ICD-10-CM | POA: Insufficient documentation

## 2016-03-16 DIAGNOSIS — D72829 Elevated white blood cell count, unspecified: Secondary | ICD-10-CM | POA: Insufficient documentation

## 2016-03-16 DIAGNOSIS — F329 Major depressive disorder, single episode, unspecified: Secondary | ICD-10-CM | POA: Insufficient documentation

## 2016-03-16 DIAGNOSIS — Z8249 Family history of ischemic heart disease and other diseases of the circulatory system: Secondary | ICD-10-CM | POA: Insufficient documentation

## 2016-03-16 DIAGNOSIS — I5042 Chronic combined systolic (congestive) and diastolic (congestive) heart failure: Secondary | ICD-10-CM

## 2016-03-16 DIAGNOSIS — Z79899 Other long term (current) drug therapy: Secondary | ICD-10-CM | POA: Insufficient documentation

## 2016-03-16 DIAGNOSIS — E039 Hypothyroidism, unspecified: Secondary | ICD-10-CM | POA: Diagnosis not present

## 2016-03-16 DIAGNOSIS — E6609 Other obesity due to excess calories: Secondary | ICD-10-CM

## 2016-03-16 DIAGNOSIS — I429 Cardiomyopathy, unspecified: Secondary | ICD-10-CM | POA: Diagnosis present

## 2016-03-16 DIAGNOSIS — I10 Essential (primary) hypertension: Secondary | ICD-10-CM | POA: Diagnosis not present

## 2016-03-16 DIAGNOSIS — G4733 Obstructive sleep apnea (adult) (pediatric): Secondary | ICD-10-CM | POA: Insufficient documentation

## 2016-03-16 DIAGNOSIS — IMO0001 Reserved for inherently not codable concepts without codable children: Secondary | ICD-10-CM

## 2016-03-16 DIAGNOSIS — J45909 Unspecified asthma, uncomplicated: Secondary | ICD-10-CM | POA: Diagnosis not present

## 2016-03-16 DIAGNOSIS — I110 Hypertensive heart disease with heart failure: Secondary | ICD-10-CM | POA: Diagnosis not present

## 2016-03-16 DIAGNOSIS — Z6837 Body mass index (BMI) 37.0-37.9, adult: Secondary | ICD-10-CM

## 2016-03-16 DIAGNOSIS — R5381 Other malaise: Secondary | ICD-10-CM | POA: Diagnosis not present

## 2016-03-16 DIAGNOSIS — R5382 Chronic fatigue, unspecified: Secondary | ICD-10-CM

## 2016-03-16 DIAGNOSIS — R5383 Other fatigue: Secondary | ICD-10-CM | POA: Insufficient documentation

## 2016-03-16 DIAGNOSIS — I428 Other cardiomyopathies: Secondary | ICD-10-CM

## 2016-03-16 LAB — TSH: TSH: 0.304 u[IU]/mL — ABNORMAL LOW (ref 0.350–4.500)

## 2016-03-16 LAB — BASIC METABOLIC PANEL
Anion gap: 10 (ref 5–15)
BUN: 12 mg/dL (ref 6–20)
CALCIUM: 9.2 mg/dL (ref 8.9–10.3)
CO2: 29 mmol/L (ref 22–32)
CREATININE: 0.89 mg/dL (ref 0.44–1.00)
Chloride: 99 mmol/L — ABNORMAL LOW (ref 101–111)
GFR calc non Af Amer: >60 mL/min (ref >60)
Glucose, Bld: 104 mg/dL — ABNORMAL HIGH (ref 65–99)
Potassium: 3.5 mmol/L (ref 3.5–5.1)
SODIUM: 138 mmol/L (ref 135–145)

## 2016-03-16 LAB — CBC
HCT: 42.5 % (ref 36.0–46.0)
Hemoglobin: 14 g/dL (ref 12.0–15.0)
MCH: 29.9 pg (ref 26.0–34.0)
MCHC: 32.9 g/dL (ref 30.0–36.0)
MCV: 90.8 fL (ref 78.0–100.0)
PLATELETS: 478 10*3/uL — AB (ref 150–400)
RBC: 4.68 MIL/uL (ref 3.87–5.11)
RDW: 13.7 % (ref 11.5–15.5)
WBC: 18.3 10*3/uL — AB (ref 4.0–10.5)

## 2016-03-16 LAB — BRAIN NATRIURETIC PEPTIDE: B NATRIURETIC PEPTIDE 5: 31.9 pg/mL (ref 0.0–100.0)

## 2016-03-16 MED ORDER — POTASSIUM CHLORIDE CRYS ER 20 MEQ PO TBCR: 40.0000 meq | EXTENDED_RELEASE_TABLET | Freq: Every day | ORAL | 3 refills | Status: DC

## 2016-03-16 MED ORDER — TORSEMIDE 20 MG PO TABS: 40.0000 mg | ORAL_TABLET | Freq: Every day | ORAL | 3 refills | Status: DC

## 2016-03-16 NOTE — Progress Notes (Signed)
  Echocardiogram 2D Echocardiogram has been performed.  Meredith Diaz 03/16/2016, 1:39 PM

## 2016-03-16 NOTE — Progress Notes (Signed)
Patient ID: Meredith Diaz, female   DOB: 06/19/1980, 35 y.o.   MRN: [id number]     Advanced Heart Failure Clinic Note   PCP: Dr. Okey Dupre HF Cardiology: Dr. Hart Robinsons CHYSTAL GIRTY is a 35 y.o. female with a hx of nonischemic cardiomyopathy.  This was diagnosed in 2013.  EF was initially 25-30%.  Cardiac cath at that time showed no significant CAD.  Cardiac MRI showed no late gadoliniuim enhancement.  Since that time, EF has remained low but has improved a bit to 40-45%.  Most recent echo in 7/16 showed EF 40-45% with diffuse hypokinesis.  She also has asthma.  PFTs showed a severe restrictive defect that may be due to body habitus primarily.   She was admitted in 8/16 with chest pain.  CTA chest showed no PE, lung parenchyma looked normal.  She had ETT with poor exercise tolerance but no evidence for ischemia.  CPX was done in 12/16, showing moderate functional limitation due primarily to lung restriction.   She presents today for regular follow up. States she hasn't been feeling as well over the past few months. Both of her grandparents died suddenly and "set her back". She is up 8 lbs from visit in 08/2015. Feels like she has been peeing less on torsemide, taking 40 mg daily more frequently.  Feels more SOB, even with mild/mod exertion like walking up stairs or into the grocery store from the car.  No SOB with ADLs such as bathing or dressing.  Does occasionally get SOB on flat ground with medium distance (about a block), up an incline, or if she gets in a hurry. No orthopnea. Occasional nighttime awakenings with SOB, but also has Asthma. In Cardiac rehab maintenance program. Occasional lightheadedness, but not during exercise. Fatigues easily, weather exacerbates her symptoms.     Echo today reviewed personally by Dr. Shirlee Latch. EF 45-50%, normal RV  Labs (9/16): K 3.2, creatinine 1.11, hgb 14.3 Labs (12/16): K 3.3, creatinine 0.96, BNP 23 Labs (2/17): K 4.7, creatinine 0.95, TSH normal Labs (8/17):  K 3.9, creatinine 0.93  PMH: 1. Chronic systolic CHF: Nonischemic cardiomyopathy.  - EF 25-30% by echo in 2013.  - Cardiac MRI (2013) with EF 52%, no delayed enhancement.  - LHC in 2013 without significant CAD. - 11/15 echo with EF 40-45%.   - 7/16 echo with EF 40-45%, grade II diastolic dysfunction.  - ETT (6/94) with poor exercise tolerance but no ischemia.  - CPX (12/16): peak VO2 15.7 (23.5 adjusted for ideal body weight), VE/VCO2 slope 21, RER 1.15 => moderate functional limitation thought to be primarily due to restrictive lung physiology.  - Echo (11/17): EF 45-50%, mild diffuse hypokinesis, normal RV size and systolic function.  2. Asthma: PFTs (4/16) with mild obstruction, severe restriction and severely decreased DLCO => think primarily due to body habitus.  3. Depression 4. Hypothyroidism 5. 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  6. Angioedema with ACEI 7. OSA: CPAP.  8. Chronic leukocytosis: Negative workup.  9. BPPV  SH: Lives in [place], no smoking, rare ETOH, works as Programmer, multimedia.   FH: Father with CAD, grandfather and grandmother with MIs.  No cardiomyopathy.   ROS: All systems reviewed and negative except as per HPI.   Current Outpatient Prescriptions  Medication Sig Dispense Refill  . albuterol (VENTOLIN HFA) 108 (90 Base) MCG/ACT inhaler Inhale 1-2 puffs into the lungs every 6 (six) hours as needed for wheezing or shortness of breath. 18 Inhaler 11  . budesonide-formoterol (  SYMBICORT) 160-4.5 MCG/ACT inhaler INHALE 2 PUFFS INTO THE LUNGS TWICE DAILY 10.2 Inhaler 11  . calcium carbonate (TUMS - DOSED IN MG ELEMENTAL CALCIUM) 500 MG chewable tablet Chew 1 tablet by mouth daily as needed for indigestion or heartburn. Reported on 06/15/2015    . Cholecalciferol (VITAMIN D) 2000 units CAPS Take by mouth daily.    . cholestyramine light (PREVALITE) 4 GM/DOSE powder Take 4 g by mouth as needed (DIARRHEA DUE TO BILE ACIDS).     . clobetasol cream (TEMOVATE)  0.05 % Apply 1 application topically as needed (for Sclerosis).     . clonazePAM (KLONOPIN) 1 MG tablet Take 1 tablet (1 mg total) by mouth 2 (two) times daily. (Patient taking differently: Take 1 mg by mouth daily. ) 60 tablet 5  . dicyclomine (BENTYL) 20 MG tablet Take 1 tablet (20 mg total) by mouth as needed for spasms. 60 tablet 3  . DULoxetine (CYMBALTA) 60 MG capsule TAKE 1 CAPSULE (60 MG TOTAL) BY MOUTH DAILY. 30 capsule 6  . ibuprofen (ADVIL,MOTRIN) 200 MG tablet Take 400 mg by mouth every 6 (six) hours as needed for moderate pain.    [name]. levothyroxine (SYNTHROID, LEVOTHROID) 125 MCG tablet Take 1 tablet (125 mcg total) by mouth daily. 90 tablet 3  . LORazepam (ATIVAN) 1 MG tablet Take 0.5-1 tablets (0.5-1 mg total) by mouth 3 (three) times daily as needed for anxiety. 90 tablet 0  . metoprolol succinate (TOPROL-XL) 50 MG 24 hr tablet Take 1 tablet (50 mg total) by mouth daily. Take with or immediately following a meal. 90 tablet 3  . potassium chloride SA (K-DUR,KLOR-CON) 20 MEQ tablet Take 20 mEq by mouth daily.    [name]. spironolactone (ALDACTONE) 25 MG tablet TAKE 1 TABLET EVERY DAY 30 tablet 6  . Tiotropium Bromide Monohydrate (SPIRIVA RESPIMAT) 2.5 MCG/ACT AERS Inhale 2 puffs into the lungs daily. 4 g 4  . torsemide (DEMADEX) 20 MG tablet TAKE 1 TABLET (20 MG TOTAL) BY MOUTH DAILY. 30 tablet 3  . traZODone (DESYREL) 50 MG tablet Take 1/2-1 tablet by mouth in evening as needed for sleep 30 tablet 5  . valsartan (DIOVAN) 40 MG tablet TAKE 1 TABLET (40 MG TOTAL) BY MOUTH 2 (TWO) TIMES DAILY. 60 tablet 6   No current facility-administered medications for this encounter.    BP 128/80 (BP Location: Left Arm, Patient Position: Sitting, Cuff Size: Normal)   Pulse 94   Resp 20   Wt 197 lb 8 oz (89.6 kg)   SpO2 92%   BMI 38.57 kg/m    Wt Readings from Last 3 Encounters:  03/16/16 197 lb 8 oz (89.6 kg)  02/15/16 194 lb 2 oz (88.1 kg)  01/13/16 190 lb (86.2 kg)     General: NAD Neck: No  JVD, no thyromegaly or thyroid nodule.  Lungs: Clear to auscultation bilaterally with normal respiratory effort. CV: Nondisplaced PMI.  Heart regular S1/S2, no S3/S4, no murmur.  No peripheral edema.  No carotid bruit.  Normal pedal pulses.  Abdomen: Soft, nontender, no hepatosplenomegaly, no distention.  Skin: Intact without lesions or rashes.  Neurologic: Alert and oriented x 3.  Psych: Normal affect. Extremities: No clubbing or cyanosis.  HEENT: Normal.   Assessment/Plan: 1. Chronic systolic CHF: Nonischemic cardiomyopathy.  No CAD on prior cath, prior cMRI showed no late gadolinium enhancement.  No family history of cardiomyopathy.  No ETOH/substance abuse.  No uncontrolled HTN.  Possible that she had viral myocarditis. CPX showed moderate functional limitation but  this seems to be primarily due to lung restriction (probably from body habitus).  NYHA class III symptoms. Echo today reviewed personally by Dr. Shirlee Latch. EF 45-50%, normal RV. She is mildly volume overloaded on exam, torsemide decreased at last appointment.  - Increase torsemide to 40 mg daily and potassium to 40 meq daily. BMET/BNP today and repeat BMET 10 days.  - Continue Toprol XL 50 mg daily.  - Continue spironolactone 25 daily, BMET today.   - Continue valsartan 40 mg bid.    2. Asthma: No active wheezing on exam.  3. Restrictive PFTs: Suspect this is primarily due to body habitus.   4. Obesity: - Continue to watch portions and increase activity as able - Continue cardiac rehab maintenance/nutrition  5. Fatigue: Out of proportion to her HF. Pt noted to have thyroid disease with recent adjustment of meds as well as seeing heme for leukocytosis of unknown origin.   - Check TSH and CBC today as well. - Will refer to Rheum.   Meds and Labs as above. Follow up 2 months.   Graciella Freer, PA-C 03/16/2016   Patient seen with PA, agree with the above note.  Echo reviewed, EF remains in the 45-50% range.  She is more  short of breath and fatigued recently.  We decreased her torsemide at last appointment.  Probably has mild volume overload, but symptoms seem out of proportion to CHF.  - Increase torsemide to 40 daily with KCl 40 daily.  BMET 10 days.   Will check TSH, CBC.  With multiple symptoms, long-standing leukocytosis, will refer her to rheumatology.    Marca Ancona 03/18/2016

## 2016-03-16 NOTE — Patient Instructions (Signed)
INCREASE Torsemide to 40 mg (2 tabs) once daily.  INCREASE Potassium to 40 meq (2 tabs) once daily.  Routine lab work today. Will notify you of abnormal results, otherwise no news is good news!  Return in 10 days for repeat labs.  Will refer you to [facility] with Dr. Alben Deeds for general Malaise/fatigue and increased white blood cell count of unknown origin. [address] [place], [place] [address] Phone [phone] Fax [phone]  Follow up 2 months with Dr. Shirlee Latch.  Do the following things EVERYDAY: 1) Weigh yourself in the morning before breakfast. Write it down and keep it in a log. 2) Take your medicines as prescribed 3) Eat low salt foods-Limit salt (sodium) to 2000 mg per day.  4) Stay as active as you can everyday 5) Limit all fluids for the day to less than 2 liters

## 2016-03-19 ENCOUNTER — Encounter (HOSPITAL_COMMUNITY): Payer: Self-pay

## 2016-03-19 ENCOUNTER — Telehealth (HOSPITAL_COMMUNITY): Payer: Self-pay | Admitting: *Deleted

## 2016-03-19 NOTE — Telephone Encounter (Signed)
Notes Recorded by Skip Litke Q Valley Ke, CMA on 03/19/2016 at 2:40 PM EST Patient aware. Labs forwarded to Dr.Crawford.   ------  Notes Recorded by Dalton S McLean, MD on 03/18/2016 at 1:27 PM EST TSH suppressed, looks like too much thyroid med. Please call patient and forward this to whoever treats her thyroid.    Ref Range & Units 3d ago 96moVWGrRock RAdvanBruna PChristus Schumpert Medical CenterJuanelClifton61 65.7Delton CooPatty SMaryclareRedDaLarita FifeTexas Scottish Rite Hospit166DixBenedettUnk Lightni71mov432 [phone] ClCraige Co75tDca Diagnostics LLKentuckytaElElease HashimotTereSt Marys Health CareDarnel36mPhoeb54mo VWGrCoalAdvanBruna PGenesis Medical Center West-DavenportJuanelClifton51 65.7Delton CooPatty SMaryclareRedDaLarita FifeMinor And Jam16234DixBenedettUnk [phone] Clo1BreiniCraige Co48tEncompass Health Rehabilitation Hospital Of CharlestonKentuckytaElElease HashimotTereMulberry Ambulatory Surgical CenDarnel57mSpecialty Surgery86moCVWGrPort AdvanBruna PThe Surgery Center At Edgeworth CommonsJuanelClifton77 65.7Delton CooPatty SMaryclareRedDaLarita FifeCarolina Mountain Gastroenterology Endos16787DixBenedettUnk LightniShingle [phone] Clo6SanCraige Co70tThe Emory Clinic IncKentuckytaElElease HashimotTereWauwatosa Surgery Center Limited Partnership Dba Wauwatosa SurgeryDarnel81mEndoscopy C8monVWGrSAdvanBruna PEmanuel Medical CenterJuanelClifton3 65.7Delton CooPatty SMaryclareRedDaLarita FifeSouthwell Medical, A16142DixBenedettUnk LightniPort [phone](13Mariea Clo2LCraige Co45tSaint Lukes Surgery Center Shoal CreekKentuckytaElElease HashimotTereNew York Presbyterian Hospital - Westchester DDarnel35mS76morVWGrAdvanBruna PVa Central Alabama Healthcare System - MontgomeryJuanelClifton17 65.7Delton CooPatty SMaryclareRedDaLarita FifeAscension St Mi1624DixBenedettUnk LightniEl54moa956 [phone] Clo3SeatCraige Co18tAurora Psychiatric HsptlKentuckytaElElease HashimotTereCheyenne Va MedicalDarnel14mEast Texas Medic63molVWGrBAdvanBruna PBeaufort Memorial HospitalJuanelClifton66 65.7Delton CooPatty SMaryclareRedDaLarita FifeMary Wash165DixBenedettUnk [phone] CloCraige Co26tSt Vincent Seton Specialty Hospital LafayetteKentuckytaElElease HashimotTereSedgwick County Memorial HDarne68mo3VWGrWAdvanBruna PDickenson Community Hospital And Green Oak Behavioral HealthJuanelClifton44 65.7Delton CooPatty SMaryclareRedDaLarita FifeCharles A. Cannon, Jr. Me16613DixBenedettUnk LightniVilla Sin [phone](679Mariea Clo7BCraige Co97tCentra Specialty HospitalKentuckytaElElease HashimotTereEndosurg Outpatient CenDarn23molVWGrHephAdvanBruna PCornerstone Speciality Hospital - Medical CenterJuanelClifton41 65.7Delton CooPatty SMaryclareRedDaLarita FifeDodge 162DixBenedettUnk [phone] Clo1OwCraige Co26tMidlands Orthopaedics Surgery CenterKentuckytaElElease HashimotTereSpecialty Surgery LaserDarnel46mLovela31modVWGrStaAdvanBruna PRoanoke Valley Center For Sight LLCJuanelClifton77 65.7Delton CooPatty SMaryclareRedDaLarita FifeEast Houston R162DixBenedettUnk [phone] Clo6South PalCraige Co70tCarolina Surgical CenterKentuckytaElElease HashimotTereSitka Community HDarnel89mBaptist M31modVWGrNormandyAdvanBruna PMaui Memorial Medical CenterJuanelClifton71 65.7Delton CooPatty SMaryclareRedDaLarita FifeSuncoast Endoscopy 1619(34DixBenedettUnk LightniSouth Bro2mon931 [phone] Clo7BrantlCraige Co7tChinle Comprehensive Health Care FacilityKentuckytaElElease HashimotTereNorth Florida Gi Center Dba North Florida EndoscopyDarnel51mChristus Mother France19mo VWGrBlueAdvanBruna PAmarillo Endoscopy CenterJuanelClifton78 65.7Delton CooPatty SMaryclareRedDaLarita FifeCommunity Hospi1639(94DixBenedettUnk LightniApache [phone](344Mariea CCraige Co32tCone HealthKentuckytaElElease HashimotTereRocky Mountain Laser And SurgeryDarne68mo2VWGrClatsAdvanBruna PCommunity Memorial HospitalJuanelClifton17 65.7Delton CooPatty SMaryclareRedDaLarita FifeSurgical S167DixBenedettUnk LightniDenham [phone] Clo2Craige Co104tSummit Surgery Center LLKentuckytaElElease HashimotTereBhc Fairfax HospitaDarnel47mIdaho Physical Medicine15moAVWGrPort ClaAdvanBruna PAleda E. Lutz Va Medical CenterJuanelClifton6 65.7Delton CooPatty SMaryclareRedDaLarita FifeRogers Mem Hos1647DixBenedettUnk [phone] Clo5MayfieldCraige Co62tPerkins County Health ServicesKentuckytaElElease HashimotTereEye Surgery And LaserDarnel74mJohns Hopkins29moSVWGrRiver AdvanBruna PJefferson Ambulatory Surgery Center LLCJuanelClifton35 65.7Delton CooPatty SMaryclareRedDaLarita FifePam Rehabilitation Hospi16224DixBenedettUnk LightniSaddle [phone] CloCraige Co14tCoastal Behavioral HealthKentuckytaElElease HashimotTereJohnson County HealthDarnel61mKindred11moHVWGrLakAdvanBruna PSouth Beach Psychiatric CenterJuanelClifton55 65.7Delton CooPatty SMaryclareRedDaLarita FifeParkview Hunt16223DixBenedettUnk Lightni71moe564 [phone] CloCraige Co54tRenue Surgery CenterKentuckytaElElease HashimotTereNorth Mississippi Medical Center - HDarnel59mLafayette Gen53morVWGrGracAdvanBruna PNorth Georgia Eye Surgery CenterJuanelClifton21 65.7Delton CooPatty SMaryclareRedDaLarita FifeVa Medical Cent16427DixBenedettUnk [phone] CloMonCraige Co87tCumberland Hospital For Children And AdolescentsKentuckytaElElease HashimotTereAmbulatory Surgery Center Of OpDarnel71mUnity58moHVWGrWakeAdvanBruna PAlliancehealth MidwestJuanelClifton59 65.7Delton CooPatty SMaryclareRedDaLarita FifeSaint Thomas 16607DixBenedettUnk [phone] Clo1Craige Co70tLakes Region General HospitalKentuckytaElElease HashimotTereLos Angeles Metropolitan MedicalDarnel74mLourdes Medical Cente34moGrMaAdvanBruna PRehabiliation Hospital Of Overland ParkJuanelClifton21 65Patty SMaryclaDaLarita FifeMidtown MeDixBenede26motVWGrOccidAdvanBruna PMhp Medical CenterJuanelClifton35 65.7Delton CooPatty SMaryclareRedDaLarita FifeLifecare Hospitals Of1684(73DixBenedettUnk Lightn5Craige Cot22tElease 4moaVWGrTilghmAdvanBruna PAccord Rehabilitaion HospitalJuanelClifton68 65.7Delton CooPatty SMaryclareRedDaLarita FifeWenatchee Valley Hospital Dba Confluence Health16593DixBenedettUnk [phone] Clo2RCraige Co30tBaptist Health Surgery CenterKentuckytaElElease HashimotTereSilver Spring Surgery CenDarnel77mSa30mo VWGrAdvanBruna PAtlanta General And Bariatric Surgery Centere LLCJuanelClifton4 65.7Delton CooPatty SMaryclareRedDaLarita FifeThe Surgery Center Of T1646(77DixBenedettUnk LightniSan [phone] Clo5Craige Co63tKarmanos Cancer CenterKentuckytaElElease HashimotTereSanford Health Sanford Clinic Watertown SurgiDarnel29mGundersen Boscobel Are61mo VWGrWilAdvanBruna PSelect Specialty Hospital - PontiacJuanelClifton63 65.7Delton CooPatty SMaryclareRedDaLarita FifeWiregrass1679DixBenedettUnk [phone] CloCraige Co77tHss Asc Of Manhattan Dba Hospital For Special SurgeryKentuckytaElElease HashimotTereCrossroads Community HDarnel17mC74moaVWGrBell CAdvanBruna PNorth Hills Surgicare LPJuanelClifton32 65.7Delton CooPatty SMaryclareRedDaLarita FifeMercy Hosp1639(3DixBenedettUnk [phone] Clo8TaCraige Co80tNea Baptist Memorial HealthKentuckytaElElease HashimotTereSt. John'S Episcopal Hospital-SoutDarnel27mPre51moiVWGAdvanBruna PGreater Peoria Specialty Hospital LLC - Dba Kindred Hospital PeoriaJuanelClifton65 65.7Delton CooPatty SMaryclareRedDaLarita FifeFry Eye Sur16617DixBenedettUnk [phone] CloCraige Co24tSen[address] Surgery Center LLC Iu HealthKentuckytaElElease HashimotTereEdwardsville Ambulatory Surgery CenDarnel47mSe55motVWGrJasmine EsAdvanBruna PSaint Lukes Surgery Center Shoal CreekJuanelClifton59 65.7Delton CooPatty SMaryclareRedDaLarita FifeSelect Specialty Hospital 16892DixBenedettUnk [phone] ClCraige Co[address]tMer[address] Charles HospitalKentuckytaElElease HashimotTereSouthwest Idaho Surgery CenDarnel32mWashingto81mo VWGrAdvanBruna PRiver Valley Behavioral HealthJuanelClifton40 65.7Delton CooPatty SMaryclareRedDaLarita FifeArkansas Children'S16412DixBenedettUnk [phone] Clo5MoCraige Co33tCenter For Urologic SurgeryKentuckytaElElease HashimotTereBayhealth Kent General HDarnel15mB43mooVWGrWAdvanBruna PSt. Luke'S Regional Medical CenterJuanelClifton76 65.7Delton CooPatty SMaryclareRedDaLarita FifeSurgery Center1669(73DixBenedettUnk [phone] Clo5WesCraige Co78tWestern Pa Surgery Center Wexford Branch LLKentuckytaElElease HashimotTereMadelia Community HDarnel29mWellspan Surgery And R74mohVWGrPeterAdvanBruna PMile Square Surgery Center IncJuanelClifton28 65.7Delton CooPatty SMaryclareRedDaLarita FifeSpark M. Matsunaga Va1655(3DixBenedettUnk [phone] Clo5CCraige Co60tHavasu Regional Medical CenterKentuckytaElElease HashimotTereAlbany Area Hospital & Darnel24mPasadena Adva76mocVWGrAAdvanBruna PFairview Northland Reg HospJuanelClifton75 65.7Delton CooPatty SMaryclareRedDaLarita FifeHighlands Behaviora16322DixBenedettUnk [phone] Clo7CedaCraige Co79tBronx-Lebanon Hospital Center - Fulton DivisionKentuckytaElElease HashimotTereSt. Francis MedicalDarnel77mo8VWGrStouAdvanBruna PMontgomery County Emergency ServiceJuanelClifton49 65.7Delton CooPatty SMaryclareRedDaLarita FifeFairlawn Rehabili16605DixBenedettUnk [phone] Clo6Moss Craige Co64tVictoria Ambulatory Surgery Center Dba The Surgery CenterKentuckytaElElease HashimotTereUhhs Memorial Hospital OfDarnel38mTexas Health Resource Presto30mo VWGrRiveAdvanBruna PWestern Regional Medical Center Cancer HospitalJuanelClifton75 65.7Delton CooPatty SMaryclareRedDaLarita FifeBeverly Hills Doctor 1622DixBenedettUn22mo VWGrCaAdvanBruna PNorthridge Surgery CenterJuanelClifton80 65.7Delton CooPatty SMaryclareRedDaLarita FifeSioux Falls Veterans Affairs1629(31DixBenedettUnk [phone] ClCraige Co36tSeattle Va Medical Center ([facility])KentuckytaElElease HashimotTereTranssouth Health Care Pc Dba Ddc SurgeryDarnel27mChristiana C89morVWGrIndian ViAdvanBruna PWasatch Front Surgery Center LLCJuanelClifton85 65.7Delton CooPatty SMaryclareRedDaLarita FifeThe Hand And Upper Extremity Surgery Center1612(8DixBenedettUnk [phone] CloCraige Co57tVilla Feliciana Medical ComplexKentuckytaElElease HashimotTereEndoscopy Center Of Colorado SpriDarnel64mTexas Health Heart & Vascu105moaVWGrMAdvanBruna PGerald Champion Regional Medical CenterJuanelClifton29 65.7Delton CooPatty SMaryclareRedDaLarita FifeCharlotte Endoscopic Surgery Center LLC Dba Charlotte Endoscopic16278DixBenedettUnk LightniBurnt [phone] Clo5PCraige Co27tPagosa Mountain HospitalKentuckytaElElease HashimotTereWestfall Surgery CenDarnel57mNorthland 46moyVWGrSaAdvanBruna PLower Bucks HospitalJuanelClifton58 65.7Delton CooPatty SMaryclareRedDaLarita FifePhoenix Beha16244DixBenedettUn1mo VWGrRollingAdvanBruna PRoosevelt Warm Springs Rehabilitation HospitalJuanelClifton74 65.7Delton CooPatty SMaryclareRedDaLarita FifeGarland Beha167DixBenedettUnk LightniVi59moc978 [phone](40Mariea CloCraige Co86tNorthside HospitalKentuckytaElElease HashimotTerePalos Community HDarnel63mChristus Cousha78motVWGrMAdvanBruna PTexas Health Huguley HospitalJuanelClifton59 65.7Delton CooPatty SMaryclareRedDaLarita FifeLongleaf1677(20DixBenedettUnk [phone] Clo1GuCraige Co71tMountrail County Medical CenterKentuckytaElElease HashimotTereSouthern Regional MedicalDarnel38mAlexian Brothers Beh86movVWGrSeminole AdvanBruna PCommunity Memorial HospitalJuanelClifton6 65.7Delton CooPatty SMaryclareRedDaLarita FifeKaiser Fnd Hosp 16109(6DixBenedettUnk LightniCre89moc548 [phone] CloNorth KansCraige Co47tNiagara Falls Memorial Medical CenterKentuckytaElElease HashimotTereCape Cod Eye Surgery And LaserDarnel2m76mobVWGAdvanBruna PAurora Las Encinas Hospital, LLCJuanelClifton73 65.7Delton CooPatty SMaryclareRedDaLarita FifeDelta16607DixBenedettUnk LightniRed Lake [phone](12Mariea CloCraige Co35tAmbulatory Surgery Center Group LtdKentuckytaElElease HashimotTereStuart Surgery CenDarnel79mCorvallis Clinic Pc Dba The Corvalli70moGrRosAdvanBruna PPacific Digestive Associates PcJuanelClifton21 65Patty SMaryclaDaLarita FifeH Lee Moffitt Cancer CtDixBenede5motVWGrBeAdvanBruna PFairview Ridges HospitalJuanelClifton34 65.7Delton CooPatty SMaryclareRedDaLarita FifeCarson Tahoe 16302DixBenedettUn19mo VWGrSabAdvanBruna PEncompass Health Rehabilitation Hospital Of FranklinJuanelClifton50 65.7Delton CooPatty SMaryclareRedDaLarita FifeKalispell Regional Medical Center Inc Dba Polson Health Ou1644(8DixBenedettUnk [phone] ClCraige Co3tLeesburg Regional Medical CenterKentuckytaElElease HashimotTereMiddle Park Medical CenterDarnel33mMeadow64morVWGrAlexaAdvanBruna PTexas Health Womens Specialty Surgery CenterJuanelClifton59 65.7Delton CooPatty SMaryclareRedDaLarita FifeWoodbr1641(9DixBenedettUnk [phone] CloGriffitCraige Co68tWhite County Medical Center - South CampusKentuckytaElElease HashimotTereRocky Mountain Laser And SurgeryDarnel48mo3VWGrEAdvanBruna PElite Medical CenterJuanelClifton68 65.7Delton CooPatty SMaryclareRedDaLarita FifeDale1644(51DixBenedettUn24mo VWGrEast DuAdvanBruna PFront Range Orthopedic Surgery Center LLCJuanelClifton37 65.7Delton CooPatty SMaryclareRedDaLarita FifeSpringhill Me16358DixBenedettUnk [phone] Clo1Craige Co27tRockford Digestive Health Endoscopy CenterKentuckytaElElease HashimotTereEndoscopic Ambulatory Specialty Center Of Bay RiDarnel22mGulf61moCVWGrNoAdvanBruna POceans Behavioral Healthcare Of LongviewJuanelClifton31 65.7Delton CooPatty SMaryclareRedDaLarita FifeBarnesville Hospital A16612DixBenedettUnk LightniSandy [phone] Clo3WCraige Co14tTomah Va Medical CenterKentuckytaElElease HashimotTereMary Washington HDar52moeVWGrBloAdvanBruna PCoryell Memorial HospitalJuanelClifton22 65.7Delton CooPatty SMaryclareRedDaLarita FifeAzusa Sur1669(8DixBenedettUnk [phone](15Mariea Craige Co73tVa Medical Center - Kansas CityKentuckytaElElease HashimotTereWomen And Children'S Hospital Of Darnel27mCanyon V60moeVWGrLeAdvanBruna PDominican Hospital-Santa Cruz/SoquelJuanelClifton32 65.7Delton CooPatty SMaryclareRedDaLarita FifeThe Surgery Center Dba Advance1658(9DixBenedettUnk LightniSt. [phone] Clo7WhitCraige Co57tKindred Hospital Arizona - PhoenixKentuckytaElElease HashimotTereSycamore Darnel4mRegency Hospital39moOVWGrLake IsaAdvanBruna PAurora St Lukes Med Ctr South ShoreJuanelClifton18 65.7Delton CooPatty SMaryclareRedDaLarita FifeDaniels Me16755DixBenedettUnk [phone](46Mariea CloCraige Co19tMcbride Orthopedic HospitalKentuckytaElElease HashimotTereUptown Healthcare ManagemDarnel63mPre25mobVWGAdvanBruna PPerry County General HospitalJuanelClifton89 65.7Delton CooPatty SMaryclareRedDaLarita FifeNorthern Colorado Rehabili165DixBenedettUnk LightniPoint [phone](59Mariea CloCraige Co63tSt Joseph'S Hospital Health CenterKentuckytaElElease HashimotTereCentral Coast Endoscopy CenDarnel37mCas39moaVWGrCoAdvanBruna PLake Charles Memorial Hospital For WomenJuanelClifton31 65.7Delton CooPatty SMaryclareRedDaLarita FifeFort Hamilton Hughes Me1661(57DixBenedettUnk [phone] Clo9WalCraige Co58tDelano Regional Medical CenterKentuckytaElElease HashimotTerePlastic And Reconstructive SDarnel72mThe Surgery Ce61motVWGrRock SpAdvanBruna PUpper Connecticut Valley HospitalJuanelClifton33 65.7Delton CooPatty SMaryclareRedDaLarita FifeSt Joseph'S Hosp16502DixBenedettUnk [phone] Clo6Craige Co57tCrossroads Surgery Center IncKentuckytaElElease HashimotTereBaptist Health RDarnel26mS19molVWGrAdAdvanBruna PBeaumont Hospital Farmington HillsJuanelClifton3 65.7Delton CooPatty SMaryclareRedDaLarita FifeKalispell Regional Medical Center Inc Dba Polson Health Ou16512DixBenedettUnk LightniCape [phone] Clo7MaplCraige Co12tSurgicare Surgical Associates Of Jersey City LLKentuckytaElElease HashimotTereBear River Valley HDarnel13mSelect Spec33moaVWGrMAdvanBruna PLewisgale Hospital AlleghanyJuanelClifton41 65.7Delton CooPatty SMaryclareRedDaLarita FifeWest Chester1642(7DixBenedettUn47mo VWGrHAdvanBruna PPender Community HospitalJuanelClifton42 65.7Delton CooPatty SMaryclareRedDaLarita FifeNew England Sur1670(78DixBenedettUnk LightniGarden [phone] Clo4Los ACraige Co8tGreenspring Surgery CenterKentuckytaElElease HashimotTereStone Oak SurgeryDarnel51mGila R32mogVWGrAdvanBruna PReagan Memorial HospitalJuanelClifton92 65.7Delton CooPatty SMaryclareRedDaLarita FifeKindred Hos16813DixBenedettUnk [phone](64Mariea Clo3ForesCraige Co62tAppling Healthcare SystemKentuckytaElElease HashimotTereKeller Army Community HDarnel14mFirst Coas20mo VWGrNew BaltAdvanBruna PVeterans Affairs New Jersey Health Care System East - Orange CampusJuanelClifton45 65.7Delton CooPatty SMaryclareRedDaLarita FifeNye Regional16182DixBenedettUnk [phone](25Mariea Clo2New SummCraige Co56tCentral Washington HospitalKentuckytaElElease HashimotTereThe Women'S Hospital At CenDarnel69mOrthopedic Specialty Hospi721Joycelyn Rua50 - 4.500 uIU/mL 0.304   4.78R   2.55R

## 2016-03-19 NOTE — Telephone Encounter (Signed)
-----   Message from Laurey Morale, MD sent at 03/18/2016  1:27 PM EST ----- TSH suppressed, looks like too much thyroid med.  Please call patient and forward this to whoever treats her thyroid.

## 2016-03-20 ENCOUNTER — Telehealth (HOSPITAL_COMMUNITY): Payer: Self-pay | Admitting: Vascular Surgery

## 2016-03-20 NOTE — Telephone Encounter (Signed)
Referral faxed to Dr. Dierdre Forth 03/20/16

## 2016-03-21 ENCOUNTER — Encounter (HOSPITAL_COMMUNITY): Payer: Self-pay

## 2016-03-23 ENCOUNTER — Encounter (HOSPITAL_COMMUNITY): Payer: Self-pay

## 2016-03-26 ENCOUNTER — Encounter (HOSPITAL_COMMUNITY): Payer: Self-pay

## 2016-03-26 ENCOUNTER — Ambulatory Visit (HOSPITAL_COMMUNITY)
Admission: RE | Admit: 2016-03-26 | Discharge: 2016-03-26 | Disposition: A | Discharge status: Home / Self Care | Payer: Commercial Managed Care - PPO | Source: Ambulatory Visit | Attending: Cardiology | Admitting: Cardiology

## 2016-03-26 ENCOUNTER — Other Ambulatory Visit (HOSPITAL_COMMUNITY): Payer: Self-pay | Admitting: Cardiology

## 2016-03-26 DIAGNOSIS — I5022 Chronic systolic (congestive) heart failure: Secondary | ICD-10-CM | POA: Insufficient documentation

## 2016-03-26 LAB — BASIC METABOLIC PANEL
ANION GAP: 15 (ref 5–15)
BUN: 24 mg/dL — ABNORMAL HIGH (ref 6–20)
CALCIUM: 9.6 mg/dL (ref 8.9–10.3)
CO2: 33 mmol/L — AB (ref 22–32)
CREATININE: 1.36 mg/dL — AB (ref 0.44–1.00)
Chloride: 90 mmol/L — ABNORMAL LOW (ref 101–111)
GFR calc Af Amer: 57 mL/min — ABNORMAL LOW (ref >60)
GFR, EST NON AFRICAN AMERICAN: 49 mL/min — AB (ref >60)
GLUCOSE: 130 mg/dL — AB (ref 65–99)
Potassium: 3.3 mmol/L — ABNORMAL LOW (ref 3.5–5.1)
Sodium: 138 mmol/L (ref 135–145)

## 2016-03-27 ENCOUNTER — Other Ambulatory Visit (HOSPITAL_COMMUNITY): Payer: Self-pay | Admitting: Cardiology

## 2016-03-28 ENCOUNTER — Encounter (HOSPITAL_COMMUNITY): Payer: Self-pay

## 2016-03-28 ENCOUNTER — Telehealth (HOSPITAL_COMMUNITY): Payer: Self-pay | Admitting: Cardiology

## 2016-03-28 DIAGNOSIS — I509 Heart failure, unspecified: Secondary | ICD-10-CM

## 2016-03-28 MED ORDER — TORSEMIDE 20 MG PO TABS: 40.0000 mg | ORAL_TABLET | Freq: Every day | ORAL | 3 refills | Status: DC

## 2016-03-28 NOTE — Telephone Encounter (Signed)
Patient aware. Repeat labs 11/29

## 2016-03-28 NOTE — Telephone Encounter (Signed)
-----   Message from Laurey Morale, MD sent at 03/27/2016  5:58 PM EST ----- Cut back torsemide to 20 mg daily alternating with 40 mg daily.  Repeat BMET in 1 week.

## 2016-04-02 ENCOUNTER — Ambulatory Visit: Payer: Commercial Managed Care - PPO | Admitting: Internal Medicine

## 2016-04-02 ENCOUNTER — Encounter (HOSPITAL_COMMUNITY)
Admission: RE | Admit: 2016-04-02 | Discharge: 2016-04-02 | Disposition: A | Discharge status: Home / Self Care | Payer: Self-pay | Source: Ambulatory Visit | Attending: Cardiology | Admitting: Cardiology

## 2016-04-03 ENCOUNTER — Ambulatory Visit (INDEPENDENT_AMBULATORY_CARE_PROVIDER_SITE_OTHER): Payer: Commercial Managed Care - PPO | Admitting: Internal Medicine

## 2016-04-03 ENCOUNTER — Other Ambulatory Visit (INDEPENDENT_AMBULATORY_CARE_PROVIDER_SITE_OTHER): Payer: Commercial Managed Care - PPO

## 2016-04-03 ENCOUNTER — Encounter: Payer: Self-pay | Admitting: Internal Medicine

## 2016-04-03 VITALS — BP 100/80 | Temp 98.2°F | Resp 16 | Ht 60.0 in | Wt 197.0 lb

## 2016-04-03 DIAGNOSIS — E039 Hypothyroidism, unspecified: Secondary | ICD-10-CM

## 2016-04-03 LAB — BASIC METABOLIC PANEL
BUN: 17 mg/dL (ref 6–23)
CHLORIDE: 99 meq/L (ref 96–112)
CO2: 31 mEq/L (ref 19–32)
CREATININE: 0.96 mg/dL (ref 0.40–1.20)
Calcium: 9.5 mg/dL (ref 8.4–10.5)
GFR: 69.84 mL/min (ref >60.00)
GLUCOSE: 104 mg/dL — AB (ref 70–99)
POTASSIUM: 4.5 meq/L (ref 3.5–5.1)
Sodium: 138 mEq/L (ref 135–145)

## 2016-04-03 LAB — T4, FREE: FREE T4: 1.59 ng/dL (ref 0.60–1.60)

## 2016-04-03 NOTE — Assessment & Plan Note (Signed)
Cardiologist recently checked TSH and forwarded it but did not respond to my request to add on a T4 so will check today. Will add on BMP as she needs this done tomorrow for cardiology to avoid unnecessary blood draws. Adjust her synthroid 125 mcg daily as needed although I doubt that she is over suppressed given last T4 was 0.8.

## 2016-04-03 NOTE — Progress Notes (Signed)
   Subjective:    Patient ID: Meredith Diaz, female    DOB: [birth year], 35 y.o.   MRN: [id number]  HPI The patient is a 35 YO female coming in for follow up on her thyroid. She is feeling better since the dose increase. She is not having the hair loss like she was previously. She is having the same energy problems. She has had her fluid pill changed by her cardiologist since the last visit and she feels this is helping some. Still doing the cardiac rehab but no exercise outside due to stress from recent loss of her grandmother. She is still coping with that. She is going to see rheumatology to help clarify if any of her symptoms are related to autoimmune disease.   Review of Systems  Constitutional: Positive for activity change, appetite change and fatigue. Negative for chills, fever and unexpected weight change.  Respiratory: Positive for shortness of breath. Negative for cough, chest tightness and wheezing.   Cardiovascular: Negative for chest pain, palpitations and leg swelling.  Gastrointestinal: Negative.   Musculoskeletal: Negative.   Neurological: Negative.   Psychiatric/Behavioral: Positive for decreased concentration and dysphoric mood.      Objective:   Physical Exam  Constitutional: She is oriented to person, place, and time. She appears well-developed and well-nourished.  Overweight  HENT:  Head: Normocephalic and atraumatic.  Eyes: EOM are normal.  Neck: Normal range of motion.  Cardiovascular: Normal rate and regular rhythm.   Pulmonary/Chest: Effort normal and breath sounds normal. No respiratory distress. She has no wheezes. She has no rales.  Abdominal: Soft. Bowel sounds are normal. She exhibits no distension. There is no tenderness. There is no rebound.  Musculoskeletal: She exhibits no edema.  Neurological: She is alert and oriented to person, place, and time. Coordination normal.  Skin: Skin is warm and dry.   Vitals:   04/03/16 0845  BP: 100/80  Resp: 16    Temp: 98.2 F (36.8 C)  TempSrc: Oral  SpO2: 95%  Weight: 197 lb (89.4 kg)  Height: 5' (1.524 m)      Assessment & Plan:

## 2016-04-03 NOTE — Patient Instructions (Signed)
We will check the labs today and send you the results and the heart doctor.

## 2016-04-03 NOTE — Progress Notes (Signed)
Pre visit review using our clinic review tool, if applicable. No additional management support is needed unless otherwise documented below in the visit note. 

## 2016-04-04 ENCOUNTER — Encounter (HOSPITAL_COMMUNITY): Payer: Self-pay

## 2016-04-04 ENCOUNTER — Other Ambulatory Visit (HOSPITAL_COMMUNITY): Payer: Commercial Managed Care - PPO

## 2016-04-06 ENCOUNTER — Encounter (HOSPITAL_COMMUNITY): Payer: Self-pay

## 2016-04-06 DIAGNOSIS — I5042 Chronic combined systolic (congestive) and diastolic (congestive) heart failure: Secondary | ICD-10-CM | POA: Insufficient documentation

## 2016-04-09 ENCOUNTER — Encounter (HOSPITAL_COMMUNITY): Payer: Self-pay

## 2016-04-09 ENCOUNTER — Telehealth (HOSPITAL_COMMUNITY): Payer: Self-pay | Admitting: *Deleted

## 2016-04-09 NOTE — Telephone Encounter (Signed)
Pt had been referred to Dr Dierdre Forth for malaise, received note from [facility] Rheumatology pt is sch to see him on 05/30/16 at 1:30.

## 2016-04-11 ENCOUNTER — Encounter (HOSPITAL_COMMUNITY): Payer: Self-pay

## 2016-04-13 ENCOUNTER — Encounter (HOSPITAL_COMMUNITY): Payer: Self-pay

## 2016-04-16 ENCOUNTER — Encounter (HOSPITAL_COMMUNITY): Payer: Self-pay

## 2016-04-18 ENCOUNTER — Encounter (HOSPITAL_COMMUNITY)
Admission: RE | Admit: 2016-04-18 | Discharge: 2016-04-18 | Disposition: A | Discharge status: Home / Self Care | Payer: Self-pay | Source: Ambulatory Visit | Attending: Cardiology | Admitting: Cardiology

## 2016-04-19 ENCOUNTER — Encounter (HOSPITAL_COMMUNITY)
Admission: RE | Admit: 2016-04-19 | Discharge: 2016-04-19 | Disposition: A | Discharge status: Home / Self Care | Payer: Self-pay | Source: Ambulatory Visit | Attending: Cardiology | Admitting: Cardiology

## 2016-04-20 ENCOUNTER — Encounter (HOSPITAL_COMMUNITY): Payer: Self-pay

## 2016-04-23 ENCOUNTER — Encounter (HOSPITAL_COMMUNITY): Payer: Self-pay

## 2016-04-25 ENCOUNTER — Encounter (HOSPITAL_COMMUNITY): Payer: Self-pay

## 2016-04-26 ENCOUNTER — Encounter (HOSPITAL_COMMUNITY)
Admission: RE | Admit: 2016-04-26 | Discharge: 2016-04-26 | Disposition: A | Discharge status: Home / Self Care | Payer: Self-pay | Source: Ambulatory Visit | Attending: Cardiology | Admitting: Cardiology

## 2016-04-27 ENCOUNTER — Encounter (HOSPITAL_COMMUNITY): Payer: Self-pay

## 2016-05-02 ENCOUNTER — Encounter (HOSPITAL_COMMUNITY): Payer: Self-pay

## 2016-05-04 ENCOUNTER — Encounter (HOSPITAL_COMMUNITY): Payer: Self-pay

## 2016-05-09 ENCOUNTER — Encounter (HOSPITAL_COMMUNITY): Payer: Self-pay

## 2016-05-09 DIAGNOSIS — I5022 Chronic systolic (congestive) heart failure: Secondary | ICD-10-CM | POA: Insufficient documentation

## 2016-05-11 ENCOUNTER — Encounter (HOSPITAL_COMMUNITY): Payer: Self-pay

## 2016-05-14 ENCOUNTER — Encounter (HOSPITAL_COMMUNITY): Payer: Self-pay

## 2016-05-16 ENCOUNTER — Encounter (HOSPITAL_COMMUNITY): Payer: Self-pay

## 2016-05-18 ENCOUNTER — Encounter (HOSPITAL_COMMUNITY)
Admission: RE | Admit: 2016-05-18 | Discharge: 2016-05-18 | Disposition: A | Discharge status: Home / Self Care | Payer: Self-pay | Source: Ambulatory Visit | Attending: Cardiology | Admitting: Cardiology

## 2016-05-21 ENCOUNTER — Encounter (HOSPITAL_COMMUNITY): Payer: Self-pay

## 2016-05-21 DIAGNOSIS — G4733 Obstructive sleep apnea (adult) (pediatric): Secondary | ICD-10-CM | POA: Diagnosis not present

## 2016-05-23 ENCOUNTER — Encounter (HOSPITAL_COMMUNITY): Payer: Self-pay

## 2016-05-25 ENCOUNTER — Encounter (HOSPITAL_COMMUNITY): Payer: Self-pay

## 2016-05-28 ENCOUNTER — Encounter (HOSPITAL_COMMUNITY): Payer: Self-pay

## 2016-05-30 ENCOUNTER — Encounter (HOSPITAL_COMMUNITY): Payer: Self-pay

## 2016-05-30 DIAGNOSIS — D8989 Other specified disorders involving the immune mechanism, not elsewhere classified: Secondary | ICD-10-CM | POA: Diagnosis not present

## 2016-05-30 DIAGNOSIS — R5382 Chronic fatigue, unspecified: Secondary | ICD-10-CM | POA: Diagnosis not present

## 2016-05-30 DIAGNOSIS — M791 Myalgia: Secondary | ICD-10-CM | POA: Diagnosis not present

## 2016-06-01 ENCOUNTER — Encounter (HOSPITAL_COMMUNITY)
Admission: RE | Admit: 2016-06-01 | Discharge: 2016-06-01 | Disposition: A | Discharge status: Home / Self Care | Payer: Self-pay | Source: Ambulatory Visit | Attending: Cardiology | Admitting: Cardiology

## 2016-06-03 ENCOUNTER — Other Ambulatory Visit (HOSPITAL_COMMUNITY): Payer: Self-pay | Admitting: Cardiology

## 2016-06-04 ENCOUNTER — Encounter (HOSPITAL_COMMUNITY): Payer: Self-pay

## 2016-06-06 ENCOUNTER — Encounter (HOSPITAL_COMMUNITY): Admission: RE | Admit: 2016-06-06 | Payer: Self-pay | Source: Ambulatory Visit

## 2016-06-08 ENCOUNTER — Encounter (HOSPITAL_COMMUNITY): Payer: Commercial Managed Care - PPO

## 2016-06-08 DIAGNOSIS — I5022 Chronic systolic (congestive) heart failure: Secondary | ICD-10-CM | POA: Diagnosis not present

## 2016-06-11 ENCOUNTER — Encounter (HOSPITAL_COMMUNITY): Payer: Commercial Managed Care - PPO

## 2016-06-11 ENCOUNTER — Other Ambulatory Visit (HOSPITAL_COMMUNITY): Payer: Self-pay | Admitting: Cardiology

## 2016-06-13 ENCOUNTER — Encounter (HOSPITAL_COMMUNITY): Payer: Commercial Managed Care - PPO

## 2016-06-15 ENCOUNTER — Encounter (HOSPITAL_COMMUNITY): Payer: Commercial Managed Care - PPO

## 2016-06-18 ENCOUNTER — Encounter (HOSPITAL_COMMUNITY): Payer: Commercial Managed Care - PPO

## 2016-06-20 ENCOUNTER — Encounter (HOSPITAL_COMMUNITY)
Admission: RE | Admit: 2016-06-20 | Discharge: 2016-06-20 | Disposition: A | Discharge status: Home / Self Care | Payer: Commercial Managed Care - PPO | Source: Ambulatory Visit | Attending: Cardiology | Admitting: Cardiology

## 2016-06-21 DIAGNOSIS — G4733 Obstructive sleep apnea (adult) (pediatric): Secondary | ICD-10-CM | POA: Diagnosis not present

## 2016-06-22 ENCOUNTER — Encounter (HOSPITAL_COMMUNITY)
Admission: RE | Admit: 2016-06-22 | Discharge: 2016-06-22 | Disposition: A | Discharge status: Home / Self Care | Payer: Commercial Managed Care - PPO | Source: Ambulatory Visit | Attending: Cardiology | Admitting: Cardiology

## 2016-06-25 ENCOUNTER — Encounter (HOSPITAL_COMMUNITY): Payer: Commercial Managed Care - PPO

## 2016-06-27 ENCOUNTER — Encounter (HOSPITAL_COMMUNITY): Payer: Commercial Managed Care - PPO

## 2016-06-29 ENCOUNTER — Encounter (HOSPITAL_COMMUNITY): Payer: Commercial Managed Care - PPO

## 2016-07-02 ENCOUNTER — Encounter (HOSPITAL_COMMUNITY): Payer: Commercial Managed Care - PPO

## 2016-07-04 ENCOUNTER — Encounter (HOSPITAL_COMMUNITY): Payer: Commercial Managed Care - PPO

## 2016-07-04 ENCOUNTER — Encounter: Payer: Self-pay | Admitting: Internal Medicine

## 2016-07-04 ENCOUNTER — Ambulatory Visit (INDEPENDENT_AMBULATORY_CARE_PROVIDER_SITE_OTHER)
Admission: RE | Admit: 2016-07-04 | Discharge: 2016-07-04 | Disposition: A | Discharge status: Home / Self Care | Payer: Commercial Managed Care - PPO | Source: Ambulatory Visit | Attending: Internal Medicine | Admitting: Internal Medicine

## 2016-07-04 ENCOUNTER — Ambulatory Visit (INDEPENDENT_AMBULATORY_CARE_PROVIDER_SITE_OTHER): Payer: Commercial Managed Care - PPO | Admitting: Internal Medicine

## 2016-07-04 VITALS — BP 120/70 | HR 80 | Temp 97.7°F | Ht 60.0 in | Wt 202.0 lb

## 2016-07-04 DIAGNOSIS — Z6839 Body mass index (BMI) 39.0-39.9, adult: Secondary | ICD-10-CM

## 2016-07-04 DIAGNOSIS — R0602 Shortness of breath: Secondary | ICD-10-CM

## 2016-07-04 DIAGNOSIS — E6609 Other obesity due to excess calories: Secondary | ICD-10-CM

## 2016-07-04 DIAGNOSIS — IMO0001 Reserved for inherently not codable concepts without codable children: Secondary | ICD-10-CM

## 2016-07-04 DIAGNOSIS — F4323 Adjustment disorder with mixed anxiety and depressed mood: Secondary | ICD-10-CM | POA: Diagnosis not present

## 2016-07-04 DIAGNOSIS — R0609 Other forms of dyspnea: Secondary | ICD-10-CM | POA: Diagnosis not present

## 2016-07-04 NOTE — Progress Notes (Signed)
   Subjective:    Patient ID: Meredith Diaz, female    DOB: 10/23/1980, 36 y.o.   MRN: [id number]  HPI The patient is a 36 YO female coming in for follow up of her medical conditions including her weight (up about 5 pounds since last visit, still doing some of the exercise, not as motivated lately because her depression is getting worse, having some more SOB), worsening dyspnea (more SOB and limiting her activities, she denies cough or congestion, no nasal drainage, she is still taking her fluid pills and weight is up some since last visit but also some change to diet), and her depression (this is getting worse, cymbalta is not helping much anymore, she is able to get to work and make it through her day but has no other energy or motivation, she denies crying spells, no pleasure from normally pleasurable activity, no side effects from cymbalta).   Review of Systems  Constitutional: Positive for activity change, appetite change and fatigue. Negative for chills, fever and unexpected weight change.  HENT: Negative.   Eyes: Negative.   Respiratory: Positive for shortness of breath. Negative for cough, choking, chest tightness and wheezing.   Cardiovascular: Negative for chest pain, palpitations and leg swelling.  Gastrointestinal: Negative for abdominal distention, abdominal pain, blood in stool, constipation, diarrhea and nausea.  Musculoskeletal: Positive for arthralgias and myalgias. Negative for back pain, gait problem and joint swelling.  Skin: Negative.   Neurological: Negative.   Psychiatric/Behavioral: Positive for decreased concentration, dysphoric mood and sleep disturbance. Negative for self-injury and suicidal ideas. The patient is nervous/anxious.       Objective:   Physical Exam  Constitutional: She is oriented to person, place, and time. She appears well-developed and well-nourished.  HENT:  Head: Normocephalic and atraumatic.  Mouth/Throat: Oropharynx is clear and moist.  Eyes:  EOM are normal.  Neck: Normal range of motion. No JVD present.  Cardiovascular: Normal rate and regular rhythm.   Pulmonary/Chest: Effort normal and breath sounds normal.  Limited by habitus, stable from prior  Abdominal: Soft. Bowel sounds are normal. She exhibits no distension. There is no tenderness. There is no rebound.  Musculoskeletal: She exhibits no edema.  Lymphadenopathy:    She has no cervical adenopathy.  Neurological: She is alert and oriented to person, place, and time. Coordination normal.  Skin: Skin is warm and dry.  Psychiatric:  Flat affect   Vitals:   07/04/16 0831  BP: 120/70  Pulse: 80  Temp: 97.7 F (36.5 C)  TempSrc: Oral  SpO2: 96%  Weight: 202 lb (91.6 kg)  Height: 5' (1.524 m)      Assessment & Plan:

## 2016-07-04 NOTE — Patient Instructions (Signed)
We will have you double the cymbalta for 1-2 weeks and see if this helps more.  We will check the chest x-ray today and call you back with the results.

## 2016-07-04 NOTE — Progress Notes (Signed)
Pre visit review using our clinic review tool, if applicable. No additional management support is needed unless otherwise documented below in the visit note. 

## 2016-07-06 ENCOUNTER — Encounter (HOSPITAL_COMMUNITY): Payer: Commercial Managed Care - PPO | Attending: Cardiology

## 2016-07-06 DIAGNOSIS — I5022 Chronic systolic (congestive) heart failure: Secondary | ICD-10-CM | POA: Diagnosis not present

## 2016-07-06 NOTE — Assessment & Plan Note (Signed)
Weight is up and partially suspect worsening of her depression causing change in diet and lack of exercise. She is motivated to make changes but not at this time until her depression is controlled. She is aware that some of her health problems are directly related to her weight.

## 2016-07-06 NOTE — Assessment & Plan Note (Signed)
Worsening, lung exam stable. Weight is up about 5 pounds since last visit but suspect actual weight gain. Will check CXR today for any fluid build up. This does not sound like asthma flare or cold as she has no additional symptoms of this. Could be restriction which is worsened with change in weight.

## 2016-07-06 NOTE — Assessment & Plan Note (Signed)
cymbalta is not helping anymore at 60 mg daily dosing. Will double to 120 mg daily for 1-2 weeks and if no improvement will switch to wellbutrin. She has had bad side effects from zoloft and paxil in the past. She also has problems with her weight and this ties into her depression symptoms so want to avoid any medications which would cause weight gain. This problem is worsening and causing QOL issues.

## 2016-07-09 ENCOUNTER — Encounter (HOSPITAL_COMMUNITY): Payer: Commercial Managed Care - PPO

## 2016-07-11 ENCOUNTER — Encounter (HOSPITAL_COMMUNITY): Payer: Commercial Managed Care - PPO

## 2016-07-13 ENCOUNTER — Encounter (HOSPITAL_COMMUNITY): Payer: Commercial Managed Care - PPO

## 2016-07-16 ENCOUNTER — Encounter (HOSPITAL_COMMUNITY): Payer: Commercial Managed Care - PPO

## 2016-07-18 ENCOUNTER — Encounter (HOSPITAL_COMMUNITY): Payer: Commercial Managed Care - PPO

## 2016-07-19 DIAGNOSIS — G4733 Obstructive sleep apnea (adult) (pediatric): Secondary | ICD-10-CM | POA: Diagnosis not present

## 2016-07-20 ENCOUNTER — Encounter (HOSPITAL_COMMUNITY): Payer: Commercial Managed Care - PPO

## 2016-07-23 ENCOUNTER — Encounter (HOSPITAL_COMMUNITY): Payer: Commercial Managed Care - PPO

## 2016-07-25 ENCOUNTER — Encounter (HOSPITAL_COMMUNITY): Payer: Commercial Managed Care - PPO

## 2016-07-27 ENCOUNTER — Encounter (HOSPITAL_COMMUNITY): Payer: Commercial Managed Care - PPO

## 2016-07-30 ENCOUNTER — Encounter (HOSPITAL_COMMUNITY): Payer: Commercial Managed Care - PPO

## 2016-07-31 ENCOUNTER — Other Ambulatory Visit (HOSPITAL_COMMUNITY): Payer: Self-pay | Admitting: Cardiology

## 2016-08-01 ENCOUNTER — Encounter (HOSPITAL_COMMUNITY): Payer: Commercial Managed Care - PPO

## 2016-08-03 ENCOUNTER — Encounter (HOSPITAL_COMMUNITY): Payer: Commercial Managed Care - PPO

## 2016-08-06 ENCOUNTER — Encounter (HOSPITAL_COMMUNITY): Payer: Commercial Managed Care - PPO

## 2016-08-08 ENCOUNTER — Encounter (HOSPITAL_COMMUNITY): Payer: Commercial Managed Care - PPO

## 2016-08-10 ENCOUNTER — Encounter (HOSPITAL_COMMUNITY): Payer: Commercial Managed Care - PPO

## 2016-08-13 ENCOUNTER — Encounter (HOSPITAL_COMMUNITY): Payer: Commercial Managed Care - PPO

## 2016-08-15 ENCOUNTER — Encounter (HOSPITAL_COMMUNITY): Payer: Commercial Managed Care - PPO

## 2016-08-15 ENCOUNTER — Ambulatory Visit: Payer: Commercial Managed Care - PPO | Admitting: Pulmonary Disease

## 2016-08-16 ENCOUNTER — Encounter: Payer: Self-pay | Admitting: Pulmonary Disease

## 2016-08-16 ENCOUNTER — Ambulatory Visit (INDEPENDENT_AMBULATORY_CARE_PROVIDER_SITE_OTHER): Payer: Commercial Managed Care - PPO | Admitting: Pulmonary Disease

## 2016-08-16 VITALS — BP 114/70 | HR 83 | Temp 97.4°F | Ht 60.0 in | Wt 200.0 lb

## 2016-08-16 DIAGNOSIS — F4323 Adjustment disorder with mixed anxiety and depressed mood: Secondary | ICD-10-CM

## 2016-08-16 DIAGNOSIS — G4734 Idiopathic sleep related nonobstructive alveolar hypoventilation: Secondary | ICD-10-CM

## 2016-08-16 DIAGNOSIS — G4733 Obstructive sleep apnea (adult) (pediatric): Secondary | ICD-10-CM

## 2016-08-16 DIAGNOSIS — I10 Essential (primary) hypertension: Secondary | ICD-10-CM

## 2016-08-16 DIAGNOSIS — J4530 Mild persistent asthma, uncomplicated: Secondary | ICD-10-CM | POA: Diagnosis not present

## 2016-08-16 DIAGNOSIS — M4125 Other idiopathic scoliosis, thoracolumbar region: Secondary | ICD-10-CM

## 2016-08-16 DIAGNOSIS — Z6838 Body mass index (BMI) 38.0-38.9, adult: Secondary | ICD-10-CM

## 2016-08-16 DIAGNOSIS — IMO0001 Reserved for inherently not codable concepts without codable children: Secondary | ICD-10-CM

## 2016-08-16 DIAGNOSIS — J984 Other disorders of lung: Secondary | ICD-10-CM

## 2016-08-16 DIAGNOSIS — I5042 Chronic combined systolic (congestive) and diastolic (congestive) heart failure: Secondary | ICD-10-CM

## 2016-08-16 DIAGNOSIS — I429 Cardiomyopathy, unspecified: Secondary | ICD-10-CM

## 2016-08-16 DIAGNOSIS — E6609 Other obesity due to excess calories: Secondary | ICD-10-CM | POA: Diagnosis not present

## 2016-08-16 NOTE — Progress Notes (Addendum)
Subjective:     Patient ID: Meredith Diaz, female   DOB: July 23, 1980, 36 y.o.   MRN: [id number]  HPI 36 y/o WF, referred by DrCrawford for a pulmonary evaluation due to a hx of Asthma and increased DOE;  She has a complex hx of congenital heart dis and a cardiomyopathy w/ combined sys&diast CHF; hx anorexia as a teen=> morbid obesity as a young adult; & scoliosis/ chest wall deformity w/ pulmonary restrictive disease...   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, Programmer, multimedia for [employer], pt of DrKollar & referred for pulmonary evaluation w/ hx asthma- she notes increased DOE & no relief from her inhalers> Meredith Diaz is a never smoker & was diagnosed w/ asthma first in 1 at age 36 or so when [facility] for pneumonia (Cariology said she was hosp for CHF w/ EF=25-30% and was in hosp for 59mo) & she's had trouble ever since then, disch on NEBS;  Treated over the yrs w/ MDIs, Symbicort/Advair (the latter didn't help), last had Pred before 2013 hosp;  She was allergy tested in middle school & essentially neg x mild grass reaction by her memory;  She gets "sinus infections" a couple of times each yr (she thinks this is her main trigger) & uses Claritin/ Flonase prn;  Symptoms included SOB/ DOE w/ min activ, feels like she can't get a deep breath/ can't get enough air "in"/ not satisfied breathing (states this sensation is present all the time/constant, & she notes "gasping"); states she notes some wheezing when supine at night & some voice issues (?VCD, tight burning in throat) but denies reflux/ dysphagia/ nocturnal cough or choking (note- she has IBS & s/pGB 2013)... She also c/o sleep issues> can't sleep well while lying down, husb c/o her "wheezing"/ some snoring (but does not indicate that she stops breathing)/ restless sleeper/ leg jerks; she does have some daytime sleepiness issues w/ incr fatigue at work and she'll take naps on weekends; ESS=12... Current Pulm Meds> Symbicort160-  2puffs daily, AlbutHFA for prn use...       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 Cumulative data from cardiac evals as follows, she was told her cardiac issues are ?congenital, ?virus damaged her heart>> she has never had cardiac rehab etc...   CXR 2013 at [facility] showed cardiomegaly, biapical pleural thickening, otherw clear & NAD...  Cardiac MRI from [facility] in 2013 showed EF=52% w/ mild global HK, min AI, RV- wnl, Ao- wnl, mild pectus excavqatum w/ deviation of cardiac apex...  Cath was done in 2013 but I cannot find that report (said no signif CAD)...  EKG 11/15 showed STachy, rate 103, ?LAE, suggestive LVH, NSSTTWA...   2DEcho 11/15 by DrHarding showed mild LVH, ?poss apical variant hypertrophic cardiomyopathy, EF=40-45% w/ diffuse HK, Gr1DD, MV- mildly thickened leaflets and trivMR, norm LA size...  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 Notes hx anorexia/ depression when in high school w/ lowest wt=80 lbs; psyche rx & counseling resulted in wt gain & she was ~150 lbs in college; gained to  180# after college & lost wt to 165 prior to her marriage at age 30; now she is 36 y/o & has gained to max 203#... She is allergic to ACE inhibitors w/ prev angioedema...  EXAM reveals Afeb, VSS, O2sat=93% on RA at rest;  HEENT- neg, mallampati 2, voice sl hoarse;  Chest- decr BS bilat, clear, decr diaph excursions by percussion, freq sign breaths;  Heart- RRR w/p m/r/g;  Ext- no c/c/e...  Recent LABS> Chems- wnl;  CBC- ok w/ Hg=12.4, WBC=14K;  TSH=2.62;  B12 level= 180-250 (on  oral B12- 1000mcg/d)...  CXR 08/16/14 showed norm heart size, clear lungs, NAD...  Spirometry 08/16/14> poor effort (breathing "high" in her lung volumes)> FVC=0.84 (27%), FEV1=0.66 (24%), %1sec=78, mid-flows reduced at 18% predicted; Test indicated severe restriction w/ small airways dis...   Ambulatory oxygen saturations> O2sat=95% on RA at rest w/ pulse=89/min;  She walked 2 laps and stopped due to SOB- O2sat=83% w/ pulse 121/min...  FENO= 10ppb (results <25ppd implies absence of eosinophilic airway inflammation).  Epworth Sleepiness Score= 12 (indicating that she may be excessively sleepy depending on the situation... IMP/PLAN>>  Clarence has been diagnosed w/ asthma over the last 20+yrs but her current symptoms and spirometry looks more restricted- prob due to "chest wall factors" as we discussed;  She has additional issues from obesity- r/o OSA, and from her Cardiac disease- hx congenital HD w/ surg, HBP, cardiomyopathy w/ combined sys/diast CHF- followed & treated by Cards, DrHarding... We discussed the nature of her dyspnea and the need for additional testing and a trial of KLONOPIN 0.5mg Bid... We will sched FullPFTs and a Sleep Study and have her return after that...  ~  August 26, 2014:  2wk ROV & Meredith Diaz returns having completed her Full PFTs and Sleep study> she has in addition been on the Klonopin 0.5mg Bid but notes no improvement in her breathing on this dose (tol well just no better)...  Full PFTs 08/25/14 showed FVC=0.87 (26%), FEV1=0.77(27%), %1sec=88, mid-flows reduced at 28% predicted; after bronchodil- the FEV1 improved 6%; Lung volumes reduced w/ TLC=1.88 (42%), RV=0.85 (66%), RV/TLC=45 (some air trapping); DLCO=25% predicted, but corrects to normal (102%) when alveolar ventilation is taken into account...  Sleep Polysomnogram 08/18/14> Mod OSA w/ AHI=20/hr and REM AHI=54, mod snoring, desaturation to 64%...  IMP/PLANS>>  PFTs severely restricted (?effort/ accuracy) & seems to be  mostly related to obesity/ chest wall and not to underlying lung parenchymal factors (CXR clear, exam clear, we will consider hi-res CTChest later if needed); In this regard we will increase the KLONOPIN to 1mgBid before giving up on this med; she understands how important wt reduction is to her overall improvement- may want to consider eating disorder counseling again... Her Sleep study reveals mod OSA & nocturnal hypoxemia- I discussed w/ DrClance & we decided on CPAP set up> RESMED S10 machine- air/auto w/ heated humidity & climate controlled tubing, set on AUTO 5-15, enroll in AirView, mask interface of choice per DME company & pt preference... We will plan ROV recheck in 6 weeks to review compliance & efficacy, then consider doing an ONO...   ~  October 14, 2014:  6wk ROV & Valleri has lost 2# in the interval but not able to exercise- walks some, gets winded, stairs are difficult but able to negotiate one flight; we reviewed how critically important wt reduction is for her;  She still appears sl hoarse to me, scratchy voice which she believes is due to "allergy issues" => rec ENT evaluation for   completeness;  She increased the Klonopin to 1mgBid & this is helping some she says- not as SOB at rest, able to get a deeper breath she thinks, etc;  Using CPAP satis w/ good compliance & download data showing AHI<1/hr on CPAP; she has nasal pillows but wants full face mask & we will let [employer] know... We reviewed the following medical problems>>     DYSPNEA- multifactorial> ?asthma is a small component w/ restrictive dis due to obesity & scoliosis as a major factor, and CHF, deconditioning, anxiety as additional factors...    OSA>  Mod OSA on sleep study 4/16 w/ AHI=20/hr and REM AHI=54, mod snoring, desaturation to 64%; started on CPAP via [employer], auto 5-15 w/ good compliance => AHI <1/hr on the CPAP autoset 5-15, she wants to change nasal pillows to full face mask, we will check w/ [employer], then do ONO...    Hx  asthma> on Symbicort160- 2sp daily, VentolinHFA rescue prn- using 2-3x/wk; PFT 4/16 w/ severe restriction, + small airways dis & min response to bronchodil; asked to continue inhalers for now...    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    Hx HBP> on MetopER25, Apres25- 1.5 tabsTid, Lasix40- 1.5tabsQam, Zaroxyln2.5 prn swelling- taking it almost every day; K=4.2 5/16 at [facility]; Note> allergic to ACE w/ angioedema of lips in past; BP= 130/88 today w/o CP etc...    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    Eating disorder/ Morbid Obesity> unusual hx of eating disorder w/ ANOREXIA as teen=> OBESITY w/ wt=201#, 5' Tall, BMI=39-40; she notes hx anorexia/ depression when in high school w/ lowest wt=80 lbs; psyche rx & counseling resulted in wt gain & she was ~150 lbs in college; gained to 180# after college & lost wt to 165 prior to her marriage at age 30 (she felt better when wt was down); now she is 36 y/o & has gained to max 203#...    Hypothyroidism> on Synthroid50; Labs 12/15 showed TSH=2.62, FreeT4=0.87    Hx IBS & s/p GB in 2013> aware...    Scoliosis & back pain> she was prev evaluated at [facility] by a back specialist, she does not recall the details, no records available, she notes legs get tired, she is not falling, notes that her hands shake on occas... This is contrib to her pulmonary restriction and dyspnea => need back films then consider  referral the [facility] spine center for their review & recommendations...    VitB12 defic> Labs showed B12 blood level 179-248 and she is rec to take B12 1000mcg orally Qd w/ recheck levels...     Anxiety/ depression> on Klonopin 1mg Bid at present... EXAM reveals Afeb, VSS, O2sat=94% on RA at rest; HEENT- neg, mallampati 2, voice sl hoarse;  Chest- decr BS bilat, clear, decr diaph excursions by percussion, occas sign breaths;  Heart- RRR w/p m/r/g;  Ext- no c/c/e.  LABS 5/16 at [facility] showed Chems- wnl w/ Cr=1.04, BS=83, A1c=5.8;  FLP- at   goals on diet alone...  [employer] AirView download> 5/11 - 10/13/14 showed good compliance w/ AHI <1/hr on the CPAP autoset 5-15... PLAN>>  She has a very complex medical situation as outlined above, and we are hampered by lack of old records (I have asked her to help in getting this data);  For now she will continue the Symbicort, Ventolin, Klonopin, & CPAP; we will have [employer] look into full face mask per her preference; then proceed w/ ONO;  She knows the importance of wt reduction & may want to consider counseling from Dr. Ed Lurie for eating disorder;  Continue cardiac meds and f/u w/ DrHarding;  She is only 36 y/o w/ signif pulm restriction & heart disease;  We will XRay her spine in anticipation of getting old records from [facility] & poss referral if this is playing a signif roll as well...  ~  December 14, 2014:  2mo ROV & Canna returns for follow up> stable on her current regimen; she has multifactorial dyspnea>     OSA>  Mod OSA on sleep study 4/16 w/ AHI=20/hr and REM AHI=54, mod snoring, desaturation to 64%; started on CPAP via [employer], auto 5-15 w/ good compliance => AHI <1/hr on the CPAP autoset 5-15, she changed nasal pillows to full face mask, we will check download on FM, then do ONO...    Hx asthma> on Symbicort160- 2sp daily, VentolinHFA rescue prn- using 2-3x/wk; PFT 4/16 w/ severe restriction, + small airways dis & min response to bronchodil; asked to continue  inhalers for now...    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    Hx HBP> on MetopER25, Apres25- 1.5 tabsTid, Lasix40- 1.5tabsQam, Zaroxyln2.5 prn swelling- taking it almost every day; K=4.2 5/16 at [facility]; Note> allergic to ACE w/ angioedema of lips in past; BP= 130/88 today w/o CP etc...    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    Eating disorder/ Morbid Obesity> unusual hx of eating disorder w/ ANOREXIA as teen=> OBESITY w/ wt=204#, 5' Tall, BMI=39-40; she notes hx anorexia/ depression when in high school w/ lowest wt=80 lbs; psyche rx & counseling resulted in wt gain & she was ~150 lbs in college; gained to 180# after college & lost wt to 165 prior to her marriage at age 30 (she felt better when wt was down); now she is 36 y/o & has gained to max 204#... EXAM reveals Afeb, VSS, O2sat=95% on RA at rest; HEENT- neg, mallampati 2, voice sl hoarse;  Chest- decr BS bilat, clear, decr diaph excursions by percussion, occas sign breaths;  Heart- RRR w/p m/r/g;  Ext- no c/c/e. PLAN>>  Liliani will soon start cardiac rehab w/ it's exercise & nutritional counseling- it is imperitive that she get the weight down!!!  We will check CPAP download on full face mask & then ONO on the CPAP set up to be sure that she is not desaturating...  ADDENDUM>>  CPAP download 8/10 - 01/13/15 on autoset 5-15 and now   w/ FULL FaceMask showed 90% compliance, ave 5-6H per night,  AHI=0.6.Marland KitchenMarland Kitchen Continue same. ADDENDUM>>  ONO done 01/13/15 on CPAP on RA showed O2sat=88% for 25.10mn of the 7H study; lowest O2sat=63%; oxygen desat index=51 desat events per hr; she qualifies for O2 bleed-in at 2L/min for her CPAP unit...  ~  March 17, 2015:  327moOV & ElLadenas in Cardiac Rehab per DrHarding, sl improved & planning to continue in maintenance program;  She notes that her breathing is "ok" except w/ weather changes, she's been using the O2 bled into her CPAP Qhs; states she awakes at 3-4AM & can't get back to sleep- she remains on Klonopin 68m68mid, she tried TylenoloPM w/o help, tried her Lorazepam w/o help, we discussed trial of DESYREL25-50Qhs...     [facility] by [facility] w/ CP> known nonischemic cardiomyop w/ EF=40-45%, stress test done by Cards 12/06/14 was neg for ischemia but showed poor exercise tol & hypertensive response; she had CTAngio in ER- neg for PE, CP resolved w/ MS; serial EKG & enz were neg- they added Aldactone and disch for Cards f/u...  She saw DrHarding 01/27/15 => meds adjusted & his note is reviewed, they plan rov in Dec...     She continues to f/u w/ DrKollar who started Cymbalta for depression- slight improved... EXAM reveals Afeb, VSS, O2sat=97% on RA at rest; HEENT- neg, mallampati 2, voice sl hoarse;  Chest- decr BS bilat, decr chest wall movement and decr diaph excursions by percussion, occas sign breaths, clear;  Heart- RRR w/p m/r/g;  Ext- no c/c/e.  CXR 01/05/15 showed ?left pleural thickening, r/o effusion, no focal airsp dis, scoliosis...  CT Angio Chest 01/05/15 showed mild cardiomeg, no evid of PE, ectasia of Ao measuring 3.7cm, no adenopathy, no consolidation or effusion, min Atx at left base, fusion of several vertebrae, fusion of several post ribs bilat...  LABS 12/2014> K was low at 3.0=> supplemented to norm;  BS in the 120-130 range;  Cr in the 1.1-1.2 range;  Troponins=neg;  BNP=19;  CBC- wnl... IMP/PLAN>>  She has severe pulmonary  restriction- obesity, scoliosis, ankylosis;  Weight is down to 187# on diet & cards rehab exercises;  rec to continue current meds and we will try Desyrel 25-50 Qhs for sleep...  ~  June 20, 2015:  24mo46mo & last visit we continued same treatment regimen (encouraged diet, exercise, wt reduction) & added Desyrel25=>50Qhs to help her rest thru the night; ElliGussieorts that this was NOT working, still wakes at 3-4AYRC Worldwidels exhausted all the time, plus it caused her to ?rage & ?scream she says;  She continues in [employer] maintenance program & she endorses better stamina when walking but still feels SOB "can't get enough air" despite the Klonopin 68mg 568m; while being monitored during exercise she says her O2sats drop & I've asked her to check this formally & keep record to see if her ambulatory O2 needs modification (today in office her lowest O2sat=90% w/ exercise);  She tells me that DrCrawford (seen 2/17 c/o hair falling out, aching all over, exhausted, panic attacks) [facility] due to CBC w/ WBC=13-17K w/ prev norm diff; I suggested to the pt that her PCP may consider referral to Rheum for poss FM/CFS eval...     Hx asthma> on Symbicort160- 2sp daily, VentolinHFA rescue prn- using 1-2x/wk; PFT 4/16 w/ severe restriction, + small airways dis & min response to bronchodil; asked  to continue inhalers for now...    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    Hx HBP> on MetopER50, Diovan40Bid, Demadex20, Aldactone25, K20Bid, off Apres/ off Zarox; lab 2/17 w/ K=4.7 (norm renal & BS); Note> allergic to ACE w/ angioedema of lips in past; BP= 102/70 today w/o CP etc...    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   - Cardiac MRI (2013) with EF 52%, no delayed enhancement.   - LHC in 2013 without significant CAD.  - 11/15 echo with EF 40-45%.   - 7/16 echo with EF 40-45%, grade II diastolic dysfunction (Last 2DEcho 7/16 showed EF=40-45% w/ diffuse HK, Gr2DD, norm valves, norm LA size; PA & RV are wnl)  - ETT (8/16) with poor exercise tolerance but no ischemia.   - CPX (12/16): peak VO2 15.7 (23.5 adjusted for ideal body weight), VE/VCO2 slope 21, RER 1.15 => moderate functional limitation thought to be primarily due to restrictive lung physiology.     Eating disorder/ Morbid Obesity> unusual hx of eating disorder w/ ANOREXIA as teen=> OBESITY w/ wt=204#, 5' Tall, BMI=39-40; she notes hx anorexia/ depression when in high school w/ lowest wt=80 lbs; psyche rx & counseling resulted in wt gain & she was ~150 lbs in college; gained to 180# after college & lost wt to 165 prior to her marriage at age 7 (she felt better when wt was down); now she is 36 y/o & has gained to max 204#...    Hypothyroidism> on Synthroid88; Labs 2/17 showed TSH=2.19...    Hx IBS & s/p GB in 2013> aware...    Scoliosis & back pain> she was prev evaluated at [facility] by a back specialist, she does not recall the details, no records available, she notes legs get tired, she is not falling, notes that her hands shake on occas... This is contrib to her pulmonary restriction and dyspnea => need back films then consider referral the [place] [facility] center for their review & recommendations...    VitB12 defic> Labs showed B12 blood level 179-248 and she is rec to take B12 1081mg orally Qd w/ recheck levels; Labs 2/17 showed B12=313    Anxiety/ depression> on Klonopin 154mBid at present & uses Ativan1m10mrn... EXAM reveals Afeb, VSS, O2sat=98% on RA at rest; 5'  Tall, wt=188#, BMI=36-37;  HEENT- neg, mallampati 2, voice sl hoarse;  Chest- decr BS bilat, decr chest wall movement and decr diaph excursions by percussion, occas sign breaths, clear;  Heart- RRR w/p m/r/g;  Ext- no c/c/e.  Cardio-Pulm exercise test 04/13/15 by DrMcLean>  Severe restrictive dis w/ decr in MVV; O2 desat to 87% w/ peak exercise...   Ambulatory Oximetry 06/20/15> O2sat=96% on RA at rest w/ pulse=89/min; she ambulated 3 laps on RA w/ lowest O2sat=90% w/ pulse=121/min...  CPAP download from 05/21/15=>06/19/15 showed 28/30d >4H, averaging 5-6H per night, Autoset5-15 w/ pressures 6-10cm & AHI=0;  rec to continue same... IMP/PLAN>>  She will continue same meds, continue her diet & [facility] [place] [facility] [place] there; we  refilled her Ativan to use prn for her panic attacks which are undoubtedly playing a roll in her dyspnea events...   ~  November 15, 2015:  5mo ROV & Jonni continues in the Cardiac Rehab maintenance program where she has plateaued but still working hard; Approx 1mo ago she fell w/ pain in back & knee, she saw DrZSmith & benefitted from brief course of Pred;  She reports incr SOB, chest feels tight, some wheezing noted esp in hot humid weather;  On Symbicort160-2spBid, AlbutHFA prn, she notes that AC helps more, notes Singulair didn't help in the past; we decided to add INCRUSE one puff daily & reminded to use her IS regularly for the restrictive lung component... See prob list above...    EXAM reveals Afeb, VSS, O2sat=98% on RA at rest; 5' Tall, wt=186#, BMI=36;  HEENT- neg, mallampati 2, voice sl hoarse;  Chest- decr BS bilat, decr chest wall movement and decr diaph excursions by percussion, occas sign breaths, clear;  Heart- RRR w/p m/r/g;  Ext- no c/c/e.  CPAP download 6/10 - 11/13/15>>  CPAP used 30/30 days, ave ~6H per night, Autoset 5-15, ave pressure= 11-12, working very well w/ AHI=0.1, small leak...  LABS 11/15/15>  CBC- Hg=12.9, mcv=87, wbc=19.1, eos=1%;   IgE=61;  RAST panel- MOD for molds, BORDERLINE for grasses/ few trees/ ragweed... IMP/PLAN>>  She is getting B12 shots from her PCP- DrCrawford;  We discussed continuing the Symbicort160-2spBid & AlbutHFA rescue inhaler prn; We are adding Incruse once daily; Reminded to use the IS frequently to expand the lungs w/ her chest wall restricted dis...  ~  February 15, 2016:  3mo ROV & Oluwadara' 88 y/o grandmother passed away suddenly last month ([place] after femoral hernia surg);  We reviewed mult medical problems during today's office visit >>     She saw DrCrawford 01/02/16 for medical f/u- note reviewed, mood stable on Cymbalta & klonopin, exercising at cards rehab, using CPAP (she wanted another sleep test since she's lost 20 lbs), taking B12, taking thyroid;  She is also followed by DrZSmith- seen 12/2015 w/ several issues and his note is reviewed (back pain improved and neuropathy symptoms better on Neurontin)...    She reports that her breathing has been OK but the humidity is a particular prob for her- she was seen by PCP 01/13/16 w/ incr chest congestion, cough & SOB, T99.5, incr use of rescue inhaler, chest was reported clear & she was Dx w/ sinusitis & givenAugmentin, Pred, Tussionex    Hx asthma> on Symbicort160- 2sp daily, Spiriva Respimat-2sp/d, VentolinHFA rescue prn- using 1-2x/wk; PFT 4/16 w/ severe restriction, + small airways dis & min response to bronchodil; asked to continue inhalers for now...    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    Hx HBP> on MetopER50, Diovan40Bid, Demadex20, Aldactone25, K20Bid, off Apres/ off Zarox; lab 2/17 w/ K=4.7 (norm renal & BS); Note> allergic to ACE w/ angioedema of lips in past; BP= 110/80 today w/o CP etc...    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    MEDICAL   Issues> Eating disorder w/ anorexia as teen=> obesity as adult; hypothy, IBS, scoliosis & back pain, B12 defic, anxiety/ depression...     EXAM reveals Afeb, VSS, O2sat=98% on RA at rest; 5' Tall, wt=194#, BMI=37;  HEENT- neg, mallampati 2, voice sl hoarse;  Chest- decr BS bilat, decr chest wall movement and decr diaph excursions by percussion, occas sigh breaths, clear;  Heart- RRR w/p m/r/g;  Ext- no c/c/e. IMP/PLAN>>  Giabella is stable on her current regimen- we reviewed regular dosing of her inhalers, taking meds regularly, continued [facility] re[facility] exercises and DIET for weight reduction;  She would like some Augmentin875 to keep on hand this winter- OK; we plan rov 78mo, sooner if needed prn...   ~  August 16, 2016:  78mo ROV & Anzley returns doing satis overall w/ her mult problems- no new complaints or concerns but there are mult issues to follow up today>>    She saw DrMcLean for CARDS in the CHF clinic 03/16/16> note reviewed, hx nonischemic cardiomyopathy, most recent Echo w/ EF=40-45% & diffuse HK, poor exercise tolerance, c/o SOB/ fatigue out of proportion to her CHF & he incr Demadex referred her to Rheum...    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    She now indicates that she is INTOL to CPAP, full face mask interface & she stopped it all on her own ~12/17 (didn't call etc), but she claims that she is resting well at night, sleeps thru the night, wakes refreshed, & not napping or  showing daytime hypersomnolence; she is also NOT using her O2 at night, says her husb does not c/o snoring, etc => needs ONO on RA now...    DYSPNEA (DOE)- multifactorial> ?asthma is a small component w/ restrictive dis due to obesity & scoliosis as a major factor, and CHF, deconditioning, anxiety as additional factors...    OSA, nocturnal hypoxemia>  Mod OSA on sleep study 4/16 w/ AHI=20/hr and REM AHI=54, mod snoring, desaturation to 64%; started on CPAP via [employer], auto 5-15 w/ good compliance => AHI <1/hr on the CPAP autoset 5-15, she changed nasal pillows to full face mask & download OK but f/u ONO w/ signif desats despite the CPAP- started on O2 2L/min bled into the machine... Then in Dec2017 she stopped CPAP on her own stating mask interface problems, too much noise, etc; currently NOT using CPAP or her O2 at night & states resting well, wakes refreshed, no daytime symptoms, & apparently not snoring => needs ONO on RA now...    Hx asthma> on Symbicort160- 2sp daily, Spiriva Respimat-2sp/d, VentolinHFA rescue prn- using 1-2x/wk; PFT 4/16 w/ severe restriction, + small airways dis & min response to bronchodil; asked to continue inhalers for now...    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    Hx HBP> on MetopER50, Diovan40Bid, Demadex20-2/d, Aldactone25, K20Bid; Note> allergic to ACE w/ angioedema of lips in past; BP= 114/70 today w/o CP etc...    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    MEDICAL Issues> Eating disorder w/ anorexia as teen=> obesity as adult; hypothy, IBS, scoliosis & back pain, B12 defic, anxiety/ depression; followed by DrCrawford...  EXAM reveals Afeb, VSS, O2sat=98% on RA at rest; 5' Tall, wt=200#, BMI=39;  HEENT- neg, mallampati 2, voice sl hoarse;  Chest- decr BS bilat, decr chest wall movement and decr diaph excursions by percussion, occas sigh breaths, clear;  Heart- RRR w/p m/r/g;  Ext- no c/c/e.  2DEcho 11/17 showed mildly reduced EF=45-50%, diffuse HK, diastolic parameters were wnl, valves OK, RV- ok but unable to est PAsys...  CXR 07/04/16 by PCP (independently reviewed by me in the PACS system) showed mild cardiomeg, sl eventration of left hemidiaph posteriorly, no infiltrates or effusions, NAD...  LABS 1/18 by Rheum>  elev Sed=52,  Elev CRP=38;  CPK- wnl... IMP/PLAN>>  Kevina has stopped her CPAP & noct O2 on her own due to prob w/ mask intrerface & noice from the machine; she feels that she is doing fine off the CPAP but prev studies showed mod OSA & signif O2desat at night=> we will check ONO on RA (as she has been for the last 81mo) ASAP to see if she needs the O2 by [place] at night;  She is asked to get on diet, get wt down, [facility]...  ~  ADDENDUM:  ONO on RA done 08/19/16>  It showed a nadir O2sat of 56% and she spent <88% qualifying her for nocturnal O2 at 2L/min Qhs => pt instructed to start back on her nocturnal O2 at 2+L/min Qhs...    Past Medical History  Diagnosis Date    DYSPNEA> restrictive lung dis, morbid obesity... OSA> Sleep study 08/2014 w/ AHI=20, worse in REM, desat to 64%... Hx anorexia => morbid obesity...    . Chronic diastolic congestive heart failure, NYHA class 2 07/2011    Echo: EF was 25-30%; No Sig CAD on CATH; No significant finding on Cardiac MRI; follow-up Ech0 2014 --  EF of 50%  . Asthma   . Thyroid disease  >>  On Synthroid50, TSH 12/15 = 2.62   .  Depression   . Anxiety   . Essential hypertension   . Stomach problems   . Hypertension   . 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 Cardiomyopathy     Unclear etiology. Last EF by echo 40-45% with global hypokinesis, mild LVH possible apical variant hypertrophic cardiomyopathy        B12 Deficiency >> on oral B12 supplement 1043mcg/d;  Labs showed B12 level 178 => 313    Past Surgical History:  Procedure Laterality Date  . CARDIAC CATHETERIZATION  08/2011   Normal Coronaries - LVEDP 32 mmHg  . Cardiac MRI  April 2013   No suggestion of a congenital abnormality; EF ~ 52% - no regional wall motion abnormalities., no increased myocardial signal intensity. No perfusion abnormality.  . CHOLECYSTECTOMY  2013  . TRANSTHORACIC ECHOCARDIOGRAM  07/2011   Moderate concentric LVH, mildly dilated. EF 25-30% no diastolic dysfunction parameters  to suggest elevated LAP  . 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 Normal wall thickness (compared to prior reading of possible apical LVH), grade 2 DD -- with elevated LV filling pressures (however left atrium was read as normal size), normal artery function and size. No no suggestion of pulmonary hypertension    Outpatient Encounter Prescriptions as of 08/16/2016  Medication Sig  . albuterol (VENTOLIN HFA) 108 (90 Base) MCG/ACT inhaler Inhale 1-2 puffs into the lungs every 6 (six) hours as needed for wheezing or shortness of breath.  . budesonide-formoterol (SYMBICORT) 160-4.5 MCG/ACT inhaler INHALE 2 PUFFS INTO THE LUNGS TWICE DAILY  . calcium carbonate (TUMS - DOSED IN MG ELEMENTAL CALCIUM) 500 MG chewable tablet Chew 1 tablet by mouth daily as needed for  indigestion or heartburn. Reported on 06/15/2015  . Cholecalciferol (VITAMIN D) 2000 units CAPS Take by mouth daily.  . cholestyramine light (PREVALITE) 4 GM/DOSE powder Take 4 g by mouth as needed (DIARRHEA DUE TO BILE ACIDS).   . clobetasol cream (TEMOVATE) 0.05 % Apply 1 application topically as needed (for Sclerosis).   . DULoxetine (CYMBALTA) 60 MG capsule TAKE 1 CAPSULE (60 MG TOTAL) BY MOUTH DAILY. (Patient taking differently: TAKE 2 CAPSULE (120 MG TOTAL) BY MOUTH DAILY.)  . ibuprofen (ADVIL,MOTRIN) 200 MG tablet Take 400 mg by mouth every 6 (six) hours as needed for moderate pain.  [name] levothyroxine (SYNTHROID, LEVOTHROID) 125 MCG tablet Take 1 tablet (125 mcg total) by mouth daily.  [name] LORazepam (ATIVAN) 1 MG tablet Take 0.5-1 tablets (0.5-1 mg total) by mouth 3 (three) times daily as needed for anxiety.  . metoprolol succinate (TOPROL-XL) 50 MG 24 hr tablet TAKE 1 TABLET (50 MG TOTAL) BY MOUTH DAILY. TAKE WITH OR IMMEDIATELY FOLLOWING A MEAL.  [name] potassium chloride SA (K-DUR,KLOR-CON) 20 MEQ tablet Take 2 tablets (40 mEq total) by mouth daily.  [name] spironolactone (ALDACTONE) 25 MG tablet TAKE 1 TABLET EVERY DAY  . Tiotropium Bromide Monohydrate (SPIRIVA RESPIMAT) 2.5 MCG/ACT AERS Inhale 2 puffs into the lungs daily.  [name] torsemide (DEMADEX) 20 MG tablet Take 2 tablets (40 mg total) by mouth daily. Alternating with 20 mg daily  . traZODone (DESYREL) 50 MG tablet Take 1/2-1 tablet by mouth in evening as needed for sleep  . valsartan (DIOVAN) 40 MG tablet TAKE 1 TABLET (40 MG TOTAL) BY MOUTH 2 (TWO) TIMES DAILY.  . clonazePAM (KLONOPIN) 1 MG tablet Take 1 tablet (1 mg total) by mouth 2 (two) times daily. (Patient not taking: Reported on 08/16/2016)  . dicyclomine (BENTYL) 20 MG tablet Take 1 tablet (20 mg total) by mouth as needed for spasms. (Patient not taking: Reported on 08/16/2016)  . [DISCONTINUED] torsemide (DEMADEX) 20 MG tablet TAKE 2 TABLETS EVERY DAY (Patient not taking: Reported on  08/16/2016)   No facility-administered encounter medications on file as of 08/16/2016.     Allergies  Allergen Reactions  . Lisinopril Swelling    Angioedema facial, mostly just lips, no trouble breathing    Current Medications, Allergies, Past Medical History, Past Surgical History, Family History, and Social History were reviewed in [employer] record.   Review of Systems             All symptoms NEG except where BOLDED >>  Constitutional:  Denies F/C/S, anorexia, unexpected weight change. HEENT:  No HA, visual changes, earache, nasal symptoms,  sore throat, hoarseness. Resp:  cough, sputum, hemoptysis; SOB, tightness, wheezing. Cardio:  CP, palpit, DOE, orthopnea, edema. GI:  Denies N/V/D/C or blood in stool; no reflux, abd pain, distention, or gas. GU:  No dysuria, freq, urgency, hematuria, or flank pain. MS:  Denies joint pain, swelling, tenderness, or decr ROM; no neck pain, back pain, etc. Neuro:  No tremors, seizures, dizziness, syncope, weakness, numbness, gait abn. Skin:  No suspicious lesions or skin rash. Heme:  No adenopathy, bruising, bleeding. Psyche: Denies confusion, sleep disturbance, hallucinations, anxiety, depression.   Objective:   Physical Exam    Vital Signs:  Reviewed...   General:  WD, overweight, 36 y/o WF in NAD; alert & oriented; pleasant & cooperative... HEENT:  [place]/AT; Conjunctiva- pink, Sclera- nonicteric, EOM-wnl, PERRLA, EACs-clear, TMs-wnl; NOSE-clear; THROAT-clear & wnl.  Neck:  Supple w/ fair ROM; no JVD; normal carotid impulses w/o bruits; no thyromegaly or nodules palpated; no lymphadenopathy.  Chest:  Clear to P & A; without wheezes, rales, or rhonchi heard (decr BS at bases) Heart:  Regular Rhythm; gr1/6 SEM, without rubs or gallops detected. Abdomen:  Obese, soft, nontender- no guarding or rebound; normal bowel sounds; no organomegaly or masses palpated. Ext:  Normal ROM; without deformities or arthritic changes; no  varicose veins, +venous insuffic, tr edema;  Pulses intact w/o bruits. Neuro:  No focal neuro deficits, gait normal & balance OK. Derm:  No lesions noted; no rash etc. Lymph:  No cervical, supraclavicular, axillary, or inguinal adenopathy palpated.   Assessment:      DYSPNEA>> multifactorial Restrictive lung dis- felt to be due to obesity & chest wall factors and not parenchymal pulm dis => continue Klonopin 1mg  Bid... OSA w/ nocturnal hypoxemia> mod AHI=20/hr (worse w/ REM & desat to 64%) on sleep study 08/2014 => now on CPAP Auto 5-15 w/ good compliance and AHI<1, ONO showed signif hypoxemia on the CPAP therefore Oxygen added Qhs... Hx eating disorder w/ anorexia => morbid obesity and she may benefit from psyche counseling again... Hx congenital HD w/ surg 1981, HBP, nonischemic cardiomyopathy w/ combined sys/diast CHF- followed & treated by [facility], DrHarding...    PLAN>>  She has a very complex medical situation as outlined above, and we are hampered by lack of old records (I have asked her to help in getting this data);  For now she will continue the Symbicort, Ventolin, Klonopin, & CPAP; She knows the importance of wt reduction & may want to consider counseling from Dr. Stephania Fragmin for eating disorder;  Continue cardiac meds and f/u w/ DrHarding;  She is only 36 y/o w/ signif pulm restriction & heart disease... 12/14/14> Jeslin will soon start cardiac rehab w/ it's exercise & nutritional counseling- it is imperitive that she get the weight down!!!  We will check CPAP download on full face mask & then ONO on the CPAP set up to be sure that she is not desaturating... ROV planned 3 months 03/17/15> She has severe pulmonary restriction- obesity, scoliosis, ankylosis;  Weight is down to 187# on diet & [facility] exercises;  rec to continue current meds and we will try Desyrel 25=50 Qhs for sleep. 06/20/15>  She will continue same meds, continue her diet & exercise at [facility] & keep record of O2sats  there; we refilled her Ativan to use prn for her panic attacks which are undoubtedly playing a roll in her dyspnea. 11/15/15>   She is getting B12 shots from her PCP- DrCrawford;  We discussed continuing the Symbicort160-2spBid & AlbutHFA  rescue inhaler prn; We are adding Incruse once daily; Reminded to use the IS frequently to expand the lungs w/ her chest wall restricted dis. 02/15/16>   Jeanean is stable on her current regimen- we reviewed regular dosing of her inhalers, taking meds regularly, continued [facility] rehab exercises and DIET for weight reduction;  She would like some Augmentin875 to keep on hand this winter- OK. 08/16/16>    Hayslee has stopped her CPAP & noct O2 on her own due to prob w/ mask intrerface & noice from the machine; she feels that she is doing fine off the CPAP but prev studies showed mod OSA & signif O2desat at night=> we will check ONO on RA (as she has been for the last 69mo) ASAP to see if she needs the O2 by [place] at night;  She is asked to get on diet, get wt down, [facility]...     Plan:     Patient's Medications  New Prescriptions   No medications on file  Previous Medications   ALBUTEROL (VENTOLIN HFA) 108 (90 BASE) MCG/ACT INHALER    Inhale 1-2 puffs into the lungs every 6 (six) hours as needed for wheezing or shortness of breath.   BUDESONIDE-FORMOTEROL (SYMBICORT) 160-4.5 MCG/ACT INHALER    INHALE 2 PUFFS INTO THE LUNGS TWICE DAILY   CALCIUM CARBONATE (TUMS - DOSED IN MG ELEMENTAL CALCIUM) 500 MG CHEWABLE TABLET    Chew 1 tablet by mouth daily as needed for indigestion or heartburn. Reported on 06/15/2015   CHOLECALCIFEROL (VITAMIN D) 2000 UNITS CAPS    Take by mouth daily.   CHOLESTYRAMINE LIGHT (PREVALITE) 4 GM/DOSE POWDER    Take 4 g by mouth as needed (DIARRHEA DUE TO BILE ACIDS).    CLOBETASOL CREAM (TEMOVATE) 0.05 %    Apply 1 application topically as needed (for Sclerosis).    CLONAZEPAM (KLONOPIN) 1 MG TABLET    Take 1 tablet (1 mg total) by mouth 2  (two) times daily.   DICYCLOMINE (BENTYL) 20 MG TABLET    Take 1 tablet (20 mg total) by mouth as needed for spasms.   DULOXETINE (CYMBALTA) 60 MG CAPSULE    TAKE 1 CAPSULE (60 MG TOTAL) BY MOUTH DAILY.   IBUPROFEN (ADVIL,MOTRIN) 200 MG TABLET    Take 400 mg by mouth every 6 (six) hours as needed for moderate pain.   LEVOTHYROXINE (SYNTHROID, LEVOTHROID) 125 MCG TABLET    Take 1 tablet (125 mcg total) by mouth daily.   LORAZEPAM (ATIVAN) 1 MG TABLET    Take 0.5-1 tablets (0.5-1 mg total) by mouth 3 (three) times daily as needed for anxiety.   METOPROLOL SUCCINATE (TOPROL-XL) 50 MG 24 HR TABLET    TAKE 1 TABLET (50 MG TOTAL) BY MOUTH DAILY. TAKE WITH OR IMMEDIATELY FOLLOWING A MEAL.   POTASSIUM CHLORIDE SA (K-DUR,KLOR-CON) 20 MEQ TABLET    Take 2 tablets (40 mEq total) by mouth daily.   SPIRONOLACTONE (ALDACTONE) 25 MG TABLET    TAKE 1 TABLET EVERY DAY   TIOTROPIUM BROMIDE MONOHYDRATE (SPIRIVA RESPIMAT) 2.5 MCG/ACT AERS    Inhale 2 puffs into the lungs daily.   TORSEMIDE (DEMADEX) 20 MG TABLET    Take 2 tablets (40 mg total) by mouth daily. Alternating with 20 mg daily   TRAZODONE (DESYREL) 50 MG TABLET    Take 1/2-1 tablet by mouth in evening as needed for sleep   VALSARTAN (DIOVAN) 40 MG TABLET    TAKE 1 TABLET (40 MG TOTAL) BY  MOUTH 2 (TWO) TIMES DAILY.  Modified Medications   No medications on file  Discontinued Medications   TORSEMIDE (DEMADEX) 20 MG TABLET    TAKE 2 TABLETS EVERY DAY

## 2016-08-16 NOTE — Patient Instructions (Signed)
Today we updated your med list in our EPIC system...    Continue your current medications the same...  We discussed checking an Overnight Oximetry (ONO) on room air, off the CPAP & we will call the results...    If the oxygen drops at night then we will apply oxygen at 2L/min via nasal cannula 7 repeat the oximetry test...    (we want to be sure that the oxygen is NOT dropping during the night).  Keep up the good work w/ diet + exercise...  Call for any questions...  Let's plan a follow up visit in 62PPikeville Medical CenteruBethany Medical Center [phone] DitMarion GenCleveland CliniceDo69mPLaser And Cataract Center Of Shreveport LLCuVa Medical Center And Ambulatory Care [phone] DitSpoonerHarbin Clinic LLC Do68mPSansum ClinicuDiamond Grove [phone] DitThe Medical CenteGenesis Medical Center AledorDo29mPSaint Francis Hospital SouthuConcord Eye [phone] DitFreehold Endoscopy AUp Health System PortagesDo65mPCape Cod Eye Surgery And Laser CenteruSaratoga Schenectady Endoscopy Center [phone] DitWillsTemecula Valley Hospital Do87mPSchick Shadel HosptialuThedacare Medical Center New [phone] 500Cheree DitSurgery Center Of Cherry Hill D B A Wills Surgery Center OOzarks Medical CenterfDo54mPWest Florida Community Care CenteruRegional Health Lead-Deadwood [phone] DitDoris Miller Department Of Veterans Affairs MCleveland Center For DigestiveeDo11mPHunter Holmes Mcguire Va Medical CenteruCentro De Salud Susana Centeno - [phone] DitJefferson County Tanner Medical Center - CarrolltonDo64mPWise Health Surgecal HospitaluBeacon Children'S [phone] DitValir Rehabilitation HoLifecare Hospitals Of South Texas - Mcallen SouthsDo30mPGreystone Park Psychiatric HospitaluMonongahela Valley [phone] DitCharlton MemoSsm St. Joseph Hospital WestrDo66mPUh Canton Endoscopy LLCuFlorida Outpatient Surgery Center [phone] DitSanford Aberdeen MEunice Extended Care HospitaleDo84mPSt Petersburg Endoscopy Center LLCuAndroscoggin Valley [phone] DitAlbany MWoodcrest Surgery CentereDo70mPKindred Hospital-Bay Area-St PetersburguWake Forest Endoscopy [phone] DitMerritt Island Outpatient SSurgery Center Of Wasilla LLCuDo49mPCharleston Va Medical CenteruBlue Mountain Hospital Gnaden [phone] DitEastern Oregon RegNorman Regional HealthplexiDo40mPSan Francisco Va Health Care SystemuCrittenton Children'S [phone] DitSouthwestern Virginia Mental HeaLee Island Coast Surgery CenterlDo79mPSt. Rose Dominican Hospitals - Siena CampusuFawcett Memorial [phone] DitRogBaylor Nyko Gell & White Medical Center - College StationeDo72mPCharleston Ent Associates LLC Dba Surgery Center Of CharlestonuAllied Services Rehabilitation [phone] DitBeaumont Surgery Center LLC Dba [place] SuThe Surgery Center Of AthensrDo75mPWoodstock Endoscopy CenteruBerkshire Eye [phone] DitCalcasieu Oaks PsychiaHermann Area District HospitaltDo37mPPoole Endoscopy Center LLCuCape Fear Valley - Bladen County [phone] DitReno Behavioral HealthEye Care Specialists PscDo50mPBlessing HospitaluEastern State [phone] 580Cheree DitConnecticut Eye SurgeryWoodridge Psychiatric Hospital Do84mPMemorial Hospital Of Converse CountyuMuscogee (Creek) Nation Long Term Acute Care [phone] DitCentral SGoodland Regional Medical CentertDo38mPMentor Surgery Center LtduSan Antonio Surgicenter [phone] DitSt Charles Hospital And RehabiliBrass Partnership In Commendam Dba Brass Surgery CentertDo79mPWoods At Parkside,TheuAgmg Endoscopy Center A General [phone] DitMemorial HoCypress Pointe Surgical HospitalsDo38mPHurst Ambulatory Surgery Center LLC Dba Precinct Ambulatory Surgery Center LLCuAlton Memorial [phone] DitHospital Of The University OfCincinnati Eye Institute Do26mPAlleghany Memorial HospitaluCherokee Indian Hospital [phone] DitWaterford SurgicFlatirons Surgery Center LLCaDo48mPAshford Presbyterian Community Hospital IncuCrozer-Chester Medical [phone] DitUniversity Of Miami Hospital And Clinics-Bascom PaFaulkner HospitallDo69mPApple Surgery CenteruEssex Endoscopy Center Of Nj [phone] DitCarrSt John'S Episcopal Hospital South ShoreiDo72mPKessler Institute For Rehabilitation - West OrangeuWahiawa General [phone] DitClarinda Regional Hughes Spalding Children'S HospitalDo49mPSanford Medical Center FargouAdvanced Surgery Center Of Palm Beach County [phone] 601Cheree DitPenn State Hershey EndoscoWillow Creek Behavioral HealthpDo87mPEl Paso Center For Gastrointestinal Endoscopy LLCuAdvanced Pain [phone] DitAbrazo CPhs Indian Hospital RosebudeDo90mPBroadwest Specialty Surgical Center LLCuLakewalk Surgery [phone] DitMercy Hospital - Mercy Hospital Orchard Highland HospitalPDo16mPOaklawn Psychiatric Center IncuNassau University Medical [phone] DitSouth Central SurgeBaylor Mariangel Ringley And White PavilionrDo57mPFairmont HospitaluAbbott Northwestern [phone] DitSouth SSyracuse Surgery Center LLChDo8mPEncompass Health Rehabilitation Hospital Of GadsdenuDodge County [phone] 394Cheree DitKerrville SWillow Lane InfirmarytDo52mPRenown South Meadows Medical CenteruThe Endoscopy Center Of Northeast [phone] DitSwisher MemoBrooks Memorial HospitalrDo73mPAdventhealth East OrlandouVal Verde Regional Medical [phone] DitPort Jefferson SNorth Ms State HospitaluDo56mPRockland And Bergen Surgery Center LLCuAllied Physicians Surgery Center [phone] DitAcuity Hospital OArrowhead Behavioral HealthfDo65mPUniversity Of Cincinnati Medical Center, LLCuNorthern Navajo Medical [phone] DitSt Vincent SalemPaoli Hospital Do2mPSaint Luke'S Northland Hospital - Barry RoaduNortheast Georgia Medical Center, I937 [phone] 273Cheree DitBaptist OrChevy Chase Endoscopy CenteraDo10mPMcleod SeacoastuDesert View Endoscopy Center [phone] DitWinnie Community Hospital Dba Riceland SNorth Canyon Medical CenteruDo68mPHenry J. Carter Specialty HospitaluCameron Regional Medical [phone] DitSaint Joseph Hospital -Laredo Digestive University Endoscopy Centerter LLC Doro 

## 2016-08-17 ENCOUNTER — Encounter (HOSPITAL_COMMUNITY): Payer: Commercial Managed Care - PPO

## 2016-08-19 DIAGNOSIS — G4733 Obstructive sleep apnea (adult) (pediatric): Secondary | ICD-10-CM | POA: Diagnosis not present

## 2016-08-20 ENCOUNTER — Encounter (HOSPITAL_COMMUNITY): Payer: Commercial Managed Care - PPO

## 2016-08-22 ENCOUNTER — Encounter (HOSPITAL_COMMUNITY): Payer: Commercial Managed Care - PPO

## 2016-08-24 ENCOUNTER — Encounter (HOSPITAL_COMMUNITY): Payer: Commercial Managed Care - PPO

## 2016-08-27 ENCOUNTER — Encounter (HOSPITAL_COMMUNITY): Payer: Commercial Managed Care - PPO

## 2016-08-29 ENCOUNTER — Encounter (HOSPITAL_COMMUNITY): Payer: Commercial Managed Care - PPO

## 2016-08-31 ENCOUNTER — Encounter (HOSPITAL_COMMUNITY): Payer: Commercial Managed Care - PPO

## 2016-09-03 ENCOUNTER — Encounter (HOSPITAL_COMMUNITY): Payer: Commercial Managed Care - PPO

## 2016-09-05 ENCOUNTER — Encounter (HOSPITAL_COMMUNITY): Payer: Commercial Managed Care - PPO

## 2016-09-07 ENCOUNTER — Encounter (HOSPITAL_COMMUNITY): Payer: Commercial Managed Care - PPO

## 2016-09-10 ENCOUNTER — Encounter (HOSPITAL_COMMUNITY): Payer: Commercial Managed Care - PPO

## 2016-09-12 ENCOUNTER — Encounter (HOSPITAL_COMMUNITY): Payer: Commercial Managed Care - PPO

## 2016-09-14 ENCOUNTER — Encounter (HOSPITAL_COMMUNITY): Payer: Commercial Managed Care - PPO

## 2016-09-17 ENCOUNTER — Other Ambulatory Visit (HOSPITAL_COMMUNITY): Payer: Self-pay | Admitting: Cardiology

## 2016-09-17 ENCOUNTER — Encounter (HOSPITAL_COMMUNITY): Payer: Commercial Managed Care - PPO

## 2016-09-18 DIAGNOSIS — G4733 Obstructive sleep apnea (adult) (pediatric): Secondary | ICD-10-CM | POA: Diagnosis not present

## 2016-09-19 ENCOUNTER — Encounter (HOSPITAL_COMMUNITY): Payer: Commercial Managed Care - PPO

## 2016-09-21 ENCOUNTER — Encounter (HOSPITAL_COMMUNITY): Payer: Commercial Managed Care - PPO

## 2016-09-24 ENCOUNTER — Encounter (HOSPITAL_COMMUNITY): Payer: Commercial Managed Care - PPO

## 2016-09-26 ENCOUNTER — Encounter (HOSPITAL_COMMUNITY): Payer: Commercial Managed Care - PPO

## 2016-09-28 ENCOUNTER — Encounter (HOSPITAL_COMMUNITY): Payer: Commercial Managed Care - PPO

## 2016-10-02 ENCOUNTER — Ambulatory Visit (INDEPENDENT_AMBULATORY_CARE_PROVIDER_SITE_OTHER): Payer: Commercial Managed Care - PPO | Admitting: Family Medicine

## 2016-10-02 ENCOUNTER — Ambulatory Visit: Payer: Commercial Managed Care - PPO | Admitting: Internal Medicine

## 2016-10-02 ENCOUNTER — Ambulatory Visit (INDEPENDENT_AMBULATORY_CARE_PROVIDER_SITE_OTHER)
Admission: RE | Admit: 2016-10-02 | Discharge: 2016-10-02 | Disposition: A | Discharge status: Home / Self Care | Payer: Commercial Managed Care - PPO | Source: Ambulatory Visit | Attending: Family Medicine | Admitting: Family Medicine

## 2016-10-02 ENCOUNTER — Encounter: Payer: Self-pay | Admitting: Family Medicine

## 2016-10-02 VITALS — BP 110/78 | HR 90 | Temp 98.6°F | Ht 60.0 in | Wt 206.6 lb

## 2016-10-02 DIAGNOSIS — R0602 Shortness of breath: Secondary | ICD-10-CM

## 2016-10-02 DIAGNOSIS — R1010 Upper abdominal pain, unspecified: Secondary | ICD-10-CM

## 2016-10-02 LAB — COMPREHENSIVE METABOLIC PANEL
ALT: 18 U/L (ref 0–35)
AST: 13 U/L (ref 0–37)
Albumin: 4.2 g/dL (ref 3.5–5.2)
Alkaline Phosphatase: 110 U/L (ref 39–117)
BUN: 17 mg/dL (ref 6–23)
CALCIUM: 9.3 mg/dL (ref 8.4–10.5)
CHLORIDE: 100 meq/L (ref 96–112)
CO2: 33 mEq/L — ABNORMAL HIGH (ref 19–32)
CREATININE: 0.93 mg/dL (ref 0.40–1.20)
GFR: 72.24 mL/min (ref >60.00)
Glucose, Bld: 112 mg/dL — ABNORMAL HIGH (ref 70–99)
Potassium: 4 mEq/L (ref 3.5–5.1)
Sodium: 140 mEq/L (ref 135–145)
Total Bilirubin: 0.4 mg/dL (ref 0.2–1.2)
Total Protein: 7.7 g/dL (ref 6.0–8.3)

## 2016-10-02 LAB — CBC WITH DIFFERENTIAL/PLATELET
BASOS PCT: 0.7 % (ref 0.0–3.0)
Basophils Absolute: 0.1 10*3/uL (ref 0.0–0.1)
EOS ABS: 0.2 10*3/uL (ref 0.0–0.7)
EOS PCT: 1.4 % (ref 0.0–5.0)
HEMATOCRIT: 41.6 % (ref 36.0–46.0)
HEMOGLOBIN: 13.9 g/dL (ref 12.0–15.0)
LYMPHS PCT: 26.6 % (ref 12.0–46.0)
Lymphs Abs: 3.7 10*3/uL (ref 0.7–4.0)
MCHC: 33.5 g/dL (ref 30.0–36.0)
MCV: 89.5 fl (ref 78.0–100.0)
Monocytes Absolute: 1.1 10*3/uL — ABNORMAL HIGH (ref 0.1–1.0)
Monocytes Relative: 7.9 % (ref 3.0–12.0)
Neutro Abs: 8.8 10*3/uL — ABNORMAL HIGH (ref 1.4–7.7)
Neutrophils Relative %: 63.4 % (ref 43.0–77.0)
Platelets: 445 10*3/uL — ABNORMAL HIGH (ref 150.0–400.0)
RBC: 4.64 Mil/uL (ref 3.87–5.11)
RDW: 14 % (ref 11.5–15.5)
WBC: 13.9 10*3/uL — AB (ref 4.0–10.5)

## 2016-10-02 LAB — BRAIN NATRIURETIC PEPTIDE: PRO B NATRI PEPTIDE: 68 pg/mL (ref 0.0–100.0)

## 2016-10-02 LAB — LIPASE: Lipase: 16 U/L (ref 11.0–59.0)

## 2016-10-02 MED ORDER — TRAMADOL HCL 50 MG PO TABS: 50.0000 mg | ORAL_TABLET | Freq: Four times a day (QID) | ORAL | 0 refills | Status: DC | PRN

## 2016-10-02 NOTE — Progress Notes (Signed)
Subjective:  Meredith Diaz is a 36 y.o. year old very pleasant female patient who presents for/with See problem oriented charting ROS- no fever, chills. Some nausea and decreased appetite. Has had weight gain.    Past Medical History-  Patient Active Problem List   Diagnosis Date Noted  . Nocturnal hypoxemia 08/16/2016  . Fatigue 03/16/2016  . Sinusitis, acute 01/13/2016  . Obesity 11/15/2015  . Left lumbar radiculopathy 10/12/2015  . Contusion of left knee 10/12/2015  . 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 B12 deficiency 09/26/2015  . Tingling in extremities 09/26/2015  . Elevated WBC count 06/17/2015  . Benign paroxysmal positional vertigo 01/29/2015  . Adjustment disorder with mixed anxiety and depressed mood 01/13/2015  . NICM (nonischemic cardiomyopathy) (HCC) 12/03/2014  . Atypical chest pain 12/03/2014  . Idiopathic scoliosis 10/14/2014  . OSA on CPAP 10/14/2014  . Dyspnea 08/16/2014  . Restrictive lung disease 08/16/2014  . Cardiomyopathy (HCC)   . Chronic combined systolic and diastolic heart failure, NYHA class 3 (HCC) 03/09/2014  . Essential hypertension 03/09/2014  . Congenital heart disease, adult   . Hypothyroidism 01/28/2014  . Birth control 01/28/2014  . Asthma, chronic 01/28/2014    Medications- reviewed and updated Current Outpatient Prescriptions  Medication Sig Dispense Refill  . albuterol (VENTOLIN HFA) 108 (90 Base) MCG/ACT inhaler Inhale 1-2 puffs into the lungs every 6 (six) hours as needed for wheezing or shortness of breath. 18 Inhaler 11  . budesonide-formoterol (SYMBICORT) 160-4.5 MCG/ACT inhaler INHALE 2 PUFFS INTO THE LUNGS TWICE DAILY 10.2 Inhaler 11  . calcium carbonate (TUMS - DOSED IN MG ELEMENTAL CALCIUM) 500 MG chewable tablet Chew 1 tablet by mouth daily as needed for indigestion or heartburn. Reported on 06/15/2015    . Cholecalciferol (VITAMIN D) 2000 units CAPS Take by mouth daily.    . cholestyramine light (PREVALITE) 4  GM/DOSE powder Take 4 g by mouth as needed (DIARRHEA DUE TO BILE ACIDS).     . clobetasol cream (TEMOVATE) 0.05 % Apply 1 application topically as needed (for Sclerosis).     . DULoxetine (CYMBALTA) 60 MG capsule TAKE 1 CAPSULE (60 MG TOTAL) BY MOUTH DAILY. (Patient taking differently: TAKE 2 CAPSULE (120 MG TOTAL) BY MOUTH DAILY.) 30 capsule 6  . ibuprofen (ADVIL,MOTRIN) 200 MG tablet Take 400 mg by mouth every 6 (six) hours as needed for moderate pain.    [name] levothyroxine (SYNTHROID, LEVOTHROID) 125 MCG tablet Take 1 tablet (125 mcg total) by mouth daily. 90 tablet 3  . LORazepam (ATIVAN) 1 MG tablet Take 0.5-1 tablets (0.5-1 mg total) by mouth 3 (three) times daily as needed for anxiety. 90 tablet 0  . metoprolol succinate (TOPROL-XL) 50 MG 24 hr tablet TAKE 1 TABLET (50 MG TOTAL) BY MOUTH DAILY. TAKE WITH OR IMMEDIATELY FOLLOWING A MEAL. 90 tablet 3  . potassium chloride SA (K-DUR,KLOR-CON) 20 MEQ tablet Take 2 tablets (40 mEq total) by mouth daily. 60 tablet 3  . spironolactone (ALDACTONE) 25 MG tablet TAKE 1 TABLET EVERY DAY 30 tablet 6  . Tiotropium Bromide Monohydrate (SPIRIVA RESPIMAT) 2.5 MCG/ACT AERS Inhale 2 puffs into the lungs daily. 4 g 4  . torsemide (DEMADEX) 20 MG tablet Take 2 tablets (40 mg total) by mouth daily. Alternating with 20 mg daily 60 tablet 3  . traZODone (DESYREL) 50 MG tablet Take 1/2-1 tablet by mouth in evening as needed for sleep 30 tablet 5  . valsartan (DIOVAN) 40 MG tablet TAKE 1 TABLET (40 MG TOTAL) BY  MOUTH 2 (TWO) TIMES DAILY. 60 tablet 6   Objective: BP 110/78 (BP Location: Left Arm, Patient Position: Sitting, Cuff Size: Large)   Pulse 90   Temp 98.6 F (37 C) (Oral)   Ht 5' (1.524 m)   Wt 206 lb 9.6 oz (93.7 kg)   LMP 12/27/2015 (Exact Date)   SpO2 94%   BMI 40.35 kg/m  Gen: NAD, resting comfortably CV: RRR no murmurs rubs or gallops Lungs: CTAB no crackles, wheeze, rhonchi. Has a hard time taking a deep breath due to pain.  Abdomen: Patient is  very tender in left upper quadrant and involuntarily guards. She also has less intense but still significant pain with palpation up to under left breast and around into back in similar dermatomal distribution without rash. Nondistended but morbidly obese. Normal bowel sounds. No rebound.  Ext: trace edema, puffiness in hands Skin: warm, dry Neuro: grossly normal, moves all extremities  EKG- rate 76 sinus rhythm, regular axis, normal intervals, no hypertorphy, no st or t wave changes. EKG stable compared with 01/27/15. I disagree with reading of negative precordial leads as all t waves are in direction of qrs complex in this distribution.   Assessment/Plan:  Upper abdominal pain - Plan: EKG 12-Lead, CBC with Differential/Platelet, Comprehensive metabolic panel, Lipase, DG Chest 2 View, CT Abdomen Pelvis W Contrast  Shortness of breath - Plan: Brain Natriuretic Peptide, DG Chest 2 View S: 2-3 days of symptoms. Worsening since onset.  LUQ pain 7/10 pain constant. Now wrapping around to back under shoulder blade. Hurts to take deep breaths at first now a constant pain. No rash in that area. Did have chicken pox as a child. Pain even with lifting arm overhead. Pain with walking, breathing, moving.   Husband had to help her get dressed and states he cannot remember last time she had this significant of pain. Other than when diagnosed with CHF, started with pain in left back and then came forwards. Compliant with torsemide 40, 20, 40, 20mg  alternating days and spironolactone. Admits to being more short of breath more than normal but has baseline shortness of breath. Highest weight has been in sometime- she thinks 8 lbs in about 2 weeks. Some swelling in legs. Hands puffier  Very mild nausea when pain is severe. Mild decreased appetite. No vomiting. S/p cholecystectomy has always had mild Gi upset. Alternates between constipation and diarrhea as per her baseline.   A/P: 36 year old famale with CHF history  presents with LUQ pain but also pain above this thatt wraps to her back in dermatomal distribution.   EKG reassuring. No rash to suggest shingles (though still possible). Odd history of pain with onset of CHF but BNP on stat labs not elevated. Given weight gain and edema- increase lasix short term as per after visit summary and advise cardiology follow up. CBC stable with leukocytosis at baseline which she has been referred to rheumatology in the past. Lipase normal. Pending radiology read of chext x-ray. We will get a stat CT tomorrow morning (could not do today without ER visit given needing creatinine first). If no etiology found will need to be evaluated again likely by PCP Dr. Okey Dupre. Other considerations but think less likely would be pulmonary embolism (given pain with palpation in LUQ think less likely, no obvious signs of DVT. Will give tramadol for pain control for now.   Close follow up will be needed with severity of pain.   Orders Placed This Encounter  Procedures  .  DG Chest 2 View    Standing Status:   Future    Number of Occurrences:   1    Standing Expiration Date:   12/02/2017    Order Specific Question:   Reason for Exam (SYMPTOM  OR DIAGNOSIS REQUIRED)    Answer:   shortnes of breath, CHF history, significant LUQ pain wrapping around to back. possible shingles but no rash.    Order Specific Question:   Preferred imaging location?    Answer:   [facility]    Order Specific Question:   Is patient pregnant?    Answer:   No    Comments:   IUD  . CT Abdomen Pelvis W Contrast    SS. Stanton Kidney [phone] xt 2251/ UMR/ Not Diabetic/no labs req    Standing Status:   Future    Standing Expiration Date:   01/02/2018    Order Specific Question:   If indicated for the ordered procedure, I authorize the administration of contrast media per Radiology protocol    Answer:   Yes    Order Specific Question:   Reason for Exam (SYMPTOM  OR DIAGNOSIS REQUIRED)    Answer:   LUQ pain, radiating  to back. History of back pain with CHF flare.    Order Specific Question:   Is patient pregnant?    Answer:   No    Comments:   IUD    Order Specific Question:   Preferred imaging location?    Answer:   [place] [facility]    Order Specific Question:   Radiology Contrast Protocol - do NOT remove file path    Answer:   [email or web address]  . CBC with Differential/Platelet  . Comprehensive metabolic panel    [facility]  . Lipase  . Brain Natriuretic Peptide  . EKG 12-Lead    Order Specific Question:   Where should this test be performed    Answer:   Other    Meds ordered this encounter  Medications  . traMADol (ULTRAM) 50 MG tablet    Sig: Take 1 tablet (50 mg total) by mouth every 6 (six) hours as needed for moderate pain or severe pain.    Dispense:  20 tablet    Refill:  0    Return precautions advised.  Tana Conch, MD

## 2016-10-02 NOTE — Patient Instructions (Signed)
Stat labs today. If you have worsening pain or I have significant concern from labs then I am going to ask you to go to the emergency room  Please go to [facility] - located [address] across the street from [place] - in the basement - Hours: 8:30-5:30 PM M-F. Do not need appointment.   Stat CT scan- hopeful this can be done tomorrow morning- sit tight until scheduled  Take lasix 20mg  twice a day today, 40mg  tomorrow morning and an additional 20mg  6 hours later tomorrow, go back to 40 mg the next day. Try to see the heart failure clinic this week if possible for their opinion

## 2016-10-03 ENCOUNTER — Ambulatory Visit (INDEPENDENT_AMBULATORY_CARE_PROVIDER_SITE_OTHER)
Admission: RE | Admit: 2016-10-03 | Discharge: 2016-10-03 | Disposition: A | Discharge status: Home / Self Care | Payer: Commercial Managed Care - PPO | Source: Ambulatory Visit | Attending: Family Medicine | Admitting: Family Medicine

## 2016-10-03 ENCOUNTER — Encounter (HOSPITAL_COMMUNITY): Payer: Commercial Managed Care - PPO

## 2016-10-03 DIAGNOSIS — R109 Unspecified abdominal pain: Secondary | ICD-10-CM | POA: Diagnosis not present

## 2016-10-03 DIAGNOSIS — R1010 Upper abdominal pain, unspecified: Secondary | ICD-10-CM | POA: Diagnosis not present

## 2016-10-03 MED ORDER — IOPAMIDOL (ISOVUE-300) INJECTION 61%: 100.0000 mL | Freq: Once | INTRAVENOUS | Status: DC | PRN

## 2016-10-05 ENCOUNTER — Encounter (HOSPITAL_COMMUNITY): Payer: Commercial Managed Care - PPO

## 2016-10-07 ENCOUNTER — Other Ambulatory Visit: Payer: Self-pay | Admitting: Internal Medicine

## 2016-10-08 ENCOUNTER — Other Ambulatory Visit (INDEPENDENT_AMBULATORY_CARE_PROVIDER_SITE_OTHER): Payer: Commercial Managed Care - PPO

## 2016-10-08 ENCOUNTER — Encounter (HOSPITAL_COMMUNITY): Payer: Commercial Managed Care - PPO

## 2016-10-08 ENCOUNTER — Ambulatory Visit (INDEPENDENT_AMBULATORY_CARE_PROVIDER_SITE_OTHER): Payer: Commercial Managed Care - PPO | Admitting: Internal Medicine

## 2016-10-08 ENCOUNTER — Encounter: Payer: Self-pay | Admitting: Internal Medicine

## 2016-10-08 VITALS — BP 136/86 | HR 90 | Temp 97.6°F | Resp 14 | Ht 60.0 in | Wt 205.0 lb

## 2016-10-08 DIAGNOSIS — J4530 Mild persistent asthma, uncomplicated: Secondary | ICD-10-CM | POA: Diagnosis not present

## 2016-10-08 DIAGNOSIS — R071 Chest pain on breathing: Secondary | ICD-10-CM

## 2016-10-08 DIAGNOSIS — M791 Myalgia, unspecified site: Secondary | ICD-10-CM

## 2016-10-08 LAB — TSH: TSH: 18.07 u[IU]/mL — ABNORMAL HIGH (ref 0.35–4.50)

## 2016-10-08 LAB — MAGNESIUM: MAGNESIUM: 2.4 mg/dL (ref 1.5–2.5)

## 2016-10-08 LAB — CBC
HEMATOCRIT: 44.6 % (ref 36.0–46.0)
HEMOGLOBIN: 15.1 g/dL — AB (ref 12.0–15.0)
MCHC: 33.9 g/dL (ref 30.0–36.0)
MCV: 89.2 fl (ref 78.0–100.0)
Platelets: 430 10*3/uL — ABNORMAL HIGH (ref 150.0–400.0)
RBC: 5 Mil/uL (ref 3.87–5.11)
RDW: 13.9 % (ref 11.5–15.5)
WBC: 17 10*3/uL — AB (ref 4.0–10.5)

## 2016-10-08 LAB — SEDIMENTATION RATE: SED RATE: 32 mm/h — AB (ref 0–20)

## 2016-10-08 LAB — TROPONIN I: TNIDX: 0.01 ug/l (ref 0.00–0.06)

## 2016-10-08 LAB — CK: CK TOTAL: 47 U/L (ref 7–177)

## 2016-10-08 LAB — T4, FREE: FREE T4: 0.71 ng/dL (ref 0.60–1.60)

## 2016-10-08 MED ORDER — CYCLOBENZAPRINE HCL 5 MG PO TABS: 5.0000 mg | ORAL_TABLET | Freq: Three times a day (TID) | ORAL | 1 refills | Status: DC | PRN

## 2016-10-08 MED ORDER — HYDROCODONE-ACETAMINOPHEN 5-325 MG PO TABS: 1.0000 | ORAL_TABLET | Freq: Four times a day (QID) | ORAL | 0 refills | Status: DC | PRN

## 2016-10-08 NOTE — Progress Notes (Signed)
   Subjective:    Patient ID: Meredith Diaz, female    DOB: 09-28-1980, 36 y.o.   MRN: [id number]  HPI The patient is a 36 YO female coming in for follow up of left upper abdomen pain and back pain. Started about 1-2 weeks ago and seen about 1 week ago. She had some blood work checked as well as CT abdomen and pelvis which was without cause for the pain. She was given tramadol which did not help so she stopped taking. She is not able to do any activity with SOB and pain which is not usual for her. Weight is stable from prior. She is also having some diffuse myalgias which are new for her. Denies fevers or chills. Pain does not change with eating although her appetite has been less. Denies nausea or vomiting. No diarrhea or constipation. She is having pain with deep breathing and is mostly breathing very shallow which is unusual for her. She did take trip to [place] but denies other long travel recently. No change to her meds recently. No increase in swelling to her feet or ankles.   PMH, [facility], social history reviewed and updated.   Review of Systems  Constitutional: Positive for activity change, appetite change and fatigue. Negative for chills, fever and unexpected weight change.  HENT: Negative.   Eyes: Negative.   Respiratory: Positive for chest tightness and shortness of breath. Negative for apnea, cough and wheezing.   Cardiovascular: Negative for chest pain, palpitations and leg swelling.  Gastrointestinal: Positive for abdominal pain. Negative for abdominal distention, anal bleeding, blood in stool, constipation, diarrhea, nausea and vomiting.  Musculoskeletal: Positive for arthralgias, back pain and myalgias.  Skin: Negative.   Neurological: Positive for weakness. Negative for dizziness, syncope, light-headedness and headaches.  Psychiatric/Behavioral: Positive for sleep disturbance. The patient is nervous/anxious.       Objective:   Physical Exam  Constitutional: She is oriented to  person, place, and time. She appears well-developed and well-nourished.  HENT:  Head: Normocephalic and atraumatic.  Right Ear: External ear normal.  Left Ear: External ear normal.  Mouth/Throat: Oropharynx is clear and moist.  Eyes: EOM are normal.  Neck: Normal range of motion. No JVD present.  Cardiovascular: Normal rate and regular rhythm.   Pulmonary/Chest: Breath sounds normal. No respiratory distress. She has no wheezes. She has no rales.  Very shallow breathing due to pain  Abdominal: Soft. Bowel sounds are normal. She exhibits no distension and no mass. There is tenderness. There is no rebound and no guarding.  Musculoskeletal: She exhibits tenderness. She exhibits no edema.  Pain LUQ and left flank as well as left scapular region  Lymphadenopathy:    She has no cervical adenopathy.  Neurological: She is alert and oriented to person, place, and time. Coordination normal.  Skin: Skin is warm and dry.  Psychiatric:  Somewhat anxious   Vitals:   10/08/16 0821  BP: 136/86  Pulse: 90  Resp: 14  Temp: 97.6 F (36.4 C)  TempSrc: Oral  SpO2: 95%  Weight: 205 lb (93 kg)  Height: 5' (1.524 m)      Assessment & Plan:

## 2016-10-08 NOTE — Patient Instructions (Signed)
We are going to check some blood work today and call you back with the results.   We have given you the vicodin for pain relief as well as the muscle relaxer flexeril.

## 2016-10-09 ENCOUNTER — Other Ambulatory Visit: Payer: Self-pay | Admitting: Internal Medicine

## 2016-10-09 ENCOUNTER — Encounter: Payer: Self-pay | Admitting: Internal Medicine

## 2016-10-09 DIAGNOSIS — M791 Myalgia, unspecified site: Secondary | ICD-10-CM | POA: Insufficient documentation

## 2016-10-09 DIAGNOSIS — R071 Chest pain on breathing: Secondary | ICD-10-CM

## 2016-10-09 DIAGNOSIS — R0602 Shortness of breath: Secondary | ICD-10-CM

## 2016-10-09 LAB — D-DIMER, QUANTITATIVE: D-Dimer, Quant: 1.35 mcg/mL FEU — ABNORMAL HIGH (ref <0.50)

## 2016-10-09 NOTE — Assessment & Plan Note (Signed)
Needs d-dimer given the history and lack of explanation in the abdomen. Symptoms are not classical for GERD symptoms. She does have morbid obesity and relative recent inactivity. No known cancer but some chronically elevated WBC without etiology. Also will check troponin, ESR, thyroid panel, CK, CBC, magnesium. Rx for flexeril and vicodin for pain control and management.

## 2016-10-09 NOTE — Assessment & Plan Note (Signed)
No signs of flare on exam. She is breathing very shallow on exam due to pain making exam limited.

## 2016-10-09 NOTE — Assessment & Plan Note (Signed)
Checking ESR and CK to rule out myositis causing the sudden worsening pain and lack of function on exam. She does have vague rheumatologic workup in the past without etiology for elevated levels and WBC.

## 2016-10-10 ENCOUNTER — Encounter (HOSPITAL_COMMUNITY): Payer: Commercial Managed Care - PPO

## 2016-10-10 ENCOUNTER — Ambulatory Visit (INDEPENDENT_AMBULATORY_CARE_PROVIDER_SITE_OTHER)
Admission: RE | Admit: 2016-10-10 | Discharge: 2016-10-10 | Disposition: A | Discharge status: Home / Self Care | Payer: Commercial Managed Care - PPO | Source: Ambulatory Visit | Attending: Internal Medicine | Admitting: Internal Medicine

## 2016-10-10 DIAGNOSIS — R079 Chest pain, unspecified: Secondary | ICD-10-CM

## 2016-10-10 DIAGNOSIS — R0602 Shortness of breath: Secondary | ICD-10-CM | POA: Diagnosis not present

## 2016-10-10 DIAGNOSIS — R071 Chest pain on breathing: Secondary | ICD-10-CM

## 2016-10-10 MED ORDER — IOPAMIDOL (ISOVUE-370) INJECTION 76%
80.0000 mL | Freq: Once | INTRAVENOUS | Status: AC | PRN
  Administered 2016-10-10: 80 mL via INTRAVENOUS

## 2016-10-12 ENCOUNTER — Encounter (HOSPITAL_COMMUNITY): Payer: Commercial Managed Care - PPO

## 2016-10-15 ENCOUNTER — Encounter (HOSPITAL_COMMUNITY): Payer: Commercial Managed Care - PPO

## 2016-10-15 DIAGNOSIS — Z01419 Encounter for gynecological examination (general) (routine) without abnormal findings: Secondary | ICD-10-CM | POA: Diagnosis not present

## 2016-10-17 ENCOUNTER — Encounter (HOSPITAL_COMMUNITY): Payer: Commercial Managed Care - PPO

## 2016-10-19 ENCOUNTER — Encounter (HOSPITAL_COMMUNITY): Payer: Commercial Managed Care - PPO

## 2016-10-19 DIAGNOSIS — G4733 Obstructive sleep apnea (adult) (pediatric): Secondary | ICD-10-CM | POA: Diagnosis not present

## 2016-10-22 ENCOUNTER — Encounter (HOSPITAL_COMMUNITY): Payer: Commercial Managed Care - PPO

## 2016-10-24 ENCOUNTER — Encounter: Payer: Self-pay | Admitting: Internal Medicine

## 2016-10-24 ENCOUNTER — Encounter (HOSPITAL_COMMUNITY): Payer: Commercial Managed Care - PPO

## 2016-10-26 ENCOUNTER — Encounter (HOSPITAL_COMMUNITY): Payer: Commercial Managed Care - PPO

## 2016-10-29 ENCOUNTER — Encounter (HOSPITAL_COMMUNITY): Payer: Commercial Managed Care - PPO

## 2016-10-31 ENCOUNTER — Encounter (HOSPITAL_COMMUNITY): Payer: Commercial Managed Care - PPO

## 2016-11-02 ENCOUNTER — Encounter (HOSPITAL_COMMUNITY): Payer: Commercial Managed Care - PPO

## 2016-11-05 ENCOUNTER — Encounter (HOSPITAL_COMMUNITY): Payer: Commercial Managed Care - PPO

## 2016-11-09 ENCOUNTER — Encounter (HOSPITAL_COMMUNITY): Payer: Commercial Managed Care - PPO

## 2016-11-12 ENCOUNTER — Encounter (HOSPITAL_COMMUNITY): Payer: Commercial Managed Care - PPO

## 2016-11-14 ENCOUNTER — Encounter (HOSPITAL_COMMUNITY): Payer: Commercial Managed Care - PPO

## 2016-11-16 ENCOUNTER — Encounter (HOSPITAL_COMMUNITY): Payer: Commercial Managed Care - PPO

## 2016-11-18 DIAGNOSIS — G4733 Obstructive sleep apnea (adult) (pediatric): Secondary | ICD-10-CM | POA: Diagnosis not present

## 2016-11-19 ENCOUNTER — Encounter (HOSPITAL_COMMUNITY): Payer: Commercial Managed Care - PPO

## 2016-11-21 ENCOUNTER — Encounter (HOSPITAL_COMMUNITY): Payer: Commercial Managed Care - PPO

## 2016-11-23 ENCOUNTER — Encounter (HOSPITAL_COMMUNITY): Payer: Commercial Managed Care - PPO

## 2016-11-26 ENCOUNTER — Encounter (HOSPITAL_COMMUNITY): Payer: Commercial Managed Care - PPO

## 2016-11-28 ENCOUNTER — Encounter (HOSPITAL_COMMUNITY): Payer: Commercial Managed Care - PPO

## 2016-11-30 ENCOUNTER — Encounter (HOSPITAL_COMMUNITY): Payer: Commercial Managed Care - PPO

## 2016-12-03 ENCOUNTER — Encounter (HOSPITAL_COMMUNITY): Payer: Commercial Managed Care - PPO

## 2016-12-17 ENCOUNTER — Ambulatory Visit (INDEPENDENT_AMBULATORY_CARE_PROVIDER_SITE_OTHER): Payer: Commercial Managed Care - PPO | Admitting: Family Medicine

## 2016-12-17 ENCOUNTER — Encounter: Payer: Self-pay | Admitting: Family Medicine

## 2016-12-17 VITALS — BP 124/78 | HR 92 | Temp 98.1°F | Ht 60.0 in | Wt 205.0 lb

## 2016-12-17 DIAGNOSIS — R21 Rash and other nonspecific skin eruption: Secondary | ICD-10-CM

## 2016-12-17 NOTE — Patient Instructions (Addendum)
Thank you for coming in,   You can try Allegra, Claritin, or Zyrtec. He can take Zantac with this as well. He can use the Benadryl at night.  If there is no improvement you can call and I considered adding a different type of prednisone.  Review of any trouble breathing than please seek immediate care.   Please feel free to call with any questions or concerns at any time, at [phone]. --Dr. Jordan Likes

## 2016-12-17 NOTE — Progress Notes (Signed)
Meredith Diaz - 36 y.o. female MRN [id number]  Date of birth: Sep 24, 1980  SUBJECTIVE:  Including CC & ROS.  Chief Complaint  Patient presents with  . Rash    rash on face with swollen right eyelid started saturday. Patient states that she is super itchy on Meredith Diaz face, tongue, and hands. patient states she has not changed soaps or makeup. patient states she thought she had athletes foot and used lotrimin ultra and it went away.    Meredith Diaz is a 36 year old female is presenting with rash. Meredith Diaz symptoms started on Saturday. She reports having some eye swelling and pruritus all over. She denies any trouble swallowing or trouble breathing. Reports Meredith Diaz symptoms are unchanged. Has been taking an antihistamine. She has a history of hyperdermatographia. She has seen a dermatologist and allergist previously when similar symptoms occurred and a workup was negative at that time. She denies any changes in Meredith Diaz medication regimen. She does have a history of heart failure. She has tried hydrocortisone cream with no improvement.     Review of Systems  Constitutional: Negative for fever.  HENT: Negative for trouble swallowing.   Respiratory: Negative for shortness of breath.   Skin: Positive for rash.  Allergic/Immunologic: Negative for environmental allergies.   otherwise negative  HISTORY: Past Medical, Surgical, Social, and Family History Reviewed & Updated per EMR.   Pertinent Historical Findings include:  Past Medical History:  Diagnosis Date  . Anxiety   . Asthma   . Chronic combined systolic and diastolic CHF (congestive heart failure) (HCC) 07/2011   a. Prior Echo: EF was 25-30%; b. No Sig CAD on CATH; c. No significant finding on Cardiac MRI; d. follow-up Echo 2014 --  EF of 50%;  e. 03/2014 Echo: EF 40-45%, gr 1 DD; f. 11/2014 Echo: EF 40-45%, Gr 2 DD.  [name] Congenital heart disease, adult    a. 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  . Depression   . Essential hypertension   . Morbid obesity (HCC)   . Nonischemic cardiomyopathy (HCC)    a. unclear etiology;  b. 01/2012 Cardiac MRI: no infiltrative pathology, EF 52%;  b. 11/2014 Echo: EF 40-45%, gr 2 DD.  [name] Stomach problems   . Thyroid disease     Past Surgical History:  Procedure Laterality Date  . CARDIAC CATHETERIZATION  08/2011   Normal Coronaries - LVEDP 32 mmHg  . Cardiac MRI  April 2013   No suggestion of a congenital abnormality; EF ~ 52% - no regional wall motion abnormalities., no increased myocardial signal intensity. No perfusion abnormality.  . CHOLECYSTECTOMY  2013  . TRANSTHORACIC ECHOCARDIOGRAM  07/2011   Moderate concentric LVH, mildly dilated. EF 25-30% no diastolic dysfunction parameters to suggest elevated LAP  . 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 Normal wall thickness (compared to prior reading of possible apical LVH), grade 2 DD -- with elevated LV filling pressures (however left atrium was read as normal size), normal artery function and size. No no suggestion of pulmonary hypertension    Allergies  Allergen Reactions  . Lisinopril Swelling    Angioedema facial, mostly just lips, no trouble breathing    Family History  Problem Relation Age of Onset  . Depression Mother   .  Hypertension Father   . Colon cancer Maternal Grandmother   . Heart attack Maternal Grandmother   . Heart disease Maternal Grandfather   . Heart attack Maternal Grandfather   . Heart disease Paternal Grandmother   . Breast cancer Paternal Grandmother   . Asthma Paternal Grandmother   . Heart attack Paternal Grandmother   . Heart attack Paternal Grandfather   . Stroke Neg Hx      Social History   Social History  . Marital status: Married    Spouse name: N/A    . Number of children: N/A  . Years of education: N/A   Occupational History  . editor    Social History Main Topics  . Smoking status: Never Smoker  . Smokeless tobacco: Never Used  . Alcohol use No  . Drug use: No  . Sexual activity: Not on file   Other Topics Concern  . Not on file   Social History Narrative  . No narrative on file     PHYSICAL EXAM:  VS: BP 124/78 (BP Location: Right Arm, Patient Position: Sitting, Cuff Size: Large)   Pulse 92   Temp 98.1 F (36.7 C) (Oral)   Ht 5' (1.524 m)   Wt 205 lb (93 kg)   SpO2 96%   BMI 40.04 kg/m  Physical Exam  Constitutional: She is oriented to person, place, and time. She appears well-developed and well-nourished.  HENT:  Head: Normocephalic and atraumatic.  Mouth/Throat: Oropharynx is clear and moist.  Some orbital swelling of Meredith Diaz left eye. No tongue swelling.  Eyes: Conjunctivae and EOM are normal.  Neck: Normal range of motion. Neck supple.  Cardiovascular: Normal rate, regular rhythm and normal heart sounds.   No murmur heard. Pulmonary/Chest: Effort normal and breath sounds normal. She has no rales.  Abdominal: Soft. Bowel sounds are normal.  Musculoskeletal: Normal range of motion. She exhibits no edema.  Lymphadenopathy:    She has no cervical adenopathy.  Neurological: She is alert and oriented to person, place, and time.  Skin: Skin is warm. Rash noted.  Red Papular rash on Meredith Diaz neck and face. No urticaria present  Psychiatric: She has a normal mood and affect. Meredith Diaz behavior is normal.       ASSESSMENT & PLAN:   I spent 25 minutes with this patient, greater than 50% was face-to-face time counseling regarding the below diagnosis.   Rash She has had a previous workup when she had similar symptoms that at all resulted as normal. Unclear if this is a contact dermatitis as opposed to angioedema. She does have angioedema with lisinopril. - Advised to start Benadryl, Zyrtec, and Zantac. - Headed  discussion about taking prednisone. Could start Rayos but she does not want to start a steroid at this time. - Discussed providing Meredith Diaz with an EpiPen. She reports she was next to the hospital and does not want to obtain one at this time. - Advised to follow-up if no improvement. Consider lab work

## 2016-12-17 NOTE — Assessment & Plan Note (Signed)
She has had a previous workup when she had similar symptoms that at all resulted as normal. Unclear if this is a contact dermatitis as opposed to angioedema. She does have angioedema with lisinopril. - Advised to start Benadryl, Zyrtec, and Zantac. - Headed discussion about taking prednisone. Could start Rayos but she does not want to start a steroid at this time. - Discussed providing her with an EpiPen. She reports she was next to the hospital and does not want to obtain one at this time. - Advised to follow-up if no improvement. Consider lab work

## 2016-12-19 ENCOUNTER — Other Ambulatory Visit (HOSPITAL_COMMUNITY): Payer: Self-pay | Admitting: Internal Medicine

## 2016-12-19 ENCOUNTER — Ambulatory Visit (INDEPENDENT_AMBULATORY_CARE_PROVIDER_SITE_OTHER): Payer: Commercial Managed Care - PPO | Admitting: Family

## 2016-12-19 ENCOUNTER — Encounter: Payer: Self-pay | Admitting: Family

## 2016-12-19 VITALS — BP 116/80 | HR 89 | Temp 98.5°F | Resp 16 | Ht 60.0 in | Wt 204.0 lb

## 2016-12-19 DIAGNOSIS — R21 Rash and other nonspecific skin eruption: Secondary | ICD-10-CM

## 2016-12-19 DIAGNOSIS — G4733 Obstructive sleep apnea (adult) (pediatric): Secondary | ICD-10-CM | POA: Diagnosis not present

## 2016-12-19 MED ORDER — DOXYCYCLINE HYCLATE 100 MG PO TABS: 100.0000 mg | ORAL_TABLET | Freq: Two times a day (BID) | ORAL | 0 refills | Status: DC

## 2016-12-19 MED ORDER — PREDNISONE 10 MG (21) PO TBPK: ORAL_TABLET | ORAL | 0 refills | Status: DC

## 2016-12-19 NOTE — Assessment & Plan Note (Addendum)
Maculopapular rash with burning and itching consistent with rosacea. Given distribution unlikely shingles.  Start doxycycline. Written prescription for prednisone provided if symptoms worsen or do not improve. Referral placed for dermatology for further evaluation if worsening occurs.

## 2016-12-19 NOTE — Patient Instructions (Signed)
Thank you for choosing [facility].  SUMMARY AND INSTRUCTIONS:  Please start the doxycycline.   Prednisone as needed.  If your symptoms worsen a referral to dermatology has been placed.   Medication:  Your prescription(s) have been submitted to your pharmacy or been printed and provided for you. Please take as directed and contact our office if you believe you are having problem(s) with the medication(s) or have any questions.  Follow up:  If your symptoms worsen or fail to improve, please contact our office for further instruction, or in case of emergency go directly to the emergency room at the closest medical facility.

## 2016-12-19 NOTE — Progress Notes (Signed)
Subjective:    Patient ID: Meredith Diaz, female    DOB: May 14, 1980, 36 y.o.   MRN: [id number]  Chief Complaint  Patient presents with  . Rash    Rash on face that is causing her pain, hot to the touch, feels like pins and needles are being stuck in her face    HPI:  Meredith Diaz is a 36 y.o. female who  has a past medical history of Anxiety; Asthma; Chronic combined systolic and diastolic CHF (congestive heart failure) (HCC) (07/2011); Congenital heart disease, adult; Depression; Essential hypertension; Morbid obesity (HCC); Nonischemic cardiomyopathy (HCC); Stomach problems; and Thyroid disease. and presents today for a follow up office visit.  Recently evaluated In the office with the associated symptom of a 2 to three-day rash described as red with puritis and swollen eyes that was refractory to over the counter hydrocortisone. Rash was described as red and papular. She declined a steroid taper and was recommended conservative treatment with Benadryl, Zyrtec, and Zantac. Continues to experience the associated symptom of a rash located on her face and resulting in pain and described as hot to the touch and feels like pins and needles are being stuck in her face. Course of symptoms have gradually worsened since initial onset and have been refractory to the modifying factors OTC antihistamines. No changes to facial care products, make up or laundering products.   Allergies  Allergen Reactions  . Lisinopril Swelling    Angioedema facial, mostly just lips, no trouble breathing      Outpatient Medications Prior to Visit  Medication Sig Dispense Refill  . albuterol (VENTOLIN HFA) 108 (90 Base) MCG/ACT inhaler Inhale 1-2 puffs into the lungs every 6 (six) hours as needed for wheezing or shortness of breath. 18 Inhaler 11  . budesonide-formoterol (SYMBICORT) 160-4.5 MCG/ACT inhaler INHALE 2 PUFFS INTO THE LUNGS TWICE DAILY 10.2 Inhaler 11  . calcium carbonate (TUMS - DOSED IN MG ELEMENTAL  CALCIUM) 500 MG chewable tablet Chew 1 tablet by mouth daily as needed for indigestion or heartburn. Reported on 06/15/2015    . Cholecalciferol (VITAMIN D) 2000 units CAPS Take by mouth daily.    . cholestyramine light (PREVALITE) 4 GM/DOSE powder Take 4 g by mouth as needed (DIARRHEA DUE TO BILE ACIDS).     . clobetasol cream (TEMOVATE) 0.05 % Apply 1 application topically as needed (for Sclerosis).     . cyclobenzaprine (FLEXERIL) 5 MG tablet Take 1 tablet (5 mg total) by mouth 3 (three) times daily as needed for muscle spasms. 30 tablet 1  . DULoxetine (CYMBALTA) 60 MG capsule TAKE 1 CAPSULE (60 MG TOTAL) BY MOUTH DAILY. 30 capsule 6  . HYDROcodone-acetaminophen (NORCO/VICODIN) 5-325 MG tablet Take 1 tablet by mouth every 6 (six) hours as needed for moderate pain. 30 tablet 0  . ibuprofen (ADVIL,MOTRIN) 200 MG tablet Take 400 mg by mouth every 6 (six) hours as needed for moderate pain.    [name] levothyroxine (SYNTHROID, LEVOTHROID) 125 MCG tablet Take 1 tablet (125 mcg total) by mouth daily. 90 tablet 3  . LORazepam (ATIVAN) 1 MG tablet Take 0.5-1 tablets (0.5-1 mg total) by mouth 3 (three) times daily as needed for anxiety. 90 tablet 0  . metoprolol succinate (TOPROL-XL) 50 MG 24 hr tablet TAKE 1 TABLET (50 MG TOTAL) BY MOUTH DAILY. TAKE WITH OR IMMEDIATELY FOLLOWING A MEAL. 90 tablet 3  . potassium chloride SA (K-DUR,KLOR-CON) 20 MEQ tablet Take 2 tablets (40 mEq total) by mouth daily. 60 tablet  3  . spironolactone (ALDACTONE) 25 MG tablet TAKE 1 TABLET EVERY DAY 30 tablet 6  . Tiotropium Bromide Monohydrate (SPIRIVA RESPIMAT) 2.5 MCG/ACT AERS Inhale 2 puffs into the lungs daily. 4 g 4  . torsemide (DEMADEX) 20 MG tablet Take 2 tablets (40 mg total) by mouth daily. Alternating with 20 mg daily 60 tablet 3  . traZODone (DESYREL) 50 MG tablet Take 1/2-1 tablet by mouth in evening as needed for sleep 30 tablet 5  . valsartan (DIOVAN) 40 MG tablet TAKE 1 TABLET (40 MG TOTAL) BY MOUTH 2 (TWO) TIMES DAILY.  60 tablet 6   No facility-administered medications prior to visit.       Past Medical History:  Diagnosis Date  . Anxiety   . Asthma   . Chronic combined systolic and diastolic CHF (congestive heart failure) (HCC) 07/2011   a. Prior Echo: EF was 25-30%; b. No Sig CAD on CATH; c. No significant finding on Cardiac MRI; d. follow-up Echo 2014 --  EF of 50%;  e. 03/2014 Echo: EF 40-45%, gr 1 DD; f. 11/2014 Echo: EF 40-45%, Gr 2 DD.  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  . Depression   . Essential hypertension   . Morbid obesity (HCC)   . Nonischemic cardiomyopathy (HCC)    a. unclear etiology;  b. 01/2012 Cardiac MRI: no infiltrative pathology, EF 52%;  b. 11/2014 Echo: EF 40-45%, gr 2 DD.  [name] Stomach problems   . Thyroid disease       Review of Systems  Constitutional: Negative for chills and fever.  Respiratory: Negative for chest tightness and shortness of breath.   Cardiovascular: Negative for chest pain, palpitations and leg swelling.  Skin: Positive for rash.      Objective:    BP 116/80 (BP Location: Left Arm, Patient Position: Sitting, Cuff Size: Large)   Pulse 89   Temp 98.5 F (36.9 C) (Oral)   Resp 16   Ht 5' (1.524 m)   Wt 204 lb (92.5 kg)   SpO2 95%   BMI 39.84 kg/m  Nursing note and vital signs reviewed.  Physical Exam  Constitutional: She is oriented to person, place, and time. She appears well-developed and well-nourished. No distress.  Cardiovascular: Normal rate, regular rhythm, normal heart sounds and intact distal pulses.   Pulmonary/Chest: Effort normal and breath sounds normal.  Neurological: She is alert and oriented to person, place, and time.  Skin: Skin is warm and dry. Rash noted.  Red macularpapular rash located on her cheeks, chin and forehead with defined borders and are non-tender.     Psychiatric: She has a normal mood and affect. Her behavior is normal. Judgment and thought content normal.       Assessment & Plan:   Problem List Items Addressed This Visit      Musculoskeletal and Integument   Rash - Primary    Maculopapular rash with burning and itching consistent with rosacea. Given distribution unlikely shingles.  Start doxycycline. Written prescription for prednisone provided if symptoms worsen or do not improve. Referral placed for dermatology for further evaluation if worsening occurs.      Relevant Orders   Ambulatory referral to Dermatology       I am having Ms. Gotto start on doxycycline and predniSONE. I am also having her maintain her clobetasol cream,  cholestyramine light, ibuprofen, calcium carbonate, traZODone, LORazepam, Vitamin D, Tiotropium Bromide Monohydrate, levothyroxine, albuterol, budesonide-formoterol, potassium chloride SA, metoprolol succinate, torsemide, valsartan, spironolactone, DULoxetine, HYDROcodone-acetaminophen, and cyclobenzaprine.   Meds ordered this encounter  Medications  . doxycycline (VIBRA-TABS) 100 MG tablet    Sig: Take 1 tablet (100 mg total) by mouth 2 (two) times daily.    Dispense:  20 tablet    Refill:  0    Order Specific Question:   Supervising Provider    Answer:   Hillard Danker A [[id number]]  . predniSONE (STERAPRED UNI-PAK 21 TAB) 10 MG (21) TBPK tablet    Sig: Take 6 tablets x 1 day, 5 tablets x 1 day, 4 tablets x 1 day, 3 tablets x 1 day, 2 tablets x 1 day, 1 tablet x 1 day    Dispense:  21 tablet    Refill:  0    Order Specific Question:   Supervising Provider    Answer:   Hillard Danker A [[id number]]     Follow-up: Return if symptoms worsen or fail to improve.  Jeanine Luz, FNP

## 2016-12-31 ENCOUNTER — Other Ambulatory Visit: Payer: Commercial Managed Care - PPO

## 2016-12-31 ENCOUNTER — Ambulatory Visit (HOSPITAL_BASED_OUTPATIENT_CLINIC_OR_DEPARTMENT_OTHER): Payer: Commercial Managed Care - PPO | Admitting: Genetic Counselor

## 2016-12-31 ENCOUNTER — Encounter: Payer: Self-pay | Admitting: Genetic Counselor

## 2016-12-31 DIAGNOSIS — Z800 Family history of malignant neoplasm of digestive organs: Secondary | ICD-10-CM

## 2016-12-31 DIAGNOSIS — Z803 Family history of malignant neoplasm of breast: Secondary | ICD-10-CM | POA: Insufficient documentation

## 2016-12-31 NOTE — Progress Notes (Signed)
REFERRING PROVIDER: Linda Hedges, [address], [address] [place], [place], [place] [address]  PRIMARY PROVIDER:  Hoyt Koch, MD  PRIMARY REASON FOR VISIT:  1. Family history of breast cancer   2. Family history of colon cancer      HISTORY OF PRESENT ILLNESS:   Meredith Diaz, a 36 y.o. female, was seen for a [facility] cancer genetics consultation at the request of Dr. Lynnette Caffey due to a family history of cancer.  Meredith Diaz presents to clinic today to discuss the possibility of a hereditary predisposition to cancer, genetic testing, and to further clarify her future cancer risks, as well as potential cancer risks for family members.  Meredith Diaz is a 36 y.o. female with no personal history of cancer.  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  This was due to having some of her major blood vessels "rearranged", and having failure to thrive.  She was diagnosed with congestive heart failure five years ago.  CANCER HISTORY:   No history exists.     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  First live birth at age N/A.  OCP use for approximately 20 years.  Ovaries intact: yes.  Hysterectomy: no.  Menopausal status: premenopausal.  HRT use: 0 years. Colonoscopy: no; not examined. Mammogram within the last year: no. Number of breast biopsies: 0. Up to date with pelvic exams:  yes. Any excessive radiation exposure in the past:  no  Past Medical History:  Diagnosis Date  . Anxiety   . Asthma   . Chronic combined systolic and diastolic CHF (congestive heart failure) ([place]) 07/2011   a. Prior Echo: EF was 25-30%; b. No Sig CAD on CATH; c. No significant finding on Cardiac MRI; d. follow-up Echo 2014 --  EF of 50%;  e. 03/2014 Echo: EF 40-45%, gr 1 DD; f. 11/2014 Echo: EF 40-45%, Gr 2 DD.  [name] Congenital heart disease, adult     a. 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  . Depression   . Essential hypertension   . Family history of breast cancer   . Family history of colon cancer   . Morbid obesity ([place])   . Nonischemic cardiomyopathy ([place])    a. unclear etiology;  b. 01/2012 Cardiac MRI: no infiltrative pathology, EF 52%;  b. 11/2014 Echo: EF 40-45%, gr 2 DD.  [name] Stomach problems   . Thyroid disease     Past Surgical History:  Procedure Laterality Date  . CARDIAC CATHETERIZATION  08/2011   Normal Coronaries - LVEDP 32 mmHg  . Cardiac MRI  April 2013   No suggestion of a congenital abnormality; EF ~ 52% - no regional wall motion abnormalities., no increased myocardial signal intensity. No perfusion abnormality.  . CHOLECYSTECTOMY  2013  . TRANSTHORACIC ECHOCARDIOGRAM  07/2011   Moderate concentric LVH, mildly dilated. EF 58-52% no diastolic dysfunction parameters to suggest elevated LAP  . 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 Normal wall thickness (compared to prior reading of possible apical LVH), grade 2 DD -- with elevated LV filling pressures (however left atrium  was read as normal size), normal artery function and size. No no suggestion of pulmonary hypertension    Social History   Social History  . Marital status: Married    Spouse name: N/A  . Number of children: N/A  . Years of education: N/A   Occupational History  . editor    Social History Main Topics  . Smoking status: Never Smoker  . Smokeless tobacco: Never Used  . Alcohol use No  . Drug use: No  . Sexual activity: Not Asked   Other Topics Concern  . None   Social History Narrative  . None     FAMILY HISTORY:  We obtained a detailed, 4-generation family history.   Significant diagnoses are listed below: Family History  Problem Relation Age of Onset  . Depression Mother   . Hypertension Father   . Colon cancer Maternal Grandmother 24  . Heart attack Maternal Grandmother   . Heart disease Maternal Grandfather   . Heart attack Maternal Grandfather   . Heart disease Paternal Grandmother   . Breast cancer Paternal Grandmother 71       possible bilateral cancer  . Asthma Paternal Grandmother   . Heart attack Paternal Grandmother   . Heart attack Paternal Grandfather   . Breast cancer Maternal Aunt 11  . Breast cancer Paternal Aunt 50  . Stroke Neg Hx     The patient does not have children.  She has a full brother and a paternal half sister who are both healthy and cancer free.  Both parents are both living.  The patient's mother is 61 and healthy.  She has two brothers and a sister.  Her sister was diagnosed with breast cancer at age 52.  The maternal grandmother is deceased.  She was diagnosed with colon cancer at 75 and had mets to her lungs.  She died at 31 from a heart attack.  Her grandfather is alive at 45.  The patient's father is alive at 24 and is healthy.  He has a sister and paternal half brother.  The sister was diagnosed with breast cancer at age 42.  Both paternal grandparents are deceased.  The grandmother was diagnosed with breast cancer at 20.  She had a double mastectomy, and the patient thinks she may have had bilateral breast cancer.  The grandfather died of a heart attack.    Meredith Diaz is unaware of previous family history of genetic testing for hereditary cancer risks. Patient's maternal ancestors are of [place] descent, and paternal ancestors are of [place] descent. There is no reported Ashkenazi Jewish ancestry. There is no known consanguinity.  GENETIC COUNSELING ASSESSMENT: Meredith Diaz is a 36 y.o. female with a family history of breast and colon cancer which is somewhat suggestive of a hereditary cancer syndrome and  predisposition to cancer. We, therefore, discussed and recommended the following at today's visit.   DISCUSSION: We discussed that about 5-10% of breast cancer is hereditary with most cases due to BRCA mutations.  Other genes associated with hereditary risks for breast cancer include ATM, CHEK2 and PALB2.  We also discussed her concern for other conditions that may be related to her issues when she was an infant.  She was born at [facility] in [place], and was worked up for Goldenhar syndrome there.  She developed precocious puberty, and has not been worked up through [facility] since.  We discussed possibly going to [facility] and being seen by adult genetics.  She can call [phone]  to make an appointment.  We reviewed the characteristics, features and inheritance patterns of hereditary cancer syndromes. We also discussed genetic testing, including the appropriate family members to test, the process of testing, insurance coverage and turn-around-time for results. We discussed the implications of a negative, positive and/or variant of uncertain significant result. We recommended Meredith Diaz pursue genetic testing for the common hereditary cancer gene panel. The Hereditary Gene Panel offered by [facility] includes sequencing and/or deletion duplication testing of the following 46 genes: APC, ATM, AXIN2, BARD1, BMPR1A, BRCA1, BRCA2, BRIP1, CDH1, CDKN2A (p14ARF), CDKN2A (p16INK4a), CHEK2, CTNNA1, DICER1, EPCAM (Deletion/duplication testing only), GREM1 (promoter region deletion/duplication testing only), KIT, MEN1, MLH1, MSH2, MSH3, MSH6, MUTYH, NBN, NF1, NHTL1, PALB2, PDGFRA, PMS2, POLD1, POLE, PTEN, RAD50, RAD51C, RAD51D, SDHB, SDHC, SDHD, SMAD4, SMARCA4. STK11, TP53, TSC1, TSC2, and VHL.  The following genes were evaluated for sequence changes only: SDHA and HOXB13 c.251G>A variant only.  Based on Meredith Diaz's family history of cancer, she meets medical criteria for genetic testing. Despite that she meets  criteria, she may still have an out of pocket cost. We discussed that if her out of pocket cost for testing is over $100, the laboratory will call and confirm whether she wants to proceed with testing.  If the out of pocket cost of testing is less than $100 she will be billed by the genetic testing laboratory.   In order to estimate her chance of having a BRCA mutation, we used statistical models ([employer] and [employer]) and laboratory data that take into account her personal medical history, family history and ancestry.  Because each model is different, there can be a lot of variability in the risks they give.  Therefore, these numbers must be considered a rough range and not a precise risk of having a BRCA mutation.  These models estimate that she has approximately a 4% chance of having a mutation.   Based on the patient's personal and family history, statistical models (Tyrer Cusik)  and literature data were used to estimate her risk of developing breast cancer. These estimate her lifetime risk of developing breast cancer to be approximately 21.3% to 26.9% (depending on whether we use the age of menarche of 41 or 14). This estimation does not take into account any genetic testing results.  The patient's lifetime breast cancer risk is a preliminary estimate based on available information using one of several models endorsed by the [place] (ACS). The ACS recommends consideration of breast MRI screening as an adjunct to mammography for patients at high risk (defined as 20% or greater lifetime risk). A more detailed breast cancer risk assessment can be considered, if clinically indicated.   Meredith Diaz has been determined to be at high risk for breast cancer.  Therefore, we recommend that annual screening with mammography and breast MRI begin at age 40, or 10 years prior to the age of breast cancer diagnosis in a relative (whichever is earlier).  We discussed that Meredith Diaz should discuss her  individual situation with her referring physician and determine a breast cancer screening plan with which they are both comfortable.       PLAN: After considering the risks, benefits, and limitations, Meredith Diaz  provided informed consent to pursue genetic testing and the blood sample was sent to [facility] for analysis of the Common Hereditary cancer panel. Results should be available within approximately 2-3 weeks' time, at which point they will be disclosed by telephone to Meredith Diaz, as will  any additional recommendations warranted by these results. Meredith Diaz will receive a summary of her genetic counseling visit and a copy of her results once available. This information will also be available in Epic. We encouraged Meredith Diaz to remain in contact with cancer genetics annually so that we can continuously update the family history and inform her of any changes in cancer genetics and testing that may be of benefit for her family. Meredith Diaz questions were answered to her satisfaction today. Our contact information was provided should additional questions or concerns arise.  We also recommend that Meredith Diaz contact [facility] to determine if her birth defects, and recent congestive heart failure could be due to a hereditary condition.  [facility] can be reached at [phone].  Lastly, we encouraged Ms. Dengel to remain in contact with cancer genetics annually so that we can continuously update the family history and inform her of any changes in cancer genetics and testing that may be of benefit for this family.   Ms.  Diaz questions were answered to her satisfaction today. Our contact information was provided should additional questions or concerns arise. Thank you for the referral and allowing [place] to share in the care of your patient.   Meredith Diaz P. Florene Glen, [place], [facility] Certified Genetic Counselor [email or web address] phone: [phone]  The patient was seen for a  total of 58 minutes in face-to-face genetic counseling.  This patient was discussed with Drs. Magrinat, Lindi Adie and/or Burr Medico who agrees with the above.    _______________________________________________________________________ For Office Staff:  Number of people involved in session: 1 Was an Intern/ student involved with case: yes: Meredith Paganini

## 2017-01-14 ENCOUNTER — Other Ambulatory Visit: Payer: Self-pay | Admitting: Internal Medicine

## 2017-01-15 ENCOUNTER — Telehealth: Payer: Self-pay | Admitting: Genetic Counselor

## 2017-01-15 ENCOUNTER — Ambulatory Visit: Payer: Self-pay | Admitting: Genetic Counselor

## 2017-01-15 DIAGNOSIS — Z800 Family history of malignant neoplasm of digestive organs: Secondary | ICD-10-CM

## 2017-01-15 DIAGNOSIS — Z1379 Encounter for other screening for genetic and chromosomal anomalies: Secondary | ICD-10-CM

## 2017-01-15 DIAGNOSIS — Z803 Family history of malignant neoplasm of breast: Secondary | ICD-10-CM

## 2017-01-15 NOTE — Telephone Encounter (Signed)
Revealed negative genetic testing.  Discussed that we do not know why there is cancer in the family. It could be due to a different gene that we are not testing, or maybe our current technology may not be able to pick something up.  It will be important for her to keep in contact with genetics to keep up with whether additional testing may be needed. Discussed that she is still at elevated risk for breast cancer based on family history and should discuss with Dr. Langston Masker the best strategy for following this.  Provided her the phone number for [facility] adult genetics to call and schedule an appointment if she wanted to get more information about Goldenhar syndrome.  Discussed CTNNA1 VUS.  This would not change medical management based on it being a VUS.  We will recontact in the future when it is reclassified.

## 2017-01-15 NOTE — Progress Notes (Signed)
HPI: Meredith Diaz was previously seen in the [facility] due to a family history of cancer and concerns regarding a hereditary predisposition to cancer. Please refer to our prior cancer genetics clinic note for more information regarding Meredith Diaz's medical, social and family histories, and our assessment and recommendations, at the time. Meredith Diaz recent genetic test results were disclosed to her, as were recommendations warranted by these results. These results and recommendations are discussed in more detail below.  CANCER HISTORY:   No history exists.    FAMILY HISTORY:  We obtained a detailed, 4-generation family history.  Significant diagnoses are listed below: Family History  Problem Relation Age of Onset  . Depression Mother   . Hypertension Father   . Colon cancer Maternal Grandmother 59  . Heart attack Maternal Grandmother   . Heart disease Maternal Grandfather   . Heart attack Maternal Grandfather   . Heart disease Paternal Grandmother   . Breast cancer Paternal Grandmother 50       possible bilateral cancer  . Asthma Paternal Grandmother   . Heart attack Paternal Grandmother   . Heart attack Paternal Grandfather   . Breast cancer Maternal Aunt 31  . Breast cancer Paternal Aunt 16  . Stroke Neg Hx     The patient does not have children.  She has a full brother and a paternal half sister who are both healthy and cancer free.  Both parents are both living.  The patient's mother is 30 and healthy.  She has two brothers and a sister.  Her sister was diagnosed with breast cancer at age 45.  The maternal grandmother is deceased.  She was diagnosed with colon cancer at 15 and had mets to her lungs.  She died at 60 from a heart attack.  Her grandfather is alive at 35.  The patient's father is alive at 74 and is healthy.  He has a sister and paternal half brother.  The sister was diagnosed with breast cancer at age 43.  Both paternal grandparents are  deceased.  The grandmother was diagnosed with breast cancer at 68.  She had a double mastectomy, and the patient thinks she may have had bilateral breast cancer.  The grandfather died of a heart attack.    Meredith Diaz is unaware of previous family history of genetic testing for hereditary cancer risks. Patient's maternal ancestors are of [place] descent, and paternal ancestors are of [place] descent. There is no reported Ashkenazi Jewish ancestry. There is no known consanguinity.  GENETIC TEST RESULTS: Genetic testing reported out on January 08, 2017 through the common hereditary cancer panel found no deleterious mutations.  The Hereditary Gene Panel offered by [facility] includes sequencing and/or deletion duplication testing of the following 46 genes: APC, ATM, AXIN2, BARD1, BMPR1A, BRCA1, BRCA2, BRIP1, CDH1, CDKN2A (p14ARF), CDKN2A (p16INK4a), CHEK2, CTNNA1, DICER1, EPCAM (Deletion/duplication testing only), GREM1 (promoter region deletion/duplication testing only), KIT, MEN1, MLH1, MSH2, MSH3, MSH6, MUTYH, NBN, NF1, NHTL1, PALB2, PDGFRA, PMS2, POLD1, POLE, PTEN, RAD50, RAD51C, RAD51D, SDHB, SDHC, SDHD, SMAD4, SMARCA4. STK11, TP53, TSC1, TSC2, and VHL.  The following genes were evaluated for sequence changes only: SDHA and HOXB13 c.251G>A variant only.  The test report has been scanned into EPIC and is located under the Molecular Pathology section of the Results Review tab.   We discussed with Meredith Diaz that since the current genetic testing is not perfect, it is possible there may be a gene mutation in one of these genes that current  testing cannot detect, but that chance is small. We also discussed, that it is possible that another gene that has not yet been discovered, or that we have not yet tested, is responsible for the cancer diagnoses in the family, and it is, therefore, important to remain in touch with cancer genetics in the future so that we can continue to offer Meredith Diaz the most up to date  genetic testing.   Genetic testing did detect a Variant of Unknown Significance in the CTNNA1 gene called c.892G>A.  At this time, it is unknown if this variant is associated with increased cancer risk or if this is a normal finding, but most variants such as this get reclassified to being inconsequential. It should not be used to make medical management decisions. With time, we suspect the lab will determine the significance of this variant, if any. If we do learn more about it, we will try to contact Meredith Diaz to discuss it further. However, it is important to stay in touch with [place] periodically and keep the address and phone number up to date.     CANCER SCREENING RECOMMENDATIONS:  This normal result is reassuring and indicates that Meredith Diaz does not likely have an increased risk of cancer due to a mutation in one of these genes.  We, therefore, recommended  Meredith Diaz continue to follow the cancer screening guidelines provided by her primary healthcare providers.   Based on the Meredith Diaz family history of cancer, as well as her genetic test results, statistical models (Tyrer Cusik)  and literature data were used to estimate her risk of developing breast cancer. These estimate her lifetime risk of developing breast cancer to be approximately 23.5%.  The patient's lifetime breast cancer risk is a preliminary estimate based on available information using one of several models endorsed by the [employer] (ACS). The ACS recommends consideration of breast MRI screening as an adjunct to mammography for patients at high risk (defined as 20% or greater lifetime risk). A more detailed breast cancer risk assessment can be considered, if clinically indicated.   Meredith Diaz has been determined to be at high risk for breast cancer.  Therefore, we recommend that annual screening with mammography and breast MRI.  We discussed that Meredith Diaz should discuss her individual situation with her referring  physician and determine a breast cancer screening plan with which they are both comfortable.    RECOMMENDATIONS FOR FAMILY MEMBERS: Women in this family might be at some increased risk of developing cancer, over the general population risk, simply due to the family history of cancer. We recommended women in this family have a yearly mammogram beginning at age 68, or 69 years younger than the earliest onset of cancer, an annual clinical breast exam, and perform monthly breast self-exams. Women in this family should also have a gynecological exam as recommended by their primary provider. All family members should have a colonoscopy by age 80.  Based on Meredith Diaz's family history, we recommended her paternal aunt, who was diagnosed with breast cancer at age 33, have genetic counseling and testing. Ms. Deckard will let us know if we can be of any assistance in coordinating genetic counseling and/or testing for this family member.   FOLLOW-UP: Lastly, we discussed with Ms. Toren that cancer genetics is a rapidly advancing field and it is possible that new genetic tests will be appropriate for her and/or her family members in the future. We encouraged her to remain in contact with  cancer genetics on an annual basis so we can update her personal and family histories and let her know of advances in cancer genetics that may benefit this family.   Our contact number was provided. Ms. Boulier questions were answered to her satisfaction, and she knows she is welcome to call us at anytime with additional questions or concerns.   Roma Kayser, MS, [facility] Certified Genetic Counselor [email or web address]

## 2017-01-19 DIAGNOSIS — G4733 Obstructive sleep apnea (adult) (pediatric): Secondary | ICD-10-CM | POA: Diagnosis not present

## 2017-01-24 DIAGNOSIS — L239 Allergic contact dermatitis, unspecified cause: Secondary | ICD-10-CM | POA: Diagnosis not present

## 2017-02-18 ENCOUNTER — Encounter: Payer: Self-pay | Admitting: Pulmonary Disease

## 2017-02-18 ENCOUNTER — Ambulatory Visit (INDEPENDENT_AMBULATORY_CARE_PROVIDER_SITE_OTHER): Payer: Commercial Managed Care - PPO | Admitting: Pulmonary Disease

## 2017-02-18 VITALS — BP 116/78 | HR 86 | Ht 60.0 in | Wt 204.0 lb

## 2017-02-18 DIAGNOSIS — J4530 Mild persistent asthma, uncomplicated: Secondary | ICD-10-CM

## 2017-02-18 DIAGNOSIS — I429 Cardiomyopathy, unspecified: Secondary | ICD-10-CM | POA: Diagnosis not present

## 2017-02-18 DIAGNOSIS — M4125 Other idiopathic scoliosis, thoracolumbar region: Secondary | ICD-10-CM

## 2017-02-18 DIAGNOSIS — Z9989 Dependence on other enabling machines and devices: Secondary | ICD-10-CM | POA: Diagnosis not present

## 2017-02-18 DIAGNOSIS — G4734 Idiopathic sleep related nonobstructive alveolar hypoventilation: Secondary | ICD-10-CM | POA: Diagnosis not present

## 2017-02-18 DIAGNOSIS — G4733 Obstructive sleep apnea (adult) (pediatric): Secondary | ICD-10-CM | POA: Diagnosis not present

## 2017-02-18 DIAGNOSIS — Z6838 Body mass index (BMI) 38.0-38.9, adult: Secondary | ICD-10-CM

## 2017-02-18 DIAGNOSIS — I10 Essential (primary) hypertension: Secondary | ICD-10-CM | POA: Diagnosis not present

## 2017-02-18 DIAGNOSIS — F4323 Adjustment disorder with mixed anxiety and depressed mood: Secondary | ICD-10-CM | POA: Diagnosis not present

## 2017-02-18 DIAGNOSIS — I5042 Chronic combined systolic (congestive) and diastolic (congestive) heart failure: Secondary | ICD-10-CM

## 2017-02-18 DIAGNOSIS — J984 Other disorders of lung: Secondary | ICD-10-CM

## 2017-02-18 MED ORDER — BUDESONIDE-FORMOTEROL FUMARATE 160-4.5 MCG/ACT IN AERO: 2.0000 | INHALATION_SPRAY | Freq: Two times a day (BID) | RESPIRATORY_TRACT | 0 refills | Status: DC

## 2017-02-18 NOTE — Progress Notes (Signed)
Subjective:     Patient ID: Meredith Diaz, female   DOB: Jul 28, 1980, 36 y.o.   MRN: [id number]  HPI 36 y/o WF, referred by [facility] for a pulmonary evaluation due to a hx of Asthma and increased DOE;  She has a complex hx of congenital heart dis and a cardiomyopathy w/ combined sys&diast CHF; hx anorexia as a teen=> morbid obesity as a young adult; & scoliosis/ chest wall deformity w/ pulmonary restrictive disease...   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, English as a second language teacher for [facility], pt of M[facility] Diaz & referred for pulmonary evaluation w/ hx asthma- she notes increased DOE & no relief from her inhalers> Meredith Diaz is a never smoker & was diagnosed w/ asthma first in 58 at age 36 or so when [facility] for pneumonia (Cariology said she was hosp for CHF w/ EF=25-30% and was in [facility] for 32mo & she's had trouble ever since then, disch on NEBS;  Treated over the yrs w/ MDIs, Symbicort/Advair (the latter didn't help), last had Pred before 2013 hosp;  She was allergy tested in middle school & essentially neg x mild grass reaction by her memory;  She gets "sinus infections" a couple of times each yr (she thinks this is her main trigger) & uses Claritin/ Flonase prn;  Symptoms included SOB/ DOE w/ min activ, feels like she can't get a deep breath/ can't get enough air "in"/ not satisfied breathing (states this sensation is present all the time/constant, & she notes "gasping"); states she notes some wheezing when supine at night & some voice issues (?VCD, tight burning in throat) but denies reflux/ dysphagia/ nocturnal cough or choking (note- she has IBS & s/pGB 2013)... She also c/o sleep issues> can't sleep well while lying down, husb c/o her "wheezing"/ some snoring (but does not indicate that she stops breathing)/ restless sleeper/ leg jerks; she does have some daytime sleepiness issues w/ incr fatigue at work and she'll take naps on weekends; ESS=12... Current Pulm Meds> Symbicort160-  2puffs daily, AlbutHFA for prn use...       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 Cumulative data from cardiac evals as follows, she was told her cardiac issues are ?congenital, ?virus damaged her heart>> she has never had cardiac rehab etc...   [facility], biapical pleural thickening, otherw clear & NAD...  [facility] showed EF=52% w/ mild global HK, min AI, RV- wnl, Ao- wnl, mild pectus excavqatum w/ deviation of cardiac apex...  Cath was done in 2013 but I cannot find that report (said no signif CAD)...  EKG 11/15 showed STachy, rate 103, ?LAE, suggestive LVH, NSSTTWA...   2DEcho 11/15 by [facility] showed mild LVH, ?poss apical variant hypertrophic cardiomyopathy, EF=40-45% w/ diffuse HK, Gr1DD, MV- mildly thickened leaflets and trivMR, norm LA size...  Current Meds> Metoprolol-ER'25mg'$ /d, Apresoline25-1.5tabsTid, Lasix40-1.5tabs/d, Zaroxyln2.'5mg'$ /d She has Hx ANOREXIA=>obesity> currently 203# (this is her max wt she says), 5' tall, BMI=39.6; she states that she's lost weight in the past & that she felt much better when her weight was down... Notes hx anorexia/ depression when in high school w/ lowest wt=80 lbs; psyche rx & counseling resulted in wt gain & she was ~150 lbs in college; gained to  180# after college & lost wt to 165 prior to her marriage at age 30; now she is 36 y/o & has gained to max 203#... She is allergic to ACE inhibitors w/ prev angioedema...  EXAM reveals Afeb, VSS, O2sat=93% on RA at rest;  HEENT- neg, mallampati 2, voice sl hoarse;  Chest- decr BS bilat, clear, decr diaph excursions by percussion, freq sign breaths;  Heart- RRR w/p m/r/g;  Ext- no c/c/e...  Recent LABS> Chems- wnl;  CBC- ok w/ Hg=12.4, WBC=14K;  TSH=2.62;  B12 level= 180-250 (on  oral B12- 1000mcg/d)...  CXR 08/16/14 showed norm heart size, clear lungs, NAD...  Spirometry 08/16/14> poor effort (breathing "high" in her lung volumes)> FVC=0.84 (27%), FEV1=0.66 (24%), %1sec=78, mid-flows reduced at 18% predicted; Test indicated severe restriction w/ small airways dis...   Ambulatory oxygen saturations> O2sat=95% on RA at rest w/ pulse=89/min;  She walked 2 laps and stopped due to SOB- O2sat=83% w/ pulse 121/min...  FENO= 10ppb (results <25ppd implies absence of eosinophilic airway inflammation).  Epworth Sleepiness Score= 12 (indicating that she may be excessively sleepy depending on the situation... IMP/PLAN>>  M[facility] Diaz has been diagnosed w/ asthma over the last 20+yrs but her current symptoms and spirometry looks more restricted- prob due to "chest wall factors" as we discussed;  She has additional issues from obesity- r/o OSA, and from her Cardiac disease- hx congenital HD w/ surg, HBP, cardiomyopathy w/ combined sys/diast CHF- followed & treated by [facility], [facility]... We discussed the nature of her dyspnea and the need for additional testing and a trial of KLONOPIN 0.5mg Bid... We will sched FullPFTs and a Sleep Study and have her return after that...  ~  August 26, 2014:  2wk ROV & [facility] returns having completed her Full PFTs and Sleep study> she has in addition been on the Klonopin 0.5mg Bid but notes no improvement in her breathing on this dose (tol well just no better)...  Full PFTs 08/25/14 showed FVC=0.87 (26%), FEV1=0.77(27%), %1sec=88, mid-flows reduced at 28% predicted; after bronchodil- the FEV1 improved 6%; Lung volumes reduced w/ TLC=1.88 (42%), RV=0.85 (66%), RV/TLC=45 (some air trapping); DLCO=25% predicted, but corrects to normal (102%) when alveolar ventilation is taken into account...  Sleep Polysomnogram 08/18/14> Mod OSA w/ AHI=20/hr and REM AHI=54, mod snoring, desaturation to 64%...  IMP/PLANS>>  PFTs severely restricted (?effort/ accuracy) & seems to be  mostly related to obesity/ chest wall and not to underlying lung parenchymal factors (CXR clear, exam clear, we will consider hi-res CTChest later if needed); In this regard we will increase the KLONOPIN to 1mgBid before giving up on this med; she understands how important wt reduction is to her overall improvement- may want to consider eating disorder counseling again... Her Sleep study reveals mod OSA & nocturnal hypoxemia- I discussed w/ DrClance & we decided on CPAP set up> RESMED S10 machine- air/auto w/ heated humidity & climate controlled tubing, set on AUTO 5-15, enroll in AirView, mask interface of choice per DME company & pt preference... We will plan ROV recheck in 6 weeks to review compliance & efficacy, then consider doing an ONO...   ~  October 14, 2014:  6wk ROV & [facility] has lost 2# in the interval but not able to exercise- walks some, gets winded, stairs are difficult but able to negotiate one flight; we reviewed how critically important wt reduction is for her;  She still appears sl hoarse to me, scratchy voice which she believes is due to "allergy issues" => rec ENT evaluation for   completeness;  She increased the Klonopin to 1mgBid & this is helping some she says- not as SOB at rest, able to get a deeper breath she thinks, etc;  Using CPAP satis w/ good compliance & download data showing AHI<1/hr on CPAP; she has nasal pillows but wants full face mask & we will let [employer] know... We reviewed the following medical problems>>     DYSPNEA- multifactorial> ?asthma is a small component w/ restrictive dis due to obesity & scoliosis as a major factor, and CHF, deconditioning, anxiety as additional factors...    OSA>  Mod OSA on sleep study 4/16 w/ AHI=20/hr and REM AHI=54, mod snoring, desaturation to 64%; started on CPAP via [employer], auto 5-15 w/ good compliance => AHI <1/hr on the CPAP autoset 5-15, she wants to change nasal pillows to full face mask, we will check w/ [employer], then do ONO...    Hx  asthma> on Symbicort160- 2sp daily, VentolinHFA rescue prn- using 2-3x/wk; PFT 4/16 w/ severe restriction, + small airways dis & min response to bronchodil; asked to continue inhalers for now...    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    Hx HBP> on MetopER25, Apres25- 1.5 tabsTid, Lasix40- 1.5tabsQam, Zaroxyln2.5 prn swelling- taking it almost every day; K=4.2 5/16 at [facility]; Note> allergic to ACE w/ angioedema of lips in past; BP= 130/88 today w/o CP etc...    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    Eating disorder/ Morbid Obesity> unusual hx of eating disorder w/ ANOREXIA as teen=> OBESITY w/ wt=201#, 5' Tall, BMI=39-40; she notes hx anorexia/ depression when in high school w/ lowest wt=80 lbs; psyche rx & counseling resulted in wt gain & she was ~150 lbs in college; gained to 180# after college & lost wt to 165 prior to her marriage at age 30 (she felt better when wt was down); now she is 36 y/o & has gained to max 203#...    Hypothyroidism> on Synthroid50; Labs 12/15 showed TSH=2.62, FreeT4=0.87    Hx IBS & s/p GB in 2013> aware...    Scoliosis & back pain> she was prev evaluated at [facility] by a back specialist, she does not recall the details, no records available, she notes legs get tired, she is not falling, notes that her hands shake on occas... This is contrib to her pulmonary restriction and dyspnea => need back films then consider  referral the [facility] for their review & recommendations...    VitB12 defic> Labs showed B12 blood level 179-248 and she is rec to take B12 1000mcg orally Qd w/ recheck levels...     Anxiety/ depression> on Klonopin 1mg Bid at present... EXAM reveals Afeb, VSS, O2sat=94% on RA at rest; HEENT- neg, mallampati 2, voice sl hoarse;  Chest- decr BS bilat, clear, decr diaph excursions by percussion, occas sign breaths;  Heart- RRR w/p m/r/g;  Ext- no c/c/e.  LABS 5/16 at [facility] showed Chems- wnl w/ Cr=1.04, BS=83, A1c=5.8;  FLP- at   goals on diet alone...  [employer] AirView download> 5/11 - 10/13/14 showed good compliance w/ AHI <1/hr on the CPAP autoset 5-15... PLAN>>  She has a very complex medical situation as outlined above, and we are hampered by lack of old records (I have asked her to help in getting this data);  For now she will continue the Symbicort, Ventolin, Klonopin, & CPAP; we will have [employer] look into full face mask per her preference; then proceed w/ ONO;  She knows the importance of wt reduction & may want to consider counseling from Dr. Ed Lurie for eating disorder;  Continue cardiac meds and f/u w/ [facility];  She is only 36 y/o w/ signif pulm restriction & heart disease;  We will XRay her spine in anticipation of getting old records from [facility] & poss referral if this is playing a signif roll as well...  ~  December 14, 2014:  2mo ROV & [facility] returns for follow up> stable on her current regimen; she has multifactorial dyspnea>     OSA>  Mod OSA on sleep study 4/16 w/ AHI=20/hr and REM AHI=54, mod snoring, desaturation to 64%; started on CPAP via [employer], auto 5-15 w/ good compliance => AHI <1/hr on the CPAP autoset 5-15, she changed nasal pillows to full face mask, we will check download on FM, then do ONO...    Hx asthma> on Symbicort160- 2sp daily, VentolinHFA rescue prn- using 2-3x/wk; PFT 4/16 w/ severe restriction, + small airways dis & min response to bronchodil; asked to continue  inhalers for now...    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    Hx HBP> on MetopER25, Apres25- 1.5 tabsTid, Lasix40- 1.5tabsQam, Zaroxyln2.5 prn swelling- taking it almost every day; K=4.2 5/16 at [facility]; Note> allergic to ACE w/ angioedema of lips in past; BP= 130/88 today w/o CP etc...    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    Eating disorder/ Morbid Obesity> unusual hx of eating disorder w/ ANOREXIA as teen=> OBESITY w/ wt=204#, 5' Tall, BMI=39-40; she notes hx anorexia/ depression when in high school w/ lowest wt=80 lbs; psyche rx & counseling resulted in wt gain & she was ~150 lbs in college; gained to 180# after college & lost wt to 165 prior to her marriage at age 30 (she felt better when wt was down); now she is 36 y/o & has gained to max 204#... EXAM reveals Afeb, VSS, O2sat=95% on RA at rest; HEENT- neg, mallampati 2, voice sl hoarse;  Chest- decr BS bilat, clear, decr diaph excursions by percussion, occas sign breaths;  Heart- RRR w/p m/r/g;  Ext- no c/c/e. PLAN>>  M[facility] Diaz will soon start cardiac rehab w/ it's exercise & nutritional counseling- it is imperitive that she get the weight down!!!  We will check CPAP download on full face mask & then ONO on the CPAP set up to be sure that she is not desaturating...  ADDENDUM>>  CPAP download 8/10 - 01/13/15 on autoset 5-15 and now   w/ FULL FaceMask showed 90% compliance, ave 5-6H per night,  AHI=0.6.Marland KitchenMarland Kitchen Continue same. ADDENDUM>>  ONO done 01/13/15 on CPAP on RA showed O2sat=88% for 25.59mn of the 7H study; lowest O2sat=63%; oxygen desat index=51 desat events per hr; she qualifies for O2 bleed-in at 2L/min for her CPAP unit...  ~  March 17, 2015:  338moOV & ElFaryns in Cardiac Rehab per [facility], sl improved & planning to continue in maintenance program;  She notes that her breathing is "ok" except w/ weather changes, she's been using the O2 bled into her CPAP Qhs; states she awakes at 3-4AM & can't get back to sleep- she remains on Klonopin 76m24mid, she tried TylenoloPM w/o help, tried her Lorazepam w/o help, we discussed trial of DESYREL25-50Qhs...     [facility]> known nonischemic cardiomyop w/ EF=40-45%, stress test done by [facility] 12/06/14 was neg for ischemia but showed poor exercise tol & hypertensive response; she had CTAngio in ER- neg for PE, CP resolved w/ MS; serial EKG & enz were neg- they added Aldactone and disch for [facility] f/u...  She saw Meredith Diaz 01/27/15 => meds adjusted & his note is reviewed, they plan rov in Dec...     She continues to f/u w/ [facility] who started Cymbalta for depression- slight improved... EXAM reveals Afeb, VSS, O2sat=97% on RA at rest; HEENT- neg, mallampati 2, voice sl hoarse;  Chest- decr BS bilat, decr chest wall movement and decr diaph excursions by percussion, occas sign breaths, clear;  Heart- RRR w/p m/r/g;  Ext- no c/c/e.  CXR 01/05/15 showed ?left pleural thickening, r/o effusion, no focal airsp dis, scoliosis...  CT Angio Chest 01/05/15 showed mild cardiomeg, no evid of PE, ectasia of Ao measuring 3.7cm, no adenopathy, no consolidation or effusion, min Atx at left base, fusion of several vertebrae, fusion of several post ribs bilat...  LABS 12/2014> K was low at 3.0=> supplemented to norm;  BS in the 120-130 range;  Cr in the 1.1-1.2 range;  Troponins=neg;  BNP=19;  CBC- wnl... IMP/PLAN>>  She has severe pulmonary  restriction- obesity, scoliosis, ankylosis;  Weight is down to 187# on diet & [facility] re[facility] exercises;  rec to continue current meds and we will try Desyrel 25-50 Qhs for sleep...  ~  June 20, 2015:  54mo76mo & last visit we continued same treatment regimen (encouraged diet, exercise, wt reduction) & added Desyrel25=>50Qhs to help her rest thru the night; ElliJadalynnorts that this was NOT working, still wakes at 3-4AYRC Worldwidels exhausted all the time, plus it caused her to ?rage & ?scream she says;  She continues in [facility] maintenance program & she endorses better stamina when walking but still feels SOB "can't get enough air" despite the Klonopin 76mg 35m; while being monitored during exercise she says her O2sats drop & I've asked her to check this formally & keep record to see if her ambulatory O2 needs modification (today in office her lowest O2sat=90% w/ exercise);  She tells me that [facility] (seen 2/17 c/o hair falling out, aching all over, exhausted, panic attacks) [facility] due to CBC w/ WBC=13-17K w/ prev norm diff; I suggested to the pt that her PCP may consider referral to Rheum for poss FM/CFS eval...     Hx asthma> on Symbicort160- 2sp daily, VentolinHFA rescue prn- using 1-2x/wk; PFT 4/16 w/ severe restriction, + small airways dis & min response to bronchodil; asked  to continue inhalers for now...    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    Hx HBP> on MetopER50, Diovan40Bid, Demadex20, Aldactone25, K20Bid, off Apres/ off Zarox; lab 2/17 w/ K=4.7 (norm renal & BS); Note> allergic to ACE w/ angioedema of lips in past; BP= 102/70 today w/o CP etc...    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   - Cardiac MRI (2013) with EF 52%, no delayed enhancement.   - LHC in 2013 without significant CAD.  - 11/15 echo with EF 40-45%.   - 7/16 echo with EF 40-45%, grade II diastolic dysfunction (Last 2DEcho 7/16 showed EF=40-45% w/ diffuse HK, Gr2DD, norm valves, norm LA size; PA & RV are wnl)  - ETT (8/16) with poor exercise tolerance but no ischemia.   - CPX (12/16): peak VO2 15.7 (23.5 adjusted for ideal body weight), VE/VCO2 slope 21, RER 1.15 => moderate functional limitation thought to be primarily due to restrictive lung physiology.     Eating disorder/ Morbid Obesity> unusual hx of eating disorder w/ ANOREXIA as teen=> OBESITY w/ wt=204#, 5' Tall, BMI=39-40; she notes hx anorexia/ depression when in high school w/ lowest wt=80 lbs; psyche rx & counseling resulted in wt gain & she was ~150 lbs in college; gained to 180# after college & lost wt to 165 prior to her marriage at age 7 (she felt better when wt was down); now she is 36 y/o & has gained to max 204#...    Hypothyroidism> on Synthroid88; Labs 2/17 showed TSH=2.19...    Hx IBS & s/p GB in 2013> aware...    Scoliosis & back pain> she was prev evaluated at [facility] by a back specialist, she does not recall the details, no records available, she notes legs get tired, she is not falling, notes that her hands shake on occas... This is contrib to her pulmonary restriction and dyspnea => need back films then consider referral the [facility] for their review & recommendations...    VitB12 defic> Labs showed B12 blood level 179-248 and she is rec to take B12 1081mg orally Qd w/ recheck levels; Labs 2/17 showed B12=313    Anxiety/ depression> on Klonopin 154mBid at present & uses Ativan1m10mrn... EXAM reveals Afeb, VSS, O2sat=98% on RA at rest; 5'  Tall, wt=188#, BMI=36-37;  HEENT- neg, mallampati 2, voice sl hoarse;  Chest- decr BS bilat, decr chest wall movement and decr diaph excursions by percussion, occas sign breaths, clear;  Heart- RRR w/p m/r/g;  Ext- no c/c/e.  Cardio-Pulm exercise test 04/13/15 by DrMcLean>  Severe restrictive dis w/ decr in MVV; O2 desat to 87% w/ peak exercise...   Ambulatory Oximetry 06/20/15> O2sat=96% on RA at rest w/ pulse=89/min; she ambulated 3 laps on RA w/ lowest O2sat=90% w/ pulse=121/min...  CPAP download from 05/21/15=>06/19/15 showed 28/30d >4H, averaging 5-6H per night, Autoset5-15 w/ pressures 6-10cm & AHI=0;  rec to continue same... IMP/PLAN>>  She will continue same meds, continue her [facility] & [facility]; we  refilled her Ativan to use prn for her panic attacks which are undoubtedly playing a roll in her dyspnea events...   ~  November 15, 2015:  5mo ROV & Jonni continues in the Cardiac Rehab maintenance program where she has plateaued but still working hard; Approx 1mo ago she fell w/ pain in back & knee, she saw DrZSmith & benefitted from brief course of Pred;  She reports incr SOB, chest feels tight, some wheezing noted esp in hot humid weather;  On Symbicort160-2spBid, AlbutHFA prn, she notes that AC helps more, notes Singulair didn't help in the past; we decided to add INCRUSE one puff daily & reminded to use her IS regularly for the restrictive lung component... See prob list above...    EXAM reveals Afeb, VSS, O2sat=98% on RA at rest; 5' Tall, wt=186#, BMI=36;  HEENT- neg, mallampati 2, voice sl hoarse;  Chest- decr BS bilat, decr chest wall movement and decr diaph excursions by percussion, occas sign breaths, clear;  Heart- RRR w/p m/r/g;  Ext- no c/c/e.  CPAP download 6/10 - 11/13/15>>  CPAP used 30/30 days, ave ~6H per night, Autoset 5-15, ave pressure= 11-12, working very well w/ AHI=0.1, small leak...  LABS 11/15/15>  CBC- Hg=12.9, mcv=87, wbc=19.1, eos=1%;   IgE=61;  RAST panel- MOD for molds, BORDERLINE for grasses/ few trees/ ragweed... IMP/PLAN>>  She is getting B12 shots from her PCP- [facility] Diaz;  We discussed continuing the Symbicort160-2spBid & AlbutHFA rescue inhaler prn; We are adding Incruse once daily; Reminded to use the IS frequently to expand the lungs w/ her chest wall restricted dis...  ~  February 15, 2016:  3mo ROV & Meredith Diaz' 88 y/o grandmother passed away suddenly last month (MI after femoral hernia surg);  We reviewed mult medical problems during today's office visit >>     She saw Meredith Diaz 01/02/16 for medical f/u- note reviewed, mood stable on Cymbalta & klonopin, exercising at [facility] rehab, using CPAP (she wanted another sleep test since she's lost 20 lbs), taking B12, taking thyroid;  She is also followed by DrZSmith- seen 12/2015 w/ several issues and his note is reviewed (back pain improved and neuropathy symptoms better on Neurontin)...    She reports that her breathing has been OK but the humidity is a particular prob for her- she was seen by PCP 01/13/16 w/ incr chest congestion, cough & SOB, T99.5, incr use of rescue inhaler, chest was reported clear & she was Dx w/ sinusitis & givenAugmentin, Pred, Tussionex    Hx asthma> on Symbicort160- 2sp daily, Spiriva Respimat-2sp/d, VentolinHFA rescue prn- using 1-2x/wk; PFT 4/16 w/ severe restriction, + small airways dis & min response to bronchodil; asked to continue inhalers for now...    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    Hx HBP> on MetopER50, Diovan40Bid, Demadex20, Aldactone25, K20Bid, off Apres/ off Zarox; lab 2/17 w/ K=4.7 (norm renal & BS); Note> allergic to ACE w/ angioedema of lips in past; BP= 110/80 today w/o CP etc...    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    MEDICAL   Issues> Eating disorder w/ anorexia as teen=> obesity as adult; hypothy, IBS, scoliosis & back pain, B12 defic, anxiety/ depression...     EXAM reveals Afeb, VSS, O2sat=98% on RA at rest; 5' Tall, wt=194#, BMI=37;  HEENT- neg, mallampati 2, voice sl hoarse;  Chest- decr BS bilat, decr chest wall movement and decr diaph excursions by percussion, occas sigh breaths, clear;  Heart- RRR w/p m/r/g;  Ext- no c/c/e. IMP/PLAN>>  Meredith Diaz is stable on her current regimen- we reviewed regular dosing of her inhalers, taking meds regularly, continued cards rehab exercises and DIET for weight reduction;  She would like some [id number] to keep on hand this winter- OK; we plan rov 61mo sooner if needed prn...  ~  August 16, 2016:  661moOV & Meredith Diaz doing satis overall w/ her mult problems- no new complaints or concerns but there are mult issues to follow up today>>    She saw DrMcLean for CARDS in the CHF clinic 03/16/16> note reviewed, hx nonischemic cardiomyopathy, most recent Echo w/ EF=40-45% & diffuse HK, poor exercise tolerance, c/o SOB/ fatigue out of proportion to her CHF & he incr Demadex referred her to Rheum...    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    She now indicates that she is INTOL to CPAP, full face mask interface & she stopped it all on her own ~12/17 (didn't call etc), but she claims that she is resting well at night, sleeps thru the night, wakes refreshed, & not napping or  showing daytime hypersomnolence; she is also NOT using her O2 at night, says her husb does not c/o snoring, etc => needs ONO on RA now...    DYSPNEA (DOE)- multifactorial> ?asthma is a small component w/ restrictive dis due to obesity & scoliosis as a major factor, and CHF, deconditioning, anxiety as additional factors...    OSA, nocturnal hypoxemia>  Mod OSA on sleep study 4/16 w/ AHI=20/hr and REM AHI=54, mod snoring, desaturation to 64%; started on CPAP via [employer], auto 5-15 w/ good compliance => AHI <1/hr on the CPAP autoset 5-15, she changed nasal pillows to full face mask & download OK but f/u ONO w/ signif desats despite the CPAP- started on O2 2L/min bled into the machine... Then in Dec2017 she stopped CPAP on her own stating mask interface problems, too much noise, etc; currently NOT using CPAP or her O2 at night & states resting well, wakes refreshed, no daytime symptoms, & apparently not snoring => needs ONO on RA now...    Hx asthma> on Symbicort160- 2sp daily, Spiriva Respimat-2sp/d, VentolinHFA rescue prn- using 1-2x/wk; PFT 4/16 w/ severe restriction, + small airways dis & min response to bronchodil; asked to continue inhalers for now...    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    Hx HBP> on MetopER50, Diovan40Bid, Demadex20-2/d, Aldactone25, K20Bid; Note> allergic to ACE w/ angioedema of lips in past; BP= 114/70 today w/o CP etc...    ?Hx congenital heart disease w/ surg in childhood- [facility] [place] [place], details are not known, [facility]  [facility] ?Goldenhar Syndrome...    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    MEDICAL Issues> Eating disorder w/ anorexia as teen=> obesity as adult; hypothy, IBS, scoliosis & back pain, B12 defic, anxiety/ depression; followed by Meredith Diaz...  EXAM reveals Afeb, VSS, O2sat=98% on RA at rest; 5' Tall, wt=200#, BMI=39;  HEENT- neg, mallampati 2, voice sl hoarse;  Chest- decr BS bilat, decr chest wall movement and decr diaph excursions by percussion, occas sigh breaths, clear;  Heart- RRR w/p m/r/g;  Ext- no c/c/e.  2DEcho 11/17 showed mildly reduced EF=45-50%, diffuse HK, diastolic parameters were wnl, valves OK, RV- ok but unable to est PAsys...  CXR 07/04/16 by PCP (independently reviewed by me in the PACS system) showed mild cardiomeg, sl eventration of left hemidiaph posteriorly, no infiltrates or effusions, NAD...  LABS 1/18 by Rheum>  elev Sed=52,  Elev CRP=38;  CPK- wnl... IMP/PLAN>>  Shaiann has stopped her CPAP & noct O2 on her own due to prob w/ mask intrerface & noice from the machine; she feels that she is doing fine off the CPAP but prev studies showed mod OSA & signif O2desat at night=> we will check ONO on RA (as she has been for the last 41mo) ASAP to see if she needs the O2 by [place] at night;  She is asked to get on diet, get wt down, [facility]...  ~  ADDENDUM:  ONO on RA done 08/19/16>  It showed a nadir O2sat of 56% and she spent <88% qualifying her for nocturnal O2 at 2L/min Qhs => pt instructed to start back on her nocturnal O2 at 2+L/min Qhs...   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    Elluis has a hx asthma + restrictive lung physiology due to her scoliosis/ chest wall deformity + obesity, along w/ exercise & nocturnal hypoxemia, and OSA on sleep study 4/16;  All this produces multifactorial dyspnea which appeared to stabilize on Rx w/ Klonopin=> Ativan, Symbicort160, Spiriva, Albut HFA prn, Diet w/ wt loss heavily recommended + a regular exercise program; In addition she was started on CPAP & initially did very well... Unfortunately she has NOT been about to diet effectively or lose any weight (recall her unusual hx of anorexia in high school & subseq conversion to morbid obesity in adult life), and she has yet to join a gym/ exercise program (she did cardiac rehab in  1448-1856 & maint program thru 06/2016 and quit) or do it on her own;  She has also stopped using her CPAP on her own in late 2017 even after changing face mask to nasal pillows=> she declined to restart & we checked ONO again documenting nocturnal hypoxemia=> told to use O2 at 2L/min by [place] reularly...  NOTE: husb confirms today that she is still snoring a lot & NOT resting well- I offetred her a sleep consult but she declines and says she will restart her home CPAP=> offered appt w/ sleep lab Lynnae Sandhoff) for mask desensitization etc...     We reviewed the following medical problems during today's office visit >>     DYSPNEA (DOE)- multifactorial> ?asthma is a small component w/ restrictive dis due to obesity & scoliosis as a major factor, and CHF, deconditioning, anxiety as additional factors... we reviewed need for diet, wt reduction, & reg exercise program!  She has Ativan '1mg'$  to take  1/2 - 1tab tid prn anxiety...    OSA, nocturnal & exercise hypoxemia>  Mod OSA on sleep study 4/16 w/ AHI=20/hr and REM AHI=54, mod snoring, desaturation to 64%; started on CPAP via [employer], auto 5-15 w/ good compliance => AHI <1/hr on the CPAP autoset 5-15, she changed nasal pillows to full face mask & download OK but f/u ONO w/ signif desats despite the CPAP- started on O2 2L/min bled into the machine... Then in Dec2017 she stopped CPAP on her own stating mask interface problems, too much noise, etc; currently NOT using CPAP or her O2 at night & states resting well, wakes refreshed, no daytime symptoms, & she says "not snoring" but husb contradicts => needs ONO on RA confirmed continued desat & asked to restart both CPAP & O2 at 2L/min Qhs... 10/18 she indicated that she would restart her CPAP rather than see one of our sleep specialists for consult...    Hx asthma> on Symbicort160- 2sp daily, off Spiriva/ Incruse due to cost, VentolinHFA rescue prn- using 1-2x/wk; PFT 4/16 w/ severe restriction, + small airways dis & min response to bronchodil; ok to stop anticholinergics...    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    Hx HBP> on MetopER50, Diovan40Bid, Demadex20-2/d, Aldactone25, K20Bid; Note> allergic to ACE w/ angioedema of lips in past; BP= 116/78 today w/o CP etc...    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    MEDICAL Issues> Eating disorder w/ anorexia as teen=> obesity as adult; hypothy, IBS, scoliosis & back pain, chr pain syndrome, B12 defic, anxiety/ depression; followed by Meredith Diaz; s/p Rheum eval DrBeekman- prob FM/CFS EXAM reveals Afeb, VSS, O2sat=93% on RA at rest; 5' Tall, wt=204#, BMI=39;  HEENT- neg, mallampati 2, voice sl hoarse;  Chest- decr BS bilat, decr chest wall movement and decr diaph excursions by percussion, occas sigh breaths, clear;  Heart- RRR w/p m/r/g;  Abd- soft, nontender, neg;  Back- scoliosis deformity;  Ext- no  c/c/e. IMP/PLAN>>  We had a long talk w/ pt, husb & myself regarding her multisystem dis & the need to take her meds & use her O2 regularly + use her CPAP nightly, + get on track w/ diet & exercise to lose weight!  She agrees to restart the CPAP & will call me in 50moto consider check CPAP download,vs desens w/ VLynnae Sandhoff vs. Sleep consult... Note:  >50% of this 362m ROV was spent in counseling & coordination of care...    Past Medical History  Diagnosis Date    DYSPNEA> multifactorial- restrictive lung dis, morbid obesity, deconditioning, etc OSA> Sleep study 08/2014 w/ AHI=20, worse in REM, desat to 64%... Hx anorexia => morbid obesity...    . Chronic diastolic congestive heart failure, NYHA class 2 07/2011    Echo: EF was 25-30%; No Sig CAD on CATH; No significant finding on Cardiac MRI; follow-up Ech0 2014 --  EF of 50%   Restrictive pulm physiology due to combination of scoliosis/ chest wall deformity/ morbid obesity => severe restriction by PFT   . Hx Asthma but mostly restriction on PFTs - no better on Symbicort160-2spBid + Spiriva vs Incruse once daily (we stopped the latter med)   . Thyroid disease  >>  On Synthroid50, TSH 12/15 = 2.62   . Depression   . Anxiety   . Essential hypertension   . Stomach problems   . Hypertension   . 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 Cardiomyopathy     Unclear etiology. Last EF by echo 40-45% with global hypokinesis, mild LVH possible apical variant hypertrophic cardiomyopathy        B12 Deficiency >> on oral B12 supplement 100012md;  Labs showed B12 level 178 => 313    Past Diaz History:  Procedure Laterality Date  . CARDIAC CATHETERIZATION  08/2011   Normal Coronaries - LVEDP 32 mmHg  . Cardiac MRI  April 2013   No suggestion of a congenital abnormality; EF ~ 52% - no regional wall motion abnormalities., no increased myocardial signal intensity. No perfusion abnormality.  . CHOLECYSTECTOMY  2013  . TRANSTHORACIC ECHOCARDIOGRAM  07/2011   Moderate concentric LVH, mildly dilated. EF 25-38-18% diastolic dysfunction parameters to suggest elevated LAP  . 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 Normal wall thickness (compared to prior reading of possible apical LVH), grade 2 DD -- with elevated LV filling pressures (however left atrium was read as normal size), normal artery function and size. No no suggestion of pulmonary hypertension    Outpatient Encounter Prescriptions as of 02/18/2017  Medication Sig  . albuterol (VENTOLIN HFA) 108 (90 Base) MCG/ACT inhaler Inhale 1-2 puffs into the lungs every 6 (six) hours as needed for wheezing or shortness of breath.  . budesonide-formoterol (SYMBICORT) 160-4.5 MCG/ACT inhaler INHALE 2 PUFFS INTO THE LUNGS TWICE DAILY  . calcium carbonate (TUMS - DOSED IN MG ELEMENTAL CALCIUM) 500 MG chewable tablet Chew 1 tablet by mouth daily as needed for indigestion or heartburn. Reported on 06/15/2015  . Cholecalciferol (VITAMIN D) 2000 units CAPS Take by mouth daily.  . clobetasol cream (TEMOVATE) 0.02.99Apply 1 application topically as needed (for Sclerosis).    . DULoxetine (  CYMBALTA) 60 MG capsule TAKE 1 CAPSULE (60 MG TOTAL) BY MOUTH DAILY.  [name] ibuprofen (ADVIL,MOTRIN) 200 MG tablet Take 400 mg by mouth every 6 (six) hours as needed for moderate pain.  [name] levothyroxine (SYNTHROID, LEVOTHROID) 125 MCG tablet Take 1 tablet (125 mcg total) by mouth daily. NEED OFFICE VISIT FOR FURTHER REFILLS  . LORazepam (ATIVAN) 1 MG tablet Take 0.5-1 tablets (0.5-1 mg total) by mouth 3 (three) times daily as needed for anxiety.  . metoprolol succinate (TOPROL-XL) 50 MG 24 hr tablet TAKE 1 TABLET (50 MG TOTAL) BY MOUTH DAILY. TAKE WITH OR IMMEDIATELY FOLLOWING A MEAL.  [name] potassium chloride SA (K-DUR,KLOR-CON) 20 MEQ tablet Take 2 tablets (40 mEq total) by mouth daily.  [name] spironolactone (ALDACTONE) 25 MG tablet TAKE 1 TABLET EVERY DAY  . torsemide (DEMADEX) 20 MG tablet Take 2 tablets (40 mg total) by mouth daily. Alternating with 20 mg daily  . torsemide (DEMADEX) 20 MG tablet Take 2 tablets (40 mg total) by mouth daily. Needs office visit  . traZODone (DESYREL) 50 MG tablet Take 1/2-1 tablet by mouth in evening as needed for sleep  . valsartan (DIOVAN) 40 MG tablet TAKE 1 TABLET (40 MG TOTAL) BY MOUTH 2 (TWO) TIMES DAILY.  . budesonide-formoterol (SYMBICORT) 160-4.5 MCG/ACT inhaler Inhale 2 puffs into the lungs 2 times daily at 12 noon and 4 pm.  . [DISCONTINUED] cholestyramine light (PREVALITE) 4 GM/DOSE powder Take 4 g by mouth as needed (DIARRHEA DUE TO BILE ACIDS).   . [DISCONTINUED] cyclobenzaprine (FLEXERIL) 5 MG tablet Take 1 tablet (5 mg total) by mouth 3 (three) times daily as needed for muscle spasms. (Patient not taking: Reported on 02/18/2017)  . [DISCONTINUED] doxycycline (VIBRA-TABS) 100 MG tablet Take 1 tablet (100 mg total) by mouth 2 (two) times daily. (Patient not taking: Reported on 02/18/2017)  . [DISCONTINUED] HYDROcodone-acetaminophen (NORCO/VICODIN) 5-325 MG tablet Take 1 tablet by mouth every 6 (six) hours as needed for moderate pain. (Patient not  taking: Reported on 02/18/2017)  . [DISCONTINUED] predniSONE (STERAPRED UNI-PAK 21 TAB) 10 MG (21) TBPK tablet Take 6 tablets x 1 day, 5 tablets x 1 day, 4 tablets x 1 day, 3 tablets x 1 day, 2 tablets x 1 day, 1 tablet x 1 day (Patient not taking: Reported on 02/18/2017)  . [DISCONTINUED] Tiotropium Bromide Monohydrate (SPIRIVA RESPIMAT) 2.5 MCG/ACT AERS Inhale 2 puffs into the lungs daily. (Patient not taking: Reported on 02/18/2017)   No facility-administered encounter medications on file as of 02/18/2017.     Allergies  Allergen Reactions  . Lisinopril Swelling    Angioedema facial, mostly just lips, no trouble breathing    Immunization History  Administered Date(s) Administered  . Influenza-Unspecified 01/15/2014, 02/04/2015, 02/14/2015, 02/07/2016  . Tdap 09/26/2015    Current Medications, Allergies, Past Medical History, Past Diaz History, Family History, and Social History were reviewed in [employer] record.   Review of Systems             All symptoms NEG except where BOLDED >>  Constitutional:  Denies F/C/S, anorexia, unexpected weight change. HEENT:  No HA, visual changes, earache, nasal symptoms, sore throat, hoarseness. Resp:  cough, sputum, hemoptysis; SOB, tightness, wheezing. Cardio:  CP, palpit, DOE, orthopnea, edema. GI:  Denies N/V/D/C or blood in stool; no reflux, abd pain, distention, or gas. GU:  No dysuria, freq, urgency, hematuria, or flank pain. MS:  Denies joint pain, swelling, tenderness, or decr ROM; no neck pain, back pain, etc. Neuro:  No  tremors, seizures, dizziness, syncope, weakness, numbness, gait abn. Skin:  No suspicious lesions or skin rash. Heme:  No adenopathy, bruising, bleeding. Psyche: Denies confusion, sleep disturbance, hallucinations, anxiety, depression.   Objective:   Physical Exam    Vital Signs:  Reviewed...   General:  WD, overweight, 36 y/o WF in NAD; alert & oriented; pleasant &  cooperative... HEENT:  [place]/AT; Conjunctiva- pink, Sclera- nonicteric, EOM-wnl, PERRLA, EACs-clear, TMs-wnl; NOSE-clear; THROAT-clear & wnl.  Neck:  Supple w/ fair ROM; no JVD; normal carotid impulses w/o bruits; no thyromegaly or nodules palpated; no lymphadenopathy.  Chest:  Clear to P & A; without wheezes, rales, or rhonchi heard (decr BS at bases) Heart:  Regular Rhythm; gr1/6 SEM, without rubs or gallops detected. Abdomen:  Obese, soft, nontender- no guarding or rebound; normal bowel sounds; no organomegaly or masses palpated. Ext:  Normal ROM; without deformities or arthritic changes; no varicose veins, +venous insuffic, tr edema;  Pulses intact w/o bruits. Neuro:  No focal neuro deficits, gait normal & balance OK. Derm:  No lesions noted; no rash etc. Lymph:  No cervical, supraclavicular, axillary, or inguinal adenopathy palpated.   Assessment:         DYSPNEA (DOE)- multifactorial>     OSA, nocturnal & exercise hypoxemia>  She stopped CPAP on her own 12/17 & finally agreed to restart 10/18...    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    MEDICAL Issues> Eating disorder w/ anorexia as teen=> obesity as adult; hypothy, IBS, scoliosis & back pain, chr pain syndrome, B12 defic, anxiety/ depression; followed by Meredith Diaz; s/p Rheum eval DrBeekman- prob FM/CFS   PLAN>>  She has a very complex medical situation as outlined above, and we are hampered by lack of old records (I have asked her to help in getting this data);  For now she will continue the Symbicort, Ventolin, Klonopin, & CPAP; She knows the importance of wt reduction & may want to consider counseling from Dr. Thomes Dinning for eating disorder;  Continue  cardiac meds and f/u w/ Meredith Diaz;  She is only 36 y/o w/ signif pulm restriction & heart disease... 12/14/14> Tiffani will soon start cardiac rehab w/ it's exercise & nutritional counseling- it is imperitive that she get the weight down!!!  We will check CPAP download on full face mask & then ONO on the CPAP set up to be sure that she is not desaturating... ROV planned 3 months 03/17/15> She has severe pulmonary restriction- obesity, scoliosis, ankylosis;  Weight is down to 187# on diet & cards rehab exercises;  rec to continue current meds and we will try Desyrel 25=50 Qhs for sleep. 06/20/15>  She will continue same meds, continue her diet & exercise at [place] keep record of O2sats there; we refilled her Ativan to use prn for her panic attacks which are undoubtedly playing a roll in her dyspnea. 11/15/15>   She is getting B12 shots from her PCP- Meredith Diaz;  We discussed continuing the Symbicort160-2spBid & AlbutHFA rescue inhaler prn; We are adding Incruse once daily; Reminded to use the IS frequently to expand the lungs w/ her chest wall restricted dis. 02/15/16>   Monaye is stable on her current regimen- we reviewed  regular dosing of her inhalers, taking meds regularly, continued cards rehab exercises and DIET for weight reduction;  She would like some Augmentin875 to keep on hand this winter- OK. 08/16/16>    Calia has stopped her CPAP & noct O2 on her own due to prob w/ mask intrerface & noice from the machine; she feels that she is doing fine off the CPAP but prev studies showed mod OSA & signif O2desat at night=> we will check ONO on RA (as she has been for the last 57mo ASAP to see if she needs the O2 by [place] at night;  She is asked to get on diet, get wt down, [facility]... 02[facility]18/17>    We had a long talk w/ pt, husb & myself regarding her multisystem dis & the need to take her meds & use her O2 regularly + use her CPAP nightly, + get on track w/ diet & exercise to lose weight!  She  agrees to restart the CPAP & will call me in 121moo consider check CPAP download,vs desens w/ VeLynnae Sandhoffvs. Sleep consult...     Plan:     Patient's Medications  New Prescriptions   BUDESONIDE-FORMOTEROL (SYMBICORT) 160-4.5 MCG/ACT INHALER    Inhale 2 puffs into the lungs 2 times daily at 12 noon and 4 pm.  Previous Medications   ALBUTEROL (VENTOLIN HFA) 108 (90 BASE) MCG/ACT INHALER    Inhale 1-2 puffs into the lungs every 6 (six) hours as needed for wheezing or shortness of breath.   BUDESONIDE-FORMOTEROL (SYMBICORT) 160-4.5 MCG/ACT INHALER    INHALE 2 PUFFS INTO THE LUNGS TWICE DAILY   CALCIUM CARBONATE (TUMS - DOSED IN MG ELEMENTAL CALCIUM) 500 MG CHEWABLE TABLET    Chew 1 tablet by mouth daily as needed for indigestion or heartburn. Reported on 06/15/2015   CHOLECALCIFEROL (VITAMIN D) 2000 UNITS CAPS    Take by mouth daily.   CLOBETASOL CREAM (TEMOVATE) 0.05 %    Apply 1 application topically as needed (for Sclerosis).    DULOXETINE (CYMBALTA) 60 MG CAPSULE    TAKE 1 CAPSULE (60 MG TOTAL) BY MOUTH DAILY.   IBUPROFEN (ADVIL,MOTRIN) 200 MG TABLET    Take 400 mg by mouth every 6 (six) hours as needed for moderate pain.   LEVOTHYROXINE (SYNTHROID, LEVOTHROID) 125 MCG TABLET    Take 1 tablet (125 mcg total) by mouth daily. NEED OFFICE VISIT FOR FURTHER REFILLS   LORAZEPAM (ATIVAN) 1 MG TABLET    Take 0.5-1 tablets (0.5-1 mg total) by mouth 3 (three) times daily as needed for anxiety.   METOPROLOL SUCCINATE (TOPROL-XL) 50 MG 24 HR TABLET    TAKE 1 TABLET (50 MG TOTAL) BY MOUTH DAILY. TAKE WITH OR IMMEDIATELY FOLLOWING A MEAL.   POTASSIUM CHLORIDE SA (K-DUR,KLOR-CON) 20 MEQ TABLET    Take 2 tablets (40 mEq total) by mouth daily.   SPIRONOLACTONE (ALDACTONE) 25 MG TABLET    TAKE 1 TABLET EVERY DAY   TORSEMIDE (DEMADEX) 20 MG TABLET    Take 2 tablets (40 mg total) by mouth daily. Alternating with 20 mg daily   TORSEMIDE (DEMADEX) 20 MG TABLET    Take 2 tablets (40 mg total) by mouth daily. Needs office  visit   TRAZODONE (DESYREL) 50 MG TABLET    Take 1/2-1 tablet by mouth in evening as needed for sleep   VALSARTAN (DIOVAN) 40 MG TABLET    TAKE 1 TABLET (40 MG TOTAL) BY MOUTH 2 (TWO) TIMES DAILY.  Modified  Medications   No medications on file  Discontinued Medications   CHOLESTYRAMINE LIGHT (PREVALITE) 4 GM/DOSE POWDER    Take 4 g by mouth as needed (DIARRHEA DUE TO BILE ACIDS).    CYCLOBENZAPRINE (FLEXERIL) 5 MG TABLET    Take 1 tablet (5 mg total) by mouth 3 (three) times daily as needed for muscle spasms.   DOXYCYCLINE (VIBRA-TABS) 100 MG TABLET    Take 1 tablet (100 mg total) by mouth 2 (two) times daily.   HYDROCODONE-ACETAMINOPHEN (NORCO/VICODIN) 5-325 MG TABLET    Take 1 tablet by mouth every 6 (six) hours as needed for moderate pain.   PREDNISONE (STERAPRED UNI-PAK 21 TAB) 10 MG (21) TBPK TABLET    Take 6 tablets x 1 day, 5 tablets x 1 day, 4 tablets x 1 day, 3 tablets x 1 day, 2 tablets x 1 day, 1 tablet x 1 day   TIOTROPIUM BROMIDE MONOHYDRATE (SPIRIVA RESPIMAT) 2.5 MCG/ACT AERS    Inhale 2 puffs into the lungs daily.

## 2017-02-18 NOTE — Patient Instructions (Signed)
Today we updated your med list in our EPIC system...    Continue your current medications the same...  We decded to STOP the Spiriva medication, and continue the Symbicort & Albuterol rescue inhaler...  We reviewed how DIET + EXERCISE were the keys to breathing better in the future...    Continue to work seriously on weight reduction...  We discussed trying the CPAP again (with oxygen) & letting me know how it's working in a month or so...    Check w/ your husb regarding the snoring...  Call for any questions...  Let's plan a follow up visit in 25m20oWille CelesNovamed Surgery Center Of Cleveland LLGlendCliftonJonny RuizJ([id number] Health CentBaylor Surgicare At GranbuOsvalCandelariTrenton Psychiatri5mo74 Wille CelesMichigan Surgical Center LLECliftonJonny RuizJ([id number] Health Surgery Center IrviOrthopaedics Specialists Surgi CentOsvalCandelariFairview Developmen25mo9aWille CelesLindsay Municipal HospitaLouisviCliftonJonny [id number] Med Ctr Thief Rvr FaMorganton Eye PhysiciOsvalCandelariRocky Mountain Endoscopy C10mo32nWille CelesUrlogy Ambulatory Surgery Center LLVinelCliftonJonny [id number] Gastroenterology Mclaren Greater LOsvalCandelariFairmon41mo26 Wille CelesAmbulatory Surgery Center Of Cool Springs LLThe University of Virginia's College at WCliftonJonny Rui79zJ685Rockingham Memorial HospitNortheast Rehabilitation HoOsvalCandelariNew England Baptis52mo11 Wille CelesForbes HospitaSandyfiCliftonJonny [id number] Rehabilitation CentEndoscopy Center Of The CentralOsvalCandelariEndoscopy Center Of Dayton31mo72NWille CelesSt Francis HospitaSCliftonJonny Rui25zJ634[address] Gastroenterology IAtrium Health OsvalCandelariAdventhealth Ce62mo89tWille CelesPlatte Valley Medical CenteVaCliftonJonny Rui73zJ405Henrietta D Goodall HospitEncompass Health Rehabilitation Hospital Of Altamonte SOsvalCandelariJohn & Mary Kirb20mo58 Wille CelesCsa Surgical Center LLKenneCliftonJonny [id number] Medical Center - Brooklyn CampKaweah Delta Mental Health Hospital DOsvalCandelariHosp General Menonit64mo23 Wille CelesAlliancehealth WoodwarChurch PoCliftonJonny [id number] Center Of Fremont LHavasu Regional Medical OsvalCandelariAlameda Surgery41mo68CWille CelesFranciscan Alliance Inc Franciscan Health-Olympia FallManCliftonJonny Rui110zJ563Galion Community HospitLeesville Rehabilitation HoOsvalCandelariSaint 47mo46oWille CelesTexas Neurorehab CenteCaCliftonJonny [id number] Medical GroSwall Medical CorpoOsvalCandelariEllicott City Ambulatory Surgery C107mo15nWille CelesPlastic Surgical Center Of MississippCollege PlCliftonJonny RuizJ([id number])Houston Methodist Willowbrook HospitWheatland Memorial HealOsvalCandelariLaureate Psychiatric Clinic An75mo59 Wille CelesSt Catherine'S West Rehabilitation HospitaHudCliftonJonny RuizJ([id number] Va Medical CentMidtown Medical CenteOsvalCandelariTyler Count77mo23 Wille CelesWoodlands Endoscopy CenteJoppatoCliftonJonny [id number] Of Central [place] ICrosstown Surgery CentOsvalCandelariBaton Rouge General Medical Center (B108mo58uWille CelesParkview Adventist Medical Center : Parkview Memorial HospitaPerCliftonJonny RuizJ([id number])ScneCallaway District HoOsvalCandelariNorthwest Orthopaedic Spe36mo84iWille CelesToms River Surgery CenteLas VeCliftonJonny [id number] Tn Endoscopy Asc LMeade District HoOsvalCandelariHolly Springs Surgery 25mo86eWille CelesUniversity Of Texas Southwestern Medical CentePaint RCliftonJonny [id number] Pointe HospitChevy Chase Endoscopy OsvalCandelariBeaumont Hospi41mo52aWille CelesPoplar Springs HospitaEvansviCliftonJonny [id number] Endoscopy CentChildren'S HoOsvalCandelariAscension Seton Medical Center 84mo68iWille CelesDenver West Endoscopy Center LLHopkCliftonJonny Rui98zJ287Genesis Health System Dba Genesis Medical Center - SilvSgt. John L. Levitow Veteran'S Health OsvalCandelariAmerican Recov65mo66rWille CelesKaiser Foundation Hospital - San LeandrBuCliftonJonny [id number] Regional HospitNovamed Surgery Center Of DenvOsvalCandelariMercy Hospit71mo63lWille CelesCollege Medical CenteEmpCliftonJonny Rui74zJ608Carolinas Medical Center-MerBrand Surgery CentOsvalCandelariAdvent Healt32mo54 Wille CelesChristian Hospital Northeast-NorthwesGauley BriCliftonJonny RuizJ([id number] Surgical AssociatMohawk Valley OsvalCandelariSelect Specialty Hospital81mo40-Wille CelesFour Seasons Surgery Centers Of Ontario LLesCliftonJonny RuizJ([id number] Ec LNorthern Michigan Surgical OsvalCandelariQuality Care Clinic And S55mo79rWille CelesMclaren OaklanTitusviCliftonJonny RuizJ([id number])Gastroenterology Consultants Of [place] IFlorida Orthopaedic Institute Surgery CentOsvalCandelariThe Medical Center Of Sout48mo34eWille CelesUhhs Richmond Heights HospitaGeorgetCliftonJonny RuizJ456832Lincoln Regional CentParkwest Medical OsvalCandelariDoctors Center Hospital Sanfernando D36mo73 Wille CelesMorledge Family Surgery CenteClevelCliftonJonny [id number] ChildrenWest Marion Community HoOsvalCandelariAndochick Surgical 46mo13eWille CelesApple Surgery CenteOak GrCliftonJonny [id number] Ambulatory Surgery CentIraan General HoOsvalCandelariSeton Medical Center Har36mo40eWille CelesBeltway Surgery Centers LLPalCliftonJonny [id number] Regional Medical CentNorth Platte Surgery CentOsvalCandelariIntegris Southwest Medi90mo84aWille CelesAssencion St. Vincent'S Medical Center Clay CountLibeCliftonJonny [id number] Catherine HospitSanford Health Detroit Lakes Same Day SurgeOsvalCandelariOu Medical Center23mo77EWille CelesKindred Hospital Town & CountrLongviCliftonJonny [id number] State HospitParkway Surgery CentOsvalCandelariAlfred I. Dupont Hospital Fo55mo60 Wille CelesHudson Valley Ambulatory Surgery LLYeguCliftonJonny [id number] HospitWashington Outpatient Surgery CentOsvalCandelariSt. Luke'S Wood River Medi97mo22aWille CelesCimarron Memorial HospitaGreen OCliftonJonny RuizJ363730Casa Colina Surgery CentAdvanced Surgery Center Of Northern LouisiaOsvalCandelariJeff Davi81mo44 Wille CelesSaint Francis Medical CenteGarden City PCliftonJonny [id number] New York Children'S Psychiatric CentKissimmee Endoscopy OsvalCandelariHealthpark Medi24mo45aWille CelesSurgical Center Of Southfield LLC Dba Fountain View Surgery CenteBrycelCliftonJonny [id number] Health Rehabilitation Hospital The WoodlanCorpus Christi Endoscopy CentOsvalCandelariUs Phs Winslow India60mo95 Wille CelesNorthern Virginia Surgery Center LLOak HCliftonJonny RuizJ([id number] HospitFresno Surgical HoOsvalCandelariHouston Va Medi17mo91aWille CelesCohen Children’S Medical CenteFort MitchCliftonJonny RuizJ330695Kalamazoo Endo CentFreeman HospitaOsvalCandelariPark Center, KentuckyInKoreBloggerCourse.comblems...

## 2017-02-19 ENCOUNTER — Other Ambulatory Visit (HOSPITAL_COMMUNITY): Payer: Self-pay | Admitting: Internal Medicine

## 2017-02-23 ENCOUNTER — Other Ambulatory Visit (HOSPITAL_COMMUNITY): Payer: Self-pay | Admitting: Cardiology

## 2017-02-27 ENCOUNTER — Other Ambulatory Visit (HOSPITAL_COMMUNITY): Payer: Self-pay | Admitting: *Deleted

## 2017-03-21 DIAGNOSIS — G4733 Obstructive sleep apnea (adult) (pediatric): Secondary | ICD-10-CM | POA: Diagnosis not present

## 2017-03-27 ENCOUNTER — Telehealth: Payer: Self-pay | Admitting: Pulmonary Disease

## 2017-03-27 MED ORDER — ALBUTEROL SULFATE HFA 108 (90 BASE) MCG/ACT IN AERS: 1.0000 | INHALATION_SPRAY | Freq: Four times a day (QID) | RESPIRATORY_TRACT | 3 refills | Status: DC | PRN

## 2017-03-27 NOTE — Telephone Encounter (Signed)
Spoke with pt and advised rx sent to pharmacy  

## 2017-04-05 ENCOUNTER — Other Ambulatory Visit: Payer: Self-pay | Admitting: Pulmonary Disease

## 2017-04-08 ENCOUNTER — Ambulatory Visit (INDEPENDENT_AMBULATORY_CARE_PROVIDER_SITE_OTHER): Payer: Commercial Managed Care - PPO | Admitting: Family

## 2017-04-08 ENCOUNTER — Encounter: Payer: Self-pay | Admitting: Family

## 2017-04-08 ENCOUNTER — Other Ambulatory Visit (INDEPENDENT_AMBULATORY_CARE_PROVIDER_SITE_OTHER): Payer: Commercial Managed Care - PPO

## 2017-04-08 ENCOUNTER — Ambulatory Visit (INDEPENDENT_AMBULATORY_CARE_PROVIDER_SITE_OTHER)
Admission: RE | Admit: 2017-04-08 | Discharge: 2017-04-08 | Disposition: A | Discharge status: Home / Self Care | Payer: Commercial Managed Care - PPO | Source: Ambulatory Visit | Attending: Family | Admitting: Family

## 2017-04-08 VITALS — BP 108/80 | HR 92 | Temp 98.5°F | Ht 60.0 in | Wt 214.0 lb

## 2017-04-08 DIAGNOSIS — E039 Hypothyroidism, unspecified: Secondary | ICD-10-CM | POA: Diagnosis not present

## 2017-04-08 DIAGNOSIS — R0789 Other chest pain: Secondary | ICD-10-CM

## 2017-04-08 DIAGNOSIS — R079 Chest pain, unspecified: Secondary | ICD-10-CM | POA: Diagnosis not present

## 2017-04-08 LAB — COMPREHENSIVE METABOLIC PANEL
ALBUMIN: 4.4 g/dL (ref 3.5–5.2)
ALK PHOS: 107 U/L (ref 39–117)
ALT: 19 U/L (ref 0–35)
AST: 15 U/L (ref 0–37)
BUN: 14 mg/dL (ref 6–23)
CALCIUM: 9.3 mg/dL (ref 8.4–10.5)
CO2: 32 mEq/L (ref 19–32)
Chloride: 96 mEq/L (ref 96–112)
Creatinine, Ser: 0.82 mg/dL (ref 0.40–1.20)
GFR: 83.3 mL/min (ref >60.00)
Glucose, Bld: 129 mg/dL — ABNORMAL HIGH (ref 70–99)
POTASSIUM: 3.6 meq/L (ref 3.5–5.1)
SODIUM: 139 meq/L (ref 135–145)
TOTAL PROTEIN: 8.4 g/dL — AB (ref 6.0–8.3)
Total Bilirubin: 0.4 mg/dL (ref 0.2–1.2)

## 2017-04-08 LAB — CBC
HCT: 42.7 % (ref 36.0–46.0)
HEMOGLOBIN: 14.2 g/dL (ref 12.0–15.0)
MCHC: 33.4 g/dL (ref 30.0–36.0)
MCV: 91.2 fl (ref 78.0–100.0)
Platelets: 389 10*3/uL (ref 150.0–400.0)
RBC: 4.68 Mil/uL (ref 3.87–5.11)
RDW: 13.8 % (ref 11.5–15.5)
WBC: 16.6 10*3/uL — AB (ref 4.0–10.5)

## 2017-04-08 LAB — BRAIN NATRIURETIC PEPTIDE: Pro B Natriuretic peptide (BNP): 42 pg/mL (ref 0.0–100.0)

## 2017-04-08 NOTE — Progress Notes (Signed)
Meredith Diaz is a 36 y.o. female with the following history as recorded in EpicCare:  Patient Active Problem List   Diagnosis Date Noted  . Genetic testing 01/15/2017  . Family history of breast cancer   . Family history of colon cancer   . Rash 12/17/2016  . Chest pain on breathing 10/09/2016  . Myalgia 10/09/2016  . Nocturnal hypoxemia 08/16/2016  . Fatigue 03/16/2016  . Sinusitis, acute 01/13/2016  . Obesity 11/15/2015  . Left lumbar radiculopathy 10/12/2015  . Contusion of left knee 10/12/2015  . 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 B12 deficiency 09/26/2015  . Tingling in extremities 09/26/2015  . Elevated WBC count 06/17/2015  . Benign paroxysmal positional vertigo 01/29/2015  . Adjustment disorder with mixed anxiety and depressed mood 01/13/2015  . NICM (nonischemic cardiomyopathy) (HCC) 12/03/2014  . Atypical chest pain 12/03/2014  . Idiopathic scoliosis 10/14/2014  . OSA on CPAP 10/14/2014  . Dyspnea 08/16/2014  . Restrictive lung disease 08/16/2014  . Cardiomyopathy (HCC)   . Chronic combined systolic and diastolic heart failure, NYHA class 3 (HCC) 03/09/2014  . Essential hypertension 03/09/2014  . Congenital heart disease, adult   . Hypothyroidism 01/28/2014  . Birth control 01/28/2014  . Asthma, chronic 01/28/2014    Current Outpatient Medications  Medication Sig Dispense Refill  . albuterol (VENTOLIN HFA) 108 (90 Base) MCG/ACT inhaler Inhale 1-2 puffs into the lungs every 6 (six) hours as needed for wheezing or shortness of breath. 18 Inhaler 3  . budesonide-formoterol (SYMBICORT) 160-4.5 MCG/ACT inhaler Inhale 2 puffs into the lungs 2 times daily at 12 noon and 4 pm. 1 Inhaler 0  . budesonide-formoterol (SYMBICORT) 160-4.5 MCG/ACT inhaler INHALE 2 PUFFS INTO THE LUNGS TWICE DAILY 10.2 Inhaler 9  . calcium carbonate (TUMS - DOSED IN MG ELEMENTAL CALCIUM) 500 MG chewable tablet Chew 1 tablet by mouth daily as needed for indigestion or  heartburn. Reported on 06/15/2015    . Cholecalciferol (VITAMIN D) 2000 units CAPS Take by mouth daily.    . clobetasol cream (TEMOVATE) 0.05 % Apply 1 application topically as needed (for Sclerosis).     . DULoxetine (CYMBALTA) 60 MG capsule TAKE 1 CAPSULE (60 MG TOTAL) BY MOUTH DAILY. 30 capsule 6  . ibuprofen (ADVIL,MOTRIN) 200 MG tablet Take 400 mg by mouth every 6 (six) hours as needed for moderate pain.    [name] levothyroxine (SYNTHROID, LEVOTHROID) 125 MCG tablet Take 1 tablet (125 mcg total) by mouth daily. NEED OFFICE VISIT FOR FURTHER REFILLS 90 tablet 0  . LORazepam (ATIVAN) 1 MG tablet Take 0.5-1 tablets (0.5-1 mg total) by mouth 3 (three) times daily as needed for anxiety. 90 tablet 0  . metoprolol succinate (TOPROL-XL) 50 MG 24 hr tablet TAKE 1 TABLET (50 MG TOTAL) BY MOUTH DAILY. TAKE WITH OR IMMEDIATELY FOLLOWING A MEAL. 90 tablet 3  . potassium chloride SA (K-DUR,KLOR-CON) 20 MEQ tablet Take 2 tablets (40 mEq total) by mouth daily. 60 tablet 3  . spironolactone (ALDACTONE) 25 MG tablet TAKE 1 TABLET EVERY DAY 30 tablet 6  . torsemide (DEMADEX) 20 MG tablet Take 2 tablets (40 mg total) by mouth daily. Alternating with 20 mg daily 60 tablet 3  . torsemide (DEMADEX) 20 MG tablet Take 2 tablets (40 mg total) by mouth daily. Needs office visit 60 tablet 1  . traZODone (DESYREL) 50 MG tablet Take 1/2-1 tablet by mouth in evening as needed for sleep 30 tablet 5  . valsartan (DIOVAN) 40 MG tablet Take 1  tablet (40 mg total) by mouth 2 (two) times daily. Needs office visit 60 tablet 1   No current facility-administered medications for this visit.     Allergies: Lisinopril  Past Medical History:  Diagnosis Date  . Anxiety   . Asthma   . Chronic combined systolic and diastolic CHF (congestive heart failure) (HCC) 07/2011   a. Prior Echo: EF was 25-30%; b. No Sig CAD on CATH; c. No significant finding on Cardiac MRI; d. follow-up Echo 2014 --  EF of 50%;  e. 03/2014 Echo: EF 40-45%, gr 1 DD; f.  11/2014 Echo: EF 40-45%, Gr 2 DD.  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  . Depression   . Essential hypertension   . Family history of breast cancer   . Family history of colon cancer   . Morbid obesity (HCC)   . Nonischemic cardiomyopathy (HCC)    a. unclear etiology;  b. 01/2012 Cardiac MRI: no infiltrative pathology, EF 52%;  b. 11/2014 Echo: EF 40-45%, gr 2 DD.  [name] Stomach problems   . Thyroid disease     Past Surgical History:  Procedure Laterality Date  . CARDIAC CATHETERIZATION  08/2011   Normal Coronaries - LVEDP 32 mmHg  . Cardiac MRI  April 2013   No suggestion of a congenital abnormality; EF ~ 52% - no regional wall motion abnormalities., no increased myocardial signal intensity. No perfusion abnormality.  . CHOLECYSTECTOMY  2013  . TRANSTHORACIC ECHOCARDIOGRAM  07/2011   Moderate concentric LVH, mildly dilated. EF 25-30% no diastolic dysfunction parameters to suggest elevated LAP  . 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 Normal wall thickness (compared to prior reading of possible apical LVH), grade 2 DD -- with elevated LV filling pressures (however left atrium was read as normal size), normal artery function and size. No no suggestion of pulmonary hypertension    Family History  Problem Relation Age of Onset  . Depression Mother   . Hypertension Father   . Colon cancer Maternal Grandmother 76  . Heart attack Maternal Grandmother   . Heart disease Maternal Grandfather   . Heart attack Maternal Grandfather   . Heart disease Paternal Grandmother   . Breast cancer Paternal Grandmother 56       possible bilateral cancer  . Asthma Paternal Grandmother   . Heart attack Paternal  Grandmother   . Heart attack Paternal Grandfather   . Breast cancer Maternal Aunt 50  . Breast cancer Paternal Aunt 70  . Stroke Neg Hx     Social History   Tobacco Use  . Smoking status: Never Smoker  . Smokeless tobacco: Never Used  Substance Use Topics  . Alcohol use: No    Subjective:  Patient presents with concerns for RUQ pain x 1 week; notes symptoms are very similar to what she experienced on the left side in June 2018- that was felt to be costochondritis; admits this pain with this episode is not as severe as what she experienced with the episode in June. Denies any known injury or trauma;  Feels that area is tender to palpation- "hurts to touch where my gallbladder used to be." Symptoms are most noticeable with inhalation- feels like pain affects  her ability to take a full deep breath; does feel like she is not "getting as much air recently" but not sure if this is related to her heart failure; does find applying heat to the rib area to be beneficial. No rash noted at area of tenderness.  Of note, she has gained 10 pounds since her visit here in October 2018; admits she is not exercising/ eating as well as she should; feels like her depression is not well controlled- admits she is "stress eating." in general, she notes she "just hasn't been feeling good lately."  Objective:  Vitals:   04/08/17 1304  BP: 108/80  Pulse: 92  Temp: 98.5 F (36.9 C)  SpO2: 95%  Weight: 214 lb (97.1 kg)  Height: 5' (1.524 m)    General: Well developed, well nourished, in no acute distress  Skin : Warm and dry.  Head: Normocephalic and atraumatic  Eyes: Sclera and conjunctiva clear; pupils round and reactive to light; extraocular movements intact  Ears: External normal; canals clear; tympanic membranes normal  Oropharynx: Pink, supple. No suspicious lesions  Neck: Supple without thyromegaly, adenopathy  Lungs: Respirations unlabored; clear to auscultation bilaterally without wheeze, rales,  rhonchi  CVS exam: normal rate, regular rhythm Abdomen: Soft; nontender; nondistended; normoactive bowel sounds; no masses or hepatosplenomegaly  Musculoskeletal: No deformities; no active joint inflammation  Extremities: No edema, cyanosis, clubbing  Vessels: Symmetric bilaterally  Neurologic: Alert and oriented; speech intact; face symmetrical; moves all extremities well; CNII-XII intact without focal deficit   Assessment:  1. Other chest pain   2. Acquired hypothyroidism     Plan:  1. Will update CXR, CBC, CMP, d-dimer, BNP today; ? Muscular source; she will continue applying heat/ ice; do not feel comfortable giving NSAIDs- she defers Tramadol today; follow-up to be determined. 2. Will update thyroid profile today; ? Contributing to weight gain/ depression; will adjust  Medication as needed; patient is asking to be referred to endocrinology.   Spent 30+  Minutes with patient today reviewing symptoms and discussing treatment options/ plans.       No Follow-up on file.  Orders Placed This Encounter  Procedures  . DG Chest 2 View    Standing Status:   Future    Number of Occurrences:   1    Standing Expiration Date:   06/09/2018    Order Specific Question:   Reason for Exam (SYMPTOM  OR DIAGNOSIS REQUIRED)    Answer:   chest pain    Order Specific Question:   Is patient pregnant?    Answer:   No    Order Specific Question:   Preferred imaging location?    Answer:   [facility]    Order Specific Question:   Radiology Contrast Protocol - do NOT remove file path    Answer:   [email or web address]  . Thyroid Profile    Standing Status:   Future    Number of Occurrences:   1    Standing Expiration Date:   04/08/2018  . D-Dimer, Quantitative    Standing Status:   Future    Number of Occurrences:   1    Standing Expiration Date:   04/08/2018  . CBC    Standing Status:   Future    Number of Occurrences:   1    Standing Expiration  Date:   04/08/2018  . Comprehensive metabolic panel    Standing Status:   Future    Number of Occurrences:   1  Standing Expiration Date:   04/08/2018  . B Nat Peptide    Standing Status:   Future    Number of Occurrences:   1    Standing Expiration Date:   04/08/2018    Requested Prescriptions    No prescriptions requested or ordered in this encounter

## 2017-04-09 LAB — THYROID PROFILE - CHCC
Free Thyroxine Index: 2.8 (ref 1.4–3.8)
T3 UPTAKE: 27 % (ref 22–35)
T4 TOTAL: 10.5 ug/dL (ref 5.1–11.9)

## 2017-04-09 LAB — D-DIMER, QUANTITATIVE (NOT AT ARMC): D DIMER QUANT: 0.55 ug{FEU}/mL — AB (ref <0.50)

## 2017-04-10 ENCOUNTER — Other Ambulatory Visit (HOSPITAL_COMMUNITY): Payer: Self-pay | Admitting: Cardiology

## 2017-04-10 ENCOUNTER — Other Ambulatory Visit (INDEPENDENT_AMBULATORY_CARE_PROVIDER_SITE_OTHER): Payer: Commercial Managed Care - PPO

## 2017-04-10 ENCOUNTER — Other Ambulatory Visit: Payer: Self-pay | Admitting: Family

## 2017-04-10 DIAGNOSIS — I519 Heart disease, unspecified: Principal | ICD-10-CM

## 2017-04-10 DIAGNOSIS — E039 Hypothyroidism, unspecified: Secondary | ICD-10-CM | POA: Diagnosis not present

## 2017-04-10 LAB — TSH: TSH: 3.41 u[IU]/mL (ref 0.35–4.50)

## 2017-04-20 DIAGNOSIS — G4733 Obstructive sleep apnea (adult) (pediatric): Secondary | ICD-10-CM | POA: Diagnosis not present

## 2017-04-22 ENCOUNTER — Other Ambulatory Visit (HOSPITAL_COMMUNITY): Payer: Self-pay | Admitting: Cardiology

## 2017-04-25 ENCOUNTER — Other Ambulatory Visit (HOSPITAL_COMMUNITY): Payer: Self-pay | Admitting: Internal Medicine

## 2017-05-19 ENCOUNTER — Other Ambulatory Visit (HOSPITAL_COMMUNITY): Payer: Self-pay | Admitting: Cardiology

## 2017-05-20 ENCOUNTER — Other Ambulatory Visit (HOSPITAL_COMMUNITY): Payer: Self-pay | Admitting: Internal Medicine

## 2017-05-21 DIAGNOSIS — G4733 Obstructive sleep apnea (adult) (pediatric): Secondary | ICD-10-CM | POA: Diagnosis not present

## 2017-06-05 ENCOUNTER — Encounter (HOSPITAL_COMMUNITY): Payer: Self-pay | Admitting: Cardiology

## 2017-06-05 ENCOUNTER — Ambulatory Visit (HOSPITAL_COMMUNITY)
Admission: RE | Admit: 2017-06-05 | Discharge: 2017-06-05 | Disposition: A | Discharge status: Home / Self Care | Payer: Commercial Managed Care - PPO | Source: Ambulatory Visit | Attending: Cardiology | Admitting: Cardiology

## 2017-06-05 ENCOUNTER — Telehealth (HOSPITAL_COMMUNITY): Payer: Self-pay | Admitting: Cardiology

## 2017-06-05 VITALS — BP 126/78 | HR 97 | Wt 214.0 lb

## 2017-06-05 DIAGNOSIS — G4733 Obstructive sleep apnea (adult) (pediatric): Secondary | ICD-10-CM | POA: Diagnosis not present

## 2017-06-05 DIAGNOSIS — J45909 Unspecified asthma, uncomplicated: Secondary | ICD-10-CM | POA: Insufficient documentation

## 2017-06-05 DIAGNOSIS — I428 Other cardiomyopathies: Secondary | ICD-10-CM | POA: Diagnosis not present

## 2017-06-05 DIAGNOSIS — I5022 Chronic systolic (congestive) heart failure: Secondary | ICD-10-CM | POA: Insufficient documentation

## 2017-06-05 DIAGNOSIS — R5382 Chronic fatigue, unspecified: Secondary | ICD-10-CM

## 2017-06-05 DIAGNOSIS — Z79890 Hormone replacement therapy (postmenopausal): Secondary | ICD-10-CM | POA: Insufficient documentation

## 2017-06-05 DIAGNOSIS — R5383 Other fatigue: Secondary | ICD-10-CM | POA: Insufficient documentation

## 2017-06-05 DIAGNOSIS — F329 Major depressive disorder, single episode, unspecified: Secondary | ICD-10-CM | POA: Diagnosis not present

## 2017-06-05 DIAGNOSIS — E669 Obesity, unspecified: Secondary | ICD-10-CM | POA: Diagnosis not present

## 2017-06-05 DIAGNOSIS — Z7951 Long term (current) use of inhaled steroids: Secondary | ICD-10-CM | POA: Diagnosis not present

## 2017-06-05 DIAGNOSIS — Z6841 Body Mass Index (BMI) 40.0 and over, adult: Secondary | ICD-10-CM | POA: Diagnosis not present

## 2017-06-05 DIAGNOSIS — Z8249 Family history of ischemic heart disease and other diseases of the circulatory system: Secondary | ICD-10-CM | POA: Diagnosis not present

## 2017-06-05 DIAGNOSIS — I5042 Chronic combined systolic (congestive) and diastolic (congestive) heart failure: Secondary | ICD-10-CM

## 2017-06-05 DIAGNOSIS — E039 Hypothyroidism, unspecified: Secondary | ICD-10-CM | POA: Insufficient documentation

## 2017-06-05 DIAGNOSIS — Z79899 Other long term (current) drug therapy: Secondary | ICD-10-CM | POA: Insufficient documentation

## 2017-06-05 MED ORDER — VALSARTAN 80 MG PO TABS: 80.0000 mg | ORAL_TABLET | Freq: Two times a day (BID) | ORAL | 3 refills | Status: DC

## 2017-06-05 NOTE — Progress Notes (Signed)
Patient ID: Meredith Diaz, female   DOB: 02/12/1981, 37 y.o.   MRN: [id number]     Advanced Heart Failure Clinic Note   PCP: Dr. Okey Dupre HF Cardiology: Dr. Hart Robinsons Meredith Diaz is a 37 y.o. female with a hx of nonischemic cardiomyopathy.  This was diagnosed in 2013.  EF was initially 25-30%.  Cardiac cath at that time showed no significant CAD.  Cardiac MRI showed no late gadoliniuim enhancement.  Since that time, EF has remained low but has improved a bit.  Most recent echo in 11/17 showed EF 45-50% with diffuse hypokinesis.  She also has asthma.  PFTs showed a severe restrictive defect that may be due to body habitus primarily.   She was admitted in 8/16 with chest pain.  CTA chest showed no PE, lung parenchyma looked normal.  She had ETT with poor exercise tolerance but no evidence for ischemia.  CPX was done in 12/16, showing moderate functional limitation due primarily to lung restriction.   She presents today for followup of CHF. She has had a hard year. Struggled with depression after grandmother's death.  Weight is up 17 lbs.  She is not getting much exercise, rarely goes to the gym.  With weight gain, she has become more short of breath.  She is dyspneic after walking 100 feet or walking up a flight of stairs.  She has generally poor energy.  No chest pain.  No lightheadedness.    Labs (9/16): K 3.2, creatinine 1.11, hgb 14.3 Labs (12/16): K 3.3, creatinine 0.96, BNP 23 Labs (2/17): K 4.7, creatinine 0.95, TSH normal Labs (8/17): K 3.9, creatinine 0.93 Labs (12/18): K 3.6, creatinine 0.82, pro-BNP 42, TSH normal  ECG (personally reviewed): NSR, nonspecific anterior TWF  PMH: 1. Chronic systolic CHF: Nonischemic cardiomyopathy.  - EF 25-30% by echo in 2013.  - Cardiac MRI (2013) with EF 52%, no delayed enhancement.  - LHC in 2013 without significant CAD. - 11/15 echo with EF 40-45%.   - 7/16 echo with EF 40-45%, grade II diastolic dysfunction.  - ETT (8/29) with poor exercise  tolerance but no ischemia.  - CPX (12/16): peak VO2 15.7 (23.5 adjusted for ideal body weight), VE/VCO2 slope 21, RER 1.15 => moderate functional limitation thought to be primarily due to restrictive lung physiology.  - Echo (11/17): EF 45-50%, mild diffuse hypokinesis, normal RV size and systolic function.  2. Asthma: PFTs (4/16) with mild obstruction, severe restriction and severely decreased DLCO => think primarily due to body habitus.  3. Depression 4. Hypothyroidism 5. 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  6. Angioedema with ACEI 7. OSA: CPAP.  8. Chronic leukocytosis: Negative workup.  9. BPPV 10. Obesity  SH: Lives in [place], no smoking, rare ETOH, works as Programmer, multimedia.   FH: Father with CAD, grandfather and grandmother with MIs.  No cardiomyopathy.   ROS: All systems reviewed and negative except as per HPI.   Current Outpatient Medications  Medication Sig Dispense Refill  . albuterol (VENTOLIN HFA) 108 (90 Base) MCG/ACT inhaler Inhale 1-2 puffs into the lungs every 6 (six) hours as needed for wheezing or shortness of breath. 18 Inhaler 3  . budesonide-formoterol (SYMBICORT) 160-4.5 MCG/ACT inhaler Inhale 2 puffs into the lungs 2 times daily at 12 noon and 4 pm. 1 Inhaler 0  . budesonide-formoterol (SYMBICORT) 160-4.5 MCG/ACT inhaler INHALE 2 PUFFS INTO THE LUNGS TWICE DAILY 10.2 Inhaler 9  . calcium carbonate (TUMS - DOSED IN MG ELEMENTAL CALCIUM) 500 MG chewable  tablet Chew 1 tablet by mouth daily as needed for indigestion or heartburn. Reported on 06/15/2015    . Cholecalciferol (VITAMIN D) 2000 units CAPS Take by mouth daily.    . clobetasol cream (TEMOVATE) 0.05 % Apply 1 application topically as needed (for Sclerosis).     . DULoxetine (CYMBALTA) 60 MG capsule TAKE 1 CAPSULE (60 MG TOTAL) BY MOUTH DAILY. 30 capsule 6  . ibuprofen (ADVIL,MOTRIN) 200 MG tablet Take 400 mg by mouth every 6 (six) hours as needed for moderate pain.    [name] levothyroxine (SYNTHROID,  LEVOTHROID) 125 MCG tablet Take 1 tablet (125 mcg total) by mouth daily. NEED OFFICE VISIT FOR FURTHER REFILLS 90 tablet 0  . LORazepam (ATIVAN) 1 MG tablet Take 0.5-1 tablets (0.5-1 mg total) by mouth 3 (three) times daily as needed for anxiety. 90 tablet 0  . metoprolol succinate (TOPROL-XL) 50 MG 24 hr tablet TAKE 1 TAB BY MOUTH DAILY WITH OR IMMEDIATELY FOLLOWING A MEAL NEEDS OFFICE VISIT FOR FURTHER REFILL 30 tablet 0  . potassium chloride SA (K-DUR,KLOR-CON) 20 MEQ tablet Take 2 tablets (40 mEq total) by mouth daily. 60 tablet 3  . spironolactone (ALDACTONE) 25 MG tablet TAKE 1 TABLET EVERY DAY 30 tablet 6  . torsemide (DEMADEX) 20 MG tablet TAKE 2 TABLETS (40 MG TOTAL) BY MOUTH DAILY. NEEDS OFFICE VISIT 60 tablet 1  . traZODone (DESYREL) 50 MG tablet Take 1/2-1 tablet by mouth in evening as needed for sleep 30 tablet 5  . valsartan (DIOVAN) 80 MG tablet Take 1 tablet (80 mg total) by mouth 2 (two) times daily. Needs office visit 60 tablet 3   No current facility-administered medications for this encounter.    BP 126/78 (BP Location: Right Arm, Patient Position: Sitting, Cuff Size: Normal)   Pulse 97   Wt 214 lb (97.1 kg)   SpO2 91%   BMI 41.79 kg/m    Wt Readings from Last 3 Encounters:  06/05/17 214 lb (97.1 kg)  04/08/17 214 lb (97.1 kg)  02/18/17 204 lb (92.5 kg)    General: NAD, obese Neck: No JVD, no thyromegaly or thyroid nodule.  Lungs: Clear to auscultation bilaterally with normal respiratory effort. CV: Nondisplaced PMI.  Heart regular S1/S2, no S3/S4, no murmur.  No peripheral edema.  No carotid bruit.  Normal pedal pulses.  Abdomen: Soft, nontender, no hepatosplenomegaly, no distention.  Skin: Intact without lesions or rashes.  Neurologic: Alert and oriented x 3.  Psych: Normal affect. Extremities: No clubbing or cyanosis.  HEENT: Normal.   Assessment/Plan: 1. Chronic systolic CHF: Nonischemic cardiomyopathy.  No CAD on prior cath, prior cMRI showed no late  gadolinium enhancement.  No family history of cardiomyopathy.  No ETOH/substance abuse.  No uncontrolled HTN.  Possible that she had viral myocarditis. CPX showed moderate functional limitation but this seems to be primarily due to lung restriction (probably from body habitus).  Last echo in 2017 with EF 45-50%, diffuse hypokinesis, normal RV.  NYHA class III symptoms.  She is not volume overloaded on exam.  I suspect that increased dyspnea may be due to abdominal weight gain with corresponding worsening of lung extrinsic restriction.   - I will arrange for a repeat echo.  - Continue Toprol XL 50 mg daily.  - Continue spironolactone 25 daily.   - Increase valsartan to 80 mg bid.  He has tolerated ARB but had angioedema with ACEI, so cannot use ACEI or ARNI.  BMET today and again in 10 days.   2.  Asthma: No active wheezing on exam.  3. Restrictive PFTs: Suspect this is primarily due to body habitus.   4. Obesity: Weight is up due to decreased exercise and not following diet.  - Needs to get into regular exercise regimen.  -  I will refer to the [facility] weight [facility] program with Dr. Dalbert Garnet.  5. Fatigue: Out of proportion to her HF. I suspect that obesity/deconditioning plays a large role here.   Followup 1 year if echo unchanged.    Meredith Diaz 06/05/2017

## 2017-06-05 NOTE — Patient Instructions (Addendum)
Increase Valsartan 80 mg (1 tab), twice a day  Your physician has requested that you have an echocardiogram. Echocardiography is a painless test that uses sound waves to create images of your heart. It provides your doctor with information about the size and shape of your heart and how well your heart's chambers and valves are working. This procedure takes approximately one hour. There are no restrictions for this procedure. (they will call you)   Your physician recommends that you return for lab work in: 2 weeks    You have been referred to Dr. Dalbert Garnet for weight mangement call [phone] for appointment   Your physician recommends that you schedule a follow-up appointment in: 1 year with Dr. Shirlee Latch  (we will call you)

## 2017-06-06 NOTE — Telephone Encounter (Signed)
User: Trina Ao A Date/time: 06/05/17 3:21 PM  Comment: Called pt and lmsg for her to CB to get sch for an echo.Edmonia Caprio  Context:  Outcome: Left Message  Phone number: [phone] Phone Type: Home Phone  Comm. type: Telephone Call type: Outgoing  Contact: [name] Relation to patient: Self

## 2017-06-11 ENCOUNTER — Other Ambulatory Visit: Payer: Self-pay

## 2017-06-11 ENCOUNTER — Ambulatory Visit (HOSPITAL_COMMUNITY): Payer: Commercial Managed Care - PPO | Attending: Cardiovascular Disease

## 2017-06-11 DIAGNOSIS — I5042 Chronic combined systolic (congestive) and diastolic (congestive) heart failure: Secondary | ICD-10-CM | POA: Insufficient documentation

## 2017-06-11 MED ORDER — PERFLUTREN LIPID MICROSPHERE
1.0000 mL | INTRAVENOUS | Status: AC | PRN
Start: 2017-06-11 — End: 2017-06-11
  Administered 2017-06-11: 2 mL via INTRAVENOUS

## 2017-06-19 ENCOUNTER — Other Ambulatory Visit (HOSPITAL_COMMUNITY): Payer: Self-pay | Admitting: Cardiology

## 2017-06-19 ENCOUNTER — Ambulatory Visit (HOSPITAL_COMMUNITY)
Admission: RE | Admit: 2017-06-19 | Discharge: 2017-06-19 | Disposition: A | Discharge status: Home / Self Care | Payer: Commercial Managed Care - PPO | Source: Ambulatory Visit | Attending: Cardiology | Admitting: Cardiology

## 2017-06-19 DIAGNOSIS — I5042 Chronic combined systolic (congestive) and diastolic (congestive) heart failure: Secondary | ICD-10-CM | POA: Insufficient documentation

## 2017-06-19 LAB — BASIC METABOLIC PANEL
Anion gap: 13 (ref 5–15)
BUN: 15 mg/dL (ref 6–20)
CHLORIDE: 100 mmol/L — AB (ref 101–111)
CO2: 26 mmol/L (ref 22–32)
CREATININE: 0.94 mg/dL (ref 0.44–1.00)
Calcium: 9.3 mg/dL (ref 8.9–10.3)
GFR calc Af Amer: >60 mL/min (ref >60)
GFR calc non Af Amer: >60 mL/min (ref >60)
Glucose, Bld: 107 mg/dL — ABNORMAL HIGH (ref 65–99)
Potassium: 4 mmol/L (ref 3.5–5.1)
SODIUM: 139 mmol/L (ref 135–145)

## 2017-06-21 DIAGNOSIS — G4733 Obstructive sleep apnea (adult) (pediatric): Secondary | ICD-10-CM | POA: Diagnosis not present

## 2017-06-29 ENCOUNTER — Other Ambulatory Visit (HOSPITAL_COMMUNITY): Payer: Self-pay | Admitting: Cardiology

## 2017-07-05 ENCOUNTER — Ambulatory Visit (INDEPENDENT_AMBULATORY_CARE_PROVIDER_SITE_OTHER): Payer: Commercial Managed Care - PPO | Admitting: Endocrinology

## 2017-07-05 ENCOUNTER — Encounter: Payer: Self-pay | Admitting: Endocrinology

## 2017-07-05 VITALS — BP 102/70 | HR 95 | Ht 60.0 in | Wt 217.4 lb

## 2017-07-05 DIAGNOSIS — E039 Hypothyroidism, unspecified: Secondary | ICD-10-CM | POA: Diagnosis not present

## 2017-07-05 DIAGNOSIS — R5382 Chronic fatigue, unspecified: Secondary | ICD-10-CM

## 2017-07-05 MED ORDER — DEXAMETHASONE 1 MG PO TABS: ORAL_TABLET | ORAL | 0 refills | Status: DC

## 2017-07-05 NOTE — Patient Instructions (Addendum)
You should do a "dexamethasone suppression test."  For this, you would take dexamethasone 1 mg at 10 pm (I have sent a prescription to your pharmacy), then come in for a "cortisol" blood test the next morning before 9 am.  You do not need to be fasting for this test.  We'll recheck the thyroid at the same blood draw. Even if these are good, you should have your thyroid checked at least 2-3 times per year, as it has been variable.     Levothyroxine tablets What is this medicine? LEVOTHYROXINE (lee voe thye ROX een) is a thyroid hormone. This medicine can improve symptoms of thyroid deficiency such as slow speech, lack of energy, weight gain, hair loss, dry skin, and feeling cold. It also helps to treat goiter (an enlarged thyroid gland). It is also used to treat some kinds of thyroid cancer along with surgery and other medicines. This medicine may be used for other purposes; ask your health care provider or pharmacist if you have questions. COMMON BRAND NAME(S): Estre, Levo-T, Levothroid, Levoxyl, Synthroid, Thyro-Tabs, Unithroid What should I tell my health care provider before I take this medicine? They need to know if you have any of these conditions: -angina -blood clotting problems -diabetes -dieting or on a weight loss program -fertility problems -heart disease -high levels of thyroid hormone -pituitary gland problem -previous heart attack -an unusual or allergic reaction to levothyroxine, thyroid hormones, other medicines, foods, dyes, or preservatives -pregnant or trying to get pregnant -breast-feeding How should I use this medicine? Take this medicine by mouth with plenty of water. It is best to take on an empty stomach, at least 30 minutes before or 2 hours after food. Follow the directions on the prescription label. Take at the same time each day. Do not take your medicine more often than directed. Contact your pediatrician regarding the use of this medicine in children. While  this drug may be prescribed for children and infants as young as a few days of age for selected conditions, precautions do apply. For infants, you may crush the tablet and place in a small amount of (5-10 ml or 1 to 2 teaspoonfuls) of water, breast milk, or non-soy based infant formula. Do not mix with soy-based infant formula. Give as directed. Overdosage: If you think you have taken too much of this medicine contact a poison control center or emergency room at once. NOTE: This medicine is only for you. Do not share this medicine with others. What if I miss a dose? If you miss a dose, take it as soon as you can. If it is almost time for your next dose, take only that dose. Do not take double or extra doses. What may interact with this medicine? -amiodarone -antacids -anti-thyroid medicines -calcium supplements -carbamazepine -cholestyramine -colestipol -digoxin -female hormones, including contraceptive or birth control pills -iron supplements -ketamine -liquid nutrition products like Ensure -medicines for colds and breathing difficulties -medicines for diabetes -medicines for mental depression -medicines or herbals used to decrease weight or appetite -phenobarbital or other barbiturate medications -phenytoin -prednisone or other corticosteroids -rifabutin -rifampin -soy isoflavones -sucralfate -theophylline -warfarin This list may not describe all possible interactions. Give your health care provider a list of all the medicines, herbs, non-prescription drugs, or dietary supplements you use. Also tell them if you smoke, drink alcohol, or use illegal drugs. Some items may interact with your medicine. What should I watch for while using this medicine? Be sure to take this medicine with plenty of fluids.  Some tablets may cause choking, gagging, or difficulty swallowing from the tablet getting stuck in your throat. Most of these problems disappear if the medicine is taken with the right  amount of water or other fluids. Do not switch brands of this medicine unless your health care professional agrees with the change. Ask questions if you are uncertain. You will need regular exams and occasional blood tests to check the response to treatment. If you are receiving this medicine for an underactive thyroid, it may be several weeks before you notice an improvement. Check with your doctor or health care professional if your symptoms do not improve. It may be necessary for you to take this medicine for the rest of your life. Do not stop using this medicine unless your doctor or health care professional advises you to. This medicine can affect blood sugar levels. If you have diabetes, check your blood sugar as directed. You may lose some of your hair when you first start treatment. With time, this usually corrects itself. If you are going to have surgery, tell your doctor or health care professional that you are taking this medicine. What side effects may I notice from receiving this medicine? Side effects that you should report to your doctor or health care professional as soon as possible: -allergic reactions like skin rash, itching or hives, swelling of the face, lips, or tongue -chest pain -excessive sweating or intolerance to heat -fast or irregular heartbeat -nervousness -skin rash or hives -swelling of ankles, feet, or legs -tremors Side effects that usually do not require medical attention (report to your doctor or health care professional if they continue or are bothersome): -changes in appetite -changes in menstrual periods -diarrhea -hair loss -headache -trouble sleeping -weight loss This list may not describe all possible side effects. Call your doctor for medical advice about side effects. You may report side effects to FDA at 1-800-FDA-1088. Where should I keep my medicine? Keep out of the reach of children. Store at room temperature between 15 and 30 degrees C (59  and 86 degrees F). Protect from light and moisture. Keep container tightly closed. Throw away any unused medicine after the expiration date. NOTE: This sheet is a summary. It may not cover all possible information. If you have questions about this medicine, talk to your doctor, pharmacist, or health care provider.  2018 [employer] Standard (2008-07-30 14:28:07)

## 2017-07-05 NOTE — Progress Notes (Signed)
Subjective:    Patient ID: Meredith Diaz, female    DOB: Jun 06, 1980, 37 y.o.   MRN: [id number]  HPI Pt is referred by Dr Okey Dupre, for hypothyroidism.  Pt reports hypothyroidism was dx'ed in 2006.  she has been on prescribed thyroid hormone therapy since then.  she has never taken kelp or any other type of non-prescribed thyroid product.  she has never had thyroid imaging.  She is not considering a pregnancy.  she has never had thyroid surgery, or XRT to the neck.  she has never been on amiodarone or lithium.  She has moderate hair loss on the head, and assoc fatigue.   Past Medical History:  Diagnosis Date  . Anxiety   . Asthma   . Chronic combined systolic and diastolic CHF (congestive heart failure) (HCC) 07/2011   a. Prior Echo: EF was 25-30%; b. No Sig CAD on CATH; c. No significant finding on Cardiac MRI; d. follow-up Echo 2014 --  EF of 50%;  e. 03/2014 Echo: EF 40-45%, gr 1 DD; f. 11/2014 Echo: EF 40-45%, Gr 2 DD.  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  . Depression   . Essential hypertension   . Family history of breast cancer   . Family history of colon cancer   . Morbid obesity (HCC)   . Nonischemic cardiomyopathy (HCC)    a. unclear etiology;  b. 01/2012 Cardiac MRI: no infiltrative pathology, EF 52%;  b. 11/2014 Echo: EF 40-45%, gr 2 DD.  [name] Stomach problems   . Thyroid disease     Past Surgical History:  Procedure Laterality Date  . CARDIAC CATHETERIZATION  08/2011   Normal Coronaries - LVEDP 32 mmHg  . Cardiac MRI  April 2013   No suggestion of a congenital abnormality; EF ~ 52% - no regional wall motion abnormalities., no increased myocardial signal intensity. No perfusion abnormality.  . CHOLECYSTECTOMY  2013  . TRANSTHORACIC ECHOCARDIOGRAM  07/2011   Moderate concentric LVH, mildly dilated. EF 25-30% no  diastolic dysfunction parameters to suggest elevated LAP  . 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 Normal wall thickness (compared to prior reading of possible apical LVH), grade 2 DD -- with elevated LV filling pressures (however left atrium was read as normal size), normal artery function and size. No no suggestion of pulmonary hypertension    Social History   Socioeconomic History  . Marital status: Married    Spouse name: Not on file  . Number of children: Not on file  . Years of education: Not on file  . Highest education level: Not on file  Social Needs  . Financial resource strain: Not on file  . Food insecurity - worry: Not on file  . Food insecurity - inability: Not on file  . Transportation needs - medical: Not on file  . Transportation needs - non-medical: Not on file  Occupational History  . Occupation: Programmer, multimedia  Tobacco Use  . Smoking status: Never Smoker  . Smokeless tobacco: Never Used  Substance and Sexual Activity  . Alcohol use: No  . Drug use: No  . Sexual activity: Not on file  Other Topics Concern  . Not on file  Social History Narrative  .  Not on file    Current Outpatient Medications on File Prior to Visit  Medication Sig Dispense Refill  . albuterol (VENTOLIN HFA) 108 (90 Base) MCG/ACT inhaler Inhale 1-2 puffs into the lungs every 6 (six) hours as needed for wheezing or shortness of breath. 18 Inhaler 3  . budesonide-formoterol (SYMBICORT) 160-4.5 MCG/ACT inhaler Inhale 2 puffs into the lungs 2 times daily at 12 noon and 4 pm. 1 Inhaler 0  . budesonide-formoterol (SYMBICORT) 160-4.5 MCG/ACT inhaler INHALE 2 PUFFS INTO THE LUNGS TWICE DAILY 10.2 Inhaler 9  . clobetasol cream (TEMOVATE) 0.05 % Apply 1 application topically as needed (for Sclerosis).     . DULoxetine (CYMBALTA) 60 MG capsule TAKE 1 CAPSULE (60 MG  TOTAL) BY MOUTH DAILY. 30 capsule 6  . levothyroxine (SYNTHROID, LEVOTHROID) 125 MCG tablet Take 1 tablet (125 mcg total) by mouth daily. NEED OFFICE VISIT FOR FURTHER REFILLS 90 tablet 0  . LORazepam (ATIVAN) 1 MG tablet Take 0.5-1 tablets (0.5-1 mg total) by mouth 3 (three) times daily as needed for anxiety. 90 tablet 0  . metoprolol succinate (TOPROL-XL) 50 MG 24 hr tablet TAKE 1 TAB BY MOUTH DAILY WITH OR IMMEDIATELY FOLLOWING A MEAL NEEDS OFFICE VISIT FOR FURTHER REFILL 30 tablet 11  . potassium chloride SA (K-DUR,KLOR-CON) 20 MEQ tablet Take 2 tablets (40 mEq total) by mouth daily. 60 tablet 3  . spironolactone (ALDACTONE) 25 MG tablet TAKE 1 TABLET EVERY DAY 30 tablet 6  . torsemide (DEMADEX) 20 MG tablet Take 2 tablets (40 mg total) by mouth daily. 60 tablet 11  . traZODone (DESYREL) 50 MG tablet Take 1/2-1 tablet by mouth in evening as needed for sleep 30 tablet 5  . valsartan (DIOVAN) 80 MG tablet Take 1 tablet (80 mg total) by mouth 2 (two) times daily. Needs office visit 60 tablet 3   No current facility-administered medications on file prior to visit.     Allergies  Allergen Reactions  . Lisinopril Swelling    Angioedema facial, mostly just lips, no trouble breathing    Family History  Problem Relation Age of Onset  . Depression Mother   . Hypertension Father   . Colon cancer Maternal Grandmother 45  . Heart attack Maternal Grandmother   . Heart disease Maternal Grandfather   . Heart attack Maternal Grandfather   . Heart disease Paternal Grandmother   . Breast cancer Paternal Grandmother 75       possible bilateral cancer  . Asthma Paternal Grandmother   . Heart attack Paternal Grandmother   . Heart attack Paternal Grandfather   . Breast cancer Maternal Aunt 50  . Breast cancer Paternal Aunt 43  . Stroke Neg Hx   . Thyroid disease Neg Hx     BP 102/70 (BP Location: Left Arm, Patient Position: Sitting, Cuff Size: Normal)   Pulse 95   Ht 5' (1.524 m)   Wt 217 lb  6.4 oz (98.6 kg)   SpO2 93%   BMI 42.46 kg/m    Review of Systems denies memory loss, constipation, numbness, blurry vision, and syncope.  She has diffuse myalgias, leg cramps, depression, dry skin, cold intolerance, rhinorrhea, difficulty with concentration, easy bruising, and hoarseness.   She has chronic weight gain and sob.      Objective:   Physical Exam VS: see vs page GEN: no distress.  Morbid obesity.  HEAD: head: no deformity eyes: no periorbital swelling, no proptosis external nose and ears are normal.  mouth: no lesion seen NECK: supple,  thyroid is not enlarged CHEST WALL: no deformity LUNGS: clear to auscultation CV: reg rate and rhythm, no murmur ABD: abdomen is soft, nontender.  no hepatosplenomegaly.  not distended.  no hernia MUSCULOSKELETAL: muscle bulk and strength are grossly normal.  no obvious joint swelling.  gait is normal and steady EXTEMITIES: no deformity.  no ulcer on the feet.  feet are of normal color and temp.  no edema PULSES: dorsalis pedis intact bilat.  no carotid bruit NEURO:  cn 2-12 grossly intact.   readily moves all 4's.  sensation is intact to touch on the feet SKIN:  Normal texture and temperature.  No rash or suspicious lesion is visible.   NODES:  None palpable at the neck PSYCH: alert, well-oriented.  Does not appear anxious nor depressed.    Lab Results  Component Value Date   TSH 3.41 04/10/2017   T4TOTAL 10.5 04/08/2017   Chest CT (2018): no mention is made of the thyroid  I have reviewed outside records, and summarized: Pt was noted to have hypothyroidism, and referred here.  She reported multiple symptoms, but TFT were normal.     Assessment & Plan:  Chronic primary hypothyroidism: well-replaced Weight gain, chronic but new to me  Patient Instructions  You should do a "dexamethasone suppression test."  For this, you would take dexamethasone 1 mg at 10 pm (I have sent a prescription to your pharmacy), then come in for a  "cortisol" blood test the next morning before 9 am.  You do not need to be fasting for this test.  We'll recheck the thyroid at the same blood draw. Even if these are good, you should have your thyroid checked at least 2-3 times per year, as it has been variable.     Levothyroxine tablets What is this medicine? LEVOTHYROXINE (lee voe thye ROX een) is a thyroid hormone. This medicine can improve symptoms of thyroid deficiency such as slow speech, lack of energy, weight gain, hair loss, dry skin, and feeling cold. It also helps to treat goiter (an enlarged thyroid gland). It is also used to treat some kinds of thyroid cancer along with surgery and other medicines. This medicine may be used for other purposes; ask your health care provider or pharmacist if you have questions. COMMON BRAND NAME(S): Estre, Levo-T, Levothroid, Levoxyl, Synthroid, Thyro-Tabs, Unithroid What should I tell my health care provider before I take this medicine? They need to know if you have any of these conditions: -angina -blood clotting problems -diabetes -dieting or on a weight loss program -fertility problems -heart disease -high levels of thyroid hormone -pituitary gland problem -previous heart attack -an unusual or allergic reaction to levothyroxine, thyroid hormones, other medicines, foods, dyes, or preservatives -pregnant or trying to get pregnant -breast-feeding How should I use this medicine? Take this medicine by mouth with plenty of water. It is best to take on an empty stomach, at least 30 minutes before or 2 hours after food. Follow the directions on the prescription label. Take at the same time each day. Do not take your medicine more often than directed. Contact your pediatrician regarding the use of this medicine in children. While this drug may be prescribed for children and infants as young as a few days of age for selected conditions, precautions do apply. For infants, you may crush the tablet and  place in a small amount of (5-10 ml or 1 to 2 teaspoonfuls) of water, breast milk, or non-soy based infant formula. Do not mix with  soy-based infant formula. Give as directed. Overdosage: If you think you have taken too much of this medicine contact a poison control center or emergency room at once. NOTE: This medicine is only for you. Do not share this medicine with others. What if I miss a dose? If you miss a dose, take it as soon as you can. If it is almost time for your next dose, take only that dose. Do not take double or extra doses. What may interact with this medicine? -amiodarone -antacids -anti-thyroid medicines -calcium supplements -carbamazepine -cholestyramine -colestipol -digoxin -female hormones, including contraceptive or birth control pills -iron supplements -ketamine -liquid nutrition products like Ensure -medicines for colds and breathing difficulties -medicines for diabetes -medicines for mental depression -medicines or herbals used to decrease weight or appetite -phenobarbital or other barbiturate medications -phenytoin -prednisone or other corticosteroids -rifabutin -rifampin -soy isoflavones -sucralfate -theophylline -warfarin This list may not describe all possible interactions. Give your health care provider a list of all the medicines, herbs, non-prescription drugs, or dietary supplements you use. Also tell them if you smoke, drink alcohol, or use illegal drugs. Some items may interact with your medicine. What should I watch for while using this medicine? Be sure to take this medicine with plenty of fluids. Some tablets may cause choking, gagging, or difficulty swallowing from the tablet getting stuck in your throat. Most of these problems disappear if the medicine is taken with the right amount of water or other fluids. Do not switch brands of this medicine unless your health care professional agrees with the change. Ask questions if you are  uncertain. You will need regular exams and occasional blood tests to check the response to treatment. If you are receiving this medicine for an underactive thyroid, it may be several weeks before you notice an improvement. Check with your doctor or health care professional if your symptoms do not improve. It may be necessary for you to take this medicine for the rest of your life. Do not stop using this medicine unless your doctor or health care professional advises you to. This medicine can affect blood sugar levels. If you have diabetes, check your blood sugar as directed. You may lose some of your hair when you first start treatment. With time, this usually corrects itself. If you are going to have surgery, tell your doctor or health care professional that you are taking this medicine. What side effects may I notice from receiving this medicine? Side effects that you should report to your doctor or health care professional as soon as possible: -allergic reactions like skin rash, itching or hives, swelling of the face, lips, or tongue -chest pain -excessive sweating or intolerance to heat -fast or irregular heartbeat -nervousness -skin rash or hives -swelling of ankles, feet, or legs -tremors Side effects that usually do not require medical attention (report to your doctor or health care professional if they continue or are bothersome): -changes in appetite -changes in menstrual periods -diarrhea -hair loss -headache -trouble sleeping -weight loss This list may not describe all possible side effects. Call your doctor for medical advice about side effects. You may report side effects to FDA at 1-800-FDA-1088. Where should I keep my medicine? Keep out of the reach of children. Store at room temperature between 15 and 30 degrees C (59 and 86 degrees F). Protect from light and moisture. Keep container tightly closed. Throw away any unused medicine after the expiration date. NOTE: This sheet  is a summary. It may not cover all  possible information. If you have questions about this medicine, talk to your doctor, pharmacist, or health care provider.  2018 [employer] Standard (2008-07-30 14:28:07)

## 2017-07-10 ENCOUNTER — Encounter: Payer: Self-pay | Admitting: Endocrinology

## 2017-07-10 ENCOUNTER — Other Ambulatory Visit (INDEPENDENT_AMBULATORY_CARE_PROVIDER_SITE_OTHER): Payer: Commercial Managed Care - PPO

## 2017-07-10 DIAGNOSIS — R5382 Chronic fatigue, unspecified: Secondary | ICD-10-CM

## 2017-07-10 DIAGNOSIS — E039 Hypothyroidism, unspecified: Secondary | ICD-10-CM

## 2017-07-10 LAB — T4, FREE: Free T4: 0.93 ng/dL (ref 0.60–1.60)

## 2017-07-10 LAB — TSH: TSH: 1.24 u[IU]/mL (ref 0.35–4.50)

## 2017-07-10 LAB — T3, FREE: T3 FREE: 3.1 pg/mL (ref 2.3–4.2)

## 2017-07-10 LAB — CORTISOL: Cortisol, Plasma: 0.9 ug/dL

## 2017-07-11 ENCOUNTER — Encounter: Payer: Self-pay | Admitting: Endocrinology

## 2017-07-19 DIAGNOSIS — G4733 Obstructive sleep apnea (adult) (pediatric): Secondary | ICD-10-CM | POA: Diagnosis not present

## 2017-07-25 ENCOUNTER — Encounter (INDEPENDENT_AMBULATORY_CARE_PROVIDER_SITE_OTHER): Payer: Self-pay

## 2017-07-29 ENCOUNTER — Other Ambulatory Visit (HOSPITAL_COMMUNITY): Payer: Self-pay | Admitting: Cardiology

## 2017-08-07 ENCOUNTER — Encounter (INDEPENDENT_AMBULATORY_CARE_PROVIDER_SITE_OTHER): Payer: Self-pay | Admitting: Family Medicine

## 2017-08-07 ENCOUNTER — Ambulatory Visit (INDEPENDENT_AMBULATORY_CARE_PROVIDER_SITE_OTHER): Payer: Commercial Managed Care - PPO | Admitting: Family Medicine

## 2017-08-07 VITALS — BP 104/71 | HR 83 | Ht 59.0 in | Wt 211.0 lb

## 2017-08-07 DIAGNOSIS — Z9189 Other specified personal risk factors, not elsewhere classified: Secondary | ICD-10-CM

## 2017-08-07 DIAGNOSIS — E038 Other specified hypothyroidism: Secondary | ICD-10-CM

## 2017-08-07 DIAGNOSIS — Z0289 Encounter for other administrative examinations: Secondary | ICD-10-CM

## 2017-08-07 DIAGNOSIS — Z6841 Body Mass Index (BMI) 40.0 and over, adult: Secondary | ICD-10-CM

## 2017-08-07 DIAGNOSIS — R0609 Other forms of dyspnea: Secondary | ICD-10-CM | POA: Diagnosis not present

## 2017-08-07 DIAGNOSIS — Z1331 Encounter for screening for depression: Secondary | ICD-10-CM

## 2017-08-07 DIAGNOSIS — R5383 Other fatigue: Secondary | ICD-10-CM | POA: Diagnosis not present

## 2017-08-07 NOTE — Progress Notes (Signed)
Office: [phone]  /  Fax: [phone]   Dear Dr. Shirlee Latch,   Thank you for referring Meredith Diaz to our clinic. The following note includes my evaluation and treatment recommendations.  HPI:   Chief Complaint: OBESITY    Meredith Diaz has been referred by Laurey Morale, MD for consultation regarding obesity and obesity related comorbidities.    Meredith Diaz (MR# [id number]) is a 37 y.o. female who presents on 08/07/2017 for obesity evaluation and treatment. Current BMI is Body mass index is 42.62 kg/m.[name] Meredith Diaz has been struggling with her weight for many years and has been unsuccessful in either losing weight, maintaining weight loss, or reaching her healthy weight goal.     Meredith Diaz attended our information session and states she is currently in the action stage of change and ready to dedicate time achieving and maintaining a healthier weight. Meredith Diaz is interested in becoming our patient and working on intensive lifestyle modifications including (but not limited to) diet, exercise and weight loss.    Meredith Diaz states her family eats meals together she thinks her family will eat healthier with  her her desired weight loss is 94 to 104 she has been heavy most of  her life she started gaining weight in her teens her heaviest weight ever was 214 lbs. she has significant food cravings issues  she skips meals frequently she is frequently drinking liquids with calories she frequently makes poor food choices she has problems with excessive hunger  she frequently eats larger portions than normal  she has binge eating behaviors she struggles with emotional eating    Fatigue Meredith Diaz feels her energy is lower than it should be. This has worsened with weight gain and has not worsened recently. Meredith Diaz admits to daytime somnolence and  admits to waking up still tired. Patient has a diagnosis of obstructive sleep apnea, which may be contributing to her fatigue.[name] Meredith Diaz has a history of symptoms of  daytime fatigue and morning fatigue. Patient generally gets 5 to 7 hours of sleep per night, and states they generally have restless sleep. Snoring is present. Apneic episodes is present without CPAP use. Epworth Sleepiness Score is 12  EKG was ordered today and shows RVR (just saw cardiology in (February).   Meredith Diaz notes increasing shortness of breath with exercising and seems to be worsening over time with weight gain. She notes getting out of breath sooner with activity than she used to. This has not gotten worse recently. EKG was ordered today and shows RVR (just saw cardiology in (February). Meredith Diaz denies orthopnea.  Hypothyroidism Meredith Diaz has a diagnosis of hypothyroidism and was just seen by endocrinology in March and had thyroid panel at that time. She is on levothyroxine 125 mcg daily. She denies hot or cold intolerance or palpitations, but does admit to ongoing fatigue.  At risk for cardiovascular disease Meredith Diaz is at a higher than average risk for cardiovascular disease due to obesity. She currently denies any chest pain.  Depression Screen Meredith Diaz's Food and Mood (modified PHQ-9) score was  Depression screen PHQ 2/9 08/07/2017  Decreased Interest 2  Down, Depressed, Hopeless 3  PHQ - 2 Score 5  Altered sleeping 2  Tired, decreased energy 3  Change in appetite 3  Feeling bad or failure about yourself  3  Trouble concentrating 2  Moving slowly or fidgety/restless 2  Suicidal thoughts 0  PHQ-9 Score 20  Difficult doing work/chores Very difficult  Some recent data might be hidden  ALLERGIES: Allergies  Allergen Reactions  . Lisinopril Swelling    Angioedema facial, mostly just lips, no trouble breathing    MEDICATIONS: Current Outpatient Medications on File Prior to Visit  Medication Sig Dispense Refill  . albuterol (VENTOLIN HFA) 108 (90 Base) MCG/ACT inhaler Inhale 1-2 puffs into the lungs every 6 (six) hours as needed for wheezing or shortness of  breath. 18 Inhaler 3  . budesonide-formoterol (SYMBICORT) 160-4.5 MCG/ACT inhaler INHALE 2 PUFFS INTO THE LUNGS TWICE DAILY 10.2 Inhaler 9  . DULoxetine (CYMBALTA) 60 MG capsule TAKE 1 CAPSULE (60 MG TOTAL) BY MOUTH DAILY. 30 capsule 6  . levothyroxine (SYNTHROID, LEVOTHROID) 125 MCG tablet Take 1 tablet (125 mcg total) by mouth daily. NEED OFFICE VISIT FOR FURTHER REFILLS 90 tablet 0  . metoprolol succinate (TOPROL-XL) 50 MG 24 hr tablet TAKE 1 TAB BY MOUTH DAILY WITH OR IMMEDIATELY FOLLOWING A MEAL NEEDS OFFICE VISIT FOR FURTHER REFILL 30 tablet 11  . spironolactone (ALDACTONE) 25 MG tablet TAKE 1 TABLET EVERY DAY 30 tablet 6  . torsemide (DEMADEX) 20 MG tablet Take 2 tablets (40 mg total) by mouth daily. 60 tablet 11  . valsartan (DIOVAN) 80 MG tablet Take 1 tablet (80 mg total) by mouth 2 (two) times daily. 60 tablet 11   No current facility-administered medications on file prior to visit.     PAST MEDICAL HISTORY: Past Medical History:  Diagnosis Date  . Anxiety   . Asthma   . Back pain   . Chronic combined systolic and diastolic CHF (congestive heart failure) (HCC) 07/2011   a. Prior Echo: EF was 25-30%; b. No Sig CAD on CATH; c. No significant finding on Cardiac MRI; d. follow-up Echo 2014 --  EF of 50%;  e. 03/2014 Echo: EF 40-45%, gr 1 DD; f. 11/2014 Echo: EF 40-45%, Gr 2 DD.  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  . Depression   . Meredith   . Essential hypertension   . Family history of breast cancer   . Family history of colon cancer   . Fibromyalgia   . Gallbladder problem   . Hypothyroidism   . IBS (irritable bowel syndrome)   . Morbid obesity (HCC)   . Nonischemic cardiomyopathy (HCC)    a. unclear etiology;  b. 01/2012 Cardiac MRI: no infiltrative pathology, EF 52%;  b. 11/2014 Echo: EF 40-45%, gr 2 DD.  [name]  Scoliosis   . Sleep apnea   . Stomach problems   . Thyroid disease   . Vitamin B 12 deficiency   . Vitamin D deficiency     PAST SURGICAL HISTORY: Past Surgical History:  Procedure Laterality Date  . CARDIAC CATHETERIZATION  08/2011   Normal Coronaries - LVEDP 32 mmHg  . Cardiac MRI  April 2013   No suggestion of a congenital abnormality; EF ~ 52% - no regional wall motion abnormalities., no increased myocardial signal intensity. No perfusion abnormality.  . CHOLECYSTECTOMY  2013  . TRANSTHORACIC ECHOCARDIOGRAM  07/2011   Moderate concentric LVH, mildly dilated. EF 25-30% no diastolic dysfunction parameters to suggest elevated LAP  . 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 Normal wall thickness (compared to prior reading of possible apical LVH), grade  2 DD -- with elevated LV filling pressures (however left atrium was read as normal size), normal artery function and size. No no suggestion of pulmonary hypertension    SOCIAL HISTORY: Social History   Tobacco Use  . Smoking status: Never Smoker  . Smokeless tobacco: Never Used  Substance Use Topics  . Alcohol use: No  . Drug use: No    FAMILY HISTORY: Family History  Problem Relation Age of Onset  . Depression Mother   . Alcoholism Mother   . Hypertension Father   . Heart disease Father   . Colon cancer Maternal Grandmother 58  . Heart attack Maternal Grandmother   . Heart disease Maternal Grandfather   . Heart attack Maternal Grandfather   . Heart disease Paternal Grandmother   . Breast cancer Paternal Grandmother 21       possible bilateral cancer  . Asthma Paternal Grandmother   . Heart attack Paternal Grandmother   . Heart attack Paternal Grandfather   . Breast cancer Maternal Aunt 50  . Breast cancer Paternal Aunt 54  . Stroke Neg Hx   . Thyroid disease Neg Hx      ROS: Review of Systems  Constitutional: Positive for malaise/fatigue.  Eyes:       Wear Glasses or Contacts  Respiratory: Positive for shortness of breath.   Cardiovascular: Negative for palpitations and orthopnea.       Shortness of Breath with activity Chest Pain/Discomfort Chest Tightness Difficulty Breathing while Lying Down Calf/Leg Pain with Walking   Gastrointestinal: Positive for constipation and diarrhea.  Musculoskeletal: Positive for back pain.       Muscle or Joint Pain Muscle Stiffness  Skin:       Dryness   Neurological: Positive for weakness.  Endo/Heme/Allergies: Bruises/bleeds easily (bruising).       Negative for hot or cold intolerance  Psychiatric/Behavioral: Positive for depression. The patient is nervous/anxious (nervousness) and has insomnia.        Stress    PHYSICAL EXAM: Blood pressure 104/71, pulse 83, height 4\' 11"  (1.499 m), weight 211 lb (95.7 kg), SpO2 95 %. Body mass index is 42.62 kg/m. Physical Exam  Constitutional: She is oriented to person, place, and time. She appears well-developed and well-nourished.  HENT:  Head: Normocephalic and atraumatic.  Nose: Nose normal.  Eyes: EOM are normal. No scleral icterus.  Neck: Normal range of motion. Neck supple. Thyromegaly present.  Cardiovascular: Normal rate and regular rhythm.  Pulmonary/Chest: Effort normal. No respiratory distress.  Abdominal: Soft. There is no tenderness.  + obesity  Musculoskeletal: Normal range of motion.  Range of Motion normal in all 4 extremities  Neurological: She is alert and oriented to person, place, and time. Coordination normal.  Skin: Skin is warm and dry.  Psychiatric: She has a normal mood and affect. Her behavior is normal.  Vitals reviewed.   RECENT LABS AND TESTS: BMET    Component Value Date/Time   NA 139 06/19/2017 0910   NA 138 07/01/2015 1420   K 4.0 06/19/2017 0910   K 3.4 (L) 07/01/2015 1420   CL 100 (L) 06/19/2017 0910   CO2 26  06/19/2017 0910   CO2 29 07/01/2015 1420   GLUCOSE 107 (H) 06/19/2017 0910   GLUCOSE 101 07/01/2015 1420   BUN 15 06/19/2017 0910   BUN 22.2 07/01/2015 1420   CREATININE 0.94 06/19/2017 0910   CREATININE 1.2 (H) 07/01/2015 1420   CALCIUM 9.3 06/19/2017 0910   CALCIUM 9.1 07/01/2015 1420  GFRNONAA >60 06/19/2017 0910   GFRAA >60 06/19/2017 0910   Lab Results  Component Value Date   HGBA1C 5.6 06/15/2015   No results found for: INSULIN CBC    Component Value Date/Time   WBC 16.6 (H) 04/08/2017 1350   RBC 4.68 04/08/2017 1350   HGB 14.2 04/08/2017 1350   HGB 13.3 07/12/2015 1307   HCT 42.7 04/08/2017 1350   HCT 40.3 07/12/2015 1307   PLT 389.0 04/08/2017 1350   PLT 460 (H) 07/12/2015 1307   MCV 91.2 04/08/2017 1350   MCV 88.7 07/12/2015 1307   MCH 29.9 03/16/2016 1451   MCHC 33.4 04/08/2017 1350   RDW 13.8 04/08/2017 1350   RDW 13.0 07/12/2015 1307   LYMPHSABS 3.7 10/02/2016 1546   LYMPHSABS 4.1 (H) 07/12/2015 1307   MONOABS 1.1 (H) 10/02/2016 1546   MONOABS 1.4 (H) 07/12/2015 1307   EOSABS 0.2 10/02/2016 1546   EOSABS 0.3 07/12/2015 1307   BASOSABS 0.1 10/02/2016 1546   BASOSABS 0.1 07/12/2015 1307   Iron/TIBC/Ferritin/ %Sat No results found for: IRON, TIBC, FERRITIN, IRONPCTSAT Lipid Panel     Component Value Date/Time   CHOL 176 01/28/2014 1123   TRIG 96.0 01/28/2014 1123   HDL 49.80 01/28/2014 1123   CHOLHDL 4 01/28/2014 1123   VLDL 19.2 01/28/2014 1123   LDLCALC 107 (H) 01/28/2014 1123   Hepatic Function Panel     Component Value Date/Time   PROT 8.4 (H) 04/08/2017 1350   PROT 8.2 07/01/2015 1420   ALBUMIN 4.4 04/08/2017 1350   ALBUMIN 3.5 07/01/2015 1420   AST 15 04/08/2017 1350   AST 24 07/01/2015 1420   ALT 19 04/08/2017 1350   ALT 35 07/01/2015 1420   ALKPHOS 107 04/08/2017 1350   ALKPHOS 109 07/01/2015 1420   BILITOT 0.4 04/08/2017 1350   BILITOT 0.38 07/01/2015 1420   BILIDIR <0.1 (L) 01/05/2015 2226   IBILI NOT CALCULATED 01/05/2015  2226      Component Value Date/Time   TSH 1.24 07/10/2017 0814   TSH 3.41 04/10/2017 1358   TSH 18.07 (H) 10/08/2016 0904    ECG  shows NSR with a rate of 87 BPM INDIRECT CALORIMETER done today shows a VO2 of 293 and a REE of 2041.  Her calculated basal metabolic rate is 1610 thus her basal metabolic rate is better than expected.    ASSESSMENT AND PLAN: Other fatigue - Plan: EKG 12-Lead, Comprehensive metabolic panel, CBC With Differential, Hemoglobin A1c, Insulin, random, Lipid Panel With LDL/HDL Ratio, VITAMIN D 25 Hydroxy (Vit-D Deficiency, Fractures), Vitamin B12, Folate  Meredith on exertion  Other specified hypothyroidism  Depression screening  At risk for heart disease  Class 3 severe obesity without serious comorbidity with body mass index (BMI) of 40.0 to 44.9 in adult, unspecified obesity type (HCC)  PLAN: Fatigue Orel was informed that her fatigue may be related to obesity, depression or many other causes. Labs will be ordered, and in the meanwhile Nykia has agreed to work on diet, exercise and weight loss to help with fatigue. Proper sleep hygiene was discussed including the need for 7-8 hours of quality sleep each night. A sleep study was not ordered based on symptoms and Epworth score. We will order indirect calorimetry.   Meredith on exertion Meredith Diaz's shortness of breath appears to be obesity related and exercise induced. She has agreed to work on weight loss and gradually increase exercise to treat her exercise induced shortness of breath. If Meredith Diaz follows our instructions and loses weight  without improvement of her shortness of breath, we will plan to refer to pulmonology. We will order labs and indirect calorimetry. We will monitor this condition regularly. Meredith Diaz agrees to this plan.  Hypothyroidism Meredith Diaz was informed of the importance of good thyroid control to help with weight loss efforts. She was also informed that supertheraputic thyroid levels are dangerous  and will not improve weight loss results. We will get endocrine records and will retest thyroid panel in 4 months.  Cardiovascular risk counseling Meredith Diaz was given extended (15 minutes) coronary artery disease prevention counseling today. She is 37 y.o. female and has risk factors for heart disease including obesity. We discussed intensive lifestyle modifications today with an emphasis on specific weight loss instructions and strategies. Pt was also informed of the importance of increasing exercise and decreasing saturated fats to help prevent heart disease.  Depression Screen Meredith Diaz had a strongly positive depression screening. Depression is commonly associated with obesity and often results in emotional eating behaviors. We will monitor this closely and work on CBT to help improve the non-hunger eating patterns. Referral to Psychology may be required if no improvement is seen as she continues in our clinic.  Obesity Meredith Diaz is currently in the action stage of change and her goal is to continue with weight loss efforts. I recommend Meredith Diaz begin the structured treatment plan as follows:  She has agreed to follow the Category 3 plan Meredith Diaz has been instructed to eventually work up to a goal of 150 minutes of combined cardio and strengthening exercise per week for weight loss and overall health benefits. We discussed the following Behavioral Modification Strategies today: planning for success, better snacking choices, increasing lean protein intake, increasing vegetables and work on meal planning and easy cooking plans   She was informed of the importance of frequent follow up visits to maximize her success with intensive lifestyle modifications for her multiple health conditions. She was informed we would discuss her lab results at her next visit unless there is a critical issue that needs to be addressed sooner. Itha agreed to keep her next visit at the agreed upon time to discuss these  results.    OBESITY BEHAVIORAL INTERVENTION VISIT  Today's visit was # 1 out of 22.  Starting weight: 211 lbs Starting date: 08/07/17 Today's weight : 211 lbs Today's date: 08/07/2017 Total lbs lost to date: 0 (Patients must lose 7 lbs in the first 6 months to continue with counseling)   ASK: We discussed the diagnosis of obesity with Meredith Diaz today and Meredith Diaz agreed to give [place] permission to discuss obesity behavioral modification therapy today.  ASSESS: Taeler has the diagnosis of obesity and her BMI today is 42.59 Trinitie is in the action stage of change   ADVISE: Samuel was educated on the multiple health risks of obesity as well as the benefit of weight loss to improve her health. She was advised of the need for long term treatment and the importance of lifestyle modifications.  AGREE: Multiple dietary modification options and treatment options were discussed and  Layann agreed to the above obesity treatment plan.   I, [place] Crane, am acting as transcriptionist for  Filbert Schilder, MD   I have reviewed the above documentation for accuracy and completeness, and I agree with the above. - Debbra Riding, MD

## 2017-08-08 LAB — CBC WITH DIFFERENTIAL
BASOS: 0 %
Basophils Absolute: 0.1 10*3/uL (ref 0.0–0.2)
EOS (ABSOLUTE): 0.3 10*3/uL (ref 0.0–0.4)
EOS: 2 %
HEMATOCRIT: 44.4 % (ref 34.0–46.6)
HEMOGLOBIN: 15.6 g/dL (ref 11.1–15.9)
IMMATURE GRANS (ABS): 0.1 10*3/uL (ref 0.0–0.1)
Immature Granulocytes: 1 %
LYMPHS: 26 %
Lymphocytes Absolute: 3.8 10*3/uL — ABNORMAL HIGH (ref 0.7–3.1)
MCH: 30.6 pg (ref 26.6–33.0)
MCHC: 35.1 g/dL (ref 31.5–35.7)
MCV: 87 fL (ref 79–97)
Monocytes Absolute: 1.2 10*3/uL — ABNORMAL HIGH (ref 0.1–0.9)
Monocytes: 9 %
Neutrophils Absolute: 9.1 10*3/uL — ABNORMAL HIGH (ref 1.4–7.0)
Neutrophils: 62 %
RBC: 5.1 x10E6/uL (ref 3.77–5.28)
RDW: 13.7 % (ref 12.3–15.4)
WBC: 14.6 10*3/uL — AB (ref 3.4–10.8)

## 2017-08-08 LAB — LIPID PANEL WITH LDL/HDL RATIO
CHOLESTEROL TOTAL: 197 mg/dL (ref 100–199)
HDL: 49 mg/dL (ref >39)
LDL CALC: 124 mg/dL — AB (ref 0–99)
LDl/HDL Ratio: 2.5 ratio (ref 0.0–3.2)
TRIGLYCERIDES: 122 mg/dL (ref 0–149)
VLDL CHOLESTEROL CAL: 24 mg/dL (ref 5–40)

## 2017-08-08 LAB — COMPREHENSIVE METABOLIC PANEL
ALBUMIN: 4.4 g/dL (ref 3.5–5.5)
ALK PHOS: 129 IU/L — AB (ref 39–117)
ALT: 21 IU/L (ref 0–32)
AST: 24 IU/L (ref 0–40)
Albumin/Globulin Ratio: 1.3 (ref 1.2–2.2)
BUN/Creatinine Ratio: 18 (ref 9–23)
BUN: 18 mg/dL (ref 6–20)
Bilirubin Total: 0.4 mg/dL (ref 0.0–1.2)
CO2: 27 mmol/L (ref 20–29)
CREATININE: 1 mg/dL (ref 0.57–1.00)
Calcium: 9.5 mg/dL (ref 8.7–10.2)
Chloride: 96 mmol/L (ref 96–106)
GFR calc Af Amer: 83 mL/min/{1.73_m2} (ref >59)
GFR, EST NON AFRICAN AMERICAN: 72 mL/min/{1.73_m2} (ref >59)
GLOBULIN, TOTAL: 3.4 g/dL (ref 1.5–4.5)
GLUCOSE: 97 mg/dL (ref 65–99)
Potassium: 4.6 mmol/L (ref 3.5–5.2)
SODIUM: 140 mmol/L (ref 134–144)
Total Protein: 7.8 g/dL (ref 6.0–8.5)

## 2017-08-08 LAB — HEMOGLOBIN A1C
Est. average glucose Bld gHb Est-mCnc: 123 mg/dL
Hgb A1c MFr Bld: 5.9 % — ABNORMAL HIGH (ref 4.8–5.6)

## 2017-08-08 LAB — VITAMIN B12: VITAMIN B 12: 400 pg/mL (ref 232–1245)

## 2017-08-08 LAB — FOLATE: FOLATE: 13.8 ng/mL (ref >3.0)

## 2017-08-08 LAB — INSULIN, RANDOM: INSULIN: 35.3 u[IU]/mL — AB (ref 2.6–24.9)

## 2017-08-08 LAB — VITAMIN D 25 HYDROXY (VIT D DEFICIENCY, FRACTURES): Vit D, 25-Hydroxy: 9.7 ng/mL — ABNORMAL LOW (ref 30.0–100.0)

## 2017-08-17 ENCOUNTER — Other Ambulatory Visit: Payer: Self-pay | Admitting: Internal Medicine

## 2017-08-19 DIAGNOSIS — G4733 Obstructive sleep apnea (adult) (pediatric): Secondary | ICD-10-CM | POA: Diagnosis not present

## 2017-08-21 ENCOUNTER — Ambulatory Visit (INDEPENDENT_AMBULATORY_CARE_PROVIDER_SITE_OTHER): Payer: Commercial Managed Care - PPO | Admitting: Family Medicine

## 2017-08-21 VITALS — BP 101/70 | HR 96 | Temp 98.0°F | Ht 59.0 in | Wt 208.0 lb

## 2017-08-21 DIAGNOSIS — Z9189 Other specified personal risk factors, not elsewhere classified: Secondary | ICD-10-CM

## 2017-08-21 DIAGNOSIS — E66813 Obesity, class 3: Secondary | ICD-10-CM

## 2017-08-21 DIAGNOSIS — E559 Vitamin D deficiency, unspecified: Secondary | ICD-10-CM

## 2017-08-21 DIAGNOSIS — D72828 Other elevated white blood cell count: Secondary | ICD-10-CM | POA: Diagnosis not present

## 2017-08-21 DIAGNOSIS — R7303 Prediabetes: Secondary | ICD-10-CM | POA: Diagnosis not present

## 2017-08-21 DIAGNOSIS — Z6841 Body Mass Index (BMI) 40.0 and over, adult: Secondary | ICD-10-CM

## 2017-08-21 DIAGNOSIS — E7849 Other hyperlipidemia: Secondary | ICD-10-CM

## 2017-08-21 MED ORDER — VITAMIN D (ERGOCALCIFEROL) 1.25 MG (50000 UNIT) PO CAPS: 50000.0000 [IU] | ORAL_CAPSULE | ORAL | 0 refills | Status: DC

## 2017-08-21 MED ORDER — METFORMIN HCL 500 MG PO TABS: 500.0000 mg | ORAL_TABLET | Freq: Every day | ORAL | 0 refills | Status: DC

## 2017-08-22 NOTE — Progress Notes (Signed)
Office: [phone]  /  Fax: [phone]   HPI:   Chief Complaint: OBESITY Meredith Diaz is here to discuss her progress with her obesity treatment plan. She is on the Category 3 plan and is following her eating plan approximately 98 % of the time. She states she is exercising 0 minutes 0 times per week. Meredith Diaz had a hard time finishing sandwich and dinner occasionally. Not really craving anything bu thinking about food a lot.  Her weight is 208 lb (94.3 kg) today and has had a weight loss of 3 pounds over a period of 2 weeks since her last visit. She has lost 3 lbs since starting treatment with [place].  Hyperlipidemia Meredith Diaz has hyperlipidemia and has been trying to improve her cholesterol levels with intensive lifestyle modification including a low saturated fat diet, exercise and weight loss. LDL of 124, not on a statin currently. She denies any chest pain, claudication or myalgias.  Vitamin D Deficiency Meredith Diaz has a diagnosis of vitamin D deficiency. She is not on Vit D supplement currently. She notes fatigue and denies nausea, vomiting or muscle weakness.  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 No further workup planned.  Pre-Diabetes Meredith Diaz has a diagnosis of pre-diabetes based on her elevated Hgb A1c and was informed this puts her at greater risk of developing diabetes. Hgb A1c 5.9 and insulin 35.3. She is not taking metformin currently and continues to work on diet and exercise to decrease risk of diabetes. She denies nausea or hypoglycemia.  At risk for diabetes Meredith Diaz is at higher than average risk for developing diabetes due to her obesity and pre-diabetes. She currently denies polyuria or polydipsia.  ALLERGIES: Allergies  Allergen Reactions  . Lisinopril Swelling    Angioedema facial, mostly just lips, no trouble breathing    MEDICATIONS: Current Outpatient Medications on File Prior to Visit  Medication Sig  Dispense Refill  . albuterol (VENTOLIN HFA) 108 (90 Base) MCG/ACT inhaler Inhale 1-2 puffs into the lungs every 6 (six) hours as needed for wheezing or shortness of breath. 18 Inhaler 3  . budesonide-formoterol (SYMBICORT) 160-4.5 MCG/ACT inhaler INHALE 2 PUFFS INTO THE LUNGS TWICE DAILY 10.2 Inhaler 9  . DULoxetine (CYMBALTA) 60 MG capsule TAKE 1 CAPSULE (60 MG TOTAL) BY MOUTH DAILY. 30 capsule 3  . levothyroxine (SYNTHROID, LEVOTHROID) 125 MCG tablet Take 1 tablet (125 mcg total) by mouth daily. NEED OFFICE VISIT FOR FURTHER REFILLS 90 tablet 0  . metoprolol succinate (TOPROL-XL) 50 MG 24 hr tablet TAKE 1 TAB BY MOUTH DAILY WITH OR IMMEDIATELY FOLLOWING A MEAL NEEDS OFFICE VISIT FOR FURTHER REFILL 30 tablet 11  . spironolactone (ALDACTONE) 25 MG tablet TAKE 1 TABLET EVERY DAY 30 tablet 6  . torsemide (DEMADEX) 20 MG tablet Take 2 tablets (40 mg total) by mouth daily. 60 tablet 11  . valsartan (DIOVAN) 80 MG tablet Take 1 tablet (80 mg total) by mouth 2 (two) times daily. 60 tablet 11   No current facility-administered medications on file prior to visit.     PAST MEDICAL HISTORY: Past Medical History:  Diagnosis Date  . Anxiety   . Asthma   . Back pain   . Chronic combined systolic and diastolic CHF (congestive heart failure) (HCC) 07/2011   a. Prior Echo: EF was 25-30%; b. No Sig CAD on CATH; c. No significant finding on Cardiac MRI; d. follow-up Echo 2014 --  EF of 50%;  e. 03/2014 Echo:  EF 40-45%, gr 1 DD; f. 11/2014 Echo: EF 40-45%, Gr 2 DD.  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  . Depression   . Dyspnea   . Essential hypertension   . Family history of breast cancer   . Family history of colon cancer   . Fibromyalgia   . Gallbladder problem   . Hypothyroidism   . IBS (irritable bowel syndrome)   . Morbid obesity (HCC)   .  Nonischemic cardiomyopathy (HCC)    a. unclear etiology;  b. 01/2012 Cardiac MRI: no infiltrative pathology, EF 52%;  b. 11/2014 Echo: EF 40-45%, gr 2 DD.  [name] Scoliosis   . Sleep apnea   . Stomach problems   . Thyroid disease   . Vitamin B 12 deficiency   . Vitamin D deficiency     PAST SURGICAL HISTORY: Past Surgical History:  Procedure Laterality Date  . CARDIAC CATHETERIZATION  08/2011   Normal Coronaries - LVEDP 32 mmHg  . Cardiac MRI  April 2013   No suggestion of a congenital abnormality; EF ~ 52% - no regional wall motion abnormalities., no increased myocardial signal intensity. No perfusion abnormality.  . CHOLECYSTECTOMY  2013  . TRANSTHORACIC ECHOCARDIOGRAM  07/2011   Moderate concentric LVH, mildly dilated. EF 25-30% no diastolic dysfunction parameters to suggest elevated LAP  . 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 Normal wall thickness (compared to prior reading of possible apical LVH), grade 2 DD -- with elevated LV filling pressures (however left atrium was read as normal size), normal artery function and size. No no suggestion of pulmonary hypertension    SOCIAL HISTORY: Social History   Tobacco Use  . Smoking status: Never Smoker  . Smokeless tobacco: Never Used  Substance Use Topics  . Alcohol use: No  . Drug use: No    FAMILY HISTORY: Family History  Problem Relation Age of Onset  . Depression Mother   . Alcoholism Mother   . Hypertension Father   . Heart disease Father   . Colon cancer Maternal Grandmother 51  . Heart attack Maternal Grandmother   . Heart disease Maternal Grandfather   . Heart attack Maternal Grandfather   . Heart disease Paternal Grandmother   . Breast cancer Paternal Grandmother 45       possible bilateral cancer  . Asthma Paternal Grandmother   . Heart attack Paternal Grandmother   . Heart  attack Paternal Grandfather   . Breast cancer Maternal Aunt 50  . Breast cancer Paternal Aunt 86  . Stroke Neg Hx   . Thyroid disease Neg Hx     ROS: Review of Systems  Constitutional: Positive for malaise/fatigue and weight loss.  Cardiovascular: Negative for chest pain and claudication.  Gastrointestinal: Negative for nausea and vomiting.  Genitourinary: Negative for frequency.  Musculoskeletal: Negative for myalgias.       Negative muscle weakness  Endo/Heme/Allergies: Negative for polydipsia.       Negative hypoglycemia    PHYSICAL EXAM: Blood pressure 101/70, pulse 96, temperature 98 F (36.7 C), temperature source Oral, height 4\' 11"  (1.499 m), weight 208 lb (94.3 kg), SpO2 91 %. Body mass index is 42.01 kg/m. Physical Exam  Constitutional: She is oriented  to person, place, and time. She appears well-developed and well-nourished.  Cardiovascular: Normal rate.  Pulmonary/Chest: Effort normal.  Musculoskeletal: Normal range of motion.  Neurological: She is oriented to person, place, and time.  Skin: Skin is warm and dry.  Psychiatric: She has a normal mood and affect. Her behavior is normal.  Vitals reviewed.   RECENT LABS AND TESTS: BMET    Component Value Date/Time   NA 140 08/07/2017 1116   NA 138 07/01/2015 1420   K 4.6 08/07/2017 1116   K 3.4 (L) 07/01/2015 1420   CL 96 08/07/2017 1116   CO2 27 08/07/2017 1116   CO2 29 07/01/2015 1420   GLUCOSE 97 08/07/2017 1116   GLUCOSE 107 (H) 06/19/2017 0910   GLUCOSE 101 07/01/2015 1420   BUN 18 08/07/2017 1116   BUN 22.2 07/01/2015 1420   CREATININE 1.00 08/07/2017 1116   CREATININE 1.2 (H) 07/01/2015 1420   CALCIUM 9.5 08/07/2017 1116   CALCIUM 9.1 07/01/2015 1420   GFRNONAA 72 08/07/2017 1116   GFRAA 83 08/07/2017 1116   Lab Results  Component Value Date   HGBA1C 5.9 (H) 08/07/2017   HGBA1C 5.6 06/15/2015   HGBA1C 5.7 11/01/2014   Lab Results  Component Value Date   INSULIN 35.3 (H) 08/07/2017    CBC    Component Value Date/Time   WBC 14.6 (H) 08/07/2017 1116   WBC 16.6 (H) 04/08/2017 1350   RBC 5.10 08/07/2017 1116   RBC 4.68 04/08/2017 1350   HGB 15.6 08/07/2017 1116   HGB 13.3 07/12/2015 1307   HCT 44.4 08/07/2017 1116   HCT 40.3 07/12/2015 1307   PLT 389.0 04/08/2017 1350   PLT 460 (H) 07/12/2015 1307   MCV 87 08/07/2017 1116   MCV 88.7 07/12/2015 1307   MCH 30.6 08/07/2017 1116   MCH 29.9 03/16/2016 1451   MCHC 35.1 08/07/2017 1116   MCHC 33.4 04/08/2017 1350   RDW 13.7 08/07/2017 1116   RDW 13.0 07/12/2015 1307   LYMPHSABS 3.8 (H) 08/07/2017 1116   LYMPHSABS 4.1 (H) 07/12/2015 1307   MONOABS 1.1 (H) 10/02/2016 1546   MONOABS 1.4 (H) 07/12/2015 1307   EOSABS 0.3 08/07/2017 1116   BASOSABS 0.1 08/07/2017 1116   BASOSABS 0.1 07/12/2015 1307   Iron/TIBC/Ferritin/ %Sat No results found for: IRON, TIBC, FERRITIN, IRONPCTSAT Lipid Panel     Component Value Date/Time   CHOL 197 08/07/2017 1116   TRIG 122 08/07/2017 1116   HDL 49 08/07/2017 1116   CHOLHDL 4 01/28/2014 1123   VLDL 19.2 01/28/2014 1123   LDLCALC 124 (H) 08/07/2017 1116   Hepatic Function Panel     Component Value Date/Time   PROT 7.8 08/07/2017 1116   PROT 8.2 07/01/2015 1420   ALBUMIN 4.4 08/07/2017 1116   ALBUMIN 3.5 07/01/2015 1420   AST 24 08/07/2017 1116   AST 24 07/01/2015 1420   ALT 21 08/07/2017 1116   ALT 35 07/01/2015 1420   ALKPHOS 129 (H) 08/07/2017 1116   ALKPHOS 109 07/01/2015 1420   BILITOT 0.4 08/07/2017 1116   BILITOT 0.38 07/01/2015 1420   BILIDIR <0.1 (L) 01/05/2015 2226   IBILI NOT CALCULATED 01/05/2015 2226      Component Value Date/Time   TSH 1.24 07/10/2017 0814   TSH 3.41 04/10/2017 1358   TSH 18.07 (H) 10/08/2016 0904  Results for BRADEE, COMMON (MRN [id number]) as of 08/22/2017 13:19  Ref. Range 08/07/2017 11:16  Vitamin D, 25-Hydroxy Latest Ref Range: 30.0 - 100.0 ng/mL 9.7 (L)  ASSESSMENT AND PLAN: Other hyperlipidemia  Vitamin D deficiency -  Plan: Vitamin D, Ergocalciferol, (DRISDOL) 50000 units CAPS capsule  Other elevated white blood cell (WBC) count  Pre-diabetes - Plan: metFORMIN (GLUCOPHAGE) 500 MG tablet  At risk for diabetes mellitus  Class 3 severe obesity without serious comorbidity with body mass index (BMI) of 40.0 to 44.9 in adult, unspecified obesity type (HCC)  PLAN:  Hyperlipidemia Tiera was informed of the American Heart Association Guidelines emphasizing intensive lifestyle modifications as the first line treatment for hyperlipidemia. We discussed many lifestyle modifications today in depth, and Meredith Diaz will continue to work on decreasing saturated fats such as fatty red meat, butter and many fried foods. She will also increase vegetables and lean protein in her diet and continue to work on exercise and weight loss efforts. We will recheck labs in 3 months and Meredith Diaz agrees to follow up with our clinic in 2 weeks.  Vitamin D Deficiency Meredith Diaz was informed that low vitamin D levels contributes to fatigue and are associated with obesity, breast, and colon cancer. Meredith Diaz agrees to start prescription Vit D @50 ,000 IU every week #4 with no refills. She will follow up for routine testing of vitamin D, at least 2-3 times per year. She was informed of the risk of over-replacement of vitamin D and agrees to not increase her dose unless she discusses this with [place] first. We will recheck labs in 3 months and Meredith Diaz agrees to follow up with our clinic in 2 weeks.  Leukocytosis We will follow up with CBC in 3 months, depending on results may need to repeat referral to Hematology. Meredith Diaz agrees to follow up with our clinic in 2 weeks.  Pre-Diabetes Meredith Diaz will continue to work on weight loss, exercise, and decreasing simple carbohydrates in her diet to help decrease the risk of diabetes. We dicussed metformin including benefits and risks. She was informed that eating too many simple carbohydrates or too many calories at one sitting  increases the likelihood of GI side effects. Meredith Diaz agrees to start metformin 500 mg PO q AM #30 with no refills. Meredith Diaz agrees to follow up with our clinic in 2 weeks as directed to monitor her progress.  Diabetes risk counselling Meredith Diaz was given extended (30 minutes) diabetes prevention counseling today. She is 37 y.o. female and has risk factors for diabetes including obesity and pre-diabetes. We discussed intensive lifestyle modifications today with an emphasis on weight loss as well as increasing exercise and decreasing simple carbohydrates in her diet.  Obesity Meredith Diaz is currently in the action stage of change. As such, her goal is to continue with weight loss efforts She has agreed to follow the Category 3 plan Meredith Diaz has been instructed to work up to a goal of 150 minutes of combined cardio and strengthening exercise per week for weight loss and overall health benefits. We discussed the following Behavioral Modification Strategies today: increasing lean protein intake, increasing vegetables, work on meal planning and easy cooking plans, and planning for success   Meredith Diaz has agreed to follow up with our clinic in 2 weeks. She was informed of the importance of frequent follow up visits to maximize her success with intensive lifestyle modifications for her multiple health conditions.   OBESITY BEHAVIORAL INTERVENTION VISIT  Today's visit was # 2 out of 22.  Starting weight: 211 lbs Starting date: 08/07/17 Today's weight : 208 lbs  Today's date: 08/21/2017 Total lbs lost to date: 3 (Patients must lose 7 lbs in the first 6 months  to continue with counseling)   ASK: We discussed the diagnosis of obesity with Meredith Diaz today and Meredith Diaz agreed to give [place] permission to discuss obesity behavioral modification therapy today.  ASSESS: Meredith Diaz has the diagnosis of obesity and her BMI today is 41.99 Meredith Diaz is in the action stage of change   ADVISE: Meredith Diaz was educated on the multiple health  risks of obesity as well as the benefit of weight loss to improve her health. She was advised of the need for long term treatment and the importance of lifestyle modifications.  AGREE: Multiple dietary modification options and treatment options were discussed and  Meredith Diaz agreed to the above obesity treatment plan.  I, Burt Knack, am acting as transcriptionist for Debbra Riding, MD  I have reviewed the above documentation for accuracy and completeness, and I agree with the above. - Debbra Riding, MD

## 2017-09-05 ENCOUNTER — Ambulatory Visit (INDEPENDENT_AMBULATORY_CARE_PROVIDER_SITE_OTHER): Payer: Commercial Managed Care - PPO | Admitting: Family Medicine

## 2017-09-05 VITALS — BP 102/70 | HR 81 | Temp 97.4°F | Ht 59.0 in | Wt 210.0 lb

## 2017-09-05 DIAGNOSIS — E559 Vitamin D deficiency, unspecified: Secondary | ICD-10-CM

## 2017-09-05 DIAGNOSIS — Z6841 Body Mass Index (BMI) 40.0 and over, adult: Secondary | ICD-10-CM

## 2017-09-05 DIAGNOSIS — R7303 Prediabetes: Secondary | ICD-10-CM

## 2017-09-09 NOTE — Progress Notes (Signed)
Office: [phone]  /  Fax: [phone]   HPI:   Chief Complaint: OBESITY Meredith Diaz is here to discuss her progress with her obesity treatment plan. She is on the Category 3 plan and is following her eating plan approximately 50 % of the time. She states she is walking 10 to 20 minutes 6 times per week. Meredith Diaz's mother had emergency hip replacement surgery and Meredith Diaz had to fly out to help. Meredith Diaz did not eat much according to the meal plan. Her weight is 210 lb (95.3 kg) today and has had a weight gain of 2 pounds over a period of 2 weeks since her last visit. She has lost 1 lb since starting treatment with [place].  Vitamin D deficiency Meredith Diaz has a diagnosis of vitamin D deficiency. She is currently taking vit D and fatigue is improving. She denies nausea, vomiting or muscle weakness.  Pre-Diabetes Meredith Diaz has a diagnosis of pre-diabetes based on her elevated Hgb A1c and was informed this puts her at greater risk of developing diabetes. She is taking metformin currently and continues to work on diet and exercise to decrease risk of diabetes. She denies nausea or hypoglycemia.  ALLERGIES: Allergies  Allergen Reactions  . Lisinopril Swelling    Angioedema facial, mostly just lips, no trouble breathing    MEDICATIONS: Current Outpatient Medications on File Prior to Visit  Medication Sig Dispense Refill  . albuterol (VENTOLIN HFA) 108 (90 Base) MCG/ACT inhaler Inhale 1-2 puffs into the lungs every 6 (six) hours as needed for wheezing or shortness of breath. 18 Inhaler 3  . budesonide-formoterol (SYMBICORT) 160-4.5 MCG/ACT inhaler INHALE 2 PUFFS INTO THE LUNGS TWICE DAILY 10.2 Inhaler 9  . DULoxetine (CYMBALTA) 60 MG capsule TAKE 1 CAPSULE (60 MG TOTAL) BY MOUTH DAILY. 30 capsule 3  . levothyroxine (SYNTHROID, LEVOTHROID) 125 MCG tablet Take 1 tablet (125 mcg total) by mouth daily. NEED OFFICE VISIT FOR FURTHER REFILLS 90 tablet 0  . metFORMIN (GLUCOPHAGE) 500 MG tablet Take 1 tablet (500 mg  total) by mouth daily with breakfast. 30 tablet 0  . metoprolol succinate (TOPROL-XL) 50 MG 24 hr tablet TAKE 1 TAB BY MOUTH DAILY WITH OR IMMEDIATELY FOLLOWING A MEAL NEEDS OFFICE VISIT FOR FURTHER REFILL 30 tablet 11  . spironolactone (ALDACTONE) 25 MG tablet TAKE 1 TABLET EVERY DAY 30 tablet 6  . torsemide (DEMADEX) 20 MG tablet Take 2 tablets (40 mg total) by mouth daily. 60 tablet 11  . valsartan (DIOVAN) 80 MG tablet Take 1 tablet (80 mg total) by mouth 2 (two) times daily. 60 tablet 11  . Vitamin D, Ergocalciferol, (DRISDOL) 50000 units CAPS capsule Take 1 capsule (50,000 Units total) by mouth every 7 (seven) days. 4 capsule 0   No current facility-administered medications on file prior to visit.     PAST MEDICAL HISTORY: Past Medical History:  Diagnosis Date  . Anxiety   . Asthma   . Back pain   . Chronic combined systolic and diastolic CHF (congestive heart failure) (HCC) 07/2011   a. Prior Echo: EF was 25-30%; b. No Sig CAD on CATH; c. No significant finding on Cardiac MRI; d. follow-up Echo 2014 --  EF of 50%;  e. 03/2014 Echo: EF 40-45%, gr 1 DD; f. 11/2014 Echo: EF 40-45%, Gr 2 DD.  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  [name]  Depression   . Dyspnea   . Essential hypertension   . Family history of breast cancer   . Family history of colon cancer   . Fibromyalgia   . Gallbladder problem   . Hypothyroidism   . IBS (irritable bowel syndrome)   . Morbid obesity (HCC)   . Nonischemic cardiomyopathy (HCC)    a. unclear etiology;  b. 01/2012 Cardiac MRI: no infiltrative pathology, EF 52%;  b. 11/2014 Echo: EF 40-45%, gr 2 DD.  [name] Scoliosis   . Sleep apnea   . Stomach problems   . Thyroid disease   . Vitamin B 12 deficiency   . Vitamin D deficiency     PAST SURGICAL HISTORY: Past Surgical History:  Procedure Laterality  Date  . CARDIAC CATHETERIZATION  08/2011   Normal Coronaries - LVEDP 32 mmHg  . Cardiac MRI  April 2013   No suggestion of a congenital abnormality; EF ~ 52% - no regional wall motion abnormalities., no increased myocardial signal intensity. No perfusion abnormality.  . CHOLECYSTECTOMY  2013  . TRANSTHORACIC ECHOCARDIOGRAM  07/2011   Moderate concentric LVH, mildly dilated. EF 25-30% no diastolic dysfunction parameters to suggest elevated LAP  . 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 Normal wall thickness (compared to prior reading of possible apical LVH), grade 2 DD -- with elevated LV filling pressures (however left atrium was read as normal size), normal artery function and size. No no suggestion of pulmonary hypertension    SOCIAL HISTORY: Social History   Tobacco Use  . Smoking status: Never Smoker  . Smokeless tobacco: Never Used  Substance Use Topics  . Alcohol use: No  . Drug use: No    FAMILY HISTORY: Family History  Problem Relation Age of Onset  . Depression Mother   . Alcoholism Mother   . Hypertension Father   . Heart disease Father   . Colon cancer Maternal Grandmother 50  . Heart attack Maternal Grandmother   . Heart disease Maternal Grandfather   . Heart attack Maternal Grandfather   . Heart disease Paternal Grandmother   . Breast cancer Paternal Grandmother 50       possible bilateral cancer  . Asthma Paternal Grandmother   . Heart attack Paternal Grandmother   . Heart attack Paternal Grandfather   . Breast cancer Maternal Aunt 50  . Breast cancer Paternal Aunt 51  . Stroke Neg Hx   . Thyroid disease Neg Hx     ROS: Review of Systems  Constitutional: Positive for malaise/fatigue. Negative for weight loss.  Gastrointestinal: Negative for nausea and vomiting.  Musculoskeletal:       Negative for muscle weakness      PHYSICAL EXAM: Blood pressure 102/70, pulse 81, temperature (!) 97.4 F (36.3 C), height 4\' 11"  (1.499 m), weight 210 lb (95.3 kg), SpO2 95 %. Body mass index is 42.41 kg/m. Physical Exam  Constitutional: She is oriented to person, place, and time. She appears well-developed and well-nourished.  Cardiovascular: Normal rate.  Pulmonary/Chest: Effort normal.  Musculoskeletal: Normal range of motion.  Neurological: She is oriented to person, place, and time.  Skin: Skin is warm and dry.  Psychiatric: She has a normal mood and affect. Her behavior is normal.  Vitals reviewed.   RECENT LABS AND TESTS: BMET    Component Value Date/Time   NA 140 08/07/2017 1116   NA  138 07/01/2015 1420   K 4.6 08/07/2017 1116   K 3.4 (L) 07/01/2015 1420   CL 96 08/07/2017 1116   CO2 27 08/07/2017 1116   CO2 29 07/01/2015 1420   GLUCOSE 97 08/07/2017 1116   GLUCOSE 107 (H) 06/19/2017 0910   GLUCOSE 101 07/01/2015 1420   BUN 18 08/07/2017 1116   BUN 22.2 07/01/2015 1420   CREATININE 1.00 08/07/2017 1116   CREATININE 1.2 (H) 07/01/2015 1420   CALCIUM 9.5 08/07/2017 1116   CALCIUM 9.1 07/01/2015 1420   GFRNONAA 72 08/07/2017 1116   GFRAA 83 08/07/2017 1116   Lab Results  Component Value Date   HGBA1C 5.9 (H) 08/07/2017   HGBA1C 5.6 06/15/2015   HGBA1C 5.7 11/01/2014   Lab Results  Component Value Date   INSULIN 35.3 (H) 08/07/2017   CBC    Component Value Date/Time   WBC 14.6 (H) 08/07/2017 1116   WBC 16.6 (H) 04/08/2017 1350   RBC 5.10 08/07/2017 1116   RBC 4.68 04/08/2017 1350   HGB 15.6 08/07/2017 1116   HGB 13.3 07/12/2015 1307   HCT 44.4 08/07/2017 1116   HCT 40.3 07/12/2015 1307   PLT 389.0 04/08/2017 1350   PLT 460 (H) 07/12/2015 1307   MCV 87 08/07/2017 1116   MCV 88.7 07/12/2015 1307   MCH 30.6 08/07/2017 1116   MCH 29.9 03/16/2016 1451   MCHC 35.1 08/07/2017 1116   MCHC 33.4 04/08/2017 1350   RDW 13.7 08/07/2017 1116   RDW 13.0 07/12/2015 1307   LYMPHSABS 3.8  (H) 08/07/2017 1116   LYMPHSABS 4.1 (H) 07/12/2015 1307   MONOABS 1.1 (H) 10/02/2016 1546   MONOABS 1.4 (H) 07/12/2015 1307   EOSABS 0.3 08/07/2017 1116   BASOSABS 0.1 08/07/2017 1116   BASOSABS 0.1 07/12/2015 1307   Iron/TIBC/Ferritin/ %Sat No results found for: IRON, TIBC, FERRITIN, IRONPCTSAT Lipid Panel     Component Value Date/Time   CHOL 197 08/07/2017 1116   TRIG 122 08/07/2017 1116   HDL 49 08/07/2017 1116   CHOLHDL 4 01/28/2014 1123   VLDL 19.2 01/28/2014 1123   LDLCALC 124 (H) 08/07/2017 1116   Hepatic Function Panel     Component Value Date/Time   PROT 7.8 08/07/2017 1116   PROT 8.2 07/01/2015 1420   ALBUMIN 4.4 08/07/2017 1116   ALBUMIN 3.5 07/01/2015 1420   AST 24 08/07/2017 1116   AST 24 07/01/2015 1420   ALT 21 08/07/2017 1116   ALT 35 07/01/2015 1420   ALKPHOS 129 (H) 08/07/2017 1116   ALKPHOS 109 07/01/2015 1420   BILITOT 0.4 08/07/2017 1116   BILITOT 0.38 07/01/2015 1420   BILIDIR <0.1 (L) 01/05/2015 2226   IBILI NOT CALCULATED 01/05/2015 2226      Component Value Date/Time   TSH 1.24 07/10/2017 0814   TSH 3.41 04/10/2017 1358   TSH 18.07 (H) 10/08/2016 0904   Results for SHERRINA, ZAUGG (MRN [id number]) as of 09/09/2017 11:14  Ref. Range 08/07/2017 11:16  Vitamin D, 25-Hydroxy Latest Ref Range: 30.0 - 100.0 ng/mL 9.7 (L)   ASSESSMENT AND PLAN: Vitamin D deficiency  Prediabetes  Class 3 severe obesity with serious comorbidity and body mass index (BMI) of 40.0 to 44.9 in adult, unspecified obesity type (HCC)  PLAN:  Vitamin D Deficiency Meredith Diaz was informed that low vitamin D levels contributes to fatigue and are associated with obesity, breast, and colon cancer. She agrees to continue to take prescription Vit D @50 ,000 IU every week (no refill needed) and will follow  up for routine testing of vitamin D, at least 2-3 times per year. She was informed of the risk of over-replacement of vitamin D and agrees to not increase her dose unless she discusses  this with [place] first.  Pre-Diabetes Meredith Diaz will continue to work on weight loss, exercise, and decreasing simple carbohydrates in her diet to help decrease the risk of diabetes. We dicussed metformin including benefits and risks. She was informed that eating too many simple carbohydrates or too many calories at one sitting increases the likelihood of GI side effects. Meredith Diaz agrees to continue metformin for now and a prescription was not written today. Meredith Diaz agreed to follow up with [place] as directed to monitor her progress.  We spent > than 50% of the 15 minute visit on the counseling as documented in the note.  Obesity Meredith Diaz is currently in the action stage of change. As such, her goal is to continue with weight loss efforts She has agreed to follow the Category 3 plan Meredith Diaz has been instructed to work up to a goal of 150 minutes of combined cardio and strengthening exercise per week for weight loss and overall health benefits. We discussed the following Behavioral Modification Strategies today: increase H2O intake, keeping healthy foods in the home, planning for success, increasing lean protein intake and increasing vegetables  Meredith Diaz has agreed to follow up with our clinic in 2 weeks. She was informed of the importance of frequent follow up visits to maximize her success with intensive lifestyle modifications for her multiple health conditions.   OBESITY BEHAVIORAL INTERVENTION VISIT  Today's visit was # 3 out of 22.  Starting weight: 211 lbs Starting date: 08/07/17 Today's weight : 210 lbs  Today's date: 09/05/2017 Total lbs lost to date: 1 (Patients must lose 7 lbs in the first 6 months to continue with counseling)   ASK: We discussed the diagnosis of obesity with Meredith Diaz today and Meredith Diaz agreed to give [place] permission to discuss obesity behavioral modification therapy today.  ASSESS: Meredith Diaz has the diagnosis of obesity and her BMI today is 42.39 Meredith Diaz is in the action stage of change     ADVISE: Meredith Diaz was educated on the multiple health risks of obesity as well as the benefit of weight loss to improve her health. She was advised of the need for long term treatment and the importance of lifestyle modifications.  AGREE: Multiple dietary modification options and treatment options were discussed and  Meredith Diaz agreed to the above obesity treatment plan.  I, [place] Crane, am acting as transcriptionist for Filbert Schilder, MD  I have reviewed the above documentation for accuracy and completeness, and I agree with the above. - Debbra Riding, MD

## 2017-09-12 ENCOUNTER — Other Ambulatory Visit: Payer: Self-pay | Admitting: Internal Medicine

## 2017-09-18 DIAGNOSIS — G4733 Obstructive sleep apnea (adult) (pediatric): Secondary | ICD-10-CM | POA: Diagnosis not present

## 2017-09-19 ENCOUNTER — Other Ambulatory Visit: Payer: Self-pay | Admitting: Pulmonary Disease

## 2017-09-19 ENCOUNTER — Ambulatory Visit (INDEPENDENT_AMBULATORY_CARE_PROVIDER_SITE_OTHER): Payer: Commercial Managed Care - PPO | Admitting: Physician Assistant

## 2017-09-19 VITALS — BP 94/67 | HR 81 | Temp 98.2°F | Ht 59.0 in | Wt 214.0 lb

## 2017-09-19 DIAGNOSIS — I9589 Other hypotension: Secondary | ICD-10-CM

## 2017-09-19 DIAGNOSIS — Z6841 Body Mass Index (BMI) 40.0 and over, adult: Secondary | ICD-10-CM | POA: Diagnosis not present

## 2017-09-19 DIAGNOSIS — E559 Vitamin D deficiency, unspecified: Secondary | ICD-10-CM

## 2017-09-19 DIAGNOSIS — Z9189 Other specified personal risk factors, not elsewhere classified: Secondary | ICD-10-CM

## 2017-09-19 MED ORDER — VITAMIN D (ERGOCALCIFEROL) 1.25 MG (50000 UNIT) PO CAPS: 50000.0000 [IU] | ORAL_CAPSULE | ORAL | 0 refills | Status: DC

## 2017-09-19 NOTE — Progress Notes (Signed)
Office: [phone]  /  Fax: [phone]   HPI:   Chief Complaint: OBESITY Meredith Diaz is here to discuss her progress with her obesity treatment plan. She is on the follow the Category 3 plan and is following her eating plan approximately 50 % of the time. She states she is exercising 0 minutes 0 times per week. Betrice has not been following the meal plan. She states she is very "unhappy" with the structured meal plan and would like more meal planning ideas, and more variety with her meals. Her weight is 214 lb (97.1 kg) today and has had a weight gain of 4 pounds over a period of 2 weeks since her last visit. She has gained 3 lbs since starting treatment with [place].  Vitamin D deficiency Meredith Diaz has a diagnosis of vitamin D deficiency. She is currently taking vit D and denies nausea, vomiting or muscle weakness.  Hypotension Meredith Diaz is a 37 y.o. female with hypotension. Her blood pressure is at 94/67 today and she is on metoprolol, spironolactone, torsemide, and valsartan. She has a history of congestive heart failure and valsartan was recently increased to 80 mg by Cardiologist.  She declines any adjustment to her BP medications today and agrees to follow up with her PCP or Cardiologist for further adjustment of her CHF medications that are contributing to her low BP.  She is working weight loss to help control her blood pressure with the goal of decreasing her risk of heart attack and stroke. Meredith Diaz blood pressure is not currently controlled.  At risk for cardiovascular disease Meredith Diaz is at a higher than average risk for cardiovascular disease due to obesity and hypotension. She currently denies any chest pain.  ALLERGIES: Allergies  Allergen Reactions  . Lisinopril Swelling    Angioedema facial, mostly just lips, no trouble breathing    MEDICATIONS: Current Outpatient Medications on File Prior to Visit  Medication Sig Dispense Refill  . albuterol (VENTOLIN HFA) 108 (90 Base) MCG/ACT  inhaler Inhale 1-2 puffs into the lungs every 6 (six) hours as needed for wheezing or shortness of breath. 18 Inhaler 3  . budesonide-formoterol (SYMBICORT) 160-4.5 MCG/ACT inhaler INHALE 2 PUFFS INTO THE LUNGS TWICE DAILY 10.2 Inhaler 9  . DULoxetine (CYMBALTA) 60 MG capsule TAKE 1 CAPSULE (60 MG TOTAL) BY MOUTH DAILY. 90 capsule 2  . levothyroxine (SYNTHROID, LEVOTHROID) 125 MCG tablet Take 1 tablet (125 mcg total) by mouth daily. NEED OFFICE VISIT FOR FURTHER REFILLS 90 tablet 0  . metFORMIN (GLUCOPHAGE) 500 MG tablet Take 1 tablet (500 mg total) by mouth daily with breakfast. 30 tablet 0  . metoprolol succinate (TOPROL-XL) 50 MG 24 hr tablet TAKE 1 TAB BY MOUTH DAILY WITH OR IMMEDIATELY FOLLOWING A MEAL NEEDS OFFICE VISIT FOR FURTHER REFILL 30 tablet 11  . spironolactone (ALDACTONE) 25 MG tablet TAKE 1 TABLET EVERY DAY 30 tablet 6  . torsemide (DEMADEX) 20 MG tablet Take 2 tablets (40 mg total) by mouth daily. 60 tablet 11  . valsartan (DIOVAN) 80 MG tablet Take 1 tablet (80 mg total) by mouth 2 (two) times daily. 60 tablet 11   No current facility-administered medications on file prior to visit.     PAST MEDICAL HISTORY: Past Medical History:  Diagnosis Date  . Anxiety   . Asthma   . Back pain   . Chronic combined systolic and diastolic CHF (congestive heart failure) (HCC) 07/2011   a. Prior Echo: EF was 25-30%; b. No Sig CAD on CATH; c. No  significant finding on Cardiac MRI; d. follow-up Echo 2014 --  EF of 50%;  e. 03/2014 Echo: EF 40-45%, gr 1 DD; f. 11/2014 Echo: EF 40-45%, Gr 2 DD.  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  . Depression   . Dyspnea   . Essential hypertension   . Family history of breast cancer   . Family history of colon cancer   . Fibromyalgia   . Gallbladder problem   . Hypothyroidism   . IBS (irritable  bowel syndrome)   . Morbid obesity (HCC)   . Nonischemic cardiomyopathy (HCC)    a. unclear etiology;  b. 01/2012 Cardiac MRI: no infiltrative pathology, EF 52%;  b. 11/2014 Echo: EF 40-45%, gr 2 DD.  [name] Scoliosis   . Sleep apnea   . Stomach problems   . Thyroid disease   . Vitamin B 12 deficiency   . Vitamin D deficiency     PAST SURGICAL HISTORY: Past Surgical History:  Procedure Laterality Date  . CARDIAC CATHETERIZATION  08/2011   Normal Coronaries - LVEDP 32 mmHg  . Cardiac MRI  April 2013   No suggestion of a congenital abnormality; EF ~ 52% - no regional wall motion abnormalities., no increased myocardial signal intensity. No perfusion abnormality.  . CHOLECYSTECTOMY  2013  . TRANSTHORACIC ECHOCARDIOGRAM  07/2011   Moderate concentric LVH, mildly dilated. EF 25-30% no diastolic dysfunction parameters to suggest elevated LAP  . 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 Normal wall thickness (compared to prior reading of possible apical LVH), grade 2 DD -- with elevated LV filling pressures (however left atrium was read as normal size), normal artery function and size. No no suggestion of pulmonary hypertension    SOCIAL HISTORY: Social History   Tobacco Use  . Smoking status: Never Smoker  . Smokeless tobacco: Never Used  Substance Use Topics  . Alcohol use: No  . Drug use: No    FAMILY HISTORY: Family History  Problem Relation Age of Onset  . Depression Mother   . Alcoholism Mother   . Hypertension Father   . Heart disease Father   . Colon cancer Maternal Grandmother 41  . Heart attack Maternal Grandmother   . Heart disease Maternal Grandfather   . Heart attack Maternal Grandfather   . Heart disease Paternal Grandmother   . Breast cancer Paternal Grandmother 43       possible bilateral cancer  . Asthma Paternal Grandmother     . Heart attack Paternal Grandmother   . Heart attack Paternal Grandfather   . Breast cancer Maternal Aunt 50  . Breast cancer Paternal Aunt 56  . Stroke Neg Hx   . Thyroid disease Neg Hx     ROS: Review of Systems  Constitutional: Negative for weight loss.  Cardiovascular: Negative for chest pain.  Gastrointestinal: Negative for nausea and vomiting.  Musculoskeletal:       Negative for muscle weakness    PHYSICAL EXAM: Blood pressure 94/67, pulse 81, temperature 98.2 F (36.8 C), temperature source Oral, height 4\' 11"  (1.499 m), weight 214 lb (97.1 kg), SpO2 91 %. Body mass index is 43.22 kg/m. Physical Exam  Constitutional: She is oriented to person, place, and time.  She appears well-developed and well-nourished.  Cardiovascular: Normal rate.  Pulmonary/Chest: Effort normal.  Musculoskeletal: Normal range of motion.  Neurological: She is oriented to person, place, and time.  Skin: Skin is warm and dry.  Psychiatric: She has a normal mood and affect. Her behavior is normal.  Vitals reviewed.   RECENT LABS AND TESTS: BMET    Component Value Date/Time   NA 140 08/07/2017 1116   NA 138 07/01/2015 1420   K 4.6 08/07/2017 1116   K 3.4 (L) 07/01/2015 1420   CL 96 08/07/2017 1116   CO2 27 08/07/2017 1116   CO2 29 07/01/2015 1420   GLUCOSE 97 08/07/2017 1116   GLUCOSE 107 (H) 06/19/2017 0910   GLUCOSE 101 07/01/2015 1420   BUN 18 08/07/2017 1116   BUN 22.2 07/01/2015 1420   CREATININE 1.00 08/07/2017 1116   CREATININE 1.2 (H) 07/01/2015 1420   CALCIUM 9.5 08/07/2017 1116   CALCIUM 9.1 07/01/2015 1420   GFRNONAA 72 08/07/2017 1116   GFRAA 83 08/07/2017 1116   Lab Results  Component Value Date   HGBA1C 5.9 (H) 08/07/2017   HGBA1C 5.6 06/15/2015   HGBA1C 5.7 11/01/2014   Lab Results  Component Value Date   INSULIN 35.3 (H) 08/07/2017   CBC    Component Value Date/Time   WBC 14.6 (H) 08/07/2017 1116   WBC 16.6 (H) 04/08/2017 1350   RBC 5.10 08/07/2017 1116    RBC 4.68 04/08/2017 1350   HGB 15.6 08/07/2017 1116   HGB 13.3 07/12/2015 1307   HCT 44.4 08/07/2017 1116   HCT 40.3 07/12/2015 1307   PLT 389.0 04/08/2017 1350   PLT 460 (H) 07/12/2015 1307   MCV 87 08/07/2017 1116   MCV 88.7 07/12/2015 1307   MCH 30.6 08/07/2017 1116   MCH 29.9 03/16/2016 1451   MCHC 35.1 08/07/2017 1116   MCHC 33.4 04/08/2017 1350   RDW 13.7 08/07/2017 1116   RDW 13.0 07/12/2015 1307   LYMPHSABS 3.8 (H) 08/07/2017 1116   LYMPHSABS 4.1 (H) 07/12/2015 1307   MONOABS 1.1 (H) 10/02/2016 1546   MONOABS 1.4 (H) 07/12/2015 1307   EOSABS 0.3 08/07/2017 1116   BASOSABS 0.1 08/07/2017 1116   BASOSABS 0.1 07/12/2015 1307   Iron/TIBC/Ferritin/ %Sat No results found for: IRON, TIBC, FERRITIN, IRONPCTSAT Lipid Panel     Component Value Date/Time   CHOL 197 08/07/2017 1116   TRIG 122 08/07/2017 1116   HDL 49 08/07/2017 1116   CHOLHDL 4 01/28/2014 1123   VLDL 19.2 01/28/2014 1123   LDLCALC 124 (H) 08/07/2017 1116   Hepatic Function Panel     Component Value Date/Time   PROT 7.8 08/07/2017 1116   PROT 8.2 07/01/2015 1420   ALBUMIN 4.4 08/07/2017 1116   ALBUMIN 3.5 07/01/2015 1420   AST 24 08/07/2017 1116   AST 24 07/01/2015 1420   ALT 21 08/07/2017 1116   ALT 35 07/01/2015 1420   ALKPHOS 129 (H) 08/07/2017 1116   ALKPHOS 109 07/01/2015 1420   BILITOT 0.4 08/07/2017 1116   BILITOT 0.38 07/01/2015 1420   BILIDIR <0.1 (L) 01/05/2015 2226   IBILI NOT CALCULATED 01/05/2015 2226      Component Value Date/Time   TSH 1.24 07/10/2017 0814   TSH 3.41 04/10/2017 1358   TSH 18.07 (H) 10/08/2016 0904   Results for KALYNNE, WOMAC (MRN [id number]) as of 09/19/2017 12:21  Ref. Range 08/07/2017 11:16  Vitamin D, 25-Hydroxy Latest Ref Range: 30.0 - 100.0 ng/mL 9.7 (L)   ASSESSMENT AND  PLAN: Vitamin D deficiency - Plan: Vitamin D, Ergocalciferol, (DRISDOL) 50000 units CAPS capsule  Other specified hypotension  At risk for heart disease  Class 3 severe obesity  with serious comorbidity and body mass index (BMI) of 40.0 to 44.9 in adult, unspecified obesity type (HCC)  PLAN:  Vitamin D Deficiency Meredith Diaz was informed that low vitamin D levels contributes to fatigue and are associated with obesity, breast, and colon cancer. She agrees to continue to take prescription Vit D @50 ,000 IU every week #4 with no refills and will follow up for routine testing of vitamin D, at least 2-3 times per year. She was informed of the risk of over-replacement of vitamin D and agrees to not increase her dose unless she discusses this with [place] first. Meredith Diaz agrees to follow up as directed.  Hypotension We discussed sodium restriction, working on healthy weight loss, and a regular exercise program as the means to achieve improved blood pressure control. We will continue to monitor her blood pressure as well as her progress with the above lifestyle modifications. She will follow up with her primary care provider for further management of hypotension.   Cardiovascular risk counseling Meredith Diaz was given extended (15 minutes) coronary artery disease prevention counseling today. She is 37 y.o. female and has risk factors for heart disease including obesity and hypotension. We discussed intensive lifestyle modifications today with an emphasis on specific weight loss instructions and strategies. Pt was also informed of the importance of increasing exercise and decreasing saturated fats to help prevent heart disease.  Obesity Meredith Diaz is currently in the action stage of change. As such, her goal is to continue with weight loss efforts She has agreed to follow the Pescatarian eating plan Meredith Diaz has been instructed to work up to a goal of 150 minutes of combined cardio and strengthening exercise per week for weight loss and overall health benefits. We discussed the following Behavioral Modification Strategies today: increasing lean protein intake and work on meal planning and easy cooking  plans  Meredith Diaz has agreed to follow up with our clinic in 3 weeks. She was informed of the importance of frequent follow up visits to maximize her success with intensive lifestyle modifications for her multiple health conditions.    OBESITY BEHAVIORAL INTERVENTION VISIT  Today's visit was # 4 out of 22.  Starting weight: 211 lbs Starting date: 08/07/17 Today's weight : 214 lbs Today's date: 09/19/2017 Total lbs lost to date: 3 (Patients must lose 7 lbs in the first 6 months to continue with counseling)   ASK: We discussed the diagnosis of obesity with Meredith Diaz today and Meredith Diaz agreed to give [place] permission to discuss obesity behavioral modification therapy today.  ASSESS: Meredith Diaz has the diagnosis of obesity and her BMI today is 43.2 Meredith Diaz is in the action stage of change   ADVISE: Meredith Diaz was educated on the multiple health risks of obesity as well as the benefit of weight loss to improve her health. She was advised of the need for long term treatment and the importance of lifestyle modifications.  AGREE: Multiple dietary modification options and treatment options were discussed and  Meredith Diaz agreed to the above obesity treatment plan.   Meredith Diaz, am acting as transcriptionist for [employer], PA-C I, [facility], have reviewed this note and agree with its content

## 2017-09-23 ENCOUNTER — Other Ambulatory Visit (HOSPITAL_COMMUNITY): Payer: Self-pay

## 2017-09-23 ENCOUNTER — Telehealth (HOSPITAL_COMMUNITY): Payer: Self-pay | Admitting: *Deleted

## 2017-09-23 MED ORDER — VALSARTAN 40 MG PO TABS: ORAL_TABLET | ORAL | 3 refills | Status: DC

## 2017-09-23 NOTE — Telephone Encounter (Signed)
Pt aware and agreeable to med changes. no further questions

## 2017-09-23 NOTE — Telephone Encounter (Signed)
Pt called c/o low bp running consistently around 90/60. She has been more fatigued and experienced some dizziness. She is in the healthy weight loss program and they suggested she ask Dr.McLean if he could decrease her Valsartan. Also they wanted to start metformin for prediabetes but she wanted to make sure that was ok to take. I told her I would follow up with Dr.McLean and give her a call with any recommendations.   Message routed to Dr.McLean

## 2017-09-23 NOTE — Telephone Encounter (Signed)
Decrease valsartan to 40 qam/80 qpm.

## 2017-10-03 ENCOUNTER — Ambulatory Visit (INDEPENDENT_AMBULATORY_CARE_PROVIDER_SITE_OTHER): Payer: Commercial Managed Care - PPO | Admitting: Physician Assistant

## 2017-10-03 VITALS — BP 86/64 | HR 82 | Temp 98.3°F | Ht 59.0 in | Wt 211.0 lb

## 2017-10-03 DIAGNOSIS — Z6841 Body Mass Index (BMI) 40.0 and over, adult: Secondary | ICD-10-CM | POA: Diagnosis not present

## 2017-10-03 DIAGNOSIS — I10 Essential (primary) hypertension: Secondary | ICD-10-CM

## 2017-10-03 NOTE — Progress Notes (Signed)
Office: [phone]  /  Fax: [phone]   HPI:   Chief Complaint: OBESITY Meredith Diaz is here to discuss her progress with her obesity treatment plan. She is on the Pescatarian eating plan and is following her eating plan approximately 75 % of the time. She states she is walking for 10 minutes 7 times per week. Meredith Diaz continues to do well with weight loss. She would like travel eating strategies.  Her weight is 211 lb (95.7 kg) today and has had a weight loss of 3 pounds over a period of 2 weeks since her last visit. She has lost 0 lbs since starting treatment with [place].  Hypertension Meredith Diaz is a 37 y.o. female with hypertension. Meredith Diaz's blood pressure is low. Her blood pressure medication has been decreased due to hypotension by her Cardiologist. She denies chest pain or shortness of breath. She is working weight loss to help control her blood pressure with the goal of decreasing her risk of heart attack and stroke. Meredith Diaz's blood pressure is not currently controlled.  ALLERGIES: Allergies  Allergen Reactions  . Lisinopril Swelling    Angioedema facial, mostly just lips, no trouble breathing    MEDICATIONS: Current Outpatient Medications on File Prior to Visit  Medication Sig Dispense Refill  . budesonide-formoterol (SYMBICORT) 160-4.5 MCG/ACT inhaler INHALE 2 PUFFS INTO THE LUNGS TWICE DAILY 10.2 Inhaler 9  . DULoxetine (CYMBALTA) 60 MG capsule TAKE 1 CAPSULE (60 MG TOTAL) BY MOUTH DAILY. 90 capsule 2  . levothyroxine (SYNTHROID, LEVOTHROID) 125 MCG tablet Take 1 tablet (125 mcg total) by mouth daily. NEED OFFICE VISIT FOR FURTHER REFILLS 90 tablet 0  . metFORMIN (GLUCOPHAGE) 500 MG tablet Take 1 tablet (500 mg total) by mouth daily with breakfast. 30 tablet 0  . metoprolol succinate (TOPROL-XL) 50 MG 24 hr tablet TAKE 1 TAB BY MOUTH DAILY WITH OR IMMEDIATELY FOLLOWING A MEAL NEEDS OFFICE VISIT FOR FURTHER REFILL 30 tablet 11  . spironolactone (ALDACTONE) 25 MG tablet TAKE 1 TABLET  EVERY DAY 30 tablet 6  . torsemide (DEMADEX) 20 MG tablet Take 2 tablets (40 mg total) by mouth daily. 60 tablet 11  . valsartan (DIOVAN) 40 MG tablet Take 1 tablet (40 mg total) by mouth every morning AND 2 tablets (80 mg total) every evening. 90 tablet 3  . VENTOLIN HFA 108 (90 Base) MCG/ACT inhaler INHALE 1-2 PUFFS INTO THE LUNGS EVERY 6 (SIX) HOURS AS NEEDED FOR WHEEZING OR SHORTNESS OF BREATH. 18 Inhaler 3  . Vitamin D, Ergocalciferol, (DRISDOL) 50000 units CAPS capsule Take 1 capsule (50,000 Units total) by mouth every 7 (seven) days. 4 capsule 0   No current facility-administered medications on file prior to visit.     PAST MEDICAL HISTORY: Past Medical History:  Diagnosis Date  . Anxiety   . Asthma   . Back pain   . Chronic combined systolic and diastolic CHF (congestive heart failure) (HCC) 07/2011   a. Prior Echo: EF was 25-30%; b. No Sig CAD on CATH; c. No significant finding on Cardiac MRI; d. follow-up Echo 2014 --  EF of 50%;  e. 03/2014 Echo: EF 40-45%, gr 1 DD; f. 11/2014 Echo: EF 40-45%, Gr 2 DD.  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  . Depression   . Dyspnea   . Essential hypertension   . Family history  of breast cancer   . Family history of colon cancer   . Fibromyalgia   . Gallbladder problem   . Hypothyroidism   . IBS (irritable bowel syndrome)   . Morbid obesity (HCC)   . Nonischemic cardiomyopathy (HCC)    a. unclear etiology;  b. 01/2012 Cardiac MRI: no infiltrative pathology, EF 52%;  b. 11/2014 Echo: EF 40-45%, gr 2 DD.  Meredith Diaz Kitchen Scoliosis   . Sleep apnea   . Stomach problems   . Thyroid disease   . Vitamin B 12 deficiency   . Vitamin D deficiency     PAST SURGICAL HISTORY: Past Surgical History:  Procedure Laterality Date  . CARDIAC CATHETERIZATION  08/2011   Normal Coronaries - LVEDP 32 mmHg  .  Cardiac MRI  April 2013   No suggestion of a congenital abnormality; EF ~ 52% - no regional wall motion abnormalities., no increased myocardial signal intensity. No perfusion abnormality.  . CHOLECYSTECTOMY  2013  . TRANSTHORACIC ECHOCARDIOGRAM  07/2011   Moderate concentric LVH, mildly dilated. EF 25-30% no diastolic dysfunction parameters to suggest elevated LAP  . 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 Normal wall thickness (compared to prior reading of possible apical LVH), grade 2 DD -- with elevated LV filling pressures (however left atrium was read as normal size), normal artery function and size. No no suggestion of pulmonary hypertension    SOCIAL HISTORY: Social History   Tobacco Use  . Smoking status: Never Smoker  . Smokeless tobacco: Never Used  Substance Use Topics  . Alcohol use: No  . Drug use: No    FAMILY HISTORY: Family History  Problem Relation Age of Onset  . Depression Mother   . Alcoholism Mother   . Hypertension Father   . Heart disease Father   . Colon cancer Maternal Grandmother 60  . Heart attack Maternal Grandmother   . Heart disease Maternal Grandfather   . Heart attack Maternal Grandfather   . Heart disease Paternal Grandmother   . Breast cancer Paternal Grandmother 69       possible bilateral cancer  . Asthma Paternal Grandmother   . Heart attack Paternal Grandmother   . Heart attack Paternal Grandfather   . Breast cancer Maternal Aunt 50  . Breast cancer Paternal Aunt 43  . Stroke Neg Hx   . Thyroid disease Neg Hx     ROS: Review of Systems  Constitutional: Positive for weight loss.  Respiratory: Negative for shortness of breath.   Cardiovascular: Negative for chest pain.    PHYSICAL EXAM: Blood pressure (!) 86/64, pulse 82, temperature 98.3 F (36.8 C), temperature source Oral, height 4\' 11"   (1.499 m), weight 211 lb (95.7 kg), SpO2 94 %. Body mass index is 42.62 kg/m. Physical Exam  Constitutional: She is oriented to person, place, and time. She appears well-developed and well-nourished.  Cardiovascular: Normal rate.  Pulmonary/Chest: Effort normal.  Musculoskeletal: Normal range of motion.  Neurological: She is oriented to person, place, and time.  Skin: Skin is warm and dry.  Psychiatric: She has a normal mood and affect. Her behavior is normal.  Vitals reviewed.   RECENT LABS AND TESTS: BMET    Component Value Date/Time   NA 140 08/07/2017 1116   NA 138 07/01/2015 1420   K 4.6 08/07/2017 1116   K 3.4 (L) 07/01/2015 1420   CL 96 08/07/2017  1116   CO2 27 08/07/2017 1116   CO2 29 07/01/2015 1420   GLUCOSE 97 08/07/2017 1116   GLUCOSE 107 (H) 06/19/2017 0910   GLUCOSE 101 07/01/2015 1420   BUN 18 08/07/2017 1116   BUN 22.2 07/01/2015 1420   CREATININE 1.00 08/07/2017 1116   CREATININE 1.2 (H) 07/01/2015 1420   CALCIUM 9.5 08/07/2017 1116   CALCIUM 9.1 07/01/2015 1420   GFRNONAA 72 08/07/2017 1116   GFRAA 83 08/07/2017 1116   Lab Results  Component Value Date   HGBA1C 5.9 (H) 08/07/2017   HGBA1C 5.6 06/15/2015   HGBA1C 5.7 11/01/2014   Lab Results  Component Value Date   INSULIN 35.3 (H) 08/07/2017   CBC    Component Value Date/Time   WBC 14.6 (H) 08/07/2017 1116   WBC 16.6 (H) 04/08/2017 1350   RBC 5.10 08/07/2017 1116   RBC 4.68 04/08/2017 1350   HGB 15.6 08/07/2017 1116   HGB 13.3 07/12/2015 1307   HCT 44.4 08/07/2017 1116   HCT 40.3 07/12/2015 1307   PLT 389.0 04/08/2017 1350   PLT 460 (H) 07/12/2015 1307   MCV 87 08/07/2017 1116   MCV 88.7 07/12/2015 1307   MCH 30.6 08/07/2017 1116   MCH 29.9 03/16/2016 1451   MCHC 35.1 08/07/2017 1116   MCHC 33.4 04/08/2017 1350   RDW 13.7 08/07/2017 1116   RDW 13.0 07/12/2015 1307   LYMPHSABS 3.8 (H) 08/07/2017 1116   LYMPHSABS 4.1 (H) 07/12/2015 1307   MONOABS 1.1 (H) 10/02/2016 1546   MONOABS  1.4 (H) 07/12/2015 1307   EOSABS 0.3 08/07/2017 1116   BASOSABS 0.1 08/07/2017 1116   BASOSABS 0.1 07/12/2015 1307   Iron/TIBC/Ferritin/ %Sat No results found for: IRON, TIBC, FERRITIN, IRONPCTSAT Lipid Panel     Component Value Date/Time   CHOL 197 08/07/2017 1116   TRIG 122 08/07/2017 1116   HDL 49 08/07/2017 1116   CHOLHDL 4 01/28/2014 1123   VLDL 19.2 01/28/2014 1123   LDLCALC 124 (H) 08/07/2017 1116   Hepatic Function Panel     Component Value Date/Time   PROT 7.8 08/07/2017 1116   PROT 8.2 07/01/2015 1420   ALBUMIN 4.4 08/07/2017 1116   ALBUMIN 3.5 07/01/2015 1420   AST 24 08/07/2017 1116   AST 24 07/01/2015 1420   ALT 21 08/07/2017 1116   ALT 35 07/01/2015 1420   ALKPHOS 129 (H) 08/07/2017 1116   ALKPHOS 109 07/01/2015 1420   BILITOT 0.4 08/07/2017 1116   BILITOT 0.38 07/01/2015 1420   BILIDIR <0.1 (L) 01/05/2015 2226   IBILI NOT CALCULATED 01/05/2015 2226      Component Value Date/Time   TSH 1.24 07/10/2017 0814   TSH 3.41 04/10/2017 1358   TSH 18.07 (H) 10/08/2016 0904    ASSESSMENT AND PLAN: Essential hypertension  Class 3 severe obesity with serious comorbidity and body mass index (BMI) of 40.0 to 44.9 in adult, unspecified obesity type (HCC)  PLAN:  Hypertension We discussed sodium restriction, working on healthy weight loss, and a regular exercise program as the means to achieve improved blood pressure control. Rennis Harding agreed with this plan and agreed to follow up as directed. We will continue to monitor her blood pressure as well as her progress with the above lifestyle modifications. Asimina is to call Cardiologist for further adjustment of blood pressure medication and she will watch for signs of hypotension as she continues her lifestyle modifications. Catherin agrees to follow up with our clinic in 2 weeks.  We spent > than  50% of the 15 minute visit on the counseling as documented in the note.  Obesity Kinslie is currently in the action stage of  change. As such, her goal is to continue with weight loss efforts She has agreed to portion control better and make smarter food choices, such as increase vegetables and decrease simple carbohydrates  Sameera has been instructed to work up to a goal of 150 minutes of combined cardio and strengthening exercise per week for weight loss and overall health benefits. We discussed the following Behavioral Modification Strategies today: travel eating strategies and planning for success   Isabele has agreed to follow up with our clinic in 2 weeks. She was informed of the importance of frequent follow up visits to maximize her success with intensive lifestyle modifications for her multiple health conditions.   OBESITY BEHAVIORAL INTERVENTION VISIT  Today's visit was # 5 out of 22.  Starting weight: 211 lbs Starting date: 08/07/17 Today's weight : 211 lbs Today's date: 10/03/2017 Total lbs lost to date: 0 (Patients must lose 7 lbs in the first 6 months to continue with counseling)   ASK: We discussed the diagnosis of obesity with [name] today and Rennis Harding agreed to give [place] permission to discuss obesity behavioral modification therapy today.  ASSESS: Zenda has the diagnosis of obesity and her BMI today is 42.59 Asherah is in the action stage of change   ADVISE: Cire was educated on the multiple health risks of obesity as well as the benefit of weight loss to improve her health. She was advised of the need for long term treatment and the importance of lifestyle modifications.  AGREE: Multiple dietary modification options and treatment options were discussed and  [place] agreed to the above obesity treatment plan.   Trude Mcburney, am acting as transcriptionist for Illa Level, PA-C I, Illa Level [facility], have reviewed this note and agree with its content

## 2017-10-16 ENCOUNTER — Encounter: Payer: Self-pay | Admitting: Internal Medicine

## 2017-10-16 ENCOUNTER — Ambulatory Visit (INDEPENDENT_AMBULATORY_CARE_PROVIDER_SITE_OTHER): Payer: Commercial Managed Care - PPO | Admitting: Internal Medicine

## 2017-10-16 VITALS — BP 122/78 | HR 73 | Temp 98.3°F | Ht 59.0 in | Wt 215.0 lb

## 2017-10-16 DIAGNOSIS — R05 Cough: Secondary | ICD-10-CM | POA: Diagnosis not present

## 2017-10-16 DIAGNOSIS — R059 Cough, unspecified: Secondary | ICD-10-CM | POA: Insufficient documentation

## 2017-10-16 DIAGNOSIS — R062 Wheezing: Secondary | ICD-10-CM | POA: Diagnosis not present

## 2017-10-16 DIAGNOSIS — I10 Essential (primary) hypertension: Secondary | ICD-10-CM

## 2017-10-16 MED ORDER — PREDNISONE 10 MG PO TABS: ORAL_TABLET | ORAL | 0 refills | Status: DC

## 2017-10-16 MED ORDER — METHYLPREDNISOLONE ACETATE 80 MG/ML IJ SUSP
80.0000 mg | Freq: Once | INTRAMUSCULAR | Status: AC
  Administered 2017-10-16: 80 mg via INTRAMUSCULAR

## 2017-10-16 MED ORDER — HYDROCODONE-HOMATROPINE 5-1.5 MG/5ML PO SYRP: 5.0000 mL | ORAL_SOLUTION | Freq: Four times a day (QID) | ORAL | 0 refills | Status: AC | PRN

## 2017-10-16 MED ORDER — LEVOFLOXACIN 500 MG PO TABS: 500.0000 mg | ORAL_TABLET | Freq: Every day | ORAL | 0 refills | Status: AC

## 2017-10-16 NOTE — Assessment & Plan Note (Signed)
stable overall by history and exam, recent data reviewed with pt, and pt to continue medical treatment as before,  to f/u any worsening symptoms or concerns BP Readings from Last 3 Encounters:  10/16/17 122/78  10/03/17 (!) 86/64  09/19/17 94/67

## 2017-10-16 NOTE — Patient Instructions (Signed)
You had the steroid shot today  .Please take all new medication as prescribed - the antibiotic, cough medicine and prednisone  Please continue all other medications as before, including the symbicort and albuterol as you have  Please have the pharmacy call with any other refills you may need.  Please keep your appointments with your specialists as you may have planned

## 2017-10-16 NOTE — Assessment & Plan Note (Signed)
Mild to mod, for depomedrol IM 80,  predpac asd starting at 60 mg with slower taper, cont inhalers, to f/u any worsening symptoms or concerns

## 2017-10-16 NOTE — Progress Notes (Signed)
Subjective:    Patient ID: Meredith Diaz, female    DOB: 07-03-1980, 37 y.o.   MRN: [id number]  HPI  Here with c/o worsening cough and wheezing; symptoms started about 5 days ago mild, tx with prednisone 40 qd x 4 days (has 1 day left) for possible asthma exac per teledoc, but unfortunately now worsening with low grade temp, hoarseness, HA, sinus pain, ST, scant prod cough, and markedly worsening sob, tightness, doe and just cant catch the breath or breathe deep, despite use of her symbicort and albuterol inhalers with increased freq at home.   Pt denies polydipsia, polyuria  Has hx of respiratory failure related to uncontrolled asthma requiring hospn. Not pregnant, has IUD. 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 Anxiety   . Asthma   . Back pain   . Chronic combined systolic and diastolic CHF (congestive heart failure) (HCC) 07/2011   a. Prior Echo: EF was 25-30%; b. No Sig CAD on CATH; c. No significant finding on Cardiac MRI; d. follow-up Echo 2014 --  EF of 50%;  e. 03/2014 Echo: EF 40-45%, gr 1 DD; f. 11/2014 Echo: EF 40-45%, Gr 2 DD.  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  . Depression   . Dyspnea   . Essential hypertension   . Family history of breast cancer   . Family history of colon cancer   . Fibromyalgia   . Gallbladder problem   . Hypothyroidism   . IBS (irritable bowel syndrome)   . Morbid obesity (HCC)   . Nonischemic cardiomyopathy (HCC)    a. unclear etiology;  b. 01/2012 Cardiac MRI: no infiltrative pathology, EF 52%;  b. 11/2014 Echo: EF 40-45%, gr 2 DD.  [name] Scoliosis   . Sleep apnea   . Stomach problems   . Thyroid disease   . Vitamin B 12 deficiency   . Vitamin D deficiency    Past Surgical History:  Procedure Laterality Date  . CARDIAC  CATHETERIZATION  08/2011   Normal Coronaries - LVEDP 32 mmHg  . Cardiac MRI  April 2013   No suggestion of a congenital abnormality; EF ~ 52% - no regional wall motion abnormalities., no increased myocardial signal intensity. No perfusion abnormality.  . CHOLECYSTECTOMY  2013  . TRANSTHORACIC ECHOCARDIOGRAM  07/2011   Moderate concentric LVH, mildly dilated. EF 25-30% no diastolic dysfunction parameters to suggest elevated LAP  . 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 Normal wall thickness (compared to prior reading of possible apical LVH), grade 2 DD -- with elevated LV filling pressures (however left atrium was read as normal size), normal artery function and size. No no suggestion of pulmonary hypertension    reports that she has never smoked. She has never used smokeless tobacco. She reports that she does not drink alcohol or use drugs. family history includes Alcoholism in her mother; Asthma in her paternal grandmother; Breast cancer (age of onset: 60) in her paternal grandmother; Breast cancer (age of onset: 69) in her maternal aunt and paternal aunt; Colon cancer (age of onset: 24) in her maternal grandmother; Depression in her mother; Heart attack in her  maternal grandfather, maternal grandmother, paternal grandfather, and paternal grandmother; Heart disease in her father, maternal grandfather, and paternal grandmother; Hypertension in her father. Allergies  Allergen Reactions  . Lisinopril Swelling    Angioedema facial, mostly just lips, no trouble breathing   Current Outpatient Medications on File Prior to Visit  Medication Sig Dispense Refill  . budesonide-formoterol (SYMBICORT) 160-4.5 MCG/ACT inhaler INHALE 2 PUFFS INTO THE LUNGS TWICE DAILY 10.2 Inhaler 9  . DULoxetine (CYMBALTA) 60 MG capsule TAKE 1 CAPSULE (60 MG TOTAL) BY MOUTH DAILY. 90  capsule 2  . levothyroxine (SYNTHROID, LEVOTHROID) 125 MCG tablet Take 1 tablet (125 mcg total) by mouth daily. NEED OFFICE VISIT FOR FURTHER REFILLS 90 tablet 0  . metFORMIN (GLUCOPHAGE) 500 MG tablet Take 1 tablet (500 mg total) by mouth daily with breakfast. 30 tablet 0  . metoprolol succinate (TOPROL-XL) 50 MG 24 hr tablet TAKE 1 TAB BY MOUTH DAILY WITH OR IMMEDIATELY FOLLOWING A MEAL NEEDS OFFICE VISIT FOR FURTHER REFILL 30 tablet 11  . spironolactone (ALDACTONE) 25 MG tablet TAKE 1 TABLET EVERY DAY 30 tablet 6  . torsemide (DEMADEX) 20 MG tablet Take 2 tablets (40 mg total) by mouth daily. 60 tablet 11  . valsartan (DIOVAN) 40 MG tablet Take 1 tablet (40 mg total) by mouth every morning AND 2 tablets (80 mg total) every evening. 90 tablet 3  . VENTOLIN HFA 108 (90 Base) MCG/ACT inhaler INHALE 1-2 PUFFS INTO THE LUNGS EVERY 6 (SIX) HOURS AS NEEDED FOR WHEEZING OR SHORTNESS OF BREATH. 18 Inhaler 3  . Vitamin D, Ergocalciferol, (DRISDOL) 50000 units CAPS capsule Take 1 capsule (50,000 Units total) by mouth every 7 (seven) days. 4 capsule 0   No current facility-administered medications on file prior to visit.    Review of Systems  Constitutional: Negative for other unusual diaphoresis or sweats HENT: Negative for ear discharge or swelling Eyes: Negative for other worsening visual disturbances Respiratory: Negative for stridor or other swelling  Gastrointestinal: Negative for worsening distension or other blood Genitourinary: Negative for retention or other urinary change Musculoskeletal: Negative for other MSK pain or swelling Skin: Negative for color change or other new lesions Neurological: Negative for worsening tremors and other numbness  Psychiatric/Behavioral: Negative for worsening agitation or other fatigue All other system neg per pt    Objective:   Physical Exam BP 122/78   Pulse 73   Temp 98.3 F (36.8 C) (Oral)   Ht 4\' 11"  (1.499 m)   Wt 215 lb (97.5 kg)   SpO2 93%    BMI 43.42 kg/m  VS noted, midl to mod ill appearing, morbid obese Constitutional: Pt appears in o/w NAD., not using accessory muscles or tachypnic HENT: Head: NCAT.  Right Ear: External ear normal.  Left Ear: External ear normal.  Bilat tm's with mild erythema.  Max sinus areas mild tender.  Pharynx with mild erythema, no exudate  Eyes: . Pupils are equal, round, and reactive to light. Conjunctivae and EOM are normal Nose: without d/c or deformity Neck: Neck supple. Gross normal ROM Cardiovascular: Normal rate and regular rhythm.   Pulmonary/Chest: Effort normal and breath sounds marked decreased without rales but with moderate bilat wheezing.  Neurological: Pt is alert. At baseline orientation, motor grossly intact Skin: Skin is warm. No rashes, other new lesions, no LE edema Psychiatric: Pt behavior is normal without agitation  No other exam findings       Assessment & Plan:

## 2017-10-16 NOTE — Assessment & Plan Note (Signed)
Mild to mod, ? Viral but cant r/o other, declines cxr, for antibx course, cough med prn,  to f/u any worsening symptoms or concerns

## 2017-10-19 DIAGNOSIS — G4733 Obstructive sleep apnea (adult) (pediatric): Secondary | ICD-10-CM | POA: Diagnosis not present

## 2017-10-22 ENCOUNTER — Ambulatory Visit (INDEPENDENT_AMBULATORY_CARE_PROVIDER_SITE_OTHER): Payer: Commercial Managed Care - PPO | Admitting: Physician Assistant

## 2017-10-22 VITALS — BP 118/86 | HR 88 | Temp 98.2°F | Ht 59.0 in | Wt 212.0 lb

## 2017-10-22 DIAGNOSIS — Z9189 Other specified personal risk factors, not elsewhere classified: Secondary | ICD-10-CM | POA: Diagnosis not present

## 2017-10-22 DIAGNOSIS — E559 Vitamin D deficiency, unspecified: Secondary | ICD-10-CM

## 2017-10-22 DIAGNOSIS — Z8659 Personal history of other mental and behavioral disorders: Secondary | ICD-10-CM | POA: Diagnosis not present

## 2017-10-22 DIAGNOSIS — Z6841 Body Mass Index (BMI) 40.0 and over, adult: Secondary | ICD-10-CM

## 2017-10-22 DIAGNOSIS — R7303 Prediabetes: Secondary | ICD-10-CM

## 2017-10-22 MED ORDER — METFORMIN HCL 500 MG PO TABS: 500.0000 mg | ORAL_TABLET | Freq: Every day | ORAL | 0 refills | Status: DC

## 2017-10-22 MED ORDER — VITAMIN D (ERGOCALCIFEROL) 1.25 MG (50000 UNIT) PO CAPS: 50000.0000 [IU] | ORAL_CAPSULE | ORAL | 0 refills | Status: DC

## 2017-10-22 NOTE — Progress Notes (Signed)
Office: [phone]  /  Fax: [phone]   HPI:   Chief Complaint: OBESITY Meredith Diaz is here to discuss her progress with her obesity treatment plan. She is on the portion control better and make smarter food choices, such as increase vegetables and decrease simple carbohydrates daily and is following her eating plan approximately 50 % of the time. She states she is walking the dog for 10-15 minutes 5-6 times per week. Meredith Diaz was on vacation and found it harder to follow the meal plan. She states due to her history of eating disorder in the past, she is struggling to follow any structured meal plan.  Her weight is 212 lb (96.2 kg) today and has gained 1 pound since her last visit. She has lost 0 lbs since starting treatment with [place].  Vitamin D Deficiency Meredith Diaz has a diagnosis of vitamin D deficiency. She is currently taking prescription Vit D and denies nausea, vomiting or muscle weakness.  At risk for osteopenia and osteoporosis Meredith Diaz is at higher risk of osteopenia and osteoporosis due to vitamin D deficiency.   Pre-Diabetes Meredith Diaz has a diagnosis of pre-iabetes based on her elevated Hgb A1c and was informed this puts her at greater risk of developing diabetes. She is taking metformin currently and continues to work on diet and exercise to decrease risk of diabetes. She denies polyphagia, nausea, or hypoglycemia.  History of Eating Disorder Meredith Diaz admits to history of anorexia in the past and states that she continues to have challenges with eating, as she is always "obsessed" about food. She continues to struggle with following any structured meal plan due to her emotional eating. She follows up with a therapist frequently and states she is on depression and anxiety meds. On review, pt on Cymbalta only.  She denies any depressed mood, or SI.   ALLERGIES: Allergies  Allergen Reactions  . Lisinopril Swelling    Angioedema facial, mostly just lips, no trouble breathing     MEDICATIONS: Current Outpatient Medications on File Prior to Visit  Medication Sig Dispense Refill  . budesonide-formoterol (SYMBICORT) 160-4.5 MCG/ACT inhaler INHALE 2 PUFFS INTO THE LUNGS TWICE DAILY 10.2 Inhaler 9  . DULoxetine (CYMBALTA) 60 MG capsule TAKE 1 CAPSULE (60 MG TOTAL) BY MOUTH DAILY. 90 capsule 2  . HYDROcodone-homatropine (HYCODAN) 5-1.5 MG/5ML syrup Take 5 mLs by mouth every 6 (six) hours as needed for up to 10 days for cough. 180 mL 0  . levofloxacin (LEVAQUIN) 500 MG tablet Take 1 tablet (500 mg total) by mouth daily for 10 days. 10 tablet 0  . levothyroxine (SYNTHROID, LEVOTHROID) 125 MCG tablet Take 1 tablet (125 mcg total) by mouth daily. NEED OFFICE VISIT FOR FURTHER REFILLS 90 tablet 0  . metoprolol succinate (TOPROL-XL) 50 MG 24 hr tablet TAKE 1 TAB BY MOUTH DAILY WITH OR IMMEDIATELY FOLLOWING A MEAL NEEDS OFFICE VISIT FOR FURTHER REFILL 30 tablet 11  . predniSONE (DELTASONE) 10 MG tablet 6tab by mouth x3days,4tab x 3 day,3tab x3 day,2tab x 3day,1tab x 3day 48 tablet 0  . spironolactone (ALDACTONE) 25 MG tablet TAKE 1 TABLET EVERY DAY 30 tablet 6  . torsemide (DEMADEX) 20 MG tablet Take 2 tablets (40 mg total) by mouth daily. 60 tablet 11  . valsartan (DIOVAN) 40 MG tablet Take 1 tablet (40 mg total) by mouth every morning AND 2 tablets (80 mg total) every evening. 90 tablet 3  . VENTOLIN HFA 108 (90 Base) MCG/ACT inhaler INHALE 1-2 PUFFS INTO THE LUNGS EVERY 6 (SIX) HOURS AS  NEEDED FOR WHEEZING OR SHORTNESS OF BREATH. 18 Inhaler 3   No current facility-administered medications on file prior to visit.     PAST MEDICAL HISTORY: Past Medical History:  Diagnosis Date  . Anxiety   . Asthma   . Back pain   . Chronic combined systolic and diastolic CHF (congestive heart failure) (HCC) 07/2011   a. Prior Echo: EF was 25-30%; b. No Sig CAD on CATH; c. No significant finding on Cardiac MRI; d. follow-up Echo 2014 --  EF of 50%;  e. 03/2014 Echo: EF 40-45%, gr 1 DD; f.  11/2014 Echo: EF 40-45%, Gr 2 DD.  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  . Depression   . Dyspnea   . Essential hypertension   . Family history of breast cancer   . Family history of colon cancer   . Fibromyalgia   . Gallbladder problem   . Hypothyroidism   . IBS (irritable bowel syndrome)   . Morbid obesity (HCC)   . Nonischemic cardiomyopathy (HCC)    a. unclear etiology;  b. 01/2012 Cardiac MRI: no infiltrative pathology, EF 52%;  b. 11/2014 Echo: EF 40-45%, gr 2 DD.  [name] Scoliosis   . Sleep apnea   . Stomach problems   . Thyroid disease   . Vitamin B 12 deficiency   . Vitamin D deficiency     PAST SURGICAL HISTORY: Past Surgical History:  Procedure Laterality Date  . CARDIAC CATHETERIZATION  08/2011   Normal Coronaries - LVEDP 32 mmHg  . Cardiac MRI  April 2013   No suggestion of a congenital abnormality; EF ~ 52% - no regional wall motion abnormalities., no increased myocardial signal intensity. No perfusion abnormality.  . CHOLECYSTECTOMY  2013  . TRANSTHORACIC ECHOCARDIOGRAM  07/2011   Moderate concentric LVH, mildly dilated. EF 25-30% no diastolic dysfunction parameters to suggest elevated LAP  . 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 Normal wall thickness (compared to prior reading of possible apical LVH), grade 2 DD -- with elevated LV filling pressures (however left atrium was read as normal size), normal artery function and size. No no suggestion of pulmonary hypertension    SOCIAL HISTORY: Social History   Tobacco Use  . Smoking status: Never Smoker  . Smokeless tobacco: Never Used  Substance Use Topics  . Alcohol use: No  . Drug use: No    FAMILY HISTORY: Family  History  Problem Relation Age of Onset  . Depression Mother   . Alcoholism Mother   . Hypertension Father   . Heart disease Father   . Colon cancer Maternal Grandmother 83  . Heart attack Maternal Grandmother   . Heart disease Maternal Grandfather   . Heart attack Maternal Grandfather   . Heart disease Paternal Grandmother   . Breast cancer Paternal Grandmother 63       possible bilateral cancer  . Asthma Paternal Grandmother   . Heart attack Paternal Grandmother   . Heart attack Paternal Grandfather   . Breast cancer Maternal Aunt 50  . Breast cancer Paternal Aunt 83  . Stroke Neg Hx   . Thyroid disease Neg Hx     ROS: Review of Systems  Constitutional: Negative for weight  loss.  Gastrointestinal: Negative for nausea and vomiting.  Musculoskeletal:       Negative muscle weakness  Endo/Heme/Allergies:       Negative polyphagia Negative hypoglycemia  Psychiatric/Behavioral: Negative for depression and suicidal ideas.    PHYSICAL EXAM: Blood pressure 118/86, pulse 88, temperature 98.2 F (36.8 C), height 4\' 11"  (1.499 m), weight 212 lb (96.2 kg), SpO2 95 %. Body mass index is 42.82 kg/m. Physical Exam  Constitutional: She is oriented to person, place, and time. She appears well-developed and well-nourished.  Cardiovascular: Normal rate.  Pulmonary/Chest: Effort normal.  Musculoskeletal: Normal range of motion.  Neurological: She is oriented to person, place, and time.  Skin: Skin is warm and dry.  Psychiatric: She has a normal mood and affect. Her behavior is normal.  Vitals reviewed.   RECENT LABS AND TESTS: BMET    Component Value Date/Time   NA 140 08/07/2017 1116   NA 138 07/01/2015 1420   K 4.6 08/07/2017 1116   K 3.4 (L) 07/01/2015 1420   CL 96 08/07/2017 1116   CO2 27 08/07/2017 1116   CO2 29 07/01/2015 1420   GLUCOSE 97 08/07/2017 1116   GLUCOSE 107 (H) 06/19/2017 0910   GLUCOSE 101 07/01/2015 1420   BUN 18 08/07/2017 1116   BUN 22.2 07/01/2015  1420   CREATININE 1.00 08/07/2017 1116   CREATININE 1.2 (H) 07/01/2015 1420   CALCIUM 9.5 08/07/2017 1116   CALCIUM 9.1 07/01/2015 1420   GFRNONAA 72 08/07/2017 1116   GFRAA 83 08/07/2017 1116   Lab Results  Component Value Date   HGBA1C 5.9 (H) 08/07/2017   HGBA1C 5.6 06/15/2015   HGBA1C 5.7 11/01/2014   Lab Results  Component Value Date   INSULIN 35.3 (H) 08/07/2017   CBC    Component Value Date/Time   WBC 14.6 (H) 08/07/2017 1116   WBC 16.6 (H) 04/08/2017 1350   RBC 5.10 08/07/2017 1116   RBC 4.68 04/08/2017 1350   HGB 15.6 08/07/2017 1116   HGB 13.3 07/12/2015 1307   HCT 44.4 08/07/2017 1116   HCT 40.3 07/12/2015 1307   PLT 389.0 04/08/2017 1350   PLT 460 (H) 07/12/2015 1307   MCV 87 08/07/2017 1116   MCV 88.7 07/12/2015 1307   MCH 30.6 08/07/2017 1116   MCH 29.9 03/16/2016 1451   MCHC 35.1 08/07/2017 1116   MCHC 33.4 04/08/2017 1350   RDW 13.7 08/07/2017 1116   RDW 13.0 07/12/2015 1307   LYMPHSABS 3.8 (H) 08/07/2017 1116   LYMPHSABS 4.1 (H) 07/12/2015 1307   MONOABS 1.1 (H) 10/02/2016 1546   MONOABS 1.4 (H) 07/12/2015 1307   EOSABS 0.3 08/07/2017 1116   BASOSABS 0.1 08/07/2017 1116   BASOSABS 0.1 07/12/2015 1307   Iron/TIBC/Ferritin/ %Sat No results found for: IRON, TIBC, FERRITIN, IRONPCTSAT Lipid Panel     Component Value Date/Time   CHOL 197 08/07/2017 1116   TRIG 122 08/07/2017 1116   HDL 49 08/07/2017 1116   CHOLHDL 4 01/28/2014 1123   VLDL 19.2 01/28/2014 1123   LDLCALC 124 (H) 08/07/2017 1116   Hepatic Function Panel     Component Value Date/Time   PROT 7.8 08/07/2017 1116   PROT 8.2 07/01/2015 1420   ALBUMIN 4.4 08/07/2017 1116   ALBUMIN 3.5 07/01/2015 1420   AST 24 08/07/2017 1116   AST 24 07/01/2015 1420   ALT 21 08/07/2017 1116   ALT 35 07/01/2015 1420   ALKPHOS 129 (H) 08/07/2017 1116   ALKPHOS 109 07/01/2015 1420   BILITOT  0.4 08/07/2017 1116   BILITOT 0.38 07/01/2015 1420   BILIDIR <0.1 (L) 01/05/2015 2226   IBILI NOT  CALCULATED 01/05/2015 2226      Component Value Date/Time   TSH 1.24 07/10/2017 0814   TSH 3.41 04/10/2017 1358   TSH 18.07 (H) 10/08/2016 0904  Results for EVANGELYNN, LOCHRIDGE (MRN [id number]) as of 10/22/2017 15:51  Ref. Range 08/07/2017 11:16  Vitamin D, 25-Hydroxy Latest Ref Range: 30.0 - 100.0 ng/mL 9.7 (L)    ASSESSMENT AND PLAN: Vitamin D deficiency - Plan: Vitamin D, Ergocalciferol, (DRISDOL) 50000 units CAPS capsule  Pre-diabetes - Plan: metFORMIN (GLUCOPHAGE) 500 MG tablet  History of eating disorder  At risk for osteoporosis  Class 3 severe obesity with serious comorbidity and body mass index (BMI) of 40.0 to 44.9 in adult, unspecified obesity type (HCC)  PLAN:  Vitamin D Deficiency Abella was informed that low vitamin D levels contributes to fatigue and are associated with obesity, breast, and colon cancer. Janani agrees to continue taking prescription Vit D @50 ,000 IU every week #4 and we will refill for 1 month. She will follow up for routine testing of vitamin D, at least 2-3 times per year. She was informed of the risk of over-replacement of vitamin D and agrees to not increase her dose unless she discusses this with [place] first. Aeriana agrees to follow up with our clinic in 2 weeks.   At risk for osteopenia and osteoporosis Meredith Diaz is at risk for osteopenia and osteoporsis due to her vitamin D deficiency. She was encouraged to take her vitamin D and follow her higher calcium diet and increase strengthening exercise to help strengthen her bones and decrease her risk of osteopenia and osteoporosis.  Pre-Diabetes Meredith Diaz will continue to work on weight loss, exercise, and decreasing simple carbohydrates in her diet to help decrease the risk of diabetes. We dicussed metformin including benefits and risks. She was informed that eating too many simple carbohydrates or too many calories at one sitting increases the likelihood of GI side effects. Meredith Diaz agrees to continue taking metformin 500  mg q AM #30 and we will refill for 1 month. Meredith Diaz agrees to follow up with our clinic in 2 weeks as directed to monitor her progress.  History of Eating Disorder Meredith Diaz will continue to follow up with therapist as scheduled. She is advised to discuss her eating disorder and the need for any further treatment with her therapist.  Meredith Diaz agrees to follow up with our clinic in 2 weeks.   Obesity Meredith Diaz is currently in the action stage of change. As such, her goal is to continue with weight loss efforts She has agreed to portion control better and make smarter food choices, such as increase vegetables and decrease simple carbohydrates  Meredith Diaz has been instructed to work up to a goal of 150 minutes of combined cardio and strengthening exercise per week for weight loss and overall health benefits. We discussed the following Behavioral Modification Strategies today: emotional eating strategies and planning for success   Meredith Diaz has agreed to follow up with our clinic in 2 weeks. She was informed of the importance of frequent follow up visits to maximize her success with intensive lifestyle modifications for her multiple health conditions.   OBESITY BEHAVIORAL INTERVENTION VISIT  Today's visit was # 6 out of 22.  Starting weight: 211 lbs Starting date: 08/07/17 Today's weight : 212 lbs  Today's date: 10/22/2017 Total lbs lost to date: 0 (Patients must lose 7 lbs in the first  6 months to continue with counseling)   ASK: We discussed the diagnosis of obesity with Meredith Diaz today and Meredith Diaz agreed to give [place] permission to discuss obesity behavioral modification therapy today.  ASSESS: Meredith Diaz has the diagnosis of obesity and her BMI today is 42.8 Meredith Diaz is in the action stage of change   ADVISE: Meredith Diaz was educated on the multiple health risks of obesity as well as the benefit of weight loss to improve her health. She was advised of the need for long term treatment and the importance of lifestyle  modifications.  AGREE: Multiple dietary modification options and treatment options were discussed and  Meredith Diaz agreed to the above obesity treatment plan.   Trude Mcburney, am acting as transcriptionist for Illa Level, PA-C I, Illa Level [facility], have reviewed this note and agree with its content

## 2017-11-05 ENCOUNTER — Telehealth (HOSPITAL_COMMUNITY): Payer: Self-pay

## 2017-11-05 ENCOUNTER — Ambulatory Visit (INDEPENDENT_AMBULATORY_CARE_PROVIDER_SITE_OTHER): Payer: Commercial Managed Care - PPO | Admitting: Physician Assistant

## 2017-11-05 VITALS — BP 91/56 | HR 79 | Temp 98.2°F | Ht 59.0 in | Wt 213.0 lb

## 2017-11-05 DIAGNOSIS — I959 Hypotension, unspecified: Secondary | ICD-10-CM | POA: Diagnosis not present

## 2017-11-05 DIAGNOSIS — R0602 Shortness of breath: Secondary | ICD-10-CM | POA: Diagnosis not present

## 2017-11-05 DIAGNOSIS — Z9189 Other specified personal risk factors, not elsewhere classified: Secondary | ICD-10-CM

## 2017-11-05 DIAGNOSIS — Z6841 Body Mass Index (BMI) 40.0 and over, adult: Secondary | ICD-10-CM

## 2017-11-05 NOTE — Progress Notes (Signed)
Office: [phone]  /  Fax: [phone]   HPI:   Chief Complaint: OBESITY Meredith Diaz is here to discuss her progress with her obesity treatment plan. She is on the portion control better and make smarter food choices, such as increase vegetables and decrease simple carbohydrates and is following her eating plan approximately 25 % of the time. She states she is exercising 10-20 minutes 7 times per week. Meredith Diaz is retaining fluids. She states she feels "mentally better" about portion control and smarter food choices and wants to stay on that versus structured meal plan or journaling.  Her weight is 213 lb (96.6 kg) today and has gained 1 pound since her last visit. She has lost 0 lbs since starting treatment with [place].  Shortness of Breath Meredith Diaz complains of dyspnea for the past 2 days. Has been sick with bronchitis recently but states that has resolved. Also, has 2+ bilateral pitting edema. She has been taking diuretic as prescribed.  Hypotension Meredith Diaz's blood pressure is low. She is a 37 y.o. female with a history of congestive heart failure, on Arb, beta blockers, and diuretics; last visit Arb was decreased by Cardiologist due to hypotension. Perline denies chest pain. She is working weight loss to help control her blood pressure with the goal of decreasing her risk of heart attack and stroke. Meredith Diaz's blood pressure is not currently controlled.  At risk for cardiovascular disease Meredith Diaz is at a higher than average risk for cardiovascular disease due to obesity and hypotension. She currently denies any chest pain.  ALLERGIES: Allergies  Allergen Reactions  . Lisinopril Swelling    Angioedema facial, mostly just lips, no trouble breathing    MEDICATIONS: Current Outpatient Medications on File Prior to Visit  Medication Sig Dispense Refill  . budesonide-formoterol (SYMBICORT) 160-4.5 MCG/ACT inhaler INHALE 2 PUFFS INTO THE LUNGS TWICE DAILY 10.2 Inhaler 9  . DULoxetine (CYMBALTA) 60 MG  capsule TAKE 1 CAPSULE (60 MG TOTAL) BY MOUTH DAILY. 90 capsule 2  . levothyroxine (SYNTHROID, LEVOTHROID) 125 MCG tablet Take 1 tablet (125 mcg total) by mouth daily. NEED OFFICE VISIT FOR FURTHER REFILLS 90 tablet 0  . metFORMIN (GLUCOPHAGE) 500 MG tablet Take 1 tablet (500 mg total) by mouth daily with breakfast. 30 tablet 0  . metoprolol succinate (TOPROL-XL) 50 MG 24 hr tablet TAKE 1 TAB BY MOUTH DAILY WITH OR IMMEDIATELY FOLLOWING A MEAL NEEDS OFFICE VISIT FOR FURTHER REFILL 30 tablet 11  . spironolactone (ALDACTONE) 25 MG tablet TAKE 1 TABLET EVERY DAY 30 tablet 6  . torsemide (DEMADEX) 20 MG tablet Take 2 tablets (40 mg total) by mouth daily. 60 tablet 11  . valsartan (DIOVAN) 40 MG tablet Take 1 tablet (40 mg total) by mouth every morning AND 2 tablets (80 mg total) every evening. 90 tablet 3  . VENTOLIN HFA 108 (90 Base) MCG/ACT inhaler INHALE 1-2 PUFFS INTO THE LUNGS EVERY 6 (SIX) HOURS AS NEEDED FOR WHEEZING OR SHORTNESS OF BREATH. 18 Inhaler 3  . Vitamin D, Ergocalciferol, (DRISDOL) 50000 units CAPS capsule Take 1 capsule (50,000 Units total) by mouth every 7 (seven) days. 4 capsule 0  . predniSONE (DELTASONE) 10 MG tablet 6tab by mouth x3days,4tab x 3 day,3tab x3 day,2tab x 3day,1tab x 3day (Patient not taking: Reported on 11/05/2017) 48 tablet 0   No current facility-administered medications on file prior to visit.     PAST MEDICAL HISTORY: Past Medical History:  Diagnosis Date  . Anxiety   . Asthma   . Back pain   .  Chronic combined systolic and diastolic CHF (congestive heart failure) (HCC) 07/2011   a. Prior Echo: EF was 25-30%; b. No Sig CAD on CATH; c. No significant finding on Cardiac MRI; d. follow-up Echo 2014 --  EF of 50%;  e. 03/2014 Echo: EF 40-45%, gr 1 DD; f. 11/2014 Echo: EF 40-45%, Gr 2 DD.  [name] Congenital heart disease, adult    a. 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  . Depression   . Dyspnea   . Essential hypertension   . Family history of breast cancer   . Family history of colon cancer   . Fibromyalgia   . Gallbladder problem   . Hypothyroidism   . IBS (irritable bowel syndrome)   . Morbid obesity (HCC)   . Nonischemic cardiomyopathy (HCC)    a. unclear etiology;  b. 01/2012 Cardiac MRI: no infiltrative pathology, EF 52%;  b. 11/2014 Echo: EF 40-45%, gr 2 DD.  [name] Scoliosis   . Sleep apnea   . Stomach problems   . Thyroid disease   . Vitamin B 12 deficiency   . Vitamin D deficiency     PAST SURGICAL HISTORY: Past Surgical History:  Procedure Laterality Date  . CARDIAC CATHETERIZATION  08/2011   Normal Coronaries - LVEDP 32 mmHg  . Cardiac MRI  April 2013   No suggestion of a congenital abnormality; EF ~ 52% - no regional wall motion abnormalities., no increased myocardial signal intensity. No perfusion abnormality.  . CHOLECYSTECTOMY  2013  . TRANSTHORACIC ECHOCARDIOGRAM  07/2011   Moderate concentric LVH, mildly dilated. EF 25-30% no diastolic dysfunction parameters to suggest elevated LAP  . 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 Normal wall thickness (compared to prior reading of possible apical LVH), grade 2 DD -- with elevated LV filling pressures (however left atrium was read as normal size), normal artery function and size. No no suggestion of pulmonary hypertension    SOCIAL HISTORY: Social History   Tobacco Use  . Smoking status: Never Smoker  . Smokeless tobacco: Never Used  Substance Use Topics  . Alcohol use: No  . Drug use: No    FAMILY HISTORY: Family History  Problem Relation Age of Onset  . Depression Mother   . Alcoholism Mother   . Hypertension Father   . Heart disease Father   . Colon cancer Maternal Grandmother 36  . Heart attack Maternal  Grandmother   . Heart disease Maternal Grandfather   . Heart attack Maternal Grandfather   . Heart disease Paternal Grandmother   . Breast cancer Paternal Grandmother 48       possible bilateral cancer  . Asthma Paternal Grandmother   . Heart attack Paternal Grandmother   . Heart attack Paternal Grandfather   . Breast cancer Maternal Aunt 50  . Breast cancer Paternal Aunt 42  . Stroke Neg Hx   . Thyroid disease Neg Hx     ROS: Review of Systems  Constitutional: Negative for weight loss.  Respiratory: Positive for shortness of breath.   Cardiovascular: Negative for chest pain.    PHYSICAL EXAM: Blood pressure (!) 91/56, pulse 79, temperature 98.2 F (36.8 C), temperature source Oral, height 4\' 11"  (1.499 m), weight 213 lb (96.6 kg), SpO2 97 %. Body mass  index is 43.02 kg/m. Physical Exam  Constitutional: She is oriented to person, place, and time. She appears well-developed and well-nourished.  Cardiovascular: Normal rate.  Pulmonary/Chest: Effort normal.  Musculoskeletal: Normal range of motion. She exhibits edema (2+ bilateral pitting).  Neurological: She is oriented to person, place, and time.  Skin: Skin is warm and dry.  Psychiatric: She has a normal mood and affect. Her behavior is normal.  Vitals reviewed.   RECENT LABS AND TESTS: BMET    Component Value Date/Time   NA 140 08/07/2017 1116   NA 138 07/01/2015 1420   K 4.6 08/07/2017 1116   K 3.4 (L) 07/01/2015 1420   CL 96 08/07/2017 1116   CO2 27 08/07/2017 1116   CO2 29 07/01/2015 1420   GLUCOSE 97 08/07/2017 1116   GLUCOSE 107 (H) 06/19/2017 0910   GLUCOSE 101 07/01/2015 1420   BUN 18 08/07/2017 1116   BUN 22.2 07/01/2015 1420   CREATININE 1.00 08/07/2017 1116   CREATININE 1.2 (H) 07/01/2015 1420   CALCIUM 9.5 08/07/2017 1116   CALCIUM 9.1 07/01/2015 1420   GFRNONAA 72 08/07/2017 1116   GFRAA 83 08/07/2017 1116   Lab Results  Component Value Date   HGBA1C 5.9 (H) 08/07/2017   HGBA1C 5.6  06/15/2015   HGBA1C 5.7 11/01/2014   Lab Results  Component Value Date   INSULIN 35.3 (H) 08/07/2017   CBC    Component Value Date/Time   WBC 14.6 (H) 08/07/2017 1116   WBC 16.6 (H) 04/08/2017 1350   RBC 5.10 08/07/2017 1116   RBC 4.68 04/08/2017 1350   HGB 15.6 08/07/2017 1116   HGB 13.3 07/12/2015 1307   HCT 44.4 08/07/2017 1116   HCT 40.3 07/12/2015 1307   PLT 389.0 04/08/2017 1350   PLT 460 (H) 07/12/2015 1307   MCV 87 08/07/2017 1116   MCV 88.7 07/12/2015 1307   MCH 30.6 08/07/2017 1116   MCH 29.9 03/16/2016 1451   MCHC 35.1 08/07/2017 1116   MCHC 33.4 04/08/2017 1350   RDW 13.7 08/07/2017 1116   RDW 13.0 07/12/2015 1307   LYMPHSABS 3.8 (H) 08/07/2017 1116   LYMPHSABS 4.1 (H) 07/12/2015 1307   MONOABS 1.1 (H) 10/02/2016 1546   MONOABS 1.4 (H) 07/12/2015 1307   EOSABS 0.3 08/07/2017 1116   BASOSABS 0.1 08/07/2017 1116   BASOSABS 0.1 07/12/2015 1307   Iron/TIBC/Ferritin/ %Sat No results found for: IRON, TIBC, FERRITIN, IRONPCTSAT Lipid Panel     Component Value Date/Time   CHOL 197 08/07/2017 1116   TRIG 122 08/07/2017 1116   HDL 49 08/07/2017 1116   CHOLHDL 4 01/28/2014 1123   VLDL 19.2 01/28/2014 1123   LDLCALC 124 (H) 08/07/2017 1116   Hepatic Function Panel     Component Value Date/Time   PROT 7.8 08/07/2017 1116   PROT 8.2 07/01/2015 1420   ALBUMIN 4.4 08/07/2017 1116   ALBUMIN 3.5 07/01/2015 1420   AST 24 08/07/2017 1116   AST 24 07/01/2015 1420   ALT 21 08/07/2017 1116   ALT 35 07/01/2015 1420   ALKPHOS 129 (H) 08/07/2017 1116   ALKPHOS 109 07/01/2015 1420   BILITOT 0.4 08/07/2017 1116   BILITOT 0.38 07/01/2015 1420   BILIDIR <0.1 (L) 01/05/2015 2226   IBILI NOT CALCULATED 01/05/2015 2226      Component Value Date/Time   TSH 1.24 07/10/2017 0814   TSH 3.41 04/10/2017 1358   TSH 18.07 (H) 10/08/2016 0904    ASSESSMENT AND PLAN: Shortness of breath  Hypotension, unspecified hypotension  type  At risk for heart disease  Class 3  severe obesity with serious comorbidity and body mass index (BMI) of 40.0 to 44.9 in adult, unspecified obesity type [facility])  PLAN:  Shortness of Breath Meredith Diaz is to follow up with her Cardiologist or primary care physician today, if unable to go to the emergency department. Meredith Diaz agrees to follow up with our clinic in 2 weeks.  Hypotension Meredith Diaz was encouraged to follow up with her Cardiologist or primary care physician today, if unable to go to the emergency department. We will continue to monitor her blood pressure as well as her progress with the lifestyle modifications. She will watch for signs of hypotension as she continues her lifestyle modifications. Meredith Diaz agrees to follow up with our clinic in 2 weeks.  Cardiovascular risk counselling Meredith Diaz was given extended (15 minutes) coronary artery disease prevention counseling today. She is 37 y.o. female and has risk factors for heart disease including obesity and hypotension. We discussed intensive lifestyle modifications today with an emphasis on specific weight loss instructions and strategies. Pt was also informed of the importance of increasing exercise and decreasing saturated fats to help prevent heart disease.  Obesity Meredith Diaz is currently in the action stage of change. As such, her goal is to continue with weight loss efforts She has agreed to portion control better and make smarter food choices, such as increase vegetables and decrease simple carbohydrates  Meredith Diaz has been instructed to work up to a goal of 150 minutes of combined cardio and strengthening exercise per week for weight loss and overall health benefits. We discussed the following Behavioral Modification Strategies today: increasing lean protein intake and work on meal planning and easy cooking plans   Meredith Diaz has agreed to follow up with our clinic in 2 weeks. She was informed of the importance of frequent follow up visits to maximize her success with intensive lifestyle  modifications for her multiple health conditions.   OBESITY BEHAVIORAL INTERVENTION VISIT  Today's visit was # 7 out of 22.  Starting weight: 211 lbs Starting date: 08/07/17 Today's weight : 213 lbs Today's date: 11/05/2017 Total lbs lost to date: 0 (Patients must lose 7 lbs in the first 6 months to continue with counseling)   ASK: We discussed the diagnosis of obesity with Meredith Diaz today and Meredith Diaz agreed to give [place] permission to discuss obesity behavioral modification therapy today.  ASSESS: Meredith Diaz has the diagnosis of obesity and her BMI today is 43 Meredith Diaz is in the action stage of change   ADVISE: Meredith Diaz was educated on the multiple health risks of obesity as well as the benefit of weight loss to improve her health. She was advised of the need for long term treatment and the importance of lifestyle modifications.  AGREE: Multiple dietary modification options and treatment options were discussed and  Meredith Diaz agreed to the above obesity treatment plan.   Trude Mcburney, am acting as transcriptionist for Illa Level, PA-C I, Illa Level [facility], have reviewed this note and agree with its content

## 2017-11-05 NOTE — Telephone Encounter (Signed)
Pt called spoke with [facility] CMA. Pt reportedly had some swelling, but does not want to adjust meds over the phone, pt does not want to be seen by [facility] clinic. I called pt to get more information about s/s. Called pt unable to reach and VM full.

## 2017-11-12 ENCOUNTER — Ambulatory Visit (INDEPENDENT_AMBULATORY_CARE_PROVIDER_SITE_OTHER): Payer: Commercial Managed Care - PPO | Admitting: Internal Medicine

## 2017-11-12 ENCOUNTER — Encounter: Payer: Self-pay | Admitting: Internal Medicine

## 2017-11-12 ENCOUNTER — Ambulatory Visit (INDEPENDENT_AMBULATORY_CARE_PROVIDER_SITE_OTHER)
Admission: RE | Admit: 2017-11-12 | Discharge: 2017-11-12 | Disposition: A | Discharge status: Home / Self Care | Payer: Commercial Managed Care - PPO | Source: Ambulatory Visit | Attending: Internal Medicine | Admitting: Internal Medicine

## 2017-11-12 ENCOUNTER — Other Ambulatory Visit (INDEPENDENT_AMBULATORY_CARE_PROVIDER_SITE_OTHER): Payer: Commercial Managed Care - PPO

## 2017-11-12 VITALS — BP 100/60 | HR 78 | Temp 97.8°F | Ht 59.0 in | Wt 222.0 lb

## 2017-11-12 DIAGNOSIS — I429 Cardiomyopathy, unspecified: Secondary | ICD-10-CM

## 2017-11-12 DIAGNOSIS — R0602 Shortness of breath: Secondary | ICD-10-CM

## 2017-11-12 DIAGNOSIS — R42 Dizziness and giddiness: Secondary | ICD-10-CM | POA: Diagnosis not present

## 2017-11-12 LAB — CBC
HCT: 43.1 % (ref 36.0–46.0)
HEMOGLOBIN: 14.2 g/dL (ref 12.0–15.0)
MCHC: 33 g/dL (ref 30.0–36.0)
MCV: 90.9 fl (ref 78.0–100.0)
Platelets: 294 10*3/uL (ref 150.0–400.0)
RBC: 4.74 Mil/uL (ref 3.87–5.11)
RDW: 14.2 % (ref 11.5–15.5)
WBC: 11.9 10*3/uL — ABNORMAL HIGH (ref 4.0–10.5)

## 2017-11-12 LAB — COMPREHENSIVE METABOLIC PANEL
ALBUMIN: 4.2 g/dL (ref 3.5–5.2)
ALK PHOS: 110 U/L (ref 39–117)
ALT: 30 U/L (ref 0–35)
AST: 17 U/L (ref 0–37)
BILIRUBIN TOTAL: 0.4 mg/dL (ref 0.2–1.2)
BUN: 17 mg/dL (ref 6–23)
CALCIUM: 9.1 mg/dL (ref 8.4–10.5)
CO2: 39 mEq/L — ABNORMAL HIGH (ref 19–32)
Chloride: 96 mEq/L (ref 96–112)
Creatinine, Ser: 0.84 mg/dL (ref 0.40–1.20)
GFR: 80.75 mL/min (ref >60.00)
Glucose, Bld: 105 mg/dL — ABNORMAL HIGH (ref 70–99)
POTASSIUM: 4 meq/L (ref 3.5–5.1)
Sodium: 142 mEq/L (ref 135–145)
Total Protein: 7.8 g/dL (ref 6.0–8.3)

## 2017-11-12 LAB — BRAIN NATRIURETIC PEPTIDE: PRO B NATRI PEPTIDE: 118 pg/mL — AB (ref 0.0–100.0)

## 2017-11-12 NOTE — Progress Notes (Signed)
   Subjective:    Patient ID: Meredith Diaz, female    DOB: [birth year], 37 y.o.   MRN: [id number]  HPI The patient is a 37 YO female coming in for several concerns including SOB (she is getting out of breath with short distances, is not sure she wants to go back to cardiopulmonary rehab as this caused her to be depressed, she is up weight and feels like more fluid, had bronchitis about 1 month ago and did steroids and antibiotics and felt dramatically better after that, coughing no sputum mild now, denies fevers or chills) weight gain (up about 10 pounds per our scale in the last week, at least 6 pounds at home, has been working on weight loss with nutritional clinic and does not feel like she is making progress, she feels a lot of her problems are related to weight and this frustrates her a lot) and lightheadedness (BP running low, no changes to medications recently, she has added more valsartan per cardiology, she has been lightheaded in the last 2-3 days, BP 80/50s at recent visit, she is cutting back on diet and has eliminates sodas in the last several months, is very conscious about salt intake due to heart failure). She is overall not feeling well.   Review of Systems  Constitutional: Positive for activity change, appetite change, fatigue and unexpected weight change.  HENT: Negative.   Eyes: Negative.   Respiratory: Positive for shortness of breath. Negative for cough and chest tightness.   Cardiovascular: Positive for leg swelling. Negative for chest pain and palpitations.  Gastrointestinal: Positive for abdominal distention. Negative for abdominal pain, constipation, diarrhea, nausea and vomiting.  Musculoskeletal: Negative.   Skin: Negative.   Neurological: Positive for dizziness and light-headedness. Negative for tremors, seizures, syncope and weakness.  Psychiatric/Behavioral: Positive for decreased concentration and dysphoric mood. Negative for agitation, behavioral problems,  self-injury, sleep disturbance and suicidal ideas. The patient is not nervous/anxious.       Objective:   Physical Exam  Constitutional: She is oriented to person, place, and time. She appears well-developed and well-nourished.  HENT:  Head: Normocephalic and atraumatic.  Eyes: EOM are normal.  Neck: Normal range of motion.  Cardiovascular: Normal rate and regular rhythm.  Pulmonary/Chest: Effort normal. No respiratory distress. She has no wheezes. She has no rales.  Possible rales in the left base  Abdominal: Soft. Bowel sounds are normal. She exhibits no distension. There is no tenderness. There is no rebound.  Musculoskeletal: She exhibits edema.  Neurological: She is alert and oriented to person, place, and time. Coordination normal.  Skin: Skin is warm and dry.  Psychiatric:  Mood appropriate but frustrated and downtrodden   Vitals:   11/12/17 1041  BP: 100/60  Pulse: 78  Temp: 97.8 F (36.6 C)  TempSrc: Oral  SpO2: 97%  Weight: 222 lb (100.7 kg)  Height: 4\' 11"  (1.499 m)      Assessment & Plan:

## 2017-11-12 NOTE — Patient Instructions (Addendum)
We will have you drop the valsartan to 1 pill daily.   We will have you double the torsemide to 4 pills daily for the next 1-2 weeks.   We are checking labs and chest x-ray today.  Bernie Covey is the injection, belviq, contrave and qsymia are pills to help with weight.

## 2017-11-12 NOTE — Assessment & Plan Note (Signed)
Checking CXR, CMP, CBC, BNP today. Increase torsemide to 4 pills daily and keep spironolactone the same. Weight is up dramatically from last week which is likely fluid.

## 2017-11-12 NOTE — Assessment & Plan Note (Signed)
Asked her to cut back valsartan to 1 pill daily (40 mg) and increase torsemide to 4 pills (80 mg) daily for the next 1-2 weeks for weight gain. She normally takes spironolactone and torsemide for weight maintenance. She may be a candidate for entresto and she will get back in with her heart failure clinic. She is not functioning well lately and this is depressing to her and limiting QOL.

## 2017-11-12 NOTE — Assessment & Plan Note (Signed)
Will decrease valsartan and increase torsemide as she needs more diuresis and needs temporary decrease in valsartan given low BP.

## 2017-11-13 NOTE — Telephone Encounter (Signed)
Called again no answer. VM full.

## 2017-11-18 DIAGNOSIS — G4733 Obstructive sleep apnea (adult) (pediatric): Secondary | ICD-10-CM | POA: Diagnosis not present

## 2017-11-25 ENCOUNTER — Ambulatory Visit (INDEPENDENT_AMBULATORY_CARE_PROVIDER_SITE_OTHER): Payer: Commercial Managed Care - PPO | Admitting: Physician Assistant

## 2017-11-26 ENCOUNTER — Ambulatory Visit (INDEPENDENT_AMBULATORY_CARE_PROVIDER_SITE_OTHER): Payer: Commercial Managed Care - PPO | Admitting: Family Medicine

## 2017-11-26 VITALS — BP 106/74 | HR 81 | Temp 97.8°F | Ht 59.0 in | Wt 216.0 lb

## 2017-11-26 DIAGNOSIS — Z6841 Body Mass Index (BMI) 40.0 and over, adult: Secondary | ICD-10-CM

## 2017-11-26 DIAGNOSIS — Z9189 Other specified personal risk factors, not elsewhere classified: Secondary | ICD-10-CM | POA: Diagnosis not present

## 2017-11-26 DIAGNOSIS — I5042 Chronic combined systolic (congestive) and diastolic (congestive) heart failure: Secondary | ICD-10-CM | POA: Diagnosis not present

## 2017-11-26 DIAGNOSIS — E559 Vitamin D deficiency, unspecified: Secondary | ICD-10-CM

## 2017-11-26 DIAGNOSIS — R7303 Prediabetes: Secondary | ICD-10-CM

## 2017-11-26 NOTE — Progress Notes (Signed)
Office: [phone]  /  Fax: [phone]   HPI:   Chief Complaint: OBESITY Meredith Diaz is here to discuss her progress with her obesity treatment plan. She is on the portion control better and make smarter food choices, such as increase vegetables and decrease simple carbohydrates and is following her eating plan approximately 40 % of the time. She states she is walking for 10 minutes 7 times per week. Meredith Diaz notes the past few weeks she has struggled with fluid retention. Just returned from [place] and tried to make good choices.  Her weight is 216 lb (98 kg) today and has gained 3 pounds since her last visit. She has lost 0 lbs since starting treatment with [place].  Congestive Heart Failure Meredith Diaz is a 37 y.o. female with congestive heart failure. She tried to get in to see Dr. Shirlee Latch but couldn't secondary to 4th of July holiday.  Pre-Diabetes Meredith Diaz has a diagnosis of pre-diabetes based on her elevated Hgb A1c and was informed this puts her at greater risk of developing diabetes. She denies GI side effect of metformin and notes carbohydrate cravings. She continues to work on diet and exercise to decrease risk of diabetes. She denies nausea or hypoglycemia.  At risk for diabetes Meredith Diaz is at higher than average risk for developing diabetes due to her obesity and pre-diabetes. She currently denies polyuria or polydipsia.  Vitamin D Deficiency Meredith Diaz has a diagnosis of vitamin D deficiency. She is currently taking prescription Vit D She notes fatigue and denies nausea, vomiting or muscle weakness.  ALLERGIES: Allergies  Allergen Reactions  . Lisinopril Swelling    Angioedema facial, mostly just lips, no trouble breathing    MEDICATIONS: Current Outpatient Medications on File Prior to Visit  Medication Sig Dispense Refill  . budesonide-formoterol (SYMBICORT) 160-4.5 MCG/ACT inhaler INHALE 2 PUFFS INTO THE LUNGS TWICE DAILY 10.2 Inhaler 9  . DULoxetine (CYMBALTA) 60 MG capsule TAKE  1 CAPSULE (60 MG TOTAL) BY MOUTH DAILY. 90 capsule 2  . levothyroxine (SYNTHROID, LEVOTHROID) 125 MCG tablet Take 1 tablet (125 mcg total) by mouth daily. NEED OFFICE VISIT FOR FURTHER REFILLS 90 tablet 0  . metFORMIN (GLUCOPHAGE) 500 MG tablet Take 1 tablet (500 mg total) by mouth daily with breakfast. 30 tablet 0  . metoprolol succinate (TOPROL-XL) 50 MG 24 hr tablet TAKE 1 TAB BY MOUTH DAILY WITH OR IMMEDIATELY FOLLOWING A MEAL NEEDS OFFICE VISIT FOR FURTHER REFILL 30 tablet 11  . spironolactone (ALDACTONE) 25 MG tablet TAKE 1 TABLET EVERY DAY 30 tablet 6  . torsemide (DEMADEX) 20 MG tablet Take 2 tablets (40 mg total) by mouth daily. 60 tablet 11  . valsartan (DIOVAN) 40 MG tablet Take 1 tablet (40 mg total) by mouth every morning AND 2 tablets (80 mg total) every evening. 90 tablet 3  . VENTOLIN HFA 108 (90 Base) MCG/ACT inhaler INHALE 1-2 PUFFS INTO THE LUNGS EVERY 6 (SIX) HOURS AS NEEDED FOR WHEEZING OR SHORTNESS OF BREATH. 18 Inhaler 3  . Vitamin D, Ergocalciferol, (DRISDOL) 50000 units CAPS capsule Take 1 capsule (50,000 Units total) by mouth every 7 (seven) days. 4 capsule 0   No current facility-administered medications on file prior to visit.     PAST MEDICAL HISTORY: Past Medical History:  Diagnosis Date  . Anxiety   . Asthma   . Back pain   . Chronic combined systolic and diastolic CHF (congestive heart failure) (HCC) 07/2011   a. Prior Echo: EF was 25-30%; b. No Sig CAD on  CATH; c. No significant finding on Cardiac MRI; d. follow-up Echo 2014 --  EF of 50%;  e. 03/2014 Echo: EF 40-45%, gr 1 DD; f. 11/2014 Echo: EF 40-45%, Gr 2 DD.  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  . Depression   . Dyspnea   . Essential hypertension   . Family history of breast cancer   . Family history of colon cancer   . Fibromyalgia   .  Gallbladder problem   . Hypothyroidism   . IBS (irritable bowel syndrome)   . Morbid obesity (HCC)   . Nonischemic cardiomyopathy (HCC)    a. unclear etiology;  b. 01/2012 Cardiac MRI: no infiltrative pathology, EF 52%;  b. 11/2014 Echo: EF 40-45%, gr 2 DD.  Meredith Diaz Kitchen Scoliosis   . Sleep apnea   . Stomach problems   . Thyroid disease   . Vitamin B 12 deficiency   . Vitamin D deficiency     PAST SURGICAL HISTORY: Past Surgical History:  Procedure Laterality Date  . CARDIAC CATHETERIZATION  08/2011   Normal Coronaries - LVEDP 32 mmHg  . Cardiac MRI  April 2013   No suggestion of a congenital abnormality; EF ~ 52% - no regional wall motion abnormalities., no increased myocardial signal intensity. No perfusion abnormality.  . CHOLECYSTECTOMY  2013  . TRANSTHORACIC ECHOCARDIOGRAM  07/2011   Moderate concentric LVH, mildly dilated. EF 25-30% no diastolic dysfunction parameters to suggest elevated LAP  . 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 Normal wall thickness (compared to prior reading of possible apical LVH), grade 2 DD -- with elevated LV filling pressures (however left atrium was read as normal size), normal artery function and size. No no suggestion of pulmonary hypertension    SOCIAL HISTORY: Social History   Tobacco Use  . Smoking status: Never Smoker  . Smokeless tobacco: Never Used  Substance Use Topics  . Alcohol use: No  . Drug use: No    FAMILY HISTORY: Family History  Problem Relation Age of Onset  . Depression Mother   . Alcoholism Mother   . Hypertension Father   . Heart disease Father   . Colon cancer Maternal Grandmother 70  . Heart attack Maternal Grandmother   . Heart disease Maternal Grandfather   . Heart attack Maternal Grandfather   . Heart disease Paternal Grandmother   . Breast cancer Paternal Grandmother 72        possible bilateral cancer  . Asthma Paternal Grandmother   . Heart attack Paternal Grandmother   . Heart attack Paternal Grandfather   . Breast cancer Maternal Aunt 50  . Breast cancer Paternal Aunt 25  . Stroke Neg Hx   . Thyroid disease Neg Hx     ROS: Review of Systems  Constitutional: Positive for malaise/fatigue. Negative for weight loss.  Gastrointestinal: Negative for nausea and vomiting.  Genitourinary: Negative for frequency.  Musculoskeletal:       Negative muscle weakness  Endo/Heme/Allergies: Negative for polydipsia.       Negative hypoglycemia    PHYSICAL EXAM: Blood pressure 106/74, pulse 81, temperature 97.8 F (36.6 C), temperature source Oral, height 4\' 11"  (1.499 m), weight 216 lb (98 kg), SpO2 98 %. Body mass  index is 43.63 kg/m. Physical Exam  Constitutional: She is oriented to person, place, and time. She appears well-developed and well-nourished.  Cardiovascular: Normal rate.  Pulmonary/Chest: Effort normal.  Musculoskeletal: Normal range of motion.  Neurological: She is oriented to person, place, and time.  Skin: Skin is warm and dry.  Psychiatric: She has a normal mood and affect. Her behavior is normal.  Vitals reviewed.   RECENT LABS AND TESTS: BMET    Component Value Date/Time   NA 142 11/12/2017 1131   NA 140 08/07/2017 1116   NA 138 07/01/2015 1420   K 4.0 11/12/2017 1131   K 3.4 (L) 07/01/2015 1420   CL 96 11/12/2017 1131   CO2 39 (H) 11/12/2017 1131   CO2 29 07/01/2015 1420   GLUCOSE 105 (H) 11/12/2017 1131   GLUCOSE 101 07/01/2015 1420   BUN 17 11/12/2017 1131   BUN 18 08/07/2017 1116   BUN 22.2 07/01/2015 1420   CREATININE 0.84 11/12/2017 1131   CREATININE 1.2 (H) 07/01/2015 1420   CALCIUM 9.1 11/12/2017 1131   CALCIUM 9.1 07/01/2015 1420   GFRNONAA 72 08/07/2017 1116   GFRAA 83 08/07/2017 1116   Lab Results  Component Value Date   HGBA1C 5.9 (H) 08/07/2017   HGBA1C 5.6 06/15/2015   HGBA1C 5.7 11/01/2014   Lab Results   Component Value Date   INSULIN 35.3 (H) 08/07/2017   CBC    Component Value Date/Time   WBC 11.9 (H) 11/12/2017 1131   RBC 4.74 11/12/2017 1131   HGB 14.2 11/12/2017 1131   HGB 15.6 08/07/2017 1116   HGB 13.3 07/12/2015 1307   HCT 43.1 11/12/2017 1131   HCT 44.4 08/07/2017 1116   HCT 40.3 07/12/2015 1307   PLT 294.0 11/12/2017 1131   PLT 460 (H) 07/12/2015 1307   MCV 90.9 11/12/2017 1131   MCV 87 08/07/2017 1116   MCV 88.7 07/12/2015 1307   MCH 30.6 08/07/2017 1116   MCH 29.9 03/16/2016 1451   MCHC 33.0 11/12/2017 1131   RDW 14.2 11/12/2017 1131   RDW 13.7 08/07/2017 1116   RDW 13.0 07/12/2015 1307   LYMPHSABS 3.8 (H) 08/07/2017 1116   LYMPHSABS 4.1 (H) 07/12/2015 1307   MONOABS 1.1 (H) 10/02/2016 1546   MONOABS 1.4 (H) 07/12/2015 1307   EOSABS 0.3 08/07/2017 1116   BASOSABS 0.1 08/07/2017 1116   BASOSABS 0.1 07/12/2015 1307   Iron/TIBC/Ferritin/ %Sat No results found for: IRON, TIBC, FERRITIN, IRONPCTSAT Lipid Panel     Component Value Date/Time   CHOL 197 08/07/2017 1116   TRIG 122 08/07/2017 1116   HDL 49 08/07/2017 1116   CHOLHDL 4 01/28/2014 1123   VLDL 19.2 01/28/2014 1123   LDLCALC 124 (H) 08/07/2017 1116   Hepatic Function Panel     Component Value Date/Time   PROT 7.8 11/12/2017 1131   PROT 7.8 08/07/2017 1116   PROT 8.2 07/01/2015 1420   ALBUMIN 4.2 11/12/2017 1131   ALBUMIN 4.4 08/07/2017 1116   ALBUMIN 3.5 07/01/2015 1420   AST 17 11/12/2017 1131   AST 24 07/01/2015 1420   ALT 30 11/12/2017 1131   ALT 35 07/01/2015 1420   ALKPHOS 110 11/12/2017 1131   ALKPHOS 109 07/01/2015 1420   BILITOT 0.4 11/12/2017 1131   BILITOT 0.4 08/07/2017 1116   BILITOT 0.38 07/01/2015 1420   BILIDIR <0.1 (L) 01/05/2015 2226   IBILI NOT CALCULATED 01/05/2015 2226      Component Value Date/Time   TSH 1.24 07/10/2017 0814   TSH  3.41 04/10/2017 1358   TSH 18.07 (H) 10/08/2016 0904  Results for JOVANI, COLQUHOUN (MRN [id number]) as of 11/26/2017 16:54  Ref.  Range 08/07/2017 11:16  Vitamin D, 25-Hydroxy Latest Ref Range: 30.0 - 100.0 ng/mL 9.7 (L)    ASSESSMENT AND PLAN: Chronic combined systolic and diastolic heart failure, NYHA class 3 (HCC)  Prediabetes  Vitamin D deficiency  At risk for diabetes mellitus  Class 3 severe obesity with serious comorbidity and body mass index (BMI) of 40.0 to 44.9 in adult, unspecified obesity type (HCC)  PLAN:  Congestive Heart Failure Meredith Diaz is to schedule close follow up with Dr. Shirlee Latch, and she agrees to follow up with our clinic in 2 weeks.  Pre-Diabetes Meredith Diaz will continue to work on weight loss, exercise, and decreasing simple carbohydrates in her diet to help decrease the risk of diabetes. We dicussed metformin including benefits and risks. She was informed that eating too many simple carbohydrates or too many calories at one sitting increases the likelihood of GI side effects. Meredith Diaz agrees to continue taking metformin 500 mg PO daily, and we will check labs at next visit. Meredith Diaz agrees to follow up with our clinic in 2 weeks as directed to monitor her progress.  Diabetes risk counselling Meredith Diaz was given extended (15 minutes) diabetes prevention counseling today. She is 37 y.o. female and has risk factors for diabetes including obesity and pre-diabetes. We discussed intensive lifestyle modifications today with an emphasis on weight loss as well as increasing exercise and decreasing simple carbohydrates in her diet.  Vitamin D Deficiency Meredith Diaz was informed that low vitamin D levels contributes to fatigue and are associated with obesity, breast, and colon cancer. Meredith Diaz agrees to continue taking prescription Vit D @50 ,000 IU every week and will follow up for routine testing of vitamin D, at least 2-3 times per year. She was informed of the risk of over-replacement of vitamin D and agrees to not increase her dose unless she discusses this with [place] first. We will check Vit D at next appointment and Meredith Diaz agrees  to follow up with our clinic in 2 weeks.  Obesity Meredith Diaz is currently in the action stage of change. As such, her goal is to continue with weight loss efforts She has agreed to keep a food journal with 1350-1500 calories and 85+ grams of protein daily Meredith Diaz has been instructed to work up to a goal of 150 minutes of combined cardio and strengthening exercise per week for weight loss and overall health benefits. We discussed the following Behavioral Modification Strategies today: increasing lean protein intake, increasing vegetables, work on meal planning and easy cooking plans, and planning for success   Meredith Diaz has agreed to follow up with our clinic in 2 weeks. She was informed of the importance of frequent follow up visits to maximize her success with intensive lifestyle modifications for her multiple health conditions.   OBESITY BEHAVIORAL INTERVENTION VISIT  Today's visit was # 8 out of 22.  Starting weight: 211 lbs Starting date: 08/07/17 Today's weight : 216 lbs Today's date: 11/26/2017 Total lbs lost to date: 0    ASK: We discussed the diagnosis of obesity with Meredith Diaz today and Meredith Diaz agreed to give [place] permission to discuss obesity behavioral modification therapy today.  ASSESS: Meredith Diaz has the diagnosis of obesity and her BMI today is 43.6 Meredith Diaz is in the action stage of change   ADVISE: Meredith Diaz was educated on the multiple health risks of obesity as well as the benefit of  weight loss to improve her health. She was advised of the need for long term treatment and the importance of lifestyle modifications.  AGREE: Multiple dietary modification options and treatment options were discussed and  Meredith Diaz agreed to the above obesity treatment plan.  I, Burt Knack, am acting as transcriptionist for Debbra Riding, MD  I have reviewed the above documentation for accuracy and completeness, and I agree with the above. - Debbra Riding, MD

## 2017-12-12 ENCOUNTER — Encounter (INDEPENDENT_AMBULATORY_CARE_PROVIDER_SITE_OTHER): Payer: Self-pay

## 2017-12-12 ENCOUNTER — Ambulatory Visit (INDEPENDENT_AMBULATORY_CARE_PROVIDER_SITE_OTHER): Payer: Self-pay | Admitting: Physician Assistant

## 2017-12-15 ENCOUNTER — Other Ambulatory Visit: Payer: Self-pay | Admitting: Internal Medicine

## 2017-12-19 DIAGNOSIS — G4733 Obstructive sleep apnea (adult) (pediatric): Secondary | ICD-10-CM | POA: Diagnosis not present

## 2018-01-17 ENCOUNTER — Other Ambulatory Visit: Payer: Self-pay | Admitting: Pulmonary Disease

## 2018-01-19 DIAGNOSIS — G4733 Obstructive sleep apnea (adult) (pediatric): Secondary | ICD-10-CM | POA: Diagnosis not present

## 2018-02-15 ENCOUNTER — Other Ambulatory Visit: Payer: Self-pay | Admitting: Pulmonary Disease

## 2018-02-18 DIAGNOSIS — G4733 Obstructive sleep apnea (adult) (pediatric): Secondary | ICD-10-CM | POA: Diagnosis not present

## 2018-02-20 DIAGNOSIS — L718 Other rosacea: Secondary | ICD-10-CM | POA: Diagnosis not present

## 2018-02-20 DIAGNOSIS — L308 Other specified dermatitis: Secondary | ICD-10-CM | POA: Diagnosis not present

## 2018-03-10 ENCOUNTER — Inpatient Hospital Stay (HOSPITAL_COMMUNITY)
Admission: EM | Admit: 2018-03-10 | Discharge: 2018-03-13 | DRG: 291 | Disposition: A | Discharge status: Home / Self Care | Payer: Commercial Managed Care - PPO | Attending: Internal Medicine | Admitting: Internal Medicine

## 2018-03-10 ENCOUNTER — Encounter: Payer: Self-pay | Admitting: Family

## 2018-03-10 ENCOUNTER — Emergency Department (HOSPITAL_COMMUNITY): Payer: Commercial Managed Care - PPO

## 2018-03-10 ENCOUNTER — Ambulatory Visit (INDEPENDENT_AMBULATORY_CARE_PROVIDER_SITE_OTHER): Payer: Commercial Managed Care - PPO | Admitting: Family

## 2018-03-10 ENCOUNTER — Encounter (HOSPITAL_COMMUNITY): Payer: Self-pay | Admitting: *Deleted

## 2018-03-10 ENCOUNTER — Other Ambulatory Visit: Payer: Self-pay

## 2018-03-10 VITALS — BP 110/78 | HR 79 | Temp 98.3°F | Ht 59.0 in | Wt 228.0 lb

## 2018-03-10 DIAGNOSIS — Z9989 Dependence on other enabling machines and devices: Secondary | ICD-10-CM | POA: Diagnosis not present

## 2018-03-10 DIAGNOSIS — J9621 Acute and chronic respiratory failure with hypoxia: Secondary | ICD-10-CM | POA: Diagnosis present

## 2018-03-10 DIAGNOSIS — J45909 Unspecified asthma, uncomplicated: Secondary | ICD-10-CM | POA: Diagnosis not present

## 2018-03-10 DIAGNOSIS — M797 Fibromyalgia: Secondary | ICD-10-CM | POA: Diagnosis present

## 2018-03-10 DIAGNOSIS — R739 Hyperglycemia, unspecified: Secondary | ICD-10-CM | POA: Diagnosis present

## 2018-03-10 DIAGNOSIS — I5043 Acute on chronic combined systolic (congestive) and diastolic (congestive) heart failure: Secondary | ICD-10-CM

## 2018-03-10 DIAGNOSIS — E039 Hypothyroidism, unspecified: Secondary | ICD-10-CM | POA: Diagnosis present

## 2018-03-10 DIAGNOSIS — D72829 Elevated white blood cell count, unspecified: Secondary | ICD-10-CM | POA: Diagnosis not present

## 2018-03-10 DIAGNOSIS — Z888 Allergy status to other drugs, medicaments and biological substances status: Secondary | ICD-10-CM

## 2018-03-10 DIAGNOSIS — I10 Essential (primary) hypertension: Secondary | ICD-10-CM | POA: Diagnosis present

## 2018-03-10 DIAGNOSIS — Z79899 Other long term (current) drug therapy: Secondary | ICD-10-CM

## 2018-03-10 DIAGNOSIS — J45901 Unspecified asthma with (acute) exacerbation: Secondary | ICD-10-CM | POA: Diagnosis not present

## 2018-03-10 DIAGNOSIS — Z79890 Hormone replacement therapy (postmenopausal): Secondary | ICD-10-CM

## 2018-03-10 DIAGNOSIS — Z9981 Dependence on supplemental oxygen: Secondary | ICD-10-CM

## 2018-03-10 DIAGNOSIS — R0602 Shortness of breath: Secondary | ICD-10-CM

## 2018-03-10 DIAGNOSIS — J4530 Mild persistent asthma, uncomplicated: Secondary | ICD-10-CM | POA: Diagnosis not present

## 2018-03-10 DIAGNOSIS — Z975 Presence of (intrauterine) contraceptive device: Secondary | ICD-10-CM

## 2018-03-10 DIAGNOSIS — E038 Other specified hypothyroidism: Secondary | ICD-10-CM | POA: Diagnosis not present

## 2018-03-10 DIAGNOSIS — Z23 Encounter for immunization: Secondary | ICD-10-CM

## 2018-03-10 DIAGNOSIS — Z803 Family history of malignant neoplasm of breast: Secondary | ICD-10-CM

## 2018-03-10 DIAGNOSIS — F329 Major depressive disorder, single episode, unspecified: Secondary | ICD-10-CM | POA: Diagnosis present

## 2018-03-10 DIAGNOSIS — G4733 Obstructive sleep apnea (adult) (pediatric): Secondary | ICD-10-CM | POA: Diagnosis present

## 2018-03-10 DIAGNOSIS — R0902 Hypoxemia: Secondary | ICD-10-CM

## 2018-03-10 DIAGNOSIS — I340 Nonrheumatic mitral (valve) insufficiency: Secondary | ICD-10-CM | POA: Diagnosis not present

## 2018-03-10 DIAGNOSIS — I5041 Acute combined systolic (congestive) and diastolic (congestive) heart failure: Secondary | ICD-10-CM | POA: Diagnosis present

## 2018-03-10 DIAGNOSIS — Z8249 Family history of ischemic heart disease and other diseases of the circulatory system: Secondary | ICD-10-CM

## 2018-03-10 DIAGNOSIS — Z6841 Body Mass Index (BMI) 40.0 and over, adult: Secondary | ICD-10-CM

## 2018-03-10 DIAGNOSIS — Z800 Family history of malignant neoplasm of digestive organs: Secondary | ICD-10-CM

## 2018-03-10 DIAGNOSIS — F419 Anxiety disorder, unspecified: Secondary | ICD-10-CM | POA: Diagnosis present

## 2018-03-10 DIAGNOSIS — M419 Scoliosis, unspecified: Secondary | ICD-10-CM | POA: Diagnosis present

## 2018-03-10 DIAGNOSIS — I428 Other cardiomyopathies: Secondary | ICD-10-CM | POA: Diagnosis present

## 2018-03-10 DIAGNOSIS — I110 Hypertensive heart disease with heart failure: Secondary | ICD-10-CM | POA: Diagnosis not present

## 2018-03-10 DIAGNOSIS — M7989 Other specified soft tissue disorders: Secondary | ICD-10-CM | POA: Diagnosis not present

## 2018-03-10 DIAGNOSIS — Z9049 Acquired absence of other specified parts of digestive tract: Secondary | ICD-10-CM

## 2018-03-10 DIAGNOSIS — E876 Hypokalemia: Secondary | ICD-10-CM | POA: Diagnosis present

## 2018-03-10 DIAGNOSIS — K589 Irritable bowel syndrome without diarrhea: Secondary | ICD-10-CM | POA: Diagnosis present

## 2018-03-10 DIAGNOSIS — Z825 Family history of asthma and other chronic lower respiratory diseases: Secondary | ICD-10-CM

## 2018-03-10 LAB — I-STAT BETA HCG BLOOD, ED (MC, WL, AP ONLY): I-stat hCG, quantitative: <5 m[IU]/mL (ref <5)

## 2018-03-10 LAB — BASIC METABOLIC PANEL
ANION GAP: 5 (ref 5–15)
BUN: 16 mg/dL (ref 6–20)
CALCIUM: 9 mg/dL (ref 8.9–10.3)
CO2: 34 mmol/L — ABNORMAL HIGH (ref 22–32)
Chloride: 103 mmol/L (ref 98–111)
Creatinine, Ser: 0.87 mg/dL (ref 0.44–1.00)
GLUCOSE: 98 mg/dL (ref 70–99)
Potassium: 3.4 mmol/L — ABNORMAL LOW (ref 3.5–5.1)
Sodium: 142 mmol/L (ref 135–145)

## 2018-03-10 LAB — I-STAT TROPONIN, ED: Troponin i, poc: 0.01 ng/mL (ref 0.00–0.08)

## 2018-03-10 LAB — CBC
HCT: 45.6 % (ref 36.0–46.0)
Hemoglobin: 13.7 g/dL (ref 12.0–15.0)
MCH: 29.2 pg (ref 26.0–34.0)
MCHC: 30 g/dL (ref 30.0–36.0)
MCV: 97.2 fL (ref 80.0–100.0)
NRBC: 0 % (ref 0.0–0.2)
Platelets: 340 10*3/uL (ref 150–400)
RBC: 4.69 MIL/uL (ref 3.87–5.11)
RDW: 14 % (ref 11.5–15.5)
WBC: 13 10*3/uL — ABNORMAL HIGH (ref 4.0–10.5)

## 2018-03-10 LAB — BRAIN NATRIURETIC PEPTIDE: B Natriuretic Peptide: 125.1 pg/mL — ABNORMAL HIGH (ref 0.0–100.0)

## 2018-03-10 LAB — D-DIMER, QUANTITATIVE: D-Dimer, Quant: 1.03 ug/mL-FEU — ABNORMAL HIGH (ref 0.00–0.50)

## 2018-03-10 MED ORDER — SPIRONOLACTONE 25 MG PO TABS
25.0000 mg | ORAL_TABLET | Freq: Every day | ORAL | Status: DC
  Administered 2018-03-11 – 2018-03-13 (×3): 25 mg via ORAL
  Filled 2018-03-10 (×3): qty 1

## 2018-03-10 MED ORDER — ALBUTEROL SULFATE (2.5 MG/3ML) 0.083% IN NEBU: 2.5000 mg | INHALATION_SOLUTION | RESPIRATORY_TRACT | Status: DC | PRN

## 2018-03-10 MED ORDER — IRBESARTAN 75 MG PO TABS
37.5000 mg | ORAL_TABLET | Freq: Every day | ORAL | Status: DC
  Administered 2018-03-10 – 2018-03-13 (×4): 37.5 mg via ORAL
  Filled 2018-03-10 (×4): qty 0.5

## 2018-03-10 MED ORDER — TRAZODONE HCL 50 MG PO TABS: 50.0000 mg | ORAL_TABLET | Freq: Every evening | ORAL | Status: DC | PRN

## 2018-03-10 MED ORDER — METOPROLOL SUCCINATE ER 25 MG PO TB24
25.0000 mg | ORAL_TABLET | Freq: Every day | ORAL | Status: DC
  Administered 2018-03-10 – 2018-03-12 (×3): 25 mg via ORAL
  Filled 2018-03-10 (×3): qty 1

## 2018-03-10 MED ORDER — FUROSEMIDE 10 MG/ML IJ SOLN
40.0000 mg | Freq: Once | INTRAMUSCULAR | Status: AC
  Administered 2018-03-10: 40 mg via INTRAVENOUS
  Filled 2018-03-10: qty 4

## 2018-03-10 MED ORDER — SODIUM CHLORIDE 0.9% FLUSH: 3.0000 mL | INTRAVENOUS | Status: DC | PRN

## 2018-03-10 MED ORDER — IOPAMIDOL (ISOVUE-370) INJECTION 76%
100.0000 mL | Freq: Once | INTRAVENOUS | Status: AC | PRN
  Administered 2018-03-10: 100 mL via INTRAVENOUS

## 2018-03-10 MED ORDER — LEVOTHYROXINE SODIUM 125 MCG PO TABS
125.0000 ug | ORAL_TABLET | Freq: Every day | ORAL | Status: DC
  Administered 2018-03-10 – 2018-03-12 (×3): 125 ug via ORAL
  Filled 2018-03-10 (×4): qty 1

## 2018-03-10 MED ORDER — SODIUM CHLORIDE 0.9% FLUSH
3.0000 mL | Freq: Two times a day (BID) | INTRAVENOUS | Status: DC
  Administered 2018-03-10 – 2018-03-13 (×6): 3 mL via INTRAVENOUS

## 2018-03-10 MED ORDER — DULOXETINE HCL 60 MG PO CPEP
60.0000 mg | ORAL_CAPSULE | Freq: Every day | ORAL | Status: DC
  Administered 2018-03-10 – 2018-03-13 (×4): 60 mg via ORAL
  Filled 2018-03-10 (×5): qty 1

## 2018-03-10 MED ORDER — ENOXAPARIN SODIUM 40 MG/0.4ML ~~LOC~~ SOLN
40.0000 mg | SUBCUTANEOUS | Status: DC
  Administered 2018-03-10 – 2018-03-12 (×3): 40 mg via SUBCUTANEOUS
  Filled 2018-03-10 (×3): qty 0.4

## 2018-03-10 MED ORDER — IOPAMIDOL (ISOVUE-370) INJECTION 76%
INTRAVENOUS | Status: AC
  Filled 2018-03-10: qty 100

## 2018-03-10 MED ORDER — ALPRAZOLAM 0.5 MG PO TABS: 1.0000 mg | ORAL_TABLET | Freq: Every day | ORAL | Status: DC | PRN

## 2018-03-10 MED ORDER — FUROSEMIDE 10 MG/ML IJ SOLN
80.0000 mg | Freq: Every day | INTRAMUSCULAR | Status: DC
  Administered 2018-03-11 – 2018-03-12 (×2): 80 mg via INTRAVENOUS
  Filled 2018-03-10 (×2): qty 8

## 2018-03-10 MED ORDER — ACETAMINOPHEN 325 MG PO TABS
650.0000 mg | ORAL_TABLET | ORAL | Status: DC | PRN
  Administered 2018-03-11: 650 mg via ORAL
  Filled 2018-03-10: qty 2

## 2018-03-10 MED ORDER — SODIUM CHLORIDE 0.9 % IV SOLN: 250.0000 mL | INTRAVENOUS | Status: DC | PRN

## 2018-03-10 MED ORDER — ONDANSETRON HCL 4 MG/2ML IJ SOLN
4.0000 mg | Freq: Four times a day (QID) | INTRAMUSCULAR | Status: DC | PRN
  Administered 2018-03-11 – 2018-03-12 (×2): 4 mg via INTRAVENOUS
  Filled 2018-03-10 (×2): qty 2

## 2018-03-10 MED ORDER — POTASSIUM CHLORIDE CRYS ER 20 MEQ PO TBCR
20.0000 meq | EXTENDED_RELEASE_TABLET | Freq: Once | ORAL | Status: AC
  Administered 2018-03-10: 20 meq via ORAL
  Filled 2018-03-10: qty 1

## 2018-03-10 MED ORDER — MOMETASONE FURO-FORMOTEROL FUM 200-5 MCG/ACT IN AERO
2.0000 | INHALATION_SPRAY | Freq: Two times a day (BID) | RESPIRATORY_TRACT | Status: DC
  Administered 2018-03-10 – 2018-03-13 (×6): 2 via RESPIRATORY_TRACT
  Filled 2018-03-10: qty 8.8

## 2018-03-10 NOTE — ED Notes (Signed)
Patient transported to CT 

## 2018-03-10 NOTE — ED Provider Notes (Signed)
[facility] EMERGENCY DEPARTMENT Provider Note   CSN: [id number] Arrival date & time: 03/10/18  4098     History   Chief Complaint Chief Complaint  Patient presents with  . Shortness of Breath    HPI Meredith Diaz is a 37 y.o. female with history of anxiety, asthma, combined systolic and diastolic CHF, congenital heart disease, hypertension, fibromyalgia, IBS, nonischemic cardiomyopathy, scoliosis, thyroid disease presents for evaluation of acute onset, progressively worsening shortness of breath for 3 days she notes significant dyspnea on exertion.  She is home O2 dependent at night only but has been using supplemental oxygen during the day which has been somewhat helpful.  She notes some nasal congestion and a nonproductive cough as well as chest tightness but denies chest pain, abdominal pain, nausea, or vomiting.  She does note some intermittent swelling of the right lower extremity which she states typically is bilateral.  Recently traveled to  this weekend, noted shortness of breath developed on the drive back.  Went to see her PCP today and was hypoxic with SPO2 saturations 78% on room air and was sent to the ED for further evaluation.  No recent surgeries, no hemoptysis, no prior history of DVT or PE.  She has an IUD in place for birth control, denies hormonal placement therapy otherwise.  The history is provided by the patient.    Past Medical History:  Diagnosis Date  . Anxiety   . Asthma   . Back pain   . Chronic combined systolic and diastolic CHF (congestive heart failure) (HCC) 07/2011   a. Prior Echo: EF was 25-30%; b. No Sig CAD on CATH; c. No significant finding on Cardiac MRI; d. follow-up Echo 2014 --  EF of 50%;  e. 03/2014 Echo: EF 40-45%, gr 1 DD; f. 11/2014 Echo: EF 40-45%, Gr 2 DD.  [name] Congenital heart disease, adult    a. 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  . Depression   . Dyspnea   . Essential hypertension   . Family history of breast cancer   . Family history of colon cancer   . Fibromyalgia   . Gallbladder problem   . Hypothyroidism   . IBS (irritable bowel syndrome)   . Morbid obesity (HCC)   . Nonischemic cardiomyopathy (HCC)    a. unclear etiology;  b. 01/2012 Cardiac MRI: no infiltrative pathology, EF 52%;  b. 11/2014 Echo: EF 40-45%, gr 2 DD.  [name] Scoliosis   . Sleep apnea   . Stomach problems   . Thyroid disease   . Vitamin B 12 deficiency   . Vitamin D deficiency     Patient Active Problem List   Diagnosis Date Noted  . 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 Lightheadedness 11/12/2017  . Genetic testing 01/15/2017  . Myalgia 10/09/2016  . Nocturnal hypoxemia 08/16/2016  . Fatigue 03/16/2016  . Obesity 11/15/2015  . Left lumbar radiculopathy 10/12/2015  . 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 B12 deficiency 09/26/2015  . Elevated WBC count 06/17/2015  . Adjustment disorder with mixed anxiety and depressed mood 01/13/2015  . Idiopathic scoliosis 10/14/2014  . OSA on CPAP 10/14/2014  . Dyspnea 08/16/2014  . Restrictive lung disease 08/16/2014  . Cardiomyopathy (HCC)   . Chronic combined systolic and diastolic heart failure, NYHA class 3 (HCC) 03/09/2014  .  Essential hypertension 03/09/2014  . Congenital heart disease, adult   . Hypothyroidism 01/28/2014  . Birth control 01/28/2014  . Asthma, chronic 01/28/2014    Past Surgical History:  Procedure Laterality Date  . CARDIAC CATHETERIZATION  08/2011   Normal Coronaries - LVEDP 32 mmHg  . Cardiac MRI  April 2013   No suggestion of a congenital abnormality; EF ~ 52% - no regional wall motion abnormalities., no increased myocardial signal intensity. No perfusion abnormality.  . CHOLECYSTECTOMY  2013  . TRANSTHORACIC ECHOCARDIOGRAM   07/2011   Moderate concentric LVH, mildly dilated. EF 25-30% no diastolic dysfunction parameters to suggest elevated LAP  . 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 Normal wall thickness (compared to prior reading of possible apical LVH), grade 2 DD -- with elevated LV filling pressures (however left atrium was read as normal size), normal artery function and size. No no suggestion of pulmonary hypertension     OB History    Gravida  0   Para  0   Term  0   Preterm  0   AB  0   Living  0     SAB  0   TAB  0   Ectopic  0   Multiple  0   Live Births  0            Home Medications    Prior to Admission medications   Medication Sig Start Date End Date Taking? Authorizing Provider  ALPRAZolam Prudy Feeler) 1 MG tablet Take 1 mg by mouth daily as needed for anxiety.   Yes [provider]  budesonide-formoterol (SYMBICORT) 160-4.5 MCG/ACT inhaler TAKE 2 PUFFS BY MOUTH TWICE A DAY Patient taking differently: Inhale 2 puffs into the lungs 2 (two) times daily.  02/17/18  Yes Michele Mcalpine, MD  clobetasol cream (TEMOVATE) 0.05 % Apply 1 application topically See admin instructions. Apply daily to affected areas of genital area as directed   Yes [provider]  DULoxetine (CYMBALTA) 60 MG capsule TAKE 1 CAPSULE (60 MG TOTAL) BY MOUTH DAILY. Patient taking differently: Take 60 mg by mouth daily.  09/12/17  Yes Myrlene Broker, MD  ibuprofen (ADVIL,MOTRIN) 200 MG tablet Take 200-400 mg by mouth every 8 (eight) hours as needed (for pain or headaches).    Yes [provider]  levothyroxine (SYNTHROID, LEVOTHROID) 125 MCG tablet Take 1 tablet (125 mcg total) by mouth daily. Patient taking differently: Take 125 mcg by mouth at bedtime.  12/16/17  Yes Myrlene Broker, MD  metoprolol succinate (TOPROL-XL) 50 MG 24 hr  tablet TAKE 1 TAB BY MOUTH DAILY WITH OR IMMEDIATELY FOLLOWING A MEAL NEEDS OFFICE VISIT FOR FURTHER REFILL Patient taking differently: Take 50 mg by mouth at bedtime.  06/19/17  Yes Laurey Morale, MD  spironolactone (ALDACTONE) 25 MG tablet TAKE 1 TABLET EVERY DAY Patient taking differently: Take 25 mg by mouth daily.  09/17/16  Yes Laurey Morale, MD  torsemide (DEMADEX) 20 MG tablet Take 2 tablets (40 mg total) by mouth daily. 07/02/17  Yes Laurey Morale, MD  traZODone (DESYREL) 50 MG tablet Take 50 mg by mouth at bedtime as needed for sleep.    Yes [provider]  valsartan (DIOVAN) 40 MG tablet Take 1 tablet (40 mg total) by mouth every morning AND 2 tablets (80 mg total) every evening. Patient  taking differently: Take 40 mg by mouth in the evening 09/23/17  Yes Laurey Morale, MD  VENTOLIN HFA 108 (90 Base) MCG/ACT inhaler INHALE 1-2 PUFFS INTO THE LUNGS EVERY 6 (SIX) HOURS AS NEEDED FOR WHEEZING OR SHORTNESS OF BREATH. Patient taking differently: Inhale 1-2 puffs into the lungs every 6 (six) hours as needed for wheezing or shortness of breath.  01/17/18  Yes Michele Mcalpine, MD  metFORMIN (GLUCOPHAGE) 500 MG tablet Take 1 tablet (500 mg total) by mouth daily with breakfast. Patient not taking: Reported on 03/10/2018 10/22/17   Demetrio Lapping, PA-C  Vitamin D, Ergocalciferol, (DRISDOL) 50000 units CAPS capsule Take 1 capsule (50,000 Units total) by mouth every 7 (seven) days. Patient not taking: Reported on 03/10/2018 10/22/17   Demetrio Lapping, PA-C    Family History Family History  Problem Relation Age of Onset  . Depression Mother   . Alcoholism Mother   . Hypertension Father   . Heart disease Father   . Colon cancer Maternal Grandmother 48  . Heart attack Maternal Grandmother   . Heart disease Maternal Grandfather   . Heart attack Maternal Grandfather   . Heart disease Paternal Grandmother   . Breast cancer Paternal Grandmother 74       possible bilateral cancer  .  Asthma Paternal Grandmother   . Heart attack Paternal Grandmother   . Heart attack Paternal Grandfather   . Breast cancer Maternal Aunt 50  . Breast cancer Paternal Aunt 56  . Stroke Neg Hx   . Thyroid disease Neg Hx     Social History Social History   Tobacco Use  . Smoking status: Never Smoker  . Smokeless tobacco: Never Used  Substance Use Topics  . Alcohol use: No  . Drug use: No     Allergies   Lisinopril   Review of Systems Review of Systems  Constitutional: Negative for chills and fever.  HENT: Positive for congestion.   Respiratory: Positive for cough, chest tightness and shortness of breath.   Cardiovascular: Positive for leg swelling. Negative for chest pain.  Gastrointestinal: Negative for abdominal pain, nausea and vomiting.     Physical Exam Updated Vital Signs BP 120/64   Pulse 81   Temp 98.7 F (37.1 C) (Oral)   Resp (!) 23   Wt 102.4 kg   SpO2 100%   BMI 45.59 kg/m   Physical Exam  Constitutional: She appears well-developed and well-nourished. No distress.  Chronically ill-appearing  HENT:  Head: Normocephalic and atraumatic.  Eyes: Conjunctivae are normal. Right eye exhibits no discharge. Left eye exhibits no discharge.  Neck: No JVD present. No tracheal deviation present.  Cardiovascular: Normal rate, regular rhythm and intact distal pulses.  Murmur heard. Trace pitting edema of the bilateral lower extremities, Homans sign present bilaterally.  Calves and ankles measure symmetrically at the widest part bilaterally.  No palpable cords, compartments are soft.  2+ radial and DP/PT pulses bilaterally  Pulmonary/Chest: Effort normal. Tachypnea noted. She has rales. She exhibits no tenderness.  Speaking in short phrases, frequent pauses.  On 2 L via nasal cannula with SPO2 saturations 98%  Abdominal: Soft. Bowel sounds are normal. She exhibits no distension. There is no tenderness.  Musculoskeletal:       Right lower leg: Normal. She exhibits  tenderness and edema.       Left lower leg: Normal. She exhibits tenderness and edema.  Neurological: She is alert.  Skin: Skin is warm and dry. No erythema.  Psychiatric:  She has a normal mood and affect. Her behavior is normal.  Nursing note and vitals reviewed.    ED Treatments / Results  Labs (all labs ordered are listed, but only abnormal results are displayed) Labs Reviewed  BASIC METABOLIC PANEL - Abnormal; Notable for the following components:      Result Value   Potassium 3.4 (*)    CO2 34 (*)    All other components within normal limits  CBC - Abnormal; Notable for the following components:   WBC 13.0 (*)    All other components within normal limits  BRAIN NATRIURETIC PEPTIDE - Abnormal; Notable for the following components:   B Natriuretic Peptide 125.1 (*)    All other components within normal limits  D-DIMER, QUANTITATIVE (NOT AT [facility]) - Abnormal; Notable for the following components:   D-Dimer, Quant 1.03 (*)    All other components within normal limits  HIV ANTIBODY (ROUTINE TESTING W REFLEX)  BASIC METABOLIC PANEL  I-STAT TROPONIN, ED  I-STAT BETA HCG BLOOD, ED ([facility], [facility], [facility] ONLY)    EKG EKG Interpretation  Date/Time:  Monday March 10 2018 09:57:13 EST Ventricular Rate:  81 PR Interval:  152 QRS Duration: 78 QT Interval:  358 QTC Calculation: 415 R Axis:   29 Text Interpretation:  Normal sinus rhythm Possible Left atrial enlargement Low voltage QRS Septal infarct , age undetermined ST & T wave abnormality, consider inferior ischemia Abnormal ECG Confirmed by Tilden Fossa [phone]) on 03/10/2018 12:21:59 PM   Radiology Dg Chest 2 View  Result Date: 03/10/2018 CLINICAL DATA:  Short of breath EXAM: CHEST - 2 VIEW COMPARISON:  11/12/2017 FINDINGS: Heart size upper normal. Widening of the superior mediastinum most likely mediastinal fat based on prior chest CT of 10/10/2016. Negative for heart failure. Negative for infiltrate or effusion. Prominent  thoracic kyphosis. Chronic rib deformities bilaterally. 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 IMPRESSION: No active cardiopulmonary disease. Electronically Signed   By: Marlan Palau M.D.   On: 03/10/2018 10:38   Ct Angio Chest Pe W And/or Wo Contrast  Result Date: 03/10/2018 CLINICAL DATA:  Shortness of breath since this morning. History of CHF. EXAM: CT ANGIOGRAPHY CHEST WITH CONTRAST TECHNIQUE: Multidetector CT imaging of the chest was performed using the standard protocol during bolus administration of intravenous contrast. Multiplanar CT image reconstructions and MIPs were obtained to evaluate the vascular anatomy. CONTRAST:  65 cc ISOVUE-370 IOPAMIDOL (ISOVUE-370) INJECTION 76% COMPARISON:  Chest CT 10/10/2016 FINDINGS: Cardiovascular: Stable cardiac enlargement. No pericardial effusion. Prominent pericardial and epicardial fat. The aorta is normal in caliber. No dissection. The branch vessels are patent. Scattered coronary artery calcifications, advanced for age. The pulmonary arterial tree is well opacified. No filling defects to suggest pulmonary embolism. Mediastinum/Nodes: No mediastinal or hilar mass or adenopathy. The esophagus is grossly normal. Lungs/Pleura: Patchy ground-glass opacity could suggest edema or inflammation. No focal airspace consolidation or pleural effusion. Upper Abdomen: Reflux of contrast is noted down the IVC and into the hepatic veins. This could be due to right heart failure or tricuspid regurgitation. Musculoskeletal: No significant findings. 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 Review of the MIP images confirms the above findings. IMPRESSION: 1. No CT findings for pulmonary embolism. 2. No aortic aneurysm or dissection. 3. Stable cardiac enlargement. 4. Reflux of contrast down the IVC and into the hepatic veins suggesting right heart failure or tricuspid regurgitation. 5. Patchy  ground-glass opacity in the lungs is likely edema or  inflammation. No pleural effusions or focal infiltrates. 6. Chronic spinal anomalies and associated scoliosis. Aortic Atherosclerosis (ICD10-I70.0). Electronically Signed   By: Rudie Meyer M.D.   On: 03/10/2018 16:53    Procedures Procedures (including critical care time)  Medications Ordered in ED Medications  ALPRAZolam (XANAX) tablet 1 mg (has no administration in time range)  traZODone (DESYREL) tablet 50 mg (has no administration in time range)  levothyroxine (SYNTHROID, LEVOTHROID) tablet 125 mcg (has no administration in time range)  mometasone-formoterol (DULERA) 200-5 MCG/ACT inhaler 2 puff (has no administration in time range)  albuterol (PROVENTIL) (2.5 MG/3ML) 0.083% nebulizer solution 2.5 mg (has no administration in time range)  sodium chloride flush (NS) 0.9 % injection 3 mL (has no administration in time range)  sodium chloride flush (NS) 0.9 % injection 3 mL (has no administration in time range)  0.9 %  sodium chloride infusion (has no administration in time range)  acetaminophen (TYLENOL) tablet 650 mg (has no administration in time range)  ondansetron (ZOFRAN) injection 4 mg (has no administration in time range)  enoxaparin (LOVENOX) injection 40 mg (has no administration in time range)  furosemide (LASIX) injection 40 mg (has no administration in time range)  furosemide (LASIX) injection 80 mg (has no administration in time range)  furosemide (LASIX) injection 40 mg (40 mg Intravenous Given 03/10/18 1504)  iopamidol (ISOVUE-370) 76 % injection 100 mL (100 mLs Intravenous Contrast Given 03/10/18 1632)  potassium chloride SA (K-DUR,KLOR-CON) CR tablet 20 mEq (20 mEq Oral Given 03/10/18 1838)     Initial Impression / Assessment and Plan / ED Course  I have reviewed the triage vital signs and the nursing notes.  Pertinent labs & imaging results that were available during my care of the patient were reviewed by me and  considered in my medical decision making (see chart for details).     Patient presenting for evaluation of worsening shortness of breath.  Found to be hypoxic at her PCPs office and was placed on supplemental oxygen.  She is afebrile, tachypneic in the ED, SPO2 saturations stable on supplemental oxygen but when ambulating desats to 85% on room air.  Concern for PE with complaint of right lower extremity edema although on examination appears to be equal bilaterally.  D-dimer was elevated and so a CTA of the chest was obtained which showed no evidence of PE. More consistent with right heart failure. Remainder of labwork reviewed by me shows elevated BNP, leukocytosis, mild hypokalemia.  She was given IV Lasix in the ED.  Spoke with Dr. Jarvis Newcomer with [facility] hospitalist service who agrees to assume care of patient and bring her into the hospital for further evaluation and management.  Final Clinical Impressions(s) / ED Diagnoses   Final diagnoses:  Acute on chronic combined systolic and diastolic congestive heart failure [facility])  Hypoxia    ED Discharge Orders    None       Jeanie Sewer, PA-C 03/10/18 1851    Tilden Fossa, MD 03/11/18 1427

## 2018-03-10 NOTE — H&P (Addendum)
History and Physical   Meredith Diaz ZOX:[id number] DOB: 02-16-1981 DOA: 03/10/2018  Referring MD/NP/PA: Michela Pitcher, PA PCP: Myrlene Broker, MD Outpatient Specialists: Cardiology, Dr. Shirlee Latch; Pulmonology, Dr. Kriste Basque  Patient coming from: Home by way of PCP office.  Chief Complaint: Dyspnea on exertion  HPI: Meredith Diaz is a 37 y.o. female with a history of asthma, OSA on nocturnal oxygen, not compliant with CPAP, morbid obesity, chronic combined CHF, depression and anxiety who presented on the advice of her PCP for increasing dyspnea on exertion. She's been experiencing general dyspnea worse with exertion associated with chest tightness for several months, at time has been improved by taking extra doses of torsemide, but states this worsened over the past week. She's also noticed some R > L leg swelling  ED Course: Found to be tachypneic and hypoxic to 85% while ambulating to bathroom with severe dyspnea, requiring 2L at rest. BNP 125, troponin negative, +d-dimer with subsequent CTA chest negative for PE. This showed patchy GGOs consistent with edema vs. inflammation, and reflux of contrast into hepatic veins suggestive of elevated right sided pressures/TR. 40mg  of IV lasix given and hospitalists called to admit.  Review of Systems: +nasal congestion without postnasal drip, rhinorrhea, sore throat, cough, sputum, wheezing, chest pain, palpitations, and per HPI. All others reviewed and are negative.   Past Medical History:  Diagnosis Date  . Anxiety   . Asthma   . Back pain   . Chronic combined systolic and diastolic CHF (congestive heart failure) (HCC) 07/2011   a. Prior Echo: EF was 25-30%; b. No Sig CAD on CATH; c. No significant finding on Cardiac MRI; d. follow-up Echo 2014 --  EF of 50%;  e. 03/2014 Echo: EF 40-45%, gr 1 DD; f. 11/2014 Echo: EF 40-45%, Gr 2 DD.  [name] Congenital heart disease, adult    a. status post repair in childhood -- was a failure to thrive child status post  Cardec surgery (unknown details); neck MRI does not suggest any abnormal finding.  . Depression   . Dyspnea   . Essential hypertension   . Family history of breast cancer   . Family history of colon cancer   . Fibromyalgia   . Gallbladder problem   . Hypothyroidism   . IBS (irritable bowel syndrome)   . Morbid obesity (HCC)   . Nonischemic cardiomyopathy (HCC)    a. unclear etiology;  b. 01/2012 Cardiac MRI: no infiltrative pathology, EF 52%;  b. 11/2014 Echo: EF 40-45%, gr 2 DD.  [name] Scoliosis   . Sleep apnea   . Stomach problems   . Thyroid disease   . Vitamin B 12 deficiency   . Vitamin D deficiency    Past Surgical History:  Procedure Laterality Date  . CARDIAC CATHETERIZATION  08/2011   Normal Coronaries - LVEDP 32 mmHg  . Cardiac MRI  April 2013   No suggestion of a congenital abnormality; EF ~ 52% - no regional wall motion abnormalities., no increased myocardial signal intensity. No perfusion abnormality.  . CHOLECYSTECTOMY  2013  . TRANSTHORACIC ECHOCARDIOGRAM  07/2011   Moderate concentric LVH, mildly dilated. EF 25-30% no diastolic dysfunction parameters to suggest elevated LAP  . 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 Normal wall thickness (compared to prior reading of possible apical LVH), grade 2 DD -- with elevated LV filling pressures (however left atrium  was read as normal size), normal artery function and size. No no suggestion of pulmonary hypertension   - Never smoker. Nondrinker. Lives with spouse.   reports that she has never smoked. She has never used smokeless tobacco. She reports that she does not drink alcohol or use drugs. Allergies  Allergen Reactions  . Lisinopril Swelling and Other (See Comments)    Angioedema facial, mostly just lips, no trouble breathing   Family History  Problem Relation Age of Onset  .  Depression Mother   . Alcoholism Mother   . Hypertension Father   . Heart disease Father   . Colon cancer Maternal Grandmother 65  . Heart attack Maternal Grandmother   . Heart disease Maternal Grandfather   . Heart attack Maternal Grandfather   . Heart disease Paternal Grandmother   . Breast cancer Paternal Grandmother 52       possible bilateral cancer  . Asthma Paternal Grandmother   . Heart attack Paternal Grandmother   . Heart attack Paternal Grandfather   . Breast cancer Maternal Aunt 50  . Breast cancer Paternal Aunt 47  . Stroke Neg Hx   . Thyroid disease Neg Hx    - Family history otherwise reviewed and not pertinent. Prior to Admission medications   Medication Sig Start Date End Date Taking? Authorizing Provider  ALPRAZolam Prudy Feeler) 1 MG tablet Take 1 mg by mouth daily as needed for anxiety.   Yes [provider]  budesonide-formoterol (SYMBICORT) 160-4.5 MCG/ACT inhaler TAKE 2 PUFFS BY MOUTH TWICE A DAY Patient taking differently: Inhale 2 puffs into the lungs 2 (two) times daily.  02/17/18  Yes Michele Mcalpine, MD  clobetasol cream (TEMOVATE) 0.05 % Apply 1 application topically See admin instructions. Apply daily to affected areas of genital area as directed   Yes [provider]  DULoxetine (CYMBALTA) 60 MG capsule TAKE 1 CAPSULE (60 MG TOTAL) BY MOUTH DAILY. Patient taking differently: Take 60 mg by mouth daily.  09/12/17  Yes Myrlene Broker, MD  ibuprofen (ADVIL,MOTRIN) 200 MG tablet Take 200-400 mg by mouth every 8 (eight) hours as needed (for pain or headaches).    Yes [provider]  levothyroxine (SYNTHROID, LEVOTHROID) 125 MCG tablet Take 1 tablet (125 mcg total) by mouth daily. Patient taking differently: Take 125 mcg by mouth at bedtime.  12/16/17  Yes Myrlene Broker, MD  metoprolol succinate (TOPROL-XL) 50 MG 24 hr tablet TAKE 1 TAB BY MOUTH DAILY WITH OR IMMEDIATELY FOLLOWING A MEAL NEEDS OFFICE VISIT FOR FURTHER  REFILL Patient taking differently: Take 50 mg by mouth at bedtime.  06/19/17  Yes Laurey Morale, MD  spironolactone (ALDACTONE) 25 MG tablet TAKE 1 TABLET EVERY DAY Patient taking differently: Take 25 mg by mouth daily.  09/17/16  Yes Laurey Morale, MD  torsemide (DEMADEX) 20 MG tablet Take 2 tablets (40 mg total) by mouth daily. 07/02/17  Yes Laurey Morale, MD  traZODone (DESYREL) 50 MG tablet Take 50 mg by mouth at bedtime as needed for sleep.    Yes [provider]  valsartan (DIOVAN) 40 MG tablet Take 1 tablet (40 mg total) by mouth every morning AND 2 tablets (80 mg total) every evening. Patient taking differently: Take 40 mg by mouth in the evening 09/23/17  Yes Laurey Morale, MD  VENTOLIN HFA 108 (90 Base) MCG/ACT inhaler INHALE 1-2 PUFFS INTO THE LUNGS EVERY 6 (SIX) HOURS AS NEEDED FOR WHEEZING OR SHORTNESS OF BREATH. Patient taking differently: Inhale  1-2 puffs into the lungs every 6 (six) hours as needed for wheezing or shortness of breath.  01/17/18  Yes Michele Mcalpine, MD  metFORMIN (GLUCOPHAGE) 500 MG tablet Take 1 tablet (500 mg total) by mouth daily with breakfast. Patient not taking: Reported on 03/10/2018 10/22/17   Demetrio Lapping, PA-C  Vitamin D, Ergocalciferol, (DRISDOL) 50000 units CAPS capsule Take 1 capsule (50,000 Units total) by mouth every 7 (seven) days. Patient not taking: Reported on 03/10/2018 10/22/17   Demetrio Lapping, PA-C    Physical Exam: Vitals:   03/10/18 1740 03/10/18 1745 03/10/18 1800 03/10/18 1815  BP:  120/76 117/80 120/64  Pulse:  81 84 81  Resp:  (!) 22 (!) 22 (!) 23  Temp:      TempSrc:      SpO2:  100% 100% 100%  Weight: 102.4 kg      Constitutional: Pleasant obese female in no distress, calm demeanor Eyes: Lids and conjunctivae normal, PERRL ENMT: Mucous membranes are moist. Posterior pharynx clear of any exudate or lesions. Normal dentition.  Neck: normal, supple, no masses, no thyromegaly or thyroid nodules palpable.   Respiratory: Tachypneic without accessory muscle use. Clear breath sounds to auscultation bilaterally Cardiovascular: Regular rate and rhythm, no murmurs, rubs, or gallops. No carotid bruits. Unable to see and HJ reflux or JVD. 1+ RLE and trace LLE edema. 2+ pedal pulses. Abdomen: Normoactive bowel sounds. No tenderness, non-distended, and no masses palpated. No hepatosplenomegaly. GU: No indwelling catheter Musculoskeletal: Clubbing noted of thumbs, no cyanosis. No joint deformity upper and lower extremities. Good ROM, no contractures. Normal muscle tone.  Skin: Warm, dry. No rashes, wounds, no ulcers. No significant lesions noted.  Neurologic: CN II-XII grossly intact. Speech normal. No focal deficits in motor strength or sensation in all extremities.  Psychiatric: Alert and oriented x3. Normal judgment and insight. Mood euthymic with congruent, broad affect.   Labs on Admission: I have personally reviewed following labs and imaging studies  CBC: Recent Labs  Lab 03/10/18 1020  WBC 13.0*  HGB 13.7  HCT 45.6  MCV 97.2  PLT 340   Basic Metabolic Panel: Recent Labs  Lab 03/10/18 1020  NA 142  K 3.4*  CL 103  CO2 34*  GLUCOSE 98  BUN 16  CREATININE 0.87  CALCIUM 9.0   GFR: Estimated Creatinine Clearance: 92.6 mL/min (by C-G formula based on SCr of 0.87 mg/dL). Liver Function Tests: No results for input(s): AST, ALT, ALKPHOS, BILITOT, PROT, ALBUMIN in the last 168 hours. No results for input(s): LIPASE, AMYLASE in the last 168 hours. No results for input(s): AMMONIA in the last 168 hours. Coagulation Profile: No results for input(s): INR, PROTIME in the last 168 hours. Cardiac Enzymes: No results for input(s): CKTOTAL, CKMB, CKMBINDEX, TROPONINI in the last 168 hours. BNP (last 3 results) Recent Labs    04/08/17 1350 11/12/17 1131  PROBNP 42.0 118.0*   HbA1C: No results for input(s): HGBA1C in the last 72 hours. CBG: No results for input(s): GLUCAP in the last 168  hours. Lipid Profile: No results for input(s): CHOL, HDL, LDLCALC, TRIG, CHOLHDL, LDLDIRECT in the last 72 hours. Thyroid Function Tests: No results for input(s): TSH, T4TOTAL, FREET4, T3FREE, THYROIDAB in the last 72 hours. Anemia Panel: No results for input(s): VITAMINB12, FOLATE, FERRITIN, TIBC, IRON, RETICCTPCT in the last 72 hours. Urine analysis:    Component Value Date/Time   COLORURINE AMBER (A) 01/28/2015 0315   APPEARANCEUR CLOUDY (A) 01/28/2015 0315   LABSPEC 1.023  01/28/2015 0315   PHURINE 5.0 01/28/2015 0315   GLUCOSEU NEGATIVE 01/28/2015 0315   HGBUR NEGATIVE 01/28/2015 0315   BILIRUBINUR SMALL (A) 01/28/2015 0315   KETONESUR 15 (A) 01/28/2015 0315   PROTEINUR 100 (A) 01/28/2015 0315   UROBILINOGEN 1.0 01/28/2015 0315   NITRITE NEGATIVE 01/28/2015 0315   LEUKOCYTESUR NEGATIVE 01/28/2015 0315    No results found for this or any previous visit (from the past 240 hour(s)).   Radiological Exams on Admission: Dg Chest 2 View  Result Date: 03/10/2018 CLINICAL DATA:  Short of breath EXAM: CHEST - 2 VIEW COMPARISON:  11/12/2017 FINDINGS: Heart size upper normal. Widening of the superior mediastinum most likely mediastinal fat based on prior chest CT of 10/10/2016. Negative for heart failure. Negative for infiltrate or effusion. Prominent thoracic kyphosis. Chronic rib deformities bilaterally. 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 IMPRESSION: No active cardiopulmonary disease. Electronically Signed   By: Marlan Palau M.D.   On: 03/10/2018 10:38   Ct Angio Chest Pe W And/or Wo Contrast  Result Date: 03/10/2018 CLINICAL DATA:  Shortness of breath since this morning. History of CHF. EXAM: CT ANGIOGRAPHY CHEST WITH CONTRAST TECHNIQUE: Multidetector CT imaging of the chest was performed using the standard protocol during bolus administration of intravenous contrast. Multiplanar CT image reconstructions and MIPs were obtained to evaluate the vascular anatomy.  CONTRAST:  65 cc ISOVUE-370 IOPAMIDOL (ISOVUE-370) INJECTION 76% COMPARISON:  Chest CT 10/10/2016 FINDINGS: Cardiovascular: Stable cardiac enlargement. No pericardial effusion. Prominent pericardial and epicardial fat. The aorta is normal in caliber. No dissection. The branch vessels are patent. Scattered coronary artery calcifications, advanced for age. The pulmonary arterial tree is well opacified. No filling defects to suggest pulmonary embolism. Mediastinum/Nodes: No mediastinal or hilar mass or adenopathy. The esophagus is grossly normal. Lungs/Pleura: Patchy ground-glass opacity could suggest edema or inflammation. No focal airspace consolidation or pleural effusion. Upper Abdomen: Reflux of contrast is noted down the IVC and into the hepatic veins. This could be due to right heart failure or tricuspid regurgitation. Musculoskeletal: No significant findings. 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 Review of the MIP images confirms the above findings. IMPRESSION: 1. No CT findings for pulmonary embolism. 2. No aortic aneurysm or dissection. 3. Stable cardiac enlargement. 4. Reflux of contrast down the IVC and into the hepatic veins suggesting right heart failure or tricuspid regurgitation. 5. Patchy ground-glass opacity in the lungs is likely edema or inflammation. No pleural effusions or focal infiltrates. 6. Chronic spinal anomalies and associated scoliosis. Aortic Atherosclerosis (ICD10-I70.0). Electronically Signed   By: Rudie Meyer M.D.   On: 03/10/2018 16:53    EKG: Independently reviewed. NSR, normal axis, diffuse artifact and diffuse T wave abnormalities in multiple distributions.  Assessment/Plan Principal Problem:   Acute combined systolic (congestive) and diastolic (congestive) heart failure (HCC) Active Problems:   Hypothyroidism   Asthma, chronic   Essential hypertension   OSA on CPAP   Elevated WBC count    Morbid obesity (HCC)    Acute on chronic combined HFrEF, also suspect RV failure: Unclear precipitant.  - Recheck echocardiogram. Unclear precipitant, no recent admissions, ?worsening of previous findings.  - Given lasix 40mg  IV in ED with "about the same" urine output that she's been getting with torsemide 40mg  at home. Will give additional 40mg  IV tonight, and plan 80mg  IV in AM.  - Check weight, trend weight, I/O, BMP in AM.  - BP is well wnl, will give decreased doses  of ARB and BB tonight (take pills at night).   Acute on chronic hypoxic respiratory failure and dyspnea on exertion: suspected multifactorial, primarily CHF exacerbation as above, but on background of obesity, OSA untreated, probable pulmonary HTN, and asthma as well as deconditioning.  - Continue supplemental oxygen to maintain SpO2 >90%. Normally requires nocturnal oxygen, but now requiring it with ambulation.  - Diurese as above - Continue home medications for asthma - CPAP as below - Follow up with cardiology and pulmonology. Would encourage reinitiation of cardiopulmonary rehab.   Morbid obesity: BMI is 45, has been to weight loss clinic and undergone many other interventions (e.g. weight watchers, etc.) without benefit and has severe associated illnesses. Would think that bariatric surgery could be considered.  Asthma: Chronic. No wheezing.  - Continue home medications  OSA:  - CPAP qHS. Counseled that this is important for cardiac health.  Hypothyroidism: Last TSH wnl - Continue synthroid.   Mild hypokalemia: Anticipate worsening with augmentation of diuresis.  - Will give K and recheck in AM  Depression, anxiety:  - Continue home medications  R > L leg swelling: In the setting of + d-dimer and recent travel will check U/S  Leukocytosis: Chronic, actually below baseline. No focal signs of infection.  - PCP follow up  DVT prophylaxis: Lovenox  Code Status: Full confirmed at admission  Family  Communication: Husband at bedside Disposition Plan: Observe, likely DC 11/5 back home Consults called: None  Admission status: Observation    Tyrone Nine, MD [facility] [email or web address] Password [facility] 03/10/2018, 6:56 PM

## 2018-03-10 NOTE — ED Notes (Signed)
Pt ambulated to restroom on pulse ox monitor. Pts O2 saturation was at 100% while ambulating to the restroom. On the way back to pts room she was sating at 90-92% and then it dropped to 85%. Pt stated that she felt like she was having difficulty getting air into her lungs and pt this point pt became tachypnic. She is resting comfortably now in the hospital bed on 2L of O2 at 100% saturation.

## 2018-03-10 NOTE — Plan of Care (Signed)

## 2018-03-10 NOTE — Progress Notes (Signed)
Meredith Diaz is a 37 y.o. female with the following history as recorded in EpicCare:  Patient Active Problem List   Diagnosis Date Noted  . Lightheadedness 11/12/2017  . Genetic testing 01/15/2017  . Myalgia 10/09/2016  . Nocturnal hypoxemia 08/16/2016  . Fatigue 03/16/2016  . Obesity 11/15/2015  . Left lumbar radiculopathy 10/12/2015  . 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 B12 deficiency 09/26/2015  . Elevated WBC count 06/17/2015  . Adjustment disorder with mixed anxiety and depressed mood 01/13/2015  . Idiopathic scoliosis 10/14/2014  . OSA on CPAP 10/14/2014  . Dyspnea 08/16/2014  . Restrictive lung disease 08/16/2014  . Cardiomyopathy (HCC)   . Chronic combined systolic and diastolic heart failure, NYHA class 3 (HCC) 03/09/2014  . Essential hypertension 03/09/2014  . Congenital heart disease, adult   . Hypothyroidism 01/28/2014  . Birth control 01/28/2014  . Asthma, chronic 01/28/2014    Current Outpatient Medications  Medication Sig Dispense Refill  . budesonide-formoterol (SYMBICORT) 160-4.5 MCG/ACT inhaler TAKE 2 PUFFS BY MOUTH TWICE A DAY 10.2 Inhaler 0  . DULoxetine (CYMBALTA) 60 MG capsule TAKE 1 CAPSULE (60 MG TOTAL) BY MOUTH DAILY. 90 capsule 2  . levothyroxine (SYNTHROID, LEVOTHROID) 125 MCG tablet Take 1 tablet (125 mcg total) by mouth daily. 90 tablet 2  . metFORMIN (GLUCOPHAGE) 500 MG tablet Take 1 tablet (500 mg total) by mouth daily with breakfast. 30 tablet 0  . metoprolol succinate (TOPROL-XL) 50 MG 24 hr tablet TAKE 1 TAB BY MOUTH DAILY WITH OR IMMEDIATELY FOLLOWING A MEAL NEEDS OFFICE VISIT FOR FURTHER REFILL 30 tablet 11  . spironolactone (ALDACTONE) 25 MG tablet TAKE 1 TABLET EVERY DAY 30 tablet 6  . torsemide (DEMADEX) 20 MG tablet Take 2 tablets (40 mg total) by mouth daily. 60 tablet 11  . valsartan (DIOVAN) 40 MG tablet Take 1 tablet (40 mg total) by mouth every morning AND 2 tablets (80 mg total) every evening. 90 tablet 3  .  VENTOLIN HFA 108 (90 Base) MCG/ACT inhaler INHALE 1-2 PUFFS INTO THE LUNGS EVERY 6 (SIX) HOURS AS NEEDED FOR WHEEZING OR SHORTNESS OF BREATH. 18 Inhaler 0  . Vitamin D, Ergocalciferol, (DRISDOL) 50000 units CAPS capsule Take 1 capsule (50,000 Units total) by mouth every 7 (seven) days. 4 capsule 0   No current facility-administered medications for this visit.     Allergies: Lisinopril  Past Medical History:  Diagnosis Date  . Anxiety   . Asthma   . Back pain   . Chronic combined systolic and diastolic CHF (congestive heart failure) (HCC) 07/2011   a. Prior Echo: EF was 25-30%; b. No Sig CAD on CATH; c. No significant finding on Cardiac MRI; d. follow-up Echo 2014 --  EF of 50%;  e. 03/2014 Echo: EF 40-45%, gr 1 DD; f. 11/2014 Echo: EF 40-45%, Gr 2 DD.  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  . Depression   . Dyspnea   . Essential hypertension   . Family history of breast cancer   . Family history of colon cancer   . Fibromyalgia   . Gallbladder problem   . Hypothyroidism   . IBS (irritable bowel syndrome)   . Morbid obesity (HCC)   . Nonischemic cardiomyopathy (HCC)    a. unclear etiology;  b. 01/2012 Cardiac MRI: no infiltrative pathology, EF 52%;  b. 11/2014 Echo: EF 40-45%, gr 2 DD.  [name] Scoliosis   . Sleep apnea   . Stomach problems   . Thyroid disease   . Vitamin B 12 deficiency   . Vitamin D deficiency     Past Surgical History:  Procedure Laterality Date  . CARDIAC CATHETERIZATION  08/2011   Normal Coronaries - LVEDP 32 mmHg  . Cardiac MRI  April 2013   No suggestion of a congenital abnormality; EF ~ 52% - no regional wall motion abnormalities., no increased myocardial signal intensity. No perfusion abnormality.  . CHOLECYSTECTOMY  2013  . TRANSTHORACIC ECHOCARDIOGRAM  07/2011   Moderate concentric LVH, mildly dilated.  EF 25-30% no diastolic dysfunction parameters to suggest elevated LAP  . 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 Normal wall thickness (compared to prior reading of possible apical LVH), grade 2 DD -- with elevated LV filling pressures (however left atrium was read as normal size), normal artery function and size. No no suggestion of pulmonary hypertension    Family History  Problem Relation Age of Onset  . Depression Mother   . Alcoholism Mother   . Hypertension Father   . Heart disease Father   . Colon cancer Maternal Grandmother 73  . Heart attack Maternal Grandmother   . Heart disease Maternal Grandfather   . Heart attack Maternal Grandfather   . Heart disease Paternal Grandmother   . Breast cancer Paternal Grandmother 96       possible bilateral cancer  . Asthma Paternal Grandmother   . Heart attack Paternal Grandmother   . Heart attack Paternal Grandfather   . Breast cancer Maternal Aunt 50  . Breast cancer Paternal Aunt 62  . Stroke Neg Hx   . Thyroid disease Neg Hx     Social History   Tobacco Use  . Smoking status: Never Smoker  . Smokeless tobacco: Never Used  Substance Use Topics  . Alcohol use: No    Subjective:  Patient presents with concerns for worsening symptoms of SOB, head congestion; hard to differentiate whether heart failure flare vs asthma flare; does not feel that her ventolin is helping at all; no fever; very difficult to breathe; has already been taking a 3rd dose of Torsemide for the past week; in July, her Torsemide was increased to 4 tablets daily x 1-2 weeks for short term treatment; was due to see her CHF at that time but unfortunately has not been able to schedule that appointment;  Feels that her weight is somewhere around 220-222 pounds; weight has never been this high; can tell that she is swollen in her  hands and abdomen; does not typically swell in her lower extremities with CHF exacerbation.    Objective:  Vitals:   03/10/18 0852  BP: 110/78  Pulse: 79  Temp: 98.3 F (36.8 C)  TempSrc: Oral  SpO2: 95%  Weight: 228 lb (103.4 kg)  Height: 4\' 11"  (1.499 m)    General: Well developed, well nourished, in mild distress;   Skin : Warm and dry.  Head: Normocephalic and atraumatic  Eyes: Sclera and conjunctiva clear; pupils round and reactive to light; extraocular movements intact  Ears: External normal; canals clear; tympanic membranes normal  Oropharynx: Pink, supple. No suspicious lesions  Neck: Supple without thyromegaly, adenopathy  Lungs: Respirations labored; decreased breath sounds but no wheezing noted;  CVS exam: normal  rate and regular rhythm.  Extremities: swelling noted in both hands/ rings are tight on fingers, no cyanosis, no clubbing  Vessels: Symmetric bilaterally  Neurologic: Alert and oriented; speech intact; face symmetrical; moves all extremities well; CNII-XII intact without focal deficit   Assessment:  1. Shortness of breath     Plan:  Based on her shortness of breath and weight gain ( ? At least 5 pounds if not more), am concerned for CHF exacerbation; due to appearance in office, recommend ER evaluation; her husband plans to take her to [facility] ER;   No follow-ups on file.  Orders Placed This Encounter  Procedures  . DG Chest 2 View    Standing Status:   Future    Standing Expiration Date:   05/11/2019    Order Specific Question:   Reason for Exam (SYMPTOM  OR DIAGNOSIS REQUIRED)    Answer:   shortness of breath    Order Specific Question:   Is patient pregnant?    Answer:   No    Order Specific Question:   Preferred imaging location?    Answer:   [facility]    Order Specific Question:   Radiology Contrast Protocol - do NOT remove file path    Answer:   [email or web address]    Requested Prescriptions    No  prescriptions requested or ordered in this encounter

## 2018-03-10 NOTE — ED Triage Notes (Signed)
Pt in c/o worsening shortness of breath this morning, shortness of breath at rest but worse with exertion, history of CHF, sent over by PCP, no distress at this time

## 2018-03-11 ENCOUNTER — Encounter (HOSPITAL_COMMUNITY): Payer: Self-pay | Admitting: General Practice

## 2018-03-11 ENCOUNTER — Other Ambulatory Visit: Payer: Self-pay

## 2018-03-11 ENCOUNTER — Observation Stay (HOSPITAL_BASED_OUTPATIENT_CLINIC_OR_DEPARTMENT_OTHER): Payer: Commercial Managed Care - PPO

## 2018-03-11 ENCOUNTER — Observation Stay (HOSPITAL_COMMUNITY): Payer: Commercial Managed Care - PPO

## 2018-03-11 DIAGNOSIS — Z888 Allergy status to other drugs, medicaments and biological substances status: Secondary | ICD-10-CM | POA: Diagnosis not present

## 2018-03-11 DIAGNOSIS — J9621 Acute and chronic respiratory failure with hypoxia: Secondary | ICD-10-CM | POA: Diagnosis present

## 2018-03-11 DIAGNOSIS — I110 Hypertensive heart disease with heart failure: Secondary | ICD-10-CM | POA: Diagnosis present

## 2018-03-11 DIAGNOSIS — M7989 Other specified soft tissue disorders: Secondary | ICD-10-CM | POA: Diagnosis not present

## 2018-03-11 DIAGNOSIS — I428 Other cardiomyopathies: Secondary | ICD-10-CM | POA: Diagnosis present

## 2018-03-11 DIAGNOSIS — I5043 Acute on chronic combined systolic (congestive) and diastolic (congestive) heart failure: Secondary | ICD-10-CM

## 2018-03-11 DIAGNOSIS — D72829 Elevated white blood cell count, unspecified: Secondary | ICD-10-CM

## 2018-03-11 DIAGNOSIS — F329 Major depressive disorder, single episode, unspecified: Secondary | ICD-10-CM | POA: Diagnosis present

## 2018-03-11 DIAGNOSIS — Z9981 Dependence on supplemental oxygen: Secondary | ICD-10-CM | POA: Diagnosis not present

## 2018-03-11 DIAGNOSIS — Z79899 Other long term (current) drug therapy: Secondary | ICD-10-CM | POA: Diagnosis not present

## 2018-03-11 DIAGNOSIS — I10 Essential (primary) hypertension: Secondary | ICD-10-CM

## 2018-03-11 DIAGNOSIS — I340 Nonrheumatic mitral (valve) insufficiency: Secondary | ICD-10-CM

## 2018-03-11 DIAGNOSIS — F419 Anxiety disorder, unspecified: Secondary | ICD-10-CM | POA: Diagnosis present

## 2018-03-11 DIAGNOSIS — J45901 Unspecified asthma with (acute) exacerbation: Secondary | ICD-10-CM | POA: Diagnosis present

## 2018-03-11 DIAGNOSIS — M797 Fibromyalgia: Secondary | ICD-10-CM | POA: Diagnosis present

## 2018-03-11 DIAGNOSIS — M419 Scoliosis, unspecified: Secondary | ICD-10-CM | POA: Diagnosis present

## 2018-03-11 DIAGNOSIS — Z79890 Hormone replacement therapy (postmenopausal): Secondary | ICD-10-CM | POA: Diagnosis not present

## 2018-03-11 DIAGNOSIS — R0902 Hypoxemia: Secondary | ICD-10-CM | POA: Diagnosis present

## 2018-03-11 DIAGNOSIS — K589 Irritable bowel syndrome without diarrhea: Secondary | ICD-10-CM | POA: Diagnosis present

## 2018-03-11 DIAGNOSIS — G4733 Obstructive sleep apnea (adult) (pediatric): Secondary | ICD-10-CM | POA: Diagnosis present

## 2018-03-11 DIAGNOSIS — Z6841 Body Mass Index (BMI) 40.0 and over, adult: Secondary | ICD-10-CM | POA: Diagnosis not present

## 2018-03-11 DIAGNOSIS — J4530 Mild persistent asthma, uncomplicated: Secondary | ICD-10-CM

## 2018-03-11 DIAGNOSIS — Z8249 Family history of ischemic heart disease and other diseases of the circulatory system: Secondary | ICD-10-CM | POA: Diagnosis not present

## 2018-03-11 DIAGNOSIS — E038 Other specified hypothyroidism: Secondary | ICD-10-CM

## 2018-03-11 DIAGNOSIS — J45909 Unspecified asthma, uncomplicated: Secondary | ICD-10-CM | POA: Diagnosis not present

## 2018-03-11 DIAGNOSIS — E876 Hypokalemia: Secondary | ICD-10-CM | POA: Diagnosis present

## 2018-03-11 DIAGNOSIS — E039 Hypothyroidism, unspecified: Secondary | ICD-10-CM | POA: Diagnosis present

## 2018-03-11 DIAGNOSIS — Z975 Presence of (intrauterine) contraceptive device: Secondary | ICD-10-CM | POA: Diagnosis not present

## 2018-03-11 DIAGNOSIS — R739 Hyperglycemia, unspecified: Secondary | ICD-10-CM | POA: Diagnosis present

## 2018-03-11 DIAGNOSIS — Z9049 Acquired absence of other specified parts of digestive tract: Secondary | ICD-10-CM | POA: Diagnosis not present

## 2018-03-11 DIAGNOSIS — I5041 Acute combined systolic (congestive) and diastolic (congestive) heart failure: Secondary | ICD-10-CM

## 2018-03-11 DIAGNOSIS — Z23 Encounter for immunization: Secondary | ICD-10-CM | POA: Diagnosis not present

## 2018-03-11 LAB — BASIC METABOLIC PANEL
Anion gap: 8 (ref 5–15)
BUN: 15 mg/dL (ref 6–20)
CALCIUM: 8.5 mg/dL — AB (ref 8.9–10.3)
CO2: 35 mmol/L — AB (ref 22–32)
CREATININE: 0.93 mg/dL (ref 0.44–1.00)
Chloride: 98 mmol/L (ref 98–111)
GFR calc non Af Amer: >60 mL/min (ref >60)
Glucose, Bld: 118 mg/dL — ABNORMAL HIGH (ref 70–99)
Potassium: 3.7 mmol/L (ref 3.5–5.1)
Sodium: 141 mmol/L (ref 135–145)

## 2018-03-11 LAB — RESPIRATORY PANEL BY PCR
ADENOVIRUS-RVPPCR: NOT DETECTED
Bordetella pertussis: NOT DETECTED
CORONAVIRUS 229E-RVPPCR: NOT DETECTED
CORONAVIRUS HKU1-RVPPCR: NOT DETECTED
CORONAVIRUS NL63-RVPPCR: NOT DETECTED
CORONAVIRUS OC43-RVPPCR: NOT DETECTED
Chlamydophila pneumoniae: NOT DETECTED
Influenza A: NOT DETECTED
Influenza B: NOT DETECTED
METAPNEUMOVIRUS-RVPPCR: NOT DETECTED
Mycoplasma pneumoniae: NOT DETECTED
PARAINFLUENZA VIRUS 1-RVPPCR: NOT DETECTED
PARAINFLUENZA VIRUS 2-RVPPCR: NOT DETECTED
PARAINFLUENZA VIRUS 3-RVPPCR: NOT DETECTED
Parainfluenza Virus 4: NOT DETECTED
RHINOVIRUS / ENTEROVIRUS - RVPPCR: NOT DETECTED
Respiratory Syncytial Virus: NOT DETECTED

## 2018-03-11 LAB — ECHOCARDIOGRAM COMPLETE
HEIGHTINCHES: 59 in
Weight: 3537.6 oz

## 2018-03-11 LAB — HIV ANTIBODY (ROUTINE TESTING W REFLEX): HIV SCREEN 4TH GENERATION: NONREACTIVE

## 2018-03-11 LAB — PROCALCITONIN

## 2018-03-11 MED ORDER — METHYLPREDNISOLONE SODIUM SUCC 125 MG IJ SOLR
60.0000 mg | Freq: Two times a day (BID) | INTRAMUSCULAR | Status: DC
  Administered 2018-03-11 – 2018-03-12 (×2): 60 mg via INTRAVENOUS
  Filled 2018-03-11 (×2): qty 2

## 2018-03-11 MED ORDER — IPRATROPIUM-ALBUTEROL 0.5-2.5 (3) MG/3ML IN SOLN
3.0000 mL | Freq: Four times a day (QID) | RESPIRATORY_TRACT | Status: DC
  Administered 2018-03-11: 3 mL via RESPIRATORY_TRACT
  Filled 2018-03-11 (×2): qty 3

## 2018-03-11 MED ORDER — DM-GUAIFENESIN ER 30-600 MG PO TB12
2.0000 | ORAL_TABLET | Freq: Two times a day (BID) | ORAL | Status: DC
  Administered 2018-03-12 – 2018-03-13 (×2): 2 via ORAL
  Filled 2018-03-11 (×4): qty 2

## 2018-03-11 MED ORDER — SODIUM CHLORIDE 3 % IN NEBU
4.0000 mL | INHALATION_SOLUTION | Freq: Three times a day (TID) | RESPIRATORY_TRACT | Status: DC
  Administered 2018-03-11 – 2018-03-13 (×5): 4 mL via RESPIRATORY_TRACT
  Filled 2018-03-11 (×7): qty 4

## 2018-03-11 MED ORDER — ALBUTEROL SULFATE (2.5 MG/3ML) 0.083% IN NEBU: 2.5000 mg | INHALATION_SOLUTION | RESPIRATORY_TRACT | Status: DC | PRN

## 2018-03-11 MED ORDER — PNEUMOCOCCAL VAC POLYVALENT 25 MCG/0.5ML IJ INJ
0.5000 mL | INJECTION | INTRAMUSCULAR | Status: AC
  Administered 2018-03-12: 0.5 mL via INTRAMUSCULAR
  Filled 2018-03-11: qty 0.5

## 2018-03-11 MED ORDER — PERFLUTREN LIPID MICROSPHERE
1.0000 mL | INTRAVENOUS | Status: AC | PRN
  Administered 2018-03-11: 2 mL via INTRAVENOUS
  Filled 2018-03-11: qty 10

## 2018-03-11 NOTE — Progress Notes (Addendum)
PROGRESS NOTE  Meredith Diaz:[id number] DOB: 1980/11/29 DOA: 03/10/2018 PCP: Myrlene Broker, MD  HPI/Recap of past 24 hours: Meredith Diaz is a 37 y.o. female with a history of asthma, OSA on nocturnal oxygen, not compliant with CPAP, morbid obesity, chronic combined CHF, depression and anxiety who presented on the advice of her PCP for increasing dyspnea on exertion. She's been experiencing general dyspnea worse with exertion associated with chest tightness for several months, at time has been improved by taking extra doses of torsemide, but states this worsened over the past week. She's also noticed some R > L leg swelling. B/L LE duplex US negative for DVT. Negative procalcitonin. resp viral panel pending. 2D echo revealed improved LVEF.  03/11/18: seen and examined at her bedside. Still short of breath on ambulation. No chest pain. Suspect an asthma exacerbation.   Assessment/Plan: Principal Problem:   Acute combined systolic (congestive) and diastolic (congestive) heart failure (HCC) Active Problems:   Hypothyroidism   Asthma, chronic   Essential hypertension   OSA on CPAP   Elevated WBC count   Morbid obesity (HCC)   Acute on chronic hypoxic respiratory failure suspect 2/2 to acute asthma exacerbation vs acute on chronic combined CHF On 2L O2 nocturnally at home Maintain O2 sat >92% C/w duonebs q6h and q2h prn C/w pulmonary toilet w hypersaline nebs and mucinex 1200 mg BID Add IV steroids  Acute on chronic combined CHF C/w diuretics C/w I&O and daily weight 2D echo done today revealed normal LVEF 50-55% C/w cardiac meds  Asthma  C/w home meds Start IV solumedrol 60 mg BID ISS for hyperglycemia  OSA CPAP qhs  Depression/anxiety C/w home meds  Elevated D-dimer  Negative PE workup  Hypothyroidism C/w levothyroxine  Risks: High risk for decompensation due to acute on chronic respiratory failure in the setting of underlying asthma, suspected acute  exacerbation of heart failure and acute exacerbation of asthma. Will need at least 2 midnights for further assessment and treatment of present condition.   Code Status: full  Family Communication: none at bedside   Disposition Plan: Home in 1-2 days   Consultants:  none  Procedures:  none   Antimicrobials:  none  DVT prophylaxis:  sq daily lovenox    Objective: Vitals:   03/11/18 0604 03/11/18 0739 03/11/18 1011 03/11/18 1420  BP: 106/81  137/73 119/75  Pulse: 89  86 93  Resp: 18  18 18   Temp: 98.3 F (36.8 C)   98.4 F (36.9 C)  TempSrc: Oral   Oral  SpO2: 98% 98% 97% 97%  Weight: 100.3 kg     Height:        Intake/Output Summary (Last 24 hours) at 03/11/2018 1742 Last data filed at 03/11/2018 1420 Gross per 24 hour  Intake 240 ml  Output 2825 ml  Net -2585 ml   Filed Weights   03/10/18 1740 03/10/18 1857 03/11/18 0604  Weight: 102.4 kg 101.7 kg 100.3 kg    Exam:  . General: 37 y.o. year-old female well developed well nourished in no acute distress.  Alert and oriented x3. . Cardiovascular: Regular rate and rhythm with no rubs or gallops.  No thyromegaly or JVD noted.   [name] Respiratory: diffused wheezes bilaterally. Good inspiratory effort. . Abdomen: Soft nontender nondistended with normal bowel sounds x4 quadrants. . Musculoskeletal: Trace lower extremity edema. 2/4 pulses in all 4 extremities. [name] Psychiatry: Mood is appropriate for condition and setting   Data Reviewed: CBC: Recent Labs  Lab 03/10/18 1020  WBC 13.0*  HGB 13.7  HCT 45.6  MCV 97.2  PLT 340   Basic Metabolic Panel: Recent Labs  Lab 03/10/18 1020 03/11/18 0441  NA 142 141  K 3.4* 3.7  CL 103 98  CO2 34* 35*  GLUCOSE 98 118*  BUN 16 15  CREATININE 0.87 0.93  CALCIUM 9.0 8.5*   GFR: Estimated Creatinine Clearance: 85.5 mL/min (by C-G formula based on SCr of 0.93 mg/dL). Liver Function Tests: No results for input(s): AST, ALT, ALKPHOS, BILITOT, PROT, ALBUMIN in the  last 168 hours. No results for input(s): LIPASE, AMYLASE in the last 168 hours. No results for input(s): AMMONIA in the last 168 hours. Coagulation Profile: No results for input(s): INR, PROTIME in the last 168 hours. Cardiac Enzymes: No results for input(s): CKTOTAL, CKMB, CKMBINDEX, TROPONINI in the last 168 hours. BNP (last 3 results) Recent Labs    04/08/17 1350 11/12/17 1131  PROBNP 42.0 118.0*   HbA1C: No results for input(s): HGBA1C in the last 72 hours. CBG: No results for input(s): GLUCAP in the last 168 hours. Lipid Profile: No results for input(s): CHOL, HDL, LDLCALC, TRIG, CHOLHDL, LDLDIRECT in the last 72 hours. Thyroid Function Tests: No results for input(s): TSH, T4TOTAL, FREET4, T3FREE, THYROIDAB in the last 72 hours. Anemia Panel: No results for input(s): VITAMINB12, FOLATE, FERRITIN, TIBC, IRON, RETICCTPCT in the last 72 hours. Urine analysis:    Component Value Date/Time   COLORURINE AMBER (A) 01/28/2015 0315   APPEARANCEUR CLOUDY (A) 01/28/2015 0315   LABSPEC 1.023 01/28/2015 0315   PHURINE 5.0 01/28/2015 0315   GLUCOSEU NEGATIVE 01/28/2015 0315   HGBUR NEGATIVE 01/28/2015 0315   BILIRUBINUR SMALL (A) 01/28/2015 0315   KETONESUR 15 (A) 01/28/2015 0315   PROTEINUR 100 (A) 01/28/2015 0315   UROBILINOGEN 1.0 01/28/2015 0315   NITRITE NEGATIVE 01/28/2015 0315   LEUKOCYTESUR NEGATIVE 01/28/2015 0315   Sepsis Labs: @LABRCNTIP (procalcitonin:4,lacticidven:4)  )No results found for this or any previous visit (from the past 240 hour(s)).    Studies: No results found.  Scheduled Meds: . DULoxetine  60 mg Oral Daily  . enoxaparin (LOVENOX) injection  40 mg Subcutaneous Q24H  . furosemide  80 mg Intravenous Daily  . irbesartan  37.5 mg Oral Daily  . levothyroxine  125 mcg Oral QHS  . metoprolol succinate  25 mg Oral QHS  . mometasone-formoterol  2 puff Inhalation BID  . [START ON 03/12/2018] pneumococcal 23 valent vaccine  0.5 mL Intramuscular  Tomorrow-1000  . sodium chloride flush  3 mL Intravenous Q12H  . spironolactone  25 mg Oral Daily    Continuous Infusions: . sodium chloride       LOS: 0 days     Darlin Drop, MD [facility] Pager [phone]  If 7PM-7AM, please contact night-coverage [email or web address] Password [email or web address] 03/11/2018, 5:42 PM

## 2018-03-11 NOTE — Care Management Note (Signed)
Case Management Note  Patient Details  Name: Meredith Diaz MRN: [id number] Date of Birth: [birth year]  Subjective/Objective:     CHF              Action/Plan: Patient lives at home with spouse; PCP: Myrlene Broker, MD; has private insurance with [facility] with prescription drug coverage; CM will continue to follow for progression of care.  Expected Discharge Date:     Possibly 03/14/2018             Expected Discharge Plan:  Home/Self Care  Discharge planning Services  CM Consult  Status of Service:  In process, will continue to follow  Reola Mosher [phone] 03/11/2018, 10:28 AM

## 2018-03-11 NOTE — Plan of Care (Signed)
  Problem: Education: Goal: Knowledge of General Education information will improve Description Including pain rating scale, medication(s)/side effects and non-pharmacologic comfort measures Outcome: Progressing   Problem: Health Behavior/Discharge Planning: Goal: Ability to manage health-related needs will improve Outcome: Progressing   Problem: Clinical Measurements: Goal: Cardiovascular complication will be avoided Outcome: Progressing   Problem: Activity: Goal: Risk for activity intolerance will decrease Outcome: Progressing   Problem: Nutrition: Goal: Adequate nutrition will be maintained Outcome: Progressing   Problem: Coping: Goal: Level of anxiety will decrease Outcome: Progressing   Problem: Elimination: Goal: Will not experience complications related to bowel motility Outcome: Progressing Goal: Will not experience complications related to urinary retention Outcome: Progressing   Problem: Pain Managment: Goal: General experience of comfort will improve Outcome: Progressing   Problem: Safety: Goal: Ability to remain free from injury will improve Outcome: Progressing   Problem: Skin Integrity: Goal: Risk for impaired skin integrity will decrease Outcome: Progressing   

## 2018-03-11 NOTE — Progress Notes (Signed)
*  Preliminary Results* Bilateral lower extremity venous duplex completed. Bilateral lower extremities are negative for deep vein thrombosis. There is no evidence of Baker's cyst bilaterally.  03/11/2018 8:43 AM Gertie Fey, MHA, RVT, RDCS, RDMS

## 2018-03-11 NOTE — Progress Notes (Signed)
  Echocardiogram 2D Echocardiogram has been performed.  Meredith Diaz 03/11/2018, 8:45 AM

## 2018-03-11 NOTE — Plan of Care (Signed)
  Problem: Activity: Goal: Capacity to carry out activities will improve Outcome: Progressing   Problem: Education: Goal: Ability to demonstrate management of disease process will improve Outcome: Progressing   Problem: Education: Goal: Ability to verbalize understanding of medication therapies will improve Outcome: Progressing   

## 2018-03-12 LAB — BASIC METABOLIC PANEL
ANION GAP: 7 (ref 5–15)
BUN: 17 mg/dL (ref 6–20)
CO2: 34 mmol/L — ABNORMAL HIGH (ref 22–32)
Calcium: 9.2 mg/dL (ref 8.9–10.3)
Chloride: 97 mmol/L — ABNORMAL LOW (ref 98–111)
Creatinine, Ser: 0.82 mg/dL (ref 0.44–1.00)
GFR calc non Af Amer: >60 mL/min (ref >60)
Glucose, Bld: 170 mg/dL — ABNORMAL HIGH (ref 70–99)
Potassium: 4.2 mmol/L (ref 3.5–5.1)
SODIUM: 138 mmol/L (ref 135–145)

## 2018-03-12 MED ORDER — PREDNISONE 20 MG PO TABS: 20.0000 mg | ORAL_TABLET | Freq: Every day | ORAL | 0 refills | Status: DC

## 2018-03-12 MED ORDER — PREDNISONE 20 MG PO TABS: 20.0000 mg | ORAL_TABLET | Freq: Every day | ORAL | Status: DC

## 2018-03-12 MED ORDER — FUROSEMIDE 10 MG/ML IJ SOLN
60.0000 mg | Freq: Two times a day (BID) | INTRAMUSCULAR | Status: DC
  Administered 2018-03-12: 60 mg via INTRAVENOUS
  Filled 2018-03-12: qty 6

## 2018-03-12 MED ORDER — METHYLPREDNISOLONE SODIUM SUCC 125 MG IJ SOLR
60.0000 mg | Freq: Two times a day (BID) | INTRAMUSCULAR | Status: DC
  Administered 2018-03-12 – 2018-03-13 (×2): 60 mg via INTRAVENOUS
  Filled 2018-03-12 (×2): qty 2

## 2018-03-12 MED ORDER — IPRATROPIUM-ALBUTEROL 0.5-2.5 (3) MG/3ML IN SOLN: 3.0000 mL | Freq: Two times a day (BID) | RESPIRATORY_TRACT | 0 refills | Status: DC | PRN

## 2018-03-12 MED ORDER — METOPROLOL SUCCINATE ER 25 MG PO TB24: 25.0000 mg | ORAL_TABLET | Freq: Every day | ORAL | 0 refills | Status: DC

## 2018-03-12 MED ORDER — IPRATROPIUM-ALBUTEROL 0.5-2.5 (3) MG/3ML IN SOLN
3.0000 mL | Freq: Three times a day (TID) | RESPIRATORY_TRACT | Status: DC
  Administered 2018-03-12 – 2018-03-13 (×5): 3 mL via RESPIRATORY_TRACT
  Filled 2018-03-12 (×5): qty 3

## 2018-03-12 MED ORDER — DM-GUAIFENESIN ER 30-600 MG PO TB12: 2.0000 | ORAL_TABLET | Freq: Two times a day (BID) | ORAL | 0 refills | Status: DC

## 2018-03-12 NOTE — Discharge Instructions (Addendum)
Heart Failure Action Plan A heart failure action plan helps you understand what to do when you have symptoms of heart failure. Follow the plan that was created by you and your health care provider. Review your plan each time you visit your health care provider. Red zone These signs and symptoms mean you should get medical help right away:  You have trouble breathing when resting.  You have a dry cough that is getting worse.  You have swelling or pain in your legs or abdomen that is getting worse.  You suddenly gain more than 2-3 lb (0.9-1.4 kg) in a day, or more than 5 lb (2.3 kg) in one week. This amount may be more or less depending on your condition.  You have trouble staying awake or you feel confused.  You have chest pain.  You do not have an appetite.  You pass out.  If you experience any of these symptoms:  Call your local emergency services (911 in the [place] right away or seek help at the emergency department of the nearest hospital.  Yellow zone These signs and symptoms mean your condition may be getting worse and you should make some changes:  You have trouble breathing when you are active or you need to sleep with extra pillows.  You have swelling in your legs or abdomen.  You gain 2-3 lb (0.9-1.4 kg) in one day, or 5 lb (2.3 kg) in one week. This amount may be more or less depending on your condition.  You get tired easily.  You have trouble sleeping.  You have a dry cough.  If you experience any of these symptoms:  Contact your health care provider within the next day.  Your health care provider may adjust your medicines.  Green zone These signs mean you are doing well and can continue what you are doing:  You do not have shortness of breath.  You have very little swelling or no new swelling.  Your weight is stable (no gain or loss).  You have a normal activity level.  You do not have chest pain or any other new symptoms.  Follow these  instructions at home:  Take over-the-counter and prescription medicines only as told by your health care provider.  Weigh yourself daily. Your target weight is __________ lb (__________ kg). ? Call your health care provider if you gain more than __________ lb (__________ kg) in a day, or more than __________ lb (__________ kg) in one week.  Eat a heart-healthy diet. Work with a diet and nutrition specialist (dietitian) to create an eating plan that is best for you.  Keep all follow-up visits as told by your health care provider. This is important. Where to find more information:  American Heart Association: www.heart.org Summary  Follow the action plan that was created by you and your health care provider.  Get help right away if you have any symptoms in the Red zone. This information is not intended to replace advice given to you by your health care provider. Make sure you discuss any questions you have with your health care provider. Document Released: 06/02/2016 Document Revised: 06/02/2016 Document Reviewed: 06/02/2016 Elsevier Interactive Patient Education  2018 [employer]. Asthma, Adult Asthma is a condition of the lungs in which the airways tighten and narrow. Asthma can make it hard to breathe. Asthma cannot be cured, but medicine and lifestyle changes can help control it. Asthma may be started (triggered) by:  Animal skin flakes (dander).  Dust.  Cockroaches.  Pollen.  Mold.  Smoke.  Cleaning products.  Hair sprays or aerosol sprays.  Paint fumes or strong smells.  Cold air, weather changes, and winds.  Crying or laughing hard.  Stress.  Certain medicines or drugs.  Foods, such as dried fruit, potato chips, and sparkling grape juice.  Infections or conditions (colds, flu).  Exercise.  Certain medical conditions or diseases.  Exercise or tiring activities.  Follow these instructions at home:  Take medicine as told by your doctor.  Use a peak  flow meter as told by your doctor. A peak flow meter is a tool that measures how well the lungs are working.  Record and keep track of the peak flow meter's readings.  Understand and use the asthma action plan. An asthma action plan is a written plan for taking care of your asthma and treating your attacks.  To help prevent asthma attacks: ? Do not smoke. Stay away from secondhand smoke. ? Change your heating and air conditioning filter often. ? Limit your use of fireplaces and wood stoves. ? Get rid of pests (such as roaches and mice) and their droppings. ? Throw away plants if you see mold on them. ? Clean your floors. Dust regularly. Use cleaning products that do not smell. ? Have someone vacuum when you are not home. Use a vacuum cleaner with a HEPA filter if possible. ? Replace carpet with wood, tile, or vinyl flooring. Carpet can trap animal skin flakes and dust. ? Use allergy-proof pillows, mattress covers, and box spring covers. ? Wash bed sheets and blankets every week in hot water and dry them in a dryer. ? Use blankets that are made of polyester or cotton. ? Clean bathrooms and kitchens with bleach. If possible, have someone repaint the walls in these rooms with mold-resistant paint. Keep out of the rooms that are being cleaned and painted. ? Wash hands often. Contact a doctor if:  You have make a whistling sound when breaking (wheeze), have shortness of breath, or have a cough even if taking medicine to prevent attacks.  The colored mucus you cough up (sputum) is thicker than usual.  The colored mucus you cough up changes from clear or white to yellow, green, gray, or bloody.  You have problems from the medicine you are taking such as: ? A rash. ? Itching. ? Swelling. ? Trouble breathing.  You need reliever medicines more than 2-3 times a week.  Your peak flow measurement is still at 50-79% of your personal best after following the action plan for 1 hour.  You have a  fever. Get help right away if:  You seem to be worse and are not responding to medicine during an asthma attack.  You are short of breath even at rest.  You get short of breath when doing very little activity.  You have trouble eating, drinking, or talking.  You have chest pain.  You have a fast heartbeat.  Your lips or fingernails start to turn blue.  You are light-headed, dizzy, or faint.  Your peak flow is less than 50% of your personal best. This information is not intended to replace advice given to you by your health care provider. Make sure you discuss any questions you have with your health care provider. Document Released: 10/10/2007 Document Revised: 09/29/2015 Document Reviewed: 11/20/2012 Elsevier Interactive Patient Education  2017 [employer] Inc[employer]   Asthma Attack Prevention, Adult Although you may not be able to control the fact that you have asthma,  you can take actions to prevent episodes of asthma (asthma attacks). These actions include:  Creating a written plan for managing and treating your asthma attacks (asthma action plan).  Monitoring your asthma.  Avoiding things that can irritate your airways or make your asthma symptoms worse (asthma triggers).  Taking your medicines as directed.  Acting quickly if you have signs or symptoms of an asthma attack.  What are some ways to prevent an asthma attack? Create a plan Work with your health care provider to create an asthma action plan. This plan should include:  A list of your asthma triggers and how to avoid them.  A list of symptoms that you experience during an asthma attack.  Information about when to take medicine and how much medicine to take.  Information to help you understand your peak flow measurements.  Contact information for your health care providers.  Daily actions that you can take to control asthma.  Monitor your asthma  To monitor your asthma:  Use your peak flow meter every  morning and every evening for 2-3 weeks. Record the results in a journal. A drop in your peak flow numbers on one or more days may mean that you are starting to have an asthma attack, even if you are not having symptoms.  When you have asthma symptoms, write them down in a journal.  Avoid asthma triggers  Work with your health care provider to find out what your asthma triggers are. This can be done by:  Being tested for allergies.  Keeping a journal that notes when asthma attacks occur and what may have contributed to them.  Asking your health care provider whether other medical conditions make your asthma worse.  Common asthma triggers include:  Dust.  Smoke. This includes campfire smoke and secondhand smoke from tobacco products.  Pet dander.  Trees, grasses or pollens.  Very cold, dry, or humid air.  Mold.  Foods that contain high amounts of sulfites.  Strong smells.  Engine exhaust and air pollution.  Aerosol sprays and fumes from household cleaners.  Household pests and their droppings, including dust mites and cockroaches.  Certain medicines, including NSAIDs.  Once you have determined your asthma triggers, take steps to avoid them. Depending on your triggers, you may be able to reduce the chance of an asthma attack by:  Keeping your home clean. Have someone dust and vacuum your home for you 1 or 2 times a week. If possible, have them use a high-efficiency particulate arrestance (HEPA) vacuum.  Washing your sheets weekly in hot water.  Using allergy-proof mattress covers and casings on your bed.  Keeping pets out of your home.  Taking care of mold and water problems in your home.  Avoiding areas where people smoke.  Avoiding using strong perfumes or odor sprays.  Avoid spending a lot of time outdoors when pollen counts are high and on very windy days.  Talking with your health care provider before stopping or starting any new  medicines.  Medicines Take over-the-counter and prescription medicines only as told by your health care provider. Many asthma attacks can be prevented by carefully following your medicine schedule. Taking your medicines correctly is especially important when you cannot avoid certain asthma triggers. Even if you are doing well, do not stop taking your medicine and do not take less medicine. Act quickly If an asthma attack happens, acting quickly can decrease how severe it is and how long it lasts. Take these actions:  Pay attention  to your symptoms. If you are coughing, wheezing, or having difficulty breathing, do not wait to see if your symptoms go away on their own. Follow your asthma action plan.  If you have followed your asthma action plan and your symptoms are not improving, call your health care provider or seek immediate medical care at the nearest hospital.  It is important to write down how often you need to use your fast-acting rescue inhaler. You can track how often you use an inhaler in your journal. If you are using your rescue inhaler more often, it may mean that your asthma is not under control. Adjusting your asthma treatment plan may help you to prevent future asthma attacks and help you to gain better control of your condition. How can I prevent an asthma attack when I exercise?  Exercise is a common asthma trigger. To prevent asthma attacks during exercise:  Follow advice from your health care provider about whether you should use your fast-acting inhaler before exercising. Many people with asthma experience exercise-induced bronchoconstriction (EIB). This condition often worsens during vigorous exercise in cold, humid, or dry environments. Usually, people with EIB can stay very active by using a fast-acting inhaler before exercising.  Avoid exercising outdoors in very cold or humid weather.  Avoid exercising outdoors when pollen counts are high.  Warm up and cool down when  exercising.  Stop exercising right away if asthma symptoms start.  Consider taking part in exercises that are less likely to cause asthma symptoms such as:  Indoor swimming.  Biking.  Walking.  Hiking.  Playing football.  This information is not intended to replace advice given to you by your health care provider. Make sure you discuss any questions you have with your health care provider. Document Released: 04/11/2009 Document Revised: 12/23/2015 Document Reviewed: 10/08/2015 Elsevier Interactive Patient Education  2018 [employer] Inc[employer]   Asthma Action Plan, Adult Introduction Name: ________________________________ Date: _______ Follow-Up Visit With Health Care Provider Bring your medicines to your follow-up visits.  Health care provider name: ____________________  Telephone: ____________________  How often should I see my health care provider? ____________________  The actions that you should take to control your asthma are based on the symptoms that you are having. The condition can be divided into 3 zones: the green zone, yellow zone, and red zone. Follow the action steps for the zone that you are in each day. Green zone: when asthma is under control  Signs and symptoms You may not have any symptoms while you are in the green zone. This means that you:  Have no coughing or wheezing, even while you are working or playing.  Sleep through the night.  Are breathing well.  If you use a peak flow meter: The peak flow is above ______ (80% of your personal best or greater). You should take these medicines every day:  Controller medicine and dosage: ________________  Controller medicine and dosage: ________________  Controller medicine and dosage: ________________  Controller medicine and dosage: ________________  Before exercise, use this reliever or rescue medicine: ________________  Call your health care provider if:  You are using a reliever or rescue  medicine more than 2-3 times per week.  Yellow zone: when asthma is getting worse  Signs and symptoms When you are in the yellow zone, you may have symptoms that interfere with exercise, are noticeably worse after exposure to triggers, or are worse at the first sign of a cold (upper respiratory infection). These may include:  Waking from  sleep.  Coughing, especially at night or first thing in the morning.  Mild wheezing.  Chest tightness.  If you use a peak flow meter: The peak flow is _____ to _____ (50-79% of your personal best). Add the following medicine to the ones that you use daily:  Reliever or rescue medicine and dosage: ________________  Additional medicine and dosage: ________________  Call your health care provider if:  You are using a reliever or rescue medicine more than 2-3 times per week.  You remain in the yellow zone for _____ hours.  Red zone: when asthma is severe  Signs and symptoms You will likely feel distressed and have symptoms at rest that restrict your activity. You are in the red zone if:  Your breathing is hard and quickly.  Your nose opens wide, your ribs show, and your neck muscles become visible when you breathe in.  Your lips, fingers, or toes are a bluish color.  You have trouble speaking in full sentences.  Your symptoms do not improve within 15-20 minutes after you use your reliever or rescue medicine (bronchodilator).  If you use a peak flow meter: The peak flow is less than _____ (less than 50% of your personal best). Call your local emergency services (911 in the [place] right away or seek help at the emergency department of the nearest hospital. Use your reliever or rescue medicine.  Start a nebulizer treatment or take 2-4 puffs from a metered-dose inhaler with a spacer.  Repeat this action every 15-20 minutes until help arrives.  What are some common asthma triggers? Discuss your asthma triggers with your health care  provider. Some common triggers are:  Dander from the skin, hair, or feathers of animals.  Dust mites.  Cockroaches.  Pollen from trees or grass.  Mold.  Cigarette or tobacco smoke.  Air pollutants, such as dust, household cleaners, hair sprays, aerosol sprays, scented candles, paint fumes, strong chemicals, or strong odors.  Cold air or changes in weather. Cold air may cause inflammation. Winds increase molds and pollens in the air.  Strong emotions, such as crying or laughing hard.  Stress.  Certain medicines, such as aspirin or beta blockers.  Sulfites in foods and drinks, such as dried fruits and wine.  Infections or inflammatory conditions, such as: ? Flu (influenza). ? Upper respiratory tract infection. ? Lower respiratory tract infection (pneumonia or bronchitis). ? Inflammation of the nasal membranes (rhinitis).  Gastroesophageal reflux disease (GERD). GERD is a condition in which stomach acid comes up into the throat (esophagus).  Exercise or strenuous activity.  This information is not intended to replace advice given to you by your health care provider. Make sure you discuss any questions you have with your health care provider. Document Released: 02/18/2009 Document Revised: 12/19/2015 Document Reviewed: 02/09/2014 Elsevier Interactive Patient Education  2018 [employer]   Cough, Adult A cough helps to clear your throat and lungs. A cough may last only 2-3 weeks (acute), or it may last longer than 8 weeks (chronic). Many different things can cause a cough. A cough may be a sign of an illness or another medical condition. Follow these instructions at home:  Pay attention to any changes in your cough.  Take medicines only as told by your doctor. ? If you were prescribed an antibiotic medicine, take it as told by your doctor. Do not stop taking it even if you start to feel better. ? Talk with your doctor before you try using a cough medicine.  Drink  enough fluid to keep your pee (urine) clear or pale yellow.  If the air is dry, use a cold steam vaporizer or humidifier in your home.  Stay away from things that make you cough at work or at home.  If your cough is worse at night, try using extra pillows to raise your head up higher while you sleep.  Do not smoke, and try not to be around smoke. If you need help quitting, ask your doctor.  Do not have caffeine.  Do not drink alcohol.  Rest as needed. Contact a doctor if:  You have new problems (symptoms).  You cough up yellow fluid (pus).  Your cough does not get better after 2-3 weeks, or your cough gets worse.  Medicine does not help your cough and you are not sleeping well.  You have pain that gets worse or pain that is not helped with medicine.  You have a fever.  You are losing weight and you do not know why.  You have night sweats. Get help right away if:  You cough up blood.  You have trouble breathing.  Your heartbeat is very fast. This information is not intended to replace advice given to you by your health care provider. Make sure you discuss any questions you have with your health care provider. Document Released: 01/04/2011 Document Revised: 09/29/2015 Document Reviewed: 06/30/2014 Elsevier Interactive Patient Education  2018 [employer]   Heart Failure Action Plan A heart failure action plan helps you understand what to do when you have symptoms of heart failure. Follow the plan that was created by you and your health care provider. Review your plan each time you visit your health care provider. Red zone These signs and symptoms mean you should get medical help right away:  You have trouble breathing when resting.  You have a dry cough that is getting worse.  You have swelling or pain in your legs or abdomen that is getting worse.  You suddenly gain more than 2-3 lb (0.9-1.4 kg) in a day, or more than 5 lb (2.3 kg) in one week. This amount may  be more or less depending on your condition.  You have trouble staying awake or you feel confused.  You have chest pain.  You do not have an appetite.  You pass out.  If you experience any of these symptoms:  Call your local emergency services (911 in the [place] right away or seek help at the emergency department of the nearest hospital.  Yellow zone These signs and symptoms mean your condition may be getting worse and you should make some changes:  You have trouble breathing when you are active or you need to sleep with extra pillows.  You have swelling in your legs or abdomen.  You gain 2-3 lb (0.9-1.4 kg) in one day, or 5 lb (2.3 kg) in one week. This amount may be more or less depending on your condition.  You get tired easily.  You have trouble sleeping.  You have a dry cough.  If you experience any of these symptoms:  Contact your health care provider within the next day.  Your health care provider may adjust your medicines.  Green zone These signs mean you are doing well and can continue what you are doing:  You do not have shortness of breath.  You have very little swelling or no new swelling.  Your weight is stable (no gain or loss).  You have a normal activity level.  You do not have chest pain or any other new symptoms.  Follow these instructions at home:  Take over-the-counter and prescription medicines only as told by your health care provider.  Weigh yourself daily. Your target weight is __________ lb (__________ kg). ? Call your health care provider if you gain more than __________ lb (__________ kg) in a day, or more than __________ lb (__________ kg) in one week.  Eat a heart-healthy diet. Work with a diet and nutrition specialist (dietitian) to create an eating plan that is best for you.  Keep all follow-up visits as told by your health care provider. This is important. Where to find more information:  American Heart Association:  www.heart.org Summary  Follow the action plan that was created by you and your health care provider.  Get help right away if you have any symptoms in the Red zone. This information is not intended to replace advice given to you by your health care provider. Make sure you discuss any questions you have with your health care provider. Document Released: 06/02/2016 Document Revised: 06/02/2016 Document Reviewed: 06/02/2016 Elsevier Interactive Patient Education  2018 [employer]   Heart Failure Eating Plan Heart failure, also called congestive heart failure, occurs when your heart does not pump blood well enough to meet your body's needs for oxygen-rich blood. Heart failure is a long-term (chronic) condition. Living with heart failure can be challenging. However, following your health care provider's instructions about a healthy lifestyle and working with a diet and nutrition specialist (dietitian) to choose the right foods may help to improve your symptoms. What are tips for following this plan? General guidelines  Do not eat more than 2,300 mg of salt (sodium) a day. The amount of sodium that is recommended for you may be lower, depending on your condition.  Maintain a healthy body weight as directed. Ask your health care provider what a healthy weight is for you. ? Check your weight every day. ? Work with your health care provider and dietitian to make a plan that is right for you to lose weight or maintain your current weight.  Limit how much fluid you drink. Ask your health care provider or dietitian how much fluid you can have each day.  Limit or avoid alcohol as told by your health care provider or dietitian. Reading food labels  Check food labels for the amount of sodium per serving. Choose foods that have less than 140 mg (milligrams) of sodium in each serving.  Check food labels for the number of calories per serving. This is important if you need to limit your daily calorie  intake to lose weight.  Check food labels for the serving size. If you eat more than one serving, you will be eating more sodium and calories than what is listed on the label.  Look for foods that are labeled as "sodium-free," "very low sodium," or "low sodium." ? Foods labeled as "reduced sodium" or "lightly salted" may still have more sodium than what is recommended for you. Cooking  Avoid adding salt when cooking. Ask your health care provider or dietitian before using salt substitutes.  Season food with salt-free seasonings, spices, or herbs. Check the label of seasoning mixes to make sure they do not contain salt.  Cook with heart-healthy oils, such as olive, canola, soybean, or sunflower oil.  Do not fry foods. Cook foods using low-fat methods, such as baking, boiling, grilling, and broiling.  Limit unhealthy fats when cooking by: ? Removing the skin  from poultry, such as chicken. ? Removing all visible fats from meats. ? Skimming the fat off from stews, soups, and gravies before serving them. Meal planning  Limit your intake of: ? Processed, canned, or pre-packaged foods. ? Foods that are high in trans fat, such as fried foods. ? Sweets, desserts, sugary drinks, and other foods with added sugar. ? Full-fat dairy products, such as whole milk.  Eat a balanced diet that includes: ? 4-5 servings of fruit each day and 4-5 servings of vegetables each day. At each meal, try to fill half of your plate with fruits and vegetables. ? Up to 6-8 servings of whole grains each day. ? Up to 2 servings of lean meat, poultry, or fish each day. One serving of meat is equal to 3 oz. This is about the same size as a deck of cards. ? 2 servings of low-fat dairy each day. ? Heart-healthy fats. Healthy fats called omega-3 fatty acids are found in foods such as flaxseed and cold-water fish like sardines, salmon, and mackerel.  Aim to eat 25-35 g (grams) of fiber a day. Foods that are high in fiber  include apples, broccoli, carrots, beans, peas, and whole grains.  Do not add salt or condiments that contain salt (such as soy sauce) to foods before eating.  When eating at a restaurant, ask that your food be prepared with less salt or no salt, if possible.  Try to eat 2 or more vegetarian meals each week.  Eat more home-cooked food and eat less restaurant, buffet, and fast food. Recommended foods The items listed may not be a complete list. Talk with your dietitian about what dietary choices are best for you. Grains Bread with less than 80 mg of sodium per slice. Whole-wheat pasta, quinoa, and brown rice. Oats and oatmeal. Barley. Millet. Grits and cream of wheat. Whole-grain and whole-wheat cold cereal. Vegetables All fresh vegetables. Vegetables that are frozen without sauce or added salt. Low-sodium or sodium-free canned vegetables. Fruits All fresh, frozen, and canned fruits. Dried fruits, such as raisins, prunes, and cranberries. Meats and other protein foods Lean cuts of meat. Skinless chicken and [place]. Fish with high omega-3 fatty acids, such as salmon, sardines, and other cold-water fishes. Eggs. Dried beans, peas, and edamame. Unsalted nuts and nut butters. Dairy Low-fat or nonfat (skim) milk and dried milk. Rice milk, soy milk, and almond milk. Low-fat or nonfat yogurt. Small amounts of reduced-sodium block cheese. Low-sodium cottage cheese. Fats and oils Olive, canola, soybean, flaxseed, or sunflower oil. Avocado. Sweets and desserts Apple sauce. Granola bars. Sugar-free pudding and gelatin. Frozen fruit bars. Seasoning and other foods Fresh and dried herbs. Lemon or lime juice. Vinegar. Low-sodium ketchup. Salt-free marinades, salad dressings, sauces, and seasonings. Foods to avoid The items listed may not be a complete list. Talk with your dietitian about what dietary choices are best for you. Grains Bread with more than 80 mg of sodium per slice. Hot or cold cereal  with more than 140 mg sodium per serving. Salted pretzels and crackers. Pre-packaged breadcrumbs. Bagels, croissants, and biscuits. Vegetables Canned vegetables. Frozen vegetables with sauce or seasonings. Creamed vegetables. [place] fries. Onion rings. Pickled vegetables and sauerkraut. Fruits Fruits that are dried with sodium-containing preservatives. Meats and other protein foods Ribs and chicken wings. Bacon, ham, pepperoni, bologna, salami, and packaged luncheon meats. Hot dogs, bratwurst, and sausage. Canned meat. Smoked meat and fish. Salted nuts and seeds. Dairy Whole milk, half-and-half, and cream. Buttermilk. Processed cheese, cheese spreads, and  cheese curds. Regular cottage cheese. Feta cheese. Shredded cheese. String cheese. Fats and oils Butter, lard, shortening, ghee, and bacon fat. Canned and packaged gravies. Seasoning and other foods Onion salt, garlic salt, table salt, and sea salt. Marinades. Regular salad dressings. Relishes, pickles, and olives. Meat flavorings and tenderizers, and bouillon cubes. Horseradish, ketchup, and mustard. Worcestershire sauce. Teriyaki sauce, soy sauce (including reduced sodium). Hot sauce and Tabasco sauce. Steak sauce, fish sauce, oyster sauce, and cocktail sauce. Taco seasonings. Barbecue sauce. Tartar sauce. Summary  A heart failure eating plan includes changes that limit your intake of sodium and unhealthy fat, and it may help you lose weight or maintain a healthy weight. Your health care provider may also recommend limiting how much fluid you drink.  Most people with heart failure should eat no more than 2,300 mg of salt (sodium) a day. The amount of sodium that is recommended for you may be lower, depending on your condition.  Contact your health care provider or dietitian before making any major changes to your diet. This information is not intended to replace advice given to you by your health care provider. Make sure you discuss any  questions you have with your health care provider. Document Released: 09/07/2016 Document Revised: 09/07/2016 Document Reviewed: 09/07/2016 Elsevier Interactive Patient Education  2018 [employer]   Living With Heart Failure  Heart failure is a long-term (chronic) condition in which the heart cannot pump enough blood through the body. When this happens, parts of the body do not get the blood and oxygen they need. There is no cure for heart failure at this time, so it is important for you to take good care of yourself and follow the treatment plan set by your health care provider. If you are living with heart failure, there are ways to help you manage the disease. Follow these instructions at home: Living with heart failure requires you to make changes in your life. Your health care team will teach you about the changes you need to make in order to relieve your symptoms and lower your risk of going to the hospital. Follow the treatment plan as set by your health care provider. Medicines Medicines are important in reducing your heart's workload, slowing the progression of heart failure, and improving your symptoms.  Take over-the-counter and prescription medicines only as told by your health care provider.  Do not stop taking your medicine unless your health care provider tells you to do that.  Do not skip any dose of your medicine.  Refill prescriptions before you run out of medicine. You need your medicines every day.  Eating and drinking  Eat heart-healthy foods. Talk with a dietitian to make an eating plan that is right for you. ? If directed by your health care provider: ? Limit salt (sodium). Lowering your sodium intake may reduce symptoms of heart failure. Ask a dietitian to recommend heart-healthy seasonings. ? Limit your fluid intake. Fluid restriction may reduce symptoms of heart failure. ? Use low-fat cooking methods instead of frying. Low-fat methods include roasting,  grilling, broiling, baking, poaching, steaming, and stir-frying. ? Choose foods that contain no trans fat and are low in saturated fat and cholesterol. Healthy choices include fresh or frozen fruits and vegetables, fish, lean meats, legumes, fat-free or low-fat dairy products, and whole-grain or high-fiber foods.  Limit alcohol intake to no more than 1 drink a day for nonpregnant women and 2 drinks a day for men. One drink equals 12 oz of beer, 5  oz of wine, or 1 oz of hard liquor. ? Drinking more than that is harmful to your heart. Tell your health care provider if you drink alcohol several times a week. ? Talk with your health care provider about whether any level of alcohol use is safe for you. Activity  Ask your health care provider about attending cardiac rehabilitation. These programs include aerobic physical activity, which provides many benefits for your heart.  If no cardiac rehabilitation program is available, ask your health care provider what aerobic exercises are safe for you to do. Lifestyle Make the lifestyle changes recommended by your health care provider. In general:  Lose weight if your health care provider tells you to do that. Weight loss may reduce symptoms of heart failure.  Do not use any products that contain nicotine or tobacco, such as cigarettes or e-cigarettes. If you need help quitting, ask your health care provider.  Do not use street (illegal) drugs.  Return to your normal activities as told by your health care provider. Ask your health care provider what activities are safe for you.  General instructions  Make sure you weigh yourself every day to track your weight. Rapid weight gain may indicate an increase in fluid in your body and may increase the workload of your heart. ? Weigh yourself every morning. Do this after you urinate but before you eat breakfast. ? Wear the same type of clothing, without shoes, each time you weigh yourself. ? Weigh yourself on  the same scale and in the same spot each time.  Living with chronic heart failure often leads to emotions such as fear, stress, anxiety, and depression. If you feel any of these emotions and need help coping, contact your health care provider. Other ways to get help include: ? Talking to friends and family members about your condition. They can give you support and guidance. Explain your symptoms to them and, if comfortable, invite them to attend appointments or rehabilitation with you. ? Joining a support group for people with chronic heart failure. Talking with other people who have the same symptoms may give you new ways of coping with your disease and your emotions.  Stay up to date with your shots (vaccines). Staying current on pneumococcal and influenza vaccines is especially important in preventing germs from attacking your airways (respiratory infections).  Keep all follow-up visits as told by your health care provider. This is important. How to recognize changes in your condition You and your family members need to know what changes to watch for in your condition. Watch for the following changes and report them to your health care provider:  Sudden weight gain. Ask your health care provider what amount of weight gain to report.  Shortness of breath: ? Feeling short of breath while at rest, with no exercise or activity that required great effort. ? Feeling breathless with activity.  Swelling of your lower legs or ankles.  Difficulty sleeping: ? You wake up feeling short of breath. ? You have to use more pillows to raise your head in order to sleep.  Frequent, dry, hacking cough.  Loss of appetite.  Feeling more tired all the time.  Depression or feelings of sadness or hopelessness.  Bloating in the stomach.  Where to find more information  Local support groups. Ask your health care provider about groups near you.  The American Heart Association: www.heart.org Contact a  health care provider if:  You have a rapid weight gain.  You have increasing shortness  of breath that is unusual for you.  You are unable to participate in your usual physical activities.  You tire easily.  You cough more than normal, especially with physical activity.  You have any swelling or more swelling in areas such as your hands, feet, ankles, or abdomen.  You feel like your heart is beating quickly (palpitations).  You become dizzy or light-headed when you stand up. Get help right away if:  You have difficulty breathing.  You notice or your family notices a change in your awareness, such as having trouble staying awake or having difficulty with concentration.  You have pain or discomfort in your chest.  You have an episode of fainting (syncope). Summary  There is no cure for heart failure, so it is important for you to take good care of yourself and follow the treatment plan set by your health care provider.  Medicines are important in reducing your heart's workload, slowing the progression of heart failure, and improving your symptoms.  Living with chronic heart failure often leads to emotions such as fear, stress, anxiety, and depression. If you are feeling any of these emotions and need help coping, contact your health care provider. This information is not intended to replace advice given to you by your health care provider. Make sure you discuss any questions you have with your health care provider. Document Released: 09/05/2016 Document Revised: 09/05/2016 Document Reviewed: 09/05/2016 Elsevier Interactive Patient Education  2018 [employer].

## 2018-03-12 NOTE — Discharge Summary (Addendum)
Discharge Summary  Meredith Diaz:[id number] DOB: 12/08/1980  PCP: Myrlene Broker, MD  Admit date: 03/10/2018 Discharge date: 03/12/2018  Time spent: 35 minutes  Recommendations for Outpatient Follow-up:  1. Follow-up with your PCP 2. Follow-up with your cardiologist 3. Take your medications as prescribed  UPDATE: Patient had a Home O2 evaluation done today prior to her discharge and her oxygen level desaturated upon ambulation to the low 80's.   She requires an additional day of hospital care for further diuresis and further treatment with IV steroids to improve her acute on chronic hypoxic respiratory failure. Will reassess her O2 saturation with activity tomorrow 03/13/18.   Discharge Diagnoses:  Active Hospital Problems   Diagnosis Date Noted  . 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 Morbid obesity (HCC) 11/15/2015  . Elevated WBC count 06/17/2015  . OSA on CPAP 10/14/2014  . Essential hypertension 03/09/2014  . Asthma, chronic 01/28/2014  . Hypothyroidism 01/28/2014    Resolved Hospital Problems  No resolved problems to display.    Discharge Condition: Stable  Diet recommendation: Resume previous diet  Vitals:   03/12/18 0740 03/12/18 1215  BP:  103/63  Pulse:  93  Resp:  18  Temp:  98.6 F (37 C)  SpO2: 95% (!) 85%    History of present illness:  Meredith Diaz a 37 y.o.femalewith a history ofasthma, OSA on nocturnal oxygen, not compliant with CPAP, morbid obesity, chronic combined CHF, depression and anxiety who presented on the advice of her PCP for increasing dyspnea on exertion. She's been experiencing general dyspnea worse with exertion associated with chest tightness for several months, at time has been improved by taking extra doses of torsemide, but states this worsened over the past week. She's also noticed some R > L leg swelling. B/L LE duplex US negative for DVT. Negative  procalcitonin. resp viral panel pending. 2D echo revealed improved LVEF.  03/11/18: seen and examined at her bedside. Still short of breath on ambulation. No chest pain.  Improved repeated 2D echo.  Asthma exacerbation suspected.   03/12/2018: Patient seen and examined at bedside.  No acute events overnight.  States her breathing continues to improve.  Cough is less.  Afebrile with negative procalcitonin.  On the day of discharge, the patient was hemodynamically stable.  She will need to follow-up with her PCP and cardiologist posthospitalization.    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) Active Problems:   Hypothyroidism   Asthma, chronic   Essential hypertension   OSA on CPAP   Elevated WBC count   Morbid obesity (HCC)  Acute on chronic hypoxic respiratory failure suspect 2/2 to acute asthma exacerbation vs acute on chronic combined CHF On 2L O2 nocturnally at home Maintain O2 sat >92% C/w duonebs q6h and q2h prn Received 2 doses of IV steroids Continue prednisone 20 mg daily x5 days Follow-up with your PCP post hospitalization  Acute on chronic combined CHF Resume your home diuretic C/w I&O and daily weight 2D echo done on 03/11/2018 revealed improved LVEF with EF 50-55% from 40 to 45% C/w cardiac meds  Suspected asthma exacerbation C/w home meds Completed 2 doses of IV solumedrol 60 mg BID yesterday 03/11/2018 Stop IV Solu-Medrol and switch to oral prednisone 20 mg daily x5 days Follow up with your PCP   OSA Continue CPAP qhs  Depression/anxiety C/w home meds  Elevated D-dimer  Negative PE  workup  Hypothyroidism C/w levothyroxine    Code Status: full   Consultants:  none  Procedures:  none   Antimicrobials:  none  Discharge Exam: BP 103/63 (BP Location: Left Arm)   Pulse 93   Temp 98.6 F (37 C) (Oral)   Resp 18   Ht 4\' 11"  (1.499 m)   Wt 99 kg  Comment: scale a  SpO2 (!) 85%   BMI 44.07 kg/m  . General: 37 y.o. year-old female well developed well nourished in no acute distress.  Alert and oriented x3. . Cardiovascular: Regular rate and rhythm with no rubs or gallops.  No thyromegaly or JVD noted.   [name] Respiratory: Clear to auscultation with no wheezes or rales. Good inspiratory effort. . Abdomen: Soft nontender nondistended with normal bowel sounds x4 quadrants. . Musculoskeletal: No lower extremity edema. 2/4 pulses in all 4 extremities. . Skin: No ulcerative lesions noted or rashes, . Psychiatry: Mood is appropriate for condition and setting  Discharge Instructions You were cared for by a hospitalist during your hospital stay. If you have any questions about your discharge medications or the care you received while you were in the hospital after you are discharged, you can call the unit and asked to speak with the hospitalist on call if the hospitalist that took care of you is not available. Once you are discharged, your primary care physician will handle any further medical issues. Please note that NO REFILLS for any discharge medications will be authorized once you are discharged, as it is imperative that you return to your primary care physician (or establish a relationship with a primary care physician if you do not have one) for your aftercare needs so that they can reassess your need for medications and monitor your lab values.  Discharge Instructions    DME Nebulizer machine   Complete by:  As directed    Patient needs a nebulizer to treat with the following condition:  Asthma exacerbation     Allergies as of 03/12/2018      Reactions   Lisinopril Swelling, Other (See Comments)   Angioedema facial, mostly just lips, no trouble breathing      Medication List    STOP taking these medications   ibuprofen 200 MG tablet Commonly known as:  ADVIL,MOTRIN   metFORMIN 500 MG tablet Commonly known as:  GLUCOPHAGE     traZODone 50 MG tablet Commonly known as:  DESYREL   valsartan 40 MG tablet Commonly known as:  DIOVAN     TAKE these medications   ALPRAZolam 1 MG tablet Commonly known as:  XANAX Take 1 mg by mouth daily as needed for anxiety.   budesonide-formoterol 160-4.5 MCG/ACT inhaler Commonly known as:  SYMBICORT TAKE 2 PUFFS BY MOUTH TWICE A DAY What changed:    how much to take  how to take this  when to take this  additional instructions   clobetasol cream 0.05 % Commonly known as:  TEMOVATE Apply 1 application topically See admin instructions. Apply daily to affected areas of genital area as directed   dextromethorphan-guaiFENesin 30-600 MG 12hr tablet Commonly known as:  MUCINEX DM Take 2 tablets by mouth 2 (two) times daily.   DULoxetine 60 MG capsule Commonly known as:  CYMBALTA TAKE 1 CAPSULE (60 MG TOTAL) BY MOUTH DAILY. What changed:  See the new instructions.   ipratropium-albuterol 0.5-2.5 (3) MG/3ML Soln Commonly known as:  DUONEB Take 3 mLs by nebulization 2 (two) times daily as needed (For wheezing or  shoortness of breath).   levothyroxine 125 MCG tablet Commonly known as:  SYNTHROID, LEVOTHROID Take 1 tablet (125 mcg total) by mouth daily. What changed:  when to take this   metoprolol succinate 25 MG 24 hr tablet Commonly known as:  TOPROL-XL Take 1 tablet (25 mg total) by mouth at bedtime. What changed:    medication strength  See the new instructions.   predniSONE 20 MG tablet Commonly known as:  DELTASONE Take 1 tablet (20 mg total) by mouth daily with breakfast for 5 days.   spironolactone 25 MG tablet Commonly known as:  ALDACTONE TAKE 1 TABLET EVERY DAY   torsemide 20 MG tablet Commonly known as:  DEMADEX Take 2 tablets (40 mg total) by mouth daily.   VENTOLIN HFA 108 (90 Base) MCG/ACT inhaler Generic drug:  albuterol INHALE 1-2 PUFFS INTO THE LUNGS EVERY 6 (SIX) HOURS AS NEEDED FOR WHEEZING OR SHORTNESS OF BREATH. What changed:   See the new instructions.   Vitamin D (Ergocalciferol) 1.25 MG (50000 UT) Caps capsule Commonly known as:  DRISDOL Take 1 capsule (50,000 Units total) by mouth every 7 (seven) days.            Durable Medical Equipment  (From admission, onward)         Start     Ordered   03/12/18 1050  For home use only DME oxygen  Once    Question Answer Comment  Mode or (Route) Nasal cannula   Liters per Minute 2   Frequency Continuous (stationary and portable oxygen unit needed)   Oxygen conserving device No   Oxygen delivery system Gas      03/12/18 1049   03/12/18 0000  DME Nebulizer machine    Question:  Patient needs a nebulizer to treat with the following condition  Answer:  Asthma exacerbation   03/12/18 1033         Allergies  Allergen Reactions  . Lisinopril Swelling and Other (See Comments)    Angioedema facial, mostly just lips, no trouble breathing   Follow-up Information    Myrlene Broker, MD. Go on 03/17/2018.   Specialty:  Internal Medicine Why:  Call for a post hospital follow-up appointment @9 :00am Contact information: [address] [place] [address] [phone]            The results of significant diagnostics from this hospitalization (including imaging, microbiology, ancillary and laboratory) are listed below for reference.    Significant Diagnostic Studies: Dg Chest 2 View  Result Date: 03/10/2018 CLINICAL DATA:  Short of breath EXAM: CHEST - 2 VIEW COMPARISON:  11/12/2017 FINDINGS: Heart size upper normal. Widening of the superior mediastinum most likely mediastinal fat based on prior chest CT of 10/10/2016. Negative for heart failure. Negative for infiltrate or effusion. Prominent thoracic kyphosis. Chronic rib deformities bilaterally. 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 IMPRESSION: No active cardiopulmonary disease. Electronically Signed   By: Marlan Palau M.D.   On: 03/10/2018 10:38   Ct Angio Chest Pe W  And/or Wo Contrast  Result Date: 03/10/2018 CLINICAL DATA:  Shortness of breath since this morning. History of CHF. EXAM: CT ANGIOGRAPHY CHEST WITH CONTRAST TECHNIQUE: Multidetector CT imaging of the chest was performed using the standard protocol during bolus administration of intravenous contrast. Multiplanar CT image reconstructions and MIPs were obtained to evaluate the vascular anatomy. CONTRAST:  65 cc ISOVUE-370 IOPAMIDOL (ISOVUE-370) INJECTION 76% COMPARISON:  Chest CT 10/10/2016 FINDINGS: Cardiovascular: Stable cardiac enlargement. No pericardial effusion. Prominent pericardial  and epicardial fat. The aorta is normal in caliber. No dissection. The branch vessels are patent. Scattered coronary artery calcifications, advanced for age. The pulmonary arterial tree is well opacified. No filling defects to suggest pulmonary embolism. Mediastinum/Nodes: No mediastinal or hilar mass or adenopathy. The esophagus is grossly normal. Lungs/Pleura: Patchy ground-glass opacity could suggest edema or inflammation. No focal airspace consolidation or pleural effusion. Upper Abdomen: Reflux of contrast is noted down the IVC and into the hepatic veins. This could be due to right heart failure or tricuspid regurgitation. Musculoskeletal: No significant findings. 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 Review of the MIP images confirms the above findings. IMPRESSION: 1. No CT findings for pulmonary embolism. 2. No aortic aneurysm or dissection. 3. Stable cardiac enlargement. 4. Reflux of contrast down the IVC and into the hepatic veins suggesting right heart failure or tricuspid regurgitation. 5. Patchy ground-glass opacity in the lungs is likely edema or inflammation. No pleural effusions or focal infiltrates. 6. Chronic spinal anomalies and associated scoliosis. Aortic Atherosclerosis (ICD10-I70.0). Electronically Signed   By: Rudie Meyer M.D.   On:  03/10/2018 16:53   Vas [place] Lower Extremity Venous (dvt)  Result Date: 03/11/2018  Lower Venous Study Indications: Swelling.  Performing Technologist: Gertie Fey MHA, RDMS, RVT, RDCS  Examination Guidelines: A complete evaluation includes B-mode imaging, spectral Doppler, color Doppler, and power Doppler as needed of all accessible portions of each vessel. Bilateral testing is considered an integral part of a complete examination. Limited examinations for reoccurring indications may be performed as noted.  Right Venous Findings: +---------+---------------+---------+-----------+----------+-------+          CompressibilityPhasicitySpontaneityPropertiesSummary +---------+---------------+---------+-----------+----------+-------+ CFV      Full           Yes      Yes                          +---------+---------------+---------+-----------+----------+-------+ SFJ      Full                                                 +---------+---------------+---------+-----------+----------+-------+ FV Prox  Full                                                 +---------+---------------+---------+-----------+----------+-------+ FV Mid   Full                                                 +---------+---------------+---------+-----------+----------+-------+ FV DistalFull                                                 +---------+---------------+---------+-----------+----------+-------+ PFV      Full                                                 +---------+---------------+---------+-----------+----------+-------+  POP      Full           Yes      Yes                          +---------+---------------+---------+-----------+----------+-------+ PTV      Full                                                 +---------+---------------+---------+-----------+----------+-------+ PERO     Full                                                  +---------+---------------+---------+-----------+----------+-------+  Left Venous Findings: +---------+---------------+---------+-----------+----------+-------+          CompressibilityPhasicitySpontaneityPropertiesSummary +---------+---------------+---------+-----------+----------+-------+ CFV      Full           Yes      Yes                          +---------+---------------+---------+-----------+----------+-------+ SFJ      Full                                                 +---------+---------------+---------+-----------+----------+-------+ FV Prox  Full                                                 +---------+---------------+---------+-----------+----------+-------+ FV Mid   Full                                                 +---------+---------------+---------+-----------+----------+-------+ FV DistalFull                                                 +---------+---------------+---------+-----------+----------+-------+ PFV      Full                                                 +---------+---------------+---------+-----------+----------+-------+ POP      Full           Yes      Yes                          +---------+---------------+---------+-----------+----------+-------+ PTV      Full                                                 +---------+---------------+---------+-----------+----------+-------+  PERO     Full                                                 +---------+---------------+---------+-----------+----------+-------+    Summary: Right: There is no evidence of deep vein thrombosis in the lower extremity. No cystic structure found in the popliteal fossa. Left: There is no evidence of deep vein thrombosis in the lower extremity. No cystic structure found in the popliteal fossa.  *See table(s) above for measurements and observations. Electronically signed by Coral Else MD on 03/11/2018 at 6:54:27 PM.    Final      Microbiology: Recent Results (from the past 240 hour(s))  Respiratory Panel by PCR     Status: None   Collection Time: 03/11/18 10:13 AM  Result Value Ref Range Status   Adenovirus NOT DETECTED NOT DETECTED Final   Coronavirus 229E NOT DETECTED NOT DETECTED Final   Coronavirus HKU1 NOT DETECTED NOT DETECTED Final   Coronavirus NL63 NOT DETECTED NOT DETECTED Final   Coronavirus OC43 NOT DETECTED NOT DETECTED Final   Metapneumovirus NOT DETECTED NOT DETECTED Final   Rhinovirus / Enterovirus NOT DETECTED NOT DETECTED Final   Influenza A NOT DETECTED NOT DETECTED Final   Influenza B NOT DETECTED NOT DETECTED Final   Parainfluenza Virus 1 NOT DETECTED NOT DETECTED Final   Parainfluenza Virus 2 NOT DETECTED NOT DETECTED Final   Parainfluenza Virus 3 NOT DETECTED NOT DETECTED Final   Parainfluenza Virus 4 NOT DETECTED NOT DETECTED Final   Respiratory Syncytial Virus NOT DETECTED NOT DETECTED Final   Bordetella pertussis NOT DETECTED NOT DETECTED Final   Chlamydophila pneumoniae NOT DETECTED NOT DETECTED Final   Mycoplasma pneumoniae NOT DETECTED NOT DETECTED Final     Labs: Basic Metabolic Panel: Recent Labs  Lab 03/10/18 1020 03/11/18 0441 03/12/18 0450  NA 142 141 138  K 3.4* 3.7 4.2  CL 103 98 97*  CO2 34* 35* 34*  GLUCOSE 98 118* 170*  BUN 16 15 17   CREATININE 0.87 0.93 0.82  CALCIUM 9.0 8.5* 9.2   Liver Function Tests: No results for input(s): AST, ALT, ALKPHOS, BILITOT, PROT, ALBUMIN in the last 168 hours. No results for input(s): LIPASE, AMYLASE in the last 168 hours. No results for input(s): AMMONIA in the last 168 hours. CBC: Recent Labs  Lab 03/10/18 1020  WBC 13.0*  HGB 13.7  HCT 45.6  MCV 97.2  PLT 340   Cardiac Enzymes: No results for input(s): CKTOTAL, CKMB, CKMBINDEX, TROPONINI in the last 168 hours. BNP: BNP (last 3 results) Recent Labs    03/10/18 1020  BNP 125.1*    ProBNP (last 3 results) Recent Labs    04/08/17 1350 11/12/17 1131   PROBNP 42.0 118.0*    CBG: No results for input(s): GLUCAP in the last 168 hours.     Signed:  Darlin Drop, MD [facility] 03/12/2018, 12:59 PM

## 2018-03-12 NOTE — Progress Notes (Signed)
RT offered to apply CPAP to pt, pt states she can apply CPAP herself when she is ready for it. Pt setting is auto titrate 16/4 with no O2 bled in. RT will continue monitoring.

## 2018-03-12 NOTE — Progress Notes (Signed)
Nebulizer for home ordered, Attending MD requested 02 sats to be repeated for home 02 prior to ordering home oxygen; B Cindi Ghazarian RN,MHA,BSN [phone] 

## 2018-03-12 NOTE — Progress Notes (Signed)
SATURATION QUALIFICATIONS: (This note is used to comply with regulatory documentation for home oxygen)  Patient Saturations on Room Air at Rest = 95%  Patient Saturations on Room Air while Ambulating = 87%  Patient Saturations on 2 Liters of oxygen while Ambulating = 95%  Please briefly explain why patient needs home oxygen: Pt sob with ambulation, able to recover easily.

## 2018-03-13 ENCOUNTER — Other Ambulatory Visit: Payer: Self-pay | Admitting: Pulmonary Disease

## 2018-03-13 LAB — PHOSPHORUS: PHOSPHORUS: 4.1 mg/dL (ref 2.5–4.6)

## 2018-03-13 LAB — BASIC METABOLIC PANEL
ANION GAP: 6 (ref 5–15)
BUN: 24 mg/dL — ABNORMAL HIGH (ref 6–20)
CALCIUM: 9.3 mg/dL (ref 8.9–10.3)
CO2: 37 mmol/L — ABNORMAL HIGH (ref 22–32)
CREATININE: 1.01 mg/dL — AB (ref 0.44–1.00)
Chloride: 94 mmol/L — ABNORMAL LOW (ref 98–111)
Glucose, Bld: 189 mg/dL — ABNORMAL HIGH (ref 70–99)
Potassium: 3.8 mmol/L (ref 3.5–5.1)
SODIUM: 137 mmol/L (ref 135–145)

## 2018-03-13 LAB — MAGNESIUM: Magnesium: 2.3 mg/dL (ref 1.7–2.4)

## 2018-03-13 MED ORDER — IRBESARTAN 75 MG PO TABS: 37.5000 mg | ORAL_TABLET | Freq: Every day | ORAL | 0 refills | Status: DC

## 2018-03-13 MED ORDER — FUROSEMIDE 10 MG/ML IJ SOLN
60.0000 mg | Freq: Two times a day (BID) | INTRAMUSCULAR | Status: DC
  Administered 2018-03-13: 60 mg via INTRAVENOUS
  Filled 2018-03-13: qty 6

## 2018-03-13 NOTE — Plan of Care (Signed)

## 2018-03-13 NOTE — Progress Notes (Signed)
Patient need home oxygen continuously; Meredith Diaz with [employer] called for delivery of portable oxygen tank to be delivered to the hosp and nebulizer machine; B [employer] [phone]

## 2018-03-13 NOTE — Progress Notes (Signed)
Patient has follow-up scheduled in the [facility] on Nov. 18 at 1030 AM.

## 2018-03-13 NOTE — Plan of Care (Signed)
  Problem: Activity: Goal: Capacity to carry out activities will improve Outcome: Adequate for Discharge   Problem: Education: Goal: Ability to verbalize understanding of medication therapies will improve Outcome: Adequate for Discharge   Problem: Education: Goal: Ability to demonstrate management of disease process will improve Outcome: Adequate for Discharge

## 2018-03-13 NOTE — Discharge Summary (Signed)
Discharge Summary  [name]:[id number] DOB: May 10, 1980  PCP: Myrlene Broker, MD  Admit date: 03/10/2018 Discharge date: 03/13/2018  Time spent: 35 minutes  Recommendations for Outpatient Follow-up:  1. Follow-up with your PCP 2. Follow-up with your cardiologist 3. Take your medications as prescribed  UPDATE: Patient had a Home O2 evaluation done today prior to her discharge and her oxygen level desaturated upon ambulation to the low 80's.   She requires an additional day of hospital care for further diuresis and further treatment with IV steroids to improve her acute on chronic hypoxic respiratory failure. Will reassess her O2 saturation with activity tomorrow 03/13/18.  03/13/2018: Patient seen and examined at bedside.  Reports her breathing is mildly improving.  No acute events overnight.  Repeat home O2 saturation with activity today 03/13/2018.  Currently being diuresis with IV Lasix and receiving IV steroids with improvement of wheezing.   Discharge Diagnoses:  Active Hospital Problems   Diagnosis Date Noted  . 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 Morbid obesity (HCC) 11/15/2015  . Elevated WBC count 06/17/2015  . OSA on CPAP 10/14/2014  . Essential hypertension 03/09/2014  . Asthma, chronic 01/28/2014  . Hypothyroidism 01/28/2014    Resolved Hospital Problems  No resolved problems to display.    Discharge Condition: Stable  Diet recommendation: Resume previous diet  Vitals:   03/13/18 0632 03/13/18 0827  BP: 134/85   Pulse: 88   Resp: 18   Temp: 98.4 F (36.9 C)   SpO2: 98% 95%    History of present illness:  Meredith Diaz a 37 y.o.femalewith a history ofasthma, OSA on nocturnal oxygen, not compliant with CPAP, morbid obesity, chronic combined CHF, depression and anxiety who presented on the advice of her PCP for increasing dyspnea on exertion. She's been experiencing general  dyspnea worse with exertion associated with chest tightness for several months, at time has been improved by taking extra doses of torsemide, but states this worsened over the past week. She's also noticed some R > L leg swelling. B/L LE duplex US negative for DVT. Negative procalcitonin. resp viral panel pending. 2D echo revealed improved LVEF.  03/11/18: seen and examined at her bedside. Still short of breath on ambulation. No chest pain.  Improved repeated 2D echo.  Asthma exacerbation suspected.   03/12/2018: Patient seen and examined at bedside.  No acute events overnight.  States her breathing continues to improve.  Cough is less.  Afebrile with negative procalcitonin.    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) Active Problems:   Hypothyroidism   Asthma, chronic   Essential hypertension   OSA on CPAP   Elevated WBC count   Morbid obesity (HCC)  Acute on chronic hypoxic respiratory failure suspect 2/2 to acute asthma exacerbation vs acute on chronic combined CHF On 2L O2 nocturnally at home Maintain O2 sat >92% C/w duonebs q6h and q2h prn Received 2 doses of IV steroids Continue prednisone 20 mg daily x5 days Follow-up with your PCP post hospitalization  Acute on chronic combined CHF Resume your home diuretic C/w I&O and daily weight 2D echo done on 03/11/2018 revealed improved LVEF with EF 50-55% from 40 to 45% C/w cardiac meds  Suspected asthma exacerbation C/w home meds Completed 2 doses of IV solumedrol 60 mg BID yesterday 03/11/2018 Stop IV Solu-Medrol and switch to oral prednisone 20 mg daily x5 days Follow up  with your PCP   OSA Continue CPAP qhs  Depression/anxiety C/w home meds  Elevated D-dimer  Negative PE workup  Hypothyroidism C/w levothyroxine    Code Status: full   Consultants:  none  Procedures:  none   Antimicrobials:  none  Discharge  Exam: BP 134/85 (BP Location: Right Arm)   Pulse 88   Temp 98.4 F (36.9 C) (Oral)   Resp 18   Ht 4\' 11"  (1.499 m)   Wt 98.7 kg Comment: scale a  SpO2 95%   BMI 43.97 kg/m  . General: 37 y.o. year-old female developed well-nourished in no acute distress.  Alert oriented x3.   . Cardiovascular: Regular rate and rhythm with no rubs or gallops.  No JVD or thyromegaly noted.   [name] Respiratory: Mild rales noted anteriorly bilaterally.  Improvement of wheezes noted at this time.  . Abdomen: Soft nontender nondistended with normal bowel sounds x4 quadrants. . Musculoskeletal: No lower extremity edema. 2/4 pulses in all 4 extremities. . Skin: No ulcerative lesions noted or rashes, . Psychiatry: Mood is appropriate for condition and setting  Discharge Instructions You were cared for by a hospitalist during your hospital stay. If you have any questions about your discharge medications or the care you received while you were in the hospital after you are discharged, you can call the unit and asked to speak with the hospitalist on call if the hospitalist that took care of you is not available. Once you are discharged, your primary care physician will handle any further medical issues. Please note that NO REFILLS for any discharge medications will be authorized once you are discharged, as it is imperative that you return to your primary care physician (or establish a relationship with a primary care physician if you do not have one) for your aftercare needs so that they can reassess your need for medications and monitor your lab values.  Discharge Instructions    DME Nebulizer machine   Complete by:  As directed    Patient needs a nebulizer to treat with the following condition:  Asthma exacerbation     Allergies as of 03/13/2018      Reactions   Lisinopril Swelling, Other (See Comments)   Angioedema facial, mostly just lips, no trouble breathing      Medication List    STOP taking these  medications   ibuprofen 200 MG tablet Commonly known as:  ADVIL,MOTRIN   traZODone 50 MG tablet Commonly known as:  DESYREL   valsartan 40 MG tablet Commonly known as:  DIOVAN Replaced by:  irbesartan 75 MG tablet     TAKE these medications   ALPRAZolam 1 MG tablet Commonly known as:  XANAX Take 1 mg by mouth daily as needed for anxiety.   budesonide-formoterol 160-4.5 MCG/ACT inhaler Commonly known as:  SYMBICORT TAKE 2 PUFFS BY MOUTH TWICE A DAY What changed:    how much to take  how to take this  when to take this  additional instructions   clobetasol cream 0.05 % Commonly known as:  TEMOVATE Apply 1 application topically See admin instructions. Apply daily to affected areas of genital area as directed   dextromethorphan-guaiFENesin 30-600 MG 12hr tablet Commonly known as:  MUCINEX DM Take 2 tablets by mouth 2 (two) times daily.   DULoxetine 60 MG capsule Commonly known as:  CYMBALTA TAKE 1 CAPSULE (60 MG TOTAL) BY MOUTH DAILY. What changed:  See the new instructions.   ipratropium-albuterol 0.5-2.5 (3) MG/3ML Soln Commonly known as:  DUONEB Take 3 mLs by nebulization 2 (two) times daily as needed (For wheezing or shoortness of breath).   irbesartan 75 MG tablet Commonly known as:  AVAPRO Take 0.5 tablets (37.5 mg total) by mouth daily. Start taking on:  03/14/2018 Replaces:  valsartan 40 MG tablet   levothyroxine 125 MCG tablet Commonly known as:  SYNTHROID, LEVOTHROID Take 1 tablet (125 mcg total) by mouth daily. What changed:  when to take this   metFORMIN 500 MG tablet Commonly known as:  GLUCOPHAGE Take 1 tablet (500 mg total) by mouth daily with breakfast.   metoprolol succinate 25 MG 24 hr tablet Commonly known as:  TOPROL-XL Take 1 tablet (25 mg total) by mouth at bedtime. What changed:    medication strength  See the new instructions.   predniSONE 20 MG tablet Commonly known as:  DELTASONE Take 1 tablet (20 mg total) by mouth daily  with breakfast for 5 days.   spironolactone 25 MG tablet Commonly known as:  ALDACTONE TAKE 1 TABLET EVERY DAY   torsemide 20 MG tablet Commonly known as:  DEMADEX Take 2 tablets (40 mg total) by mouth daily.   VENTOLIN HFA 108 (90 Base) MCG/ACT inhaler Generic drug:  albuterol INHALE 1-2 PUFFS INTO THE LUNGS EVERY 6 (SIX) HOURS AS NEEDED FOR WHEEZING OR SHORTNESS OF BREATH. What changed:  See the new instructions.   Vitamin D (Ergocalciferol) 1.25 MG (50000 UT) Caps capsule Commonly known as:  DRISDOL Take 1 capsule (50,000 Units total) by mouth every 7 (seven) days.            Durable Medical Equipment  (From admission, onward)         Start     Ordered   03/12/18 1050  For home use only DME oxygen  Once    Question Answer Comment  Mode or (Route) Nasal cannula   Liters per Minute 2   Frequency Continuous (stationary and portable oxygen unit needed)   Oxygen conserving device No   Oxygen delivery system Gas      03/12/18 1049   03/12/18 0000  DME Nebulizer machine    Question:  Patient needs a nebulizer to treat with the following condition  Answer:  Asthma exacerbation   03/12/18 1033         Allergies  Allergen Reactions  . Lisinopril Swelling and Other (See Comments)    Angioedema facial, mostly just lips, no trouble breathing   Follow-up Information    Myrlene Broker, MD. Go on 03/17/2018.   Specialty:  Internal Medicine Why:  Call for a post hospital follow-up appointment @9 :00am Contact information: [address] [place] [address] [phone]        [facility]. Go on 03/24/2018.   Specialty:  Cardiology Why:  at 10:30 AM in the [facility].  Please bring all medications to appt.  Gate code is 1800 for month of November. Contact information: [address] [id number] [place] [address] [phone]           The results of  significant diagnostics from this hospitalization (including imaging, microbiology, ancillary and laboratory) are listed below for reference.    Significant Diagnostic Studies: Dg Chest 2 View  Result Date: 03/10/2018 CLINICAL DATA:  Short of breath EXAM: CHEST - 2 VIEW COMPARISON:  11/12/2017 FINDINGS: Heart size upper normal. Widening of the superior mediastinum most likely mediastinal fat based on prior chest CT of 10/10/2016. Negative  for heart failure. Negative for infiltrate or effusion. Prominent thoracic kyphosis. Chronic rib deformities bilaterally. 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 IMPRESSION: No active cardiopulmonary disease. Electronically Signed   By: Marlan Palau M.D.   On: 03/10/2018 10:38   Ct Angio Chest Pe W And/or Wo Contrast  Result Date: 03/10/2018 CLINICAL DATA:  Shortness of breath since this morning. History of CHF. EXAM: CT ANGIOGRAPHY CHEST WITH CONTRAST TECHNIQUE: Multidetector CT imaging of the chest was performed using the standard protocol during bolus administration of intravenous contrast. Multiplanar CT image reconstructions and MIPs were obtained to evaluate the vascular anatomy. CONTRAST:  65 cc ISOVUE-370 IOPAMIDOL (ISOVUE-370) INJECTION 76% COMPARISON:  Chest CT 10/10/2016 FINDINGS: Cardiovascular: Stable cardiac enlargement. No pericardial effusion. Prominent pericardial and epicardial fat. The aorta is normal in caliber. No dissection. The branch vessels are patent. Scattered coronary artery calcifications, advanced for age. The pulmonary arterial tree is well opacified. No filling defects to suggest pulmonary embolism. Mediastinum/Nodes: No mediastinal or hilar mass or adenopathy. The esophagus is grossly normal. Lungs/Pleura: Patchy ground-glass opacity could suggest edema or inflammation. No focal airspace consolidation or pleural effusion. Upper Abdomen: Reflux of contrast is noted down the IVC and into the hepatic veins. This could be due to  right heart failure or tricuspid regurgitation. Musculoskeletal: No significant findings. 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 Review of the MIP images confirms the above findings. IMPRESSION: 1. No CT findings for pulmonary embolism. 2. No aortic aneurysm or dissection. 3. Stable cardiac enlargement. 4. Reflux of contrast down the IVC and into the hepatic veins suggesting right heart failure or tricuspid regurgitation. 5. Patchy ground-glass opacity in the lungs is likely edema or inflammation. No pleural effusions or focal infiltrates. 6. Chronic spinal anomalies and associated scoliosis. Aortic Atherosclerosis (ICD10-I70.0). Electronically Signed   By: Rudie Meyer M.D.   On: 03/10/2018 16:53   Vas [place] Lower Extremity Venous (dvt)  Result Date: 03/11/2018  Lower Venous Study Indications: Swelling.  Performing Technologist: Gertie Fey MHA, RDMS, RVT, RDCS  Examination Guidelines: A complete evaluation includes B-mode imaging, spectral Doppler, color Doppler, and power Doppler as needed of all accessible portions of each vessel. Bilateral testing is considered an integral part of a complete examination. Limited examinations for reoccurring indications may be performed as noted.  Right Venous Findings: +---------+---------------+---------+-----------+----------+-------+          CompressibilityPhasicitySpontaneityPropertiesSummary +---------+---------------+---------+-----------+----------+-------+ CFV      Full           Yes      Yes                          +---------+---------------+---------+-----------+----------+-------+ SFJ      Full                                                 +---------+---------------+---------+-----------+----------+-------+ FV Prox  Full                                                 +---------+---------------+---------+-----------+----------+-------+ FV Mid   Full                                                  +---------+---------------+---------+-----------+----------+-------+  FV DistalFull                                                 +---------+---------------+---------+-----------+----------+-------+ PFV      Full                                                 +---------+---------------+---------+-----------+----------+-------+ POP      Full           Yes      Yes                          +---------+---------------+---------+-----------+----------+-------+ PTV      Full                                                 +---------+---------------+---------+-----------+----------+-------+ PERO     Full                                                 +---------+---------------+---------+-----------+----------+-------+  Left Venous Findings: +---------+---------------+---------+-----------+----------+-------+          CompressibilityPhasicitySpontaneityPropertiesSummary +---------+---------------+---------+-----------+----------+-------+ CFV      Full           Yes      Yes                          +---------+---------------+---------+-----------+----------+-------+ SFJ      Full                                                 +---------+---------------+---------+-----------+----------+-------+ FV Prox  Full                                                 +---------+---------------+---------+-----------+----------+-------+ FV Mid   Full                                                 +---------+---------------+---------+-----------+----------+-------+ FV DistalFull                                                 +---------+---------------+---------+-----------+----------+-------+ PFV      Full                                                 +---------+---------------+---------+-----------+----------+-------+  POP      Full           Yes      Yes                           +---------+---------------+---------+-----------+----------+-------+ PTV      Full                                                 +---------+---------------+---------+-----------+----------+-------+ PERO     Full                                                 +---------+---------------+---------+-----------+----------+-------+    Summary: Right: There is no evidence of deep vein thrombosis in the lower extremity. No cystic structure found in the popliteal fossa. Left: There is no evidence of deep vein thrombosis in the lower extremity. No cystic structure found in the popliteal fossa.  *See table(s) above for measurements and observations. Electronically signed by Coral Else MD on 03/11/2018 at 6:54:27 PM.    Final     Microbiology: Recent Results (from the past 240 hour(s))  Respiratory Panel by PCR     Status: None   Collection Time: 03/11/18 10:13 AM  Result Value Ref Range Status   Adenovirus NOT DETECTED NOT DETECTED Final   Coronavirus 229E NOT DETECTED NOT DETECTED Final   Coronavirus HKU1 NOT DETECTED NOT DETECTED Final   Coronavirus NL63 NOT DETECTED NOT DETECTED Final   Coronavirus OC43 NOT DETECTED NOT DETECTED Final   Metapneumovirus NOT DETECTED NOT DETECTED Final   Rhinovirus / Enterovirus NOT DETECTED NOT DETECTED Final   Influenza A NOT DETECTED NOT DETECTED Final   Influenza B NOT DETECTED NOT DETECTED Final   Parainfluenza Virus 1 NOT DETECTED NOT DETECTED Final   Parainfluenza Virus 2 NOT DETECTED NOT DETECTED Final   Parainfluenza Virus 3 NOT DETECTED NOT DETECTED Final   Parainfluenza Virus 4 NOT DETECTED NOT DETECTED Final   Respiratory Syncytial Virus NOT DETECTED NOT DETECTED Final   Bordetella pertussis NOT DETECTED NOT DETECTED Final   Chlamydophila pneumoniae NOT DETECTED NOT DETECTED Final   Mycoplasma pneumoniae NOT DETECTED NOT DETECTED Final     Labs: Basic Metabolic Panel: Recent Labs  Lab 03/10/18 1020 03/11/18 0441 03/12/18 0450  03/13/18 0434  NA 142 141 138 137  K 3.4* 3.7 4.2 3.8  CL 103 98 97* 94*  CO2 34* 35* 34* 37*  GLUCOSE 98 118* 170* 189*  BUN 16 15 17  24*  CREATININE 0.87 0.93 0.82 1.01*  CALCIUM 9.0 8.5* 9.2 9.3  MG  --   --   --  2.3  PHOS  --   --   --  4.1   Liver Function Tests: No results for input(s): AST, ALT, ALKPHOS, BILITOT, PROT, ALBUMIN in the last 168 hours. No results for input(s): LIPASE, AMYLASE in the last 168 hours. No results for input(s): AMMONIA in the last 168 hours. CBC: Recent Labs  Lab 03/10/18 1020  WBC 13.0*  HGB 13.7  HCT 45.6  MCV 97.2  PLT 340   Cardiac Enzymes: No results for input(s): CKTOTAL, CKMB, CKMBINDEX, TROPONINI in the last 168 hours. BNP: BNP (last  3 results) Recent Labs    03/10/18 1020  BNP 125.1*    ProBNP (last 3 results) Recent Labs    04/08/17 1350 11/12/17 1131  PROBNP 42.0 118.0*    CBG: No results for input(s): GLUCAP in the last 168 hours.     Signed:  Darlin Drop, MD [facility] 03/13/2018, 10:24 AM

## 2018-03-17 ENCOUNTER — Ambulatory Visit (INDEPENDENT_AMBULATORY_CARE_PROVIDER_SITE_OTHER): Payer: Commercial Managed Care - PPO | Admitting: Internal Medicine

## 2018-03-17 ENCOUNTER — Encounter: Payer: Self-pay | Admitting: Internal Medicine

## 2018-03-17 VITALS — BP 126/84 | HR 102 | Temp 98.4°F | Ht 59.0 in | Wt 213.0 lb

## 2018-03-17 DIAGNOSIS — R42 Dizziness and giddiness: Secondary | ICD-10-CM | POA: Diagnosis not present

## 2018-03-17 DIAGNOSIS — R0602 Shortness of breath: Secondary | ICD-10-CM | POA: Diagnosis not present

## 2018-03-17 DIAGNOSIS — I5041 Acute combined systolic (congestive) and diastolic (congestive) heart failure: Secondary | ICD-10-CM | POA: Diagnosis not present

## 2018-03-17 NOTE — Assessment & Plan Note (Signed)
She needs to do cardiopulmonary rehab to get her safely exercising again so that she can continue her work with exercise.

## 2018-03-17 NOTE — Patient Instructions (Addendum)
We will get you back into the rehab to build up endurance. We will keep the oxygen for now and hopefully the exercise will help you get rid of that. Keep using oxygen when active.   We will see you back in 1 month to recheck. Call us if weight or swelling is increasing more than 3 pounds.   Stop the prednisone and do not take more.   Use the nebulizer only if needed for breathing.

## 2018-03-17 NOTE — Assessment & Plan Note (Signed)
Is improving some and will ask her to use nebulizer prn only instead of scheduled as this can contribute. She will also stop mucinex as lungs clear on exam.

## 2018-03-17 NOTE — Progress Notes (Signed)
   Subjective:    Patient ID: Meredith Diaz, female    DOB: June 14, 1980, 37 y.o.   MRN: [id number]  HPI The patient is a 37 YO female coming in for hospital follow up (in for acute chronic systolic heart failure and possible asthma flare, treated with IV diuresis and steroids and taking nebulizers BID currently). Taking prednisone still but has not taken yet today. She was discharged home with oxygen for activity. She is not thriving since being home. Activity is still poor. She had been working on losing weight prior to hospital stay and was down about 7 pounds. She has been working on low sodium and did not feel like she was eating wrong things prior to hospital. Lost about 10-15 pounds with diuresis in the hospital. Her breathing is better some but not great. She is taking torsemide 2 pills daily and spironolactone 1 pill daily. They did switch her to irbesartan in hospital (no mention possible just formulary change). She denies stomach problems including diarrhea, constipation, abdominal pains. She does not have chest pains. She has done cardiopulmonary rehab and got benefit and may need this again. Some slight dizziness which is improving gradually.  PMH, [facility], social history reviewed and updated.   Review of Systems  Constitutional: Positive for activity change and fatigue.  HENT: Negative.   Eyes: Negative.   Respiratory: Positive for shortness of breath. Negative for cough and chest tightness.   Cardiovascular: Negative for chest pain, palpitations and leg swelling.  Gastrointestinal: Negative for abdominal distention, abdominal pain, constipation, diarrhea, nausea and vomiting.  Musculoskeletal: Negative.   Skin: Negative.   Neurological: Positive for light-headedness.  Psychiatric/Behavioral: Negative.       Objective:   Physical Exam  Constitutional: She is oriented to person, place, and time. She appears well-developed and well-nourished.  HENT:  Head: Normocephalic and  atraumatic.  Eyes: EOM are normal.  Neck: Normal range of motion.  Cardiovascular: Normal rate and regular rhythm.  Pulmonary/Chest: Effort normal and breath sounds normal. No respiratory distress. She has no wheezes. She has no rales.  Oxygen on during visit  Abdominal: Soft. Bowel sounds are normal. She exhibits no distension. There is no tenderness. There is no rebound.  Musculoskeletal: She exhibits no edema.  Neurological: She is alert and oriented to person, place, and time. Coordination normal.  Skin: Skin is warm and dry.  Psychiatric: She has a normal mood and affect.   Vitals:   03/17/18 0844  BP: 126/84  Pulse: (!) 102  Temp: 98.4 F (36.9 C)  TempSrc: Oral  SpO2: 98%  Weight: 213 lb (96.6 kg)  Height: 4\' 11"  (1.499 m)      Assessment & Plan:

## 2018-03-17 NOTE — Assessment & Plan Note (Signed)
Suspect some multifactorial but will have her stop prednisone as she is getting side effects from this currently. Majority of symptoms I believe were from heart failure exacerbation and seeing cardiology later this week. Referral to cardiopulmonary rehab to build endurance. Walk test today with desaturation to 85% with walking and hoping that rehab will help [place] stop the oxygen with activity.

## 2018-03-17 NOTE — Assessment & Plan Note (Signed)
Weight is down about 10-15 pounds since last visit and likely was the main cause of symptoms. Will continue with current dosing of torsemide and spironolactone.

## 2018-03-20 ENCOUNTER — Telehealth (HOSPITAL_COMMUNITY): Payer: Self-pay

## 2018-03-20 NOTE — Telephone Encounter (Signed)
Called and requested benefits fax from [facility] - once fax received will call with passcode to get eligibility and benefits.

## 2018-03-21 ENCOUNTER — Telehealth (HOSPITAL_COMMUNITY): Payer: Self-pay

## 2018-03-21 DIAGNOSIS — G4733 Obstructive sleep apnea (adult) (pediatric): Secondary | ICD-10-CM | POA: Diagnosis not present

## 2018-03-21 NOTE — Telephone Encounter (Signed)
Received insurance fax.  Attempted to contact patient in regards to PR - vm is full. Sending letter.

## 2018-03-24 ENCOUNTER — Encounter (HOSPITAL_COMMUNITY): Payer: Self-pay

## 2018-03-24 ENCOUNTER — Other Ambulatory Visit: Payer: Self-pay

## 2018-03-24 ENCOUNTER — Ambulatory Visit (HOSPITAL_COMMUNITY)
Admission: RE | Admit: 2018-03-24 | Discharge: 2018-03-24 | Disposition: A | Discharge status: Home / Self Care | Payer: Commercial Managed Care - PPO | Source: Ambulatory Visit | Attending: Cardiology | Admitting: Cardiology

## 2018-03-24 ENCOUNTER — Other Ambulatory Visit: Payer: Self-pay | Admitting: Pulmonary Disease

## 2018-03-24 VITALS — BP 128/88 | HR 96 | Wt 215.4 lb

## 2018-03-24 DIAGNOSIS — D72829 Elevated white blood cell count, unspecified: Secondary | ICD-10-CM | POA: Insufficient documentation

## 2018-03-24 DIAGNOSIS — H8110 Benign paroxysmal vertigo, unspecified ear: Secondary | ICD-10-CM | POA: Diagnosis not present

## 2018-03-24 DIAGNOSIS — E039 Hypothyroidism, unspecified: Secondary | ICD-10-CM | POA: Diagnosis not present

## 2018-03-24 DIAGNOSIS — X58XXXA Exposure to other specified factors, initial encounter: Secondary | ICD-10-CM | POA: Insufficient documentation

## 2018-03-24 DIAGNOSIS — R5382 Chronic fatigue, unspecified: Secondary | ICD-10-CM

## 2018-03-24 DIAGNOSIS — G4733 Obstructive sleep apnea (adult) (pediatric): Secondary | ICD-10-CM | POA: Diagnosis not present

## 2018-03-24 DIAGNOSIS — J45909 Unspecified asthma, uncomplicated: Secondary | ICD-10-CM | POA: Insufficient documentation

## 2018-03-24 DIAGNOSIS — E669 Obesity, unspecified: Secondary | ICD-10-CM | POA: Insufficient documentation

## 2018-03-24 DIAGNOSIS — F329 Major depressive disorder, single episode, unspecified: Secondary | ICD-10-CM | POA: Diagnosis not present

## 2018-03-24 DIAGNOSIS — I428 Other cardiomyopathies: Secondary | ICD-10-CM | POA: Diagnosis not present

## 2018-03-24 DIAGNOSIS — Z79899 Other long term (current) drug therapy: Secondary | ICD-10-CM | POA: Insufficient documentation

## 2018-03-24 DIAGNOSIS — Z7951 Long term (current) use of inhaled steroids: Secondary | ICD-10-CM | POA: Diagnosis not present

## 2018-03-24 DIAGNOSIS — I5042 Chronic combined systolic (congestive) and diastolic (congestive) heart failure: Secondary | ICD-10-CM | POA: Diagnosis not present

## 2018-03-24 DIAGNOSIS — Z8249 Family history of ischemic heart disease and other diseases of the circulatory system: Secondary | ICD-10-CM | POA: Diagnosis not present

## 2018-03-24 DIAGNOSIS — Z7984 Long term (current) use of oral hypoglycemic drugs: Secondary | ICD-10-CM | POA: Insufficient documentation

## 2018-03-24 DIAGNOSIS — T783XXA Angioneurotic edema, initial encounter: Secondary | ICD-10-CM | POA: Insufficient documentation

## 2018-03-24 DIAGNOSIS — Z79890 Hormone replacement therapy (postmenopausal): Secondary | ICD-10-CM | POA: Insufficient documentation

## 2018-03-24 DIAGNOSIS — I5022 Chronic systolic (congestive) heart failure: Secondary | ICD-10-CM | POA: Diagnosis not present

## 2018-03-24 LAB — BASIC METABOLIC PANEL
Anion gap: 11 (ref 5–15)
BUN: 14 mg/dL (ref 6–20)
CHLORIDE: 97 mmol/L — AB (ref 98–111)
CO2: 29 mmol/L (ref 22–32)
Calcium: 9.3 mg/dL (ref 8.9–10.3)
Creatinine, Ser: 0.96 mg/dL (ref 0.44–1.00)
GFR calc Af Amer: >60 mL/min (ref >60)
GFR calc non Af Amer: >60 mL/min (ref >60)
GLUCOSE: 130 mg/dL — AB (ref 70–99)
POTASSIUM: 2.9 mmol/L — AB (ref 3.5–5.1)
Sodium: 137 mmol/L (ref 135–145)

## 2018-03-24 LAB — BRAIN NATRIURETIC PEPTIDE: B Natriuretic Peptide: 50.3 pg/mL (ref 0.0–100.0)

## 2018-03-24 MED ORDER — METOPROLOL SUCCINATE ER 25 MG PO TB24: 25.0000 mg | ORAL_TABLET | Freq: Every day | ORAL | 6 refills | Status: DC

## 2018-03-24 MED ORDER — VALSARTAN 80 MG PO TABS: 80.0000 mg | ORAL_TABLET | Freq: Every day | ORAL | 6 refills | Status: DC

## 2018-03-24 MED ORDER — SPIRONOLACTONE 25 MG PO TABS: 25.0000 mg | ORAL_TABLET | Freq: Every day | ORAL | 6 refills | Status: DC

## 2018-03-24 NOTE — Progress Notes (Signed)
Patient ID: Meredith Diaz, female   DOB: 1980-09-23, 37 y.o.   MRN: [id number]     Advanced Heart Failure Clinic Note   PCP: Dr. Okey Dupre HF Cardiology: Dr. Hart Robinsons Meredith Diaz is a 37 y.o. female with a hx of nonischemic cardiomyopathy.  This was diagnosed in 2013.  EF was initially 25-30%.  Cardiac cath at that time showed no significant CAD.  Cardiac MRI showed no late gadoliniuim enhancement.  Since that time, EF has remained low but has improved a bit.  Most recent echo in 11/17 showed EF 45-50% with diffuse hypokinesis.  She also has asthma.  PFTs showed a severe restrictive defect that may be due to body habitus primarily.   She was admitted in 8/16 with chest pain.  CTA chest showed no PE, lung parenchyma looked normal.  She had ETT with poor exercise tolerance but no evidence for ischemia.  CPX was done in 12/16, showing moderate functional limitation due primarily to lung restriction.   Admitted 11/4 - 03/13/18 for increased DOE. Diuresed well with IV lasix and treated for asthma exacerbation. Treated with solumedrol and prednisone burst.   She presents today for post hospital follow up. Struggling to feel better after recent admit. Not very active, and remains fatigued. She is not exercising currently, but has been referred back to Cardiac rehab and plans to start. She is not wearing CPAP, and hasn't in several months. She remains dyspneic after walking ~ 100 feet or walking up a flight of stairs. She generally has poor energy. Denies CP. No lightheadedness or dizziness. Working with her PCP and nutritionist. She did not find the healthy weight management clinic helpful.   Labs (9/16): K 3.2, creatinine 1.11, hgb 14.3 Labs (12/16): K 3.3, creatinine 0.96, BNP 23 Labs (2/17): K 4.7, creatinine 0.95, TSH normal Labs (8/17): K 3.9, creatinine 0.93 Labs (12/18): K 3.6, creatinine 0.82, pro-BNP 42, TSH normal  ECG (personally reviewed): NSR, nonspecific anterior TWF  PMH: 1. Chronic  systolic CHF: Nonischemic cardiomyopathy.  - EF 25-30% by echo in 2013.  - Cardiac MRI (2013) with EF 52%, no delayed enhancement.  - LHC in 2013 without significant CAD. - 11/15 echo with EF 40-45%.   - 7/16 echo with EF 40-45%, grade II diastolic dysfunction.  - ETT (8/33) with poor exercise tolerance but no ischemia.  - CPX (12/16): peak VO2 15.7 (23.5 adjusted for ideal body weight), VE/VCO2 slope 21, RER 1.15 => moderate functional limitation thought to be primarily due to restrictive lung physiology.  - Echo (11/17): EF 45-50%, mild diffuse hypokinesis, normal RV size and systolic function.  2. Asthma: PFTs (4/16) with mild obstruction, severe restriction and severely decreased DLCO => think primarily due to body habitus.  3. Depression 4. Hypothyroidism 5. 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  6. Angioedema with ACEI 7. OSA: CPAP.  8. Chronic leukocytosis: Negative workup.  9. BPPV 10. Obesity  SH: Lives in [place], no smoking, rare ETOH, works as Programmer, multimedia.   FH: Father with CAD, grandfather and grandmother with MIs.  No cardiomyopathy.   Review of systems complete and found to be negative unless listed in HPI.    Current Outpatient Medications  Medication Sig Dispense Refill  . irbesartan (AVAPRO) 75 MG tablet Take 0.5 tablets (37.5 mg total) by mouth daily. 30 tablet 0  . levothyroxine (SYNTHROID, LEVOTHROID) 125 MCG tablet Take 1 tablet (125 mcg total) by mouth daily. (Patient taking differently: Take 125 mcg by mouth at bedtime. )  90 tablet 2  . metoprolol succinate (TOPROL-XL) 25 MG 24 hr tablet Take 1 tablet (25 mg total) by mouth at bedtime. 30 tablet 0  . spironolactone (ALDACTONE) 25 MG tablet TAKE 1 TABLET EVERY DAY (Patient taking differently: Take 25 mg by mouth daily. ) 30 tablet 6  . torsemide (DEMADEX) 20 MG tablet Take 2 tablets (40 mg total) by mouth daily. 60 tablet 11  . ALPRAZolam (XANAX) 1 MG tablet Take 1 mg by mouth daily as needed for  anxiety.    . budesonide-formoterol (SYMBICORT) 160-4.5 MCG/ACT inhaler TAKE 2 PUFFS BY MOUTH TWICE A DAY (Patient taking differently: Inhale 2 puffs into the lungs 2 (two) times daily. ) 10.2 Inhaler 0  . clobetasol cream (TEMOVATE) 0.05 % Apply 1 application topically See admin instructions. Apply daily to affected areas of genital area as directed    . dextromethorphan-guaiFENesin (MUCINEX DM) 30-600 MG 12hr tablet Take 2 tablets by mouth 2 (two) times daily. 14 tablet 0  . DULoxetine (CYMBALTA) 60 MG capsule TAKE 1 CAPSULE (60 MG TOTAL) BY MOUTH DAILY. (Patient taking differently: Take 60 mg by mouth daily. ) 90 capsule 2  . ipratropium-albuterol (DUONEB) 0.5-2.5 (3) MG/3ML SOLN Take 3 mLs by nebulization 2 (two) times daily as needed (For wheezing or shoortness of breath). 180 mL 0  . metFORMIN (GLUCOPHAGE) 500 MG tablet Take 1 tablet (500 mg total) by mouth daily with breakfast. (Patient not taking: Reported on 03/10/2018) 30 tablet 0  . VENTOLIN HFA 108 (90 Base) MCG/ACT inhaler INHALE 1-2 PUFFS INTO THE LUNGS EVERY 6 (SIX) HOURS AS NEEDED FOR WHEEZING OR SHORTNESS OF BREATH. (Patient taking differently: Inhale 1-2 puffs into the lungs every 6 (six) hours as needed for wheezing or shortness of breath. ) 18 Inhaler 0   No current facility-administered medications for this encounter.    Vitals:   03/24/18 1025  BP: 128/88  Pulse: 96  SpO2: 97%  Weight: 97.7 kg (215 lb 6.4 oz)     Wt Readings from Last 3 Encounters:  03/24/18 97.7 kg (215 lb 6.4 oz)  03/17/18 96.6 kg (213 lb)  03/13/18 98.7 kg (217 lb 11.2 oz)   General: No resp difficulty. HEENT: Normal Neck: Supple. JVP 5-6. Carotids 2+ bilat; no bruits. No thyromegaly or nodule noted. Cor: PMI nondisplaced. RRR, No M/G/R noted Lungs: CTAB, normal effort. Abdomen: Obese, soft, non-tender, non-distended, no HSM. No bruits or masses. +BS  Extremities: No cyanosis, clubbing, or rash. R and LLE no edema.  Neuro: Alert & orientedx3,  cranial nerves grossly intact. moves all 4 extremities w/o difficulty. Affect pleasant   Assessment/Plan: 1. Chronic systolic CHF: Nonischemic cardiomyopathy.  No CAD on prior cath, prior cMRI showed no late gadolinium enhancement.  No family history of cardiomyopathy.  No ETOH/substance abuse.  No uncontrolled HTN.  Possible that she had viral myocarditis. CPX showed moderate functional limitation but this seems to be primarily due to lung restriction (probably from body habitus).  Last echo in 2017 with EF 45-50%, diffuse hypokinesis, normal RV. NYHA III symptoms chronically and volume status OK on exam.    - Echo 03/11/18 with LVEF 50-55%, Trivial AI, RV mildly reduced, Trivial TR, Normal size IVC.  - Continue Toprol XL 25 mg daily.  - Continue spironolactone 25 daily.   - Stop irbesartan. Switch back to valsartan 80 mg BID.  She has tolerated ARB but had angioedema with ACEI, so cannot use ACEI or ARNI.   2. Asthma:  - No wheeze on  exam. Stable after recent admit.  3. Restrictive PFTs: Suspect this is primarily due to body habitus.   - No change.  4. Obesity:  - Body mass index is 43.51 kg/m.  - Did not feel like [facility] weight management program was helpful. - Considering returning to cardiac rehab. Strongly encouraged to resume. Also working with a nutritionist.  5. Fatigue: Out of proportion to her HF.  - Suspect obesity/deconditioning plays a large role here, and with her DOE.   Labs today. RTC 2-3 months, sooner with symptoms.   Graciella Freer, PA-C  03/24/2018  Greater than 50% of the 25 minute visit was spent in counseling/coordination of care regarding disease state education, salt/fluid restriction, sliding scale diuretics, and medication compliance.

## 2018-03-24 NOTE — Patient Instructions (Addendum)
STOP Avapro START Diovan 80 mg, one tab twice a day  Labs today We will only contact you if something comes back abnormal or we need to make some changes. Otherwise no news is good news!  Your physician recommends that you schedule a follow-up appointment in: 2-3 months with Dr Shirlee Latch   Do the following things EVERYDAY: 1) Weigh yourself in the morning before breakfast. Write it down and keep it in a log. 2) Take your medicines as prescribed 3) Eat low salt foods-Limit salt (sodium) to 2000 mg per day.  4) Stay as active as you can everyday 5) Limit all fluids for the day to less than 2 liters

## 2018-03-25 ENCOUNTER — Telehealth (HOSPITAL_COMMUNITY): Payer: Self-pay

## 2018-03-25 NOTE — Telephone Encounter (Signed)
Attempted to contact pt in regards to PR. Unable to leave VM, Vm full.

## 2018-03-27 ENCOUNTER — Encounter (HOSPITAL_COMMUNITY): Payer: Self-pay | Admitting: Cardiology

## 2018-03-27 NOTE — Progress Notes (Signed)
Abnormal lab mailed

## 2018-03-31 ENCOUNTER — Encounter: Payer: Self-pay | Admitting: Pulmonary Disease

## 2018-03-31 ENCOUNTER — Encounter: Payer: Self-pay | Admitting: Internal Medicine

## 2018-03-31 ENCOUNTER — Ambulatory Visit (INDEPENDENT_AMBULATORY_CARE_PROVIDER_SITE_OTHER): Payer: Commercial Managed Care - PPO | Admitting: Pulmonary Disease

## 2018-03-31 VITALS — BP 124/80 | HR 82 | Temp 98.3°F | Ht 59.0 in | Wt 220.2 lb

## 2018-03-31 DIAGNOSIS — G4734 Idiopathic sleep related nonobstructive alveolar hypoventilation: Secondary | ICD-10-CM

## 2018-03-31 DIAGNOSIS — Q249 Congenital malformation of heart, unspecified: Secondary | ICD-10-CM

## 2018-03-31 DIAGNOSIS — G4733 Obstructive sleep apnea (adult) (pediatric): Secondary | ICD-10-CM

## 2018-03-31 DIAGNOSIS — R0600 Dyspnea, unspecified: Secondary | ICD-10-CM | POA: Diagnosis not present

## 2018-03-31 DIAGNOSIS — I428 Other cardiomyopathies: Secondary | ICD-10-CM

## 2018-03-31 DIAGNOSIS — R0902 Hypoxemia: Secondary | ICD-10-CM

## 2018-03-31 DIAGNOSIS — I10 Essential (primary) hypertension: Secondary | ICD-10-CM

## 2018-03-31 DIAGNOSIS — J4530 Mild persistent asthma, uncomplicated: Secondary | ICD-10-CM

## 2018-03-31 DIAGNOSIS — Z9989 Dependence on other enabling machines and devices: Secondary | ICD-10-CM

## 2018-03-31 DIAGNOSIS — E039 Hypothyroidism, unspecified: Secondary | ICD-10-CM

## 2018-03-31 DIAGNOSIS — J984 Other disorders of lung: Secondary | ICD-10-CM

## 2018-03-31 DIAGNOSIS — I5042 Chronic combined systolic (congestive) and diastolic (congestive) heart failure: Secondary | ICD-10-CM

## 2018-03-31 MED ORDER — IPRATROPIUM-ALBUTEROL 0.5-2.5 (3) MG/3ML IN SOLN: 3.0000 mL | Freq: Two times a day (BID) | RESPIRATORY_TRACT | 5 refills | Status: DC | PRN

## 2018-03-31 MED ORDER — ALBUTEROL SULFATE HFA 108 (90 BASE) MCG/ACT IN AERS: INHALATION_SPRAY | RESPIRATORY_TRACT | 5 refills | Status: DC

## 2018-03-31 MED ORDER — BUDESONIDE-FORMOTEROL FUMARATE 160-4.5 MCG/ACT IN AERO: INHALATION_SPRAY | RESPIRATORY_TRACT | 6 refills | Status: DC

## 2018-03-31 MED ORDER — CLONAZEPAM 1 MG PO TABS: ORAL_TABLET | ORAL | 5 refills | Status: DC

## 2018-03-31 NOTE — Progress Notes (Signed)
Subjective:     Patient ID: Meredith Diaz, female   DOB: 11/20/1980, 37 y.o.   MRN: [id number]  HPI 37 y/o WF, referred by DrCrawford for a pulmonary evaluation due to a hx of Asthma and increased DOE;  She has a complex hx of congenital heart dis and a cardiomyopathy w/ combined sys&diast CHF; hx anorexia as a teen=> morbid obesity as a young adult; & scoliosis/ chest wall deformity w/ pulmonary restrictive disease...   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[employer], pt of DrKollar & referred for pulmonary evaluation w/ hx asthma- she notes increased DOE & no relief from her inhalers> Estelita is a never smoker & was diagnosed w/ asthma first in 1992 at age 9 or so when Hosp for pneumonia (Cariology said she was hosp for CHF w/ EF=25-30% and was in hosp for 1mo) & she's had trouble ever since then, disch on NEBS;  Treated over the yrs w/ MDIs, Symbicort/Advair (the latter didn't help), last had Pred before 2013 hosp;  She was allergy tested in middle school & essentially neg x mild grass reaction by her memory;  She gets "sinus infections" a couple of times each yr (she thinks this is her main trigger) & uses Claritin/ Flonase prn;  Symptoms included SOB/ DOE w/ min activ, feels like she can't get a deep breath/ can't get enough air "in"/ not satisfied breathing (states this sensation is present all the time/constant, & she notes "gasping"); states she notes some wheezing when supine at night & some voice issues (?VCD, tight burning in throat) but denies reflux/ dysphagia/ nocturnal cough or choking (note- she has IBS & s/pGB 2013)... She also c/o sleep issues> can't sleep well while lying down, husb c/o her "wheezing"/ some snoring (but does not indicate that she stops breathing)/ restless sleeper/ leg jerks; she does have some daytime sleepiness issues w/ incr fatigue at work and she'll take naps on weekends; ESS=12... Current Pulm Meds> Symbicort160-  2puffs daily, AlbutHFA for prn use...       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 Cumulative data from cardiac evals as follows, she was told her cardiac issues are ?congenital, ?virus damaged her heart>> she has never had cardiac rehab etc...   CXR 2013 at [facility] showed cardiomegaly, biapical pleural thickening, otherw clear & NAD...  Cardiac MRI from [facility] in 2013 showed EF=52% w/ mild global HK, min AI, RV- wnl, Ao- wnl, mild pectus excavqatum w/ deviation of cardiac apex...  Cath was done in 2013 but I cannot find that report (said no signif CAD)...  EKG 11/15 showed STachy, rate 103, ?LAE, suggestive LVH, NSSTTWA...   2DEcho 11/15 by DrHarding showed mild LVH, ?poss apical variant hypertrophic cardiomyopathy, EF=40-45% w/ diffuse HK, Gr1DD, MV- mildly thickened leaflets and trivMR, norm LA size...  Current Meds> Metoprolol-ER25mg/d, Apresoline25-1.5tabsTid, Lasix40-1.5tabs/d, Zaroxyln2.5mg/d She has Hx ANOREXIA=>obesity> currently 203# (this is her max wt she says), 5' tall, BMI=39.6; she states that she's lost weight in the past & that she felt much better when her weight was down... Notes hx anorexia/ depression when in high school w/ lowest wt=80 lbs; psyche rx & counseling resulted in wt gain & she was ~150 lbs in college; gained to   180# after college & lost wt to 165 prior to her marriage at age 30; now she is 37 y/o & has gained to max 203#... She is allergic to ACE inhibitors w/ prev angioedema...  EXAM reveals Afeb, VSS, O2sat=93% on RA at rest;  HEENT- neg, mallampati 2, voice sl hoarse;  Chest- decr BS bilat, clear, decr diaph excursions by percussion, freq sign breaths;  Heart- RRR w/p m/r/g;  Ext- no c/c/e...  Recent LABS> Chems- wnl;  CBC- ok w/ Hg=12.4, WBC=14K;  TSH=2.62;  B12 level= 180-250 (on  oral B12- 1000mcg/d)...  CXR 08/16/14 showed norm heart size, clear lungs, NAD...  Spirometry 08/16/14> poor effort (breathing "high" in her lung volumes)> FVC=0.84 (27%), FEV1=0.66 (24%), %1sec=78, mid-flows reduced at 18% predicted; Test indicated severe restriction w/ small airways dis...   Ambulatory oxygen saturations> O2sat=95% on RA at rest w/ pulse=89/min;  She walked 2 laps and stopped due to SOB- O2sat=83% w/ pulse 121/min...  FENO= 10ppb (results <25ppd implies absence of eosinophilic airway inflammation).  Epworth Sleepiness Score= 12 (indicating that she may be excessively sleepy depending on the situation... IMP/PLAN>>  Clarence has been diagnosed w/ asthma over the last 20+yrs but her current symptoms and spirometry looks more restricted- prob due to "chest wall factors" as we discussed;  She has additional issues from obesity- r/o OSA, and from her Cardiac disease- hx congenital HD w/ surg, HBP, cardiomyopathy w/ combined sys/diast CHF- followed & treated by Cards, DrHarding... We discussed the nature of her dyspnea and the need for additional testing and a trial of KLONOPIN 0.5mg Bid... We will sched FullPFTs and a Sleep Study and have her return after that...  ~  August 26, 2014:  2wk ROV & [name] returns having completed her Full PFTs and Sleep study> she has in addition been on the Klonopin 0.5mg Bid but notes no improvement in her breathing on this dose (tol well just no better)...  Full PFTs 08/25/14 showed FVC=0.87 (26%), FEV1=0.77(27%), %1sec=88, mid-flows reduced at 28% predicted; after bronchodil- the FEV1 improved 6%; Lung volumes reduced w/ TLC=1.88 (42%), RV=0.85 (66%), RV/TLC=45 (some air trapping); DLCO=25% predicted, but corrects to normal (102%) when alveolar ventilation is taken into account...  Sleep Polysomnogram 08/18/14> Mod OSA w/ AHI=20/hr and REM AHI=54, mod snoring, desaturation to 64%...  IMP/PLANS>>  PFTs severely restricted (?effort/ accuracy) & seems to be  mostly related to obesity/ chest wall and not to underlying lung parenchymal factors (CXR clear, exam clear, we will consider hi-res CTChest later if needed); In this regard we will increase the KLONOPIN to 1mgBid before giving up on this med; she understands how important wt reduction is to her overall improvement- may want to consider eating disorder counseling again... Her Sleep study reveals mod OSA & nocturnal hypoxemia- I discussed w/ DrClance & we decided on CPAP set up> RESMED S10 machine- air/auto w/ heated humidity & climate controlled tubing, set on AUTO 5-15, enroll in AirView, mask interface of choice per DME company & pt preference... We will plan ROV recheck in 6 weeks to review compliance & efficacy, then consider doing an ONO...   ~  October 14, 2014:  6wk ROV & Valleri has lost 2# in the interval but not able to exercise- walks some, gets winded, stairs are difficult but able to negotiate one flight; we reviewed how critically important wt reduction is for her;  She still appears sl hoarse to me, scratchy voice which she believes is due to "allergy issues" => rec ENT evaluation for   completeness;  She increased the Klonopin to 60mBid & this is helping some she says- not as SOB at rest, able to get a deeper breath she thinks, etc;  Using CPAP satis w/ good compliance & download data showing AHI<1/hr on CPAP; she has nasal pillows but wants full face mask & we will let [employer] know... We reviewed the following medical problems>>     DYSPNEA- multifactorial> ?asthma is a small component w/ restrictive dis due to obesity & scoliosis as a major factor, and CHF, deconditioning, anxiety as additional factors...    OSA>  Mod OSA on sleep study 4/16 w/ AHI=20/hr and REM AHI=54, mod snoring, desaturation to 64%; started on CPAP via [employer], auto 5-15 w/ good compliance => AHI <1/hr on the CPAP autoset 5-15, she wants to change nasal pillows to full face mask, we will check w/ [employer], then do ONO...    Hx  asthma> on Symbicort160- 2sp daily, VentolinHFA rescue prn- using 2-3x/wk; PFT 4/16 w/ severe restriction, + small airways dis & min response to bronchodil; asked to continue inhalers for now...    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    Hx HBP> on MetopER25, Apres25- 1.5 tabsTid, Lasix40- 1.5tabsQam, Zaroxyln2.5 prn swelling- taking it almost every day; [facility]> allergic to ACE w/ angioedema of lips in past; BP= 130/88 today w/o CP etc...    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    Eating disorder/ Morbid Obesity> unusual hx of eating disorder w/ ANOREXIA as teen=> OBESITY w/ wt=201#, 5' Tall, BMI=39-40; she notes hx anorexia/ depression when in high school w/ lowest wt=80 lbs; psyche rx & counseling resulted in wt gain & she was ~150 lbs in college; gained to 180# after college & lost wt to 165 prior to her marriage at age 89(she felt better when wt was down); now she is 37y/o & has gained to max 203#...    Hypothyroidism> on Synthroid50; Labs 12/15 showed TSH=2.62, FreeT4=0.87    Hx IBS & s/p GB in 2013> aware...    Scoliosis & back pain> [facility], she does not recall the details, no records available, she notes legs get tired, she is not falling, notes that her hands shake on occas... This is contrib to her pulmonary restriction and dyspnea => need back films then [facility]  [facility]...    VitB12 defic> Labs showed B12 blood level 179-248 and she is rec to take B12 100175m orally Qd w/ recheck levels...     Anxiety/ depression> on Klonopin 75m76mid at present... EXAM reveals Afeb, VSS, O2sat=94% on RA at rest; HEENT- neg, mallampati 2, voice sl hoarse;  Chest- decr BS bilat, clear, decr diaph excursions by percussion, occas sign breaths;  Heart- RRR w/p m/r/g;  Ext- no c/c/e.  [facility] w/ Cr=1.04, BS=83, A1c=5.8;  FLP- at  goals on diet alone...  [employer] AirView download> 5/11 - 10/13/14 showed good compliance w/ AHI <1/hr on the CPAP autoset 5-15... PLAN>>  She has a very complex medical situation as outlined above, and we are hampered by lack of old records (I have asked her to help in getting this data);  For now she will continue the Symbicort, Ventolin, Klonopin, & CPAP; we will have [employer] look into full face mask per her preference; then proceed w/ ONO;  She knows the importance of wt reduction & may want to consider counseling from Dr. Thomes Dinning for eating disorder;  Continue cardiac meds and f/u w/ DrHarding;  She is only 37 y/o w/ signif pulm restriction & heart disease;  We will XRay her spine in anticipation of getting old records from [place] referral if this is playing a signif roll as well...  ~  December 14, 2014:  15moROV & ELularreturns for follow up> stable on her current regimen; she has multifactorial dyspnea>     OSA>  Mod OSA on sleep study 4/16 w/ AHI=20/hr and REM AHI=54, mod snoring, desaturation to 64%; started on CPAP via [employer], auto 5-15 w/ good compliance => AHI <1/hr on the CPAP autoset 5-15, she changed nasal pillows to full face mask, we will check download on FM, then do ONO...    Hx asthma> on Symbicort160- 2sp daily, VentolinHFA rescue prn- using 2-3x/wk; PFT 4/16 w/ severe restriction, + small airways dis & min response to bronchodil; asked to continue  inhalers for now...    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    Hx HBP> on MetopER25, Apres25- 1.5 tabsTid, Lasix40- 1.5tabsQam, Zaroxyln2.5 prn swelling- taking it almost every day; [facility]> allergic to ACE w/ angioedema of lips in past; BP= 130/88 today w/o CP etc...    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    Eating disorder/ Morbid Obesity> unusual hx of eating disorder w/ ANOREXIA as teen=> OBESITY w/ wt=204#, 5' Tall, BMI=39-40; she notes hx anorexia/ depression when in high school w/ lowest wt=80 lbs; psyche rx & counseling resulted in wt gain & she was ~150 lbs in college; gained to 180# after college & lost wt to 165 prior to her marriage at age 37(she felt better when wt was down); now she is 37y/o & has gained to max 204#... EXAM reveals Afeb, VSS, O2sat=95% on RA at rest; HEENT- neg, mallampati 2, voice sl hoarse;  Chest- decr BS bilat, clear, decr diaph excursions by percussion, occas sign breaths;  Heart- RRR w/p m/r/g;  Ext- no c/c/e. PLAN>>  ECucawill soon start cardiac rehab w/ it's exercise & nutritional counseling- it is imperitive that she get the weight down!!!  We will check CPAP download on full face mask & then ONO on the CPAP set up to be sure that she is not desaturating...  ADDENDUM>>  CPAP download 8/10 - 01/13/15 on autoset 5-15 and now  w/ FULL FaceMask showed 90% compliance, ave 5-6H per night,  AHI=0.6.Marland KitchenMarland Kitchen Continue same. ADDENDUM>>  ONO done 01/13/15 on CPAP on RA showed O2sat=88% for 25.10mn of the 7H study; lowest O2sat=63%; oxygen desat index=51 desat events per hr; she qualifies for O2 bleed-in at 2L/min for her CPAP unit...  ~  March 17, 2015:  327moOV & ElLadenas in Cardiac Rehab per DrHarding, sl improved & planning to continue in maintenance program;  She notes that her breathing is "ok" except w/ weather changes, she's been using the O2 bled into her CPAP Qhs; states she awakes at 3-4AM & can't get back to sleep- she remains on Klonopin 68m68mid, she tried TylenoloPM w/o help, tried her Lorazepam w/o help, we discussed trial of DESYREL25-50Qhs...     [facility] w/ CP> known nonischemic cardiomyop w/ EF=40-45%, stress test done by Cards 12/06/14 was neg for ischemia but showed poor exercise tol & hypertensive response; she had CTAngio in ER- neg for PE, CP resolved w/ MS; serial EKG & enz were neg- they added Aldactone and disch for Cards f/u...  She saw DrHarding 01/27/15 => meds adjusted & his note is reviewed, they plan rov in Dec...     She continues to f/u w/ DrKollar who started Cymbalta for depression- slight improved... EXAM reveals Afeb, VSS, O2sat=97% on RA at rest; HEENT- neg, mallampati 2, voice sl hoarse;  Chest- decr BS bilat, decr chest wall movement and decr diaph excursions by percussion, occas sign breaths, clear;  Heart- RRR w/p m/r/g;  Ext- no c/c/e.  CXR 01/05/15 showed ?left pleural thickening, r/o effusion, no focal airsp dis, scoliosis...  CT Angio Chest 01/05/15 showed mild cardiomeg, no evid of PE, ectasia of Ao measuring 3.7cm, no adenopathy, no consolidation or effusion, min Atx at left base, fusion of several vertebrae, fusion of several post ribs bilat...  LABS 12/2014> K was low at 3.0=> supplemented to norm;  BS in the 120-130 range;  Cr in the 1.1-1.2 range;  Troponins=neg;  BNP=19;  CBC- wnl... IMP/PLAN>>  She has severe pulmonary  restriction- obesity, scoliosis, ankylosis;  Weight is down to 187# on diet & cards rehab exercises;  rec to continue current meds and we will try Desyrel 25-50 Qhs for sleep...  ~  June 20, 2015:  24mo46mo & last visit we continued same treatment regimen (encouraged diet, exercise, wt reduction) & added Desyrel25=>50Qhs to help her rest thru the night; ElliGussieorts that this was NOT working, still wakes at 3-4AYRC Worldwidels exhausted all the time, plus it caused her to ?rage & ?scream she says;  She continues in [facility] [facility] maintenance program & she endorses better stamina when walking but still feels SOB "can't get enough air" despite the Klonopin 68mg 568m; while being monitored during exercise she says her O2sats drop & I've asked her to check this formally & keep record to see if her ambulatory O2 needs modification (today in office her lowest O2sat=90% w/ exercise);  She tells me that DrCrawford (seen 2/17 c/o hair falling out, aching all over, exhausted, panic attacks) [facility] due to CBC w/ WBC=13-17K w/ prev norm diff; I suggested to the pt that her PCP may consider referral to Rheum for poss FM/CFS eval...     Hx asthma> on Symbicort160- 2sp daily, VentolinHFA rescue prn- using 1-2x/wk; PFT 4/16 w/ severe restriction, + small airways dis & min response to bronchodil; asked  to continue inhalers for now...    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    Hx HBP> on MetopER50, Diovan40Bid, Demadex20, Aldactone25, K20Bid, off Apres/ off Zarox; lab 2/17 w/ K=4.7 (norm renal & BS); Note> allergic to ACE w/ angioedema of lips in past; BP= 102/70 today w/o CP etc...    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   - Cardiac MRI (2013) with EF 52%, no delayed enhancement.   - LHC in 2013 without significant CAD.  - 11/15 echo with EF 40-45%.   - 7/16 echo with EF 40-45%, grade II diastolic dysfunction (Last 2DEcho 7/16 showed EF=40-45% w/ diffuse HK, Gr2DD, norm valves, norm LA size; PA & RV are wnl)  - ETT (8/16) with poor exercise tolerance but no ischemia.   - CPX (12/16): peak VO2 15.7 (23.5 adjusted for ideal body weight), VE/VCO2 slope 21, RER 1.15 => moderate functional limitation thought to be primarily due to restrictive lung physiology.     Eating disorder/ Morbid Obesity> unusual hx of eating disorder w/ ANOREXIA as teen=> OBESITY w/ wt=204#, 5' Tall, BMI=39-40; she notes hx anorexia/ depression when in high school w/ lowest wt=80 lbs; psyche rx & counseling resulted in wt gain & she was ~150 lbs in college; gained to 180# after college & lost wt to 165 prior to her marriage at age 7 (she felt better when wt was down); now she is 37 y/o & has gained to max 204#...    Hypothyroidism> on Synthroid88; Labs 2/17 showed TSH=2.19...    Hx IBS & s/p GB in 2013> aware...    Scoliosis & back pain> she was prev evaluated at [facility] by a back specialist, she does not recall the details, no records available, she notes legs get tired, she is not falling, notes that her hands shake on occas... This is contrib to her pulmonary restriction and dyspnea => need back films then consider referral the [facility] for their review & recommendations...    VitB12 defic> Labs showed B12 blood level 179-248 and she is rec to take B12 1081mg orally Qd w/ recheck levels; Labs 2/17 showed B12=313    Anxiety/ depression> on Klonopin 154mBid at present & uses Ativan1m10mrn... EXAM reveals Afeb, VSS, O2sat=98% on RA at rest; 5'  Tall, wt=188#, BMI=36-37;  HEENT- neg, mallampati 2, voice sl hoarse;  Chest- decr BS bilat, decr chest wall movement and decr diaph excursions by percussion, occas sign breaths, clear;  Heart- RRR w/p m/r/g;  Ext- no c/c/e.  Cardio-Pulm exercise test 04/13/15 by DrMcLean>  Severe restrictive dis w/ decr in MVV; O2 desat to 87% w/ peak exercise...   Ambulatory Oximetry 06/20/15> O2sat=96% on RA at rest w/ pulse=89/min; she ambulated 3 laps on RA w/ lowest O2sat=90% w/ pulse=121/min...  CPAP download from 05/21/15=>06/19/15 showed 28/30d >4H, averaging 5-6H per night, Autoset5-15 w/ pressures 6-10cm & AHI=0;  rec to continue same... IMP/PLAN>>  She will continue same meds, continue her diet & exercise at [facility] of O[facility]ats there; we  refilled her Ativan to use prn for her panic attacks which are undoubtedly playing a roll in her dyspnea events...   ~  November 15, 2015:  42moROV & EDeyracontinues in the Cardiac Rehab maintenance program where she has plateaued but still working hard; Approx 137mogo she fell w/ pain in back & knee, she saw DrZSmith & benefitted from brief course of Pred;  She reports incr SOB, chest feels tight, some wheezing noted esp in hot humid weather;  On Symbicort160-2spBid, AlbutHFA prn, she notes that [facility], notes Singulair didn't help in the past; we decided to add INCRUSE one puff daily & reminded to use her IS regularly for the restrictive lung component... See prob list above...    EXAM reveals Afeb, VSS, O2sat=98% on RA at rest; 5' Tall, wt=186#, BMI=36;  HEENT- neg, mallampati 2, voice sl hoarse;  Chest- decr BS bilat, decr chest wall movement and decr diaph excursions by percussion, occas sign breaths, clear;  Heart- RRR w/p m/r/g;  Ext- no c/c/e.  CPAP download 6/10 - 11/13/15>>  CPAP used 30/30 days, ave ~6H per night, Autoset 5-15, ave pressure= 11-12, working very well w/ AHI=0.1, small leak...  LABS 11/15/15>  CBC- Hg=12.9, mcv=87, wbc=19.1, eos=1%;   IgE=61;  RAST panel- MOD for molds, BORDERLINE for grasses/ few trees/ ragweed... IMP/PLAN>>  She is getting B12 shots from her PCP- DrCrawford;  We discussed continuing the Symbicort160-2spBid & AlbutHFA rescue inhaler prn; We are adding Incruse once daily; Reminded to use the IS frequently to expand the lungs w/ her chest wall restricted dis...  ~  February 15, 2016:  71m42moV & EllDelisia18-Feb-1989o grandmother passed away suddenly last month ([place] after femoral hernia surg);  We reviewed mult medical problems during today's office visit >>     She saw DrCrawford 01/02/16 for medical f/u- note reviewed, mood stable on Cymbalta & klonopin, exercising at cards rehab, using CPAP (she wanted another sleep test since she's lost 20 lbs), taking B12, taking thyroid;  She is also followed by DrZSmith- seen 12/2015 w/ several issues and his note is reviewed (back pain improved and neuropathy symptoms better on Neurontin)...    She reports that her breathing has been OK but the humidity is a particular prob for her- she was seen by PCP 01/13/16 w/ incr chest congestion, cough & SOB, T99.5, incr use of rescue inhaler, chest was reported clear & she was Dx w/ sinusitis & givenAugmentin, Pred, Tussionex    Hx asthma> on Symbicort160- 2sp daily, Spiriva Respimat-2sp/d, VentolinHFA rescue prn- using 1-2x/wk; PFT 4/16 w/ severe restriction, + small airways dis & min response to bronchodil; asked to continue inhalers for now...    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    Hx HBP> on MetopER50, Diovan40Bid, Demadex20, Aldactone25, K20Bid, off Apres/ off Zarox; lab 2/17 w/ K=4.7 (norm renal & BS); Note> allergic to ACE w/ angioedema of lips in past; BP= 110/80 today w/o CP etc...    ?[facility] congenital heart disease w/ [place] in childhood[place] [facility],  [facility] ?Goldenhar Syndrome...    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    MEDICAL  Issues> Eating disorder w/ anorexia as teen=> obesity as adult; hypothy, IBS, scoliosis & back pain, B12 defic, anxiety/ depression...     EXAM reveals Afeb, VSS, O2sat=98% on RA at rest; 5' Tall, wt=194#, BMI=37;  HEENT- neg, mallampati 2, voice sl hoarse;  Chest- decr BS bilat, decr chest wall movement and decr diaph excursions by percussion, occas sigh breaths, clear;  Heart- RRR w/p m/r/g;  Ext- no c/c/e. IMP/PLAN>>  Aviyanna is stable on her current regimen- we reviewed regular dosing of her inhalers, taking meds regularly, continued cards rehab exercises and DIET for weight reduction;  She would like some Augmentin875 to keep on hand this winter- OK; we plan rov 26mo sooner if needed prn...  ~  August 16, 2016:  675moOV & ElBrianeturns doing satis overall w/ her mult problems- no new complaints or concerns but there are mult issues to follow up today>>    She saw DrMcLean for CARDS in the CHF clinic 03/16/16> note reviewed, hx nonischemic cardiomyopathy, most recent Echo w/ EF=40-45% & diffuse HK, poor exercise tolerance, c/o SOB/ fatigue out of proportion to her CHF & he incr Demadex referred her to Rheum...    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    She now indicates that she is INTOL to CPAP, full face mask interface & she stopped it all on her own ~12/17 (didn't call etc), but she claims that she is resting well at night, sleeps thru the night, wakes refreshed, & not napping or  showing daytime hypersomnolence; she is also NOT using her O2 at night, says her husb does not c/o snoring, etc => needs ONO on RA now...    DYSPNEA (DOE)- multifactorial> ?asthma is a small component w/ restrictive dis due to obesity & scoliosis as a major factor, and CHF, deconditioning, anxiety as additional factors...    OSA, nocturnal hypoxemia>  Mod OSA on sleep study 4/16 w/ AHI=20/hr and REM AHI=54, mod snoring, desaturation to 64%; started on CPAP via [employer], auto 5-15 w/ good compliance => AHI <1/hr on the CPAP autoset 5-15, she changed nasal pillows to full face mask & download OK but f/u ONO w/ signif desats despite the CPAP- started on O2 2L/min bled into the machine... Then in Dec2017 she stopped CPAP on her own stating mask interface problems, too much noise, etc; currently NOT using CPAP or her O2 at night & states resting well, wakes refreshed, no daytime symptoms, & apparently not snoring => needs ONO on RA now...    Hx asthma> on Symbicort160- 2sp daily, Spiriva Respimat-2sp/d, VentolinHFA rescue prn- using 1-2x/wk; PFT 4/16 w/ severe restriction, + small airways dis & min response to bronchodil; asked to continue inhalers for now...    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    Hx HBP> on MetopER50, Diovan40Bid, Demadex20-2/d, Aldactone25, K20Bid; Note> allergic to ACE w/ angioedema of lips in past; BP= 114/70 today w/o CP etc...    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    MEDICAL Issues> Eating disorder w/ anorexia as teen=> obesity as adult; hypothy, IBS, scoliosis & back pain, B12 defic, anxiety/ depression; followed by DrCrawford...  EXAM reveals Afeb, VSS, O2sat=98% on RA at rest; 5' Tall, wt=200#, BMI=39;  HEENT- neg, mallampati 2, voice sl hoarse;  Chest- decr BS bilat, decr chest wall movement and decr diaph excursions by percussion, occas sigh breaths, clear;  Heart- RRR w/p m/r/g;  Ext- no c/c/e.  2DEcho 11/17 showed mildly reduced EF=45-50%, diffuse HK, diastolic parameters were wnl, valves OK, RV- ok but unable to est PAsys...  CXR 07/04/16 by PCP (independently reviewed by me in the PACS system) showed mild cardiomeg, sl eventration of left hemidiaph posteriorly, no infiltrates or effusions, NAD...  LABS 1/18 by Rheum>  elev Sed=52,  Elev CRP=38;  CPK- wnl... IMP/PLAN>>  Jameson has stopped her CPAP & noct O2 on her own due to prob w/ mask intrerface & noice from the machine; she feels that she is doing fine off the CPAP but prev studies showed mod OSA & signif O2desat at night=> we will check ONO on RA (as she has been for the last 3mo) ASAP to see if she needs the O2 by [place] at night;  She is asked to get on diet, get wt down, [facility]...  ~  ADDENDUM:  ONO on RA done 08/19/16>  It showed a nadir O2sat of 56% and she spent 45min <88% qualifying her for nocturnal O2 at 2L/min Qhs => pt instructed to start back on her nocturnal O2 at 2+L/min Qhs...  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    Elluis has a hx asthma + restrictive lung physiology due to her scoliosis/ chest wall deformity + obesity, along w/ exercise & nocturnal hypoxemia, and OSA on sleep study 4/16;  All this produces multifactorial dyspnea which appeared to stabilize on Rx w/ Klonopin=> Ativan, Symbicort160, Spiriva, Albut HFA prn, Diet w/ wt loss heavily recommended + a regular exercise program; In addition she was started on CPAP & initially did very well... Unfortunately she has NOT been about to diet effectively or lose any weight (recall her unusual hx of anorexia in high school & subseq conversion to morbid obesity in adult life), and she has yet to join a gym/ exercise program (she did cardiac rehab in 2016-2017 &   maint program thru 06/2016 and quit) or do it on her own;  She has also stopped using her CPAP on her own in late 2017 even after changing face mask to nasal pillows=> she declined to restart & we checked ONO again documenting nocturnal hypoxemia=> told to use O2 at 2L/min by [place] reularly...  NOTE: husb confirms today that she is still snoring a lot & NOT resting well- I offetred her a sleep consult but she declines and says she will restart her home CPAP=> offered appt w/ sleep lab ([facility]) for mask desensitization etc...  We reviewed the following medical problems during today's office visit>     DYSPNEA (DOE)- multifactorial> ?asthma is a small component w/ restrictive dis due to obesity & scoliosis as a major factor, and CHF, deconditioning, anxiety as additional factors... we reviewed need for diet, wt reduction, & reg exercise program!  She has Ativan 1mg to take 1/2 - 1tab tid  prn anxiety...    OSA, nocturnal & exercise hypoxemia>  Mod OSA on sleep study 4/16 w/ AHI=20/hr and REM AHI=54, mod snoring, desaturation to 64%; started on CPAP via [employer], auto 5-15 w/ good compliance => AHI <1/hr on the CPAP autoset 5-15, she changed nasal pillows to full face mask & download OK but f/u ONO w/ signif desats despite the CPAP- started on O2 2L/min bled into the machine... Then in Dec2017 she stopped CPAP on her own stating mask interface problems, too much noise, etc; currently NOT using CPAP or her O2 at night & states resting well, wakes refreshed, no daytime symptoms, & she says "not snoring" but husb contradicts => needs ONO on RA confirmed continued desat & asked to restart both CPAP & O2 at 2L/min Qhs... 10/18 she indicated that she would restart her CPAP rather than see one of our sleep specialists for consult...    Hx asthma> on Symbicort160- 2sp daily, off Spiriva/ Incruse due to cost, VentolinHFA rescue prn- using 1-2x/wk; PFT 4/16 w/ severe restriction, + small airways dis & min response to bronchodil; ok to stop anticholinergics...    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    Hx HBP> on MetopER50, Diovan40Bid, Demadex20-2/d, Aldactone25, K20Bid; Note> allergic to ACE w/ angioedema of lips in past; BP= 116/78 today w/o CP etc...    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    MEDICAL  Issues> Eating disorder w/ anorexia as teen=> obesity as adult; hypothy, IBS, scoliosis & back pain, chr pain syndrome, B12 defic, anxiety/ depression; followed by DrCrawford; s/p Rheum eval DrBeekman- prob FM/CFS EXAM reveals Afeb, VSS, O2sat=93% on RA at rest; 5' Tall, wt=204#, BMI=39;  HEENT- neg, mallampati 2, voice sl hoarse;  Chest- decr BS bilat, decr chest wall movement and decr diaph excursions by percussion, occas sigh breaths, clear;  Heart- RRR w/p m/r/g;  Abd- soft, nontender, neg;  Back- scoliosis deformity;  Ext- no c/c/e. IMP/PLAN>>  We had a   long talk w/ pt, husb & myself regarding her multisystem dis & the need to take her meds & use her O2 regularly + use her CPAP nightly, + get on track w/ diet & exercise to lose weight!  She agrees to restart the CPAP & will call me in 1mo to consider check CPAP download,vs desens w/ [facility], vs. Sleep consult...   ~  March 31, 2018:  1yr ROV & pulmonary follow up visit>          Past Medical History  Diagnosis Date    DYSPNEA> multifactorial- restrictive lung dis, morbid obesity, deconditioning, etc OSA> Sleep study 08/2014 w/ AHI=20, worse in REM, desat to 64%... Hx anorexia => morbid obesity...    . Chronic diastolic congestive heart failure, NYHA class 2 07/2011    Echo: EF was 25-30%; No Sig CAD on CATH; No significant finding on Cardiac MRI; follow-up Ech0 2014 --  EF of 50%   Restrictive pulm physiology due to combination of scoliosis/ chest wall deformity/ morbid obesity => severe restriction by PFT   . Hx Asthma but mostly restriction on PFTs - no better on Symbicort160-2spBid + Spiriva vs Incruse once daily (we stopped the latter med)   . Thyroid disease  >>  On Synthroid50, TSH 12/15 = 2.62   . Depression   . Anxiety   . Essential hypertension   . Stomach problems   . Hypertension   . 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 Cardiomyopathy     Unclear etiology. Last EF by echo 40-45% with global hypokinesis, mild LVH possible apical variant hypertrophic cardiomyopathy        B12 Deficiency >> on oral B12 supplement 1000mcg/d;  Labs showed B12 level 178 => 313    Past Surgical History:  Procedure Laterality Date  . CARDIAC CATHETERIZATION  08/2011   Normal Coronaries - LVEDP 32 mmHg  . Cardiac MRI  April 2013   No suggestion of a congenital abnormality; EF ~ 52% - no regional wall motion abnormalities., no increased myocardial signal intensity. No perfusion abnormality.  . CHOLECYSTECTOMY  2013  . TRANSTHORACIC ECHOCARDIOGRAM  07/2011   Moderate concentric LVH, mildly dilated. EF 25-30% no diastolic dysfunction parameters to suggest elevated LAP  . 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 Normal wall thickness (compared to prior reading of possible apical LVH), grade 2 DD -- with elevated LV filling pressures (however left atrium was read as normal size), normal artery function and size. No no suggestion of pulmonary hypertension    Outpatient Encounter Medications as of 03/31/2018  Medication Sig  . albuterol (VENTOLIN HFA) 108 (90 Base) MCG/ACT inhaler INHALE 1-2 PUFFS INTO THE LUNGS EVERY 6 (SIX) HOURS AS NEEDED FOR WHEEZING OR SHORTNESS OF BREATH.  . budesonide-formoterol (SYMBICORT) 160-4.5 MCG/ACT inhaler TAKE 2 PUFFS BY MOUTH TWICE A DAY  . clobetasol cream (TEMOVATE) 0.05 % Apply 1 application topically See admin instructions. Apply daily to affected areas of genital area as directed  . doxycycline (DORYX) 100 MG EC tablet Take 100 mg by mouth 2 (two) times daily.  . DULoxetine (CYMBALTA) 60 MG capsule TAKE 1 CAPSULE (60 MG TOTAL) BY MOUTH DAILY. (Patient taking differently: Take 60   mg by mouth daily. )   . ipratropium-albuterol (DUONEB) 0.5-2.5 (3) MG/3ML SOLN Take 3 mLs by nebulization 2 (two) times daily as needed (For wheezing or shoortness of breath).  [name] levothyroxine (SYNTHROID, LEVOTHROID) 125 MCG tablet Take 1 tablet (125 mcg total) by mouth daily. (Patient taking differently: Take 125 mcg by mouth at bedtime. )  . metoprolol succinate (TOPROL-XL) 25 MG 24 hr tablet Take 1 tablet (25 mg total) by mouth at bedtime.  [name] spironolactone (ALDACTONE) 25 MG tablet Take 1 tablet (25 mg total) by mouth daily.  [name] torsemide (DEMADEX) 20 MG tablet Take 2 tablets (40 mg total) by mouth daily.  . valsartan (DIOVAN) 80 MG tablet Take 1 tablet (80 mg total) by mouth daily.  . [DISCONTINUED] ALPRAZolam (XANAX) 1 MG tablet Take 1 mg by mouth daily as needed for anxiety.  . [DISCONTINUED] budesonide-formoterol (SYMBICORT) 160-4.5 MCG/ACT inhaler TAKE 2 PUFFS BY MOUTH TWICE A DAY (Patient taking differently: Inhale 2 puffs into the lungs 2 (two) times daily. )  . [DISCONTINUED] dextromethorphan-guaiFENesin (MUCINEX DM) 30-600 MG 12hr tablet Take 2 tablets by mouth 2 (two) times daily.  . [DISCONTINUED] ipratropium-albuterol (DUONEB) 0.5-2.5 (3) MG/3ML SOLN Take 3 mLs by nebulization 2 (two) times daily as needed (For wheezing or shoortness of breath).  . [DISCONTINUED] metFORMIN (GLUCOPHAGE) 500 MG tablet Take 1 tablet (500 mg total) by mouth daily with breakfast.  . [DISCONTINUED] VENTOLIN HFA 108 (90 Base) MCG/ACT inhaler INHALE 1-2 PUFFS INTO THE LUNGS EVERY 6 (SIX) HOURS AS NEEDED FOR WHEEZING OR SHORTNESS OF BREATH. (Patient taking differently: Inhale 1-2 puffs into the lungs every 6 (six) hours as needed for wheezing or shortness of breath. )  . clonazePAM (KLONOPIN) 1 MG tablet Take 1/2-1 tab, by mouth twice a day   No facility-administered encounter medications on file as of 03/31/2018.     Allergies  Allergen Reactions  . Lisinopril Swelling and Other (See Comments)    Angioedema facial, mostly just  lips, no trouble breathing    Immunization History  Administered Date(s) Administered  . Influenza,inj,Quad PF,6-35 Mos 02/08/2018  . Influenza-Unspecified 01/15/2014, 02/04/2015, 02/14/2015, 02/07/2016  . Pneumococcal Polysaccharide-23 03/12/2018  . Tdap 09/26/2015    Current Medications, Allergies, Past Medical History, Past Surgical History, Family History, and Social History were reviewed in [employer] record.   Review of Systems             All symptoms NEG except where BOLDED >>  Constitutional:  Denies F/C/S, anorexia, unexpected weight change. HEENT:  No HA, visual changes, earache, nasal symptoms, sore throat, hoarseness. Resp:  cough, sputum, hemoptysis; SOB, tightness, wheezing. Cardio:  CP, palpit, DOE, orthopnea, edema. GI:  Denies N/V/D/C or blood in stool; no reflux, abd pain, distention, or gas. GU:  No dysuria, freq, urgency, hematuria, or flank pain. MS:  Denies joint pain, swelling, tenderness, or decr ROM; no neck pain, back pain, etc. Neuro:  No tremors, seizures, dizziness, syncope, weakness, numbness, gait abn. Skin:  No suspicious lesions or skin rash. Heme:  No adenopathy, bruising, bleeding. Psyche: Denies confusion, sleep disturbance, hallucinations, anxiety, depression.   Objective:   Physical Exam    Vital Signs:  Reviewed...   General:  WD, overweight, 37 y/o WF in NAD; alert & oriented; pleasant & cooperative... HEENT:  [place]/AT; Conjunctiva- pink, Sclera- nonicteric, EOM-wnl, PERRLA, EACs-clear, TMs-wnl; NOSE-clear; THROAT-clear & wnl.  Neck:  Supple w/ fair ROM; no JVD; normal carotid impulses w/o bruits; no thyromegaly or nodules palpated; no lymphadenopathy.  Chest:  Clear to P & A; without wheezes, rales, or rhonchi heard (decr BS at bases) Heart:  Regular Rhythm; gr1/6 SEM, without rubs or gallops detected. Abdomen:  Obese, soft, nontender- no guarding or rebound; normal bowel sounds; no organomegaly or masses  palpated. Ext:  Normal ROM; without deformities or arthritic changes; no varicose veins, +venous insuffic, tr edema;  Pulses intact w/o bruits. Neuro:  No focal neuro deficits, gait normal & balance OK. Derm:  No lesions noted; no rash etc. Lymph:  No cervical, supraclavicular, axillary, or inguinal adenopathy palpated.   Assessment:         DYSPNEA (DOE)- multifactorial>     OSA, nocturnal & exercise hypoxemia>  She stopped CPAP on her own 12/17 & finally agreed to restart 10/18...    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    MEDICAL Issues> Eating disorder w/ anorexia as teen=> obesity as adult; hypothy, IBS, scoliosis & back pain, chr pain syndrome, B12 defic, anxiety/ depression; followed by DrCrawford; s/p Rheum eval DrBeekman- prob FM/CFS   PLAN>>  She has a very complex medical situation as outlined above, and we are hampered by lack of old records (I have asked her to help in getting this data);  For now she will continue the Symbicort, Ventolin, Klonopin, & CPAP; She knows the importance of wt reduction & may want to consider counseling from Dr. Thomes Dinning for eating disorder;  Continue cardiac meds and f/u w/ DrHarding;  She is only 37 y/o w/ signif pulm restriction & heart disease... 12/14/14> Fleta will soon start cardiac rehab w/ it's exercise & nutritional counseling- it is imperitive that she get the weight down!!!  We will check CPAP download on full face mask & then ONO on the CPAP set up to be sure that she is not desaturating... ROV planned 3 months 03/17/15> She has severe pulmonary restriction- obesity, scoliosis, ankylosis;  Weight is down to 187# on diet & cards rehab exercises;  rec to  continue current meds and we will try Desyrel 25=50 Qhs for sleep. 06/20/15>  She will continue same meds, continue her diet & exercise at [place] keep record of O2sats there; we refilled her Ativan to use prn for her panic attacks which are undoubtedly playing a roll in her dyspnea. 11/15/15>   She is getting B12 shots from her PCP- DrCrawford;  We discussed continuing the Symbicort160-2spBid & AlbutHFA rescue inhaler prn; We are adding Incruse once daily; Reminded to use the IS frequently to expand the lungs w/ her chest wall restricted dis. 02/15/16>   Kristol is stable on her current regimen- we reviewed regular dosing of her inhalers, taking meds regularly, continued cards rehab exercises and DIET for weight reduction;  She would like some Augmentin875 to keep on hand this winter- OK. 08/16/16>    Dann has stopped her CPAP & noct O2 on her own due to prob w/ mask intrerface & noice from the machine; she feels that she is doing fine off the CPAP but prev studies showed mod OSA & signif O2desat at night=> we will check ONO on RA (as she has been for the last 14mo ASAP to see if she needs the  O2 by [place] at night;  She is asked to get on diet, get wt down, [facility] 02/18/17>    We had a long talk w/ pt, husb & myself regarding her multisystem dis & the need to take her meds & use her O2 regularly + use her CPAP nightly, + get on track w/ diet & exercise to lose weight!  She agrees to restart the CPAP & will call me in 51moto consider check CPAP download,vs desens w/ VLynnae Sandhoff vs. Sleep consult...     Plan:     Patient's Medications  New Prescriptions   CLONAZEPAM (KLONOPIN) 1 MG TABLET    Take 1/2-1 tab, by mouth twice a day  Previous Medications   CLOBETASOL CREAM (TEMOVATE) 0.05 %    Apply 1 application topically See admin instructions. Apply daily to affected areas of genital area as directed   DOXYCYCLINE (DORYX) 100 MG EC TABLET    Take 100 mg by mouth 2 (two) times daily.    DULOXETINE (CYMBALTA) 60 MG CAPSULE    TAKE 1 CAPSULE (60 MG TOTAL) BY MOUTH DAILY.   LEVOTHYROXINE (SYNTHROID, LEVOTHROID) 125 MCG TABLET    Take 1 tablet (125 mcg total) by mouth daily.   METOPROLOL SUCCINATE (TOPROL-XL) 25 MG 24 HR TABLET    Take 1 tablet (25 mg total) by mouth at bedtime.   SPIRONOLACTONE (ALDACTONE) 25 MG TABLET    Take 1 tablet (25 mg total) by mouth daily.   TORSEMIDE (DEMADEX) 20 MG TABLET    Take 2 tablets (40 mg total) by mouth daily.   VALSARTAN (DIOVAN) 80 MG TABLET    Take 1 tablet (80 mg total) by mouth daily.  Modified Medications   Modified Medication Previous Medication   ALBUTEROL (VENTOLIN HFA) 108 (90 BASE) MCG/ACT INHALER VENTOLIN HFA 108 (90 Base) MCG/ACT inhaler      INHALE 1-2 PUFFS INTO THE LUNGS EVERY 6 (SIX) HOURS AS NEEDED FOR WHEEZING OR SHORTNESS OF BREATH.    INHALE 1-2 PUFFS INTO THE LUNGS EVERY 6 (SIX) HOURS AS NEEDED FOR WHEEZING OR SHORTNESS OF BREATH.   BUDESONIDE-FORMOTEROL (SYMBICORT) 160-4.5 MCG/ACT INHALER budesonide-formoterol (SYMBICORT) 160-4.5 MCG/ACT inhaler      TAKE 2 PUFFS BY MOUTH TWICE A DAY    TAKE 2 PUFFS BY MOUTH TWICE A DAY   IPRATROPIUM-ALBUTEROL (DUONEB) 0.5-2.5 (3) MG/3ML SOLN ipratropium-albuterol (DUONEB) 0.5-2.5 (3) MG/3ML SOLN      Take 3 mLs by nebulization 2 (two) times daily as needed (For wheezing or shoortness of breath).    Take 3 mLs by nebulization 2 (two) times daily as needed (For wheezing or shoortness of breath).  Discontinued Medications   ALPRAZOLAM (XANAX) 1 MG TABLET    Take 1 mg by mouth daily as needed for anxiety.   DEXTROMETHORPHAN-GUAIFENESIN (MUCINEX DM) 30-600 MG 12HR TABLET    Take 2 tablets by mouth 2 (two) times daily.   METFORMIN (GLUCOPHAGE) 500 MG TABLET    Take 1 tablet (500 mg total) by mouth daily with breakfast.

## 2018-03-31 NOTE — Addendum Note (Signed)
Encounter addended by: Graciella Freer, PA-C on: 03/31/2018 8:39 AM  Actions taken: LOS modified

## 2018-03-31 NOTE — Patient Instructions (Signed)
Today we updated your med list in our EPIC system...     We reviewed your recent hospitalization & your current med regimen...  We discussed using the DUONEB med via the nebulizer twice daily (AM & before dinner)...    Continue the Symbicort160- 2sprays twice daily (before lunch & bedtime)  We also prescribed a combination relaxer to help relax the chest wall muscles & allow you to get the air IN...    Use the new KLONOPIN 1mg  tabs- taking 1/2 to 1 tab twice daily...  Today we did a walk test on room air=>   We will arrange for you to see one of my Sleep/Pulmonary patrners in about 3 months...  OK to return to the cardiac rehab program-- diet + exercise are the keys to weight reduction...    And weight reduction is the key to breathing better, less shortness of breath, etc...  Call for any issues or if we can be of service in any way.Marland KitchenMarland Kitchen

## 2018-04-01 NOTE — Telephone Encounter (Signed)
Called pt in regards to Pulmonary Rehab, pt stated she spoke with her doctor and her doctor would like her to join Cardiac Rehab. After checking we did recv'd a referral for CR.   Will close her PR referral.

## 2018-04-07 ENCOUNTER — Other Ambulatory Visit (HOSPITAL_COMMUNITY): Payer: Self-pay

## 2018-04-08 DIAGNOSIS — Z01419 Encounter for gynecological examination (general) (routine) without abnormal findings: Secondary | ICD-10-CM | POA: Diagnosis not present

## 2018-04-08 DIAGNOSIS — Z1231 Encounter for screening mammogram for malignant neoplasm of breast: Secondary | ICD-10-CM | POA: Diagnosis not present

## 2018-04-08 DIAGNOSIS — Z6841 Body Mass Index (BMI) 40.0 and over, adult: Secondary | ICD-10-CM | POA: Diagnosis not present

## 2018-04-09 LAB — HM PAP SMEAR

## 2018-04-10 ENCOUNTER — Other Ambulatory Visit: Payer: Self-pay | Admitting: Obstetrics & Gynecology

## 2018-04-10 DIAGNOSIS — R928 Other abnormal and inconclusive findings on diagnostic imaging of breast: Secondary | ICD-10-CM

## 2018-04-12 DIAGNOSIS — J45909 Unspecified asthma, uncomplicated: Secondary | ICD-10-CM | POA: Diagnosis not present

## 2018-04-14 ENCOUNTER — Other Ambulatory Visit: Payer: Commercial Managed Care - PPO

## 2018-04-15 ENCOUNTER — Ambulatory Visit
Admission: RE | Admit: 2018-04-15 | Discharge: 2018-04-15 | Disposition: A | Discharge status: Home / Self Care | Payer: Commercial Managed Care - PPO | Source: Ambulatory Visit | Attending: Obstetrics & Gynecology | Admitting: Obstetrics & Gynecology

## 2018-04-15 ENCOUNTER — Other Ambulatory Visit: Payer: Self-pay | Admitting: Obstetrics & Gynecology

## 2018-04-15 DIAGNOSIS — R928 Other abnormal and inconclusive findings on diagnostic imaging of breast: Secondary | ICD-10-CM

## 2018-04-15 DIAGNOSIS — N6320 Unspecified lump in the left breast, unspecified quadrant: Secondary | ICD-10-CM

## 2018-04-15 DIAGNOSIS — N6489 Other specified disorders of breast: Secondary | ICD-10-CM | POA: Diagnosis not present

## 2018-04-16 ENCOUNTER — Other Ambulatory Visit (INDEPENDENT_AMBULATORY_CARE_PROVIDER_SITE_OTHER): Payer: Commercial Managed Care - PPO

## 2018-04-16 ENCOUNTER — Ambulatory Visit (INDEPENDENT_AMBULATORY_CARE_PROVIDER_SITE_OTHER): Payer: Commercial Managed Care - PPO | Admitting: Internal Medicine

## 2018-04-16 ENCOUNTER — Telehealth (HOSPITAL_COMMUNITY): Payer: Self-pay

## 2018-04-16 ENCOUNTER — Encounter: Payer: Self-pay | Admitting: Internal Medicine

## 2018-04-16 VITALS — BP 100/70 | HR 85 | Temp 98.8°F | Ht 59.0 in | Wt 222.0 lb

## 2018-04-16 DIAGNOSIS — I5042 Chronic combined systolic (congestive) and diastolic (congestive) heart failure: Secondary | ICD-10-CM

## 2018-04-16 DIAGNOSIS — R7301 Impaired fasting glucose: Secondary | ICD-10-CM

## 2018-04-16 DIAGNOSIS — E039 Hypothyroidism, unspecified: Secondary | ICD-10-CM | POA: Diagnosis not present

## 2018-04-16 DIAGNOSIS — F4323 Adjustment disorder with mixed anxiety and depressed mood: Secondary | ICD-10-CM | POA: Diagnosis not present

## 2018-04-16 DIAGNOSIS — I5041 Acute combined systolic (congestive) and diastolic (congestive) heart failure: Secondary | ICD-10-CM | POA: Diagnosis not present

## 2018-04-16 LAB — T4, FREE: Free T4: 0.91 ng/dL (ref 0.60–1.60)

## 2018-04-16 LAB — HEMOGLOBIN A1C: Hgb A1c MFr Bld: 6.4 % (ref 4.6–6.5)

## 2018-04-16 LAB — TSH: TSH: 2.44 u[IU]/mL (ref 0.35–4.50)

## 2018-04-16 LAB — BRAIN NATRIURETIC PEPTIDE: Pro B Natriuretic peptide (BNP): 24 pg/mL (ref 0.0–100.0)

## 2018-04-16 MED ORDER — VORTIOXETINE HBR 10 MG PO TABS: 10.0000 mg | ORAL_TABLET | Freq: Every day | ORAL | 2 refills | Status: DC

## 2018-04-16 NOTE — Patient Instructions (Addendum)
We will check the labs for thyroid today.   We will switch the cymbalta to trintellix that you can start when you get it.

## 2018-04-16 NOTE — Telephone Encounter (Signed)
Pt insurance is active and benefits verified through [facility]. Co-pay $0.00, DED $4,000.00/$4,000.00 met, out of pocket $6,000.00/$4,022.01 met, co-insurance 20%. No pre-authorization required. Le/[facility], 04/16/18 @ 11:14AM, REF# [id number]

## 2018-04-16 NOTE — Telephone Encounter (Signed)
Called patient to see if she was interested in participating in the Cardiac Rehab Program. Patient stated yes. Patient will come in for orientation on 06/03/2018 @ 8AM and will attend the 645AM exercise class.  Mailed homework package.  went over insurance, patient verbalized understanding.

## 2018-04-16 NOTE — Progress Notes (Signed)
   Subjective:    Patient ID: Meredith Diaz, female    DOB: [birth year], 37 y.o.   MRN: [id number]  HPI The patient is a 37 YO female coming in for several concerns including continued fatigue (not sure if thyroid dosing is right, she does have hair thinning, denies missing synthroid) and lack of motivation (she is feeling very down and depressed about her health, she has been in the hospital with serious medical problems several times in the last few years and feels she has had progressive decline, is scared of not making any gains again, taking her cymbalta and maybe helped some, has tried many other things in the past with not great results, got side effects with lexapro, many others before that, denies SI/HI), and impaired sugars (some sugar levels have been up on recent labs, weight is up some from last year).   Review of Systems  Constitutional: Negative.   HENT: Negative.   Eyes: Negative.   Respiratory: Negative for cough, chest tightness and shortness of breath.   Cardiovascular: Negative for chest pain, palpitations and leg swelling.  Gastrointestinal: Negative for abdominal distention, abdominal pain, constipation, diarrhea, nausea and vomiting.  Musculoskeletal: Negative.   Skin: Negative.   Neurological: Negative.   Psychiatric/Behavioral: Negative.       Objective:   Physical Exam Constitutional:      Appearance: She is well-developed.  HENT:     Head: Normocephalic and atraumatic.  Neck:     Musculoskeletal: Normal range of motion.  Cardiovascular:     Rate and Rhythm: Normal rate and regular rhythm.  Pulmonary:     Effort: Pulmonary effort is normal. No respiratory distress.     Breath sounds: Normal breath sounds. No wheezing or rales.  Abdominal:     General: Bowel sounds are normal. There is no distension.     Palpations: Abdomen is soft.     Tenderness: There is no abdominal tenderness. There is no rebound.  Skin:    General: Skin is warm and dry.    Neurological:     Mental Status: She is alert and oriented to person, place, and time.     Coordination: Coordination normal.  Psychiatric:     Comments: Some flat affect    Vitals:   04/16/18 0930  BP: 100/70  Pulse: 85  Temp: 98.8 F (37.1 C)  TempSrc: Oral  SpO2: 97%  Weight: 222 lb (100.7 kg)  Height: 4\' 11"  (1.499 m)      Assessment & Plan:

## 2018-04-17 DIAGNOSIS — R7301 Impaired fasting glucose: Secondary | ICD-10-CM | POA: Insufficient documentation

## 2018-04-17 NOTE — Assessment & Plan Note (Signed)
Checking TSH and free T4 and adjust synthroid 125 mcg daily as needed. 

## 2018-04-17 NOTE — Assessment & Plan Note (Signed)
Checking HgA1c with weight change to make sure no new problems.

## 2018-04-17 NOTE — Assessment & Plan Note (Signed)
Checking BNP and adjust dosing if needed.

## 2018-04-17 NOTE — Assessment & Plan Note (Signed)
Checking BNP although weight is not up as much at home. Taking medications appropriately.

## 2018-04-17 NOTE — Assessment & Plan Note (Signed)
Change cymbalta to trintellix 10 mg daily and samples given to start as well as savings card.

## 2018-04-20 DIAGNOSIS — G4733 Obstructive sleep apnea (adult) (pediatric): Secondary | ICD-10-CM | POA: Diagnosis not present

## 2018-04-23 ENCOUNTER — Ambulatory Visit (HOSPITAL_COMMUNITY)
Admission: RE | Admit: 2018-04-23 | Discharge: 2018-04-23 | Disposition: A | Discharge status: Home / Self Care | Payer: Commercial Managed Care - PPO | Source: Ambulatory Visit | Attending: Cardiology | Admitting: Cardiology

## 2018-04-23 ENCOUNTER — Telehealth: Payer: Self-pay | Admitting: Pulmonary Disease

## 2018-04-23 DIAGNOSIS — I5042 Chronic combined systolic (congestive) and diastolic (congestive) heart failure: Secondary | ICD-10-CM | POA: Insufficient documentation

## 2018-04-23 LAB — BASIC METABOLIC PANEL
Anion gap: 11 (ref 5–15)
BUN: 10 mg/dL (ref 6–20)
CO2: 29 mmol/L (ref 22–32)
CREATININE: 0.85 mg/dL (ref 0.44–1.00)
Calcium: 8.7 mg/dL — ABNORMAL LOW (ref 8.9–10.3)
Chloride: 96 mmol/L — ABNORMAL LOW (ref 98–111)
GFR calc Af Amer: >60 mL/min (ref >60)
GFR calc non Af Amer: >60 mL/min (ref >60)
Glucose, Bld: 131 mg/dL — ABNORMAL HIGH (ref 70–99)
Potassium: 4.9 mmol/L (ref 3.5–5.1)
Sodium: 136 mmol/L (ref 135–145)

## 2018-04-23 NOTE — Telephone Encounter (Signed)
Pt states she has been with [facility] for O2 since 2016/2017 time frame. Pt uses O2 as needed during the day and QHS; states she has not gotten her POC as requested-there are issues with the DME and payment concerns(billing Insurance company); pt states she took care of that issue but would like to change companies.   Pt aware that I will contact other DME's to make sure this can be done.

## 2018-04-23 NOTE — Telephone Encounter (Signed)
Office number: [phone]

## 2018-04-24 ENCOUNTER — Telehealth (HOSPITAL_COMMUNITY): Payer: Self-pay | Admitting: Cardiology

## 2018-04-24 DIAGNOSIS — I5042 Chronic combined systolic (congestive) and diastolic (congestive) heart failure: Secondary | ICD-10-CM

## 2018-04-24 NOTE — Telephone Encounter (Signed)
-----   Message from Graciella Freer, PA-C sent at 04/23/2018 10:26 AM EST ----- Please confirm how she is taking her potassium. Can hold for 1 day and decrease to 20 meq daily if she was taking as directed.     Casimiro Needle [address]" [place], PA-C 04/23/2018 10:26 AM

## 2018-04-24 NOTE — Telephone Encounter (Signed)
Notes recorded by Theresia Bough, CMA on 04/24/2018 at 10:10 AM EST Patient aware. Patient reports she is taking potassium 20 meq daily-advised to hold x 1 day and then d/c will recheck x 1 week to make sure she doesn't drop low again (ok per Ashley,NP) Repeat labs 12/31 as patient will be out of town for the holidays  ------  Notes recorded by Graciella Freer, PA-C on 04/23/2018 at 10:26 AM EST Please confirm how she is taking her potassium. Can hold for 1 day and decrease to 20 meq daily if she was taking as directed.     Casimiro Needle [address]" [place], PA-C 04/23/2018 10:26 AM

## 2018-04-24 NOTE — Telephone Encounter (Signed)
Spoke with Ashly at [facility] is working on this with his local office and will keep me updated on status of patient getting POC and financial side of things. Ashly will have Marchelle Folks at [facility] call patient and go over things. Will hold in my basket for updates

## 2018-04-28 NOTE — Telephone Encounter (Signed)
Ashly with [facility] has taken care of this; Nothing more needed at this time.

## 2018-05-06 ENCOUNTER — Encounter: Payer: Self-pay | Admitting: Pulmonary Disease

## 2018-05-06 ENCOUNTER — Ambulatory Visit (HOSPITAL_COMMUNITY)
Admission: RE | Admit: 2018-05-06 | Discharge: 2018-05-06 | Disposition: A | Discharge status: Home / Self Care | Payer: Commercial Managed Care - PPO | Source: Ambulatory Visit | Attending: Cardiology | Admitting: Cardiology

## 2018-05-06 DIAGNOSIS — I5042 Chronic combined systolic (congestive) and diastolic (congestive) heart failure: Secondary | ICD-10-CM

## 2018-05-06 LAB — BASIC METABOLIC PANEL
Anion gap: 9 (ref 5–15)
BUN: 16 mg/dL (ref 6–20)
CO2: 32 mmol/L (ref 22–32)
Calcium: 8.6 mg/dL — ABNORMAL LOW (ref 8.9–10.3)
Chloride: 96 mmol/L — ABNORMAL LOW (ref 98–111)
Creatinine, Ser: 0.9 mg/dL (ref 0.44–1.00)
GFR calc Af Amer: >60 mL/min (ref >60)
GFR calc non Af Amer: >60 mL/min (ref >60)
Glucose, Bld: 108 mg/dL — ABNORMAL HIGH (ref 70–99)
Potassium: 4.4 mmol/L (ref 3.5–5.1)
Sodium: 137 mmol/L (ref 135–145)

## 2018-05-06 NOTE — Telephone Encounter (Signed)
Attempted to call Cyndie Chime, left message to call back Thursday, phones off at this time. Called and spoke with Mathis Fare.  Per previous notes, Ashly with [facility] and Florentina Addison, nothing further was needed, everything was taken care of.  Myriam Jacobson stated that Patient should be receiving O2 shortly, because everything on there end was completed. She was going to reach to [facility] today, so the Patient will be updated.  Responded to Patient's e mail to let her know that I have spoke with [facility], and she should be hearing from them soon.  Will follow up Thursday.

## 2018-05-13 DIAGNOSIS — J45909 Unspecified asthma, uncomplicated: Secondary | ICD-10-CM | POA: Diagnosis not present

## 2018-05-22 ENCOUNTER — Encounter: Payer: Self-pay | Admitting: Internal Medicine

## 2018-05-22 NOTE — Progress Notes (Signed)
Abstracted and sent to scan  

## 2018-05-28 ENCOUNTER — Telehealth (HOSPITAL_COMMUNITY): Payer: Self-pay | Admitting: Pharmacy Technician

## 2018-05-29 ENCOUNTER — Telehealth (HOSPITAL_COMMUNITY): Payer: Self-pay

## 2018-05-29 NOTE — Telephone Encounter (Signed)
Pt called experiencing severe SOB, weight gain of 8 lbs since 05/26/2018, hands and feet are puffy. Pt states she is anxious about her symptoms. Pt states the O2 is not helping. She is currently taking torsemide 20 mg (Take 2 tablets (40 mg total) by mouth daily) and spiro 25 mg daily. Please advise.

## 2018-05-29 NOTE — Telephone Encounter (Signed)
Can take torsemide 40 mg BID for 2-3 days to get her weight back down.     Casimiro Needle [address]" [place], PA-C 05/29/2018 9:30 AM

## 2018-05-29 NOTE — Telephone Encounter (Signed)
Pt notified Verbalizes understanding 

## 2018-05-29 NOTE — Telephone Encounter (Signed)
Cardiac Rehab Medication Review by a Pharmacist  Does the patient  feel that his/her medications are working for him/her?  yes  Has the patient been experiencing any side effects to the medications prescribed?  no  Does the patient measure his/her own blood pressure or blood glucose at home?  no   Does the patient have any problems obtaining medications due to transportation or finances?   no  Understanding of regimen: fair Understanding of indications: fair Potential of compliance: fair  Pharmacist comments: Takes blood pressure once monthly; tends to be low end of normal. Some concern about Symbicort being very expensive, but still covered by insurance.   Harlow Mares, PharmD PGY1 Pharmacy Resident Phone [phone]  05/29/2018   2:04 PM

## 2018-05-30 ENCOUNTER — Telehealth (HOSPITAL_COMMUNITY): Payer: Self-pay | Admitting: *Deleted

## 2018-05-30 NOTE — Telephone Encounter (Signed)
-----   Message from Laurey Morale, MD sent at 05/30/2018 10:17 AM EST ----- Regarding: RE: oxygen at cardiac rehab yes ----- Message ----- From: Cammy Copa, RN Sent: 05/30/2018   9:37 AM EST To: Laurey Morale, MD Subject: oxygen at cardiac rehab                        Good morning Dr Shirlee Latch,  Meredith Diaz is coming to orientation for cardiac rehab on Tuesday. Can the patient wear oxygen at 2l/min to keep her oxygen saturations greater than 90%?  Thanks you for your input.  Sincerely,  Gladstone Lighter RN  Cardiac Rehab

## 2018-06-01 NOTE — Progress Notes (Signed)
Meredith Diaz 38 y.o. female DOB Nov 22, 1980 MRN [id number]       Nutrition Screener Note  No diagnosis found. Past Medical History:  Diagnosis Date  . Anxiety   . Asthma   . Back pain   . Chronic combined systolic and diastolic CHF (congestive heart failure) (HCC) 07/2011   a. Prior Echo: EF was 25-30%; b. No Sig CAD on CATH; c. No significant finding on Cardiac MRI; d. follow-up Echo 2014 --  EF of 50%;  e. 03/2014 Echo: EF 40-45%, gr 1 DD; f. 11/2014 Echo: EF 40-45%, Gr 2 DD.  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  . Depression   . Dyspnea   . Essential hypertension   . Family history of breast cancer   . Family history of colon cancer   . Fibromyalgia   . Gallbladder problem   . Hypothyroidism   . IBS (irritable bowel syndrome)   . Morbid obesity (HCC)   . Nonischemic cardiomyopathy (HCC)    a. unclear etiology;  b. 01/2012 Cardiac MRI: no infiltrative pathology, EF 52%;  b. 11/2014 Echo: EF 40-45%, gr 2 DD.  [name] Scoliosis   . Sleep apnea   . Stomach problems   . Thyroid disease   . Vitamin B 12 deficiency   . Vitamin D deficiency    Meds reviewed.    Current Outpatient Medications (Endocrine & Metabolic):  .  levothyroxine (SYNTHROID, LEVOTHROID) 125 MCG tablet, Take 1 tablet (125 mcg total) by mouth daily. (Patient taking differently: Take 125 mcg by mouth at bedtime. )  Current Outpatient Medications (Cardiovascular):  .  metoprolol succinate (TOPROL-XL) 25 MG 24 hr tablet, Take 1 tablet (25 mg total) by mouth at bedtime. [name]  spironolactone (ALDACTONE) 25 MG tablet, Take 1 tablet (25 mg total) by mouth daily. [name]  torsemide (DEMADEX) 20 MG tablet, Take 2 tablets (40 mg total) by mouth daily. .  valsartan (DIOVAN) 80 MG tablet, Take 1 tablet (80 mg total) by mouth daily.  Current Outpatient Medications  (Respiratory):  .  albuterol (VENTOLIN HFA) 108 (90 Base) MCG/ACT inhaler, INHALE 1-2 PUFFS INTO THE LUNGS EVERY 6 (SIX) HOURS AS NEEDED FOR WHEEZING OR SHORTNESS OF BREATH. .  budesonide-formoterol (SYMBICORT) 160-4.5 MCG/ACT inhaler, TAKE 2 PUFFS BY MOUTH TWICE A DAY .  ipratropium-albuterol (DUONEB) 0.5-2.5 (3) MG/3ML SOLN, Take 3 mLs by nebulization 2 (two) times daily as needed (For wheezing or shoortness of breath).    Current Outpatient Medications (Other):  .  clobetasol cream (TEMOVATE) 0.05 %, Apply 1 application topically See admin instructions. Apply daily to affected areas of genital area as directed .  clonazePAM (KLONOPIN) 1 MG tablet, Take 1/2-1 tab, by mouth twice a day (Patient taking differently: Take 1 mg by mouth 2 (two) times daily. ) .  doxycycline (DORYX) 100 MG EC tablet, Take 100 mg by mouth 2 (two) times daily. [name]  vortioxetine HBr (TRINTELLIX) 10 MG TABS tablet, Take 1 tablet (10 mg total) by mouth daily.   HT: Ht Readings from Last 1 Encounters:  04/16/18 4\' 11"  (1.499 m)    WT: Wt Readings from Last 5 Encounters:  04/16/18 222 lb (100.7 kg)  03/31/18 220 lb 3.2 oz (99.9 kg)  03/24/18 215 lb 6.4 oz (97.7 kg)  03/17/18 213 lb (96.6 kg)  03/13/18 217 lb 11.2 oz (  98.7 kg)      BMI = 44.81   Current tobacco use? No       Labs:  Lipid Panel     Component Value Date/Time   CHOL 197 08/07/2017 1116   TRIG 122 08/07/2017 1116   HDL 49 08/07/2017 1116   CHOLHDL 4 01/28/2014 1123   VLDL 19.2 01/28/2014 1123   LDLCALC 124 (H) 08/07/2017 1116    Lab Results  Component Value Date   HGBA1C 6.4 04/16/2018   CBG (last 3)  No results for input(s): GLUCAP in the last 72 hours.  Nutrition Diagnosis ? Food-and nutrition-related knowledge deficit related to lack of exposure to information as related to diagnosis of: ? CVD ? Pre-diabetes ? Obese  III = 40+ related to excessive energy intake as evidenced by a BMI = 44.81  Nutrition Goal(s):  ? To be  determined  Plan:  Pt to attend nutrition classes ? Nutrition I ? Nutrition II ? Portion Distortion  ? Diabetes Blitz ? Diabetes Q & A Will provide client-centered nutrition education as part of interdisciplinary care.   Monitor and evaluate progress toward nutrition goal with team.  Ross Marcus, MS, RD, LDN 06/01/2018 1:12 PM

## 2018-06-03 ENCOUNTER — Encounter (HOSPITAL_COMMUNITY)
Admission: RE | Admit: 2018-06-03 | Discharge: 2018-06-03 | Disposition: A | Discharge status: Home / Self Care | Payer: Commercial Managed Care - PPO | Source: Ambulatory Visit | Attending: Cardiology | Admitting: Cardiology

## 2018-06-03 ENCOUNTER — Encounter (HOSPITAL_COMMUNITY): Payer: Self-pay

## 2018-06-03 VITALS — Ht 60.0 in | Wt 225.1 lb

## 2018-06-03 DIAGNOSIS — I5042 Chronic combined systolic (congestive) and diastolic (congestive) heart failure: Secondary | ICD-10-CM | POA: Diagnosis not present

## 2018-06-03 NOTE — Progress Notes (Signed)
Meredith Diaz 38 y.o. female DOB: 03/01/1981 MRN: [id number]      Nutrition Note  No diagnosis found. Past Medical History:  Diagnosis Date  . Anxiety   . Asthma   . Back pain   . Chronic combined systolic and diastolic CHF (congestive heart failure) (HCC) 07/2011   a. Prior Echo: EF was 25-30%; b. No Sig CAD on CATH; c. No significant finding on Cardiac MRI; d. follow-up Echo 2014 --  EF of 50%;  e. 03/2014 Echo: EF 40-45%, gr 1 DD; f. 11/2014 Echo: EF 40-45%, Gr 2 DD.  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  . Depression   . Dyspnea   . Essential hypertension   . Family history of breast cancer   . Family history of colon cancer   . Fibromyalgia   . Gallbladder problem   . Hypothyroidism   . IBS (irritable bowel syndrome)   . Morbid obesity (HCC)   . Nonischemic cardiomyopathy (HCC)    a. unclear etiology;  b. 01/2012 Cardiac MRI: no infiltrative pathology, EF 52%;  b. 11/2014 Echo: EF 40-45%, gr 2 DD.  [name] Scoliosis   . Sleep apnea   . Stomach problems   . Thyroid disease   . Vitamin B 12 deficiency   . Vitamin D deficiency    Meds reviewed.   Current Outpatient Medications (Endocrine & Metabolic):  .  levothyroxine (SYNTHROID, LEVOTHROID) 125 MCG tablet, Take 1 tablet (125 mcg total) by mouth daily. (Patient taking differently: Take 125 mcg by mouth at bedtime. )  Current Outpatient Medications (Cardiovascular):  .  metoprolol succinate (TOPROL-XL) 25 MG 24 hr tablet, Take 1 tablet (25 mg total) by mouth at bedtime. [name]  spironolactone (ALDACTONE) 25 MG tablet, Take 1 tablet (25 mg total) by mouth daily. [name]  torsemide (DEMADEX) 20 MG tablet, Take 2 tablets (40 mg total) by mouth daily. .  valsartan (DIOVAN) 80 MG tablet, Take 1 tablet (80 mg total) by mouth daily.  Current Outpatient Medications (Respiratory):  .   albuterol (VENTOLIN HFA) 108 (90 Base) MCG/ACT inhaler, INHALE 1-2 PUFFS INTO THE LUNGS EVERY 6 (SIX) HOURS AS NEEDED FOR WHEEZING OR SHORTNESS OF BREATH. .  budesonide-formoterol (SYMBICORT) 160-4.5 MCG/ACT inhaler, TAKE 2 PUFFS BY MOUTH TWICE A DAY .  ipratropium-albuterol (DUONEB) 0.5-2.5 (3) MG/3ML SOLN, Take 3 mLs by nebulization 2 (two) times daily as needed (For wheezing or shoortness of breath).    Current Outpatient Medications (Other):  .  clobetasol cream (TEMOVATE) 0.05 %, Apply 1 application topically See admin instructions. Apply daily to affected areas of genital area as directed .  clonazePAM (KLONOPIN) 1 MG tablet, Take 1/2-1 tab, by mouth twice a day (Patient taking differently: Take 1 mg by mouth 2 (two) times daily. ) .  doxycycline (DORYX) 100 MG EC tablet, Take 100 mg by mouth 2 (two) times daily. [name]  vortioxetine HBr (TRINTELLIX) 10 MG TABS tablet, Take 1 tablet (10 mg total) by mouth daily.   HT: Ht Readings from Last 1 Encounters:  04/16/18 4\' 11"  (1.499 m)    WT: Wt Readings from Last 5 Encounters:  04/16/18 222 lb (100.7 kg)  03/31/18 220 lb 3.2 oz (99.9 kg)  03/24/18 215 lb 6.4 oz (97.7 kg)  03/17/18 213 lb (96.6 kg)  03/13/18 217 lb 11.2 oz (98.7 kg)  There is no height or weight on file to calculate BMI = 44.81  04/16/18   Current tobacco use? No  Labs:  Lipid Panel     Component Value Date/Time   CHOL 197 08/07/2017 1116   TRIG 122 08/07/2017 1116   HDL 49 08/07/2017 1116   CHOLHDL 4 01/28/2014 1123   VLDL 19.2 01/28/2014 1123   LDLCALC 124 (H) 08/07/2017 1116    Lab Results  Component Value Date   HGBA1C 6.4 04/16/2018   CBG (last 3)  No results for input(s): GLUCAP in the last 72 hours.  Nutrition Note Spoke with pt. Nutrition plan and goals reviewed with pt. Pt is following Step 2 of the Therapeutic Lifestyle Changes diet. Pt wants to lose wt. Pt has been trying to lose wt by seeing weight management center. Feels this ultimately did  not work for her d/t being highly restrictive. Additionally pt reports a history of disordered eating and felt that the meal plan set forth was triggering. Discussed with pt the importance of being inclusive of all foods groups but instead focusing on slowly decreasing portion sizes and slow reducing these over time while starting to incorporate in physical activity to create a caloric deficit. Heart healthy eating tips reviewed (label reading, how to build a healthy plate, portion sizes, eating frequently across the day). Pt last A1C was elevated. Discussed the differences between complex and refined carbs, recommended pt replace refined carbs with complex. Reviewed the benefits swapping in complex carbs and moderating portion sizes can have on managing blood glucose with patient. Pt with dx of CHF. Per discussion, pt does not use canned/convenience foods often. Pt does not add salt to food. Pt does not eat out frequently. Pt expressed understanding of the information reviewed. Pt aware of nutrition education classes offered and would like to attend nutrition classes.  Nutrition Diagnosis ? Food-and nutrition-related knowledge deficit related to lack of exposure to information as related to diagnosis of: ? CVD   Nutrition Intervention ? Pt's individual nutrition plan and goals reviewed with pt. ? Pt given handouts for: ? Nutrition I class ? Nutrition II class  ? Intuitive/mindful eating   Nutrition Goal(s):  ? Pt to identify and limit food sources of saturated fat, trans fat, refined carbohydrates and sodium ? Pt to identify food quantities necessary to achieve weight loss of 6-24 lb at graduation from cardiac rehab.  ? Pt to build a healthy plate including vegetables, fruits, whole grains, and low-fat dairy products in a heart healthy meal plan. ? Pt able to name foods that affect blood glucose  Plan:  ? Pt to attend nutrition classes:  ? Nutrition I ? Nutrition II ? Portion Distortion  ? Will  provide client-centered nutrition education as part of interdisciplinary care ? Monitor and evaluate progress toward nutrition goal with team.   Ross Marcus, MS, RD, LDN 06/03/2018 11:06 AM

## 2018-06-03 NOTE — Progress Notes (Signed)
Cardiac Individual Treatment Plan  Patient Details  Name: Meredith Diaz MRN: [id number] Date of Birth: 05/18/1980 Referring Provider:   Flowsheet Row CARDIAC REHAB PHASE II ORIENTATION from 06/03/2018 in [facility] CARDIAC [facility]  Referring Provider  Dr. Shirlee Latch      Initial Encounter Date:  Flowsheet Row CARDIAC REHAB PHASE II ORIENTATION from 06/03/2018 in [facility] CARDIAC REHAB  Date  06/03/18      Visit Diagnosis: 05/2018 Chronic combined systolic and diastolic CHF (congestive heart failure) (HCC)  Patient's Home Medications on Admission:  Current Outpatient Medications:  .  albuterol (VENTOLIN HFA) 108 (90 Base) MCG/ACT inhaler, INHALE 1-2 PUFFS INTO THE LUNGS EVERY 6 (SIX) HOURS AS NEEDED FOR WHEEZING OR SHORTNESS OF BREATH., Disp: 18 g, Rfl: 5 .  budesonide-formoterol (SYMBICORT) 160-4.5 MCG/ACT inhaler, TAKE 2 PUFFS BY MOUTH TWICE A DAY, Disp: 10.2 Inhaler, Rfl: 6 .  clobetasol cream (TEMOVATE) 0.05 %, Apply 1 application topically See admin instructions. Apply daily to affected areas of genital area as directed, Disp: , Rfl:  .  clonazePAM (KLONOPIN) 1 MG tablet, Take 1/2-1 tab, by mouth twice a day (Patient taking differently: Take 1 mg by mouth 2 (two) times daily. ), Disp: 60 tablet, Rfl: 5 .  doxycycline (DORYX) 100 MG EC tablet, Take 100 mg by mouth 2 (two) times daily., Disp: , Rfl:  .  ipratropium-albuterol (DUONEB) 0.5-2.5 (3) MG/3ML SOLN, Take 3 mLs by nebulization 2 (two) times daily as needed (For wheezing or shoortness of breath)., Disp: 180 mL, Rfl: 5 .  levothyroxine (SYNTHROID, LEVOTHROID) 125 MCG tablet, Take 1 tablet (125 mcg total) by mouth daily. (Patient taking differently: Take 125 mcg by mouth at bedtime. ), Disp: 90 tablet, Rfl: 2 .  metoprolol succinate (TOPROL-XL) 25 MG 24 hr tablet, Take 1 tablet (25 mg total) by mouth at bedtime., Disp: 30 tablet, Rfl: 6 .  spironolactone (ALDACTONE) 25 MG tablet, Take 1 tablet  (25 mg total) by mouth daily., Disp: 30 tablet, Rfl: 6 .  torsemide (DEMADEX) 20 MG tablet, Take 2 tablets (40 mg total) by mouth daily., Disp: 60 tablet, Rfl: 11 .  valsartan (DIOVAN) 80 MG tablet, Take 1 tablet (80 mg total) by mouth daily., Disp: 60 tablet, Rfl: 6 .  vortioxetine HBr (TRINTELLIX) 10 MG TABS tablet, Take 1 tablet (10 mg total) by mouth daily., Disp: 30 tablet, Rfl: 2  Past Medical History: Past Medical History:  Diagnosis Date  . Anxiety   . Asthma   . Back pain   . Chronic combined systolic and diastolic CHF (congestive heart failure) (HCC) 07/2011   a. Prior Echo: EF was 25-30%; b. No Sig CAD on CATH; c. No significant finding on Cardiac MRI; d. follow-up Echo 2014 --  EF of 50%;  e. 03/2014 Echo: EF 40-45%, gr 1 DD; f. 11/2014 Echo: EF 40-45%, Gr 2 DD.  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  . Depression   . Dyspnea   . Essential hypertension   . Family history of breast cancer   . Family history of colon cancer   . Fibromyalgia   . Gallbladder problem   . Hypothyroidism   . IBS (irritable bowel syndrome)   . Morbid obesity (HCC)   . Nonischemic cardiomyopathy (HCC)    a. unclear etiology;  b. 01/2012  Cardiac MRI: no infiltrative pathology, EF 52%;  b. 11/2014 Echo: EF 40-45%, gr 2 DD.  [name] Scoliosis   . Sleep apnea   . Stomach problems   . Thyroid disease   . Vitamin B 12 deficiency   . Vitamin D deficiency     Tobacco Use: Social History   Tobacco Use  Smoking Status Never Smoker  Smokeless Tobacco Never Used    Labs: Recent Review Flowsheet Data    Labs for ITP Cardiac and Pulmonary Rehab Latest Ref Rng & Units 01/28/2014 11/01/2014 06/15/2015 08/07/2017 04/16/2018   Cholestrol 100 - 199 mg/dL 161 - - 096 -   LDLCALC 0 - 99 mg/dL 045(W) - - 098(J) -   HDL >39 mg/dL 19.14 - - 49 -   Trlycerides 0 -  149 mg/dL 78.2 - - 956 -   Hemoglobin A1c 4.6 - 6.5 % - 5.7 5.6 5.9(H) 6.4      Capillary Blood Glucose: Lab Results  Component Value Date   GLUCAP 155 (H) 01/27/2015     Exercise Target Goals: Exercise Program Goal: Individual exercise prescription set using results from initial 6 min walk test and THRR while considering  patient's activity barriers and safety.   Exercise Prescription Goal: Initial exercise prescription builds to 30-45 minutes a day of aerobic activity, 2-3 days per week.  Home exercise guidelines will be given to patient during program as part of exercise prescription that the participant will acknowledge.  Activity Barriers & Risk Stratification: Activity Barriers & Cardiac Risk Stratification - 06/03/18 1154    Activity Barriers & Cardiac Risk Stratification          Activity Barriers  Joint Problems;Deconditioning;Muscular Weakness;Shortness of Breath;Other (comment)    Comments  O2 use. Hip pain.     Cardiac Risk Stratification  High           6 Minute Walk: 6 Minute Walk    6 Minute Walk    Row Name 06/03/18 1152   Phase  Initial   Distance  600 feet   Walk Time  6 minutes   # of Rest Breaks  1   MPH  1.1   METS  2.44   RPE  17   Perceived Dyspnea   3   VO2 Peak  8.55   Symptoms  Yes (comment)   Comments  SOB   Resting HR  84 bpm   Resting BP  104/60   Resting Oxygen Saturation   91 %   Exercise Oxygen Saturation  during 6 min walk  85 %   Max Ex. HR  116 bpm   Max Ex. BP  104/60   2 Minute Post BP  108/64          Oxygen Initial Assessment:   Oxygen Re-Evaluation:   Oxygen Discharge (Final Oxygen Re-Evaluation):   Initial Exercise Prescription: Initial Exercise Prescription - 06/03/18 1200    Date of Initial Exercise RX and Referring Provider          Date  06/03/18    Referring Provider  Dr. Shirlee Latch    Expected Discharge Date  09/12/18        Oxygen          Oxygen  Continuous        NuStep          Level  2     SPM  75    Minutes  20    METs  2  Track          Laps  8    Minutes  10    METs  2.38        Prescription Details          Frequency (times per week)  3    Duration  Progress to 30 minutes of continuous aerobic without signs/symptoms of physical distress        Intensity          THRR 40-80% of Max Heartrate  73-146    Ratings of Perceived Exertion  11-13    Perceived Dyspnea  0-4        Progression          Progression  Continue to progress workloads to maintain intensity without signs/symptoms of physical distress.        Resistance Training          Training Prescription  Yes    Weight  2 lbs.     Reps  10-15           Perform Capillary Blood Glucose checks as needed.  Exercise Prescription Changes:   Exercise Comments:   Exercise Goals and Review: Exercise Goals    Exercise Goals    Row Name 06/03/18 1155   Increase Physical Activity  Yes   Intervention  Provide advice, education, support and counseling about physical activity/exercise needs.;Develop an individualized exercise prescription for aerobic and resistive training based on initial evaluation findings, risk stratification, comorbidities and participant's personal goals.   Expected Outcomes  Short Term: Attend rehab on a regular basis to increase amount of physical activity.   Increase Strength and Stamina  Yes   Intervention  Provide advice, education, support and counseling about physical activity/exercise needs.;Develop an individualized exercise prescription for aerobic and resistive training based on initial evaluation findings, risk stratification, comorbidities and participant's personal goals.   Expected Outcomes  Short Term: Increase workloads from initial exercise prescription for resistance, speed, and METs.   Able to understand and use rate of perceived exertion (RPE) scale  Yes   Intervention  Provide education and explanation on how to use RPE scale   Expected Outcomes   Short Term: Able to use RPE daily in rehab to express subjective intensity level;Long Term:  Able to use RPE to guide intensity level when exercising independently   Able to understand and use Dyspnea scale  Yes   Intervention  Provide education and explanation on how to use Dyspnea scale   Expected Outcomes  Short Term: Able to use Dyspnea scale daily in rehab to express subjective sense of shortness of breath during exertion;Long Term: Able to use Dyspnea scale to guide intensity level when exercising independently   Knowledge and understanding of Target Heart Rate Range (THRR)  Yes   Intervention  Provide education and explanation of THRR including how the numbers were predicted and where they are located for reference   Expected Outcomes  Short Term: Able to state/look up THRR;Long Term: Able to use THRR to govern intensity when exercising independently;Short Term: Able to use daily as guideline for intensity in rehab   Able to check pulse independently  Yes   Intervention  Provide education and demonstration on how to check pulse in carotid and radial arteries.;Review the importance of being able to check your own pulse for safety during independent exercise   Expected Outcomes  Short Term: Able to explain why pulse checking is important during independent exercise;Long Term: Able to check pulse  independently and accurately   Understanding of Exercise Prescription  Yes   Intervention  Provide education, explanation, and written materials on patient's individual exercise prescription   Expected Outcomes  Short Term: Able to explain program exercise prescription;Long Term: Able to explain home exercise prescription to exercise independently          Exercise Goals Re-Evaluation :   Discharge Exercise Prescription (Final Exercise Prescription Changes):   Nutrition:  Target Goals: Understanding of nutrition guidelines, daily intake of sodium 1500mg , cholesterol 200mg , calories 30% from  fat and 7% or less from saturated fats, daily to have 5 or more servings of fruits and vegetables.  Biometrics: Pre Biometrics - 06/03/18 1155    Pre Biometrics          Height  5' (1.524 m)    Weight  102.1 kg    Waist Circumference  47 inches    Hip Circumference  53.5 inches    Waist to Hip Ratio  0.88 %    BMI (Calculated)  43.96    Triceps Skinfold  25 mm    % Body Fat  50.5 %    Grip Strength  24 kg    Flexibility  0 in    Single Leg Stand  22.06 seconds            Nutrition Therapy Plan and Nutrition Goals: Nutrition Therapy & Goals - 06/03/18 1114    Nutrition Therapy          Diet  heart healthy, carb modified        Personal Nutrition Goals          Nutrition Goal  Pt to identify and limit food sources of saturated fat, trans fat, refined carbohydrates and sodium    Personal Goal #2  Pt to build a healthy plate including vegetables, fruits, whole grains, and low-fat dairy products in a heart healthy meal plan    Personal Goal #3  Pt able to name foods that affect blood glucose    Personal Goal #4  Pt to identify food quantities necessary to achieve weight loss of 6-24 lb at graduation from cardiac rehab.         Intervention Plan          Intervention  Prescribe, educate and counsel regarding individualized specific dietary modifications aiming towards targeted core components such as weight, hypertension, lipid management, diabetes, heart failure and other comorbidities.    Expected Outcomes  Short Term Goal: Understand basic principles of dietary content, such as calories, fat, sodium, cholesterol and nutrients.;Long Term Goal: Adherence to prescribed nutrition plan.           Nutrition Assessments: Nutrition Assessments - 06/03/18 1115    MEDFICTS Scores          Pre Score  21           Nutrition Goals Re-Evaluation:   Nutrition Goals Re-Evaluation:   Nutrition Goals Discharge (Final Nutrition Goals Re-Evaluation):   Psychosocial: Target  Goals: Acknowledge presence or absence of significant depression and/or stress, maximize coping skills, provide positive support system. Participant is able to verbalize types and ability to use techniques and skills needed for reducing stress and depression.  Initial Review & Psychosocial Screening: Initial Psych Review & Screening - 06/03/18 1126    Initial Review          Current issues with  Current Stress Concerns;History of Depression;Current Anxiety/Panic;Current Depression    Source of Stress Concerns  Chronic Illness;Unable to participate  in former interests or hobbies;Unable to perform yard/household activities    Comments  declining health, self disappointment, feelings of failure and hopelessness        Family Dynamics          Good Support System?  Yes   husband, friends, therapist        Barriers          Psychosocial barriers to participate in program  There are no identifiable barriers or psychosocial needs.        Screening Interventions          Interventions  Encouraged to exercise;Provide feedback about the scores to participant    Expected Outcomes  Short Term goal: Utilizing psychosocial counselor, staff and physician to assist with identification of specific Stressors or current issues interfering with healing process. Setting desired goal for each stressor or current issue identified.;Long Term Goal: Stressors or current issues are controlled or eliminated.;Short Term goal: Identification and review with participant of any Quality of Life or Depression concerns found by scoring the questionnaire.;Long Term goal: The participant improves quality of Life and PHQ9 Scores as seen by post scores and/or verbalization of changes           Quality of Life Scores: Quality of Life - 06/03/18 1132    Quality of Life Scores          Health/Function Pre  8.97 %    Socioeconomic Pre  22 %    Psych/Spiritual Pre  11.5 %    Family Pre  21.4 %    GLOBAL Pre  14.24 %           Scores of 19 and below usually indicate a poorer quality of life in these areas.  A difference of  2-3 points is a clinically meaningful difference.  A difference of 2-3 points in the total score of the Quality of Life Index has been associated with significant improvement in overall quality of life, self-image, physical symptoms, and general health in studies assessing change in quality of life.  PHQ-9: Recent Review Flowsheet Data    Depression screen [facility] 2/9 04/16/2018 08/07/2017   Decreased Interest 3 2   Down, Depressed, Hopeless 3 3   PHQ - 2 Score 6 5   Altered sleeping 2 2   Tired, decreased energy 3 3   Change in appetite 2 3   Feeling bad or failure about yourself  3 3   Trouble concentrating 2 2   Moving slowly or fidgety/restless 2 2   Suicidal thoughts 0 0   PHQ-9 Score 20 20   Difficult doing work/chores - Very difficult     Interpretation of Total Score  Total Score Depression Severity:  1-4 = Minimal depression, 5-9 = Mild depression, 10-14 = Moderate depression, 15-19 = Moderately severe depression, 20-27 = Severe depression   Psychosocial Evaluation and Intervention:   Psychosocial Re-Evaluation:   Psychosocial Discharge (Final Psychosocial Re-Evaluation):   Vocational Rehabilitation: Provide vocational rehab assistance to qualifying candidates.   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 Participant will state understanding/return demonstration of topics presented.  Learning Barriers/Preferences: Learning Barriers/Preferences - 06/03/18 1156  Learning Barriers/Preferences          Learning Barriers  Sight;Exercise Concerns   Dizziness   Learning Preferences   Written Material;Skilled Demonstration           Education Topics: Count Your Pulse:  -Group instruction provided by verbal instruction, demonstration, patient participation and written materials to support subject.  Instructors address importance of being able to find your pulse and how to count your pulse when at home without a heart monitor.  Patients get hands on experience counting their pulse with staff help and individually.   Heart Attack, Angina, and Risk Factor Modification:  -Group instruction provided by verbal instruction, video, and written materials to support subject.  Instructors address signs and symptoms of angina and heart attacks.    Also discuss risk factors for heart disease and how to make changes to improve heart health risk factors.   Functional Fitness:  -Group instruction provided by verbal instruction, demonstration, patient participation, and written materials to support subject.  Instructors address safety measures for doing things around the house.  Discuss how to get up and down off the floor, how to pick things up properly, how to safely get out of a chair without assistance, and balance training.   Meditation and Mindfulness:  -Group instruction provided by verbal instruction, patient participation, and written materials to support subject.  Instructor addresses importance of mindfulness and meditation practice to help reduce stress and improve awareness.  Instructor also leads participants through a meditation exercise.    Stretching for Flexibility and Mobility:  -Group instruction provided by verbal instruction, patient participation, and written materials to support subject.  Instructors lead participants through series of stretches that are designed to increase flexibility thus improving mobility.  These stretches are additional exercise for major muscle groups that are typically performed during regular warm up and cool down.   Hands Only CPR:   -Group verbal, video, and participation provides a basic overview of AHA guidelines for community CPR. Role-play of emergencies allow participants the opportunity to practice calling for help and chest compression technique with discussion of AED use.   Hypertension: -Group verbal and written instruction that provides a basic overview of hypertension including the most recent diagnostic guidelines, risk factor reduction with self-care instructions and medication management.    Nutrition I class: Heart Healthy Eating:  -Group instruction provided by PowerPoint slides, verbal discussion, and written materials to support subject matter. The instructor gives an explanation and review of the Therapeutic Lifestyle Changes diet recommendations, which includes a discussion on lipid goals, dietary fat, sodium, fiber, plant stanol/sterol esters, sugar, and the components of a well-balanced, healthy diet.   Nutrition II class: Lifestyle Skills:  -Group instruction provided by PowerPoint slides, verbal discussion, and written materials to support subject matter. The instructor gives an explanation and review of label reading, grocery shopping for heart health, heart healthy recipe modifications, and ways to make healthier choices when eating out.   Diabetes Question & Answer:  -Group instruction provided by PowerPoint slides, verbal discussion, and written materials to support subject matter. The instructor gives an explanation and review of diabetes co-morbidities, pre- and post-prandial blood glucose goals, pre-exercise blood glucose goals, signs, symptoms, and treatment of hypoglycemia and hyperglycemia, and foot care basics.   Diabetes Blitz:  -Group instruction provided by PowerPoint slides, verbal discussion, and written materials to support subject matter. The instructor gives an explanation and review of the physiology behind type 1 and type 2 diabetes, diabetes medications and rational  behind  using different medications, pre- and post-prandial blood glucose recommendations and Hemoglobin A1c goals, diabetes diet, and exercise including blood glucose guidelines for exercising safely.    Portion Distortion:  -Group instruction provided by PowerPoint slides, verbal discussion, written materials, and food models to support subject matter. The instructor gives an explanation of serving size versus portion size, changes in portions sizes over the last 20 years, and what consists of a serving from each food group.   Stress Management:  -Group instruction provided by verbal instruction, video, and written materials to support subject matter.  Instructors review role of stress in heart disease and how to cope with stress positively.     Exercising on Your Own:  -Group instruction provided by verbal instruction, power point, and written materials to support subject.  Instructors discuss benefits of exercise, components of exercise, frequency and intensity of exercise, and end points for exercise.  Also discuss use of nitroglycerin and activating EMS.  Review options of places to exercise outside of rehab.  Review guidelines for sex with heart disease.   Cardiac Drugs I:  -Group instruction provided by verbal instruction and written materials to support subject.  Instructor reviews cardiac drug classes: antiplatelets, anticoagulants, beta blockers, and statins.  Instructor discusses reasons, side effects, and lifestyle considerations for each drug class.   Cardiac Drugs II:  -Group instruction provided by verbal instruction and written materials to support subject.  Instructor reviews cardiac drug classes: angiotensin converting enzyme inhibitors (ACE-I), angiotensin II receptor blockers (ARBs), nitrates, and calcium channel blockers.  Instructor discusses reasons, side effects, and lifestyle considerations for each drug class.   Anatomy and Physiology of the Circulatory System:  Group  verbal and written instruction and models provide basic cardiac anatomy and physiology, with the coronary electrical and arterial systems. Review of: AMI, Angina, Valve disease, Heart Failure, Peripheral Artery Disease, Cardiac Arrhythmia, Pacemakers, and the ICD.   Other Education:  -Group or individual verbal, written, or video instructions that support the educational goals of the cardiac rehab program.   Holiday Eating Survival Tips:  -Group instruction provided by PowerPoint slides, verbal discussion, and written materials to support subject matter. The instructor gives patients tips, tricks, and techniques to help them not only survive but enjoy the holidays despite the onslaught of food that accompanies the holidays.   Knowledge Questionnaire Score: Knowledge Questionnaire Score - 06/03/18 1134    Knowledge Questionnaire Score          Pre Score  23/24           Core Components/Risk Factors/Patient Goals at Admission: Personal Goals and Risk Factors at Admission - 06/03/18 1157    Core Components/Risk Factors/Patient Goals on Admission           Weight Management  Yes;Obesity;Weight Maintenance    Intervention  Weight Management: Develop a combined nutrition and exercise program designed to reach desired caloric intake, while maintaining appropriate intake of nutrient and fiber, sodium and fats, and appropriate energy expenditure required for the weight goal.;Weight Management: Provide education and appropriate resources to help participant work on and attain dietary goals.;Weight Management/Obesity: Establish reasonable short term and long term weight goals.;Obesity: Provide education and appropriate resources to help participant work on and attain dietary goals.    Admit Weight  225 lb 1.4 oz (102.1 kg)    Expected Outcomes  Short Term: Continue to assess and modify interventions until short term weight is achieved;Long Term: Adherence to nutrition and physical activity/exercise  program aimed toward  attainment of established weight goal;Weight Maintenance: Understanding of the daily nutrition guidelines, which includes 25-35% calories from fat, 7% or less cal from saturated fats, less than 200mg  cholesterol, less than 1.5gm of sodium, & 5 or more servings of fruits and vegetables daily;Understanding recommendations for meals to include 15-35% energy as protein, 25-35% energy from fat, 35-60% energy from carbohydrates, less than 200mg  of dietary cholesterol, 20-35 gm of total fiber daily;Understanding of distribution of calorie intake throughout the day with the consumption of 4-5 meals/snacks;Weight Loss: Understanding of general recommendations for a balanced deficit meal plan, which promotes 1-2 lb weight loss per week and includes a negative energy balance of [phone] kcal/d    Improve shortness of breath with ADL's  Yes    Intervention  Provide education, individualized exercise plan and daily activity instruction to help decrease symptoms of SOB with activities of daily living.    Expected Outcomes  Short Term: Improve cardiorespiratory fitness to achieve a reduction of symptoms when performing ADLs;Long Term: Be able to perform more ADLs without symptoms or delay the onset of symptoms    Heart Failure  Yes    Intervention  Provide a combined exercise and nutrition program that is supplemented with education, support and counseling about heart failure. Directed toward relieving symptoms such as shortness of breath, decreased exercise tolerance, and extremity edema.    Expected Outcomes  Improve functional capacity of life    Hypertension  Yes    Intervention  Provide education on lifestyle modifcations including regular physical activity/exercise, weight management, moderate sodium restriction and increased consumption of fresh fruit, vegetables, and low fat dairy, alcohol moderation, and smoking cessation.;Monitor prescription use compliance.    Expected Outcomes  Short Term:  Continued assessment and intervention until BP is < 140/54mm HG in hypertensive participants. < 130/75mm HG in hypertensive participants with diabetes, heart failure or chronic kidney disease.;Long Term: Maintenance of blood pressure at goal levels.    Lipids  Yes    Intervention  Provide education and support for participant on nutrition & aerobic/resistive exercise along with prescribed medications to achieve LDL 70mg , HDL >40mg .    Expected Outcomes  Short Term: Participant states understanding of desired cholesterol values and is compliant with medications prescribed. Participant is following exercise prescription and nutrition guidelines.;Long Term: Cholesterol controlled with medications as prescribed, with individualized exercise RX and with personalized nutrition plan. Value goals: LDL < 70mg , HDL > 40 mg.    Stress  Yes    Intervention  Offer individual and/or small group education and counseling on adjustment to heart disease, stress management and health-related lifestyle change. Teach and support self-help strategies.;Refer participants experiencing significant psychosocial distress to appropriate mental health specialists for further evaluation and treatment. When possible, include family members and significant others in education/counseling sessions.    Expected Outcomes  Short Term: Participant demonstrates changes in health-related behavior, relaxation and other stress management skills, ability to obtain effective social support, and compliance with psychotropic medications if prescribed.;Long Term: Emotional wellbeing is indicated by absence of clinically significant psychosocial distress or social isolation.           Core Components/Risk Factors/Patient Goals Review:    Core Components/Risk Factors/Patient Goals at Discharge (Final Review):    ITP Comments: ITP Comments    Row Name 06/03/18 1126   ITP Comments  Dr. Armanda Magic, Medical Director       Comments: Patient  attended orientation from 0804 to 0930 to review rules and guidelines for program. Completed 6 minute  walk test, Intitial ITP, and exercise prescription.  VSS. Telemetry-sinus rhythm,  Pt desaturated to 82% on 2L [place] POC.  Up to 85% with rest and PLB.  O2 continuous applied. Up to 96%.  Pt c/o fatigue and dyspnea.   Dr. Shirlee Latch and [facility] made aware.  Will continue to monitor.  Deveron Furlong, RN, BSN Cardiac Pulmonary Rehab 06/03/18 12:58 PM

## 2018-06-09 ENCOUNTER — Encounter (HOSPITAL_COMMUNITY)
Admission: RE | Admit: 2018-06-09 | Discharge: 2018-06-09 | Disposition: A | Discharge status: Home / Self Care | Payer: Commercial Managed Care - PPO | Source: Ambulatory Visit | Attending: Cardiology | Admitting: Cardiology

## 2018-06-09 DIAGNOSIS — I5042 Chronic combined systolic (congestive) and diastolic (congestive) heart failure: Secondary | ICD-10-CM | POA: Insufficient documentation

## 2018-06-09 NOTE — Progress Notes (Signed)
Daily Session Note  Patient Details  Name: Meredith Diaz MRN: [id number] Date of Birth: 1980/07/26 Referring Provider:     CARDIAC REHAB PHASE II ORIENTATION from 06/03/2018 in [facility]  Referring Provider  Dr. Aundra Dubin      Encounter Date: 06/09/2018  Check In: Session Check In - 06/09/18 0734      Check-In   Supervising physician immediately available to respond to emergencies  [facility] immediately available    Physician(s)  Dr. Waldron Labs    Location  [facility]    Staff Present  Andi Hence, RN, Deland Pretty, MS, ACSM CEP, Exercise Physiologist;Tyara Carol Ada, MS,ACSM CEP, Exercise Physiologist;Zaeden Lastinger Karle Starch, RN, BSN    Medication changes reported      No    Fall or balance concerns reported     No    Tobacco Cessation  No Change    Warm-up and Cool-down  Performed as group-led instruction    Resistance Training Performed  No    VAD Patient?  No    PAD/SET Patient?  No      Pain Assessment   Currently in Pain?  No/denies    Multiple Pain Sites  No       Capillary Blood Glucose: No results found for this or any previous visit (from the past 24 hour(s)).    Social History   Tobacco Use  Smoking Status Never Smoker  Smokeless Tobacco Never Used    Goals Met:  Exercise tolerated well  Goals Unmet:  Not Applicable  Comments: Pt started cardiac rehab today.  Pt tolerated light exercise without difficulty. VSS, telemetry-ST, asymptomatic.  Medication list reconciled. Pt denies barriers to medicaiton compliance.  PSYCHOSOCIAL ASSESSMENT:  PHQ-20. Pt with negative outlook.  Pt sees therapist currently and states that is is helpful.  Emotional support given to patient.  Pt oriented to exercise equipment and routine.    Understanding verbalized.    Dr. Fransico Him is Medical Director for Cardiac Rehab at [facility].

## 2018-06-10 ENCOUNTER — Encounter: Payer: Self-pay | Admitting: Pulmonary Disease

## 2018-06-10 ENCOUNTER — Ambulatory Visit (INDEPENDENT_AMBULATORY_CARE_PROVIDER_SITE_OTHER): Payer: Commercial Managed Care - PPO | Admitting: Pulmonary Disease

## 2018-06-10 VITALS — BP 110/74 | HR 85 | Ht 60.0 in | Wt 228.0 lb

## 2018-06-10 DIAGNOSIS — R0600 Dyspnea, unspecified: Secondary | ICD-10-CM | POA: Diagnosis not present

## 2018-06-10 NOTE — Progress Notes (Signed)
Meredith Diaz    [id number]    [birth year]  Primary Care Physician:Crawford, Austin Miles, MD  Referring Physician: Myrlene Broker, MD [address] [place], [place] [address]  Chief complaint:   Patient of Dr. Jodelle Green who comes in today for follow-up Shortness of breath with activity  HPI: Patient with a history of asthma Congestive heart failure History of cardiomyopathy systolic/diastolic congestive heart failure recently had a decompensation following which she was started on oxygen supplementation She was started on CPAP therapy for obstructive sleep apnea-trying to get used to it  She has noticed that she desaturates with ambulation and also notices that this happens during physical therapy   Gets short of breath with mild exertion  She has significant restrictive lung disease contributed to by her scoliosis and morbid obesity  History of hypothyroidism and chronic back pain  Multiple comorbidities contributing to current symptomatology  Outpatient Encounter Medications as of 06/10/2018  Medication Sig  . albuterol (VENTOLIN HFA) 108 (90 Base) MCG/ACT inhaler INHALE 1-2 PUFFS INTO THE LUNGS EVERY 6 (SIX) HOURS AS NEEDED FOR WHEEZING OR SHORTNESS OF BREATH.  . budesonide-formoterol (SYMBICORT) 160-4.5 MCG/ACT inhaler TAKE 2 PUFFS BY MOUTH TWICE A DAY  . clobetasol cream (TEMOVATE) 0.05 % Apply 1 application topically See admin instructions. Apply daily to affected areas of genital area as directed  . clonazePAM (KLONOPIN) 1 MG tablet Take 1/2-1 tab, by mouth twice a day (Patient taking differently: Take 1 mg by mouth 2 (two) times daily. )  . doxycycline (DORYX) 100 MG EC tablet Take 100 mg by mouth 2 (two) times daily.  [name] ipratropium-albuterol (DUONEB) 0.5-2.5 (3) MG/3ML SOLN Take 3 mLs by nebulization 2 (two) times daily as needed (For wheezing or shoortness of breath).  [name] levothyroxine (SYNTHROID, LEVOTHROID) 125 MCG tablet Take 1 tablet (125 mcg  total) by mouth daily. (Patient taking differently: Take 125 mcg by mouth at bedtime. )  . metoprolol succinate (TOPROL-XL) 25 MG 24 hr tablet Take 1 tablet (25 mg total) by mouth at bedtime.  [name] spironolactone (ALDACTONE) 25 MG tablet Take 1 tablet (25 mg total) by mouth daily.  [name] torsemide (DEMADEX) 20 MG tablet Take 2 tablets (40 mg total) by mouth daily.  . valsartan (DIOVAN) 80 MG tablet Take 1 tablet (80 mg total) by mouth daily.  [name] vortioxetine HBr (TRINTELLIX) 10 MG TABS tablet Take 1 tablet (10 mg total) by mouth daily.   No facility-administered encounter medications on file as of 06/10/2018.     Allergies as of 06/10/2018 - Review Complete 06/10/2018  Allergen Reaction Noted  . Lisinopril Swelling and Other (See Comments) 01/28/2014    Past Medical History:  Diagnosis Date  . Anxiety   . Asthma   . Back pain   . Chronic combined systolic and diastolic CHF (congestive heart failure) (HCC) 07/2011   a. Prior Echo: EF was 25-30%; b. No Sig CAD on CATH; c. No significant finding on Cardiac MRI; d. follow-up Echo 2014 --  EF of 50%;  e. 03/2014 Echo: EF 40-45%, gr 1 DD; f. 11/2014 Echo: EF 40-45%, Gr 2 DD.  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  . Depression   . Dyspnea   . Essential hypertension   . Family history of breast cancer   .  Family history of colon cancer   . Fibromyalgia   . Gallbladder problem   . Hypothyroidism   . IBS (irritable bowel syndrome)   . Morbid obesity (HCC)   . Nonischemic cardiomyopathy (HCC)    a. unclear etiology;  b. 01/2012 Cardiac MRI: no infiltrative pathology, EF 52%;  b. 11/2014 Echo: EF 40-45%, gr 2 DD.  [name] Scoliosis   . Sleep apnea   . Stomach problems   . Thyroid disease   . Vitamin B 12 deficiency   . Vitamin D deficiency     Past Surgical History:  Procedure Laterality Date    . CARDIAC CATHETERIZATION  08/2011   Normal Coronaries - LVEDP 32 mmHg  . Cardiac MRI  April 2013   No suggestion of a congenital abnormality; EF ~ 52% - no regional wall motion abnormalities., no increased myocardial signal intensity. No perfusion abnormality.  . CHOLECYSTECTOMY  2013  . TRANSTHORACIC ECHOCARDIOGRAM  07/2011   Moderate concentric LVH, mildly dilated. EF 25-30% no diastolic dysfunction parameters to suggest elevated LAP  . 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 Normal wall thickness (compared to prior reading of possible apical LVH), grade 2 DD -- with elevated LV filling pressures (however left atrium was read as normal size), normal artery function and size. No no suggestion of pulmonary hypertension    Family History  Problem Relation Age of Onset  . Depression Mother   . Alcoholism Mother   . Hypertension Father   . Heart disease Father   . Colon cancer Maternal Grandmother 28  . Heart attack Maternal Grandmother   . Heart disease Maternal Grandfather   . Heart attack Maternal Grandfather   . Heart disease Paternal Grandmother   . Breast cancer Paternal Grandmother 32       possible bilateral cancer  . Asthma Paternal Grandmother   . Heart attack Paternal Grandmother   . Heart attack Paternal Grandfather   . Breast cancer Maternal Aunt 50  . Breast cancer Paternal Aunt 60  . Stroke Neg Hx   . Thyroid disease Neg Hx     Social History   Socioeconomic History  . Marital status: Married    Spouse name: [name] Rieger  . Number of children: 0  . Years of education: Not on file  . Highest education level: Not on file  Occupational History  . Occupation: Programmer, multimedia  Social Needs  . Financial resource strain: Not on file  . Food insecurity:    Worry: Not on file    Inability: Not on file  . Transportation needs:     Medical: Not on file    Non-medical: Not on file  Tobacco Use  . Smoking status: Never Smoker  . Smokeless tobacco: Never Used  Substance and Sexual Activity  . Alcohol use: No  . Drug use: No  . Sexual activity: Not on file  Lifestyle  . Physical activity:    Days per week: Not on file    Minutes per session: Not on file  . Stress: Not on file  Relationships  . Social connections:    Talks on phone: Not on file    Gets together: Not on file    Attends religious service: Not on file    Active member of club or organization: Not on file    Attends meetings of clubs or organizations: Not on  file    Relationship status: Not on file  . Intimate partner violence:    Fear of current or ex partner: Not on file    Emotionally abused: Not on file    Physically abused: Not on file    Forced sexual activity: Not on file  Other Topics Concern  . Not on file  Social History Narrative  . Not on file    Review of Systems  Constitutional: Positive for fatigue. Negative for activity change.  HENT: Negative.   Eyes: Negative.   Respiratory: Positive for shortness of breath. Negative for wheezing.   Cardiovascular: Positive for leg swelling. Negative for chest pain and palpitations.  Gastrointestinal: Negative.   Endocrine: Negative.   Genitourinary: Negative.     Vitals:   06/10/18 1435  BP: 110/74  Pulse: 85  SpO2: 91%     Physical Exam  Constitutional: She is oriented to person, place, and time. She appears well-developed and well-nourished.  obese  HENT:  Head: Normocephalic and atraumatic.  Eyes: Pupils are equal, round, and reactive to light. Conjunctivae and EOM are normal. Right eye exhibits no discharge. Left eye exhibits no discharge.  Neck: Normal range of motion. Neck supple. No tracheal deviation present. No thyromegaly present.  Cardiovascular: Normal rate, regular rhythm and normal heart sounds.  Pulmonary/Chest: Effort normal. No respiratory distress. She has  no wheezes. She has no rales. She exhibits no tenderness.  Poor air movement bilaterally  Abdominal: Bowel sounds are normal.  Neurological: She is alert and oriented to person, place, and time.  Skin: Skin is warm and dry.   Data Reviewed: Echocardiogram results noted PFT from 2016 results noted  Assessment:  Hypoxemia -Desaturations with activity  Shortness of breath -Multifactorial related to restrictive lung disease, obesity  Morbid obesity -Multifactorial, history of thyroid dysfunction-being followed  Obstructive sleep apnea -Trying to get used to CPAP use -Encouraged to continue working on getting used to CPAP  Plan/Recommendations:  Continue to use BiPAP on a regular basis  Oxygen can be adjusted from 2 to 4 L with activity -If still persistently desaturating despite 4 L down she may need continuous flow oxygen -Encouraged to check frequently with pulse oximeter  Optimize weight loss efforts -Evaluation by bariatric surgery should be considered  Continue Symbicort and rescue inhaler use as needed  Encouraged to continue rehab-she was able to lose over 30 pounds last time she went to rehab  I will see back in the office in about 6 weeks I did encourage patient to call if despite increasing oxygen supplementation, she continues to desaturate with activity   Virl Diamond MD [facility] 06/10/2018, 3:02 PM  CC: Myrlene Broker, *

## 2018-06-10 NOTE — Patient Instructions (Addendum)
Shortness of breath is multifactorial Oxygen requirement secondary to multiple factors as well  You may titrate the oxygen use, increased to 4 L as needed, monitor with use of a pulse ox If you continuously require greater than 4 L to keep your oxygen away needs to be then a continuous flow may be in your best interest  Optimizing thyroid functions Further discuss other interventions that will help with weight loss with your primary doctor-bariatric surgery  I will see you back in the office in about 6 weeks Call with significant concerns Call if we need to change you to a continuous flow oxygen  We will repeat your breathing study to compare with previous-to try and explain whether this may have any bearing on you now requiring oxygen

## 2018-06-11 ENCOUNTER — Encounter (HOSPITAL_COMMUNITY)
Admission: RE | Admit: 2018-06-11 | Discharge: 2018-06-11 | Disposition: A | Discharge status: Home / Self Care | Payer: Commercial Managed Care - PPO | Source: Ambulatory Visit | Attending: Cardiology | Admitting: Cardiology

## 2018-06-11 DIAGNOSIS — I5042 Chronic combined systolic (congestive) and diastolic (congestive) heart failure: Secondary | ICD-10-CM

## 2018-06-12 NOTE — Progress Notes (Signed)
Pulmonary Individual Treatment Plan  Patient Details  Name: Meredith Diaz MRN: [id number] Date of Birth: 1981-02-06 Referring Provider:     CARDIAC REHAB PHASE II ORIENTATION from 06/03/2018 in [facility] CARDIAC [facility]  Referring Provider  Dr. Shirlee Latch      Initial Encounter Date:    CARDIAC REHAB PHASE II ORIENTATION from 06/03/2018 in [facility] CARDIAC REHAB  Date  06/03/18      Visit Diagnosis: 05/2018 Chronic combined systolic and diastolic CHF (congestive heart failure) (HCC)  Patient's Home Medications on Admission:   Current Outpatient Medications:  .  albuterol (VENTOLIN HFA) 108 (90 Base) MCG/ACT inhaler, INHALE 1-2 PUFFS INTO THE LUNGS EVERY 6 (SIX) HOURS AS NEEDED FOR WHEEZING OR SHORTNESS OF BREATH., Disp: 18 g, Rfl: 5 .  budesonide-formoterol (SYMBICORT) 160-4.5 MCG/ACT inhaler, TAKE 2 PUFFS BY MOUTH TWICE A DAY, Disp: 10.2 Inhaler, Rfl: 6 .  clobetasol cream (TEMOVATE) 0.05 %, Apply 1 application topically See admin instructions. Apply daily to affected areas of genital area as directed, Disp: , Rfl:  .  clonazePAM (KLONOPIN) 1 MG tablet, Take 1/2-1 tab, by mouth twice a day (Patient taking differently: Take 1 mg by mouth 2 (two) times daily. ), Disp: 60 tablet, Rfl: 5 .  doxycycline (DORYX) 100 MG EC tablet, Take 100 mg by mouth 2 (two) times daily., Disp: , Rfl:  .  ipratropium-albuterol (DUONEB) 0.5-2.5 (3) MG/3ML SOLN, Take 3 mLs by nebulization 2 (two) times daily as needed (For wheezing or shoortness of breath)., Disp: 180 mL, Rfl: 5 .  levothyroxine (SYNTHROID, LEVOTHROID) 125 MCG tablet, Take 1 tablet (125 mcg total) by mouth daily. (Patient taking differently: Take 125 mcg by mouth at bedtime. ), Disp: 90 tablet, Rfl: 2 .  metoprolol succinate (TOPROL-XL) 25 MG 24 hr tablet, Take 1 tablet (25 mg total) by mouth at bedtime., Disp: 30 tablet, Rfl: 6 .  spironolactone (ALDACTONE) 25 MG tablet, Take 1 tablet (25 mg total) by mouth  daily., Disp: 30 tablet, Rfl: 6 .  torsemide (DEMADEX) 20 MG tablet, Take 2 tablets (40 mg total) by mouth daily., Disp: 60 tablet, Rfl: 11 .  valsartan (DIOVAN) 80 MG tablet, Take 1 tablet (80 mg total) by mouth daily., Disp: 60 tablet, Rfl: 6 .  vortioxetine HBr (TRINTELLIX) 10 MG TABS tablet, Take 1 tablet (10 mg total) by mouth daily., Disp: 30 tablet, Rfl: 2  Past Medical History: Past Medical History:  Diagnosis Date  . Anxiety   . Asthma   . Back pain   . Chronic combined systolic and diastolic CHF (congestive heart failure) (HCC) 07/2011   a. Prior Echo: EF was 25-30%; b. No Sig CAD on CATH; c. No significant finding on Cardiac MRI; d. follow-up Echo 2014 --  EF of 50%;  e. 03/2014 Echo: EF 40-45%, gr 1 DD; f. 11/2014 Echo: EF 40-45%, Gr 2 DD.  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  . Depression   . Dyspnea   . Essential hypertension   . Family history of breast cancer   . Family history of colon cancer   . Fibromyalgia   . Gallbladder problem   . Hypothyroidism   . IBS (irritable bowel syndrome)   . Morbid obesity (HCC)   . Nonischemic cardiomyopathy (HCC)    a. unclear etiology;  b.  01/2012 Cardiac MRI: no infiltrative pathology, EF 52%;  b. 11/2014 Echo: EF 40-45%, gr 2 DD.  [name] Scoliosis   . Sleep apnea   . Stomach problems   . Thyroid disease   . Vitamin B 12 deficiency   . Vitamin D deficiency     Tobacco Use: Social History   Tobacco Use  Smoking Status Never Smoker  Smokeless Tobacco Never Used    Labs: Recent Review Flowsheet Data    Labs for ITP Cardiac and Pulmonary Rehab Latest Ref Rng & Units 01/28/2014 11/01/2014 06/15/2015 08/07/2017 04/16/2018   Cholestrol 100 - 199 mg/dL 161 - - 096 -   LDLCALC 0 - 99 mg/dL 045(W) - - 098(J) -   HDL >39 mg/dL 19.14 - - 49 -   Trlycerides 0 - 149 mg/dL 78.2 - - 956  -   Hemoglobin A1c 4.6 - 6.5 % - 5.7 5.6 5.9(H) 6.4      Capillary Blood Glucose: Lab Results  Component Value Date   GLUCAP 155 (H) 01/27/2015     Pulmonary Assessment Scores:   Pulmonary Function Assessment:   Exercise Target Goals: Exercise Program Goal: Individual exercise prescription set using results from initial 6 min walk test and THRR while considering  patient's activity barriers and safety.   Exercise Prescription Goal: Initial exercise prescription builds to 30-45 minutes a day of aerobic activity, 2-3 days per week.  Home exercise guidelines will be given to patient during program as part of exercise prescription that the participant will acknowledge.  Activity Barriers & Risk Stratification: Activity Barriers & Cardiac Risk Stratification - 06/03/18 1154      Activity Barriers & Cardiac Risk Stratification   Activity Barriers  Joint Problems;Deconditioning;Muscular Weakness;Shortness of Breath;Other (comment)    Comments  O2 use. Hip pain.     Cardiac Risk Stratification  High       6 Minute Walk: 6 Minute Walk    Row Name 06/03/18 1152         6 Minute Walk   Phase  Initial     Distance  600 feet     Walk Time  6 minutes     # of Rest Breaks  1     MPH  1.1     METS  2.44     RPE  17     Perceived Dyspnea   3     VO2 Peak  8.55     Symptoms  Yes (comment)     Comments  SOB     Resting HR  84 bpm     Resting BP  104/60     Resting Oxygen Saturation   91 %     Exercise Oxygen Saturation  during 6 min walk  85 %     Max Ex. HR  116 bpm     Max Ex. BP  104/60     2 Minute Post BP  108/64        Oxygen Initial Assessment:   Oxygen Re-Evaluation:   Oxygen Discharge (Final Oxygen Re-Evaluation):   Initial Exercise Prescription: Initial Exercise Prescription - 06/03/18 1200      Date of Initial Exercise RX and Referring Provider   Date  06/03/18    Referring Provider  Dr. Shirlee Latch    Expected Discharge Date  09/12/18      Oxygen    Oxygen  Continuous      NuStep   Level  2    SPM  75  Minutes  20    METs  2      Track   Laps  8    Minutes  10    METs  2.38      Prescription Details   Frequency (times per week)  3    Duration  Progress to 30 minutes of continuous aerobic without signs/symptoms of physical distress      Intensity   THRR 40-80% of Max Heartrate  73-146    Ratings of Perceived Exertion  11-13    Perceived Dyspnea  0-4      Progression   Progression  Continue to progress workloads to maintain intensity without signs/symptoms of physical distress.      Resistance Training   Training Prescription  Yes    Weight  2 lbs.     Reps  10-15       Perform Capillary Blood Glucose checks as needed.  Exercise Prescription Changes: Exercise Prescription Changes    Row Name 06/09/18 0900             Response to Exercise   Blood Pressure (Admit)  120/80       Blood Pressure (Exercise)  124/70       Blood Pressure (Exit)  112/60       Heart Rate (Admit)  94 bpm       Heart Rate (Exercise)  119 bpm       Heart Rate (Exit)  85 bpm       Oxygen Saturation (Admit)  92 %       Oxygen Saturation (Exercise)  82 %       Oxygen Saturation (Exit)  89 %       Rating of Perceived Exertion (Exercise)  14       Perceived Dyspnea (Exercise)  2       Symptoms  None       Comments  Pt oriented to exercise equipment        Duration  Progress to 30 minutes of  aerobic without signs/symptoms of physical distress       Intensity  THRR unchanged         Progression   Progression  Continue to progress workloads to maintain intensity without signs/symptoms of physical distress.       Average METs  2.2         Resistance Training   Training Prescription  No         Oxygen   Oxygen  Continuous         NuStep   Level  2       SPM  75       Minutes  20       METs  2         Track   Laps  4       Minutes  10       METs  1          Exercise Comments: Exercise Comments    Row Name 06/09/18  0912           Exercise Comments  Pt's first day of exercise. Pt was oriented to exercise equipment. Pt responded well to workloads. Will continue to monitor and encourage pt.           Exercise Goals and Review: Exercise Goals    Row Name 06/03/18 1155             Exercise Goals   Increase Physical  Activity  Yes       Intervention  Provide advice, education, support and counseling about physical activity/exercise needs.;Develop an individualized exercise prescription for aerobic and resistive training based on initial evaluation findings, risk stratification, comorbidities and participant's personal goals.       Expected Outcomes  Short Term: Attend rehab on a regular basis to increase amount of physical activity.       Increase Strength and Stamina  Yes       Intervention  Provide advice, education, support and counseling about physical activity/exercise needs.;Develop an individualized exercise prescription for aerobic and resistive training based on initial evaluation findings, risk stratification, comorbidities and participant's personal goals.       Expected Outcomes  Short Term: Increase workloads from initial exercise prescription for resistance, speed, and METs.       Able to understand and use rate of perceived exertion (RPE) scale  Yes       Intervention  Provide education and explanation on how to use RPE scale       Expected Outcomes  Short Term: Able to use RPE daily in rehab to express subjective intensity level;Long Term:  Able to use RPE to guide intensity level when exercising independently       Able to understand and use Dyspnea scale  Yes       Intervention  Provide education and explanation on how to use Dyspnea scale       Expected Outcomes  Short Term: Able to use Dyspnea scale daily in rehab to express subjective sense of shortness of breath during exertion;Long Term: Able to use Dyspnea scale to guide intensity level when exercising independently        Knowledge and understanding of Target Heart Rate Range (THRR)  Yes       Intervention  Provide education and explanation of THRR including how the numbers were predicted and where they are located for reference       Expected Outcomes  Short Term: Able to state/look up THRR;Long Term: Able to use THRR to govern intensity when exercising independently;Short Term: Able to use daily as guideline for intensity in rehab       Able to check pulse independently  Yes       Intervention  Provide education and demonstration on how to check pulse in carotid and radial arteries.;Review the importance of being able to check your own pulse for safety during independent exercise       Expected Outcomes  Short Term: Able to explain why pulse checking is important during independent exercise;Long Term: Able to check pulse independently and accurately       Understanding of Exercise Prescription  Yes       Intervention  Provide education, explanation, and written materials on patient's individual exercise prescription       Expected Outcomes  Short Term: Able to explain program exercise prescription;Long Term: Able to explain home exercise prescription to exercise independently          Exercise Goals Re-Evaluation :   Discharge Exercise Prescription (Final Exercise Prescription Changes): Exercise Prescription Changes - 06/09/18 0900      Response to Exercise   Blood Pressure (Admit)  120/80    Blood Pressure (Exercise)  124/70    Blood Pressure (Exit)  112/60    Heart Rate (Admit)  94 bpm    Heart Rate (Exercise)  119 bpm    Heart Rate (Exit)  85 bpm    Oxygen Saturation (Admit)  92 %    Oxygen Saturation (Exercise)  82 %    Oxygen Saturation (Exit)  89 %    Rating of Perceived Exertion (Exercise)  14    Perceived Dyspnea (Exercise)  2    Symptoms  None    Comments  Pt oriented to exercise equipment     Duration  Progress to 30 minutes of  aerobic without signs/symptoms of physical distress     Intensity  THRR unchanged      Progression   Progression  Continue to progress workloads to maintain intensity without signs/symptoms of physical distress.    Average METs  2.2      Resistance Training   Training Prescription  No      Oxygen   Oxygen  Continuous      NuStep   Level  2    SPM  75    Minutes  20    METs  2      Track   Laps  4    Minutes  10    METs  1       Nutrition:  Target Goals: Understanding of nutrition guidelines, daily intake of sodium 1500mg , cholesterol 200mg , calories 30% from fat and 7% or less from saturated fats, daily to have 5 or more servings of fruits and vegetables.  Biometrics: Pre Biometrics - 06/03/18 1155      Pre Biometrics   Height  5' (1.524 m)    Weight  102.1 kg    Waist Circumference  47 inches    Hip Circumference  53.5 inches    Waist to Hip Ratio  0.88 %    BMI (Calculated)  43.96    Triceps Skinfold  25 mm    % Body Fat  50.5 %    Grip Strength  24 kg    Flexibility  0 in    Single Leg Stand  22.06 seconds        Nutrition Therapy Plan and Nutrition Goals: Nutrition Therapy & Goals - 06/03/18 1114      Nutrition Therapy   Diet  heart healthy, carb modified      Personal Nutrition Goals   Nutrition Goal  Pt to identify and limit food sources of saturated fat, trans fat, refined carbohydrates and sodium    Personal Goal #2  Pt to build a healthy plate including vegetables, fruits, whole grains, and low-fat dairy products in a heart healthy meal plan    Personal Goal #3  Pt able to name foods that affect blood glucose    Personal Goal #4  Pt to identify food quantities necessary to achieve weight loss of 6-24 lb at graduation from cardiac rehab.       Intervention Plan   Intervention  Prescribe, educate and counsel regarding individualized specific dietary modifications aiming towards targeted core components such as weight, hypertension, lipid management, diabetes, heart failure and other comorbidities.     Expected Outcomes  Short Term Goal: Understand basic principles of dietary content, such as calories, fat, sodium, cholesterol and nutrients.;Long Term Goal: Adherence to prescribed nutrition plan.       Nutrition Assessments: Nutrition Assessments - 06/03/18 1115      MEDFICTS Scores   Pre Score  21       Nutrition Goals Re-Evaluation:   Nutrition Goals Discharge (Final Nutrition Goals Re-Evaluation):   Psychosocial: Target Goals: Acknowledge presence or absence of significant depression and/or stress, maximize coping skills, provide positive support system. Participant is able  to verbalize types and ability to use techniques and skills needed for reducing stress and depression.  Initial Review & Psychosocial Screening: Initial Psych Review & Screening - 06/03/18 1126      Initial Review   Current issues with  Current Stress Concerns;History of Depression;Current Anxiety/Panic;Current Depression    Source of Stress Concerns  Chronic Illness;Unable to participate in former interests or hobbies;Unable to perform yard/household activities    Comments  declining health, self disappointment, feelings of failure and hopelessness      Family Dynamics   Good Support System?  Yes   husband, friends, therapist      Barriers   Psychosocial barriers to participate in program  The patient should benefit from training in stress management and relaxation.;Psychosocial barriers identified (see note)      Screening Interventions   Interventions  Encouraged to exercise;Provide feedback about the scores to participant    Expected Outcomes  Short Term goal: Utilizing psychosocial counselor, staff and physician to assist with identification of specific Stressors or current issues interfering with healing process. Setting desired goal for each stressor or current issue identified.;Long Term Goal: Stressors or current issues are controlled or eliminated.;Short Term goal: Identification and review  with participant of any Quality of Life or Depression concerns found by scoring the questionnaire.;Long Term goal: The participant improves quality of Life and PHQ9 Scores as seen by post scores and/or verbalization of changes       Quality of Life Scores: Quality of Life - 06/03/18 1132      Quality of Life Scores   Health/Function Pre  8.97 %    Socioeconomic Pre  22 %    Psych/Spiritual Pre  11.5 %    Family Pre  21.4 %    GLOBAL Pre  14.24 %      Scores of 19 and below usually indicate a poorer quality of life in these areas.  A difference of  2-3 points is a clinically meaningful difference.  A difference of 2-3 points in the total score of the Quality of Life Index has been associated with significant improvement in overall quality of life, self-image, physical symptoms, and general health in studies assessing change in quality of life.  PHQ-9: Recent Review Flowsheet Data    Depression screen [facility] 2/9 06/09/2018 04/16/2018 08/07/2017   Decreased Interest 3 3 2    Down, Depressed, Hopeless 3 3 3    PHQ - 2 Score 6 6 5    Altered sleeping 2 2 2    Tired, decreased energy 3 3 3    Change in appetite 2 2 3    Feeling bad or failure about yourself  3 3 3    Trouble concentrating 2 2 2    Moving slowly or fidgety/restless 2 2 2    Suicidal thoughts 0 0 0   PHQ-9 Score 20 20 20    Difficult doing work/chores Very difficult - Very difficult     Interpretation of Total Score  Total Score Depression Severity:  1-4 = Minimal depression, 5-9 = Mild depression, 10-14 = Moderate depression, 15-19 = Moderately severe depression, 20-27 = Severe depression   Psychosocial Evaluation and Intervention: Psychosocial Evaluation - 06/09/18 0841      Psychosocial Evaluation & Interventions   Interventions  Therapist referral;Stress management education;Encouraged to exercise with the program and follow exercise prescription;Relaxation education    Comments  Aalani has a negative outlook.  She feels as if  she is a failure for returning to cardiac rehab.  Davisha is not hopeful for  any improvements in her health with participation in cardiac rehab. Naziya is currently seeing a therapist.  Jacueline enjoys watching TV and spending time with her dogs.     Expected Outcomes  Callieann will continue to utilize her resources to help with her outlook.  Emotional support given as needed.     Continue Psychosocial Services   Follow up required by counselor       Psychosocial Re-Evaluation: Psychosocial Re-Evaluation    Row Name 06/12/18 1447             Psychosocial Re-Evaluation   Current issues with  Current Depression;Current Anxiety/Panic;Current Stress Concerns       Comments  Eliora has a negative outlook.  She feels as if she is a failure for returning to cardiac rehab.  Jillienne is not hopeful for any improvements in her health with participation in cardiac rehab. Darlina is currently seeing a therapist.        Expected Outcomes  Elaura will continue to utilize her resources to help with her outlook.  Emotional support given as needed.        Interventions  Therapist referral;Stress management education;Relaxation education;Encouraged to attend Cardiac Rehabilitation for the exercise       Continue Psychosocial Services   Follow up required by counselor          Psychosocial Discharge (Final Psychosocial Re-Evaluation): Psychosocial Re-Evaluation - 06/12/18 1447      Psychosocial Re-Evaluation   Current issues with  Current Depression;Current Anxiety/Panic;Current Stress Concerns    Comments  Ressa has a negative outlook.  She feels as if she is a failure for returning to cardiac rehab.  Maari is not hopeful for any improvements in her health with participation in cardiac rehab. Nivedita is currently seeing a therapist.     Expected Outcomes  Imane will continue to utilize her resources to help with her outlook.  Emotional support given as needed.     Interventions  Therapist referral;Stress management  education;Relaxation education;Encouraged to attend Cardiac Rehabilitation for the exercise    Continue Psychosocial Services   Follow up required by counselor       Education: Education Goals: Education classes will be provided on a weekly basis, covering required topics. Participant will state understanding/return demonstration of topics presented.  Learning Barriers/Preferences: Learning Barriers/Preferences - 06/03/18 1156      Learning Barriers/Preferences   Learning Barriers  Sight;Exercise Concerns   Dizziness   Learning Preferences  Written Material;Skilled Demonstration       Education Topics: Risk Factor Reduction:  -Group instruction that is supported by a PowerPoint presentation. Instructor discusses the definition of a risk factor, different risk factors for pulmonary disease, and how the heart and lungs work together.     Nutrition for Pulmonary Patient:  -Group instruction provided by PowerPoint slides, verbal discussion, and written materials to support subject matter. The instructor gives an explanation and review of healthy diet recommendations, which includes a discussion on weight management, recommendations for fruit and vegetable consumption, as well as protein, fluid, caffeine, fiber, sodium, sugar, and alcohol. Tips for eating when patients are short of breath are discussed.   Pursed Lip Breathing:  -Group instruction that is supported by demonstration and informational handouts. Instructor discusses the benefits of pursed lip and diaphragmatic breathing and detailed demonstration on how to preform both.     Oxygen Safety:  -Group instruction provided by PowerPoint, verbal discussion, and written material to support subject matter. There is an overview of "What is Oxygen"  and "Why do we need it".  Instructor also reviews how to create a safe environment for oxygen use, the importance of using oxygen as prescribed, and the risks of noncompliance. There is a  brief discussion on traveling with oxygen and resources the patient may utilize.   Oxygen Equipment:  -Group instruction provided by [facility] Staff utilizing handouts, written materials, and equipment demonstrations.   Signs and Symptoms:  -Group instruction provided by written material and verbal discussion to support subject matter. Warning signs and symptoms of infection, stroke, and heart attack are reviewed and when to call the physician/911 reinforced. Tips for preventing the spread of infection discussed.   Advanced Directives:  -Group instruction provided by verbal instruction and written material to support subject matter. Instructor reviews Advanced Directive laws and proper instruction for filling out document.   Pulmonary Video:  -Group video education that reviews the importance of medication and oxygen compliance, exercise, good nutrition, pulmonary hygiene, and pursed lip and diaphragmatic breathing for the pulmonary patient.   Exercise for the Pulmonary Patient:  -Group instruction that is supported by a PowerPoint presentation. Instructor discusses benefits of exercise, core components of exercise, frequency, duration, and intensity of an exercise routine, importance of utilizing pulse oximetry during exercise, safety while exercising, and options of places to exercise outside of rehab.     Pulmonary Medications:  -Verbally interactive group education provided by instructor with focus on inhaled medications and proper administration.   Anatomy and Physiology of the Respiratory System and Intimacy:  -Group instruction provided by PowerPoint, verbal discussion, and written material to support subject matter. Instructor reviews respiratory cycle and anatomical components of the respiratory system and their functions. Instructor also reviews differences in obstructive and restrictive respiratory diseases with examples of each. Intimacy, Sex, and Sexuality differences are  reviewed with a discussion on how relationships can change when diagnosed with pulmonary disease. Common sexual concerns are reviewed.   MD DAY -A group question and answer session with a medical doctor that allows participants to ask questions that relate to their pulmonary disease state.   OTHER EDUCATION -Group or individual verbal, written, or video instructions that support the educational goals of the pulmonary rehab program.   Holiday Eating Survival Tips:  -Group instruction provided by PowerPoint slides, verbal discussion, and written materials to support subject matter. The instructor gives patients tips, tricks, and techniques to help them not only survive but enjoy the holidays despite the onslaught of food that accompanies the holidays.   Knowledge Questionnaire Score: Knowledge Questionnaire Score - 06/03/18 1134      Knowledge Questionnaire Score   Pre Score  23/24       Core Components/Risk Factors/Patient Goals at Admission: Personal Goals and Risk Factors at Admission - 06/03/18 1157      Core Components/Risk Factors/Patient Goals on Admission    Weight Management  Yes;Obesity;Weight Maintenance    Intervention  Weight Management: Develop a combined nutrition and exercise program designed to reach desired caloric intake, while maintaining appropriate intake of nutrient and fiber, sodium and fats, and appropriate energy expenditure required for the weight goal.;Weight Management: Provide education and appropriate resources to help participant work on and attain dietary goals.;Weight Management/Obesity: Establish reasonable short term and long term weight goals.;Obesity: Provide education and appropriate resources to help participant work on and attain dietary goals.    Admit Weight  225 lb 1.4 oz (102.1 kg)    Expected Outcomes  Short Term: Continue to assess and modify interventions until short  term weight is achieved;Long Term: Adherence to nutrition and physical  activity/exercise program aimed toward attainment of established weight goal;Weight Maintenance: Understanding of the daily nutrition guidelines, which includes 25-35% calories from fat, 7% or less cal from saturated fats, less than 200mg  cholesterol, less than 1.5gm of sodium, & 5 or more servings of fruits and vegetables daily;Understanding recommendations for meals to include 15-35% energy as protein, 25-35% energy from fat, 35-60% energy from carbohydrates, less than 200mg  of dietary cholesterol, 20-35 gm of total fiber daily;Understanding of distribution of calorie intake throughout the day with the consumption of 4-5 meals/snacks;Weight Loss: Understanding of general recommendations for a balanced deficit meal plan, which promotes 1-2 lb weight loss per week and includes a negative energy balance of [phone] kcal/d    Improve shortness of breath with ADL's  Yes    Intervention  Provide education, individualized exercise plan and daily activity instruction to help decrease symptoms of SOB with activities of daily living.    Expected Outcomes  Short Term: Improve cardiorespiratory fitness to achieve a reduction of symptoms when performing ADLs;Long Term: Be able to perform more ADLs without symptoms or delay the onset of symptoms    Heart Failure  Yes    Intervention  Provide a combined exercise and nutrition program that is supplemented with education, support and counseling about heart failure. Directed toward relieving symptoms such as shortness of breath, decreased exercise tolerance, and extremity edema.    Expected Outcomes  Improve functional capacity of life    Hypertension  Yes    Intervention  Provide education on lifestyle modifcations including regular physical activity/exercise, weight management, moderate sodium restriction and increased consumption of fresh fruit, vegetables, and low fat dairy, alcohol moderation, and smoking cessation.;Monitor prescription use compliance.    Expected  Outcomes  Short Term: Continued assessment and intervention until BP is < 140/55mm HG in hypertensive participants. < 130/61mm HG in hypertensive participants with diabetes, heart failure or chronic kidney disease.;Long Term: Maintenance of blood pressure at goal levels.    Lipids  Yes    Intervention  Provide education and support for participant on nutrition & aerobic/resistive exercise along with prescribed medications to achieve LDL 70mg , HDL >40mg .    Expected Outcomes  Short Term: Participant states understanding of desired cholesterol values and is compliant with medications prescribed. Participant is following exercise prescription and nutrition guidelines.;Long Term: Cholesterol controlled with medications as prescribed, with individualized exercise RX and with personalized nutrition plan. Value goals: LDL < 70mg , HDL > 40 mg.    Stress  Yes    Intervention  Offer individual and/or small group education and counseling on adjustment to heart disease, stress management and health-related lifestyle change. Teach and support self-help strategies.;Refer participants experiencing significant psychosocial distress to appropriate mental health specialists for further evaluation and treatment. When possible, include family members and significant others in education/counseling sessions.    Expected Outcomes  Short Term: Participant demonstrates changes in health-related behavior, relaxation and other stress management skills, ability to obtain effective social support, and compliance with psychotropic medications if prescribed.;Long Term: Emotional wellbeing is indicated by absence of clinically significant psychosocial distress or social isolation.       Core Components/Risk Factors/Patient Goals Review:  Goals and Risk Factor Review    Row Name 06/09/18 0846             Core Components/Risk Factors/Patient Goals Review   Personal Goals Review  Weight Management/Obesity;Heart  Failure;Lipids;Stress;Hypertension;Improve shortness of breath with ADL's  Review  Pt with multiple CAD RFs participating in CR. Rennis Hardingllis would like to increase her activity level and feel comfortable with exercise.        Expected Outcomes  Pt will continue to participate in CR exercise, nutrition, and lifestyle modification opportunities.           Core Components/Risk Factors/Patient Goals at Discharge (Final Review):  Goals and Risk Factor Review - 06/09/18 0846      Core Components/Risk Factors/Patient Goals Review   Personal Goals Review  Weight Management/Obesity;Heart Failure;Lipids;Stress;Hypertension;Improve shortness of breath with ADL's    Review  Pt with multiple CAD RFs participating in CR. Rennis Hardingllis would like to increase her activity level and feel comfortable with exercise.     Expected Outcomes  Pt will continue to participate in CR exercise, nutrition, and lifestyle modification opportunities.        ITP Comments: ITP Comments    Row Name 06/03/18 1126 06/09/18 0834         ITP Comments  Dr. Armanda Magicraci Turner, Medical Director   30 Day ITP Review.  Pt started exercise today and tolerated it well.          Comments: See ITP Comments.

## 2018-06-13 ENCOUNTER — Encounter (HOSPITAL_COMMUNITY)
Admission: RE | Admit: 2018-06-13 | Discharge: 2018-06-13 | Disposition: A | Discharge status: Home / Self Care | Payer: Commercial Managed Care - PPO | Source: Ambulatory Visit | Attending: Cardiology | Admitting: Cardiology

## 2018-06-13 DIAGNOSIS — I5042 Chronic combined systolic (congestive) and diastolic (congestive) heart failure: Secondary | ICD-10-CM | POA: Diagnosis not present

## 2018-06-13 DIAGNOSIS — J449 Chronic obstructive pulmonary disease, unspecified: Secondary | ICD-10-CM | POA: Diagnosis not present

## 2018-06-16 ENCOUNTER — Encounter (HOSPITAL_COMMUNITY)
Admission: RE | Admit: 2018-06-16 | Discharge: 2018-06-16 | Disposition: A | Discharge status: Home / Self Care | Payer: Commercial Managed Care - PPO | Source: Ambulatory Visit | Attending: Cardiology | Admitting: Cardiology

## 2018-06-16 DIAGNOSIS — I5042 Chronic combined systolic (congestive) and diastolic (congestive) heart failure: Secondary | ICD-10-CM

## 2018-06-18 ENCOUNTER — Encounter (HOSPITAL_COMMUNITY)
Admission: RE | Admit: 2018-06-18 | Discharge: 2018-06-18 | Disposition: A | Discharge status: Home / Self Care | Payer: Commercial Managed Care - PPO | Source: Ambulatory Visit | Attending: Cardiology | Admitting: Cardiology

## 2018-06-18 DIAGNOSIS — I5042 Chronic combined systolic (congestive) and diastolic (congestive) heart failure: Secondary | ICD-10-CM

## 2018-06-20 ENCOUNTER — Encounter (HOSPITAL_COMMUNITY): Payer: Commercial Managed Care - PPO

## 2018-06-23 ENCOUNTER — Encounter (HOSPITAL_COMMUNITY)
Admission: RE | Admit: 2018-06-23 | Discharge: 2018-06-23 | Disposition: A | Discharge status: Home / Self Care | Payer: Commercial Managed Care - PPO | Source: Ambulatory Visit | Attending: Cardiology | Admitting: Cardiology

## 2018-06-23 DIAGNOSIS — I5042 Chronic combined systolic (congestive) and diastolic (congestive) heart failure: Secondary | ICD-10-CM

## 2018-06-25 ENCOUNTER — Encounter (HOSPITAL_COMMUNITY)
Admission: RE | Admit: 2018-06-25 | Discharge: 2018-06-25 | Disposition: A | Discharge status: Home / Self Care | Payer: Commercial Managed Care - PPO | Source: Ambulatory Visit | Attending: Cardiology | Admitting: Cardiology

## 2018-06-25 DIAGNOSIS — I5042 Chronic combined systolic (congestive) and diastolic (congestive) heart failure: Secondary | ICD-10-CM

## 2018-06-25 NOTE — Progress Notes (Signed)
Meredith Diaz 38 y.o. female Nutrition Note Spoke with pt. Nutrition plan and goals reviewed with pt. Pt is following heart healthy diet. Pt desires weight loss. Pt previous weight loss attempts through medical weight loss programs, nutritionists, and various popular diets. Feels past attempts were highly restrictive, and ultimately did not work for her. Last session pt reported a history of disordered eating and we discussed mindful/intuitive eating exercises, to help pt understand and learn her hunger/satiety cues. Pt was tearful today when discussing past dietary history, attempted to comfort patient. Reminded pt of the importance of being inclusive of all foods groups, and encouraged her to focusing on gradually decreasing portion sizes over time while starting to incorporate in physical activity to create a caloric deficit. Pt expressed understanding of the information reviewed. Pt aware of nutrition education classes offered and would like to attend nutrition classes.  Lab Results  Component Value Date   HGBA1C 6.4 04/16/2018    Wt Readings from Last 3 Encounters:  06/10/18 228 lb (103.4 kg)  06/03/18 225 lb 1.4 oz (102.1 kg)  04/16/18 222 lb (100.7 kg)   Nutrition Diagnosis ? Food-and nutrition-related knowledge deficit related to lack of exposure to information as related to diagnosis of: ? CVD   Nutrition Intervention ? Pt's individual nutrition plan and goals reviewed with pt. ? Pt given handouts for: ? Nutrition I class ? Nutrition II class  ? Intuitive/mindful eating   Nutrition Goal(s):  ? Pt to identify and limit food sources of saturated fat, trans fat, refined carbohydrates and sodium ? Pt to identify food quantities necessary to achieve weight loss of 6-24 lb at graduation from cardiac rehab.  ? Pt to build a healthy plate including vegetables, fruits, whole grains, and low-fat dairy products in a heart healthy meal plan. ? Pt able to name foods that affect blood  glucose  Plan:  ? Pt to attend nutrition classes:  ? Nutrition I ? Nutrition II ? Portion Distortion  ? Will provide client-centered nutrition education as part of interdisciplinary care ? Monitor and evaluate progress toward nutrition goal with team.   Ross Marcus, MS, RD, LDN 06/25/2018 8:43 AM

## 2018-06-27 ENCOUNTER — Encounter (HOSPITAL_COMMUNITY)
Admission: RE | Admit: 2018-06-27 | Discharge: 2018-06-27 | Disposition: A | Discharge status: Home / Self Care | Payer: Commercial Managed Care - PPO | Source: Ambulatory Visit | Attending: Cardiology | Admitting: Cardiology

## 2018-06-27 DIAGNOSIS — I5042 Chronic combined systolic (congestive) and diastolic (congestive) heart failure: Secondary | ICD-10-CM | POA: Diagnosis not present

## 2018-06-30 ENCOUNTER — Encounter (HOSPITAL_COMMUNITY)
Admission: RE | Admit: 2018-06-30 | Discharge: 2018-06-30 | Disposition: A | Discharge status: Home / Self Care | Payer: Commercial Managed Care - PPO | Source: Ambulatory Visit | Attending: Cardiology | Admitting: Cardiology

## 2018-06-30 DIAGNOSIS — I5042 Chronic combined systolic (congestive) and diastolic (congestive) heart failure: Secondary | ICD-10-CM

## 2018-07-02 ENCOUNTER — Encounter (HOSPITAL_COMMUNITY)
Admission: RE | Admit: 2018-07-02 | Discharge: 2018-07-02 | Disposition: A | Discharge status: Home / Self Care | Payer: Commercial Managed Care - PPO | Source: Ambulatory Visit | Attending: Cardiology | Admitting: Cardiology

## 2018-07-02 DIAGNOSIS — I5042 Chronic combined systolic (congestive) and diastolic (congestive) heart failure: Secondary | ICD-10-CM | POA: Diagnosis not present

## 2018-07-03 ENCOUNTER — Ambulatory Visit (HOSPITAL_COMMUNITY)
Admission: RE | Admit: 2018-07-03 | Discharge: 2018-07-03 | Disposition: A | Discharge status: Home / Self Care | Payer: Commercial Managed Care - PPO | Source: Ambulatory Visit | Attending: Cardiology | Admitting: Cardiology

## 2018-07-03 ENCOUNTER — Encounter (HOSPITAL_COMMUNITY): Payer: Self-pay | Admitting: Cardiology

## 2018-07-03 VITALS — BP 118/88 | HR 100 | Wt 223.4 lb

## 2018-07-03 DIAGNOSIS — I428 Other cardiomyopathies: Secondary | ICD-10-CM | POA: Insufficient documentation

## 2018-07-03 DIAGNOSIS — J45909 Unspecified asthma, uncomplicated: Secondary | ICD-10-CM | POA: Insufficient documentation

## 2018-07-03 DIAGNOSIS — Z8249 Family history of ischemic heart disease and other diseases of the circulatory system: Secondary | ICD-10-CM | POA: Insufficient documentation

## 2018-07-03 DIAGNOSIS — E039 Hypothyroidism, unspecified: Secondary | ICD-10-CM | POA: Diagnosis not present

## 2018-07-03 DIAGNOSIS — I5042 Chronic combined systolic (congestive) and diastolic (congestive) heart failure: Secondary | ICD-10-CM

## 2018-07-03 DIAGNOSIS — I5022 Chronic systolic (congestive) heart failure: Secondary | ICD-10-CM | POA: Insufficient documentation

## 2018-07-03 DIAGNOSIS — G4733 Obstructive sleep apnea (adult) (pediatric): Secondary | ICD-10-CM | POA: Diagnosis not present

## 2018-07-03 DIAGNOSIS — Z79890 Hormone replacement therapy (postmenopausal): Secondary | ICD-10-CM | POA: Diagnosis not present

## 2018-07-03 DIAGNOSIS — E669 Obesity, unspecified: Secondary | ICD-10-CM | POA: Insufficient documentation

## 2018-07-03 DIAGNOSIS — R0609 Other forms of dyspnea: Secondary | ICD-10-CM

## 2018-07-03 DIAGNOSIS — Z79899 Other long term (current) drug therapy: Secondary | ICD-10-CM | POA: Diagnosis not present

## 2018-07-03 DIAGNOSIS — F329 Major depressive disorder, single episode, unspecified: Secondary | ICD-10-CM | POA: Insufficient documentation

## 2018-07-03 LAB — BASIC METABOLIC PANEL
Anion gap: 10 (ref 5–15)
BUN: 16 mg/dL (ref 6–20)
CO2: 33 mmol/L — ABNORMAL HIGH (ref 22–32)
CREATININE: 0.8 mg/dL (ref 0.44–1.00)
Calcium: 8.7 mg/dL — ABNORMAL LOW (ref 8.9–10.3)
Chloride: 95 mmol/L — ABNORMAL LOW (ref 98–111)
GFR calc Af Amer: >60 mL/min (ref >60)
GFR calc non Af Amer: >60 mL/min (ref >60)
Glucose, Bld: 110 mg/dL — ABNORMAL HIGH (ref 70–99)
Potassium: 4.4 mmol/L (ref 3.5–5.1)
Sodium: 138 mmol/L (ref 135–145)

## 2018-07-03 LAB — BRAIN NATRIURETIC PEPTIDE: B Natriuretic Peptide: 46.9 pg/mL (ref 0.0–100.0)

## 2018-07-03 LAB — CBC
HCT: 42.7 % (ref 36.0–46.0)
Hemoglobin: 13.1 g/dL (ref 12.0–15.0)
MCH: 28.7 pg (ref 26.0–34.0)
MCHC: 30.7 g/dL (ref 30.0–36.0)
MCV: 93.6 fL (ref 80.0–100.0)
Platelets: 340 10*3/uL (ref 150–400)
RBC: 4.56 MIL/uL (ref 3.87–5.11)
RDW: 13.9 % (ref 11.5–15.5)
WBC: 11.7 10*3/uL — ABNORMAL HIGH (ref 4.0–10.5)
nRBC: 0 % (ref 0.0–0.2)

## 2018-07-03 LAB — PROTIME-INR
INR: 1.1 (ref 0.8–1.2)
Prothrombin Time: 13.7 seconds (ref 11.4–15.2)

## 2018-07-03 NOTE — Progress Notes (Signed)
ReDS Vest - 07/03/18 1200      ReDS Vest   MR   No  (Pended)     Estimated volume prior to reading  Med  (Pended)     Fitting Posture  Sitting  (Pended)     Height Marker  Short  (Pended)     Ruler Value  40  (Pended)     Center Strip  Aligned  (Pended)     ReDS Value  27  (Pended)

## 2018-07-03 NOTE — Progress Notes (Signed)
Patient ID: Meredith Diaz, female   DOB: 12/31/1980, 38 y.o.   MRN: [id number]     Advanced Heart Failure Clinic Note   PCP: Dr. Okey Dupre HF Cardiology: Dr. Hart Robinsons [name] is a 38 y.o. female with a hx of nonischemic cardiomyopathy.  This was diagnosed in 2013.  EF was initially 25-30%.  Cardiac cath at that time showed no significant CAD.  Cardiac MRI showed no late gadoliniuim enhancement.  Since that time, EF has remained low but has improved a bit.  Most recent echo in 11/17 showed EF 45-50% with diffuse hypokinesis.  She also has asthma.  PFTs showed a severe restrictive defect that may be due to body habitus primarily.   She was admitted in 8/16 with chest pain.  CTA chest showed no PE, lung parenchyma looked normal.  She had ETT with poor exercise tolerance but no evidence for ischemia.  CPX was done in 12/16, showing moderate functional limitation due primarily to lung restriction.   Admitted 11/4 - 03/13/18 for increased DOE. Diuresed well with IV lasix and treated for asthma exacerbation. Treated with solumedrol and prednisone burst. Echo with EF 50-55%, mildly dilated/dysfunctional RV, septal bounce noted but IVC normal in size.   She presents today for followup of dyspnea.  She is wearing home oxygen now and using CPAP at night. She has been struggling with dyspnea since 11/19 hospitalization.  She has orthopnea.  She is short of breath with most exertion and feels exhausted all the time.  Showering and dressing are very hard for her.    REDS clip measurement: 27%.  Labs (9/16): K 3.2, creatinine 1.11, hgb 14.3 Labs (12/16): K 3.3, creatinine 0.96, BNP 23 Labs (2/17): K 4.7, creatinine 0.95, TSH normal Labs (8/17): K 3.9, creatinine 0.93 Labs (12/18): K 3.6, creatinine 0.82, pro-BNP 42, TSH normal Labs (12/19): K 4.4, creatinine 0.9  ECG (personally reviewed, 11/19): NSR, nonspecific TWIs  PMH: 1. Chronic systolic CHF: Nonischemic cardiomyopathy.  - EF  25-30% by echo in 2013.  - Cardiac MRI (2013) with EF 52%, no delayed enhancement.  - LHC in 2013 without significant CAD. - 11/15 echo with EF 40-45%.   - 7/16 echo with EF 40-45%, grade II diastolic dysfunction.  - ETT (5/94) with poor exercise tolerance but no ischemia.  - CPX (12/16): peak VO2 15.7 (23.5 adjusted for ideal body weight), VE/VCO2 slope 21, RER 1.15 => moderate functional limitation thought to be primarily due to restrictive lung physiology.  - Echo (11/17): EF 45-50%, mild diffuse hypokinesis, normal RV size and systolic function.  - Echo (11/19): EF 50-55%, mildly dilated and mildly dysfunctional RV with respirophasic septal bounce noted, normal IVC and PA pressure estimate.  2. Asthma: PFTs (4/16) with mild obstruction, severe restriction and severely decreased DLCO => think primarily due to body habitus.  3. Depression 4. Hypothyroidism 5. 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  6. Angioedema with ACEI 7. OHS/OSA: CPAP at night, oxygen during the day.  8. Chronic leukocytosis: Negative workup.  9. BPPV 10. Obesity  SH: Lives in [place], no smoking, rare ETOH, works as Programmer, multimedia.   FH: Father with CAD, grandfather and grandmother with MIs.  No cardiomyopathy.   Review of systems complete and found to be negative unless listed in HPI.    Current Outpatient Medications  Medication Sig Dispense Refill  . albuterol (VENTOLIN HFA) 108 (90 Base) MCG/ACT inhaler INHALE 1-2 PUFFS INTO THE LUNGS EVERY 6 (SIX) HOURS AS NEEDED FOR WHEEZING  OR SHORTNESS OF BREATH. (Patient taking differently: Inhale 1-2 puffs into the lungs every 6 (six) hours as needed for wheezing or shortness of breath. INHALE 1-2 PUFFS INTO THE LUNGS EVERY 6 (SIX) HOURS AS NEEDED FOR WHEEZING OR SHORTNESS OF BREATH.) 18 g 5  . budesonide-formoterol (SYMBICORT) 160-4.5 MCG/ACT inhaler TAKE 2 PUFFS BY MOUTH TWICE A DAY (Patient taking differently: Inhale 2 puffs into the lungs 2 (two) times  daily. ) 10.2 Inhaler 6  . clobetasol cream (TEMOVATE) 0.05 % Apply 1 application topically See admin instructions. Apply daily to affected areas of genital area as directed    . clonazePAM (KLONOPIN) 1 MG tablet Take 1/2-1 tab, by mouth twice a day (Patient taking differently: Take 1 mg by mouth 2 (two) times daily. ) 60 tablet 5  . ipratropium-albuterol (DUONEB) 0.5-2.5 (3) MG/3ML SOLN Take 3 mLs by nebulization 2 (two) times daily as needed (For wheezing or shoortness of breath). (Patient taking differently: Take 3 mLs by nebulization 2 (two) times daily as needed (For wheezing or shortness of breath). ) 180 mL 5  . levothyroxine (SYNTHROID, LEVOTHROID) 125 MCG tablet Take 1 tablet (125 mcg total) by mouth daily. 90 tablet 2  . metoprolol succinate (TOPROL-XL) 25 MG 24 hr tablet Take 1 tablet (25 mg total) by mouth at bedtime. 30 tablet 6  . spironolactone (ALDACTONE) 25 MG tablet Take 1 tablet (25 mg total) by mouth daily. 30 tablet 6  . torsemide (DEMADEX) 20 MG tablet Take 2 tablets (40 mg total) by mouth daily. 60 tablet 11  . valsartan (DIOVAN) 80 MG tablet Take 1 tablet (80 mg total) by mouth daily. 60 tablet 6  . vortioxetine HBr (TRINTELLIX) 10 MG TABS tablet Take 1 tablet (10 mg total) by mouth daily. (Patient taking differently: Take 10 mg by mouth every evening. ) 30 tablet 2   No current facility-administered medications for this encounter.    Vitals:   07/03/18 1125  BP: 118/88  Pulse: 100  SpO2: 93%  Weight: 101.3 kg (223 lb 6.4 oz)     Wt Readings from Last 3 Encounters:  07/03/18 101.3 kg (223 lb 6.4 oz)  06/10/18 103.4 kg (228 lb)  06/03/18 102.1 kg (225 lb 1.4 oz)   General: NAD, obese Neck: Thick, JVP difficult but does not appear elevated, no thyromegaly or thyroid nodule.  Lungs: Clear to auscultation bilaterally with normal respiratory effort. CV: Nondisplaced PMI.  Heart regular S1/S2, no S3/S4, no murmur.  No peripheral edema.  No carotid bruit.  Normal pedal  pulses.  Abdomen: Soft, nontender, no hepatosplenomegaly, no distention.  Skin: Intact without lesions or rashes.  Neurologic: Alert and oriented x 3.  Psych: Normal affect. Extremities: No clubbing or cyanosis.  HEENT: Normal.   Assessment/Plan: 1. Chronic systolic CHF: Nonischemic cardiomyopathy.  No CAD on prior cath, prior cMRI showed no late gadolinium enhancement.  No family history of cardiomyopathy.  No ETOH/substance abuse.  No uncontrolled HTN.  Possible that she had viral myocarditis. Last echo in  03/11/18 showed LVEF up to 50-55% but with mildly dilated/dysfunctional RV, IVC appeared normal and PA pressure estimation was normal, septal bounce was noted. She is dyspneic out of proportion to perceived degree of heart failure.  REDS clip measure today was not elevated (27%).  I think that OHS/OSA plays a major role here => she has restriction of lung expansion by obesity and scoliosis.  With septal bounce on echo, constrictive pericarditis is a consideration though active asthma could have caused  this and non-dilated IVC goes against this diagnosis.  - Continue current dose of torsemide 40 mg daily.  - Continue Toprol XL 25 mg daily.  - Continue spironolactone 25 daily.   - Continue valsartan 80 mg daily.   - With marked dyspnea, I am going to arrange for RHC/hemodynamica LHC to assess filling pressures, assess for pulmonary hypertension, and assess for evidence of constrictive pericarditis (ventricular interdependence). We discussed risks/benefits of procedure and she agrees.  - Continue cardiac rehab.  2. Asthma: No wheezing on exam.  3. OHS/OSA: She uses CPAP at night and oxygen during the day.  OHS is likely due to lung restriction from obesity and scoliosis.   4. Obesity: Weight loss is imperative.  Reasonable to look into bariatric surgery.   Followup after cath.   Marca Ancona, MD  07/03/2018

## 2018-07-03 NOTE — Patient Instructions (Addendum)
Labs were done today. We will call you with any ABNORMAL results. No news is good news!  REDS VEST READING= 27% CHEST RULER=40  Your physician has requested that you have a lower or upper extremity venous duplex. This test is an ultrasound of the veins in the legs or arms. It looks at venous blood flow that carries blood from the heart to the legs or arms. Allow one hour for a Lower Venous exam. Allow thirty minutes for an Upper Venous exam. There are no restrictions or special instructions.  You have been referred to Bariatric Program. They will contact you to set up an appointment.       [facility] [address] [id number] [place] [address] Dept: [phone] Loc: [phone]  Cherita Mayor  07/03/2018  You are scheduled for a Cardiac Catheterization on Thursday, March 5 with Dr. Marca Ancona.  1. Please arrive at the [facility] (Main Entrance A) at [facility]: [address] [place], [place] [address] at 7:00 AM (This time is two hours before your procedure to ensure your preparation). Free valet parking service is available.   Special note: Every effort is made to have your procedure done on time. Please understand that emergencies sometimes delay scheduled procedures.  2. Diet: Do not eat solid foods after midnight.  The patient may have clear liquids until 5am upon the day of the procedure.   3. Medication instructions in preparation for your procedure:  DAY OF PROCEDURE: HOLD Torsemide and Spironolactone   On the morning of your procedure, take your Aspirin and any morning medicines NOT listed above.  You may use sips of water.  4. Plan for one night stay--bring personal belongings. 5. Bring a current list of your medications and current insurance cards. 6. You MUST have a responsible person to drive you home. 7. Someone MUST be with you the first 24 hours after  you arrive home or your discharge will be delayed. 8. Please wear clothes that are easy to get on and off and wear slip-on shoes.  Thank you for allowing [place] to care for you!   -- [facility]

## 2018-07-04 ENCOUNTER — Encounter (HOSPITAL_COMMUNITY): Payer: Commercial Managed Care - PPO

## 2018-07-07 ENCOUNTER — Encounter (HOSPITAL_COMMUNITY)
Admission: RE | Admit: 2018-07-07 | Discharge: 2018-07-07 | Disposition: A | Discharge status: Home / Self Care | Payer: Commercial Managed Care - PPO | Source: Ambulatory Visit | Attending: Cardiology | Admitting: Cardiology

## 2018-07-07 DIAGNOSIS — I5042 Chronic combined systolic (congestive) and diastolic (congestive) heart failure: Secondary | ICD-10-CM | POA: Insufficient documentation

## 2018-07-09 ENCOUNTER — Encounter (HOSPITAL_COMMUNITY)
Admission: RE | Admit: 2018-07-09 | Discharge: 2018-07-09 | Disposition: A | Discharge status: Home / Self Care | Payer: Commercial Managed Care - PPO | Source: Ambulatory Visit | Attending: Cardiology | Admitting: Cardiology

## 2018-07-09 DIAGNOSIS — I5042 Chronic combined systolic (congestive) and diastolic (congestive) heart failure: Secondary | ICD-10-CM

## 2018-07-10 ENCOUNTER — Encounter (HOSPITAL_COMMUNITY): Payer: Self-pay | Admitting: Cardiology

## 2018-07-10 ENCOUNTER — Encounter (HOSPITAL_COMMUNITY)
Admission: RE | Disposition: A | Discharge status: Home / Self Care | Payer: Self-pay | Source: Home / Self Care | Attending: Cardiology

## 2018-07-10 ENCOUNTER — Other Ambulatory Visit: Payer: Self-pay

## 2018-07-10 ENCOUNTER — Ambulatory Visit (HOSPITAL_COMMUNITY)
Admission: RE | Admit: 2018-07-10 | Discharge: 2018-07-10 | Disposition: A | Discharge status: Home / Self Care | Payer: Commercial Managed Care - PPO | Attending: Cardiology | Admitting: Cardiology

## 2018-07-10 DIAGNOSIS — J45909 Unspecified asthma, uncomplicated: Secondary | ICD-10-CM | POA: Diagnosis not present

## 2018-07-10 DIAGNOSIS — F329 Major depressive disorder, single episode, unspecified: Secondary | ICD-10-CM | POA: Diagnosis not present

## 2018-07-10 DIAGNOSIS — I5042 Chronic combined systolic (congestive) and diastolic (congestive) heart failure: Secondary | ICD-10-CM

## 2018-07-10 DIAGNOSIS — Z79890 Hormone replacement therapy (postmenopausal): Secondary | ICD-10-CM | POA: Diagnosis not present

## 2018-07-10 DIAGNOSIS — G4733 Obstructive sleep apnea (adult) (pediatric): Secondary | ICD-10-CM | POA: Insufficient documentation

## 2018-07-10 DIAGNOSIS — I509 Heart failure, unspecified: Secondary | ICD-10-CM | POA: Diagnosis not present

## 2018-07-10 DIAGNOSIS — E669 Obesity, unspecified: Secondary | ICD-10-CM | POA: Insufficient documentation

## 2018-07-10 DIAGNOSIS — Z79899 Other long term (current) drug therapy: Secondary | ICD-10-CM | POA: Diagnosis not present

## 2018-07-10 DIAGNOSIS — I5022 Chronic systolic (congestive) heart failure: Secondary | ICD-10-CM | POA: Insufficient documentation

## 2018-07-10 DIAGNOSIS — I272 Pulmonary hypertension, unspecified: Secondary | ICD-10-CM | POA: Diagnosis not present

## 2018-07-10 DIAGNOSIS — I428 Other cardiomyopathies: Secondary | ICD-10-CM | POA: Insufficient documentation

## 2018-07-10 DIAGNOSIS — E039 Hypothyroidism, unspecified: Secondary | ICD-10-CM | POA: Diagnosis not present

## 2018-07-10 HISTORY — PX: RIGHT AND LEFT HEART CATH: CATH118262

## 2018-07-10 LAB — POCT I-STAT EG7
Acid-Base Excess: 8 mmol/L — ABNORMAL HIGH (ref 0.0–2.0)
Acid-Base Excess: 9 mmol/L — ABNORMAL HIGH (ref 0.0–2.0)
Bicarbonate: 37.3 mmol/L — ABNORMAL HIGH (ref 20.0–28.0)
Bicarbonate: 39.2 mmol/L — ABNORMAL HIGH (ref 20.0–28.0)
Calcium, Ion: 1.17 mmol/L (ref 1.15–1.40)
Calcium, Ion: 1.16 mmol/L (ref 1.15–1.40)
HCT: 38 % (ref 36.0–46.0)
HCT: 38 % (ref 36.0–46.0)
HEMOGLOBIN: 12.9 g/dL (ref 12.0–15.0)
Hemoglobin: 12.9 g/dL (ref 12.0–15.0)
O2 SAT: 67 %
O2 Saturation: 67 %
PCO2 VEN: 76.6 mmHg — AB (ref 44.0–60.0)
PH VEN: 7.294 (ref 7.250–7.430)
POTASSIUM: 3.6 mmol/L (ref 3.5–5.1)
Potassium: 3.6 mmol/L (ref 3.5–5.1)
Sodium: 140 mmol/L (ref 135–145)
Sodium: 139 mmol/L (ref 135–145)
TCO2: 40 mmol/L — ABNORMAL HIGH (ref 22–32)
TCO2: 42 mmol/L — ABNORMAL HIGH (ref 22–32)
pCO2, Ven: 80.8 mmHg (ref 44.0–60.0)
pH, Ven: 7.296 (ref 7.250–7.430)
pO2, Ven: 41 mmHg (ref 32.0–45.0)
pO2, Ven: 41 mmHg (ref 32.0–45.0)

## 2018-07-10 LAB — PREGNANCY, URINE: Preg Test, Ur: NEGATIVE

## 2018-07-10 SURGERY — RIGHT AND LEFT HEART CATH

## 2018-07-10 MED ORDER — HEPARIN SODIUM (PORCINE) 1000 UNIT/ML IJ SOLN
INTRAMUSCULAR | Status: DC | PRN
  Administered 2018-07-10: 4500 [IU] via INTRAVENOUS

## 2018-07-10 MED ORDER — SODIUM CHLORIDE 0.9 % IV SOLN: 250.0000 mL | INTRAVENOUS | Status: DC | PRN

## 2018-07-10 MED ORDER — TORSEMIDE 20 MG PO TABS: 40.0000 mg | ORAL_TABLET | Freq: Two times a day (BID) | ORAL | 11 refills | Status: DC

## 2018-07-10 MED ORDER — POTASSIUM CHLORIDE ER 10 MEQ PO TBCR: 20.0000 meq | EXTENDED_RELEASE_TABLET | Freq: Every day | ORAL | 3 refills | Status: DC

## 2018-07-10 MED ORDER — VERAPAMIL HCL 2.5 MG/ML IV SOLN
INTRAVENOUS | Status: DC | PRN
  Administered 2018-07-10: 10 mL via INTRA_ARTERIAL

## 2018-07-10 MED ORDER — ONDANSETRON HCL 4 MG/2ML IJ SOLN: 4.0000 mg | Freq: Four times a day (QID) | INTRAMUSCULAR | Status: DC | PRN

## 2018-07-10 MED ORDER — ASPIRIN 81 MG PO CHEW
81.0000 mg | CHEWABLE_TABLET | ORAL | Status: AC
  Administered 2018-07-10: 81 mg via ORAL
  Filled 2018-07-10: qty 1

## 2018-07-10 MED ORDER — SODIUM CHLORIDE 0.9% FLUSH: 3.0000 mL | Freq: Two times a day (BID) | INTRAVENOUS | Status: DC

## 2018-07-10 MED ORDER — HEPARIN (PORCINE) IN NACL 1000-0.9 UT/500ML-% IV SOLN
INTRAVENOUS | Status: AC
  Filled 2018-07-10: qty 1000

## 2018-07-10 MED ORDER — ACETAMINOPHEN 325 MG PO TABS: 650.0000 mg | ORAL_TABLET | ORAL | Status: DC | PRN

## 2018-07-10 MED ORDER — HEPARIN SODIUM (PORCINE) 1000 UNIT/ML IJ SOLN
INTRAMUSCULAR | Status: AC
  Filled 2018-07-10: qty 1

## 2018-07-10 MED ORDER — FENTANYL CITRATE (PF) 100 MCG/2ML IJ SOLN
INTRAMUSCULAR | Status: AC
  Filled 2018-07-10: qty 2

## 2018-07-10 MED ORDER — FENTANYL CITRATE (PF) 100 MCG/2ML IJ SOLN
INTRAMUSCULAR | Status: DC | PRN
  Administered 2018-07-10 (×2): 25 ug via INTRAVENOUS
  Administered 2018-07-10: 50 ug via INTRAVENOUS

## 2018-07-10 MED ORDER — LIDOCAINE HCL (PF) 1 % IJ SOLN
INTRAMUSCULAR | Status: DC | PRN
  Administered 2018-07-10 (×2): 2 mL

## 2018-07-10 MED ORDER — SODIUM CHLORIDE 0.9 % IV SOLN
INTRAVENOUS | Status: DC
  Administered 2018-07-10: 08:00:00 via INTRAVENOUS

## 2018-07-10 MED ORDER — SODIUM CHLORIDE 0.9% FLUSH: 3.0000 mL | INTRAVENOUS | Status: DC | PRN

## 2018-07-10 MED ORDER — MIDAZOLAM HCL 2 MG/2ML IJ SOLN
INTRAMUSCULAR | Status: AC
  Filled 2018-07-10: qty 2

## 2018-07-10 MED ORDER — HEPARIN (PORCINE) IN NACL 1000-0.9 UT/500ML-% IV SOLN
INTRAVENOUS | Status: DC | PRN
  Administered 2018-07-10 (×2): 500 mL

## 2018-07-10 MED ORDER — MIDAZOLAM HCL 2 MG/2ML IJ SOLN
INTRAMUSCULAR | Status: DC | PRN
  Administered 2018-07-10 (×3): 1 mg via INTRAVENOUS

## 2018-07-10 MED ORDER — VERAPAMIL HCL 2.5 MG/ML IV SOLN
INTRAVENOUS | Status: AC
  Filled 2018-07-10: qty 2

## 2018-07-10 MED ORDER — LIDOCAINE HCL (PF) 1 % IJ SOLN
INTRAMUSCULAR | Status: AC
  Filled 2018-07-10: qty 30

## 2018-07-10 SURGICAL SUPPLY — 12 items
CATH BALLN WEDGE 5F 110CM (CATHETERS) ×3 IMPLANT
CATH INFINITI 5FR ANG PIGTAIL (CATHETERS) ×3 IMPLANT
DEVICE RAD COMP TR BAND LRG (VASCULAR PRODUCTS) ×3 IMPLANT
GLIDESHEATH SLEND SS 6F .021 (SHEATH) ×3 IMPLANT
GUIDEWIRE INQWIRE 1.5J.035X260 (WIRE) ×1 IMPLANT
INQWIRE 1.5J .035X260CM (WIRE) ×3
PACK CARDIAC CATHETERIZATION (CUSTOM PROCEDURE TRAY) ×3 IMPLANT
PROTECTION STATION PRESSURIZED (MISCELLANEOUS) ×3
SHEATH GLIDE SLENDER 4/5FR (SHEATH) ×3 IMPLANT
SHEATH PROBE COVER 6X72 (BAG) ×3 IMPLANT
STATION PROTECTION PRESSURIZED (MISCELLANEOUS) ×1 IMPLANT
TRANSDUCER W/STOPCOCK (MISCELLANEOUS) ×6 IMPLANT

## 2018-07-10 NOTE — Research (Addendum)
PIVA Informed Consent   Subject Name: Meredith Diaz  Subject did not meet all inclusion and exclusion criteria.  The informed consent form, study requirements and expectations were reviewed with the subject and questions and concerns were addressed prior to the signing of the consent form.  The subject verbalized understanding of the trail requirements.  The subject agreed to participate in the PIVA trial and signed the informed consent.  The informed consent was obtained prior to performance of any protocol-specific procedures for the subject.  A copy of the signed informed consent was given to the subject and a copy was placed in the subject's medical record.  Yes No Inclusion   [x]  []  Patient is ?38 years old.  [x]  []  Patient is undergoing in-patient or out-patient elective right heart catheterization.  [x]  []  Patient or legally-authorized representative has signed a written informed consent form (ICF) per 21 CFR Part 50.55(e).    Exclusion   []  [x]  Known restrictive cardiomyopathy (e.g. cardiac amyloidosis) or constrictive cardiac disease (e.g. constrictive pericarditis, cardiac tamponade).  []  [x]  Congenital heart disease other than patent foramen ovale, repaired atrial or ventricular septal defect.  []  [x]  The following valvular diseases: a. Aortic valvular disease greater than mild in severity, b. Mitral valvular disease greater than mild in severity c. Aortic or mitral valve replacement  []  [x]  Active irregular heart rhythms at the time of the study as determined by review of an electrocardiogram completed within the last 30 days (Occasional ectopic beats need not be excluded)  []  [x]  Active mechanical circulation (intra-aortic balloon pump, ventricular assist devices, extracorporeal membrane oxygenation, hemodialysis) running while the study is being performed.  []  [x]  Female patients who are pregnant  []  [x]  Patients who are currently participating in, or have within the past  30 days participated in, another interventional clinical study or have used another investigational device or drug within the past 30 days.  [x]  []   Patients with BMI >40 kg/m  []  [x]   Cardiac transplant within the last 3 months.   Patient BMI this A.M is 42.97 Patient is a screen fail.  Time of consent: 0802   ,  07/10/2018, [phone]

## 2018-07-10 NOTE — Progress Notes (Signed)
Cardiac Individual Treatment Plan  Patient Details  Name: Meredith Diaz MRN: [id number] Date of Birth: 23-May-1980 Referring Provider:     CARDIAC REHAB PHASE II ORIENTATION from 06/03/2018 in [place]  Referring Provider  Dr. Aundra Dubin      Initial Encounter Date:    CARDIAC REHAB PHASE II ORIENTATION from 06/03/2018 in [place]  Date  06/03/18      Visit Diagnosis: 05/2018 Chronic combined systolic and diastolic CHF (congestive heart failure) ([place])  Patient's Home Medications on Admission: No current facility-administered medications for this encounter.  No current outpatient medications on file.  Facility-Administered Medications Ordered in Other Encounters:  .  0.9 %  sodium chloride infusion, 250 mL, Intravenous, PRN, Larey Dresser, MD .  0.9 %  sodium chloride infusion, , Intravenous, Continuous, Larey Dresser, MD .  aspirin chewable tablet 81 mg, 81 mg, Oral, Pre-Cath, Larey Dresser, MD .  sodium chloride flush (NS) 0.9 % injection 3 mL, 3 mL, Intravenous, Q12H, McLean, Dalton S, MD .  sodium chloride flush (NS) 0.9 % injection 3 mL, 3 mL, Intravenous, PRN, Larey Dresser, MD  Past Medical History: Past Medical History:  Diagnosis Date  . Anxiety   . Asthma   . Back pain   . Chronic combined systolic and diastolic CHF (congestive heart failure) ([place]) 07/2011   a. Prior Echo: EF was 25-30%; b. No Sig CAD on CATH; c. No significant finding on Cardiac MRI; d. follow-up Echo 2014 --  EF of 50%;  e. 03/2014 Echo: EF 40-45%, gr 1 DD; f. 11/2014 Echo: EF 40-45%, Gr 2 DD.  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  . Depression   . Dyspnea   . Essential hypertension   . Family history of breast cancer   . Family history of colon cancer   .  Fibromyalgia   . Gallbladder problem   . Hypothyroidism   . IBS (irritable bowel syndrome)   . Morbid obesity ([place])   . Nonischemic cardiomyopathy ([place])    a. unclear etiology;  b. 01/2012 Cardiac MRI: no infiltrative pathology, EF 52%;  b. 11/2014 Echo: EF 40-45%, gr 2 DD.  [name] Scoliosis   . Sleep apnea   . Stomach problems   . Thyroid disease   . Vitamin B 12 deficiency   . Vitamin D deficiency     Tobacco Use: Social History   Tobacco Use  Smoking Status Never Smoker  Smokeless Tobacco Never Used    Labs: Recent Review Flowsheet Data    Labs for ITP Cardiac and Pulmonary Rehab Latest Ref Rng & Units 01/28/2014 11/01/2014 06/15/2015 08/07/2017 04/16/2018   Cholestrol 100 - 199 mg/dL 176 - - 197 -   LDLCALC 0 - 99 mg/dL 107(H) - - 124(H) -   HDL >39 mg/dL 49.80 - - 49 -   Trlycerides 0 - 149 mg/dL 96.0 - - 122 -   Hemoglobin A1c 4.6 - 6.5 % - 5.7 5.6 5.9(H) 6.4      Capillary Blood Glucose: Lab Results  Component Value Date   GLUCAP 155 (H) 01/27/2015     Exercise Target Goals: Exercise Program Goal: Individual exercise prescription set using results from initial 6 min walk test and THRR while considering  patient's activity barriers and safety.  Exercise Prescription Goal: Initial exercise prescription builds to 30-45 minutes a day of aerobic activity, 2-3 days per week.  Home exercise guidelines will be given to patient during program as part of exercise prescription that the participant will acknowledge.  Activity Barriers & Risk Stratification: Activity Barriers & Cardiac Risk Stratification - 06/03/18 1154      Activity Barriers & Cardiac Risk Stratification   Activity Barriers  Joint Problems;Deconditioning;Muscular Weakness;Shortness of Breath;Other (comment)    Comments  O2 use. Hip pain.     Cardiac Risk Stratification  High       6 Minute Walk: 6 Minute Walk    Row Name 06/03/18 1152         6 Minute Walk   Phase  Initial     Distance  600 feet      Walk Time  6 minutes     # of Rest Breaks  1     MPH  1.1     METS  2.44     RPE  17     Perceived Dyspnea   3     VO2 Peak  8.55     Symptoms  Yes (comment)     Comments  SOB     Resting HR  84 bpm     Resting BP  104/60     Resting Oxygen Saturation   91 %     Exercise Oxygen Saturation  during 6 min walk  85 %     Max Ex. HR  116 bpm     Max Ex. BP  104/60     2 Minute Post BP  108/64        Oxygen Initial Assessment:   Oxygen Re-Evaluation:   Oxygen Discharge (Final Oxygen Re-Evaluation):   Initial Exercise Prescription: Initial Exercise Prescription - 06/03/18 1200      Date of Initial Exercise RX and Referring Provider   Date  06/03/18    Referring Provider  Dr. Aundra Dubin    Expected Discharge Date  09/12/18      Oxygen   Oxygen  Continuous      NuStep   Level  2    SPM  75    Minutes  20    METs  2      Track   Laps  8    Minutes  10    METs  2.38      Prescription Details   Frequency (times per week)  3    Duration  Progress to 30 minutes of continuous aerobic without signs/symptoms of physical distress      Intensity   THRR 40-80% of Max Heartrate  73-146    Ratings of Perceived Exertion  11-13    Perceived Dyspnea  0-4      Progression   Progression  Continue to progress workloads to maintain intensity without signs/symptoms of physical distress.      Resistance Training   Training Prescription  Yes    Weight  2 lbs.     Reps  10-15       Perform Capillary Blood Glucose checks as needed.  Exercise Prescription Changes: Exercise Prescription Changes    Row Name 06/09/18 0900 06/23/18 1412           Response to Exercise   Blood Pressure (Admit)  120/80  106/70      Blood Pressure (Exercise)  124/70  110/70      Blood Pressure (Exit)  112/60  110/62  Heart Rate (Admit)  94 bpm  99 bpm      Heart Rate (Exercise)  119 bpm  115 bpm      Heart Rate (Exit)  85 bpm  99 bpm      Oxygen Saturation (Admit)  92 %  94 %      Oxygen  Saturation (Exercise)  82 %  94 %      Oxygen Saturation (Exit)  89 %  95 %      Rating of Perceived Exertion (Exercise)  14  14      Perceived Dyspnea (Exercise)  2  3      Symptoms  None  SOB      Comments  Pt oriented to exercise equipment   None      Duration  Progress to 30 minutes of  aerobic without signs/symptoms of physical distress  Progress to 30 minutes of  aerobic without signs/symptoms of physical distress      Intensity  THRR unchanged  THRR unchanged        Progression   Progression  Continue to progress workloads to maintain intensity without signs/symptoms of physical distress.  Continue to progress workloads to maintain intensity without signs/symptoms of physical distress.      Average METs  2.2  2.1        Resistance Training   Training Prescription  No  Yes      Weight  -  2lbs      Reps  -  10-15      Time  -  10 Minutes        Oxygen   Oxygen  Continuous  Continuous        NuStep   Level  2  2      SPM  75  80      Minutes  20  20      METs  2  2.2        Track   Laps  4  5      Minutes  10  10      METs  1  1.87         Exercise Comments: Exercise Comments    Row Name 06/09/18 0912 07/09/18 1031         Exercise Comments  Pt's first day of exercise. Pt was oriented to exercise equipment. Pt responded well to workloads. Will continue to monitor and encourage pt.   Pt is continuing to respond well to exercise workloads. Pt has needed a lot of emotional support from staff regrading health. Will follow up with pt regarding home exercise program when pt is in better spirits and open to information.         Exercise Goals and Review: Exercise Goals    Row Name 06/03/18 1155             Exercise Goals   Increase Physical Activity  Yes       Intervention  Provide advice, education, support and counseling about physical activity/exercise needs.;Develop an individualized exercise prescription for aerobic and resistive training based on initial  evaluation findings, risk stratification, comorbidities and participant's personal goals.       Expected Outcomes  Short Term: Attend rehab on a regular basis to increase amount of physical activity.       Increase Strength and Stamina  Yes       Intervention  Provide advice, education, support and counseling about physical activity/exercise needs.;Develop an individualized  exercise prescription for aerobic and resistive training based on initial evaluation findings, risk stratification, comorbidities and participant's personal goals.       Expected Outcomes  Short Term: Increase workloads from initial exercise prescription for resistance, speed, and METs.       Able to understand and use rate of perceived exertion (RPE) scale  Yes       Intervention  Provide education and explanation on how to use RPE scale       Expected Outcomes  Short Term: Able to use RPE daily in rehab to express subjective intensity level;Long Term:  Able to use RPE to guide intensity level when exercising independently       Able to understand and use Dyspnea scale  Yes       Intervention  Provide education and explanation on how to use Dyspnea scale       Expected Outcomes  Short Term: Able to use Dyspnea scale daily in rehab to express subjective sense of shortness of breath during exertion;Long Term: Able to use Dyspnea scale to guide intensity level when exercising independently       Knowledge and understanding of Target Heart Rate Range (THRR)  Yes       Intervention  Provide education and explanation of THRR including how the numbers were predicted and where they are located for reference       Expected Outcomes  Short Term: Able to state/look up THRR;Long Term: Able to use THRR to govern intensity when exercising independently;Short Term: Able to use daily as guideline for intensity in rehab       Able to check pulse independently  Yes       Intervention  Provide education and demonstration on how to check pulse in  carotid and radial arteries.;Review the importance of being able to check your own pulse for safety during independent exercise       Expected Outcomes  Short Term: Able to explain why pulse checking is important during independent exercise;Long Term: Able to check pulse independently and accurately       Understanding of Exercise Prescription  Yes       Intervention  Provide education, explanation, and written materials on patient's individual exercise prescription       Expected Outcomes  Short Term: Able to explain program exercise prescription;Long Term: Able to explain home exercise prescription to exercise independently          Exercise Goals Re-Evaluation :   Discharge Exercise Prescription (Final Exercise Prescription Changes): Exercise Prescription Changes - 06/23/18 1412      Response to Exercise   Blood Pressure (Admit)  106/70    Blood Pressure (Exercise)  110/70    Blood Pressure (Exit)  110/62    Heart Rate (Admit)  99 bpm    Heart Rate (Exercise)  115 bpm    Heart Rate (Exit)  99 bpm    Oxygen Saturation (Admit)  94 %    Oxygen Saturation (Exercise)  94 %    Oxygen Saturation (Exit)  95 %    Rating of Perceived Exertion (Exercise)  14    Perceived Dyspnea (Exercise)  3    Symptoms  SOB    Comments  None    Duration  Progress to 30 minutes of  aerobic without signs/symptoms of physical distress    Intensity  THRR unchanged      Progression   Progression  Continue to progress workloads to maintain intensity without signs/symptoms of physical distress.  Average METs  2.1      Resistance Training   Training Prescription  Yes    Weight  2lbs    Reps  10-15    Time  10 Minutes      Oxygen   Oxygen  Continuous      NuStep   Level  2    SPM  80    Minutes  20    METs  2.2      Track   Laps  5    Minutes  10    METs  1.87       Nutrition:  Target Goals: Understanding of nutrition guidelines, daily intake of sodium <158m, cholesterol <2086m  calories 30% from fat and 7% or less from saturated fats, daily to have 5 or more servings of fruits and vegetables.  Biometrics: Pre Biometrics - 06/03/18 1155      Pre Biometrics   Height  5' (1.524 m)    Weight  102.1 kg    Waist Circumference  47 inches    Hip Circumference  53.5 inches    Waist to Hip Ratio  0.88 %    BMI (Calculated)  43.96    Triceps Skinfold  25 mm    % Body Fat  50.5 %    Grip Strength  24 kg    Flexibility  0 in    Single Leg Stand  22.06 seconds        Nutrition Therapy Plan and Nutrition Goals: Nutrition Therapy & Goals - 06/03/18 1114      Nutrition Therapy   Diet  heart healthy, carb modified      Personal Nutrition Goals   Nutrition Goal  Pt to identify and limit food sources of saturated fat, trans fat, refined carbohydrates and sodium    Personal Goal #2  Pt to build a healthy plate including vegetables, fruits, whole grains, and low-fat dairy products in a heart healthy meal plan    Personal Goal #3  Pt able to name foods that affect blood glucose    Personal Goal #4  Pt to identify food quantities necessary to achieve weight loss of 6-24 lb at graduation from cardiac rehab.       Intervention Plan   Intervention  Prescribe, educate and counsel regarding individualized specific dietary modifications aiming towards targeted core components such as weight, hypertension, lipid management, diabetes, heart failure and other comorbidities.    Expected Outcomes  Short Term Goal: Understand basic principles of dietary content, such as calories, fat, sodium, cholesterol and nutrients.;Long Term Goal: Adherence to prescribed nutrition plan.       Nutrition Assessments: Nutrition Assessments - 06/03/18 1115      MEDFICTS Scores   Pre Score  21       Nutrition Goals Re-Evaluation:   Nutrition Goals Re-Evaluation:   Nutrition Goals Discharge (Final Nutrition Goals Re-Evaluation):   Psychosocial: Target Goals: Acknowledge presence or  absence of significant depression and/or stress, maximize coping skills, provide positive support system. Participant is able to verbalize types and ability to use techniques and skills needed for reducing stress and depression.  Initial Review & Psychosocial Screening: Initial Psych Review & Screening - 06/03/18 1126      Initial Review   Current issues with  Current Stress Concerns;History of Depression;Current Anxiety/Panic;Current Depression    Source of Stress Concerns  Chronic Illness;Unable to participate in former interests or hobbies;Unable to perform yard/household activities    Comments  declining health, self disappointment,  feelings of failure and hopelessness      Family Dynamics   Good Support System?  Yes   husband, friends, therapist      Barriers   Psychosocial barriers to participate in program  The patient should benefit from training in stress management and relaxation.;Psychosocial barriers identified (see note)      Screening Interventions   Interventions  Encouraged to exercise;Provide feedback about the scores to participant    Expected Outcomes  Short Term goal: Utilizing psychosocial counselor, staff and physician to assist with identification of specific Stressors or current issues interfering with healing process. Setting desired goal for each stressor or current issue identified.;Long Term Goal: Stressors or current issues are controlled or eliminated.;Short Term goal: Identification and review with participant of any Quality of Life or Depression concerns found by scoring the questionnaire.;Long Term goal: The participant improves quality of Life and PHQ9 Scores as seen by post scores and/or verbalization of changes       Quality of Life Scores: Quality of Life - 06/03/18 1132      Quality of Life Scores   Health/Function Pre  8.97 %    Socioeconomic Pre  22 %    Psych/Spiritual Pre  11.5 %    Family Pre  21.4 %    GLOBAL Pre  14.24 %      Scores of 19  and below usually indicate a poorer quality of life in these areas.  A difference of  2-3 points is a clinically meaningful difference.  A difference of 2-3 points in the total score of the Quality of Life Index has been associated with significant improvement in overall quality of life, self-image, physical symptoms, and general health in studies assessing change in quality of life.  PHQ-9: Recent Review Flowsheet Data    Depression screen [facility] 2/9 06/09/2018 04/16/2018 08/07/2017   Decreased Interest '3 3 2   ' Down, Depressed, Hopeless '3 3 3   ' PHQ - 2 Score '6 6 5   ' Altered sleeping '2 2 2   ' Tired, decreased energy '3 3 3   ' Change in appetite '2 2 3   ' Feeling bad or failure about yourself  '3 3 3   ' Trouble concentrating '2 2 2   ' Moving slowly or fidgety/restless '2 2 2   ' Suicidal thoughts 0 0 0   PHQ-9 Score '20 20 20   ' Difficult doing work/chores Very difficult - Very difficult     Interpretation of Total Score  Total Score Depression Severity:  1-4 = Minimal depression, 5-9 = Mild depression, 10-14 = Moderate depression, 15-19 = Moderately severe depression, 20-27 = Severe depression   Psychosocial Evaluation and Intervention: Psychosocial Evaluation - 06/09/18 0841      Psychosocial Evaluation & Interventions   Interventions  Therapist referral;Stress management education;Encouraged to exercise with the program and follow exercise prescription;Relaxation education    Comments  Haidyn has a negative outlook.  She feels as if she is a failure for returning to cardiac rehab.  Rhys is not hopeful for any improvements in her health with participation in cardiac rehab. Theora is currently seeing a therapist.  Tierre enjoys watching TV and spending time with her dogs.     Expected Outcomes  Forestine will continue to utilize her resources to help with her outlook.  Emotional support given as needed.     Continue Psychosocial Services   Follow up required by counselor       Psychosocial  Re-Evaluation: Psychosocial Re-Evaluation    Row  Name 06/12/18 1447 07/09/18 1639           Psychosocial Re-Evaluation   Current issues with  Current Depression;Current Anxiety/Panic;Current Stress Concerns  Current Depression;Current Anxiety/Panic;Current Stress Concerns      Comments  Shatoya has a negative outlook.  She feels as if she is a failure for returning to cardiac rehab.  Dorinne is not hopeful for any improvements in her health with participation in cardiac rehab. Renita is currently seeing a therapist.   Cleta continues to have a negative outlook.  She is anxious for her upcoming tests.  Emotional support given.       Expected Outcomes  Kamoria will continue to utilize her resources to help with her outlook.  Emotional support given as needed.   Pascha will continue to utilize her resources to help with her outlook.  Emotional support given as needed.       Interventions  Therapist referral;Stress management education;Relaxation education;Encouraged to attend Cardiac Rehabilitation for the exercise  Therapist referral;Stress management education;Relaxation education;Encouraged to attend Cardiac Rehabilitation for the exercise      Continue Psychosocial Services   Follow up required by counselor  Follow up required by counselor         Psychosocial Discharge (Final Psychosocial Re-Evaluation): Psychosocial Re-Evaluation - 07/09/18 1639      Psychosocial Re-Evaluation   Current issues with  Current Depression;Current Anxiety/Panic;Current Stress Concerns    Comments  Lanora continues to have a negative outlook.  She is anxious for her upcoming tests.  Emotional support given.     Expected Outcomes  Shelisha will continue to utilize her resources to help with her outlook.  Emotional support given as needed.     Interventions  Therapist referral;Stress management education;Relaxation education;Encouraged to attend Cardiac Rehabilitation for the exercise    Continue Psychosocial Services    Follow up required by counselor       Vocational Rehabilitation: Provide vocational rehab assistance to qualifying candidates.   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 Participant will state understanding/return demonstration of topics presented.  Learning Barriers/Preferences: Learning Barriers/Preferences - 06/03/18 1156      Learning Barriers/Preferences   Learning Barriers  Sight;Exercise Concerns   Dizziness   Learning Preferences  Written Material;Skilled Demonstration       Education Topics: Count Your Pulse:  -Group instruction provided by verbal instruction, demonstration, patient participation and written materials to support subject.  Instructors address importance of being able to find your pulse and how to count your pulse when at home without a heart monitor.  Patients get hands on experience counting their pulse with staff help and individually.   Heart Attack, Angina, and Risk Factor Modification:  -Group instruction provided by verbal instruction, video, and written materials to support subject.  Instructors address signs and symptoms of angina and heart attacks.    Also discuss risk factors for heart disease and how to make changes to improve heart health risk factors.   Functional Fitness:  -Group instruction provided by verbal instruction, demonstration, patient participation, and written materials to support subject.  Instructors address safety measures for doing things around the house.  Discuss how to get up and down off the floor, how to pick things up  properly, how to safely get out of a chair without assistance, and balance training.   Meditation and Mindfulness:  -Group  instruction provided by verbal instruction, patient participation, and written materials to support subject.  Instructor addresses importance of mindfulness and meditation practice to help reduce stress and improve awareness.  Instructor also leads participants through a meditation exercise.    Stretching for Flexibility and Mobility:  -Group instruction provided by verbal instruction, patient participation, and written materials to support subject.  Instructors lead participants through series of stretches that are designed to increase flexibility thus improving mobility.  These stretches are additional exercise for major muscle groups that are typically performed during regular warm up and cool down.   Hands Only CPR:  -Group verbal, video, and participation provides a basic overview of AHA guidelines for community CPR. Role-play of emergencies allow participants the opportunity to practice calling for help and chest compression technique with discussion of AED use.   Hypertension: -Group verbal and written instruction that provides a basic overview of hypertension including the most recent diagnostic guidelines, risk factor reduction with self-care instructions and medication management.    Nutrition I class: Heart Healthy Eating:  -Group instruction provided by PowerPoint slides, verbal discussion, and written materials to support subject matter. The instructor gives an explanation and review of the Therapeutic Lifestyle Changes diet recommendations, which includes a discussion on lipid goals, dietary fat, sodium, fiber, plant stanol/sterol esters, sugar, and the components of a well-balanced, healthy diet.   Nutrition II class: Lifestyle Skills:  -Group instruction provided by PowerPoint slides, verbal discussion, and written materials to support subject matter. The instructor gives an explanation and review of label reading, grocery shopping for heart health, heart healthy recipe  modifications, and ways to make healthier choices when eating out.   Diabetes Question & Answer:  -Group instruction provided by PowerPoint slides, verbal discussion, and written materials to support subject matter. The instructor gives an explanation and review of diabetes co-morbidities, pre- and post-prandial blood glucose goals, pre-exercise blood glucose goals, signs, symptoms, and treatment of hypoglycemia and hyperglycemia, and foot care basics.   Diabetes Blitz:  -Group instruction provided by PowerPoint slides, verbal discussion, and written materials to support subject matter. The instructor gives an explanation and review of the physiology behind type 1 and type 2 diabetes, diabetes medications and rational behind using different medications, pre- and post-prandial blood glucose recommendations and Hemoglobin A1c goals, diabetes diet, and exercise including blood glucose guidelines for exercising safely.    Portion Distortion:  -Group instruction provided by PowerPoint slides, verbal discussion, written materials, and food models to support subject matter. The instructor gives an explanation of serving size versus portion size, changes in portions sizes over the last 20 years, and what consists of a serving from each food group.   Stress Management:  -Group instruction provided by verbal instruction, video, and written materials to support subject matter.  Instructors review role of stress in heart disease and how to cope with stress positively.     Exercising on Your Own:  -Group instruction provided by verbal instruction, power point, and written materials to support subject.  Instructors discuss benefits of exercise, components of exercise, frequency and intensity of exercise, and end points for exercise.  Also discuss use of nitroglycerin and activating EMS.  Review options of places to exercise outside of rehab.  Review guidelines for sex with heart disease.   Cardiac Drugs I:   -Group instruction provided by verbal instruction and written materials  to support subject.  Instructor reviews cardiac drug classes: antiplatelets, anticoagulants, beta blockers, and statins.  Instructor discusses reasons, side effects, and lifestyle considerations for each drug class.   Cardiac Drugs II:  -Group instruction provided by verbal instruction and written materials to support subject.  Instructor reviews cardiac drug classes: angiotensin converting enzyme inhibitors (ACE-I), angiotensin II receptor blockers (ARBs), nitrates, and calcium channel blockers.  Instructor discusses reasons, side effects, and lifestyle considerations for each drug class.   Anatomy and Physiology of the Circulatory System:  Group verbal and written instruction and models provide basic cardiac anatomy and physiology, with the coronary electrical and arterial systems. Review of: AMI, Angina, Valve disease, Heart Failure, Peripheral Artery Disease, Cardiac Arrhythmia, Pacemakers, and the ICD.   Other Education:  -Group or individual verbal, written, or video instructions that support the educational goals of the cardiac rehab program.   Holiday Eating Survival Tips:  -Group instruction provided by PowerPoint slides, verbal discussion, and written materials to support subject matter. The instructor gives patients tips, tricks, and techniques to help them not only survive but enjoy the holidays despite the onslaught of food that accompanies the holidays.   Knowledge Questionnaire Score: Knowledge Questionnaire Score - 06/03/18 1134      Knowledge Questionnaire Score   Pre Score  23/24       Core Components/Risk Factors/Patient Goals at Admission: Personal Goals and Risk Factors at Admission - 06/03/18 1157      Core Components/Risk Factors/Patient Goals on Admission    Weight Management  Yes;Obesity;Weight Maintenance    Intervention  Weight Management: Develop a combined nutrition and exercise  program designed to reach desired caloric intake, while maintaining appropriate intake of nutrient and fiber, sodium and fats, and appropriate energy expenditure required for the weight goal.;Weight Management: Provide education and appropriate resources to help participant work on and attain dietary goals.;Weight Management/Obesity: Establish reasonable short term and long term weight goals.;Obesity: Provide education and appropriate resources to help participant work on and attain dietary goals.    Admit Weight  225 lb 1.4 oz (102.1 kg)    Expected Outcomes  Short Term: Continue to assess and modify interventions until short term weight is achieved;Long Term: Adherence to nutrition and physical activity/exercise program aimed toward attainment of established weight goal;Weight Maintenance: Understanding of the daily nutrition guidelines, which includes 25-35% calories from fat, 7% or less cal from saturated fats, less than 249m cholesterol, less than 1.5gm of sodium, & 5 or more servings of fruits and vegetables daily;Understanding recommendations for meals to include 15-35% energy as protein, 25-35% energy from fat, 35-60% energy from carbohydrates, less than 2074mof dietary cholesterol, 20-35 gm of total fiber daily;Understanding of distribution of calorie intake throughout the day with the consumption of 4-5 meals/snacks;Weight Loss: Understanding of general recommendations for a balanced deficit meal plan, which promotes 1-2 lb weight loss per week and includes a negative energy balance of [phone] kcal/d    Improve shortness of breath with ADL's  Yes    Intervention  Provide education, individualized exercise plan and daily activity instruction to help decrease symptoms of SOB with activities of daily living.    Expected Outcomes  Short Term: Improve cardiorespiratory fitness to achieve a reduction of symptoms when performing ADLs;Long Term: Be able to perform more ADLs without symptoms or delay the  onset of symptoms    Heart Failure  Yes    Intervention  Provide a combined exercise and nutrition program that is supplemented with education, support and  counseling about heart failure. Directed toward relieving symptoms such as shortness of breath, decreased exercise tolerance, and extremity edema.    Expected Outcomes  Improve functional capacity of life    Hypertension  Yes    Intervention  Provide education on lifestyle modifcations including regular physical activity/exercise, weight management, moderate sodium restriction and increased consumption of fresh fruit, vegetables, and low fat dairy, alcohol moderation, and smoking cessation.;Monitor prescription use compliance.    Expected Outcomes  Short Term: Continued assessment and intervention until BP is < 140/25m HG in hypertensive participants. < 130/835mHG in hypertensive participants with diabetes, heart failure or chronic kidney disease.;Long Term: Maintenance of blood pressure at goal levels.    Lipids  Yes    Intervention  Provide education and support for participant on nutrition & aerobic/resistive exercise along with prescribed medications to achieve LDL <706mHDL >81m60m  Expected Outcomes  Short Term: Participant states understanding of desired cholesterol values and is compliant with medications prescribed. Participant is following exercise prescription and nutrition guidelines.;Long Term: Cholesterol controlled with medications as prescribed, with individualized exercise RX and with personalized nutrition plan. Value goals: LDL < 70mg42mL > 40 mg.    Stress  Yes    Intervention  Offer individual and/or small group education and counseling on adjustment to heart disease, stress management and health-related lifestyle change. Teach and support self-help strategies.;Refer participants experiencing significant psychosocial distress to appropriate mental health specialists for further evaluation and treatment. When possible, include  family members and significant others in education/counseling sessions.    Expected Outcomes  Short Term: Participant demonstrates changes in health-related behavior, relaxation and other stress management skills, ability to obtain effective social support, and compliance with psychotropic medications if prescribed.;Long Term: Emotional wellbeing is indicated by absence of clinically significant psychosocial distress or social isolation.       Core Components/Risk Factors/Patient Goals Review:  Goals and Risk Factor Review    Row Name 06/09/18 0846 07/09/18 1638           Core Components/Risk Factors/Patient Goals Review   Personal Goals Review  Weight Management/Obesity;Heart Failure;Lipids;Stress;Hypertension;Improve shortness of breath with ADL's  Weight Management/Obesity;Heart Failure;Lipids;Stress;Hypertension;Improve shortness of breath with ADL's      Review  Pt with multiple CAD RFs participating in CR. EllisDavitad like to increase her activity level and feel comfortable with exercise.   EllisLiorinues to participate in CR.  She is increasing her MET levels despite feeling "like I can't breathe."  Plan for testing 07/10/2018.  Will follow along with patient.       Expected Outcomes  Pt will continue to participate in CR exercise, nutrition, and lifestyle modification opportunities.   Pt will continue to participate in CR exercise, nutrition, and lifestyle modification opportunities.          Core Components/Risk Factors/Patient Goals at Discharge (Final Review):  Goals and Risk Factor Review - 07/09/18 1638      Core Components/Risk Factors/Patient Goals Review   Personal Goals Review  Weight Management/Obesity;Heart Failure;Lipids;Stress;Hypertension;Improve shortness of breath with ADL's    Review  EllisTheclainues to participate in CR.  She is increasing her MET levels despite feeling "like I can't breathe."  Plan for testing 07/10/2018.  Will follow along with patient.     Expected  Outcomes  Pt will continue to participate in CR exercise, nutrition, and lifestyle modification opportunities.        ITP Comments: ITP Comments    Row Name 06/03/18 1126 06/09/18 [id number]  07/09/18 1637       ITP Comments  Dr. Fransico Him, Medical Director   30 Day ITP Review.  Pt started exercise today and tolerated it well.   30 Day ITP Review. Jojo continues to participate in CR.  She is increasing her MET levels despite feeling "like I can't breathe."  Oxygen levels stable on 4L.  Plan for testing 07/10/2018 to evaluate breathing.  Will follow along with patient.         Comments: See ITP Comments.

## 2018-07-10 NOTE — Discharge Instructions (Signed)
Ulnar Site Care  This sheet gives you information about how to care for yourself after your procedure. Your health care provider may also give you more specific instructions. If you have problems or questions, contact your health care provider. What can I expect after the procedure? After the procedure, it is common to have:  Bruising and tenderness at the catheter insertion area. Follow these instructions at home: Medicines  Take over-the-counter and prescription medicines only as told by your health care provider. Insertion site care  Follow instructions from your health care provider about how to take care of your insertion site. Make sure you: ? Wash your hands with soap and water before you change your bandage (dressing). If soap and water are not available, use hand sanitizer. ? Change your dressing as told by your health care provider. ? Leave stitches (sutures), skin glue, or adhesive strips in place. These skin closures may need to stay in place for 2 weeks or longer. If adhesive strip edges start to loosen and curl up, you may trim the loose edges. Do not remove adhesive strips completely unless your health care provider tells you to do that.  Check your insertion site every day for signs of infection. Check for: ? Redness, swelling, or pain. ? Fluid or blood. ? Pus or a bad smell. ? Warmth.  Do not take baths, swim, or use a hot tub until your health care provider approves.  You may shower 24-48 hours after the procedure, or as directed by your health care provider. ? Remove the dressing and gently wash the site with plain soap and water. ? Pat the area dry with a clean towel. ? Do not rub the site. That could cause bleeding.  Do not apply powder or lotion to the site. Activity   For 24 hours after the procedure, or as directed by your health care provider: ? Do not flex or bend the affected arm. ? Do not push or pull heavy objects with the affected arm. ? Do not  drive yourself home from the hospital or clinic. You may drive 24 hours after the procedure unless your health care provider tells you not to. ? Do not operate machinery or power tools.  Do not lift anything that is heavier than 10 lb (4.5 kg), or the limit that you are told, until your health care provider says that it is safe.  Ask your health care provider when it is okay to: ? Return to work or school. ? Resume usual physical activities or sports. ? Resume sexual activity. General instructions  If the catheter site starts to bleed, raise your arm and put firm pressure on the site. If the bleeding does not stop, get help right away. This is a medical emergency.  If you went home on the same day as your procedure, a responsible adult should be with you for the first 24 hours after you arrive home.  Keep all follow-up visits as told by your health care provider. This is important. Contact a health care provider if:  You have a fever.  You have redness, swelling, or yellow drainage around your insertion site. Get help right away if:  You have unusual pain at the radial site.  The catheter insertion area swells very fast.  The insertion area is bleeding, and the bleeding does not stop when you hold steady pressure on the area.  Your arm or hand becomes pale, cool, tingly, or numb. These symptoms may represent a serious problem  that is an emergency. Do not wait to see if the symptoms will go away. Get medical help right away. Call your local emergency services (911 in the [place] Do not drive yourself to the hospital. °Summary °· After the procedure, it is common to have bruising and tenderness at the site. °· Follow instructions from your health care provider about how to take care of your radial site wound. Check the wound every day for signs of infection. °· Do not lift anything that is heavier than 10 lb (4.5 kg), or the limit that you are told, until your health care provider says  that it is safe. °This information is not intended to replace advice given to you by your health care provider. Make sure you discuss any questions you have with your health care provider. °Document Released: 05/26/2010 Document Revised: 05/29/2017 Document Reviewed: 05/29/2017 °[employer]vier Interactive Patient Education © 2019 [employer] ° °

## 2018-07-11 ENCOUNTER — Encounter (HOSPITAL_COMMUNITY): Payer: Commercial Managed Care - PPO

## 2018-07-12 DIAGNOSIS — J449 Chronic obstructive pulmonary disease, unspecified: Secondary | ICD-10-CM | POA: Diagnosis not present

## 2018-07-14 ENCOUNTER — Ambulatory Visit: Payer: Commercial Managed Care - PPO | Admitting: Pulmonary Disease

## 2018-07-14 ENCOUNTER — Encounter (HOSPITAL_COMMUNITY): Payer: Commercial Managed Care - PPO

## 2018-07-16 ENCOUNTER — Encounter (HOSPITAL_COMMUNITY): Payer: Commercial Managed Care - PPO

## 2018-07-18 ENCOUNTER — Encounter (HOSPITAL_COMMUNITY)
Admission: RE | Admit: 2018-07-18 | Discharge: 2018-07-18 | Disposition: A | Discharge status: Home / Self Care | Payer: Commercial Managed Care - PPO | Source: Ambulatory Visit | Attending: Cardiology | Admitting: Cardiology

## 2018-07-18 VITALS — BP 112/70 | HR 98 | Wt 225.1 lb

## 2018-07-18 DIAGNOSIS — I5042 Chronic combined systolic (congestive) and diastolic (congestive) heart failure: Secondary | ICD-10-CM | POA: Diagnosis not present

## 2018-07-21 ENCOUNTER — Encounter (HOSPITAL_COMMUNITY): Payer: Commercial Managed Care - PPO

## 2018-07-21 ENCOUNTER — Other Ambulatory Visit (HOSPITAL_COMMUNITY): Payer: Self-pay | Admitting: Cardiology

## 2018-07-23 ENCOUNTER — Encounter (HOSPITAL_COMMUNITY): Payer: Commercial Managed Care - PPO

## 2018-07-25 ENCOUNTER — Encounter (HOSPITAL_COMMUNITY): Payer: Commercial Managed Care - PPO

## 2018-07-25 ENCOUNTER — Telehealth (HOSPITAL_COMMUNITY): Payer: Self-pay | Admitting: *Deleted

## 2018-07-25 NOTE — Telephone Encounter (Signed)
Social call placed to patient.  Pt is practicing social distancing and staying indoors.  Pt inquired what she could be doing for exercise while CR is closed.  Informed pt that she could walk inside her home and outside in her neighborhood ensuring to maintain social distancing from others while outside.  Pt has been walking in her living room but feels confined.  Encouraged pt to call or video chat friends to decrease any feelings of isolation.  Pt grateful for call.

## 2018-07-28 ENCOUNTER — Encounter (HOSPITAL_COMMUNITY): Payer: Commercial Managed Care - PPO

## 2018-07-28 ENCOUNTER — Telehealth (HOSPITAL_COMMUNITY): Payer: Self-pay | Admitting: *Deleted

## 2018-07-28 NOTE — Telephone Encounter (Signed)
Pt called and updated about extended closure of cardiac rehab for four weeks d/t CoVID-19.  Tentative reopen date of 08/25/2018.  No answer. VM left for patient.  

## 2018-07-29 ENCOUNTER — Telehealth (HOSPITAL_COMMUNITY): Payer: Self-pay

## 2018-07-29 NOTE — Telephone Encounter (Signed)
Called pt for nutrition education and counseling on heart healthy diet, and to answer pt questions on bariatric weight loss1. Pt was not available at this time. Left voicemail requesting pt call dietitian back, distributed contact information.

## 2018-07-30 ENCOUNTER — Encounter (HOSPITAL_COMMUNITY): Payer: Commercial Managed Care - PPO

## 2018-07-30 ENCOUNTER — Ambulatory Visit: Payer: Commercial Managed Care - PPO | Admitting: Pulmonary Disease

## 2018-07-30 NOTE — Progress Notes (Signed)
Cardiac Individual Treatment Plan  Patient Details  Name: Meredith Diaz MRN: [id number] Date of Birth: Sep 05, 1980 Referring Provider:     CARDIAC REHAB PHASE II ORIENTATION from 06/03/2018 in [place]  Referring Provider  Dr. Aundra Dubin      Initial Encounter Date:    CARDIAC REHAB PHASE II ORIENTATION from 06/03/2018 in [place]  Date  06/03/18      Visit Diagnosis: 05/2018 Chronic combined systolic and diastolic CHF (congestive heart failure) ([place])  Patient's Home Medications on Admission:  Current Outpatient Medications:  .  albuterol (VENTOLIN HFA) 108 (90 Base) MCG/ACT inhaler, INHALE 1-2 PUFFS INTO THE LUNGS EVERY 6 (SIX) HOURS AS NEEDED FOR WHEEZING OR SHORTNESS OF BREATH. (Patient taking differently: Inhale 1-2 puffs into the lungs every 6 (six) hours as needed for wheezing or shortness of breath. INHALE 1-2 PUFFS INTO THE LUNGS EVERY 6 (SIX) HOURS AS NEEDED FOR WHEEZING OR SHORTNESS OF BREATH.), Disp: 18 g, Rfl: 5 .  budesonide-formoterol (SYMBICORT) 160-4.5 MCG/ACT inhaler, TAKE 2 PUFFS BY MOUTH TWICE A DAY (Patient taking differently: Inhale 2 puffs into the lungs 2 (two) times daily. ), Disp: 10.2 Inhaler, Rfl: 6 .  clobetasol cream (TEMOVATE) 8.27 %, Apply 1 application topically See admin instructions. Apply daily to affected areas of genital area as directed, Disp: , Rfl:  .  clonazePAM (KLONOPIN) 1 MG tablet, Take 1/2-1 tab, by mouth twice a day (Patient taking differently: Take 1 mg by mouth 2 (two) times daily. ), Disp: 60 tablet, Rfl: 5 .  ipratropium-albuterol (DUONEB) 0.5-2.5 (3) MG/3ML SOLN, Take 3 mLs by nebulization 2 (two) times daily as needed (For wheezing or shoortness of breath). (Patient taking differently: Take 3 mLs by nebulization 2 (two) times daily as needed (For wheezing or shortness of breath). ), Disp: 180 mL, Rfl: 5 .  levothyroxine (SYNTHROID, LEVOTHROID) 125 MCG tablet, Take 1 tablet  (125 mcg total) by mouth daily., Disp: 90 tablet, Rfl: 2 .  metoprolol succinate (TOPROL-XL) 25 MG 24 hr tablet, Take 1 tablet (25 mg total) by mouth at bedtime., Disp: 30 tablet, Rfl: 6 .  potassium chloride (K-DUR) 10 MEQ tablet, Take 2 tablets (20 mEq total) by mouth daily., Disp: 60 tablet, Rfl: 3 .  spironolactone (ALDACTONE) 25 MG tablet, Take 1 tablet (25 mg total) by mouth daily., Disp: 30 tablet, Rfl: 6 .  torsemide (DEMADEX) 20 MG tablet, TAKE 2 TABLETS BY MOUTH EVERY DAY, Disp: 180 tablet, Rfl: 3 .  valsartan (DIOVAN) 80 MG tablet, Take 1 tablet (80 mg total) by mouth daily., Disp: 60 tablet, Rfl: 6 .  vortioxetine HBr (TRINTELLIX) 10 MG TABS tablet, Take 1 tablet (10 mg total) by mouth daily. (Patient taking differently: Take 10 mg by mouth every evening. ), Disp: 30 tablet, Rfl: 2  Past Medical History: Past Medical History:  Diagnosis Date  . Anxiety   . Asthma   . Back pain   . Chronic combined systolic and diastolic CHF (congestive heart failure) ([place]) 07/2011   a. Prior Echo: EF was 25-30%; b. No Sig CAD on CATH; c. No significant finding on Cardiac MRI; d. follow-up Echo 2014 --  EF of 50%;  e. 03/2014 Echo: EF 40-45%, gr 1 DD; f. 11/2014 Echo: EF 40-45%, Gr 2 DD.  [name] Congenital heart disease, adult    a. 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  . Depression   . Dyspnea   . Essential hypertension   . Family history of breast cancer   . Family history of colon cancer   . Fibromyalgia   . Gallbladder problem   . Hypothyroidism   . IBS (irritable bowel syndrome)   . Morbid obesity ([place])   . Nonischemic cardiomyopathy ([place])    a. unclear etiology;  b. 01/2012 Cardiac MRI: no infiltrative pathology, EF 52%;  b. 11/2014 Echo: EF 40-45%, gr 2 DD.  [name] Scoliosis   . Sleep apnea   . Stomach problems   . Thyroid disease   . Vitamin B 12 deficiency   . Vitamin D  deficiency     Tobacco Use: Social History   Tobacco Use  Smoking Status Never Smoker  Smokeless Tobacco Never Used    Labs: Recent Review Flowsheet Data    Labs for ITP Cardiac and Pulmonary Rehab Latest Ref Rng & Units 06/15/2015 08/07/2017 04/16/2018 07/10/2018 07/10/2018   Cholestrol 100 - 199 mg/dL - 197 - - -   LDLCALC 0 - 99 mg/dL - 124(H) - - -   HDL >39 mg/dL - 49 - - -   Trlycerides 0 - 149 mg/dL - 122 - - -   Hemoglobin A1c 4.6 - 6.5 % 5.6 5.9(H) 6.4 - -   HCO3 20.0 - 28.0 mmol/L - - - 39.2(H) 37.3(H)   TCO2 22 - 32 mmol/L - - - 42(H) 40(H)   O2SAT % - - - 67.0 67.0      Capillary Blood Glucose: Lab Results  Component Value Date   GLUCAP 155 (H) 01/27/2015     Exercise Target Goals: Exercise Program Goal: Individual exercise prescription set using results from initial 6 min walk test and THRR while considering  patient's activity barriers and safety.   Exercise Prescription Goal: Initial exercise prescription builds to 30-45 minutes a day of aerobic activity, 2-3 days per week.  Home exercise guidelines will be given to patient during program as part of exercise prescription that the participant will acknowledge.  Activity Barriers & Risk Stratification: Activity Barriers & Cardiac Risk Stratification - 06/03/18 1154      Activity Barriers & Cardiac Risk Stratification   Activity Barriers  Joint Problems;Deconditioning;Muscular Weakness;Shortness of Breath;Other (comment)    Comments  O2 use. Hip pain.     Cardiac Risk Stratification  High       6 Minute Walk: 6 Minute Walk    Row Name 06/03/18 1152         6 Minute Walk   Phase  Initial     Distance  600 feet     Walk Time  6 minutes     # of Rest Breaks  1     MPH  1.1     METS  2.44     RPE  17     Perceived Dyspnea   3     VO2 Peak  8.55     Symptoms  Yes (comment)     Comments  SOB     Resting HR  84 bpm     Resting BP  104/60     Resting Oxygen Saturation   91 %     Exercise Oxygen  Saturation  during 6 min walk  85 %     Max Ex. HR  116 bpm     Max Ex. BP  104/60     2 Minute Post BP  108/64  Oxygen Initial Assessment:   Oxygen Re-Evaluation:   Oxygen Discharge (Final Oxygen Re-Evaluation):   Initial Exercise Prescription: Initial Exercise Prescription - 06/03/18 1200      Date of Initial Exercise RX and Referring Provider   Date  06/03/18    Referring Provider  Dr. Aundra Dubin    Expected Discharge Date  09/12/18      Oxygen   Oxygen  Continuous      NuStep   Level  2    SPM  75    Minutes  20    METs  2      Track   Laps  8    Minutes  10    METs  2.38      Prescription Details   Frequency (times per week)  3    Duration  Progress to 30 minutes of continuous aerobic without signs/symptoms of physical distress      Intensity   THRR 40-80% of Max Heartrate  73-146    Ratings of Perceived Exertion  11-13    Perceived Dyspnea  0-4      Progression   Progression  Continue to progress workloads to maintain intensity without signs/symptoms of physical distress.      Resistance Training   Training Prescription  Yes    Weight  2 lbs.     Reps  10-15       Perform Capillary Blood Glucose checks as needed.  Exercise Prescription Changes: Exercise Prescription Changes    Row Name 06/09/18 0900 06/23/18 1412 07/07/18 1139 07/18/18 1143       Response to Exercise   Blood Pressure (Admit)  120/80  106/70  106/78  112/70    Blood Pressure (Exercise)  124/70  110/70  120/70  132/70    Blood Pressure (Exit)  112/60  110/62  120/70  112/64    Heart Rate (Admit)  94 bpm  99 bpm  98 bpm  98 bpm    Heart Rate (Exercise)  119 bpm  115 bpm  120 bpm  125 bpm    Heart Rate (Exit)  85 bpm  99 bpm  98 bpm  107 bpm    Oxygen Saturation (Admit)  92 %  94 %  -  -    Oxygen Saturation (Exercise)  82 %  94 %  96 %  -    Oxygen Saturation (Exit)  89 %  95 %  -  -    Rating of Perceived Exertion (Exercise)  _0 Perceived Dyspnea  (Exercise)  _1 Symptoms  None  SOB  SOB  SOB    Comments  Pt oriented to exercise equipment   None  None  None    Duration  Progress to 30 minutes of  aerobic without signs/symptoms of physical distress  Progress to 30 minutes of  aerobic without signs/symptoms of physical distress  Progress to 30 minutes of  aerobic without signs/symptoms of physical distress  Progress to 30 minutes of  aerobic without signs/symptoms of physical distress    Intensity  THRR unchanged  THRR unchanged  THRR unchanged  THRR unchanged      Progression   Progression  Continue to progress workloads to maintain intensity without signs/symptoms of physical distress.  Continue to progress workloads to maintain intensity without signs/symptoms of physical distress.  Continue to progress workloads to maintain intensity without signs/symptoms of physical distress.  Continue to progress workloads to maintain intensity without signs/symptoms of physical distress.    Average METs  2.2  2.1  2.2  2.2      Resistance Training   Training Prescription  No  Yes  Yes  Yes    Weight  -  2lbs  2lbs  2lbs    Reps  -  10-15  10-15  10-15    Time  -  10 Minutes  10 Minutes  10 Minutes      Oxygen   Oxygen  Continuous  Continuous  Continuous  Continuous    Liters  -  -  4  4      NuStep   Level  _0 SPM  75  80  95  95    Minutes  _1 METs  2  2.2  2.4  2.4      Track   Laps  _2 Minutes  _3 METs  1  1.87  1.87  1.87       Exercise Comments: Exercise Comments    Row Name 06/09/18 0912 07/09/18 1031 07/24/18 1145 07/24/18 1159     Exercise Comments  Pt's first day of exercise. Pt was oriented to exercise equipment. Pt responded well to workloads. Will continue to monitor and encourage pt.   Pt is continuing to respond well to exercise workloads. Pt has needed a lot of emotional support from staff regrading health. Will follow up with pt regarding home exercise  program when pt is in better spirits and open to information.  Unable to review METs and Goals with pt.   Unable to review METs and Goals with pt. Pt is unable to exercise due to departmental closure per CDC recommendation to prevent COVID-19 spread.        Exercise Goals and Review: Exercise Goals    Row Name 06/03/18 1155             Exercise Goals   Increase Physical Activity  Yes       Intervention  Provide advice, education, support and counseling about physical activity/exercise needs.;Develop an individualized exercise prescription for aerobic and resistive training based on initial evaluation findings, risk stratification, comorbidities and participant's personal goals.       Expected Outcomes  Short Term: Attend rehab on a regular basis to increase amount of physical activity.       Increase Strength and Stamina  Yes       Intervention  Provide advice, education, support and counseling about physical activity/exercise needs.;Develop an individualized exercise prescription for aerobic and resistive training based on initial evaluation findings, risk stratification, comorbidities and participant's personal goals.       Expected Outcomes  Short Term: Increase workloads from initial exercise prescription for resistance, speed, and METs.       Able to understand and use rate of perceived exertion (RPE) scale  Yes       Intervention  Provide education and explanation on how to use RPE scale       Expected Outcomes  Short Term: Able to use RPE daily in rehab to express subjective intensity level;Long Term:  Able to use RPE to guide intensity level when exercising independently       Able to understand and use Dyspnea scale  Yes       Intervention  Provide education and explanation on how to use Dyspnea scale       Expected Outcomes  Short Term: Able to use Dyspnea scale daily in rehab to express subjective sense of shortness of breath during exertion;Long Term: Able to use Dyspnea scale to  guide intensity level when exercising independently       Knowledge and understanding of Target Heart Rate Range (THRR)  Yes       Intervention  Provide education and explanation of THRR including how the numbers were predicted and where they are located for reference       Expected Outcomes  Short Term: Able to state/look up THRR;Long Term: Able to use THRR to govern intensity when exercising independently;Short Term: Able to use daily as guideline for intensity in rehab       Able to check pulse independently  Yes       Intervention  Provide education and demonstration on how to check pulse in carotid and radial arteries.;Review the importance of being able to check your own pulse for safety during independent exercise       Expected Outcomes  Short Term: Able to explain why pulse checking is important during independent exercise;Long Term: Able to check pulse independently and accurately       Understanding of Exercise Prescription  Yes       Intervention  Provide education, explanation, and written materials on patient's individual exercise prescription       Expected Outcomes  Short Term: Able to explain program exercise prescription;Long Term: Able to explain home exercise prescription to exercise independently          Exercise Goals Re-Evaluation :   Discharge Exercise Prescription (Final Exercise Prescription Changes): Exercise Prescription Changes - 07/18/18 1143      Response to Exercise   Blood Pressure (Admit)  112/70    Blood Pressure (Exercise)  132/70    Blood Pressure (Exit)  112/64    Heart Rate (Admit)  98 bpm    Heart Rate (Exercise)  125 bpm    Heart Rate (Exit)  107 bpm    Rating of Perceived Exertion (Exercise)  14    Perceived Dyspnea (Exercise)  3    Symptoms  SOB    Comments  None    Duration  Progress to 30 minutes of  aerobic without signs/symptoms of physical distress    Intensity  THRR unchanged      Progression   Progression  Continue to progress  workloads to maintain intensity without signs/symptoms of physical distress.    Average METs  2.2      Resistance Training   Training Prescription  Yes    Weight  2lbs    Reps  10-15    Time  10 Minutes      Oxygen   Oxygen  Continuous    Liters  4      NuStep   Level  2    SPM  95    Minutes  20    METs  2.4      Track   Laps  5    Minutes  10    METs  1.87       Nutrition:  Target Goals: Understanding of nutrition guidelines, daily intake of sodium <1574m, cholesterol <2084m calories 30% from fat and 7% or less from saturated fats, daily to have 5 or more servings of fruits and vegetables.  Biometrics: Pre Biometrics -  06/03/18 1155      Pre Biometrics   Height  5' (1.524 m)    Weight  102.1 kg    Waist Circumference  47 inches    Hip Circumference  53.5 inches    Waist to Hip Ratio  0.88 %    BMI (Calculated)  43.96    Triceps Skinfold  25 mm    % Body Fat  50.5 %    Grip Strength  24 kg    Flexibility  0 in    Single Leg Stand  22.06 seconds        Nutrition Therapy Plan and Nutrition Goals: Nutrition Therapy & Goals - 06/03/18 1114      Nutrition Therapy   Diet  heart healthy, carb modified      Personal Nutrition Goals   Nutrition Goal  Pt to identify and limit food sources of saturated fat, trans fat, refined carbohydrates and sodium    Personal Goal #2  Pt to build a healthy plate including vegetables, fruits, whole grains, and low-fat dairy products in a heart healthy meal plan    Personal Goal #3  Pt able to name foods that affect blood glucose    Personal Goal #4  Pt to identify food quantities necessary to achieve weight loss of 6-24 lb at graduation from cardiac rehab.       Intervention Plan   Intervention  Prescribe, educate and counsel regarding individualized specific dietary modifications aiming towards targeted core components such as weight, hypertension, lipid management, diabetes, heart failure and other comorbidities.    Expected  Outcomes  Short Term Goal: Understand basic principles of dietary content, such as calories, fat, sodium, cholesterol and nutrients.;Long Term Goal: Adherence to prescribed nutrition plan.       Nutrition Assessments: Nutrition Assessments - 06/03/18 1115      MEDFICTS Scores   Pre Score  21       Nutrition Goals Re-Evaluation:   Nutrition Goals Re-Evaluation:   Nutrition Goals Discharge (Final Nutrition Goals Re-Evaluation):   Psychosocial: Target Goals: Acknowledge presence or absence of significant depression and/or stress, maximize coping skills, provide positive support system. Participant is able to verbalize types and ability to use techniques and skills needed for reducing stress and depression.  Initial Review & Psychosocial Screening: Initial Psych Review & Screening - 06/03/18 1126      Initial Review   Current issues with  Current Stress Concerns;History of Depression;Current Anxiety/Panic;Current Depression    Source of Stress Concerns  Chronic Illness;Unable to participate in former interests or hobbies;Unable to perform yard/household activities    Comments  declining health, self disappointment, feelings of failure and hopelessness      [place]?  Yes   husband, friends, therapist      Barriers   Psychosocial barriers to participate in program  The patient should benefit from training in stress management and relaxation.;Psychosocial barriers identified (see note)      Screening Interventions   Interventions  Encouraged to exercise;Provide feedback about the scores to participant    Expected Outcomes  Short Term goal: Utilizing psychosocial counselor, staff and physician to assist with identification of specific Stressors or current issues interfering with healing process. Setting desired goal for each stressor or current issue identified.;Long Term Goal: Stressors or current issues are controlled or eliminated.;Short Term goal:  Identification and review with participant of any Quality of Life or Depression concerns found by scoring the questionnaire.;Long Term goal: The participant  improves quality of Life and PHQ9 Scores as seen by post scores and/or verbalization of changes       Quality of Life Scores: Quality of Life - 06/03/18 1132      Quality of Life Scores   Health/Function Pre  8.97 %    Socioeconomic Pre  22 %    Psych/Spiritual Pre  11.5 %    Family Pre  21.4 %    GLOBAL Pre  14.24 %      Scores of 19 and below usually indicate a poorer quality of life in these areas.  A difference of  2-3 points is a clinically meaningful difference.  A difference of 2-3 points in the total score of the Quality of Life Index has been associated with significant improvement in overall quality of life, self-image, physical symptoms, and general health in studies assessing change in quality of life.  PHQ-9: Recent Review Flowsheet Data    Depression screen [facility] 2/9 06/09/2018 04/16/2018 08/07/2017   Decreased Interest _0 Down, Depressed, Hopeless _1 PHQ - 2 Score _2 Altered sleeping _3 Tired, decreased energy _4 Change in appetite _5 Feeling bad or failure about yourself  _6 Trouble concentrating _7 Moving slowly or fidgety/restless _8 Suicidal thoughts 0 0 0   PHQ-9 Score _9 Difficult doing work/chores Very difficult - Very difficult     Interpretation of Total Score  Total Score Depression Severity:  1-4 = Minimal depression, 5-9 = Mild depression, 10-14 = Moderate depression, 15-19 = Moderately severe depression, 20-27 = Severe depression   Psychosocial Evaluation and Intervention: Psychosocial Evaluation - 06/09/18 0841      Psychosocial Evaluation & Interventions   Interventions  Therapist referral;Stress management education;Encouraged to exercise with the program and follow exercise prescription;Relaxation education    Comments  Terrica has a negative  outlook.  She feels as if she is a failure for returning to cardiac rehab.  Georgann is not hopeful for any improvements in her health with participation in cardiac rehab. Ahlia is currently seeing a therapist.  Aquarius enjoys watching TV and spending time with her dogs.     Expected Outcomes  Armani will continue to utilize her resources to help with her outlook.  Emotional support given as needed.     Continue Psychosocial Services   Follow up required by counselor       Psychosocial Re-Evaluation: Psychosocial Re-Evaluation    [place] Name 06/12/18 1447 07/09/18 1639 07/23/18 1414         Psychosocial Re-Evaluation   Current issues with  Current Depression;Current Anxiety/Panic;Current Stress Concerns  Current Depression;Current Anxiety/Panic;Current Stress Concerns  Current Depression;Current Anxiety/Panic;Current Stress Concerns     Comments  Ivadell has a negative outlook.  She feels as if she is a failure for returning to cardiac rehab.  Blakelynn is not hopeful for any improvements in her health with participation in cardiac rehab. Janae is currently seeing a therapist.   Jacelyn continues to have a negative outlook.  She is anxious for her upcoming tests.  Emotional support given.   Gwendolen continues to have a negative outlook.  She is anxious about her breathing and subsequent tests.  Emotional support given.      Expected Outcomes  Bently will continue to utilize her resources to help  with her outlook.  Emotional support given as needed.   Caty will continue to utilize her resources to help with her outlook.  Emotional support given as needed.   Louvina will continue to utilize her resources to help with her outlook.  Emotional support given as needed.      Interventions  Therapist referral;Stress management education;Relaxation education;Encouraged to attend Cardiac Rehabilitation for the exercise  Therapist referral;Stress management education;Relaxation education;Encouraged to attend Cardiac Rehabilitation  for the exercise  Therapist referral;Stress management education;Relaxation education;Encouraged to attend Cardiac Rehabilitation for the exercise     Continue Psychosocial Services   Follow up required by counselor  Follow up required by counselor  Follow up required by counselor        Psychosocial Discharge (Final Psychosocial Re-Evaluation): Psychosocial Re-Evaluation - 07/23/18 1414      Psychosocial Re-Evaluation   Current issues with  Current Depression;Current Anxiety/Panic;Current Stress Concerns    Comments  Ma continues to have a negative outlook.  She is anxious about her breathing and subsequent tests.  Emotional support given.     Expected Outcomes  Elizebath will continue to utilize her resources to help with her outlook.  Emotional support given as needed.     Interventions  Therapist referral;Stress management education;Relaxation education;Encouraged to attend Cardiac Rehabilitation for the exercise    Continue Psychosocial Services   Follow up required by counselor       Vocational Rehabilitation: Provide vocational rehab assistance to qualifying candidates.   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 Participant will state understanding/return demonstration of topics presented.  Learning Barriers/Preferences: Learning Barriers/Preferences - 06/03/18 1156      Learning Barriers/Preferences   Learning Barriers  Sight;Exercise Concerns   Dizziness   Learning Preferences  Written Material;Skilled Demonstration       Education Topics: Count Your Pulse:  -Group instruction provided by verbal instruction, demonstration, patient participation and written materials to support subject.  Instructors  address importance of being able to find your pulse and how to count your pulse when at home without a heart monitor.  Patients get hands on experience counting their pulse with staff help and individually.   Heart Attack, Angina, and Risk Factor Modification:  -Group instruction provided by verbal instruction, video, and written materials to support subject.  Instructors address signs and symptoms of angina and heart attacks.    Also discuss risk factors for heart disease and how to make changes to improve heart health risk factors.   Functional Fitness:  -Group instruction provided by verbal instruction, demonstration, patient participation, and written materials to support subject.  Instructors address safety measures for doing things around the house.  Discuss how to get up and down off the floor, how to pick things up properly, how to safely get out of a chair without assistance, and balance training.   Meditation and Mindfulness:  -Group instruction provided by verbal instruction, patient participation, and written materials to support subject.  Instructor addresses importance of mindfulness and meditation practice to help reduce stress and improve awareness.  Instructor also leads participants through a meditation exercise.    Stretching for Flexibility and Mobility:  -Group instruction provided by verbal instruction, patient participation, and written materials to support subject.  Instructors lead participants through series of stretches that are designed to increase flexibility thus improving mobility.  These stretches are additional exercise for major muscle groups that are typically performed during regular warm up and cool down.   Hands Only CPR:  -Group verbal, video, and participation provides a basic overview of AHA guidelines for community CPR. Role-play of emergencies allow participants the opportunity to practice calling for help and chest compression technique with discussion  of AED use.   Hypertension: -Group verbal and written instruction that provides a basic overview of hypertension including the most recent diagnostic guidelines, risk factor reduction with self-care instructions and medication management.    Nutrition I class: Heart Healthy Eating:  -Group instruction provided by PowerPoint slides, verbal discussion, and written materials to support subject matter. The instructor gives an explanation and review of the Therapeutic Lifestyle Changes diet recommendations, which includes a discussion on lipid goals, dietary fat, sodium, fiber, plant stanol/sterol esters, sugar, and the components of a well-balanced, healthy diet.   Nutrition II class: Lifestyle Skills:  -Group instruction provided by PowerPoint slides, verbal discussion, and written materials to support subject matter. The instructor gives an explanation and review of label reading, grocery shopping for heart health, heart healthy recipe modifications, and ways to make healthier choices when eating out.   Diabetes Question & Answer:  -Group instruction provided by PowerPoint slides, verbal discussion, and written materials to support subject matter. The instructor gives an explanation and review of diabetes co-morbidities, pre- and post-prandial blood glucose goals, pre-exercise blood glucose goals, signs, symptoms, and treatment of hypoglycemia and hyperglycemia, and foot care basics.   Diabetes Blitz:  -Group instruction provided by PowerPoint slides, verbal discussion, and written materials to support subject matter. The instructor gives an explanation and review of the physiology behind type 1 and type 2 diabetes, diabetes medications and rational behind using different medications, pre- and post-prandial blood glucose recommendations and Hemoglobin A1c goals, diabetes diet, and exercise including blood glucose guidelines for exercising safely.    Portion Distortion:  -Group instruction  provided by PowerPoint slides, verbal discussion, written materials, and food models to support subject matter. The instructor gives an explanation of serving size versus portion size, changes in portions sizes over the last 20 years, and what consists of a serving from each food group.   Stress Management:  -Group instruction provided by verbal instruction, video, and written materials to support subject matter.  Instructors review role of stress in heart disease and how to cope with stress positively.     Exercising on Your Own:  -Group instruction provided by verbal instruction, power point, and written materials to support subject.  Instructors discuss benefits of exercise, components of exercise, frequency and intensity of exercise, and end points for exercise.  Also discuss use of nitroglycerin and activating EMS.  Review options of places to exercise outside of rehab.  Review guidelines for sex with heart disease.   Cardiac Drugs I:  -Group instruction provided by verbal instruction and written materials to support subject.  Instructor reviews cardiac drug classes: antiplatelets, anticoagulants, beta blockers, and statins.  Instructor discusses reasons, side effects, and lifestyle considerations for each drug class.   Cardiac Drugs II:  -Group instruction provided by verbal instruction and written materials to support subject.  Instructor reviews cardiac drug classes: angiotensin converting enzyme inhibitors (ACE-I), angiotensin II receptor blockers (ARBs), nitrates, and calcium channel blockers.  Instructor discusses reasons, side effects, and lifestyle considerations for each drug class.   Anatomy and  Physiology of the Circulatory System:  Group verbal and written instruction and models provide basic cardiac anatomy and physiology, with the coronary electrical and arterial systems. Review of: AMI, Angina, Valve disease, Heart Failure, Peripheral Artery Disease, Cardiac Arrhythmia,  Pacemakers, and the ICD.   Other Education:  -Group or individual verbal, written, or video instructions that support the educational goals of the cardiac rehab program.   Holiday Eating Survival Tips:  -Group instruction provided by PowerPoint slides, verbal discussion, and written materials to support subject matter. The instructor gives patients tips, tricks, and techniques to help them not only survive but enjoy the holidays despite the onslaught of food that accompanies the holidays.   Knowledge Questionnaire Score: Knowledge Questionnaire Score - 06/03/18 1134      Knowledge Questionnaire Score   Pre Score  23/24       Core Components/Risk Factors/Patient Goals at Admission: Personal Goals and Risk Factors at Admission - 06/03/18 1157      Core Components/Risk Factors/Patient Goals on Admission    Weight Management  Yes;Obesity;Weight Maintenance    Intervention  Weight Management: Develop a combined nutrition and exercise program designed to reach desired caloric intake, while maintaining appropriate intake of nutrient and fiber, sodium and fats, and appropriate energy expenditure required for the weight goal.;Weight Management: Provide education and appropriate resources to help participant work on and attain dietary goals.;Weight Management/Obesity: Establish reasonable short term and long term weight goals.;Obesity: Provide education and appropriate resources to help participant work on and attain dietary goals.    Admit Weight  225 lb 1.4 oz (102.1 kg)    Expected Outcomes  Short Term: Continue to assess and modify interventions until short term weight is achieved;Long Term: Adherence to nutrition and physical activity/exercise program aimed toward attainment of established weight goal;Weight Maintenance: Understanding of the daily nutrition guidelines, which includes 25-35% calories from fat, 7% or less cal from saturated fats, less than '200mg'$  cholesterol, less than 1.5gm of  sodium, & 5 or more servings of fruits and vegetables daily;Understanding recommendations for meals to include 15-35% energy as protein, 25-35% energy from fat, 35-60% energy from carbohydrates, less than '200mg'$  of dietary cholesterol, 20-35 gm of total fiber daily;Understanding of distribution of calorie intake throughout the day with the consumption of 4-5 meals/snacks;Weight Loss: Understanding of general recommendations for a balanced deficit meal plan, which promotes 1-2 lb weight loss per week and includes a negative energy balance of [phone] kcal/d    Improve shortness of breath with ADL's  Yes    Intervention  Provide education, individualized exercise plan and daily activity instruction to help decrease symptoms of SOB with activities of daily living.    Expected Outcomes  Short Term: Improve cardiorespiratory fitness to achieve a reduction of symptoms when performing ADLs;Long Term: Be able to perform more ADLs without symptoms or delay the onset of symptoms    Heart Failure  Yes    Intervention  Provide a combined exercise and nutrition program that is supplemented with education, support and counseling about heart failure. Directed toward relieving symptoms such as shortness of breath, decreased exercise tolerance, and extremity edema.    Expected Outcomes  Improve functional capacity of life    Hypertension  Yes    Intervention  Provide education on lifestyle modifcations including regular physical activity/exercise, weight management, moderate sodium restriction and increased consumption of fresh fruit, vegetables, and low fat dairy, alcohol moderation, and smoking cessation.;Monitor prescription use compliance.    Expected Outcomes  Short Term: Continued assessment  and intervention until BP is < 140/4m HG in hypertensive participants. < 130/871mHG in hypertensive participants with diabetes, heart failure or chronic kidney disease.;Long Term: Maintenance of blood pressure at goal levels.     Lipids  Yes    Intervention  Provide education and support for participant on nutrition & aerobic/resistive exercise along with prescribed medications to achieve LDL <7023mHDL >68m56m  Expected Outcomes  Short Term: Participant states understanding of desired cholesterol values and is compliant with medications prescribed. Participant is following exercise prescription and nutrition guidelines.;Long Term: Cholesterol controlled with medications as prescribed, with individualized exercise RX and with personalized nutrition plan. Value goals: LDL < 70mg71mL > 40 mg.    Stress  Yes    Intervention  Offer individual and/or small group education and counseling on adjustment to heart disease, stress management and health-related lifestyle change. Teach and support self-help strategies.;Refer participants experiencing significant psychosocial distress to appropriate mental health specialists for further evaluation and treatment. When possible, include family members and significant others in education/counseling sessions.    Expected Outcomes  Short Term: Participant demonstrates changes in health-related behavior, relaxation and other stress management skills, ability to obtain effective social support, and compliance with psychotropic medications if prescribed.;Long Term: Emotional wellbeing is indicated by absence of clinically significant psychosocial distress or social isolation.       Core Components/Risk Factors/Patient Goals Review:  Goals and Risk Factor Review    Row Name 06/09/18 0846 07/09/18 1638 07/23/18 1414         Core Components/Risk Factors/Patient Goals Review   Personal Goals Review  Weight Management/Obesity;Heart Failure;Lipids;Stress;Hypertension;Improve shortness of breath with ADL's  Weight Management/Obesity;Heart Failure;Lipids;Stress;Hypertension;Improve shortness of breath with ADL's  Weight Management/Obesity;Heart Failure;Lipids;Stress;Hypertension;Improve shortness of  breath with ADL's     Review  Pt with multiple CAD RFs participating in CR. EllisKrutid like to increase her activity level and feel comfortable with exercise.   EllisMarcellinainues to participate in CR.  She is increasing her MET levels despite feeling "like I can't breathe."  Plan for testing 07/10/2018.  Will follow along with patient.   Pt continues to tolerate exercise.  EllisChristnains anxious about breathing and subsequent testing.  Exercise currently on hold as department is closed per recommended guidelines from federal government to prevent spread of COVID-19.      Expected Outcomes  Pt will continue to participate in CR exercise, nutrition, and lifestyle modification opportunities.   Pt will continue to participate in CR exercise, nutrition, and lifestyle modification opportunities.   Pt will continue to participate in CR exercise, nutrition, and lifestyle modification opportunities.         Core Components/Risk Factors/Patient Goals at Discharge (Final Review):  Goals and Risk Factor Review - 07/23/18 1414      Core Components/Risk Factors/Patient Goals Review   Personal Goals Review  Weight Management/Obesity;Heart Failure;Lipids;Stress;Hypertension;Improve shortness of breath with ADL's    Review  Pt continues to tolerate exercise.  EllisIsabellamarieins anxious about breathing and subsequent testing.  Exercise currently on hold as department is closed per recommended guidelines from federal government to prevent spread of COVID-19.     Expected Outcomes  Pt will continue to participate in CR exercise, nutrition, and lifestyle modification opportunities.        ITP Comments: ITP Comments    Row Name 06/03/18 1126 06/09/18 0834 07/09/18 1637 07/23/18 1413     ITP Comments  Dr. TraciFransico Himical Director   30 Day ITP Review.  Pt started exercise today and tolerated it well.   30 Day ITP Review. Imanni continues to participate in CR.  She is increasing her MET levels despite feeling "like I can't  breathe."  Oxygen levels stable on 4L.  Plan for testing 07/10/2018 to evaluate breathing.  Will follow along with patient.   30 Day ITP Review.  Pt continues to tolerate exercise.  Pt remains anxious about breathing and subsequent testing. Exercise currently on hold as department is closed per recommended guidelines from federal government to prevent spread of COVID-19.        Comments: See ITP Comments.

## 2018-08-01 ENCOUNTER — Encounter (HOSPITAL_COMMUNITY): Payer: Commercial Managed Care - PPO

## 2018-08-04 ENCOUNTER — Encounter (HOSPITAL_COMMUNITY): Payer: Commercial Managed Care - PPO

## 2018-08-06 ENCOUNTER — Encounter (HOSPITAL_COMMUNITY): Payer: Commercial Managed Care - PPO

## 2018-08-08 ENCOUNTER — Encounter (HOSPITAL_COMMUNITY): Payer: Commercial Managed Care - PPO

## 2018-08-11 ENCOUNTER — Encounter (HOSPITAL_COMMUNITY): Payer: Commercial Managed Care - PPO

## 2018-08-12 DIAGNOSIS — J449 Chronic obstructive pulmonary disease, unspecified: Secondary | ICD-10-CM | POA: Diagnosis not present

## 2018-08-13 ENCOUNTER — Encounter (HOSPITAL_COMMUNITY): Payer: Commercial Managed Care - PPO

## 2018-08-13 ENCOUNTER — Telehealth (HOSPITAL_COMMUNITY): Payer: Self-pay | Admitting: Cardiac Rehabilitation

## 2018-08-13 NOTE — Telephone Encounter (Signed)
Pt phone call to advise of continued Outpatient  Cardiac Rehab departmental closure for COVID 19 precautions.  Unknown date for reopening. Pt advised to continue exercising on her own, following home exercise guidelines.  Pt instructed to notify MD PRN symptoms. Pt also advised to notify Dr. Kathlyn [place] office with questions about her recent heart cath.   Pt denies food insecurity at this time.    Pt offered emotional support and reassurance.  Pt expressed gratitude for the call and looking forward to returning to outpatient cardiac rehab.  Deveron Furlong, RN, BSN Cardiac Pulmonary Rehab

## 2018-08-14 DIAGNOSIS — G4733 Obstructive sleep apnea (adult) (pediatric): Secondary | ICD-10-CM | POA: Diagnosis not present

## 2018-08-15 ENCOUNTER — Encounter (HOSPITAL_COMMUNITY): Payer: Commercial Managed Care - PPO

## 2018-08-18 ENCOUNTER — Encounter (HOSPITAL_COMMUNITY): Payer: Commercial Managed Care - PPO

## 2018-08-20 ENCOUNTER — Encounter (HOSPITAL_COMMUNITY): Payer: Commercial Managed Care - PPO

## 2018-08-22 ENCOUNTER — Encounter (HOSPITAL_COMMUNITY): Payer: Commercial Managed Care - PPO

## 2018-08-25 ENCOUNTER — Encounter (HOSPITAL_COMMUNITY): Payer: Commercial Managed Care - PPO

## 2018-08-26 ENCOUNTER — Other Ambulatory Visit: Payer: Self-pay

## 2018-08-26 ENCOUNTER — Ambulatory Visit: Payer: Commercial Managed Care - PPO | Admitting: Pulmonary Disease

## 2018-08-26 ENCOUNTER — Ambulatory Visit (HOSPITAL_COMMUNITY)
Admission: RE | Admit: 2018-08-26 | Discharge: 2018-08-26 | Disposition: A | Discharge status: Home / Self Care | Payer: Commercial Managed Care - PPO | Source: Ambulatory Visit | Attending: Cardiology | Admitting: Cardiology

## 2018-08-26 ENCOUNTER — Telehealth (HOSPITAL_COMMUNITY): Payer: Self-pay

## 2018-08-26 DIAGNOSIS — I5042 Chronic combined systolic (congestive) and diastolic (congestive) heart failure: Secondary | ICD-10-CM

## 2018-08-26 MED ORDER — TORSEMIDE 20 MG PO TABS: 60.0000 mg | ORAL_TABLET | Freq: Two times a day (BID) | ORAL | 3 refills | Status: DC

## 2018-08-26 MED ORDER — POTASSIUM CHLORIDE ER 20 MEQ PO TBCR: 20.0000 meq | EXTENDED_RELEASE_TABLET | Freq: Two times a day (BID) | ORAL | 0 refills | Status: DC

## 2018-08-26 NOTE — Telephone Encounter (Signed)
Reviewed AVS with pt:  1. Increase torsemide to 60 mg bid. /completed 2. Increase KCl to 20 bid. /completed 3. BMET in the next couple days. 22 April @915  4. BMET 2 wks. 6 May @ 915 5. Followup telehealth 3 wks. /18 May @ 1:20

## 2018-08-26 NOTE — Progress Notes (Signed)
Heart Failure TeleHealth Note  Due to national recommendations of social distancing due to COVID 19, Audio/video telehealth visit is felt to be most appropriate for this patient at this time.  See MyChart message from today for patient consent regarding telehealth for [facility].  Date:  08/26/2018   ID:  Meredith Diaz, DOB Mar 16, 1981, MRN [id number]  Location: Home  Provider location: [facility] Type of Visit: Established patient   PCP:  Myrlene Broker, MD  Cardiologist:  Dr. Shirlee Latch  Chief Complaint: Shortness of breath   History of Present Illness: Meredith Diaz is a 38 y.o. female presents via audio/video conferencing for a telehealth visit today.     she denies symptoms worrisome for COVID 19.   Patient has a history of nonischemic cardiomyopathy.  This was diagnosed in 2013.  EF was initially 25-30%. Cardiac cath at that time showed no significant CAD.  Cardiac MRI showed no late gadoliniuim enhancement.  Since that time, EF has remained low but has improved a bit.  Most recent echo in 11/17 showed EF 45-50% with diffuse hypokinesis.  She also has asthma.  PFTs showed a severe restrictive defect that may be due to body habitus primarily.   She was admitted in 8/16 with chest pain.  CTA chest showed no PE, lung parenchyma looked normal.  She had ETT with poor exercise tolerance but no evidence for ischemia.  CPX was done in 12/16, showing moderate functional limitation due primarily to lung restriction.   Admitted 11/4 - 03/13/18 for increased DOE. Diuresed well with IV lasix and treated for asthma exacerbation. Treated with solumedrol and prednisone burst. Echo with EF 50-55%, mildly dilated/dysfunctional RV, septal bounce noted but IVC normal in size.   Due to increased dyspnea, RHC was done in 3/20.  Right and left heart filling pressures were elevated with near equalization of pressures suggesting restrictive cardiomyopathy.  No  evidence of pericardial constriction.  I increased her torsemide to 40 mg bid.   She continues to be short of breath.  Confined to house by coronavirus so not getting much exercise.  She does ok walking around the house with her oxygen on but is short of breath dressing and showering still.  Very short of breath if she takes off oxygen.  She is using CPAP at night and home oxygen at all times.  No lightheadedness or palpitations.  She feels like she is fluid overloaded. Weight is mildly higher.    Labs (9/16): K 3.2, creatinine 1.11, hgb 14.3 Labs (12/16): K 3.3, creatinine 0.96, BNP 23 Labs (2/17): K 4.7, creatinine 0.95, TSH normal Labs (8/17): K 3.9, creatinine 0.93 Labs (12/18): K 3.6, creatinine 0.82, pro-BNP 42, TSH normal Labs (12/19): K 4.4, creatinine 0.9 Labs (2/20): BNP 47, hgb 13.1, K 4.4, creatinine 0.8  PMH: 1. Chronic systolic CHF: Nonischemic cardiomyopathy.  - EF 25-30% by echo in 2013.  - Cardiac MRI (2013) with EF 52%, no delayed enhancement.  - LHC in 2013 without significant CAD. - 11/15 echo with EF 40-45%.   - 7/16 echo with EF 40-45%, grade II diastolic dysfunction.  - ETT (4/65) with poor exercise tolerance but no ischemia.  - CPX (12/16): peak VO2 15.7 (23.5 adjusted for ideal body weight), VE/VCO2 slope 21, RER 1.15 => moderate functional limitation thought to be primarily due to restrictive lung physiology.  - Echo (11/17): EF 45-50%, mild diffuse hypokinesis, normal RV size and systolic function.  - Echo (11/19):  EF 50-55%, mildly dilated and mildly dysfunctional RV with respirophasic septal bounce noted, normal IVC and PA pressure estimate.  - RHC (3/20): mean 19, PA 54/18 mean 38, mean PCWP 23, CI 2.77, PVR 2.79 WU. Equalization of pressures suggestive of restrictive cardiomyopathy, not suggestive of pericardial constriction.  2. Asthma: PFTs (4/16) with mild obstruction, severe restriction and severely decreased DLCO => think primarily due to body habitus.   3. Depression 4. Hypothyroidism 5. 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  6. Angioedema with ACEI 7. OHS/OSA: CPAP at night, oxygen during the day.  8. Chronic leukocytosis: Negative workup.  9. BPPV 10. Obesity   Current Outpatient Medications  Medication Sig Dispense Refill  . albuterol (VENTOLIN HFA) 108 (90 Base) MCG/ACT inhaler INHALE 1-2 PUFFS INTO THE LUNGS EVERY 6 (SIX) HOURS AS NEEDED FOR WHEEZING OR SHORTNESS OF BREATH. (Patient taking differently: Inhale 1-2 puffs into the lungs every 6 (six) hours as needed for wheezing or shortness of breath. INHALE 1-2 PUFFS INTO THE LUNGS EVERY 6 (SIX) HOURS AS NEEDED FOR WHEEZING OR SHORTNESS OF BREATH.) 18 g 5  . budesonide-formoterol (SYMBICORT) 160-4.5 MCG/ACT inhaler TAKE 2 PUFFS BY MOUTH TWICE A DAY (Patient taking differently: Inhale 2 puffs into the lungs 2 (two) times daily. ) 10.2 Inhaler 6  . clobetasol cream (TEMOVATE) 0.05 % Apply 1 application topically See admin instructions. Apply daily to affected areas of genital area as directed    . clonazePAM (KLONOPIN) 1 MG tablet Take 1/2-1 tab, by mouth twice a day (Patient taking differently: Take 1 mg by mouth 2 (two) times daily. ) 60 tablet 5  . ipratropium-albuterol (DUONEB) 0.5-2.5 (3) MG/3ML SOLN Take 3 mLs by nebulization 2 (two) times daily as needed (For wheezing or shoortness of breath). (Patient taking differently: Take 3 mLs by nebulization 2 (two) times daily as needed (For wheezing or shortness of breath). ) 180 mL 5  . levothyroxine (SYNTHROID, LEVOTHROID) 125 MCG tablet Take 1 tablet (125 mcg total) by mouth daily. 90 tablet 2  . metoprolol succinate (TOPROL-XL) 25 MG 24 hr tablet Take 1 tablet (25 mg total) by mouth at bedtime. 30 tablet 6  . potassium chloride 20 MEQ TBCR Take 20 mEq by mouth 2 (two) times daily. 180 tablet 0  . spironolactone (ALDACTONE) 25 MG tablet Take 1 tablet (25 mg total) by mouth daily. 30 tablet 6  . torsemide (DEMADEX) 20 MG  tablet Take 3 tablets (60 mg total) by mouth 2 (two) times daily. 180 tablet 3  . valsartan (DIOVAN) 80 MG tablet Take 1 tablet (80 mg total) by mouth daily. 60 tablet 6  . vortioxetine HBr (TRINTELLIX) 10 MG TABS tablet Take 1 tablet (10 mg total) by mouth daily. (Patient taking differently: Take 10 mg by mouth every evening. ) 30 tablet 2   No current facility-administered medications for this encounter.     Allergies:   Lisinopril   Social History:  The patient  reports that she has never smoked. She has never used smokeless tobacco. She reports that she does not drink alcohol or use drugs.   Family History:  The patient's family history includes Alcoholism in her mother; Asthma in her paternal grandmother; Breast cancer (age of onset: 6536) in her paternal grandmother; Breast cancer (age of onset: 4150) in her maternal aunt and paternal aunt; Colon cancer (age of onset: 7462) in her maternal grandmother; Depression in her mother; Heart attack in her maternal grandfather, maternal grandmother, paternal grandfather, and paternal grandmother; Heart  disease in her father, maternal grandfather, and paternal grandmother; Hypertension in her father.   ROS:  Please see the history of present illness.   All other systems are personally reviewed and negative.   Exam:  (Video/Tele Health Call; Exam is subjective and or/visual.) General:  Speaks in full sentences. No resp difficulty. Neck: JVP difficult Lungs: Normal respiratory effort with conversation.  Abdomen: Non-distended per patient report Extremities: No edema. Neuro: Alert & oriented x 3.   Recent Labs: 11/12/2017: ALT 30 03/13/2018: Magnesium 2.3 04/16/2018: Pro B Natriuretic peptide (BNP) 24.0; TSH 2.44 07/03/2018: B Natriuretic Peptide 46.9; BUN 16; Creatinine, Ser 0.80; Platelets 340 07/10/2018: Hemoglobin 12.9; Potassium 3.6; Sodium 140  Personally reviewed   Wt Readings from Last 3 Encounters:  07/10/18 99.8 kg (220 lb)  07/03/18 101.3 kg  (223 lb 6.4 oz)  06/10/18 103.4 kg (228 lb)     ASSESSMENT AND PLAN:  1. Chronic systolic CHF: Nonischemic cardiomyopathy.  No CAD on prior cath, prior cMRI showed no late gadolinium enhancement.  No family history of cardiomyopathy.  No ETOH/substance abuse.  No uncontrolled HTN.  Possible that she had viral myocarditis. Last echo in  03/11/18 showed LVEF up to 50-55% but with mildly dilated/dysfunctional RV, IVC appeared normal and PA pressure estimation was normal, septal bounce was noted.  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  No ventricular interdependence to suggest pericardial constriction. Still quite short of breath, NYHA class IIIb.  Does not feel like she has diuresed much with torsemide up to 40 mg bid.  - Increase torsemide to 60 mg bid.  Arrange BMET this week and again in 10 days. .  - Continue Toprol XL 25 mg daily.  - Continue spironolactone 25 daily.   - Continue valsartan 80 mg daily.   - Restart cardiac rehab after coronavirus restrictions limited.  2. Asthma: No wheezing on exam.  3. OHS/OSA: She uses CPAP at night and oxygen during the day.  OHS is likely due to lung restriction from obesity and scoliosis.  I think OHS/OSA plays a large role in her dyspnea.  4. Obesity: Weight loss is imperative.  Reasonable to look into bariatric surgery.   COVID screen The patient does not have any symptoms that suggest any further testing/ screening at this time.  Social distancing reinforced today.  Patient Risk: After full review of this patients clinical status, I feel that they are at moderate risk for cardiac decompensation at this time.  Relevant cardiac medications were reviewed at length with the patient today. The patient does not have concerns regarding their medications at this time.   Recommended follow-up:  2-3 weeks via telehealth  Today, I have spent 23 minutes with  the patient with telehealth technology discussing the above issues .    Signed, Marca Ancona, MD  08/26/2018 10:10 PM  [facility] [address] [facility] [place] [address] [phone] (office) [phone] (fax)

## 2018-08-26 NOTE — Patient Instructions (Addendum)
1. Increase torsemide to 60 mg bid. /completed 2. Increase KCl to 20 bid. /completed 3. BMET in the next couple days. 22 April @915  4. BMET 2 wks. 6 May @ 915 5. Followup telehealth 3 wks. /18 May @ 1:20

## 2018-08-27 ENCOUNTER — Other Ambulatory Visit: Payer: Self-pay

## 2018-08-27 ENCOUNTER — Encounter (HOSPITAL_COMMUNITY): Payer: Commercial Managed Care - PPO

## 2018-08-27 ENCOUNTER — Ambulatory Visit (HOSPITAL_COMMUNITY)
Admission: RE | Admit: 2018-08-27 | Discharge: 2018-08-27 | Disposition: A | Discharge status: Home / Self Care | Payer: Commercial Managed Care - PPO | Source: Ambulatory Visit | Attending: Cardiology | Admitting: Cardiology

## 2018-08-27 DIAGNOSIS — I5042 Chronic combined systolic (congestive) and diastolic (congestive) heart failure: Secondary | ICD-10-CM | POA: Insufficient documentation

## 2018-08-27 LAB — BASIC METABOLIC PANEL
Anion gap: 10 (ref 5–15)
BUN: 14 mg/dL (ref 6–20)
CO2: 33 mmol/L — ABNORMAL HIGH (ref 22–32)
Calcium: 9 mg/dL (ref 8.9–10.3)
Chloride: 96 mmol/L — ABNORMAL LOW (ref 98–111)
Creatinine, Ser: 0.85 mg/dL (ref 0.44–1.00)
GFR calc Af Amer: >60 mL/min (ref >60)
GFR calc non Af Amer: >60 mL/min (ref >60)
Glucose, Bld: 132 mg/dL — ABNORMAL HIGH (ref 70–99)
Potassium: 4 mmol/L (ref 3.5–5.1)
Sodium: 139 mmol/L (ref 135–145)

## 2018-08-29 ENCOUNTER — Encounter (HOSPITAL_COMMUNITY): Payer: Commercial Managed Care - PPO

## 2018-09-01 ENCOUNTER — Encounter (HOSPITAL_COMMUNITY): Payer: Commercial Managed Care - PPO

## 2018-09-01 ENCOUNTER — Other Ambulatory Visit: Payer: Self-pay | Admitting: Internal Medicine

## 2018-09-01 ENCOUNTER — Encounter (HOSPITAL_COMMUNITY): Payer: Commercial Managed Care - PPO | Admitting: Cardiology

## 2018-09-03 ENCOUNTER — Encounter (HOSPITAL_COMMUNITY): Payer: Commercial Managed Care - PPO

## 2018-09-05 ENCOUNTER — Encounter (HOSPITAL_COMMUNITY): Payer: Commercial Managed Care - PPO

## 2018-09-08 ENCOUNTER — Encounter (HOSPITAL_COMMUNITY): Payer: Commercial Managed Care - PPO

## 2018-09-10 ENCOUNTER — Encounter (HOSPITAL_COMMUNITY): Payer: Commercial Managed Care - PPO

## 2018-09-10 ENCOUNTER — Ambulatory Visit (HOSPITAL_COMMUNITY)
Admission: RE | Admit: 2018-09-10 | Discharge: 2018-09-10 | Disposition: A | Discharge status: Home / Self Care | Payer: Commercial Managed Care - PPO | Source: Ambulatory Visit | Attending: Internal Medicine | Admitting: Internal Medicine

## 2018-09-10 ENCOUNTER — Other Ambulatory Visit: Payer: Self-pay

## 2018-09-10 DIAGNOSIS — I5042 Chronic combined systolic (congestive) and diastolic (congestive) heart failure: Secondary | ICD-10-CM | POA: Insufficient documentation

## 2018-09-10 LAB — BASIC METABOLIC PANEL
Anion gap: 8 (ref 5–15)
BUN: 16 mg/dL (ref 6–20)
CO2: 34 mmol/L — ABNORMAL HIGH (ref 22–32)
Calcium: 9 mg/dL (ref 8.9–10.3)
Chloride: 97 mmol/L — ABNORMAL LOW (ref 98–111)
Creatinine, Ser: 1.01 mg/dL — ABNORMAL HIGH (ref 0.44–1.00)
GFR calc Af Amer: >60 mL/min (ref >60)
GFR calc non Af Amer: >60 mL/min (ref >60)
Glucose, Bld: 162 mg/dL — ABNORMAL HIGH (ref 70–99)
Potassium: 4.2 mmol/L (ref 3.5–5.1)
Sodium: 139 mmol/L (ref 135–145)

## 2018-09-11 DIAGNOSIS — J449 Chronic obstructive pulmonary disease, unspecified: Secondary | ICD-10-CM | POA: Diagnosis not present

## 2018-09-16 DIAGNOSIS — G4733 Obstructive sleep apnea (adult) (pediatric): Secondary | ICD-10-CM | POA: Diagnosis not present

## 2018-09-22 ENCOUNTER — Telehealth (HOSPITAL_COMMUNITY): Payer: Commercial Managed Care - PPO | Admitting: Cardiology

## 2018-09-23 ENCOUNTER — Ambulatory Visit (HOSPITAL_COMMUNITY)
Admission: RE | Admit: 2018-09-23 | Discharge: 2018-09-23 | Disposition: A | Discharge status: Home / Self Care | Payer: Commercial Managed Care - PPO | Source: Ambulatory Visit | Attending: Cardiology | Admitting: Cardiology

## 2018-09-23 ENCOUNTER — Other Ambulatory Visit: Payer: Self-pay

## 2018-09-23 ENCOUNTER — Encounter (HOSPITAL_COMMUNITY): Payer: Self-pay | Admitting: *Deleted

## 2018-09-23 DIAGNOSIS — I5042 Chronic combined systolic (congestive) and diastolic (congestive) heart failure: Secondary | ICD-10-CM

## 2018-09-23 DIAGNOSIS — J849 Interstitial pulmonary disease, unspecified: Secondary | ICD-10-CM

## 2018-09-23 NOTE — Progress Notes (Signed)
Laurey Morale, MD  Noralee Space, RN        1. Home REDS vest reading.  2. High resolution CT chest (noncontrast): Assess for interstitial lung disease  3. Followup in 3 months.    Orders placed, AVS sent via mychart

## 2018-09-23 NOTE — Progress Notes (Signed)
Heart Failure TeleHealth Note  Due to national recommendations of social distancing due to COVID 19, Audio/video telehealth visit is felt to be most appropriate for this patient at this time.  See MyChart message from today for patient consent regarding telehealth for [facility].  Date:  09/23/2018   ID:  Meredith Diaz, DOB 09/18/1980, MRN [id number]  Location: Home  Provider location: [place] [facility] Type of Visit: Established patient   PCP:  Myrlene Brokerrawford, Elizabeth A, MD  Cardiologist:  Dr. Shirlee LatchMcLean  Chief Complaint: Shortness of breath   History of Present Illness: Meredith Freestonellis Gallion Risko is a 38 y.o. female presents via audio/video conferencing for a telehealth visit today.     she denies symptoms worrisome for COVID 19.   Patient has a history of nonischemic cardiomyopathy.  This was diagnosed in 2013.  EF was initially 25-30%. Cardiac cath at that time showed no significant CAD.  Cardiac MRI showed no late gadoliniuim enhancement.  Since that time, EF has remained low but has improved a bit.  Most recent echo in 11/17 showed EF 45-50% with diffuse hypokinesis.  She also has asthma.  PFTs showed a severe restrictive defect that may be due to body habitus primarily.   She was admitted in 8/16 with chest pain.  CTA chest showed no PE, lung parenchyma looked normal.  She had ETT with poor exercise tolerance but no evidence for ischemia.  CPX was done in 12/16, showing moderate functional limitation due primarily to lung restriction.   Admitted 11/4 - 03/13/18 for increased DOE. Diuresed well with IV lasix and treated for asthma exacerbation. Treated with solumedrol and prednisone burst. Echo with EF 50-55%, mildly dilated/dysfunctional RV, septal bounce noted but IVC normal in size.   Due to increased dyspnea, RHC was done in 3/20.  Right and left heart filling pressures were elevated with near equalization of pressures suggesting restrictive cardiomyopathy.  No  evidence of pericardial constriction.  I increased her torsemide to 40 mg bid.   After last appointment, I increased her torsemide to 60 mg bid.  Weight is down maybe 1-2 lbs.  She is still short of breath with moderate exertion, no change.  No chest pain.  No lightheadedness.  No palpitations.  She is trying to be more active but hard with coronavirus restrictions.    Labs (9/16): K 3.2, creatinine 1.11, hgb 14.3 Labs (12/16): K 3.3, creatinine 0.96, BNP 23 Labs (2/17): K 4.7, creatinine 0.95, TSH normal Labs (8/17): K 3.9, creatinine 0.93 Labs (12/18): K 3.6, creatinine 0.82, pro-BNP 42, TSH normal Labs (12/19): K 4.4, creatinine 0.9 Labs (2/20): BNP 47, hgb 13.1, K 4.4, creatinine 0.8 Labs (5/20): K 4.2, creatinine 1.01  PMH: 1. Chronic systolic CHF: Nonischemic cardiomyopathy.  - EF 25-30% by echo in 2013.  - Cardiac MRI (2013) with EF 52%, no delayed enhancement.  - LHC in 2013 without significant CAD. - 11/15 echo with EF 40-45%.   - 7/16 echo with EF 40-45%, grade II diastolic dysfunction.  - ETT (4/098/16) with poor exercise tolerance but no ischemia.  - CPX (12/16): peak VO2 15.7 (23.5 adjusted for ideal body weight), VE/VCO2 slope 21, RER 1.15 => moderate functional limitation thought to be primarily due to restrictive lung physiology.  - Echo (11/17): EF 45-50%, mild diffuse hypokinesis, normal RV size and systolic function.  - Echo (11/19): EF 50-55%, mildly dilated and mildly dysfunctional RV with respirophasic septal bounce noted, normal IVC and PA pressure estimate.  -  RHC (3/20): mean 19, PA 54/18 mean 38, mean PCWP 23, CI 2.77, PVR 2.79 WU. Equalization of pressures suggestive of restrictive cardiomyopathy, not suggestive of pericardial constriction.  2. Asthma: PFTs (4/16) with mild obstruction, severe restriction and severely decreased DLCO => think primarily due to body habitus.  3. Depression 4. Hypothyroidism 5. 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   6. Angioedema with ACEI 7. OHS/OSA: CPAP at night, oxygen during the day.  8. Chronic leukocytosis: Negative workup.  9. BPPV 10. Obesity   Current Outpatient Medications  Medication Sig Dispense Refill  . albuterol (VENTOLIN HFA) 108 (90 Base) MCG/ACT inhaler INHALE 1-2 PUFFS INTO THE LUNGS EVERY 6 (SIX) HOURS AS NEEDED FOR WHEEZING OR SHORTNESS OF BREATH. (Patient taking differently: Inhale 1-2 puffs into the lungs every 6 (six) hours as needed for wheezing or shortness of breath. INHALE 1-2 PUFFS INTO THE LUNGS EVERY 6 (SIX) HOURS AS NEEDED FOR WHEEZING OR SHORTNESS OF BREATH.) 18 g 5  . budesonide-formoterol (SYMBICORT) 160-4.5 MCG/ACT inhaler TAKE 2 PUFFS BY MOUTH TWICE A DAY (Patient taking differently: Inhale 2 puffs into the lungs 2 (two) times daily. ) 10.2 Inhaler 6  . clobetasol cream (TEMOVATE) 0.05 % Apply 1 application topically See admin instructions. Apply daily to affected areas of genital area as directed    . clonazePAM (KLONOPIN) 1 MG tablet Take 1/2-1 tab, by mouth twice a day (Patient taking differently: Take 1 mg by mouth 2 (two) times daily. ) 60 tablet 5  . ipratropium-albuterol (DUONEB) 0.5-2.5 (3) MG/3ML SOLN Take 3 mLs by nebulization 2 (two) times daily as needed (For wheezing or shoortness of breath). (Patient taking differently: Take 3 mLs by nebulization 2 (two) times daily as needed (For wheezing or shortness of breath). ) 180 mL 5  . levothyroxine (SYNTHROID, LEVOTHROID) 125 MCG tablet Take 1 tablet (125 mcg total) by mouth daily. 90 tablet 2  . metoprolol succinate (TOPROL-XL) 25 MG 24 hr tablet Take 1 tablet (25 mg total) by mouth at bedtime. 30 tablet 6  . potassium chloride 20 MEQ TBCR Take 20 mEq by mouth 2 (two) times daily. 180 tablet 0  . spironolactone (ALDACTONE) 25 MG tablet Take 1 tablet (25 mg total) by mouth daily. 30 tablet 6  . torsemide (DEMADEX) 20 MG tablet Take 3 tablets (60 mg total) by mouth 2 (two) times daily. 180 tablet 3  . TRINTELLIX  10 MG TABS tablet TAKE 1 TABLET BY MOUTH EVERY DAY 30 tablet 2  . valsartan (DIOVAN) 80 MG tablet Take 1 tablet (80 mg total) by mouth daily. 60 tablet 6   No current facility-administered medications for this encounter.     Allergies:   Lisinopril   Social History:  The patient  reports that she has never smoked. She has never used smokeless tobacco. She reports that she does not drink alcohol or use drugs.   Family History:  The patient's family history includes Alcoholism in her mother; Asthma in her paternal grandmother; Breast cancer (age of onset: 38) in her paternal grandmother; Breast cancer (age of onset: 81) in her maternal aunt and paternal aunt; Colon cancer (age of onset: 56) in her maternal grandmother; Depression in her mother; Heart attack in her maternal grandfather, maternal grandmother, paternal grandfather, and paternal grandmother; Heart disease in her father, maternal grandfather, and paternal grandmother; Hypertension in her father.   ROS:  Please see the history of present illness.   All other systems are personally reviewed and negative.  Exam:  (Video/Tele Health Call; Exam is subjective and or/visual.) General: NAD Lungs: normal respiratory effect with speaking.  Neck: Thick, no definite JVD.  Abdomen: non-distended Extremities: No edema.  Neuro Alert/oriented x 3.   Recent Labs: 11/12/2017: ALT 30 03/13/2018: Magnesium 2.3 04/16/2018: Pro B Natriuretic peptide (BNP) 24.0; TSH 2.44 07/03/2018: B Natriuretic Peptide 46.9; Platelets 340 07/10/2018: Hemoglobin 12.9 09/10/2018: BUN 16; Creatinine, Ser 1.01; Potassium 4.2; Sodium 139  Personally reviewed   Wt Readings from Last 3 Encounters:  07/10/18 99.8 kg (220 lb)  07/03/18 101.3 kg (223 lb 6.4 oz)  06/10/18 103.4 kg (228 lb)     ASSESSMENT AND PLAN:  1. Chronic systolic CHF: Nonischemic cardiomyopathy.  No CAD on prior cath, prior cMRI showed no late gadolinium enhancement.  No family history of  cardiomyopathy.  No ETOH/substance abuse.  No uncontrolled HTN.  Possible that she had viral myocarditis. Last echo in  03/11/18 showed LVEF up to 50-55% but with mildly dilated/dysfunctional RV, IVC appeared normal and PA pressure estimation was normal, septal bounce was noted.  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  No ventricular interdependence to suggest pericardial constriction. Still quite short of breath, NYHA class III.  Does not feel much different with torsemide up to 60 mg bid.  - Continue torsemide 60 mg bid. I will arrange home health to come by her house to measure REDS vest.  - Continue Toprol XL 25 mg daily.  - Continue spironolactone 25 daily.   - Continue valsartan 80 mg daily.   - Restart cardiac rehab after coronavirus restrictions limited.  - Given ongoing dyspnea without much improvement from increased diuresis, I will arrange for high resolution lung CT to assess for interstitial lung disease.  2. Asthma: No wheezing on exam.  3. OHS/OSA: She uses CPAP at night and oxygen during the day.  OHS is likely due to lung restriction from obesity and scoliosis.  I think OHS/OSA plays a large role in her dyspnea.  - I think it would be reasonable to get a pulmonary followup.  4. Obesity: Weight loss is imperative.  Reasonable to look into bariatric surgery.   COVID screen The patient does not have any symptoms that suggest any further testing/ screening at this time.  Social distancing reinforced today.  Patient Risk: After full review of this patients clinical status, I feel that they are at moderate risk for cardiac decompensation at this time.  Relevant cardiac medications were reviewed at length with the patient today. The patient does not have concerns regarding their medications at this time.   Recommended follow-up: 3 months.   Today, I have spent 18 minutes with the  patient with telehealth technology discussing the above issues .    Signed, Marca Ancona, MD  09/23/2018  [facility] [place] [address] [facility] [place] [address] [phone] (office) [phone] (fax)

## 2018-09-23 NOTE — Patient Instructions (Signed)
Non-Cardiac CT scanning, (CAT scanning), is a noninvasive, special x-ray that produces cross-sectional images of the body using x-rays and a computer. CT scans help physicians diagnose and treat medical conditions. For some CT exams, a contrast material is used to enhance visibility in the area of the body being studied. CT scans provide greater clarity and reveal more details than regular x-ray exams.  ONCE APPROVED WITH YOU INSURANCE WE WILL CALL YOU TO SCHEDULE THIS.  You have been referred to Eunice Blase, RN with [facility] for ReDS clip reading, she will call you and set up a time she can come to your home for this and will explain it to you.  Your physician recommends that you schedule a follow-up appointment in: 3 months, we will call you to schedule this appointment.

## 2018-09-24 NOTE — Progress Notes (Signed)
Meredith Diaz        DOB: 1980/07/29  Purpose of Visit: VS, ReDS HF provider: Shirlee Latch  Medications: Is the patient taking all medications listed on MAR from Epic? Yes List any medications that are not being taken correctly: n/a  List any medication refills needed: n/a  Is the patient able to pick up medications? Yes  Vitals: BP: 100/84    HR:87    Oxygen: 94%RA but does normally wear 2L/O2 aat.   Weight: 223lbs "few days ago"        Physical Exam:  Lung sounds:  Peripheral edema: states she feels puffy.    Wounds: no  Location:  Any patient concerns? Her shob w/exertion.  States she knows she should be doing more to build endurance and will try to as some simple home exercises were demonstrated to her and she said they were ones she could tolerate.   ReDS Vest/Clip Reading: 32%  Findings reported to Meredith Sheldon, NP w/HF clinic who didn't give any change in meds or new orders other than having her try and lose weight and to wear her CPAP while sleeping.  Meredith Diaz states she does wear her CPAP now after being hospitalized and given education on the reasons it is important to wear it. She said she didn't previously realize how not wearing it could affected her heart function.  After being informed, she said she now uses it nightly.  She agreed that she could do more exercising at home and says she will be better about weighing daily.  Rhythm Strip: n/a  Is Home Health recommended? No If yes, state reason:   Meredith Fillers, RN 09/24/18

## 2018-09-30 ENCOUNTER — Other Ambulatory Visit: Payer: Self-pay | Admitting: Pulmonary Disease

## 2018-09-30 ENCOUNTER — Telehealth (HOSPITAL_COMMUNITY): Payer: Self-pay | Admitting: *Deleted

## 2018-09-30 NOTE — Telephone Encounter (Signed)
PC to patient regarding virtual cardiac rehab option while face to face cardiac rehab is closed.  Explained program to patient and pt expresses interest in joining program. Virtual Cardiac Rehab will be set up with patient.  

## 2018-10-01 ENCOUNTER — Ambulatory Visit (HOSPITAL_COMMUNITY)
Admission: RE | Admit: 2018-10-01 | Discharge: 2018-10-01 | Disposition: A | Discharge status: Home / Self Care | Payer: Commercial Managed Care - PPO | Source: Ambulatory Visit | Attending: Cardiology | Admitting: Cardiology

## 2018-10-01 ENCOUNTER — Other Ambulatory Visit: Payer: Self-pay

## 2018-10-01 ENCOUNTER — Encounter (HOSPITAL_COMMUNITY): Payer: Self-pay

## 2018-10-01 DIAGNOSIS — J849 Interstitial pulmonary disease, unspecified: Secondary | ICD-10-CM | POA: Insufficient documentation

## 2018-10-01 NOTE — Progress Notes (Signed)
Notification of transitioning to virtual cardiac rehab  Dr.  Shirlee Latch   As you are aware our department remains closed to patients due to Covid-19.  We are excited to be able to offer an alternative to traditional onsite Cardiac Rehab while your patient continues to follow Re-Open guidelines.  This is a notification that your patient has been contacted and is very interested in participating in Virtual Cardiac Rehab.  Thank you for your continued support in helping [place] meet the health care needs of our patients.  Tempie Donning. Support Rep II   Cardiac Rehab staff

## 2018-10-02 ENCOUNTER — Telehealth (HOSPITAL_COMMUNITY): Payer: Self-pay

## 2018-10-02 DIAGNOSIS — R0609 Other forms of dyspnea: Secondary | ICD-10-CM

## 2018-10-02 DIAGNOSIS — I5032 Chronic diastolic (congestive) heart failure: Secondary | ICD-10-CM

## 2018-10-02 NOTE — Telephone Encounter (Signed)
-----   Message from Laurey Morale, MD sent at 10/01/2018 10:48 PM EDT ----- No interstitial lung disease.  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  I think it would be helpful for her to see pulmonary, Dr. Marchelle Gearing.

## 2018-10-02 NOTE — Telephone Encounter (Signed)
Pt returned call, results of CT scan provided. Advised of recommendations to see pulmonologist Dr Marchelle Gearing. Referral placed, advised to call office if she does not receive a call. Pt appreciative

## 2018-10-06 ENCOUNTER — Encounter (HOSPITAL_COMMUNITY): Payer: Self-pay | Admitting: *Deleted

## 2018-10-06 ENCOUNTER — Telehealth (HOSPITAL_COMMUNITY): Payer: Self-pay

## 2018-10-06 ENCOUNTER — Encounter (HOSPITAL_COMMUNITY)
Admission: RE | Admit: 2018-10-06 | Discharge: 2018-10-06 | Disposition: A | Discharge status: Home / Self Care | Payer: Commercial Managed Care - PPO | Source: Ambulatory Visit | Attending: Cardiology | Admitting: Cardiology

## 2018-10-06 DIAGNOSIS — I5042 Chronic combined systolic (congestive) and diastolic (congestive) heart failure: Secondary | ICD-10-CM

## 2018-10-06 NOTE — Telephone Encounter (Signed)
° °        Confirm Consent - In the setting of the current Covid19 crisis, you are scheduled for a phone visit with your Cardiac or Pulmonary team member on date.  Just as we do with many in-gym visits, in order for you to participate in this visit, we must obtain consent.  If you'd like, I can send this to your mychart (if signed up) or email for you to review.  Otherwise, I can obtain your verbal consent now.  By agreeing to a telephone visit, we'd like you to understand that the technology does not allow for your Cardiac or Pulmonary Rehab team member to perform a physical assessment, and thus may limit their ability to fully assess your ability to perform exercise programs. If your provider identifies any concerns that need to be evaluated in person, we will make arrangements to do so.  Finally, though the technology is pretty good, we cannot assure that it will always work on either your or our end and we cannot ensure that we have a secure connection.  Cardiac and Pulmonary Rehab Telehealth visits and At Home cardiac and pulmonary rehab are provided at no cost to you.               Are you willing to proceed?" STAFF: Did the patient verbally acknowledge consent to telehealth visit? Document YES/NO here: Yes      Jessica C.   Cardiac and Pulmonary Rehab Staff   D6/05/2018 T9:35AM

## 2018-10-06 NOTE — Progress Notes (Signed)
Called and spoke to pt regarding Virtual Cardiac Rehab.  Pt  was able to download the [employer] app on their smart device with no issues. Pt set up their account and received the following welcome message -"Welcome to the [facility] Cardiac and Pulmonary Rehabilitation program. We hope that you will find the exercise program beneficial in your recovery process. Our staff is available to assist with in questions/concerns about your exercise routine. Best wishes". Brief orientation provided to with the advisement to watch the "Intro to Rehab" series located under the Resource tab. Pt verbalized understanding. Will continue to follow and monitor pt progress with feedback as needed.Gladstone Lighter, RN,BSN 10/06/2018 12:55 PM

## 2018-10-12 ENCOUNTER — Other Ambulatory Visit (HOSPITAL_COMMUNITY): Payer: Self-pay | Admitting: Student

## 2018-10-20 ENCOUNTER — Ambulatory Visit
Admission: RE | Admit: 2018-10-20 | Discharge: 2018-10-20 | Disposition: A | Discharge status: Home / Self Care | Payer: Commercial Managed Care - PPO | Source: Ambulatory Visit | Attending: Obstetrics & Gynecology | Admitting: Obstetrics & Gynecology

## 2018-10-20 ENCOUNTER — Other Ambulatory Visit: Payer: Self-pay

## 2018-10-20 ENCOUNTER — Other Ambulatory Visit: Payer: Self-pay | Admitting: Pulmonary Disease

## 2018-10-20 ENCOUNTER — Other Ambulatory Visit: Payer: Self-pay | Admitting: Obstetrics & Gynecology

## 2018-10-20 ENCOUNTER — Ambulatory Visit: Payer: Commercial Managed Care - PPO

## 2018-10-20 DIAGNOSIS — N6320 Unspecified lump in the left breast, unspecified quadrant: Secondary | ICD-10-CM

## 2018-10-29 ENCOUNTER — Other Ambulatory Visit: Payer: Self-pay | Admitting: Pulmonary Disease

## 2018-10-29 NOTE — Telephone Encounter (Signed)
Is this appropriate for refill ? 

## 2018-10-29 NOTE — Telephone Encounter (Signed)
Attempted to call pt to let her know the med had been refilled this once but further refills needed to be handled by PCP Dr. Sharlet Salina but unable to reach. Left detailed message for pt letting her know this info. Nothing further needed.

## 2018-10-29 NOTE — Telephone Encounter (Signed)
I will refill once, anxiety should be managed by PCP now that Dr. Lenna Gilford has retired. Advised she follow up with Dr. Sharlet Salina.

## 2018-10-31 ENCOUNTER — Encounter (HOSPITAL_COMMUNITY)
Admission: RE | Admit: 2018-10-31 | Discharge: 2018-10-31 | Disposition: A | Discharge status: Home / Self Care | Payer: Commercial Managed Care - PPO | Source: Ambulatory Visit | Attending: Cardiology | Admitting: Cardiology

## 2018-10-31 ENCOUNTER — Telehealth (HOSPITAL_COMMUNITY): Payer: Self-pay | Admitting: *Deleted

## 2018-10-31 NOTE — Telephone Encounter (Signed)
Discussed with patient about returning to face to face cardiac rehab.  Pt would like to be phased in once program is up and running.  Instructed pt of need for masks and screening process when she returns.  Pt verbalized understanding. Will f/u with patient.

## 2018-11-12 ENCOUNTER — Other Ambulatory Visit (HOSPITAL_COMMUNITY): Payer: Self-pay

## 2018-11-12 DIAGNOSIS — I5042 Chronic combined systolic (congestive) and diastolic (congestive) heart failure: Secondary | ICD-10-CM

## 2018-11-12 MED ORDER — METOPROLOL SUCCINATE ER 25 MG PO TB24: 25.0000 mg | ORAL_TABLET | Freq: Every day | ORAL | 6 refills | Status: DC

## 2018-11-15 ENCOUNTER — Other Ambulatory Visit: Payer: Self-pay | Admitting: Pulmonary Disease

## 2018-11-24 ENCOUNTER — Other Ambulatory Visit: Payer: Self-pay | Admitting: Internal Medicine

## 2018-12-10 ENCOUNTER — Other Ambulatory Visit: Payer: Self-pay

## 2018-12-10 MED ORDER — VORTIOXETINE HBR 10 MG PO TABS: 10.0000 mg | ORAL_TABLET | Freq: Every day | ORAL | 1 refills | Status: DC

## 2018-12-18 ENCOUNTER — Encounter: Payer: Self-pay | Admitting: Internal Medicine

## 2018-12-18 ENCOUNTER — Ambulatory Visit (INDEPENDENT_AMBULATORY_CARE_PROVIDER_SITE_OTHER): Payer: Commercial Managed Care - PPO | Admitting: Internal Medicine

## 2018-12-18 ENCOUNTER — Other Ambulatory Visit: Payer: Self-pay

## 2018-12-18 DIAGNOSIS — Z20822 Contact with and (suspected) exposure to covid-19: Secondary | ICD-10-CM

## 2018-12-18 DIAGNOSIS — J019 Acute sinusitis, unspecified: Secondary | ICD-10-CM | POA: Diagnosis not present

## 2018-12-18 DIAGNOSIS — R0602 Shortness of breath: Secondary | ICD-10-CM | POA: Diagnosis not present

## 2018-12-18 MED ORDER — AMOXICILLIN-POT CLAVULANATE 875-125 MG PO TABS: 1.0000 | ORAL_TABLET | Freq: Two times a day (BID) | ORAL | 0 refills | Status: DC

## 2018-12-18 NOTE — Assessment & Plan Note (Signed)
She is overall moderate to high risk for complications and needs testing to rule out covid-19. She may have sinus infection and treating this with augmentin until results return. Advised on need for quarantine until results return.

## 2018-12-18 NOTE — Progress Notes (Signed)
Virtual Visit via Video Note  I connected with Meredith Diaz on 12/18/18 at  9:40 AM EDT by a video enabled telemedicine application and verified that I am speaking with the correct person using two identifiers.  The patient and the provider were at separate locations throughout the entire encounter.   I discussed the limitations of evaluation and management by telemedicine and the availability of in person appointments. The patient expressed understanding and agreed to proceed.  History of Present Illness: The patient is a 38 y.o. female with visit for acute sinus problems. Started about 1 week ago. She is having sinus pressure, some eye allergy symptoms. She is having slightly more SOB and using inhaler about the same. She is not exercising as much and feels that this may have to do with SOB. Denies cough. Denies ear pain. She has had sinus infection before and this feels similar. Given that she is high risk with other conditions she is concerned about covid-19. Has been doing sinus rinse and medications otc. Denies fevers or chills or muscle aches. Overall it is not improving. Has tried sinus rinses  Observations/Objective: Appearance: normal, breathing appears normal, no coughing during visit, casual grooming, abdomen does not appear distended, throat not visualized well, memory normal, mental status is A and O times 3  Assessment and Plan: See problem oriented charting  Follow Up Instructions: covid-19 testing, treat presumptively for sinus infection with augmentin in the meantime, advised to isolate until results return  Visit time 25 minutes: greater than 50% of that time was spent in face to face counseling and coordination of care with the patient: counseled about covid-19 and sinus symptoms as well as treatment options and natural course as well as need to quarantine until covid-19 results return  I discussed the assessment and treatment plan with the patient. The patient was  provided an opportunity to ask questions and all were answered. The patient agreed with the plan and demonstrated an understanding of the instructions.   The patient was advised to call back or seek an in-person evaluation if the symptoms worsen or if the condition fails to improve as anticipated.  Hoyt Koch, MD

## 2018-12-19 LAB — NOVEL CORONAVIRUS, NAA: SARS-CoV-2, NAA: NOT DETECTED

## 2018-12-29 ENCOUNTER — Ambulatory Visit (HOSPITAL_COMMUNITY)
Admission: RE | Admit: 2018-12-29 | Discharge: 2018-12-29 | Disposition: A | Discharge status: Home / Self Care | Payer: Commercial Managed Care - PPO | Source: Ambulatory Visit | Attending: Cardiology | Admitting: Cardiology

## 2018-12-29 ENCOUNTER — Other Ambulatory Visit: Payer: Self-pay

## 2018-12-29 ENCOUNTER — Encounter (HOSPITAL_COMMUNITY): Payer: Self-pay | Admitting: Cardiology

## 2018-12-29 VITALS — BP 110/79 | HR 90 | Wt 226.6 lb

## 2018-12-29 DIAGNOSIS — Z7951 Long term (current) use of inhaled steroids: Secondary | ICD-10-CM | POA: Insufficient documentation

## 2018-12-29 DIAGNOSIS — I5042 Chronic combined systolic (congestive) and diastolic (congestive) heart failure: Secondary | ICD-10-CM

## 2018-12-29 DIAGNOSIS — Z803 Family history of malignant neoplasm of breast: Secondary | ICD-10-CM | POA: Diagnosis not present

## 2018-12-29 DIAGNOSIS — M419 Scoliosis, unspecified: Secondary | ICD-10-CM | POA: Insufficient documentation

## 2018-12-29 DIAGNOSIS — Z888 Allergy status to other drugs, medicaments and biological substances status: Secondary | ICD-10-CM | POA: Insufficient documentation

## 2018-12-29 DIAGNOSIS — Z79890 Hormone replacement therapy (postmenopausal): Secondary | ICD-10-CM | POA: Insufficient documentation

## 2018-12-29 DIAGNOSIS — G4734 Idiopathic sleep related nonobstructive alveolar hypoventilation: Secondary | ICD-10-CM

## 2018-12-29 DIAGNOSIS — E039 Hypothyroidism, unspecified: Secondary | ICD-10-CM | POA: Insufficient documentation

## 2018-12-29 DIAGNOSIS — Z800 Family history of malignant neoplasm of digestive organs: Secondary | ICD-10-CM | POA: Diagnosis not present

## 2018-12-29 DIAGNOSIS — E662 Morbid (severe) obesity with alveolar hypoventilation: Secondary | ICD-10-CM | POA: Diagnosis not present

## 2018-12-29 DIAGNOSIS — Z79899 Other long term (current) drug therapy: Secondary | ICD-10-CM | POA: Diagnosis not present

## 2018-12-29 DIAGNOSIS — I5022 Chronic systolic (congestive) heart failure: Secondary | ICD-10-CM | POA: Diagnosis not present

## 2018-12-29 DIAGNOSIS — J45909 Unspecified asthma, uncomplicated: Secondary | ICD-10-CM | POA: Insufficient documentation

## 2018-12-29 DIAGNOSIS — Z8249 Family history of ischemic heart disease and other diseases of the circulatory system: Secondary | ICD-10-CM | POA: Diagnosis not present

## 2018-12-29 DIAGNOSIS — R0609 Other forms of dyspnea: Secondary | ICD-10-CM

## 2018-12-29 DIAGNOSIS — Z6841 Body Mass Index (BMI) 40.0 and over, adult: Secondary | ICD-10-CM | POA: Insufficient documentation

## 2018-12-29 DIAGNOSIS — F329 Major depressive disorder, single episode, unspecified: Secondary | ICD-10-CM | POA: Insufficient documentation

## 2018-12-29 DIAGNOSIS — I428 Other cardiomyopathies: Secondary | ICD-10-CM | POA: Insufficient documentation

## 2018-12-29 DIAGNOSIS — E669 Obesity, unspecified: Secondary | ICD-10-CM

## 2018-12-29 LAB — BASIC METABOLIC PANEL
Anion gap: 11 (ref 5–15)
BUN: 13 mg/dL (ref 6–20)
CO2: 31 mmol/L (ref 22–32)
Calcium: 9 mg/dL (ref 8.9–10.3)
Chloride: 94 mmol/L — ABNORMAL LOW (ref 98–111)
Creatinine, Ser: 0.81 mg/dL (ref 0.44–1.00)
GFR calc Af Amer: >60 mL/min (ref >60)
GFR calc non Af Amer: >60 mL/min (ref >60)
Glucose, Bld: 123 mg/dL — ABNORMAL HIGH (ref 70–99)
Potassium: 4 mmol/L (ref 3.5–5.1)
Sodium: 136 mmol/L (ref 135–145)

## 2018-12-29 MED ORDER — TORSEMIDE 20 MG PO TABS: ORAL_TABLET | ORAL | 3 refills | Status: DC

## 2018-12-29 NOTE — Progress Notes (Signed)
Date:  12/29/2018   ID:  Meredith Diaz, DOB [birth year], MRN [id number]  Provider location: [facility] Type of Visit: Established patient   PCP:  Meredith Broker, MD  Cardiologist:  Dr. Shirlee Latch  Chief Complaint: Shortness of breath   History of Present Illness: Meredith Diaz is a 38 y.o. female who has a history of nonischemic cardiomyopathy.  This was diagnosed in 2013.  EF was initially 25-30%. Cardiac cath at that time showed no significant CAD.  Cardiac MRI showed no late gadoliniuim enhancement.  Since that time, EF has remained low but has improved a bit.  Most recent echo in 11/17 showed EF 45-50% with diffuse hypokinesis.  She also has asthma.  PFTs showed a severe restrictive defect that may be due to body habitus primarily.   She was admitted in 8/16 with chest pain.  CTA chest showed no PE, lung parenchyma looked normal.  She had ETT with poor exercise tolerance but no evidence for ischemia.  CPX was done in 12/16, showing moderate functional limitation due primarily to lung restriction.   Admitted 11/4 - 03/13/18 for increased DOE. Diuresed well with IV lasix and treated for asthma exacerbation. Treated with solumedrol and prednisone burst. Echo with EF 50-55%, mildly dilated/dysfunctional RV, septal bounce noted but IVC normal in size.   Due to increased dyspnea, RHC was done in 3/20.  Right and left heart filling pressures were elevated with near equalization of pressures suggesting restrictive cardiomyopathy.  No evidence of pericardial constriction.  I increased her torsemide to 40 mg bid.   She returns for followup of CHF.  Weight is up 3 lbs since last office appt.  She tried the [employer] weight loss program but got frustrated and stopped.  She finds it hard to work out at home and wants to go back to her gym, but it is still closed due to coronavirus restrictions.  She is using oxygen during the day and CPAP at night.  She is  short of breath after walking 50-100 feet.  No PND.  Some heaviness in her chest when she lies flat, has to lie on her side.  She is short of breath with stairs.    Labs (9/16): K 3.2, creatinine 1.11, hgb 14.3 Labs (12/16): K 3.3, creatinine 0.96, BNP 23 Labs (2/17): K 4.7, creatinine 0.95, TSH normal Labs (8/17): K 3.9, creatinine 0.93 Labs (12/18): K 3.6, creatinine 0.82, pro-BNP 42, TSH normal Labs (12/19): K 4.4, creatinine 0.9 Labs (2/20): BNP 47, hgb 13.1, K 4.4, creatinine 0.8 Labs (5/20): K 4.2, creatinine 1.01  PMH: 1. Chronic systolic CHF: Nonischemic cardiomyopathy.  - EF 25-30% by echo in 2013.  - Cardiac MRI (2013) with EF 52%, no delayed enhancement.  - LHC in 2013 without significant CAD. - 11/15 echo with EF 40-45%.   - 7/16 echo with EF 40-45%, grade II diastolic dysfunction.  - ETT (3/74) with poor exercise tolerance but no ischemia.  - CPX (12/16): peak VO2 15.7 (23.5 adjusted for ideal body weight), VE/VCO2 slope 21, RER 1.15 => moderate functional limitation thought to be primarily due to restrictive lung physiology.  - Echo (11/17): EF 45-50%, mild diffuse hypokinesis, normal RV size and systolic function.  - Echo (11/19): EF 50-55%, mildly dilated and mildly dysfunctional RV with respirophasic septal bounce noted, normal IVC and PA pressure estimate.  - RHC (3/20): mean 19, PA 54/18 mean 38, mean PCWP 23, CI 2.77, PVR 2.79 WU. Equalization of  pressures suggestive of restrictive cardiomyopathy, not suggestive of pericardial constriction.  2. Asthma: PFTs (4/16) with mild obstruction, severe restriction and severely decreased DLCO => think primarily due to body habitus.  3. Depression 4. Hypothyroidism 5. 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  6. Angioedema with ACEI 7. OHS/OSA: CPAP at night, oxygen during the day.  8. Chronic leukocytosis: Negative workup.  9. BPPV 10. Obesity  11. Scoliosis: CT chest (5/20) showed no ILD, severe scoliosis.      Current Outpatient Medications  Medication Sig Dispense Refill   albuterol (VENTOLIN HFA) 108 (90 Base) MCG/ACT inhaler INHALE 1-2 PUFFS INTO THE LUNGS EVERY 6 (SIX) HOURS AS NEEDED FOR WHEEZING OR SHORTNESS OF BREATH. 18 Inhaler 3   amoxicillin-clavulanate (AUGMENTIN) 875-125 MG tablet Take 1 tablet by mouth 2 (two) times daily. 20 tablet 0   budesonide-formoterol (SYMBICORT) 160-4.5 MCG/ACT inhaler TAKE 2 PUFFS BY MOUTH TWICE A DAY 10.2 Inhaler 1   clobetasol cream (TEMOVATE) 0.05 % Apply 1 application topically See admin instructions. Apply daily to affected areas of genital area as directed     clonazePAM (KLONOPIN) 1 MG tablet TAKE 0.5-1 TABLET BY MOUTH TWICE DAILY 60 tablet 0   ipratropium-albuterol (DUONEB) 0.5-2.5 (3) MG/3ML SOLN Take 3 mLs by nebulization 2 (two) times daily as needed (For wheezing or shoortness of breath). (Patient taking differently: Take 3 mLs by nebulization 2 (two) times daily as needed (For wheezing or shortness of breath). ) 180 mL 5   levothyroxine (SYNTHROID, LEVOTHROID) 125 MCG tablet Take 1 tablet (125 mcg total) by mouth daily. 90 tablet 2   metoprolol succinate (TOPROL-XL) 25 MG 24 hr tablet Take 1 tablet (25 mg total) by mouth at bedtime. 30 tablet 6   potassium chloride 20 MEQ TBCR Take 20 mEq by mouth 2 (two) times daily. 180 tablet 0   spironolactone (ALDACTONE) 25 MG tablet TAKE 1 TABLET BY MOUTH EVERY DAY 30 tablet 11   torsemide (DEMADEX) 20 MG tablet Take 4 tablets (80 mg total) by mouth every morning AND 3 tablets (60 mg total) at bedtime. 630 tablet 3   valsartan (DIOVAN) 80 MG tablet Take 1 tablet (80 mg total) by mouth daily. 60 tablet 6   vortioxetine HBr (TRINTELLIX) 10 MG TABS tablet Take 1 tablet (10 mg total) by mouth daily. 90 tablet 1   No current facility-administered medications for this encounter.     Allergies:   Lisinopril   Social History:  The patient  reports that she has never smoked. She has never used  smokeless tobacco. She reports that she does not drink alcohol or use drugs.   Family History:  The patient's family history includes Alcoholism in her mother; Asthma in her paternal grandmother; Breast cancer (age of onset: 1136) in her paternal grandmother; Breast cancer (age of onset: 4650) in her maternal aunt and paternal aunt; Colon cancer (age of onset: 1462) in her maternal grandmother; Depression in her mother; Heart attack in her maternal grandfather, maternal grandmother, paternal grandfather, and paternal grandmother; Heart disease in her father, maternal grandfather, and paternal grandmother; Hypertension in her father.   ROS:  Please see the history of present illness.   All other systems are personally reviewed and negative.   Exam:   BP 110/79    Pulse 90    Wt 102.8 kg (226 lb 9.6 oz)    SpO2 92%    BMI 44.25 kg/m  General: NAD, obese Neck: Thick, JVP difficult, no thyromegaly or thyroid nodule.  Lungs: Clear to auscultation bilaterally with normal respiratory effort. CV: Nondisplaced PMI.  Heart regular S1/S2, no S3/S4, no murmur.  No peripheral edema.  No carotid bruit.  Normal pedal pulses.  Abdomen: Soft, nontender, no hepatosplenomegaly, no distention.  Skin: Intact without lesions or rashes.  Neurologic: Alert and oriented x 3.  Psych: Normal affect. Extremities: No clubbing or cyanosis.  HEENT: Normal.    Recent Labs: 03/13/2018: Magnesium 2.3 04/16/2018: Pro B Natriuretic peptide (BNP) 24.0; TSH 2.44 07/03/2018: B Natriuretic Peptide 46.9; Platelets 340 07/10/2018: Hemoglobin 12.9 12/29/2018: BUN 13; Creatinine, Ser 0.81; Potassium 4.0; Sodium 136  Personally reviewed   Wt Readings from Last 3 Encounters:  12/29/18 102.8 kg (226 lb 9.6 oz)  09/24/18 101.2 kg (223 lb)  07/10/18 99.8 kg (220 lb)     ASSESSMENT AND PLAN:  1. Chronic systolic CHF: Nonischemic cardiomyopathy.  No CAD on prior cath, prior cMRI showed no late gadolinium enhancement.  No family history of  cardiomyopathy.  No ETOH/substance abuse.  No uncontrolled HTN.  Possible that she had viral myocarditis. Last echo in  03/11/18 showed LVEF up to 50-55% but with mildly dilated/dysfunctional RV, IVC appeared normal and PA pressure estimation was normal, septal bounce was noted.  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  No ventricular interdependence to suggest pericardial constriction. Still NYHA class III symptoms with orthopnea.  Exam is difficult for volume. - Increase torsemide to 80 qam/60 qpm. BMET today and in 10 days.  - Continue Toprol XL 25 mg daily.  - Continue spironolactone 25 daily.   - Continue valsartan 80 mg daily.   - Restart cardiac rehab after coronavirus restrictions limited. I recommended that she consider getting a treadmill to use at home.  2. Asthma: No wheezing on exam.  3. OHS/OSA: She uses CPAP at night and oxygen during the day.  OHS is likely due to lung restriction from obesity and scoliosis, CT chest in 5/20 showed no ILD but did show severe scoliosis.  I think OHS/OSA plays a large role in her dyspnea.  - I would like her to followup with pulmonary, will refer.  4. Obesity: Weight loss is imperative.  Reasonable to look into bariatric surgery => I will refer her to [facility] to look into this.   Followup in 3 months.   Signed, Loralie Champagne, MD  12/29/2018  [place] [address] [facility] [place] [address] [phone] (office) [phone] (fax)

## 2018-12-29 NOTE — Patient Instructions (Addendum)
Labs were done today. We will call you with any ABNORMAL results. No news is good news!  Follow up with repeat lab work in 10 days.   INCREASE Torsemide to 80 mg (4 tabs) in the AM and 60 mg (3 tabs) in the PM each day.   You have been referred to Dr. Greer Pickerel at [facility]. This office will call to schedule an appointment with you.  You have been referred to Dr. Chase Caller for a Pulmonary consult.   Your physician recommends that you schedule a follow-up appointment in: 3 months.   At the [facility], you and your health needs are our priority. As part of our continuing mission to provide you with exceptional heart care, we have created designated Provider Care Teams. These Care Teams include your primary Cardiologist (physician) and Advanced Practice Providers (APPs- Physician Assistants and Nurse Practitioners) who all work together to provide you with the care you need, when you need it.   You may see any of the following providers on your designated Care Team at your next follow up: [name] Dr Glori Bickers . Dr Loralie Champagne . Darrick Grinder, NP   Please be sure to bring in all your medications bottles to every appointment.

## 2019-01-07 NOTE — Addendum Note (Signed)
Encounter addended by: Sol Passer on: 01/07/2019 2:46 PM  Actions taken: Vitals modified, Flowsheet data copied forward, Clinical Note Signed

## 2019-01-07 NOTE — Progress Notes (Addendum)
Discharge Progress Report  Patient Details  Name: Meredith Diaz MRN: [id number] Date of Birth: June 29, 1980 Referring Provider:     CARDIAC REHAB PHASE II ORIENTATION from 06/03/2018 in [facility]  Referring Provider  Dr. Aundra Dubin       Number of Visits: 14 sessions completed on-site at cardiac rehab. Patient transitioned to virtual cardiac rehab via [facility]. Patient is active on app.  Reason for Discharge:  Patient independent in their exercise.  Smoking History:  Social History   Tobacco Use  Smoking Status Never Smoker  Smokeless Tobacco Never Used    Diagnosis:  05/2018 Chronic combined systolic and diastolic CHF (congestive heart failure) (HCC)  ADL UCSD:   Initial Exercise Prescription:   Discharge Exercise Prescription (Final Exercise Prescription Changes): Exercise Prescription Changes - 07/18/18 1143      Response to Exercise   Blood Pressure (Admit)  112/70    Blood Pressure (Exercise)  132/70    Blood Pressure (Exit)  112/64    Heart Rate (Admit)  98 bpm    Heart Rate (Exercise)  125 bpm    Heart Rate (Exit)  107 bpm    Rating of Perceived Exertion (Exercise)  14    Perceived Dyspnea (Exercise)  3    Symptoms  SOB    Comments  None    Duration  Progress to 30 minutes of  aerobic without signs/symptoms of physical distress    Intensity  THRR unchanged      Progression   Progression  Continue to progress workloads to maintain intensity without signs/symptoms of physical distress.    Average METs  2.2      Resistance Training   Training Prescription  Yes    Weight  2lbs    Reps  10-15    Time  10 Minutes      Oxygen   Oxygen  Continuous    Liters  4      NuStep   Level  2    SPM  95    Minutes  20    METs  2.4      Track   Laps  5    Minutes  10    METs  1.87       Functional Capacity:   Psychological, QOL, Others - Outcomes: PHQ 2/9: Depression screen [facility] 2/9 06/09/2018 04/16/2018 08/07/2017   Decreased Interest 3 3 2   Down, Depressed, Hopeless 3 3 3   PHQ - 2 Score 6 6 5   Altered sleeping 2 2 2   Tired, decreased energy 3 3 3   Change in appetite 2 2 3   Feeling bad or failure about yourself  3 3 3   Trouble concentrating 2 2 2   Moving slowly or fidgety/restless 2 2 2   Suicidal thoughts 0 0 0  PHQ-9 Score 20 20 20   Difficult doing work/chores Very difficult - Very difficult  Some recent data might be hidden    Quality of Life:   Personal Goals: Goals established at orientation with interventions provided to work toward goal.    Personal Goals Discharge: Goals and Risk Factor Review    Row Name 07/23/18 1414             Core Components/Risk Factors/Patient Goals Review   Personal Goals Review  Weight Management/Obesity;Heart Failure;Lipids;Stress;Hypertension;Improve shortness of breath with ADL's       Review  Pt continues to tolerate exercise.  Meredith Diaz remains anxious about breathing and subsequent testing.  Exercise currently on hold  as department is closed per recommended guidelines from federal government to prevent spread of COVID-19.        Expected Outcomes  Pt will continue to participate in CR exercise, nutrition, and lifestyle modification opportunities.           Exercise Goals and Review:   Exercise Goals Re-Evaluation:   Nutrition & Weight - Outcomes:  Post Biometrics - 07/18/18 0659       Post  Biometrics   Weight  102.1 kg    BMI (Calculated)  43.96       Nutrition:   Nutrition Discharge:   Education Questionnaire Score:  Unable to complete post assessment due to department closure secondary to COVID-19 pandemic.

## 2019-01-08 ENCOUNTER — Ambulatory Visit (HOSPITAL_COMMUNITY)
Admission: RE | Admit: 2019-01-08 | Discharge: 2019-01-08 | Disposition: A | Discharge status: Home / Self Care | Payer: Commercial Managed Care - PPO | Source: Ambulatory Visit | Attending: Internal Medicine | Admitting: Internal Medicine

## 2019-01-08 ENCOUNTER — Other Ambulatory Visit: Payer: Self-pay

## 2019-01-08 DIAGNOSIS — I5042 Chronic combined systolic (congestive) and diastolic (congestive) heart failure: Secondary | ICD-10-CM | POA: Diagnosis not present

## 2019-01-08 LAB — BASIC METABOLIC PANEL
Anion gap: 12 (ref 5–15)
BUN: 18 mg/dL (ref 6–20)
CO2: 30 mmol/L (ref 22–32)
Calcium: 9 mg/dL (ref 8.9–10.3)
Chloride: 96 mmol/L — ABNORMAL LOW (ref 98–111)
Creatinine, Ser: 0.85 mg/dL (ref 0.44–1.00)
GFR calc Af Amer: >60 mL/min (ref >60)
GFR calc non Af Amer: >60 mL/min (ref >60)
Glucose, Bld: 120 mg/dL — ABNORMAL HIGH (ref 70–99)
Potassium: 3.8 mmol/L (ref 3.5–5.1)
Sodium: 138 mmol/L (ref 135–145)

## 2019-01-09 ENCOUNTER — Other Ambulatory Visit: Payer: Self-pay | Admitting: Pulmonary Disease

## 2019-01-15 ENCOUNTER — Other Ambulatory Visit: Payer: Self-pay | Admitting: Pulmonary Disease

## 2019-01-16 ENCOUNTER — Telehealth: Payer: Self-pay | Admitting: Pulmonary Disease

## 2019-01-16 NOTE — Telephone Encounter (Signed)
Called and spoke with pt. Pt states she spoke with her cardiologist, Dr. Aundra Dubin, recently and he stated he recommends pt to be established with Dr. Chase Caller for her pulmonary HTN. Pt is requesting to switch to Dr. Chase Caller as her primary pulmonologist. I let her know we would get in touch with the providers and get back with her. Pt expressed understanding.   AO, please advise if you would be okay with pt switching to MR. Thank you.

## 2019-01-17 NOTE — Telephone Encounter (Signed)
Okay with me 

## 2019-01-19 NOTE — Telephone Encounter (Signed)
MR please advise if you're ok to take on this patient.  This is at the suggestion of Dr. Aundra Dubin.  Thanks!

## 2019-01-19 NOTE — Telephone Encounter (Signed)
Called patient to schedule an OV with MR, he has availabilities on 9/17 and 9/18 but pt is out of town those days.  MR has 1 day in October that he is in the office that is 100% booked, and no November or later schedule is laid.    Routing to BJT and PR at [facility] suggestion to see if he is having additional schedules laid before scheduling patient with a NP.  Please advise, thanks!

## 2019-01-19 NOTE — Telephone Encounter (Signed)
This is fine with me   

## 2019-01-21 NOTE — Telephone Encounter (Signed)
No new openings in MRs schedule for October as of today. Will wait for Burman Nieves and patrice to advise on

## 2019-01-22 NOTE — Telephone Encounter (Signed)
Patient scheduled with MR on 02/16/2019-pr

## 2019-02-02 ENCOUNTER — Other Ambulatory Visit: Payer: Self-pay | Admitting: Primary Care

## 2019-02-16 ENCOUNTER — Other Ambulatory Visit: Payer: Self-pay

## 2019-02-16 ENCOUNTER — Encounter (HOSPITAL_COMMUNITY): Payer: Self-pay | Admitting: *Deleted

## 2019-02-16 ENCOUNTER — Ambulatory Visit (INDEPENDENT_AMBULATORY_CARE_PROVIDER_SITE_OTHER): Payer: Commercial Managed Care - PPO | Admitting: Internal Medicine

## 2019-02-16 ENCOUNTER — Encounter: Payer: Self-pay | Admitting: Internal Medicine

## 2019-02-16 VITALS — BP 112/80 | HR 104 | Temp 97.0°F | Ht 60.0 in | Wt 228.2 lb

## 2019-02-16 DIAGNOSIS — Z8679 Personal history of other diseases of the circulatory system: Secondary | ICD-10-CM | POA: Diagnosis not present

## 2019-02-16 DIAGNOSIS — R0600 Dyspnea, unspecified: Secondary | ICD-10-CM | POA: Diagnosis not present

## 2019-02-16 MED ORDER — BUDESONIDE-FORMOTEROL FUMARATE 160-4.5 MCG/ACT IN AERO: INHALATION_SPRAY | RESPIRATORY_TRACT | 5 refills | Status: DC

## 2019-02-16 NOTE — Progress Notes (Signed)
OV 02/16/2019  Subjective:  Patient ID: Meredith Diaz, female , DOB: [birth year] , age 38 y.o. , MRN: [id number] , ADDRESS: [place]  [id number]   02/16/2019 -   Chief Complaint  Patient presents with   Follow-up     HPI Meredith Diaz 38 y.o. -has been referred by Meredith Diaz for shortness of breath evaluation.  History is gained from talking to the patient, review of the prior notes.  She is to be a longstanding patient of Meredith Diaz till he retired.  Since then she is seeing Meredith Diaz for sleep apnea.  She admits that she is not compliant with her CPAP using it only 1 or 2 times a week.  This is because she finds it difficult to tolerate.  She has class III dyspnea on exertion that is stable.  Not much of a cough or wheezing.  She has a diagnosis of asthma from childhood although she thinks this was a misdiagnosis.  She is on Symbicort but she does tell me the Symbicort helps.  Her allergy test in 2017 was positive.  She had a right heart catheterization in spring 2020 that showed elevated wedge pressures and elevated pulmonary pressures.  She is on diuresis.  She could not attend cardiac rehab because of the national pandemic with coronavirus.  She is willing to attend pulmonary rehab right now.  That is if they will take her.  She has chronic systolic and diastolic heart failure.  She is born with severe scoliosis and she suffers from morbid obesity.  Overall she rates her health is stable.  Review of the labs reveal hypercapnia but she is not on BiPAP.  She is clear that she is only on CPAP but she takes it very rarely.  For the last few years she is also been on oxygen at night and since November 2019 she is using 2 L to 3 L continuously.   Reports she has raynaud +.  ANA negative in 2017  Last PFT 2016 with severe restriction  PMH: copied and pasted from Dr Loralie Diaz 1. Chronic systolic CHF: Nonischemic cardiomyopathy. - possibly  viral - EF 25-30% by echo in 2013.  - Cardiac MRI (2013) with EF 52%, no delayed enhancement.  - LHC in 2013 without significant CAD. - 11/15 echo with EF 40-45%.  - 7/16 echo with EF 40-45%, grade II diastolic dysfunction.  - ETT (8/16) with poor exercise tolerance but no ischemia.  - CPX (12/16): peak VO2 15.7 (23.5 adjusted for ideal body weight), VE/VCO2 slope 21, RER 1.15 =>moderate functional limitation thought to be primarily due to restrictive lung physiology.  - Echo (11/17): EF 45-50%, mild diffuse hypokinesis, normal RV size and systolic function. - Echo (11/19): EF 50-55%, mildly dilated and mildly dysfunctional RV with respirophasic septal bounce noted, normal IVC and PA pressure estimate. - RHC (3/20): mean 19, PA 54/18 mean 38, mean PCWP 23, CI 2.77, PVR 2.79 WU. Equalization of pressures suggestive of restrictive cardiomyopathy, not suggestive of pericardial constriction.  2. Asthma: PFTs (4/16) with mild obstruction, severe restriction and severely decreased DLCO =>think primarily due to body habitus.   - 2017 Allergy tgest positive but normal IgE 3. Depression 4. Hypothyroidism 5. 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  6. Angioedema with ACEI 7.OHS/OSA: CPAPat night, oxygen during the day.  8. Chronic leukocytosis: Negative workup.  9. BPPV 10. Obesity Morbid 11. Scoliosis: CT chest (5/20) showed no ILD,  severe scoliosis.  12. Possible hypercapnia with VBG Results for Meredith Diaz (MRN [id number]) as of 02/16/2019 09:10  Ref. Range 07/10/2018 10:12  pH, Ven Latest Ref Range: 7.250 - 7.430  7.296  pCO2, Ven Latest Ref Range: 44.0 - 60.0 mmHg 76.6 (HH)  pO2, Ven Latest Ref Range: 32.0 - 45.0 mmHg 41.0   ROS - per HPI     has a past medical history of Anxiety, Asthma, Back pain, Chronic combined systolic and diastolic CHF (congestive heart failure) (HCC) (07/2011), Congenital heart disease, adult, Depression, Dyspnea, Essential  hypertension, Family history of breast cancer, Family history of colon cancer, Fibromyalgia, Gallbladder problem, Hypothyroidism, IBS (irritable bowel syndrome), Morbid obesity (HCC), Nonischemic cardiomyopathy (HCC), Scoliosis, Sleep apnea, Stomach problems, Thyroid disease, Vitamin B 12 deficiency, and Vitamin D deficiency.   reports that she has never smoked. She has never used smokeless tobacco.  Past Surgical History:  Procedure Laterality Date   CARDIAC CATHETERIZATION  08/2011   Normal Coronaries - LVEDP 32 mmHg   Cardiac MRI  April 2013   No suggestion of a congenital abnormality; EF ~ 52% - no regional wall motion abnormalities., no increased myocardial signal intensity. No perfusion abnormality.   CHOLECYSTECTOMY  2013   RIGHT AND LEFT HEART CATH N/A 07/10/2018   Procedure: RIGHT AND LEFT HEART CATH;  Surgeon: Meredith Diaz, Meredith Diaz;  [facility];  Service: Cardiovascular;  Laterality: N/A;   TRANSTHORACIC ECHOCARDIOGRAM  07/2011   Moderate concentric LVH, mildly dilated. EF 25-30% no diastolic dysfunction parameters to suggest elevated LAP   TRANSTHORACIC ECHOCARDIOGRAM  December 2015; July 2016   a)  EF by echo 40-45% with global hypokinesis, mild LVH possible apical variant hypertrophic cardiomyopathy;; b)  EF 40-45%. Normal wall thickness (compared to prior reading of possible apical LVH), grade 2 DD -- with elevated LV filling pressures (however left atrium was read as normal size), normal artery function and size. No no suggestion of pulmonary hypertension    Allergies  Allergen Reactions   Lisinopril Swelling and Other (See Comments)    Angioedema facial, mostly just lips, no trouble breathing    Immunization History  Administered Date(s) Administered   Influenza,inj,Quad PF,6-35 Mos 02/08/2018   Influenza-Unspecified 01/15/2014, 02/04/2015, 02/14/2015, 02/07/2016   Pneumococcal Polysaccharide-23 03/12/2018   Tdap 09/26/2015    Family History    Problem Relation Age of Onset   Depression Mother    Alcoholism Mother    Hypertension Father    Heart disease Father    Colon cancer Maternal Grandmother 62   Heart attack Maternal Grandmother    Heart disease Maternal Grandfather    Heart attack Maternal Grandfather    Heart disease Paternal Grandmother    Breast cancer Paternal Grandmother 36       possible bilateral cancer   Asthma Paternal Grandmother    Heart attack Paternal Grandmother    Heart attack Paternal Grandfather    Breast cancer Maternal Aunt 50   Breast cancer Paternal Aunt 50   Stroke Neg Hx    Thyroid disease Neg Hx      Current Outpatient Medications:    albuterol (VENTOLIN HFA) 108 (90 Base) MCG/ACT inhaler, INHALE 1-2 PUFFS INTO THE LUNGS EVERY 6 (SIX) HOURS AS NEEDED FOR WHEEZING OR SHORTNESS OF BREATH., Disp: 18 Inhaler, Rfl: 3   budesonide-formoterol (SYMBICORT) 160-4.5 MCG/ACT inhaler, TAKE 2 PUFFS BY MOUTH TWICE A DAY, Disp: 10.2 Inhaler, Rfl: 5   clobetasol cream (TEMOVATE) 0.05 %, Apply 1 application topically See  admin instructions. Apply daily to affected areas of genital area as directed, Disp: , Rfl:    clonazePAM (KLONOPIN) 1 MG tablet, TAKE 0.5-1 TABLET BY MOUTH TWICE DAILY, Disp: 60 tablet, Rfl: 0   ipratropium-albuterol (DUONEB) 0.5-2.5 (3) MG/3ML SOLN, Take 3 mLs by nebulization 2 (two) times daily as needed (For wheezing or shoortness of breath). (Patient taking differently: Take 3 mLs by nebulization 2 (two) times daily as needed (For wheezing or shortness of breath). ), Disp: 180 mL, Rfl: 5   levothyroxine (SYNTHROID, LEVOTHROID) 125 MCG tablet, Take 1 tablet (125 mcg total) by mouth daily., Disp: 90 tablet, Rfl: 2   metoprolol succinate (TOPROL-XL) 25 MG 24 hr tablet, Take 1 tablet (25 mg total) by mouth at bedtime., Disp: 30 tablet, Rfl: 6   potassium chloride 20 MEQ TBCR, Take 20 mEq by mouth 2 (two) times daily., Disp: 180 tablet, Rfl: 0   spironolactone  (ALDACTONE) 25 MG tablet, TAKE 1 TABLET BY MOUTH EVERY DAY, Disp: 30 tablet, Rfl: 11   torsemide (DEMADEX) 20 MG tablet, Take 4 tablets (80 mg total) by mouth every morning AND 3 tablets (60 mg total) at bedtime., Disp: 630 tablet, Rfl: 3   valsartan (DIOVAN) 80 MG tablet, Take 1 tablet (80 mg total) by mouth daily., Disp: 60 tablet, Rfl: 6   vortioxetine HBr (TRINTELLIX) 10 MG TABS tablet, Take 1 tablet (10 mg total) by mouth daily., Disp: 90 tablet, Rfl: 1      Objective:   Vitals:   02/16/19 0902  BP: 112/80  Pulse: (!) 104  Temp: (!) 97 F (36.1 C)  TempSrc: Oral  SpO2: 90%  Weight: 103.5 kg  Height: 5' (1.524 m)    Estimated body mass index is 44.57 kg/m as calculated from the following:   Height as of this encounter: 5' (1.524 m).   Weight as of this encounter: 103.5 kg.  @WEIGHTCHANGE @  Filed Weights   02/16/19 0902  Weight: 103.5 kg     Physical Exam  General Appearance:    Alert, cooperative, no distress, appears stated age - adel  , Deconditioned looking - no , OBESE  - yes, Sitting on Wheelchair -  no  Head:    Normocephalic, without obvious abnormality, atraumatic  Eyes:    PERRL, conjunctiva/corneas clear,  Ears:    Normal TM's and external ear canals, both ears  Nose:   Nares normal, septum midline, mucosa normal, no drainage    or sinus tenderness. OXYGEN ON  - yes . Patient is @ 2L   Throat:   Lips, mucosa, and tongue normal; teeth and gums normal. Cyanosis on lips - no  Neck:   Supple, symmetrical, trachea midline, no adenopathy;    thyroid:  no enlargement/tenderness/nodules; no carotid   bruit or JVD  Back:     Symmetric, no curvature, ROM normal, no CVA tenderness  Lungs:     Distress - no , Wheeze no, Barrell Chest - no, Purse lip breathing - no, Crackles - no   Chest Wall:    No tenderness or deformity.    Heart:    Regular rate and rhythm, S1 and S2 normal, no rub   or gallop, Murmur - no  Breast Exam:    NOT DONE  Abdomen:     Soft,  non-tender, bowel sounds active all four quadrants,    no masses, no organomegaly. Visceral obesity - yes  Genitalia:   NOT DONE  Rectal:   NOT DONE  Extremities:  Extremities - normal, Has Cane - n, Clubbing - no, Edema - no  Pulses:   2+ and symmetric all extremities  Skin:   Stigmata of Connective Tissue Disease - no  Lymph nodes:   Cervical, supraclavicular, and axillary nodes normal  Psychiatric:  Neurologic:   Pleasant - yes, Anxious - no, Flat affect - no  CAm-ICU - neg, Alert and Oriented x 3 - yes, Moves all 4s - yes, Speech - normal, Cognition - intact           Assessment:       ICD-10-CM   1. Dyspnea, unspecified type  R06.00 AMB referral to pulmonary rehabilitation    ANA+ENA+DNA/DS+Scl 70+SjoSSA/B    Rheumatoid Factor    Cyclic citrul peptide antibody, IgG    Pulmonary function test    Cyclic citrul peptide antibody, IgG    Rheumatoid Factor    ANA+ENA+DNA/DS+Scl 70+SjoSSA/B  2. History of Raynaud's syndrome  Z86.79 ANA+ENA+DNA/DS+Scl 70+SjoSSA/B    Rheumatoid Factor    Cyclic citrul peptide antibody, IgG    Cyclic citrul peptide antibody, IgG    Rheumatoid Factor    ANA+ENA+DNA/DS+Scl 70+SjoSSA/B   Her dyspnea is clearly multifactorial.  This comes from a morbid obesity, scoliosis, possible small airway disease [asthma), pulmonary hypertension, uncontrolled sleep apnea.  She might also be hypercapnic.  On top of this the chronic systolic and diastolic heart failure also contributing.  Very difficult problem to treat.  There is no evidence of interstitial lung disease.  Doing a pulmonary function test will not be helpful  At this point in time we will recommend pulmonary rehabilitation to help with physical conditioning.  We will hold off on doing a pulmonary function test.  We will get autoimmune profile in case there is evidence of autoimmune disease contributing to the pulmonary hypertension.  I have asked her to continue her asthma treatment with Symbicort.   At some point in time we can do a challenge without her asthma inhalers and test and nitric oxide [at this point in time I cannot do this test because of the COVID pandemic].  We will also get a blood gas test and if this is hypercapnia she might be a better candidate for BiPAP at night.  She will her to follow-up with Dr. Val Eagle for sleep apnea issues.    Plan:     Patient Instructions     ICD-10-CM   1. Dyspnea, unspecified type  R06.00   2. History of Raynaud's syndrome  Z86.79     Refer pulmonary rehab Do ANA, DS-DNA, RF, CCP, scl-70 and ssA/SsB Continue o2 as before Improve compliance with CPAP Check ABG blood test   - depending on ABG results might be a BiPAP candidate  Followup  - next few months with Dr Aldean Ast for sleep  - 3 to 6 months with Dr Marchelle Gearing to discuss followup from rehab   She is agreeable to this plan  > 50% of this > 40 min visit spent in face to face counseling or/and coordination of care - by this undersigned Diaz - Dr Kalman Shan. This includes one or more of the following documented above: discussion of test results, diagnostic or treatment recommendations, prognosis, risks and benefits of management options, instructions, education, compliance or risk-factor reduction   SIGNATURE    Dr. Kalman Shan, M.D., F.C.C.P,  Pulmonary and Critical Care Medicine Staff Physician, [facility] Center Director - Interstitial Lung Disease  Program  Pulmonary Fibrosis [facility] at  [facility] [place], [place], [address]  Pager: [phone], If no answer or between  15:00h - 7:00h: call [phone] Telephone: [phone]  9:40 AM 02/16/2019

## 2019-02-16 NOTE — Patient Instructions (Addendum)
ICD-10-CM   1. Dyspnea, unspecified type  R06.00   2. History of Raynaud's syndrome  Z86.79     Refer pulmonary rehab Do ANA, DS-DNA, RF, CCP, scl-70 and ssA/SsB Continue o2 as before Improve compliance with CPAP Check ABG blood test   - depending on ABG results might be a BiPAP candidate  Followup  - next few months with Dr Gala Murdoch for sleep  - 3 to 6 months with Dr Chase Caller to discuss followup from rehab

## 2019-02-16 NOTE — Progress Notes (Addendum)
Received referral from Dr. Chase Caller for this pt to participate in Pulmonary rehab with the diagnosis of Dyspnea unspecified. Pt with multiple contributing factors such as morbid obesity, scoliosis, pulmonary htn, sleep apnea, congestive heart failure combined.  Pt seen in the HF clinic by Dr. Aundra Dubin.  This pt is well know to our cardiac rehab staff from previous participation most recently earlier this year that was stopped due to closure of the department for Covid -19.  Pt did continue for short period of time on the virtual platform. Clinical review of pt follow up appt on 10/12 with Dr. Chase Caller - Pulmonary office note. Pending lab work for blood gas - evaluate hypercapnia and autoimmune profile.  Pt with covid risks score - 5.   Pt appropriate for scheduling for Pulmonary rehab.  Will forward to support staff for scheduling which may be tricky since she [place] works during the day. Will verify insurance eligibility/benefits with pt  consent. Cherre Huger, BSN Cardiac and Training and development officer

## 2019-02-17 ENCOUNTER — Telehealth (HOSPITAL_COMMUNITY): Payer: Self-pay

## 2019-02-17 LAB — RHEUMATOID FACTOR: Rheumatoid fact SerPl-aCnc: <14 IU/mL (ref <14)

## 2019-02-17 LAB — CYCLIC CITRUL PEPTIDE ANTIBODY, IGG: Cyclic Citrullin Peptide Ab: <16 UNITS

## 2019-02-17 NOTE — Telephone Encounter (Signed)
Pt insurance is active and benefits verified through [facility]. Co-pay $0.00, DED $6,000.00/$1,695.92 met, out of pocket $6,000.00/$1,695.92 met, co-insurance 20%. No pre-authorization required. Alex/[facility], 02/17/2019 @ 220PM, REF# [id number]

## 2019-02-19 ENCOUNTER — Ambulatory Visit (HOSPITAL_COMMUNITY)
Admission: RE | Admit: 2019-02-19 | Discharge: 2019-02-19 | Disposition: A | Discharge status: Home / Self Care | Payer: Commercial Managed Care - PPO | Source: Ambulatory Visit | Attending: Internal Medicine | Admitting: Internal Medicine

## 2019-02-19 ENCOUNTER — Other Ambulatory Visit: Payer: Self-pay

## 2019-02-19 DIAGNOSIS — J9692 Respiratory failure, unspecified with hypercapnia: Secondary | ICD-10-CM | POA: Insufficient documentation

## 2019-02-19 LAB — ANA+ENA+DNA/DS+SCL 70+SJOSSA/B
ANA Titer 1: NEGATIVE
ENA RNP Ab: <0.2 AI (ref 0.0–0.9)
ENA SM Ab Ser-aCnc: <0.2 AI (ref 0.0–0.9)
ENA SSA (RO) Ab: <0.2 AI (ref 0.0–0.9)
ENA SSB (LA) Ab: <0.2 AI (ref 0.0–0.9)
Scleroderma (Scl-70) (ENA) Antibody, IgG: <0.2 AI (ref 0.0–0.9)
dsDNA Ab: 3 IU/mL (ref 0–9)

## 2019-02-19 LAB — BLOOD GAS, ARTERIAL
Acid-Base Excess: 10.5 mmol/L — ABNORMAL HIGH (ref 0.0–2.0)
Bicarbonate: 35.5 mmol/L — ABNORMAL HIGH (ref 20.0–28.0)
Drawn by: 560031
O2 Content: 3 L/min
O2 Saturation: 97.8 %
Patient temperature: 98.6
pCO2 arterial: 57.4 mmHg — ABNORMAL HIGH (ref 32.0–48.0)
pH, Arterial: 7.409 (ref 7.350–7.450)
pO2, Arterial: 101 mmHg (ref 83.0–108.0)

## 2019-02-23 ENCOUNTER — Telehealth: Payer: Self-pay | Admitting: Internal Medicine

## 2019-02-23 NOTE — Telephone Encounter (Signed)
Meredith Diaz: Marland KitchenPlease let Meredith Diaz no other autoimmune antibody test was normal but blood gas shows high carbon dioxide and when she sees Dr. Ander Slade on February 25, 2019 to discuss the possible role of BiPAP at night instead of CPAP [I have copied Dr. O]

## 2019-02-24 NOTE — Telephone Encounter (Signed)
Called and spoke to patient. Relayed message from Dr. Chase Caller. Patient verbalized understanding and stated she has had high carbon dioxide in the past. Patient stated she looks forward to talking with Dr. Ander Slade tomorrow. Nothing further needed at this time.

## 2019-02-25 ENCOUNTER — Encounter: Payer: Self-pay | Admitting: Pulmonary Disease

## 2019-02-25 ENCOUNTER — Ambulatory Visit (INDEPENDENT_AMBULATORY_CARE_PROVIDER_SITE_OTHER): Payer: Commercial Managed Care - PPO | Admitting: Pulmonary Disease

## 2019-02-25 ENCOUNTER — Other Ambulatory Visit: Payer: Self-pay

## 2019-02-25 VITALS — BP 126/68 | HR 67 | Ht 60.0 in | Wt 228.4 lb

## 2019-02-25 DIAGNOSIS — J9692 Respiratory failure, unspecified with hypercapnia: Secondary | ICD-10-CM

## 2019-02-25 DIAGNOSIS — R0602 Shortness of breath: Secondary | ICD-10-CM

## 2019-02-25 NOTE — Progress Notes (Signed)
Subjective:    Patient ID: Meredith Diaz, female    DOB: Jul 01, 1980, 38 y.o.   MRN: [id number]  Patient with a history of obstructive sleep apnea  Has not been able to use CPAP on a regular basis, mask issues Comorbidities include history of systolic and diastolic congestive heart failure, hypothyroidism, chronic fatigue, kyphoscoliosis  Diagnosed with obstructive sleep apnea in 2016 Usually goes to bed about 10 PM to midnight Takes her between 10 and 30 minutes to fall asleep Rarely wakes up in the middle of the night  Final awakening time about 8 AM  Has not been very compliant with CPAP use secondary to mask issues  Was recently seen by Dr. Marchelle Gearing for possible interstitial lung disease High-resolution CT scan of the chest was not confirmatory of interstitial lung disease  PFT from 2016 did reveal severe restrictive disease Significant decrease in lung volumes also noted on CT scan of the chest Significant subcutaneous tissue, mediastinal lipomatosis  She has been referred to pulmonary rehab, referred to bariatric surgery as well    Past Medical History:  Diagnosis Date  . Anxiety   . Asthma   . Back pain   . Chronic combined systolic and diastolic CHF (congestive heart failure) (HCC) 07/2011   a. Prior Echo: EF was 25-30%; b. No Sig CAD on CATH; c. No significant finding on Cardiac MRI; d. follow-up Echo 2014 --  EF of 50%;  e. 03/2014 Echo: EF 40-45%, gr 1 DD; f. 11/2014 Echo: EF 40-45%, Gr 2 DD.  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  . Depression   . Dyspnea   . Essential hypertension   . Family history of breast cancer   . Family history of colon cancer   . Fibromyalgia   . Gallbladder problem   . Hypothyroidism   . IBS (irritable bowel syndrome)   . Morbid obesity (HCC)   . Nonischemic  cardiomyopathy (HCC)    a. unclear etiology;  b. 01/2012 Cardiac MRI: no infiltrative pathology, EF 52%;  b. 11/2014 Echo: EF 40-45%, gr 2 DD.  [name] Scoliosis   . Sleep apnea   . Stomach problems   . Thyroid disease   . Vitamin B 12 deficiency   . Vitamin D deficiency    Social History   Socioeconomic History  . Marital status: Married    Spouse name: [name] Rieger  . Number of children: 0  . Years of education: Not on file  . Highest education level: Not on file  Occupational History  . Occupation: Programmer, multimedia  Social Needs  . Financial resource strain: Not on file  . Food insecurity    Worry: Not on file    Inability: Not on file  . Transportation needs    Medical: Not on file    Non-medical: Not on file  Tobacco Use  . Smoking status: Never Smoker  . Smokeless tobacco: Never Used  Substance and Sexual Activity  . Alcohol use: No  . Drug use: No  . Sexual activity: Not on file  Lifestyle  . Physical activity    Days per week: Not on file    Minutes per session: Not on file  . Stress: Not on file  Relationships  . Social Musician on phone: Not on file    Gets together:  Not on file    Attends religious service: Not on file    Active member of club or organization: Not on file    Attends meetings of clubs or organizations: Not on file    Relationship status: Not on file  . Intimate partner violence    Fear of current or ex partner: Not on file    Emotionally abused: Not on file    Physically abused: Not on file    Forced sexual activity: Not on file  Other Topics Concern  . Not on file  Social History Narrative  . Not on file   Family History  Problem Relation Age of Onset  . Depression Mother   . Alcoholism Mother   . Hypertension Father   . Heart disease Father   . Colon cancer Maternal Grandmother 56  . Heart attack Maternal Grandmother   . Heart disease Maternal Grandfather   . Heart attack Maternal Grandfather   . Heart disease Paternal  Grandmother   . Breast cancer Paternal Grandmother 81       possible bilateral cancer  . Asthma Paternal Grandmother   . Heart attack Paternal Grandmother   . Heart attack Paternal Grandfather   . Breast cancer Maternal Aunt 50  . Breast cancer Paternal Aunt 96  . Stroke Neg Hx   . Thyroid disease Neg Hx        Review of Systems  Constitutional: Negative for fever and unexpected weight change.  HENT: Negative for congestion, dental problem, ear pain, nosebleeds, postnasal drip, rhinorrhea, sinus pressure, sneezing, sore throat and trouble swallowing.   Eyes: Negative for redness and itching.  Respiratory: Positive for shortness of breath. Negative for cough, chest tightness and wheezing.   Cardiovascular: Negative for palpitations and leg swelling.  Gastrointestinal: Negative for nausea and vomiting.  Genitourinary: Negative for dysuria.  Musculoskeletal: Negative for joint swelling.  Skin: Negative for rash.  Allergic/Immunologic: Negative.  Negative for environmental allergies, food allergies and immunocompromised state.  Neurological: Negative for headaches.  Hematological: Does not bruise/bleed easily.  Psychiatric/Behavioral: Negative for dysphoric mood. The patient is nervous/anxious.        Objective:   Physical Exam Constitutional:      General: She is not in acute distress.    Appearance: She is obese.  HENT:     Head: Normocephalic and atraumatic.     Nose: Nose normal. No congestion.     Mouth/Throat:     Mouth: Mucous membranes are moist.  Eyes:     General:        Right eye: No discharge.        Left eye: No discharge.     Pupils: Pupils are equal, round, and reactive to light.  Neck:     Musculoskeletal: Normal range of motion and neck supple. No neck rigidity.  Cardiovascular:     Rate and Rhythm: Normal rate and regular rhythm.     Heart sounds: No murmur.  Pulmonary:     Effort: Pulmonary effort is normal. No respiratory distress.     Breath  sounds: Normal breath sounds. No stridor. No wheezing or rhonchi.  Abdominal:     General: There is no distension.  Musculoskeletal: Normal range of motion.        General: No swelling.  Skin:    General: Skin is warm.     Coloration: Skin is not jaundiced or pale.  Neurological:     General: No focal deficit present.     Mental  Status: She is alert and oriented to person, place, and time.  Psychiatric:        Mood and Affect: Mood normal.        Behavior: Behavior normal.    Vitals:   02/25/19 0921  BP: 126/68  Pulse: 67  SpO2: 97%   Results of the Epworth flowsheet 02/25/2019 06/10/2018  Sitting and reading 2 1  Watching TV 2 1  Sitting, inactive in a public place (e.g. a theatre or a meeting) 0 0  As a passenger in a car for an hour without a break 3 3  Lying down to rest in the afternoon when circumstances permit 3 3  Sitting and talking to someone 0 0  Sitting quietly after a lunch without alcohol 1 1  In a car, while stopped for a few minutes in traffic 0 0  Total score 11 9   CT scan of the chest reviewed-10/01/2018-reviewed by myself with the patient, previous CT 23557 19 also reviewed, 10/10/2016 reviewed  Pulmonary function study from 08/25/2014 reviewed showing severe restriction  Echocardiogram from 03/11/2018 reviewed showing normal systolic function  Arterial blood gases have revealed hypercapnia    Assessment & Plan:  Obstructive sleep apnea Obesity hypoventilation Combined systolic and diastolic heart failure Cardiomyopathy Restrictive lung disease Hypothyroidism Kyphoscoliosis Morbid obesity Chronic hypoxemia  Plan: Schedule the patient for an in lab polysomnogram, needs titrated to BiPAP Currently fairly in CPAP therapy BiPAP needed for obesity hypoventilation, restrictive lung disease  Obesity hypoventilation Patient encouraged to double weight loss efforts-follow-up with bariatric surgery Follow-up with pulmonary rehab Graded exercises as  tolerated  Combined systolic and diastolic dysfunction Following up with cardiology  We will repeat PFT Obtain sleep study  I will see her back in the office in about 6 weeks  Encouraged redouble efforts regarding weight loss and increased physical activity  She will continue inhalers-previous PFT did not reveal significant obstructive airway disease or airway hyperresponsiveness  Multifactorial reasons for symptoms A significant factor remains an increasing BMI

## 2019-02-25 NOTE — Patient Instructions (Signed)
History of obstructive sleep apnea Hypercapnic respiratory failure Obesity hypoventilation -We need to retitrate you to BiPAP to help blow off carbon dioxide while you are sleeping -We will repeat your breathing study-check for any progression from previous, assess restriction  Follow-up with bariatric surgery  Follow-up with pulmonary rehab  Goal is to change the trajectory of the weight gain  I will see you back in about 6 weeks Call with any significant concerns  Continue current inhalers

## 2019-03-02 ENCOUNTER — Encounter (HOSPITAL_COMMUNITY)
Admission: RE | Admit: 2019-03-02 | Discharge: 2019-03-02 | Disposition: A | Discharge status: Home / Self Care | Payer: Commercial Managed Care - PPO | Source: Ambulatory Visit | Attending: Internal Medicine | Admitting: Internal Medicine

## 2019-03-02 ENCOUNTER — Other Ambulatory Visit: Payer: Self-pay

## 2019-03-02 VITALS — BP 104/60 | HR 87 | Ht 60.0 in | Wt 229.7 lb

## 2019-03-02 DIAGNOSIS — R0600 Dyspnea, unspecified: Secondary | ICD-10-CM | POA: Diagnosis not present

## 2019-03-02 NOTE — Progress Notes (Signed)
Meredith Diaz 38 y.o. female Pulmonary Rehab Orientation Note Patient arrived today in [facility] for orientation and walk test to Pulmonary Rehab. She walked from the [place] lot carrying her portable oxygen concentrator on 3 L pulsed. She does carry portable oxygen. Per pt, she uses oxygen continuously. Color good, skin warm and dry. Patient is oriented to time and place. Patient's medical history, psychosocial health, and medications reviewed. Psychosocial assessment reveals pt lives with their spouse. 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 Pt hobbies include reading and crocheting. Pt reports her stress level is high. Areas of stress/anxiety include Health and dealing with a chronic illness.  Pt does exhibit  signs of depression. Signs of depression include sadness and sleeping too much. PHQ2/9 score 1/9. Pt shows fair  coping skills with positive outlook .  Offered emotional support and reassurance. Will continue to monitor and evaluate progress toward psychosocial goal(s) of improved quality of life so as to participate in meaningful activities. Physical assessment reveals heart rate is normal, breath sounds clear to auscultation, no wheezes, rales, or rhonchi. Grip strength equal, strong. Patient reports she does take medications as prescribed. Patient states she follows a Low Sodium diet. She has gained approximately 10 pounds since the COVID-19 precautions have been in place. Patient's weight will be monitored closely. Demonstration and practice of PLB using pulse oximeter. Patient able to return demonstration satisfactorily. Safety and hand hygiene in the exercise area reviewed with patient. Patient voices understanding of the information reviewed. Department expectations discussed with patient and achievable goals were set. The patient shows enthusiasm about attending the program and we look forward to working  with this nice lady. The patient completed  a 6 min walk test today and to begin exercise on Tuesday, March 10, 2019 in the 1300 class.  [id number]

## 2019-03-02 NOTE — Progress Notes (Signed)
Pulmonary Individual Treatment Plan  Patient Details  Name: Meredith Diaz MRN: [id number] Date of Birth: Sep 23, 1980 Referring Provider:     Pulmonary Rehab Walk Test from 03/02/2019 in [facility]ARDI[facility]  Referring Provider  Dr. Marchelle Gearing      Initial Encounter Date:    Pulmonary Rehab Walk Test from 03/02/2019 in [facility] CARDI[facility] REHAB  Date  03/02/19      Visit Diagnosis: Dyspnea, unspecified type  Patient's Home Medications on Admission:   Current Outpatient Medications:  .  albuterol (VENTOLIN HFA) 108 (90 Base) MCG/ACT inhaler, INHALE 1-2 PUFFS INTO THE LUNGS EVERY 6 (SIX) HOURS AS NEEDED FOR WHEEZING OR SHORTNESS OF BREATH., Disp: 18 Inhaler, Rfl: 3 .  budesonide-formoterol (SYMBICORT) 160-4.5 MCG/ACT inhaler, TAKE 2 PUFFS BY MOUTH TWICE A DAY, Disp: 10.2 Inhaler, Rfl: 5 .  clobetasol cream (TEMOVATE) 0.05 %, Apply 1 application topically See admin instructions. Apply daily to affected areas of genital area as directed, Disp: , Rfl:  .  ipratropium-albuterol (DUONEB) 0.5-2.5 (3) MG/3ML SOLN, Take 3 mLs by nebulization 2 (two) times daily as needed (For wheezing or shoortness of breath). (Patient taking differently: Take 3 mLs by nebulization 2 (two) times daily as needed (For wheezing or shortness of breath). ), Disp: 180 mL, Rfl: 5 .  levothyroxine (SYNTHROID, LEVOTHROID) 125 MCG tablet, Take 1 tablet (125 mcg total) by mouth daily., Disp: 90 tablet, Rfl: 2 .  potassium chloride 20 MEQ TBCR, Take 20 mEq by mouth 2 (two) times daily., Disp: 180 tablet, Rfl: 0 .  spironolactone (ALDACTONE) 25 MG tablet, TAKE 1 TABLET BY MOUTH EVERY DAY, Disp: 30 tablet, Rfl: 11 .  torsemide (DEMADEX) 20 MG tablet, Take 4 tablets (80 mg total) by mouth every morning AND 3 tablets (60 mg total) at bedtime., Disp: 630 tablet, Rfl: 3 .  valsartan (DIOVAN) 80 MG tablet, Take 1 tablet (80 mg total) by mouth daily., Disp: 60 tablet, Rfl: 6 .  vortioxetine  HBr (TRINTELLIX) 10 MG TABS tablet, Take 1 tablet (10 mg total) by mouth daily., Disp: 90 tablet, Rfl: 1 .  clonazePAM (KLONOPIN) 1 MG tablet, TAKE 0.5-1 TABLET BY MOUTH TWICE DAILY (Patient not taking: Reported on 03/02/2019), Disp: 60 tablet, Rfl: 0 .  metoprolol succinate (TOPROL-XL) 25 MG 24 hr tablet, Take 1 tablet (25 mg total) by mouth at bedtime., Disp: 30 tablet, Rfl: 6  Past Medical History: Past Medical History:  Diagnosis Date  . Anxiety   . Asthma   . Back pain   . Chronic combined systolic and diastolic CHF (congestive heart failure) (HCC) 07/2011   a. Prior Echo: EF was 25-30%; b. No Sig CAD on CATH; c. No significant finding on Cardiac MRI; d. follow-up Echo 2014 --  EF of 50%;  e. 03/2014 Echo: EF 40-45%, gr 1 DD; f. 11/2014 Echo: EF 40-45%, Gr 2 DD.  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  . Depression   . Dyspnea   . Essential hypertension   . Family history of breast cancer   . Family history of colon cancer   . Fibromyalgia   . Gallbladder problem   . Hypothyroidism   . IBS (irritable bowel syndrome)   . Morbid obesity (HCC)   . Nonischemic cardiomyopathy (HCC)    a. unclear etiology;  b. 01/2012  Cardiac MRI: no infiltrative pathology, EF 52%;  b. 11/2014 Echo: EF 40-45%, gr 2 DD.  [name] Scoliosis   . Sleep apnea   . Stomach problems   . Thyroid disease   . Vitamin B 12 deficiency   . Vitamin D deficiency     Tobacco Use: Social History   Tobacco Use  Smoking Status Never Smoker  Smokeless Tobacco Never Used    Labs: Recent Review Flowsheet Data    Labs for ITP Cardiac and Pulmonary Rehab Latest Ref Rng & Units 08/07/2017 04/16/2018 07/10/2018 07/10/2018 02/19/2019   Cholestrol 100 - 199 mg/dL 161 - - - -   LDLCALC 0 - 99 mg/dL 096(E) - - - -   HDL >45 mg/dL 49 - - - -   Trlycerides 0 - 149 mg/dL 409 - -  - -   Hemoglobin A1c 4.6 - 6.5 % 5.9(H) 6.4 - - -   PHART 7.350 - 7.450 - - - - 7.409   PCO2ART 32.0 - 48.0 mmHg - - - - 57.4(H)   HCO3 20.0 - 28.0 mmol/L - - 39.2(H) 37.3(H) 35.5(H)   TCO2 22 - 32 mmol/L - - 42(H) 40(H) -   O2SAT % - - 67.0 67.0 97.8      Capillary Blood Glucose: Lab Results  Component Value Date   GLUCAP 155 (H) 01/27/2015     Pulmonary Assessment Scores: Pulmonary Assessment Scores    Row Name 03/02/19 1055 03/02/19 1101       ADL UCSD   ADL Phase  Entry  Entry    SOB Score total  -  73      CAT Score   CAT Score  -  31      mMRC Score   mMRC Score  4  -      UCSD: Self-administered rating of dyspnea associated with activities of daily living (ADLs) 6-point scale (0 = "not at all" to 5 = "maximal or unable to do because of breathlessness")  Scoring Scores range from 0 to 120.  Minimally important difference is 5 units  CAT: CAT can identify the health impairment of COPD patients and is better correlated with disease progression.  CAT has a scoring range of zero to 40. The CAT score is classified into four groups of low (less than 10), medium (10 - 20), high (21-30) and very high (31-40) based on the impact level of disease on health status. A CAT score over 10 suggests significant symptoms.  A worsening CAT score could be explained by an exacerbation, poor medication adherence, poor inhaler technique, or progression of COPD or comorbid conditions.  CAT MCID is 2 points  mMRC: mMRC (Modified Medical Research Council) Dyspnea Scale is used to assess the degree of baseline functional disability in patients of respiratory disease due to dyspnea. No minimal important difference is established. A decrease in score of 1 point or greater is considered a positive change.   Pulmonary Function Assessment: Pulmonary Function Assessment - 03/02/19 0957      Breath   Bilateral Breath Sounds  Clear    Shortness of Breath  Panic with Shortness of  Breath;Limiting activity;Fear of Shortness of Breath;Yes       Exercise Target Goals: Exercise Program Goal: Individual exercise prescription set using results from initial 6 min walk test and THRR while considering  patient's activity barriers and safety.   Exercise Prescription Goal: Initial exercise prescription builds to 30-45 minutes a day of aerobic activity, 2-3 days per  week.  Home exercise guidelines will be given to patient during program as part of exercise prescription that the participant will acknowledge.  Activity Barriers & Risk Stratification:   6 Minute Walk: 6 Minute Walk    Row Name 03/02/19 1056         6 Minute Walk   Phase  Initial     Distance  861 feet     Walk Time  5.17 minutes     # of Rest Breaks  2 2 standing rest breaks. One was 35 sec and the other 15 sec     MPH  1.63     METS  2.95     RPE  17     Perceived Dyspnea   3     VO2 Peak  10.32     Symptoms  Yes (comment)     Comments  desaturated on 2L to 83%, saturations maintained at 90% and above on 3L     Resting HR  76 bpm     Resting BP  104/68     Resting Oxygen Saturation   98 %     Exercise Oxygen Saturation  during 6 min walk  83 %     Max Ex. HR  115 bpm     Max Ex. BP  122/80     2 Minute Post BP  110/82       Interval HR   1 Minute HR  95     2 Minute HR  106     3 Minute HR  110     4 Minute HR  115     5 Minute HR  114     6 Minute HR  107     2 Minute Post HR  107     Interval Heart Rate?  Yes       Interval Oxygen   Interval Oxygen?  Yes     Baseline Oxygen Saturation %  98 %     1 Minute Oxygen Saturation %  83 %     1 Minute Liters of Oxygen  2 L     2 Minute Oxygen Saturation %  94 %     2 Minute Liters of Oxygen  3 L     3 Minute Oxygen Saturation %  90 %     3 Minute Liters of Oxygen  3 L     4 Minute Oxygen Saturation %  97 %     4 Minute Liters of Oxygen  3 L     5 Minute Oxygen Saturation %  95 %     5 Minute Liters of Oxygen  3 L     6 Minute Oxygen  Saturation %  99 %     6 Minute Liters of Oxygen  3 L     2 Minute Post Oxygen Saturation %  95 %     2 Minute Post Liters of Oxygen  2 L        Oxygen Initial Assessment: Oxygen Initial Assessment - 03/02/19 1054      Home Oxygen   Home Oxygen Device  Home Concentrator;Portable Concentrator;E-Tanks    Sleep Oxygen Prescription  Continuous    Liters per minute  2    Home Exercise Oxygen Prescription  Pulsed    Home at Rest Exercise Oxygen Prescription  Continuous    Liters per minute  2    Compliance with Home Oxygen Use  Yes  Initial 6 min Walk   Oxygen Used  E-Tanks    Liters per minute  3   desat on 2L     Program Oxygen Prescription   Program Oxygen Prescription  Continuous    Liters per minute  3      Intervention   Short Term Goals  To learn and exhibit compliance with exercise, home and travel O2 prescription;To learn and understand importance of monitoring SPO2 with pulse oximeter and demonstrate accurate use of the pulse oximeter.;To learn and understand importance of maintaining oxygen saturations>88%;To learn and demonstrate proper pursed lip breathing techniques or other breathing techniques.;To learn and demonstrate proper use of respiratory medications    Long  Term Goals  Compliance with respiratory medication;Exhibits proper breathing techniques, such as pursed lip breathing or other method taught during program session;Maintenance of O2 saturations>88%;Verbalizes importance of monitoring SPO2 with pulse oximeter and return demonstration;Exhibits compliance with exercise, home and travel O2 prescription       Oxygen Re-Evaluation:   Oxygen Discharge (Final Oxygen Re-Evaluation):   Initial Exercise Prescription: Initial Exercise Prescription - 03/02/19 1100      Date of Initial Exercise RX and Referring Provider   Date  03/02/19    Referring Provider  Dr. Marchelle Gearing      Oxygen   Oxygen  Continuous    Liters  3      Recumbant Bike   Level  1     Minutes  15    METs  2      NuStep   Level  2    SPM  80    Minutes  15      Prescription Details   Frequency (times per week)  2    Duration  Progress to 30 minutes of continuous aerobic without signs/symptoms of physical distress      Intensity   THRR 40-80% of Max Heartrate  72-145    Ratings of Perceived Exertion  11-13    Perceived Dyspnea  0-4      Progression   Progression  Continue to progress workloads to maintain intensity without signs/symptoms of physical distress.      Resistance Training   Training Prescription  Yes    Weight  orange bands    Reps  10-15       Perform Capillary Blood Glucose checks as needed.  Exercise Prescription Changes:   Exercise Comments:   Exercise Goals and Review: Exercise Goals    Row Name 03/02/19 1100             Exercise Goals   Increase Physical Activity  Yes       Intervention  Provide advice, education, support and counseling about physical activity/exercise needs.;Develop an individualized exercise prescription for aerobic and resistive training based on initial evaluation findings, risk stratification, comorbidities and participant's personal goals.       Expected Outcomes  Short Term: Attend rehab on a regular basis to increase amount of physical activity.;Long Term: Add in home exercise to make exercise part of routine and to increase amount of physical activity.;Long Term: Exercising regularly at least 3-5 days a week.       Increase Strength and Stamina  Yes       Intervention  Provide advice, education, support and counseling about physical activity/exercise needs.;Develop an individualized exercise prescription for aerobic and resistive training based on initial evaluation findings, risk stratification, comorbidities and participant's personal goals.       Expected Outcomes  Short Term: Increase  workloads from initial exercise prescription for resistance, speed, and METs.       Able to understand and use rate of  perceived exertion (RPE) scale  Yes       Intervention  Provide education and explanation on how to use RPE scale       Expected Outcomes  Short Term: Able to use RPE daily in rehab to express subjective intensity level;Long Term:  Able to use RPE to guide intensity level when exercising independently       Able to understand and use Dyspnea scale  Yes       Intervention  Provide education and explanation on how to use Dyspnea scale       Expected Outcomes  Short Term: Able to use Dyspnea scale daily in rehab to express subjective sense of shortness of breath during exertion;Long Term: Able to use Dyspnea scale to guide intensity level when exercising independently       Knowledge and understanding of Target Heart Rate Range (THRR)  Yes       Intervention  Provide education and explanation of THRR including how the numbers were predicted and where they are located for reference       Expected Outcomes  Short Term: Able to state/look up THRR;Long Term: Able to use THRR to govern intensity when exercising independently;Short Term: Able to use daily as guideline for intensity in rehab       Understanding of Exercise Prescription  Yes       Intervention  Provide education, explanation, and written materials on patient's individual exercise prescription       Expected Outcomes  Short Term: Able to explain program exercise prescription;Long Term: Able to explain home exercise prescription to exercise independently          Exercise Goals Re-Evaluation :   Discharge Exercise Prescription (Final Exercise Prescription Changes):   Nutrition:  Target Goals: Understanding of nutrition guidelines, daily intake of sodium 1500mg , cholesterol 200mg , calories 30% from fat and 7% or less from saturated fats, daily to have 5 or more servings of fruits and vegetables.  Biometrics: Pre Biometrics - 03/02/19 0952      Pre Biometrics   Height  5' (1.524 m)    Weight  104.2 kg    BMI (Calculated)  44.86     Grip Strength  25 kg        Nutrition Therapy Plan and Nutrition Goals:   Nutrition Assessments:   Nutrition Goals Re-Evaluation:   Nutrition Goals Discharge (Final Nutrition Goals Re-Evaluation):   Psychosocial: Target Goals: Acknowledge presence or absence of significant depression and/or stress, maximize coping skills, provide positive support system. Participant is able to verbalize types and ability to use techniques and skills needed for reducing stress and depression.  Initial Review & Psychosocial Screening: Initial Psych Review & Screening - 03/02/19 [id number]      Initial Review   Current issues with  Current Depression;History of Depression;Current Stress Concerns    Source of Stress Concerns  Chronic Illness;Unable to perform yard/household activities      Family Dynamics   Good Support System?  Yes      Barriers   Psychosocial barriers to participate in program  The patient should benefit from training in stress management and relaxation.      Screening Interventions   Interventions  Encouraged to exercise       Quality of Life Scores:  Scores of 19 and below usually indicate a poorer quality of life in  these areas.  A difference of  2-3 points is a clinically meaningful difference.  A difference of 2-3 points in the total score of the Quality of Life Index has been associated with significant improvement in overall quality of life, self-image, physical symptoms, and general health in studies assessing change in quality of life.  PHQ-9: Recent Review Flowsheet Data    Depression screen [facility] 2/9 03/02/2019 06/09/2018 04/16/2018 08/07/2017   Decreased Interest 0 3 3 2    Down, Depressed, Hopeless 1 3 3 3    PHQ - 2 Score 1 6 6 5    Altered sleeping 1 2 2 2    Tired, decreased energy 2 3 3 3    Change in appetite 2 2 2 3    Feeling bad or failure about yourself  2 3 3 3    Trouble concentrating 0 2 2 2    Moving slowly or fidgety/restless 2 2 2 2    Suicidal thoughts 0 0  0 0   PHQ-9 Score - 20 20 20    Difficult doing work/chores Not difficult at all Very difficult - Very difficult     Interpretation of Total Score  Total Score Depression Severity:  1-4 = Minimal depression, 5-9 = Mild depression, 10-14 = Moderate depression, 15-19 = Moderately severe depression, 20-27 = Severe depression   Psychosocial Evaluation and Intervention: Psychosocial Evaluation - 03/02/19 1004      Psychosocial Evaluation & Interventions   Interventions  Relaxation education;Stress management education    Continue Psychosocial Services   Follow up required by counselor   Patient is seeing a counsellor biweekly      Psychosocial Re-Evaluation: Psychosocial Re-Evaluation    Row Name 03/02/19 1009             Psychosocial Re-Evaluation   Current issues with  Current Depression;History of Depression;Current Stress Concerns          Psychosocial Discharge (Final Psychosocial Re-Evaluation): Psychosocial Re-Evaluation - 03/02/19 1009      Psychosocial Re-Evaluation   Current issues with  Current Depression;History of Depression;Current Stress Concerns       Education: Education Goals: Education classes will be provided on a weekly basis, covering required topics. Participant will state understanding/return demonstration of topics presented.  Learning Barriers/Preferences: Learning Barriers/Preferences - 03/02/19 1009      Learning Barriers/Preferences   Learning Barriers  None    Learning Preferences  Individual Instruction;Computer/Internet;Written Material       Education Topics: Risk Factor Reduction:  -Group instruction that is supported by a PowerPoint presentation. Instructor discusses the definition of a risk factor, different risk factors for pulmonary disease, and how the heart and lungs work together.     Nutrition for Pulmonary Patient:  -Group instruction provided by PowerPoint slides, verbal discussion, and written materials to support  subject matter. The instructor gives an explanation and review of healthy diet recommendations, which includes a discussion on weight management, recommendations for fruit and vegetable consumption, as well as protein, fluid, caffeine, fiber, sodium, sugar, and alcohol. Tips for eating when patients are short of breath are discussed.   Pursed Lip Breathing:  -Group instruction that is supported by demonstration and informational handouts. Instructor discusses the benefits of pursed lip and diaphragmatic breathing and detailed demonstration on how to preform both.     Oxygen Safety:  -Group instruction provided by PowerPoint, verbal discussion, and written material to support subject matter. There is an overview of "What is Oxygen" and "Why do we need it".  Instructor also reviews how to create  a safe environment for oxygen use, the importance of using oxygen as prescribed, and the risks of noncompliance. There is a brief discussion on traveling with oxygen and resources the patient may utilize.   Oxygen Equipment:  -Group instruction provided by [facility] Staff utilizing handouts, written materials, and equipment demonstrations.   Signs and Symptoms:  -Group instruction provided by written material and verbal discussion to support subject matter. Warning signs and symptoms of infection, stroke, and heart attack are reviewed and when to call the physician/911 reinforced. Tips for preventing the spread of infection discussed.   Advanced Directives:  -Group instruction provided by verbal instruction and written material to support subject matter. Instructor reviews Advanced Directive laws and proper instruction for filling out document.   Pulmonary Video:  -Group video education that reviews the importance of medication and oxygen compliance, exercise, good nutrition, pulmonary hygiene, and pursed lip and diaphragmatic breathing for the pulmonary patient.   Exercise for the Pulmonary  Patient:  -Group instruction that is supported by a PowerPoint presentation. Instructor discusses benefits of exercise, core components of exercise, frequency, duration, and intensity of an exercise routine, importance of utilizing pulse oximetry during exercise, safety while exercising, and options of places to exercise outside of rehab.     Pulmonary Medications:  -Verbally interactive group education provided by instructor with focus on inhaled medications and proper administration.   Anatomy and Physiology of the Respiratory System and Intimacy:  -Group instruction provided by PowerPoint, verbal discussion, and written material to support subject matter. Instructor reviews respiratory cycle and anatomical components of the respiratory system and their functions. Instructor also reviews differences in obstructive and restrictive respiratory diseases with examples of each. Intimacy, Sex, and Sexuality differences are reviewed with a discussion on how relationships can change when diagnosed with pulmonary disease. Common sexual concerns are reviewed.   MD DAY -A group question and answer session with a medical doctor that allows participants to ask questions that relate to their pulmonary disease state.   OTHER EDUCATION -Group or individual verbal, written, or video instructions that support the educational goals of the pulmonary rehab program.   Holiday Eating Survival Tips:  -Group instruction provided by PowerPoint slides, verbal discussion, and written materials to support subject matter. The instructor gives patients tips, tricks, and techniques to help them not only survive but enjoy the holidays despite the onslaught of food that accompanies the holidays.   Knowledge Questionnaire Score: Knowledge Questionnaire Score - 03/02/19 1101      Knowledge Questionnaire Score   Pre Score  17/18       Core Components/Risk Factors/Patient Goals at Admission: Personal Goals and Risk  Factors at Admission - 03/02/19 1009      Core Components/Risk Factors/Patient Goals on Admission   Heart Failure  Yes    Expected Outcomes  Improve functional capacity of life;Short term: Attendance in program 2-3 days a week with increased exercise capacity. Reported lower sodium intake. Reported increased fruit and vegetable intake. Reports medication compliance.;Short term: Daily weights obtained and reported for increase. Utilizing diuretic protocols set by physician.;Long term: Adoption of self-care skills and reduction of barriers for early signs and symptoms recognition and intervention leading to self-care maintenance.    Stress  Yes    Intervention  Offer individual and/or small group education and counseling on adjustment to heart disease, stress management and health-related lifestyle change. Teach and support self-help strategies.;Refer participants experiencing significant psychosocial distress to appropriate mental health specialists for further evaluation and treatment. When  possible, include family members and significant others in education/counseling sessions.       Core Components/Risk Factors/Patient Goals Review:  Goals and Risk Factor Review    Row Name 03/02/19 1013             Core Components/Risk Factors/Patient Goals Review   Personal Goals Review  Increase knowledge of respiratory medications and ability to use respiratory devices properly.;Improve shortness of breath with ADL's;Develop more efficient breathing techniques such as purse lipped breathing and diaphragmatic breathing and practicing self-pacing with activity.;Heart Failure          Core Components/Risk Factors/Patient Goals at Discharge (Final Review):  Goals and Risk Factor Review - 03/02/19 1013      Core Components/Risk Factors/Patient Goals Review   Personal Goals Review  Increase knowledge of respiratory medications and ability to use respiratory devices properly.;Improve shortness of breath  with ADL's;Develop more efficient breathing techniques such as purse lipped breathing and diaphragmatic breathing and practicing self-pacing with activity.;Heart Failure       ITP Comments: ITP Comments    Row Name 01/07/19 1442           ITP Comments  Patient was transitioned to virtual cardiac rehab program. Patient has completed use of the app.          Comments:

## 2019-03-03 ENCOUNTER — Other Ambulatory Visit (HOSPITAL_COMMUNITY)
Admission: RE | Admit: 2019-03-03 | Discharge: 2019-03-03 | Disposition: A | Discharge status: Home / Self Care | Payer: Commercial Managed Care - PPO | Source: Ambulatory Visit | Attending: Internal Medicine | Admitting: Internal Medicine

## 2019-03-03 DIAGNOSIS — Z20828 Contact with and (suspected) exposure to other viral communicable diseases: Secondary | ICD-10-CM | POA: Insufficient documentation

## 2019-03-03 DIAGNOSIS — Z01812 Encounter for preprocedural laboratory examination: Secondary | ICD-10-CM | POA: Insufficient documentation

## 2019-03-03 LAB — SARS CORONAVIRUS 2 (TAT 6-24 HRS): SARS Coronavirus 2: NEGATIVE

## 2019-03-05 ENCOUNTER — Ambulatory Visit (HOSPITAL_BASED_OUTPATIENT_CLINIC_OR_DEPARTMENT_OTHER): Payer: Commercial Managed Care - PPO | Attending: General Surgery | Admitting: Pulmonary Disease

## 2019-03-05 ENCOUNTER — Other Ambulatory Visit: Payer: Self-pay

## 2019-03-05 VITALS — Ht 60.0 in | Wt 225.0 lb

## 2019-03-05 DIAGNOSIS — R0683 Snoring: Secondary | ICD-10-CM | POA: Diagnosis not present

## 2019-03-05 DIAGNOSIS — G4733 Obstructive sleep apnea (adult) (pediatric): Secondary | ICD-10-CM | POA: Diagnosis not present

## 2019-03-05 DIAGNOSIS — E669 Obesity, unspecified: Secondary | ICD-10-CM | POA: Insufficient documentation

## 2019-03-05 DIAGNOSIS — R0902 Hypoxemia: Secondary | ICD-10-CM | POA: Diagnosis not present

## 2019-03-06 ENCOUNTER — Other Ambulatory Visit (HOSPITAL_BASED_OUTPATIENT_CLINIC_OR_DEPARTMENT_OTHER): Payer: Self-pay

## 2019-03-06 DIAGNOSIS — J9692 Respiratory failure, unspecified with hypercapnia: Secondary | ICD-10-CM

## 2019-03-07 ENCOUNTER — Telehealth: Payer: Self-pay | Admitting: Pulmonary Disease

## 2019-03-07 DIAGNOSIS — Z9989 Dependence on other enabling machines and devices: Secondary | ICD-10-CM

## 2019-03-07 DIAGNOSIS — G4733 Obstructive sleep apnea (adult) (pediatric): Secondary | ICD-10-CM

## 2019-03-07 NOTE — Telephone Encounter (Signed)
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 - Will recommend Auto-CPAP with pressure settings of 5-18cm.  DME referral Follow-up in the office in about 4 to 6 weeks following initiation of CPAP therapy

## 2019-03-07 NOTE — Procedures (Signed)
POLYSOMNOGRAPHY  Last, First: Meredith, Diaz MRN: [id number] Gender: Female Age (years): 38 Weight (lbs): 225 DOB: 1981/04/06 BMI: 44 Primary Care: No PCP Epworth Score: 10 Referring: Laurin Coder MD Technician: Baxter Flattery Interpreting: Laurin Coder MD Study Type: NPSG Ordered Study Type: Split Night CPAP Study date: 03/05/2019 Location: [place] CLINICAL INFORMATION Meredith Diaz is a 38 year old Female and was referred to the sleep center for evaluation of G47.80 Other Sleep Disorders. Indications include Obesity, Snoring, Witnesses Apnea / Gasping During Sleep.  MEDICATIONS Patient self administered medications include: N/A. Medications administered during study include No sleep medicine administered.  SLEEP STUDY TECHNIQUE A multi-channel overnight Polysomnography study was performed. The channels recorded and monitored were central and occipital EEG, electrooculogram (EOG), submentalis EMG (chin), nasal and oral airflow, thoracic and abdominal wall motion, anterior tibialis EMG, snore microphone, electrocardiogram, and a pulse oximetry. TECHNICIAN COMMENTS Comments added by Technician: PATIENT DID NOT MEET CRITERIA TO START SPLIT. PATIENT DID NOT TAKE MEDICATION WHILE HERE Comments added by Scorer: N/A SLEEP ARCHITECTURE The study was initiated at 11:06:27 PM and terminated at 5:11:47 AM. The total recorded time was 365.3 minutes. EEG confirmed total sleep time was 342 minutes yielding a sleep efficiency of 93.6%%. Sleep onset after lights out was 3.8 minutes with a REM latency of 104.5 minutes. The patient spent 1.0%% of the night in stage N1 sleep, 38.9%% in stage N2 sleep, 47.9%% in stage N3 and 12.1% in REM. Wake after sleep onset (WASO) was 19.5 minutes. The Arousal Index was 1.9/hour. RESPIRATORY PARAMETERS There were a total of 39 respiratory disturbances out of which 30 were apneas ( 30 obstructive, 0 mixed, 0 central) and 9 hypopneas. The apnea/hypopnea index  (AHI) was 6.8 events/hour. The central sleep apnea index was 0.0 events/hour. The REM AHI was 37.6 events/hour and NREM AHI was 2.6 events/hour. The supine AHI was 0.0 events/hour and the non supine AHI was 8.9 supine during 23.24% of sleep. Respiratory disturbances were associated with oxygen desaturation down to a nadir of 81.0% during sleep. The mean oxygen saturation during the study was 96.4%. The cumulative time under 88% oxygen saturation was 5.5 minutes.  LEG MOVEMENT DATA The total leg movements were 0 with a resulting leg movement index of 0.0/hr .Associated arousal with leg movement index was 0.0/hr.  CARDIAC DATA The underlying cardiac rhythm was most consistent with sinus rhythm. Mean heart rate during sleep was 79.0 bpm. Additional rhythm abnormalities include None.   IMPRESSIONS - Mild Obstructive Sleep apnea(OSA) - EKG showed no cardiac abnormalities. - Moderate Oxygen Desaturation - No snoring was audible during this study. - No significant periodic leg movements(PLMs) during sleep. However, no significant associated arousals.   DIAGNOSIS - Obstructive Sleep Apnea (327.23 [G47.33 ICD-10]) - Nocturnal Hypoxemia (327.26 [G47.36 ICD-10])   RECOMMENDATIONS - Mild obstructive sleep apnea with significant daytime sleeepiness. - Will recommend Auto-CPAP with pressure settings of 5-18cm. - Avoid alcohol, sedatives and other CNS depressants that may worsen sleep apnea and disrupt normal sleep architecture. - Sleep hygiene should be reviewed to assess factors that may improve sleep quality. - Weight management and regular exercise should be initiated or continued.  [Electronically signed] 03/07/2019 07:51 PM  Sherrilyn Rist MD NPI: [id number]

## 2019-03-09 NOTE — Telephone Encounter (Signed)
Called and spoke with Patient.  Results and recommendations given.  Patient stated she was given a sleep study to see if a bipap was more appropriate for her then cpap.  Patient is currently using cpap nightly.  Message routed to Dr. Ander Slade

## 2019-03-10 ENCOUNTER — Encounter (HOSPITAL_COMMUNITY)
Admission: RE | Admit: 2019-03-10 | Discharge: 2019-03-10 | Disposition: A | Discharge status: Home / Self Care | Payer: Commercial Managed Care - PPO | Source: Ambulatory Visit | Attending: Internal Medicine | Admitting: Internal Medicine

## 2019-03-10 ENCOUNTER — Other Ambulatory Visit: Payer: Self-pay

## 2019-03-10 VITALS — Wt 227.3 lb

## 2019-03-10 DIAGNOSIS — R0600 Dyspnea, unspecified: Secondary | ICD-10-CM | POA: Insufficient documentation

## 2019-03-10 DIAGNOSIS — I5042 Chronic combined systolic (congestive) and diastolic (congestive) heart failure: Secondary | ICD-10-CM

## 2019-03-10 NOTE — Telephone Encounter (Signed)
ATC Patient.  VM full unable to leave message. DME order for cpap settings placed. Will attempt to contact Patient again at a later time with Dr. Judson Roch recommendations to continue cpap.

## 2019-03-10 NOTE — Progress Notes (Signed)
Daily Session Note  Patient Details  Name: Meredith Diaz MRN: [id number] Date of Birth: [birth year] Referring Provider:     Pulmonary Rehab Walk Test from 03/02/2019 in [place]  Referring Provider  Dr. Chase Caller      Encounter Date: 03/10/2019  Check In: Session Check In - 03/10/19 1341      Check-In   Supervising physician immediately available to respond to emergencies  [facility] immediately available    Physician(s)  Dr. Larence Penning    Location  [facility]    Staff Present  Rosebud Poles, RN, Bjorn Loser, MS, Exercise Physiologist;Lisa Ysidro Evert, RN    Virtual Visit  No    Medication changes reported      No    Fall or balance concerns reported     No    Tobacco Cessation  No Change    Warm-up and Cool-down  Performed on first and last piece of equipment    Resistance Training Performed  Yes    VAD Patient?  No    PAD/SET Patient?  No      Pain Assessment   Currently in Pain?  No/denies    Multiple Pain Sites  No       Capillary Blood Glucose: No results found for this or any previous visit (from the past 24 hour(s)).  Exercise Prescription Changes - 03/10/19 1400      Response to Exercise   Blood Pressure (Admit)  100/60    Blood Pressure (Exercise)  132/86    Blood Pressure (Exit)  102/72    Heart Rate (Admit)  99 bpm    Heart Rate (Exercise)  118 bpm    Heart Rate (Exit)  105 bpm    Oxygen Saturation (Admit)  99 %    Oxygen Saturation (Exercise)  94 %    Oxygen Saturation (Exit)  98 %    Rating of Perceived Exertion (Exercise)  15    Perceived Dyspnea (Exercise)  3    Duration  Continue with 30 min of aerobic exercise without signs/symptoms of physical distress.    Intensity  THRR unchanged      Progression   Progression  Continue to progress workloads to maintain intensity without signs/symptoms of physical distress.      Resistance Training   Training Prescription  Yes    Weight   orange bands    Reps  10-15    Time  10 Minutes      Oxygen   Oxygen  Continuous    Liters  2      Recumbant Bike   Level  1    Minutes  15    METs  1.8      NuStep   Level  2    SPM  80    Minutes  15    METs  2       Social History   Tobacco Use  Smoking Status Never Smoker  Smokeless Tobacco Never Used    Goals Met:  Independence with exercise equipment Exercise tolerated well Strength training completed today  Goals Unmet:  Not Applicable  Comments: Service time is from 1250 to 1353    Dr. Rush Farmer is Medical Director for Pulmonary Rehab at [facility].

## 2019-03-10 NOTE — Telephone Encounter (Signed)
She can continue using CPAP  We need to get a download from her in about 3 to 4 weeks to make sure it is working well

## 2019-03-12 ENCOUNTER — Telehealth (HOSPITAL_COMMUNITY): Payer: Self-pay | Admitting: Internal Medicine

## 2019-03-12 ENCOUNTER — Encounter (HOSPITAL_COMMUNITY)
Admission: RE | Admit: 2019-03-12 | Discharge: 2019-03-12 | Disposition: A | Discharge status: Home / Self Care | Payer: Commercial Managed Care - PPO | Source: Ambulatory Visit | Attending: Internal Medicine | Admitting: Internal Medicine

## 2019-03-12 ENCOUNTER — Other Ambulatory Visit: Payer: Self-pay

## 2019-03-12 DIAGNOSIS — R0600 Dyspnea, unspecified: Secondary | ICD-10-CM | POA: Diagnosis not present

## 2019-03-12 NOTE — Progress Notes (Signed)
Meredith Diaz 38 y.o. female Nutrition Note    Past Medical History:  Diagnosis Date  . Anxiety   . Asthma   . Back pain   . Chronic combined systolic and diastolic CHF (congestive heart failure) ([place]) 07/2011   a. Prior Echo: EF was 25-30%; b. No Sig CAD on CATH; c. No significant finding on Cardiac MRI; d. follow-up Echo 2014 --  EF of 50%;  e. 03/2014 Echo: EF 40-45%, gr 1 DD; f. 11/2014 Echo: EF 40-45%, Gr 2 DD.  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  . Depression   . Dyspnea   . Essential hypertension   . Family history of breast cancer   . Family history of colon cancer   . Fibromyalgia   . Gallbladder problem   . Hypothyroidism   . IBS (irritable bowel syndrome)   . Morbid obesity ([place])   . Nonischemic cardiomyopathy ([place])    a. unclear etiology;  b. 01/2012 Cardiac MRI: no infiltrative pathology, EF 52%;  b. 11/2014 Echo: EF 40-45%, gr 2 DD.  [name] Scoliosis   . Sleep apnea   . Stomach problems   . Thyroid disease   . Vitamin B 12 deficiency   . Vitamin D deficiency      Medications reviewed.   Current Outpatient Medications:  .  albuterol (VENTOLIN HFA) 108 (90 Base) MCG/ACT inhaler, INHALE 1-2 PUFFS INTO THE LUNGS EVERY 6 (SIX) HOURS AS NEEDED FOR WHEEZING OR SHORTNESS OF BREATH., Disp: 18 Inhaler, Rfl: 3 .  budesonide-formoterol (SYMBICORT) 160-4.5 MCG/ACT inhaler, TAKE 2 PUFFS BY MOUTH TWICE A DAY, Disp: 10.2 Inhaler, Rfl: 5 .  clobetasol cream (TEMOVATE) 3.22 %, Apply 1 application topically See admin instructions. Apply daily to affected areas of genital area as directed, Disp: , Rfl:  .  clonazePAM (KLONOPIN) 1 MG tablet, TAKE 0.5-1 TABLET BY MOUTH TWICE DAILY (Patient not taking: Reported on 03/02/2019), Disp: 60 tablet, Rfl: 0 .  ipratropium-albuterol (DUONEB) 0.5-2.5 (3) MG/3ML SOLN, Take 3 mLs by  nebulization 2 (two) times daily as needed (For wheezing or shoortness of breath). (Patient taking differently: Take 3 mLs by nebulization 2 (two) times daily as needed (For wheezing or shortness of breath). ), Disp: 180 mL, Rfl: 5 .  levothyroxine (SYNTHROID, LEVOTHROID) 125 MCG tablet, Take 1 tablet (125 mcg total) by mouth daily., Disp: 90 tablet, Rfl: 2 .  metoprolol succinate (TOPROL-XL) 25 MG 24 hr tablet, Take 1 tablet (25 mg total) by mouth at bedtime., Disp: 30 tablet, Rfl: 6 .  potassium chloride 20 MEQ TBCR, Take 20 mEq by mouth 2 (two) times daily., Disp: 180 tablet, Rfl: 0 .  spironolactone (ALDACTONE) 25 MG tablet, TAKE 1 TABLET BY MOUTH EVERY DAY, Disp: 30 tablet, Rfl: 11 .  torsemide (DEMADEX) 20 MG tablet, Take 4 tablets (80 mg total) by mouth every morning AND 3 tablets (60 mg total) at bedtime., Disp: 630 tablet, Rfl: 3 .  valsartan (DIOVAN) 80 MG tablet, Take 1 tablet (80 mg total) by mouth daily., Disp: 60 tablet, Rfl: 6 .  vortioxetine HBr (TRINTELLIX) 10 MG TABS tablet, Take 1 tablet (10 mg total) by mouth daily., Disp: 90 tablet, Rfl: 1   Ht Readings from Last 1 Encounters:  03/05/19 5' (1.524 m)     Wt Readings from Last 3 Encounters:  03/10/19 227  lb 4.7 oz (103.1 kg)  03/05/19 225 lb (102.1 kg)  03/02/19 229 lb 11.5 oz (104.2 kg)     There is no height or weight on file to calculate BMI.   Social History   Tobacco Use  Smoking Status Never Smoker  Smokeless Tobacco Never Used     Lab Results  Component Value Date   CHOL 197 08/07/2017   Lab Results  Component Value Date   HDL 49 08/07/2017   Lab Results  Component Value Date   LDLCALC 124 (H) 08/07/2017   Lab Results  Component Value Date   TRIG 122 08/07/2017   Lab Results  Component Value Date   CHOLHDL 4 01/28/2014     Lab Results  Component Value Date   HGBA1C 6.4 04/16/2018     CBG (last 3)  No results for input(s): GLUCAP in the last 72 hours.   Nutrition Note  Spoke with  pt. Nutrition Plan and Nutrition Survey goals reviewed with pt. Pt is following a Heart Healthy diet. Pt with complicated weight loss history. 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 Pt has never received treatment. Pt has tried many different diets and weight loss programs resulting in weight cycling. Encouraged pt to see an eating disorder counselor or dietitian. Will f/u with recommendations.  Pt has Pre-diabetes. Last A1c indicates blood glucose well-controlled. This Clinical research associate discussed fiber rich carbs. Pt interested in trying new whole grains (pasta, brown rice, quinoa, oats etc).  Pt with dx of CHF. Per discussion, pt does use canned/convenience foods often. Pt does not add salt to food. Pt does not eat out frequently.   Pt expressed understanding of the information reviewed.   Nutrition Diagnosis ? Food-and nutrition-related knowledge deficit related to lack of exposure to information as related to diagnosis of: ? CVD ? Pre-diabetes  Nutrition Intervention ? Pt's individual nutrition plan reviewed with pt. ? Benefits of adopting Heart Healthy diet discussed when Medficts reviewed.   ? Continue client-centered nutrition education by RD, as part of interdisciplinary care.  Goal(s) ? Pt to increase whole grains  ? Pt to continue limiting sodium to 1500 mg/day  Plan:   Will provide client-centered nutrition education as part of interdisciplinary care  Monitor and evaluate progress toward nutrition goal with team.   [name], MS, RDN, LDN

## 2019-03-12 NOTE — Progress Notes (Signed)
Daily Session Note  Patient Details  Name: Meredith Diaz MRN: [id number] Date of Birth: 14-May-1980 Referring Provider:     Pulmonary Rehab Walk Test from 03/02/2019 in [place]  Referring Provider  Dr. Chase Caller      Encounter Date: 03/12/2019  Check In: Session Check In - 03/12/19 1315      Check-In   Supervising physician immediately available to respond to emergencies  [facility] immediately available    Physician(s)  Dr. Tawanna Solo    Location  [facility]    Staff Present  Rosebud Poles, RN, Bjorn Loser, MS, Exercise Physiologist;Stacyann Mcconaughy Ysidro Evert, RN    Virtual Visit  No    Medication changes reported      No    Fall or balance concerns reported     No    Tobacco Cessation  No Change    Warm-up and Cool-down  Performed as group-led instruction    Resistance Training Performed  Yes    VAD Patient?  No    PAD/SET Patient?  No      Pain Assessment   Currently in Pain?  No/denies    Multiple Pain Sites  No       Capillary Blood Glucose: No results found for this or any previous visit (from the past 24 hour(s)).    Social History   Tobacco Use  Smoking Status Never Smoker  Smokeless Tobacco Never Used    Goals Met:  Exercise tolerated well No report of cardiac concerns or symptoms Strength training completed today  Goals Unmet:  Not Applicable  Comments: Service time is from 1250 to 1345    Dr. Rush Farmer is Medical Director for Pulmonary Rehab at [facility].

## 2019-03-16 NOTE — Telephone Encounter (Signed)
Called and spoke with Patient.  Dr. Olalere's recommendations given.  Understanding stated.  Nothing further at this time. 

## 2019-03-17 ENCOUNTER — Encounter (HOSPITAL_COMMUNITY)
Admission: RE | Admit: 2019-03-17 | Discharge: 2019-03-17 | Disposition: A | Discharge status: Home / Self Care | Payer: Commercial Managed Care - PPO | Source: Ambulatory Visit | Attending: Internal Medicine | Admitting: Internal Medicine

## 2019-03-17 ENCOUNTER — Other Ambulatory Visit: Payer: Self-pay

## 2019-03-17 DIAGNOSIS — R0600 Dyspnea, unspecified: Secondary | ICD-10-CM | POA: Diagnosis not present

## 2019-03-17 DIAGNOSIS — I5042 Chronic combined systolic (congestive) and diastolic (congestive) heart failure: Secondary | ICD-10-CM

## 2019-03-17 NOTE — Progress Notes (Signed)
Daily Session Note  Patient Details  Name: Meredith Diaz MRN: [id number] Date of Birth: 05/15/1980 Referring Provider:     Pulmonary Rehab Walk Test from 03/02/2019 in [place]  Referring Provider  Dr. Chase Caller      Encounter Date: 03/17/2019  Check In: Session Check In - 03/17/19 1439      Check-In   Supervising physician immediately available to respond to emergencies  [facility] immediately available    Physician(s)  Dr. Larence Penning    Location  [facility]    Staff Present  Hoy Register, MS, Exercise Physiologist;Lisa Ysidro Evert, RN;Joan Leonia Reeves, RN, BSN    Virtual Visit  No    Medication changes reported      No    Fall or balance concerns reported     No    Tobacco Cessation  No Change    Warm-up and Cool-down  Performed on first and last piece of equipment    Resistance Training Performed  Yes    VAD Patient?  No    PAD/SET Patient?  No      Pain Assessment   Currently in Pain?  No/denies    Multiple Pain Sites  No       Capillary Blood Glucose: No results found for this or any previous visit (from the past 24 hour(s)).    Social History   Tobacco Use  Smoking Status Never Smoker  Smokeless Tobacco Never Used    Goals Met:  Independence with exercise equipment Exercise tolerated well Strength training completed today  Goals Unmet:  Not Applicable  Comments: Service time is from 1245 to 1400    Dr. Rush Farmer is Medical Director for Pulmonary Rehab at [facility].

## 2019-03-17 NOTE — Progress Notes (Addendum)
Nutrition Note  Spoke with pt about managing blood sugars for Prediabetes. Last A1C 6.09 April 2018. Pt aware of A1C and what that means. Diet recall performed and reveals pt eat balanced meals most of the time and avoids sugary beverages but she typically chooses refined carbohydrates. Fiber and whole grain education reviewed. Collaborated for goal setting. Pt to try new whole grains each week and add beans/veggies/fruit daily. Pt verbalized understanding. Provided handouts and recipes. Will Continue monitoring and evaluating with one on one RD follow up.  Michaele Offer, MS, RDN, LDN

## 2019-03-19 ENCOUNTER — Encounter (HOSPITAL_COMMUNITY)
Admission: RE | Admit: 2019-03-19 | Discharge: 2019-03-19 | Disposition: A | Discharge status: Home / Self Care | Payer: Commercial Managed Care - PPO | Source: Ambulatory Visit | Attending: Internal Medicine | Admitting: Internal Medicine

## 2019-03-19 ENCOUNTER — Other Ambulatory Visit: Payer: Self-pay

## 2019-03-19 DIAGNOSIS — R0600 Dyspnea, unspecified: Secondary | ICD-10-CM

## 2019-03-19 NOTE — Progress Notes (Signed)
Daily Session Note  Patient Details  Name: Meredith Diaz MRN: [id number] Date of Birth: [birth year] Referring Provider:     Pulmonary Rehab Walk Test from 03/02/2019 in [place]  Referring Provider  Dr. Chase Caller      Encounter Date: 03/19/2019  Check In: Session Check In - 03/19/19 1254      Check-In   Supervising physician immediately available to respond to emergencies  [facility] immediately available    Physician(s)  Dr. Denton Brick    Location  [facility]    Staff Present  Rosebud Poles, RN, Bjorn Loser, MS, Exercise Physiologist    Virtual Visit  No    Medication changes reported      No    Fall or balance concerns reported     No    Tobacco Cessation  No Change    Warm-up and Cool-down  Performed on first and last piece of equipment    Resistance Training Performed  Yes    VAD Patient?  No    PAD/SET Patient?  No      Pain Assessment   Currently in Pain?  No/denies    Pain Score  0-No pain    Multiple Pain Sites  No       Capillary Blood Glucose: No results found for this or any previous visit (from the past 24 hour(s)).    Social History   Tobacco Use  Smoking Status Never Smoker  Smokeless Tobacco Never Used    Goals Met:  Independence with exercise equipment Exercise tolerated well Strength training completed today  Goals Unmet:  Not Applicable  Comments: Service time is from 1245 to 1348    Dr. Rush Farmer is Medical Director for Pulmonary Rehab at [facility].

## 2019-03-24 ENCOUNTER — Other Ambulatory Visit: Payer: Self-pay

## 2019-03-24 ENCOUNTER — Encounter (HOSPITAL_COMMUNITY)
Admission: RE | Admit: 2019-03-24 | Discharge: 2019-03-24 | Disposition: A | Discharge status: Home / Self Care | Payer: Commercial Managed Care - PPO | Source: Ambulatory Visit | Attending: Internal Medicine | Admitting: Internal Medicine

## 2019-03-24 VITALS — Wt 225.8 lb

## 2019-03-24 DIAGNOSIS — R0600 Dyspnea, unspecified: Secondary | ICD-10-CM | POA: Diagnosis not present

## 2019-03-24 NOTE — Progress Notes (Signed)
Daily Session Note  Patient Details  Name: Meredith Diaz MRN: [id number] Date of Birth: 06/25/1980 Referring Provider:     Pulmonary Rehab Walk Test from 03/02/2019 in [facility]  Referring Provider  Dr. Ramaswamy      Encounter Date: 03/24/2019  Check In: Session Check In - 03/24/19 1459      Check-In   Supervising physician immediately available to respond to emergencies  [facility] immediately available    Physician(s)  Dr. Pahwani    Staff Present  Joan Behrens, RN, BSN;Dalton Fletcher, MS, Exercise Physiologist;Lisa Hughes, RN    Virtual Visit  No    Medication changes reported      No    Fall or balance concerns reported     No    Tobacco Cessation  No Change    Warm-up and Cool-down  Performed as group-led instruction    Resistance Training Performed  Yes    VAD Patient?  No    PAD/SET Patient?  No      Pain Assessment   Currently in Pain?  No/denies    Multiple Pain Sites  No       Capillary Blood Glucose: No results found for this or any previous visit (from the past 24 hour(s)).  Exercise Prescription Changes - 03/24/19 1500      Response to Exercise   Blood Pressure (Admit)  106/70    Blood Pressure (Exercise)  120/80    Blood Pressure (Exit)  106/88    Heart Rate (Admit)  112 bpm    Heart Rate (Exercise)  138 bpm    Heart Rate (Exit)  113 bpm    Oxygen Saturation (Admit)  97 %    Oxygen Saturation (Exercise)  95 %    Oxygen Saturation (Exit)  96 %    Rating of Perceived Exertion (Exercise)  17    Perceived Dyspnea (Exercise)  2    Duration  Continue with 30 min of aerobic exercise without signs/symptoms of physical distress.    Intensity  THRR unchanged      Progression   Progression  Continue to progress workloads to maintain intensity without signs/symptoms of physical distress.      Resistance Training   Training Prescription  Yes    Weight  orange bands    Reps  10-15    Time  10 Minutes      Oxygen   Oxygen  Continuous    Liters  2      Recumbant Bike   Level  1    Minutes  15      NuStep   Level  2    SPM  80    Minutes  15    METs  1.8       Social History   Tobacco Use  Smoking Status Never Smoker  Smokeless Tobacco Never Used    Goals Met:  Exercise tolerated well No report of cardiac concerns or symptoms Strength training completed today  Goals Unmet:  Not Applicable  Comments: Service time is from 1255 to 1401    Dr. Wesam G. Yacoub is Medical Director for Pulmonary Rehab at [facility]. 

## 2019-03-24 NOTE — Progress Notes (Signed)
Pulmonary Individual Treatment Plan  Patient Details  Name: Meredith Diaz MRN: [id number] Date of Birth: December 12, 1980 Referring Provider:     Pulmonary Rehab Walk Test from 03/02/2019 in [place]  Referring Provider  Dr. Chase Caller      Initial Encounter Date:    Pulmonary Rehab Walk Test from 03/02/2019 in [place]  Date  03/02/19      Visit Diagnosis: Dyspnea, unspecified type  Patient's Home Medications on Admission:   Current Outpatient Medications:  .  albuterol (VENTOLIN HFA) 108 (90 Base) MCG/ACT inhaler, INHALE 1-2 PUFFS INTO THE LUNGS EVERY 6 (SIX) HOURS AS NEEDED FOR WHEEZING OR SHORTNESS OF BREATH., Disp: 18 Inhaler, Rfl: 3 .  budesonide-formoterol (SYMBICORT) 160-4.5 MCG/ACT inhaler, TAKE 2 PUFFS BY MOUTH TWICE A DAY, Disp: 10.2 Inhaler, Rfl: 5 .  clobetasol cream (TEMOVATE) 2.09 %, Apply 1 application topically See admin instructions. Apply daily to affected areas of genital area as directed, Disp: , Rfl:  .  clonazePAM (KLONOPIN) 1 MG tablet, TAKE 0.5-1 TABLET BY MOUTH TWICE DAILY (Patient not taking: Reported on 03/02/2019), Disp: 60 tablet, Rfl: 0 .  ipratropium-albuterol (DUONEB) 0.5-2.5 (3) MG/3ML SOLN, Take 3 mLs by nebulization 2 (two) times daily as needed (For wheezing or shoortness of breath). (Patient taking differently: Take 3 mLs by nebulization 2 (two) times daily as needed (For wheezing or shortness of breath). ), Disp: 180 mL, Rfl: 5 .  levothyroxine (SYNTHROID, LEVOTHROID) 125 MCG tablet, Take 1 tablet (125 mcg total) by mouth daily., Disp: 90 tablet, Rfl: 2 .  metoprolol succinate (TOPROL-XL) 25 MG 24 hr tablet, Take 1 tablet (25 mg total) by mouth at bedtime., Disp: 30 tablet, Rfl: 6 .  potassium chloride 20 MEQ TBCR, Take 20 mEq by mouth 2 (two) times daily., Disp: 180 tablet, Rfl: 0 .  spironolactone (ALDACTONE) 25 MG tablet, TAKE 1 TABLET BY MOUTH EVERY DAY, Disp: 30 tablet, Rfl:  11 .  torsemide (DEMADEX) 20 MG tablet, Take 4 tablets (80 mg total) by mouth every morning AND 3 tablets (60 mg total) at bedtime., Disp: 630 tablet, Rfl: 3 .  valsartan (DIOVAN) 80 MG tablet, Take 1 tablet (80 mg total) by mouth daily., Disp: 60 tablet, Rfl: 6 .  vortioxetine HBr (TRINTELLIX) 10 MG TABS tablet, Take 1 tablet (10 mg total) by mouth daily., Disp: 90 tablet, Rfl: 1  Past Medical History: Past Medical History:  Diagnosis Date  . Anxiety   . Asthma   . Back pain   . Chronic combined systolic and diastolic CHF (congestive heart failure) ([place]) 07/2011   a. Prior Echo: EF was 25-30%; b. No Sig CAD on CATH; c. No significant finding on Cardiac MRI; d. follow-up Echo 2014 --  EF of 50%;  e. 03/2014 Echo: EF 40-45%, gr 1 DD; f. 11/2014 Echo: EF 40-45%, Gr 2 DD.  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  . Depression   . Dyspnea   . Essential hypertension   . Family history of breast cancer   . Family history of colon cancer   . Fibromyalgia   . Gallbladder problem   . Hypothyroidism   . IBS (irritable bowel syndrome)   . Morbid obesity ([place])   . Nonischemic cardiomyopathy ([place])    a. unclear etiology;  b. 01/2012  Cardiac MRI: no infiltrative pathology, EF 52%;  b. 11/2014 Echo: EF 40-45%, gr 2 DD.  [name] Scoliosis   . Sleep apnea   . Stomach problems   . Thyroid disease   . Vitamin B 12 deficiency   . Vitamin D deficiency     Tobacco Use: Social History   Tobacco Use  Smoking Status Never Smoker  Smokeless Tobacco Never Used    Labs: Recent Review Flowsheet Data    Labs for ITP Cardiac and Pulmonary Rehab Latest Ref Rng & Units 08/07/2017 04/16/2018 07/10/2018 07/10/2018 02/19/2019   Cholestrol 100 - 199 mg/dL 818 - - - -   LDLCALC 0 - 99 mg/dL 299(B) - - - -   HDL >71 mg/dL 49 - - - -   Trlycerides 0 - 149 mg/dL 696 -  - - -   Hemoglobin A1c 4.6 - 6.5 % 5.9(H) 6.4 - - -   PHART 7.350 - 7.450 - - - - 7.409   PCO2ART 32.0 - 48.0 mmHg - - - - 57.4(H)   HCO3 20.0 - 28.0 mmol/L - - 39.2(H) 37.3(H) 35.5(H)   TCO2 22 - 32 mmol/L - - 42(H) 40(H) -   O2SAT % - - 67.0 67.0 97.8      Capillary Blood Glucose: Lab Results  Component Value Date   GLUCAP 155 (H) 01/27/2015     Pulmonary Assessment Scores: Pulmonary Assessment Scores    Row Name 03/02/19 1055 03/02/19 1101       ADL UCSD   ADL Phase  Entry  Entry    SOB Score total  -  73      CAT Score   CAT Score  -  31      mMRC Score   mMRC Score  4  -      UCSD: Self-administered rating of dyspnea associated with activities of daily living (ADLs) 6-point scale (0 = "not at all" to 5 = "maximal or unable to do because of breathlessness")  Scoring Scores range from 0 to 120.  Minimally important difference is 5 units  CAT: CAT can identify the health impairment of COPD patients and is better correlated with disease progression.  CAT has a scoring range of zero to 40. The CAT score is classified into four groups of low (less than 10), medium (10 - 20), high (21-30) and very high (31-40) based on the impact level of disease on health status. A CAT score over 10 suggests significant symptoms.  A worsening CAT score could be explained by an exacerbation, poor medication adherence, poor inhaler technique, or progression of COPD or comorbid conditions.  CAT MCID is 2 points  mMRC: mMRC (Modified Medical Research Council) Dyspnea Scale is used to assess the degree of baseline functional disability in patients of respiratory disease due to dyspnea. No minimal important difference is established. A decrease in score of 1 point or greater is considered a positive change.   Pulmonary Function Assessment: Pulmonary Function Assessment - 03/02/19 0957      Breath   Bilateral Breath Sounds  Clear    Shortness of Breath  Panic with Shortness of  Breath;Limiting activity;Fear of Shortness of Breath;Yes       Exercise Target Goals: Exercise Program Goal: Individual exercise prescription set using results from initial 6 min walk test and THRR while considering  patient's activity barriers and safety.   Exercise Prescription Goal: Initial exercise prescription builds to 30-45 minutes a day of aerobic activity, 2-3 days per  week.  Home exercise guidelines will be given to patient during program as part of exercise prescription that the participant will acknowledge.  Activity Barriers & Risk Stratification:   6 Minute Walk: 6 Minute Walk    Row Name 03/02/19 1056         6 Minute Walk   Phase  Initial     Distance  861 feet     Walk Time  5.17 minutes     # of Rest Breaks  2 2 standing rest breaks. One was 35 sec and the other 15 sec     MPH  1.63     METS  2.95     RPE  17     Perceived Dyspnea   3     VO2 Peak  10.32     Symptoms  Yes (comment)     Comments  desaturated on 2L to 83%, saturations maintained at 90% and above on 3L     Resting HR  76 bpm     Resting BP  104/68     Resting Oxygen Saturation   98 %     Exercise Oxygen Saturation  during 6 min walk  83 %     Max Ex. HR  115 bpm     Max Ex. BP  122/80     2 Minute Post BP  110/82       Interval HR   1 Minute HR  95     2 Minute HR  106     3 Minute HR  110     4 Minute HR  115     5 Minute HR  114     6 Minute HR  107     2 Minute Post HR  107     Interval Heart Rate?  Yes       Interval Oxygen   Interval Oxygen?  Yes     Baseline Oxygen Saturation %  98 %     1 Minute Oxygen Saturation %  83 %     1 Minute Liters of Oxygen  2 L     2 Minute Oxygen Saturation %  94 %     2 Minute Liters of Oxygen  3 L     3 Minute Oxygen Saturation %  90 %     3 Minute Liters of Oxygen  3 L     4 Minute Oxygen Saturation %  97 %     4 Minute Liters of Oxygen  3 L     5 Minute Oxygen Saturation %  95 %     5 Minute Liters of Oxygen  3 L     6 Minute Oxygen  Saturation %  99 %     6 Minute Liters of Oxygen  3 L     2 Minute Post Oxygen Saturation %  95 %     2 Minute Post Liters of Oxygen  2 L        Oxygen Initial Assessment: Oxygen Initial Assessment - 03/02/19 1054      Home Oxygen   Home Oxygen Device  Home Concentrator;Portable Concentrator;E-Tanks    Sleep Oxygen Prescription  Continuous    Liters per minute  2    Home Exercise Oxygen Prescription  Pulsed    Home at Rest Exercise Oxygen Prescription  Continuous    Liters per minute  2    Compliance with Home Oxygen Use  Yes  Initial 6 min Walk   Oxygen Used  E-Tanks    Liters per minute  3   desat on 2L     Program Oxygen Prescription   Program Oxygen Prescription  Continuous    Liters per minute  3      Intervention   Short Term Goals  To learn and exhibit compliance with exercise, home and travel O2 prescription;To learn and understand importance of monitoring SPO2 with pulse oximeter and demonstrate accurate use of the pulse oximeter.;To learn and understand importance of maintaining oxygen saturations>88%;To learn and demonstrate proper pursed lip breathing techniques or other breathing techniques.;To learn and demonstrate proper use of respiratory medications    Long  Term Goals  Compliance with respiratory medication;Exhibits proper breathing techniques, such as pursed lip breathing or other method taught during program session;Maintenance of O2 saturations>88%;Verbalizes importance of monitoring SPO2 with pulse oximeter and return demonstration;Exhibits compliance with exercise, home and travel O2 prescription       Oxygen Re-Evaluation: Oxygen Re-Evaluation    Row Name 03/24/19 0713             Program Oxygen Prescription   Program Oxygen Prescription  Continuous       Liters per minute  2       Comments  Pt only does seated exercise, and has been able to maintain sats with 2L.         Home Oxygen   Home Oxygen Device  Home Concentrator;Portable  Concentrator;E-Tanks       Sleep Oxygen Prescription  Continuous       Liters per minute  2       Home Exercise Oxygen Prescription  Pulsed       Liters per minute  3       Home at Rest Exercise Oxygen Prescription  Pulsed       Liters per minute  2       Compliance with Home Oxygen Use  Yes         Goals/Expected Outcomes   Short Term Goals  To learn and exhibit compliance with exercise, home and travel O2 prescription;To learn and understand importance of monitoring SPO2 with pulse oximeter and demonstrate accurate use of the pulse oximeter.;To learn and understand importance of maintaining oxygen saturations>88%;To learn and demonstrate proper pursed lip breathing techniques or other breathing techniques.;To learn and demonstrate proper use of respiratory medications       Long  Term Goals  Compliance with respiratory medication;Exhibits proper breathing techniques, such as pursed lip breathing or other method taught during program session;Maintenance of O2 saturations>88%;Verbalizes importance of monitoring SPO2 with pulse oximeter and return demonstration;Exhibits compliance with exercise, home and travel O2 prescription       Goals/Expected Outcomes  compliance          Oxygen Discharge (Final Oxygen Re-Evaluation): Oxygen Re-Evaluation - 03/24/19 0713      Program Oxygen Prescription   Program Oxygen Prescription  Continuous    Liters per minute  2    Comments  Pt only does seated exercise, and has been able to maintain sats with 2L.      Home Oxygen   Home Oxygen Device  Home Concentrator;Portable Concentrator;E-Tanks    Sleep Oxygen Prescription  Continuous    Liters per minute  2    Home Exercise Oxygen Prescription  Pulsed    Liters per minute  3    Home at Rest Exercise Oxygen Prescription  Pulsed    Liters per minute  2    Compliance with Home Oxygen Use  Yes      Goals/Expected Outcomes   Short Term Goals  To learn and exhibit compliance with exercise, home and  travel O2 prescription;To learn and understand importance of monitoring SPO2 with pulse oximeter and demonstrate accurate use of the pulse oximeter.;To learn and understand importance of maintaining oxygen saturations>88%;To learn and demonstrate proper pursed lip breathing techniques or other breathing techniques.;To learn and demonstrate proper use of respiratory medications    Long  Term Goals  Compliance with respiratory medication;Exhibits proper breathing techniques, such as pursed lip breathing or other method taught during program session;Maintenance of O2 saturations>88%;Verbalizes importance of monitoring SPO2 with pulse oximeter and return demonstration;Exhibits compliance with exercise, home and travel O2 prescription    Goals/Expected Outcomes  compliance       Initial Exercise Prescription: Initial Exercise Prescription - 03/02/19 1100      Date of Initial Exercise RX and Referring Provider   Date  03/02/19    Referring Provider  Dr. Marchelle Gearing      Oxygen   Oxygen  Continuous    Liters  3      Recumbant Bike   Level  1    Minutes  15    METs  2      NuStep   Level  2    SPM  80    Minutes  15      Prescription Details   Frequency (times per week)  2    Duration  Progress to 30 minutes of continuous aerobic without signs/symptoms of physical distress      Intensity   THRR 40-80% of Max Heartrate  72-145    Ratings of Perceived Exertion  11-13    Perceived Dyspnea  0-4      Progression   Progression  Continue to progress workloads to maintain intensity without signs/symptoms of physical distress.      Resistance Training   Training Prescription  Yes    Weight  orange bands    Reps  10-15       Perform Capillary Blood Glucose checks as needed.  Exercise Prescription Changes: Exercise Prescription Changes    Row Name 03/10/19 1400 03/24/19 1500           Response to Exercise   Blood Pressure (Admit)  100/60  106/70      Blood Pressure (Exercise)   132/86  120/80      Blood Pressure (Exit)  102/72  106/88      Heart Rate (Admit)  99 bpm  112 bpm      Heart Rate (Exercise)  118 bpm  138 bpm      Heart Rate (Exit)  105 bpm  113 bpm      Oxygen Saturation (Admit)  99 %  97 %      Oxygen Saturation (Exercise)  94 %  95 %      Oxygen Saturation (Exit)  98 %  96 %      Rating of Perceived Exertion (Exercise)  15  17      Perceived Dyspnea (Exercise)  3  2      Duration  Continue with 30 min of aerobic exercise without signs/symptoms of physical distress.  Continue with 30 min of aerobic exercise without signs/symptoms of physical distress.      Intensity  THRR unchanged  THRR unchanged        Progression   Progression  Continue to progress workloads  to maintain intensity without signs/symptoms of physical distress.  Continue to progress workloads to maintain intensity without signs/symptoms of physical distress.        Resistance Training   Training Prescription  Yes  Yes      Weight  orange bands  orange bands      Reps  10-15  10-15      Time  10 Minutes  10 Minutes        Oxygen   Oxygen  Continuous  Continuous      Liters  2  2        Recumbant Bike   Level  1  1      Minutes  15  15      METs  1.8  -        NuStep   Level  2  2      SPM  80  80      Minutes  15  15      METs  2  1.8         Exercise Comments:   Exercise Goals and Review: Exercise Goals    Row Name 03/02/19 1100 03/24/19 0715           Exercise Goals   Increase Physical Activity  Yes  Yes      Intervention  Provide advice, education, support and counseling about physical activity/exercise needs.;Develop an individualized exercise prescription for aerobic and resistive training based on initial evaluation findings, risk stratification, comorbidities and participant's personal goals.  Provide advice, education, support and counseling about physical activity/exercise needs.;Develop an individualized exercise prescription for aerobic and resistive  training based on initial evaluation findings, risk stratification, comorbidities and participant's personal goals.      Expected Outcomes  Short Term: Attend rehab on a regular basis to increase amount of physical activity.;Long Term: Add in home exercise to make exercise part of routine and to increase amount of physical activity.;Long Term: Exercising regularly at least 3-5 days a week.  Short Term: Attend rehab on a regular basis to increase amount of physical activity.;Long Term: Add in home exercise to make exercise part of routine and to increase amount of physical activity.;Long Term: Exercising regularly at least 3-5 days a week.      Increase Strength and Stamina  Yes  Yes      Intervention  Provide advice, education, support and counseling about physical activity/exercise needs.;Develop an individualized exercise prescription for aerobic and resistive training based on initial evaluation findings, risk stratification, comorbidities and participant's personal goals.  Provide advice, education, support and counseling about physical activity/exercise needs.;Develop an individualized exercise prescription for aerobic and resistive training based on initial evaluation findings, risk stratification, comorbidities and participant's personal goals.      Expected Outcomes  Short Term: Increase workloads from initial exercise prescription for resistance, speed, and METs.  Short Term: Increase workloads from initial exercise prescription for resistance, speed, and METs.      Able to understand and use rate of perceived exertion (RPE) scale  Yes  Yes      Intervention  Provide education and explanation on how to use RPE scale  Provide education and explanation on how to use RPE scale      Expected Outcomes  Short Term: Able to use RPE daily in rehab to express subjective intensity level;Long Term:  Able to use RPE to guide intensity level when exercising independently  Short Term: Able to use RPE daily in  rehab  to express subjective intensity level;Long Term:  Able to use RPE to guide intensity level when exercising independently      Able to understand and use Dyspnea scale  Yes  Yes      Intervention  Provide education and explanation on how to use Dyspnea scale  Provide education and explanation on how to use Dyspnea scale      Expected Outcomes  Short Term: Able to use Dyspnea scale daily in rehab to express subjective sense of shortness of breath during exertion;Long Term: Able to use Dyspnea scale to guide intensity level when exercising independently  Short Term: Able to use Dyspnea scale daily in rehab to express subjective sense of shortness of breath during exertion;Long Term: Able to use Dyspnea scale to guide intensity level when exercising independently      Knowledge and understanding of Target Heart Rate Range (THRR)  Yes  Yes      Intervention  Provide education and explanation of THRR including how the numbers were predicted and where they are located for reference  Provide education and explanation of THRR including how the numbers were predicted and where they are located for reference      Expected Outcomes  Short Term: Able to state/look up THRR;Long Term: Able to use THRR to govern intensity when exercising independently;Short Term: Able to use daily as guideline for intensity in rehab  Short Term: Able to state/look up THRR;Long Term: Able to use THRR to govern intensity when exercising independently;Short Term: Able to use daily as guideline for intensity in rehab      Understanding of Exercise Prescription  Yes  Yes      Intervention  Provide education, explanation, and written materials on patient's individual exercise prescription  Provide education, explanation, and written materials on patient's individual exercise prescription      Expected Outcomes  Short Term: Able to explain program exercise prescription;Long Term: Able to explain home exercise prescription to exercise  independently  Short Term: Able to explain program exercise prescription;Long Term: Able to explain home exercise prescription to exercise independently         Exercise Goals Re-Evaluation : Exercise Goals Re-Evaluation    Row Name 03/24/19 0715             Exercise Goal Re-Evaluation   Exercise Goals Review  Increase Physical Activity;Increase Strength and Stamina;Able to understand and use rate of perceived exertion (RPE) scale;Able to understand and use Dyspnea scale;Knowledge and understanding of Target Heart Rate Range (THRR);Understanding of Exercise Prescription       Comments  Pt has attended 4 exercise sessions. Pt was severely deconditioned upon entering the program. Pt has a very positive outlook and is motivated to make progress. Pt currently exercises at 1.9 METs on the stepper. Will continue to monitor and progress as able.       Expected Outcomes  Through exercise at rehab and at home, the patient will decrease shortness of breath with daily activities and feel confident in carrying out an exercise regime at home.          Discharge Exercise Prescription (Final Exercise Prescription Changes): Exercise Prescription Changes - 03/24/19 1500      Response to Exercise   Blood Pressure (Admit)  106/70    Blood Pressure (Exercise)  120/80    Blood Pressure (Exit)  106/88    Heart Rate (Admit)  112 bpm    Heart Rate (Exercise)  138 bpm    Heart Rate (Exit)  113  bpm    Oxygen Saturation (Admit)  97 %    Oxygen Saturation (Exercise)  95 %    Oxygen Saturation (Exit)  96 %    Rating of Perceived Exertion (Exercise)  17    Perceived Dyspnea (Exercise)  2    Duration  Continue with 30 min of aerobic exercise without signs/symptoms of physical distress.    Intensity  THRR unchanged      Progression   Progression  Continue to progress workloads to maintain intensity without signs/symptoms of physical distress.      Resistance Training   Training Prescription  Yes    Weight   orange bands    Reps  10-15    Time  10 Minutes      Oxygen   Oxygen  Continuous    Liters  2      Recumbant Bike   Level  1    Minutes  15      NuStep   Level  2    SPM  80    Minutes  15    METs  1.8       Nutrition:  Target Goals: Understanding of nutrition guidelines, daily intake of sodium 1500mg , cholesterol 200mg , calories 30% from fat and 7% or less from saturated fats, daily to have 5 or more servings of fruits and vegetables.  Biometrics: Pre Biometrics - 03/02/19 0952      Pre Biometrics   Height  5' (1.524 m)    Weight  104.2 kg    BMI (Calculated)  44.86    Grip Strength  25 kg        Nutrition Therapy Plan and Nutrition Goals: Nutrition Therapy & Goals - 03/12/19 1445      Nutrition Therapy   Diet  Mediterranean/Low Sodium      Personal Nutrition Goals   Nutrition Goal  Pt to increase whole grains vs refined grains for better blood sugar control    Personal Goal #2  Pt to continue limiting sodium to 1500 mg/day    Comments  Pt with hx of eating disorder - do not recommend restrictive diets      Intervention Plan   Expected Outcomes  Short Term Goal: A plan has been developed with personal nutrition goals set during dietitian appointment.;Long Term Goal: Adherence to prescribed nutrition plan.       Nutrition Assessments: Nutrition Assessments - 03/12/19 1503      Rate Your Plate Scores   Pre Score  60       Nutrition Goals Re-Evaluation: Nutrition Goals Re-Evaluation    Row Name 03/12/19 1455             Goals   Current Weight  229 lb 11.5 oz (104.2 kg)       Nutrition Goal  Pt to increase whole grains vs refined grains for better blood sugar control         Personal Goal #2 Re-Evaluation   Personal Goal #2  Pt to continue limiting sodium to 1500 mg/day          Nutrition Goals Discharge (Final Nutrition Goals Re-Evaluation): Nutrition Goals Re-Evaluation - 03/12/19 1455      Goals   Current Weight  229 lb 11.5 oz (104.2  kg)    Nutrition Goal  Pt to increase whole grains vs refined grains for better blood sugar control      Personal Goal #2 Re-Evaluation   Personal Goal #2  Pt to continue limiting sodium  to 1500 mg/day       Psychosocial: Target Goals: Acknowledge presence or absence of significant depression and/or stress, maximize coping skills, provide positive support system. Participant is able to verbalize types and ability to use techniques and skills needed for reducing stress and depression.  Initial Review & Psychosocial Screening: Initial Psych Review & Screening - 03/02/19 1610      Initial Review   Current issues with  Current Depression;History of Depression;Current Stress Concerns    Source of Stress Concerns  Chronic Illness;Unable to perform yard/household activities      Family Dynamics   Good Support System?  Yes      Barriers   Psychosocial barriers to participate in program  The patient should benefit from training in stress management and relaxation.      Screening Interventions   Interventions  Encouraged to exercise       Quality of Life Scores:  Scores of 19 and below usually indicate a poorer quality of life in these areas.  A difference of  2-3 points is a clinically meaningful difference.  A difference of 2-3 points in the total score of the Quality of Life Index has been associated with significant improvement in overall quality of life, self-image, physical symptoms, and general health in studies assessing change in quality of life.  PHQ-9: Recent Review Flowsheet Data    Depression screen [facility] 2/9 03/02/2019 06/09/2018 04/16/2018 08/07/2017   Decreased Interest 0 Down, Depressed, Hopeless PHQ - 2 Score Altered sleeping Tired, decreased energy Change in appetite Feeling bad or failure about yourself  Trouble concentrating 0 Moving slowly or fidgety/restless Suicidal thoughts 0 0  0 0   PHQ-9 Score - Difficult doing work/chores Not difficult at all Very difficult - Very difficult     Interpretation of Total Score  Total Score Depression Severity:  1-4 = Minimal depression, 5-9 = Mild depression, 10-14 = Moderate depression, 15-19 = Moderately severe depression, 20-27 = Severe depression   Psychosocial Evaluation and Intervention: Psychosocial Evaluation - 03/02/19 1004      Psychosocial Evaluation & Interventions   Interventions  Relaxation education;Stress management education    Continue Psychosocial Services   Follow up required by counselor   Patient is seeing a counsellor biweekly      Psychosocial Re-Evaluation: Psychosocial Re-Evaluation    Row Name 03/02/19 1009 03/23/19 1206           Psychosocial Re-Evaluation   Current issues with  Current Depression;History of Depression;Current Stress Concerns  Current Stress Concerns;History of Depression;Current Depression      Comments  -  Depression is stable at this time, she just started the program and has attended 4 exercise sessions.  At this time she has no barriers and is dealing with stressors as best she can.  She sees a therapist bi-weekly.      Expected Outcomes  -  For Jadee to cope with her stressors in a healthy manner.      Interventions  -  Stress management education;Encouraged to attend Pulmonary Rehabilitation for the exercise;Relaxation education      Continue Psychosocial Services   -  Follow up required by counselor f/u  by the therapist she sees bi-weekly.      Comments  -  These stressors are ongoing, patient is tolerating well.        Initial Review   Source of Stress Concerns  -  Chronic Illness;Unable to participate in former interests or hobbies;Unable to perform yard/household activities         Psychosocial Discharge (Final Psychosocial Re-Evaluation): Psychosocial Re-Evaluation - 03/23/19 1206      Psychosocial Re-Evaluation   Current issues with  Current  Stress Concerns;History of Depression;Current Depression    Comments  Depression is stable at this time, she just started the program and has attended 4 exercise sessions.  At this time she has no barriers and is dealing with stressors as best she can.  She sees a therapist bi-weekly.    Expected Outcomes  For Analiyah to cope with her stressors in a healthy manner.    Interventions  Stress management education;Encouraged to attend Pulmonary Rehabilitation for the exercise;Relaxation education    Continue Psychosocial Services   Follow up required by counselor   f/u by the therapist she sees bi-weekly.   Comments  These stressors are ongoing, patient is tolerating well.      Initial Review   Source of Stress Concerns  Chronic Illness;Unable to participate in former interests or hobbies;Unable to perform yard/household activities       Education: Education Goals: Education classes will be provided on a weekly basis, covering required topics. Participant will state understanding/return demonstration of topics presented.  Learning Barriers/Preferences: Learning Barriers/Preferences - 03/02/19 1009      Learning Barriers/Preferences   Learning Barriers  None    Learning Preferences  Individual Instruction;Computer/Internet;Written Material       Education Topics: Risk Factor Reduction:  -Group instruction that is supported by a PowerPoint presentation. Instructor discusses the definition of a risk factor, different risk factors for pulmonary disease, and how the heart and lungs work together.     Nutrition for Pulmonary Patient:  -Group instruction provided by PowerPoint slides, verbal discussion, and written materials to support subject matter. The instructor gives an explanation and review of healthy diet recommendations, which includes a discussion on weight management, recommendations for fruit and vegetable consumption, as well as protein, fluid, caffeine, fiber, sodium, sugar, and  alcohol. Tips for eating when patients are short of breath are discussed.   Pursed Lip Breathing:  -Group instruction that is supported by demonstration and informational handouts. Instructor discusses the benefits of pursed lip and diaphragmatic breathing and detailed demonstration on how to preform both.     Oxygen Safety:  -Group instruction provided by PowerPoint, verbal discussion, and written material to support subject matter. There is an overview of "What is Oxygen" and "Why do we need it".  Instructor also reviews how to create a safe environment for oxygen use, the importance of using oxygen as prescribed, and the risks of noncompliance. There is a brief discussion on traveling with oxygen and resources the patient may utilize.   Oxygen Equipment:  -Group instruction provided by [facility] Staff utilizing handouts, written materials, and equipment demonstrations.   Signs and Symptoms:  -Group instruction provided by written material and verbal discussion to support subject matter. Warning signs and symptoms of infection, stroke, and heart attack are reviewed and when to call the physician/911 reinforced. Tips for preventing the spread of infection discussed.   Advanced Directives:  -Group instruction provided by verbal instruction and written material to support subject matter. Instructor reviews Scientist, water quality  laws and proper instruction for filling out document.   Pulmonary Video:  -Group video education that reviews the importance of medication and oxygen compliance, exercise, good nutrition, pulmonary hygiene, and pursed lip and diaphragmatic breathing for the pulmonary patient.   Exercise for the Pulmonary Patient:  -Group instruction that is supported by a PowerPoint presentation. Instructor discusses benefits of exercise, core components of exercise, frequency, duration, and intensity of an exercise routine, importance of utilizing pulse oximetry during exercise,  safety while exercising, and options of places to exercise outside of rehab.     Pulmonary Medications:  -Verbally interactive group education provided by instructor with focus on inhaled medications and proper administration.   Anatomy and Physiology of the Respiratory System and Intimacy:  -Group instruction provided by PowerPoint, verbal discussion, and written material to support subject matter. Instructor reviews respiratory cycle and anatomical components of the respiratory system and their functions. Instructor also reviews differences in obstructive and restrictive respiratory diseases with examples of each. Intimacy, Sex, and Sexuality differences are reviewed with a discussion on how relationships can change when diagnosed with pulmonary disease. Common sexual concerns are reviewed.   MD DAY -A group question and answer session with a medical doctor that allows participants to ask questions that relate to their pulmonary disease state.   OTHER EDUCATION -Group or individual verbal, written, or video instructions that support the educational goals of the pulmonary rehab program.   Holiday Eating Survival Tips:  -Group instruction provided by PowerPoint slides, verbal discussion, and written materials to support subject matter. The instructor gives patients tips, tricks, and techniques to help them not only survive but enjoy the holidays despite the onslaught of food that accompanies the holidays.   PULMONARY REHAB OTHER RESPIRATORY [facility]  Date  03/24/19  Educator  -- [Handout]      Knowledge Questionnaire Score: Knowledge Questionnaire Score - 03/02/19 1101      Knowledge Questionnaire Score   Pre Score  17/18       Core Components/Risk Factors/Patient Goals at Admission: Personal Goals and Risk Factors at Admission - 03/02/19 1009      Core Components/Risk Factors/Patient Goals on Admission   Heart Failure  Yes     Expected Outcomes  Improve functional capacity of life;Short term: Attendance in program 2-3 days a week with increased exercise capacity. Reported lower sodium intake. Reported increased fruit and vegetable intake. Reports medication compliance.;Short term: Daily weights obtained and reported for increase. Utilizing diuretic protocols set by physician.;Long term: Adoption of self-care skills and reduction of barriers for early signs and symptoms recognition and intervention leading to self-care maintenance.    Stress  Yes    Intervention  Offer individual and/or small group education and counseling on adjustment to heart disease, stress management and health-related lifestyle change. Teach and support self-help strategies.;Refer participants experiencing significant psychosocial distress to appropriate mental health specialists for further evaluation and treatment. When possible, include family members and significant others in education/counseling sessions.       Core Components/Risk Factors/Patient Goals Review:  Goals and Risk Factor Review    Row Name 03/02/19 1013 03/23/19 1210           Core Components/Risk Factors/Patient Goals Review   Personal Goals Review  Increase knowledge of respiratory medications and ability to use respiratory devices properly.;Improve shortness of breath with ADL's;Develop more efficient breathing techniques such as purse lipped breathing and diaphragmatic breathing and practicing self-pacing with activity.;Heart Failure  Develop more efficient breathing techniques such as purse lipped breathing and diaphragmatic breathing and practicing self-pacing with activity.;Heart Failure;Increase knowledge of respiratory medications and ability to use respiratory devices properly.;Improve shortness of breath with ADL's      Review  -  Beau just started the program and has attended 4 exercise sessions, too early to see progression towards goals.      Expected Outcomes  -   See admission goals.         Core Components/Risk Factors/Patient Goals at Discharge (Final Review):  Goals and Risk Factor Review - 03/23/19 1210      Core Components/Risk Factors/Patient Goals Review   Personal Goals Review  Develop more efficient breathing techniques such as purse lipped breathing and diaphragmatic breathing and practicing self-pacing with activity.;Heart Failure;Increase knowledge of respiratory medications and ability to use respiratory devices properly.;Improve shortness of breath with ADL's    Review  Jermika just started the program and has attended 4 exercise sessions, too early to see progression towards goals.    Expected Outcomes  See admission goals.       ITP Comments: ITP Comments    Row Name 01/07/19 1442           ITP Comments  Patient was transitioned to virtual cardiac rehab program. Patient has completed use of the app.          Comments: ITP REVIEW Pt is making expected progress toward pulmonary rehab goals after completing 5 sessions. Recommend continued exercise, life style modification, education, and utilization of breathing techniques to increase stamina and strength and decrease shortness of breath with exertion.

## 2019-03-26 ENCOUNTER — Other Ambulatory Visit: Payer: Self-pay

## 2019-03-26 ENCOUNTER — Encounter (HOSPITAL_COMMUNITY)
Admission: RE | Admit: 2019-03-26 | Discharge: 2019-03-26 | Disposition: A | Discharge status: Home / Self Care | Payer: Commercial Managed Care - PPO | Source: Ambulatory Visit | Attending: Internal Medicine | Admitting: Internal Medicine

## 2019-03-26 DIAGNOSIS — R0600 Dyspnea, unspecified: Secondary | ICD-10-CM

## 2019-03-26 NOTE — Progress Notes (Signed)
Daily Session Note  Patient Details  Name: Meredith Diaz MRN: [id number] Date of Birth: 11-14-1980 Referring Provider:     Pulmonary Rehab Walk Test from 03/02/2019 in [place]  Referring Provider  Dr. Chase Caller      Encounter Date: 03/26/2019  Check In: Session Check In - 03/26/19 1258      Check-In   Supervising physician immediately available to respond to emergencies  [facility] immediately available    Physician(s)  Dr. Wyline Copas    Location  [facility]    Staff Present  Rosebud Poles, RN, Bjorn Loser, MS, Exercise Physiologist;Lisa Ysidro Evert, RN    Virtual Visit  No    Medication changes reported      No    Fall or balance concerns reported     No    Tobacco Cessation  No Change    Warm-up and Cool-down  Performed as group-led instruction    Resistance Training Performed  Yes    VAD Patient?  No    PAD/SET Patient?  No      Pain Assessment   Currently in Pain?  No/denies    Multiple Pain Sites  No       Capillary Blood Glucose: No results found for this or any previous visit (from the past 24 hour(s)).  Exercise Prescription Changes - 03/26/19 1400      Home Exercise Plan   Plans to continue exercise at  Home (comment)    Frequency  Add 2 additional days to program exercise sessions.    Initial Home Exercises Provided  03/26/19       Social History   Tobacco Use  Smoking Status Never Smoker  Smokeless Tobacco Never Used    Goals Met:  Independence with exercise equipment Exercise tolerated well Strength training completed today  Goals Unmet:  Not Applicable  Comments: Service time is from 1255 to 1305    Dr. Rush Farmer is Medical Director for Pulmonary Rehab at [facility].

## 2019-03-26 NOTE — Progress Notes (Addendum)
I have reviewed a Home Exercise Prescription with Meredith Diaz . Meredith Diaz is not currently exercising at home.  The patient was advised to walk and use the bike 2 days a week for 30-45 minutes.  Meredith Diaz and I discussed how to progress their exercise prescription.  The patient stated that their goals were to feel better and increase activity tolerance.  The patient stated that they understand the exercise prescription.  We reviewed exercise guidelines, target heart rate during exercise, RPE Scale, weather conditions, NTG use, endpoints for exercise, warmup and cool down.  Patient is encouraged to come to me with any questions. I will continue to follow up with the patient to assist them with progression and safety.    Meredith Diaz, M.S., ACSM CEP

## 2019-03-30 ENCOUNTER — Encounter (HOSPITAL_COMMUNITY): Payer: Self-pay | Admitting: Cardiology

## 2019-03-30 ENCOUNTER — Other Ambulatory Visit: Payer: Self-pay

## 2019-03-30 ENCOUNTER — Ambulatory Visit (HOSPITAL_COMMUNITY)
Admission: RE | Admit: 2019-03-30 | Discharge: 2019-03-30 | Disposition: A | Discharge status: Home / Self Care | Payer: Commercial Managed Care - PPO | Source: Ambulatory Visit | Attending: Cardiology | Admitting: Cardiology

## 2019-03-30 VITALS — BP 124/86 | HR 87 | Wt 229.4 lb

## 2019-03-30 DIAGNOSIS — I428 Other cardiomyopathies: Secondary | ICD-10-CM | POA: Insufficient documentation

## 2019-03-30 DIAGNOSIS — Z7951 Long term (current) use of inhaled steroids: Secondary | ICD-10-CM | POA: Diagnosis not present

## 2019-03-30 DIAGNOSIS — F329 Major depressive disorder, single episode, unspecified: Secondary | ICD-10-CM | POA: Insufficient documentation

## 2019-03-30 DIAGNOSIS — M419 Scoliosis, unspecified: Secondary | ICD-10-CM | POA: Insufficient documentation

## 2019-03-30 DIAGNOSIS — Z6841 Body Mass Index (BMI) 40.0 and over, adult: Secondary | ICD-10-CM | POA: Insufficient documentation

## 2019-03-30 DIAGNOSIS — Z79890 Hormone replacement therapy (postmenopausal): Secondary | ICD-10-CM | POA: Insufficient documentation

## 2019-03-30 DIAGNOSIS — I5042 Chronic combined systolic (congestive) and diastolic (congestive) heart failure: Secondary | ICD-10-CM | POA: Diagnosis not present

## 2019-03-30 DIAGNOSIS — G4733 Obstructive sleep apnea (adult) (pediatric): Secondary | ICD-10-CM | POA: Diagnosis not present

## 2019-03-30 DIAGNOSIS — Z79899 Other long term (current) drug therapy: Secondary | ICD-10-CM | POA: Insufficient documentation

## 2019-03-30 DIAGNOSIS — I5022 Chronic systolic (congestive) heart failure: Secondary | ICD-10-CM | POA: Diagnosis not present

## 2019-03-30 DIAGNOSIS — E669 Obesity, unspecified: Secondary | ICD-10-CM | POA: Insufficient documentation

## 2019-03-30 DIAGNOSIS — E039 Hypothyroidism, unspecified: Secondary | ICD-10-CM | POA: Diagnosis not present

## 2019-03-30 DIAGNOSIS — J45909 Unspecified asthma, uncomplicated: Secondary | ICD-10-CM | POA: Diagnosis not present

## 2019-03-30 LAB — BASIC METABOLIC PANEL
Anion gap: 10 (ref 5–15)
BUN: 16 mg/dL (ref 6–20)
CO2: 35 mmol/L — ABNORMAL HIGH (ref 22–32)
Calcium: 8.8 mg/dL — ABNORMAL LOW (ref 8.9–10.3)
Chloride: 91 mmol/L — ABNORMAL LOW (ref 98–111)
Creatinine, Ser: 0.96 mg/dL (ref 0.44–1.00)
GFR calc Af Amer: >60 mL/min (ref >60)
GFR calc non Af Amer: >60 mL/min (ref >60)
Glucose, Bld: 112 mg/dL — ABNORMAL HIGH (ref 70–99)
Potassium: 3.4 mmol/L — ABNORMAL LOW (ref 3.5–5.1)
Sodium: 136 mmol/L (ref 135–145)

## 2019-03-30 NOTE — Progress Notes (Signed)
Date:  03/30/2019   ID:  Meredith Diaz, DOB 04/13/1981, MRN [id number]  Provider location: [place] [facility] Type of Visit: Established patient   PCP:  Myrlene Broker, MD  Cardiologist:  Dr. Shirlee Latch  Chief Complaint: Shortness of breath   History of Present Illness: Meredith Diaz is a 38 y.o. female who has a history of nonischemic cardiomyopathy.  This was diagnosed in 2013.  EF was initially 25-30%. Cardiac cath at that time showed no significant CAD.  Cardiac MRI showed no late gadoliniuim enhancement.  Since that time, EF has remained low but has improved a bit.  Most recent echo in 11/17 showed EF 45-50% with diffuse hypokinesis.  She also has asthma.  PFTs showed a severe restrictive defect that may be due to body habitus primarily.   She was admitted in 8/16 with chest pain.  CTA chest showed no PE, lung parenchyma looked normal.  She had ETT with poor exercise tolerance but no evidence for ischemia.  CPX was done in 12/16, showing moderate functional limitation due primarily to lung restriction.   Admitted 11/4 - 03/13/18 for increased DOE. Diuresed well with IV lasix and treated for asthma exacerbation. Treated with solumedrol and prednisone burst. Echo with EF 50-55%, mildly dilated/dysfunctional RV, septal bounce noted but IVC normal in size.   Due to increased dyspnea, RHC was done in 3/20.  Right and left heart filling pressures were elevated with near equalization of pressures suggesting restrictive cardiomyopathy.  No evidence of pericardial constriction.  I increased her torsemide to 40 mg bid.   She has been seeing pulmonary recently.  No evidence for interstitial lung disease.  Autoimmune workup was negative.  She is doing pulmonary rehab.   She returns for followup of CHF.  She is using oxygen during the day and CPAP at night.  She remains short of breath after walking 50-100 feet or walks up stairs, no change.  She has not been  able to lose weight.  No chest pain.  No orthopnea/PND.    Labs (9/16): K 3.2, creatinine 1.11, hgb 14.3 Labs (12/16): K 3.3, creatinine 0.96, BNP 23 Labs (2/17): K 4.7, creatinine 0.95, TSH normal Labs (8/17): K 3.9, creatinine 0.93 Labs (12/18): K 3.6, creatinine 0.82, pro-BNP 42, TSH normal Labs (12/19): K 4.4, creatinine 0.9 Labs (2/20): BNP 47, hgb 13.1, K 4.4, creatinine 0.8 Labs (5/20): K 4.2, creatinine 1.01 Labs (9/20): K 3.8, creatinine 0.85, Rf negative, CCP negative, ANA negative, dsDNA negative, SCL-70 negative  PMH: 1. Chronic systolic CHF: Nonischemic cardiomyopathy.  - EF 25-30% by echo in 2013.  - Cardiac MRI (2013) with EF 52%, no delayed enhancement.  - LHC in 2013 without significant CAD. - 11/15 echo with EF 40-45%.   - 7/16 echo with EF 40-45%, grade II diastolic dysfunction.  - ETT (7/82) with poor exercise tolerance but no ischemia.  - CPX (12/16): peak VO2 15.7 (23.5 adjusted for ideal body weight), VE/VCO2 slope 21, RER 1.15 => moderate functional limitation thought to be primarily due to restrictive lung physiology.  - Echo (11/17): EF 45-50%, mild diffuse hypokinesis, normal RV size and systolic function.  - Echo (11/19): EF 50-55%, mildly dilated and mildly dysfunctional RV with respirophasic septal bounce noted, normal IVC and PA pressure estimate.  - RHC (3/20): mean 19, PA 54/18 mean 38, mean PCWP 23, CI 2.77, PVR 2.79 WU. Equalization of pressures suggestive of restrictive cardiomyopathy, not suggestive of pericardial constriction.  2. Asthma:  PFTs (4/16) with mild obstruction, severe restriction and severely decreased DLCO => think primarily due to body habitus.  3. Depression 4. Hypothyroidism 5. 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  6. Angioedema with ACEI 7. OHS/OSA: CPAP at night, oxygen during the day.  8. Chronic leukocytosis: Negative workup.  9. BPPV 10. Obesity  11. Scoliosis: CT chest (5/20) showed no ILD, severe  scoliosis.    Current Outpatient Medications  Medication Sig Dispense Refill  . albuterol (VENTOLIN HFA) 108 (90 Base) MCG/ACT inhaler INHALE 1-2 PUFFS INTO THE LUNGS EVERY 6 (SIX) HOURS AS NEEDED FOR WHEEZING OR SHORTNESS OF BREATH. 18 Inhaler 3  . budesonide-formoterol (SYMBICORT) 160-4.5 MCG/ACT inhaler TAKE 2 PUFFS BY MOUTH TWICE A DAY 10.2 Inhaler 5  . clobetasol cream (TEMOVATE) 4.58 % Apply 1 application topically See admin instructions. Apply daily to affected areas of genital area as directed    . ipratropium-albuterol (DUONEB) 0.5-2.5 (3) MG/3ML SOLN Take 3 mLs by nebulization 2 (two) times daily as needed (For wheezing or shoortness of breath). (Patient taking differently: Take 3 mLs by nebulization 2 (two) times daily as needed (For wheezing or shortness of breath). ) 180 mL 5  . levothyroxine (SYNTHROID, LEVOTHROID) 125 MCG tablet Take 1 tablet (125 mcg total) by mouth daily. 90 tablet 2  . metoprolol succinate (TOPROL-XL) 25 MG 24 hr tablet Take 1 tablet (25 mg total) by mouth at bedtime. 30 tablet 6  . potassium chloride 20 MEQ TBCR Take 20 mEq by mouth 2 (two) times daily. 180 tablet 0  . spironolactone (ALDACTONE) 25 MG tablet TAKE 1 TABLET BY MOUTH EVERY DAY 30 tablet 11  . torsemide (DEMADEX) 20 MG tablet Take 4 tablets (80 mg total) by mouth every morning AND 3 tablets (60 mg total) at bedtime. 630 tablet 3  . valsartan (DIOVAN) 80 MG tablet Take 1 tablet (80 mg total) by mouth daily. 60 tablet 6  . vortioxetine HBr (TRINTELLIX) 10 MG TABS tablet Take 1 tablet (10 mg total) by mouth daily. 90 tablet 1   No current facility-administered medications for this encounter.     Allergies:   Lisinopril   Social History:  The patient  reports that she has never smoked. She has never used smokeless tobacco. She reports that she does not drink alcohol or use drugs.   Family History:  The patient's family history includes Alcoholism in her mother; Asthma in her paternal grandmother;  Breast cancer (age of onset: 16) in her paternal grandmother; Breast cancer (age of onset: 67) in her maternal aunt and paternal aunt; Colon cancer (age of onset: 95) in her maternal grandmother; Depression in her mother; Heart attack in her maternal grandfather, maternal grandmother, paternal grandfather, and paternal grandmother; Heart disease in her father, maternal grandfather, and paternal grandmother; Hypertension in her father.   ROS:  Please see the history of present illness.   All other systems are personally reviewed and negative.   Exam:   BP 124/86   Pulse 87   Wt 104.1 kg (229 lb 6.4 oz)   SpO2 100% Comment: 2L of oxygen  BMI 44.80 kg/m  General: NAD, obese.  Neck: No JVD, no thyromegaly or thyroid nodule.  Lungs: Clear to auscultation bilaterally with normal respiratory effort. CV: Nondisplaced PMI.  Heart regular S1/S2, no S3/S4, no murmur.  No peripheral edema.  No carotid bruit.  Normal pedal pulses.  Abdomen: Soft, nontender, no hepatosplenomegaly, no distention.  Skin: Intact without lesions or rashes.  Neurologic: Alert and  oriented x 3.  Psych: Normal affect. Extremities: No clubbing or cyanosis.  HEENT: Normal.    Recent Labs: 04/16/2018: Pro B Natriuretic peptide (BNP) 24.0; TSH 2.44 07/03/2018: B Natriuretic Peptide 46.9; Platelets 340 07/10/2018: Hemoglobin 12.9 03/30/2019: BUN 16; Creatinine, Ser 0.96; Potassium 3.4; Sodium 136  Personally reviewed   Wt Readings from Last 3 Encounters:  03/30/19 104.1 kg (229 lb 6.4 oz)  03/24/19 102.4 kg (225 lb 12 oz)  03/10/19 103.1 kg (227 lb 4.7 oz)     ASSESSMENT AND PLAN:  1. Chronic systolic CHF: Nonischemic cardiomyopathy.  No CAD on prior cath, prior cMRI showed no late gadolinium enhancement.  No family history of cardiomyopathy.  No ETOH/substance abuse.  No uncontrolled HTN.  Possible that she had viral myocarditis. Last echo in  03/11/18 showed LVEF up to 50-55% but with mildly dilated/dysfunctional RV, IVC  appeared normal and PA pressure estimation was normal, septal bounce was noted.  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  No ventricular interdependence to suggest pericardial constriction. Still NYHA class III symptoms. Difficult exam for volume but I do not think that she is markedly volume overloaded. I think that a lot of her symptomatology is due to restriction from excess weight => OHS/OSA.  - Continue torsemide 80 qam/60 qpm. BMET today.  - Continue Toprol XL 25 mg daily.  - Continue spironolactone 25 daily.   - Continue valsartan 80 mg daily.   - Continue pulmonary rehab.  2. Asthma: No wheezing on exam.  3. OHS/OSA: She uses CPAP at night and oxygen during the day.  OHS is likely due to lung restriction from obesity and scoliosis, CT chest in 5/20 showed no ILD but did show severe scoliosis.  I think OHS/OSA plays a large role in her dyspnea.  - Continue pulmonary rehab.  4. Obesity: Weight loss is imperative.  Still waiting to get an appointment with [facility] for bariatric surgery evaluation.   Followup in 4 months.   Signed, Marca Ancona, MD  03/30/2019  [facility] [address] [facility] [place] [address] [phone] (office) [phone] (fax)

## 2019-03-30 NOTE — Patient Instructions (Addendum)
No medication changes today!  Labs today We will only contact you if something comes back abnormal or we need to make some changes. Otherwise no news is good news!  Your physician recommends that you schedule a follow-up appointment in: 4 months with Dr Aundra Dubin.  Please call office at [phone] option 2 if you have any questions or concerns.   At the [facility], you and your health needs are our priority. As part of our continuing mission to provide you with exceptional heart care, we have created designated Provider Care Teams. These Care Teams include your primary Cardiologist (physician) and Advanced Practice Providers (APPs- Physician Assistants and Nurse Practitioners) who all work together to provide you with the care you need, when you need it.   You may see any of the following providers on your designated Care Team at your next follow up: [name] Dr Glori Bickers . Dr Loralie Champagne . Darrick Grinder, NP . Lyda Jester, PA   Please be sure to bring in all your medications bottles to every appointment.

## 2019-03-31 ENCOUNTER — Encounter (HOSPITAL_COMMUNITY)
Admission: RE | Admit: 2019-03-31 | Discharge: 2019-03-31 | Disposition: A | Discharge status: Home / Self Care | Payer: Commercial Managed Care - PPO | Source: Ambulatory Visit | Attending: Internal Medicine | Admitting: Internal Medicine

## 2019-03-31 DIAGNOSIS — R0600 Dyspnea, unspecified: Secondary | ICD-10-CM | POA: Diagnosis not present

## 2019-03-31 NOTE — Progress Notes (Signed)
Daily Session Note  Patient Details  Name: Meredith Diaz MRN: [id number] Date of Birth: 25-May-1980 Referring Provider:     Pulmonary Rehab Walk Test from 03/02/2019 in [place]  Referring Provider  Dr. Chase Caller      Encounter Date: 03/31/2019  Check In: Session Check In - 03/31/19 1308      Check-In   Supervising physician immediately available to respond to emergencies  [facility] immediately available    Physician(s)  Dr. Wyline Copas    Location  [facility]    Staff Present  Rosebud Poles, RN, Bjorn Loser, MS, Exercise Physiologist;Lisa Ysidro Evert, RN    Virtual Visit  No    Medication changes reported      No    Fall or balance concerns reported     No    Tobacco Cessation  No Change    Warm-up and Cool-down  Performed as group-led instruction    Resistance Training Performed  Yes    VAD Patient?  No    PAD/SET Patient?  No      Pain Assessment   Currently in Pain?  No/denies    Multiple Pain Sites  No       Capillary Blood Glucose: No results found for this or any previous visit (from the past 24 hour(s)).    Social History   Tobacco Use  Smoking Status Never Smoker  Smokeless Tobacco Never Used    Goals Met:  Independence with exercise equipment Exercise tolerated well Strength training completed today  Goals Unmet:  Not Applicable  Comments: Service time is from 1245 to 1340    Dr. Rush Farmer is Medical Director for Pulmonary Rehab at [facility].

## 2019-04-07 ENCOUNTER — Encounter (HOSPITAL_COMMUNITY)
Admission: RE | Admit: 2019-04-07 | Discharge: 2019-04-07 | Disposition: A | Discharge status: Home / Self Care | Payer: Commercial Managed Care - PPO | Source: Ambulatory Visit | Attending: Internal Medicine | Admitting: Internal Medicine

## 2019-04-07 ENCOUNTER — Other Ambulatory Visit: Payer: Self-pay

## 2019-04-07 VITALS — Wt 229.3 lb

## 2019-04-07 DIAGNOSIS — I5042 Chronic combined systolic (congestive) and diastolic (congestive) heart failure: Secondary | ICD-10-CM

## 2019-04-07 DIAGNOSIS — R0600 Dyspnea, unspecified: Secondary | ICD-10-CM | POA: Diagnosis present

## 2019-04-07 NOTE — Progress Notes (Signed)
Daily Session Note  Patient Details  Name: Meredith Diaz MRN: [id number] Date of Birth: Dec 30, 1980 Referring Provider:     Pulmonary Rehab Walk Test from 03/02/2019 in [place]  Referring Provider  Dr. Chase Caller      Encounter Date: 04/07/2019  Check In: Session Check In - 04/07/19 1335      Check-In   Supervising physician immediately available to respond to emergencies  [facility] immediately available    Physician(s)  Dr. Eliseo Squires    Location  [facility]    Staff Present  Rosebud Poles, RN, Bjorn Loser, MS, Exercise Physiologist;Meganne Rita Ysidro Evert, RN    Virtual Visit  No    Medication changes reported      No    Fall or balance concerns reported     No    Tobacco Cessation  No Change    Warm-up and Cool-down  Performed on first and last piece of equipment    Resistance Training Performed  Yes    VAD Patient?  No    PAD/SET Patient?  No      Pain Assessment   Currently in Pain?  No/denies    Multiple Pain Sites  No       Capillary Blood Glucose: No results found for this or any previous visit (from the past 24 hour(s)).  Exercise Prescription Changes - 04/07/19 1500      Response to Exercise   Blood Pressure (Admit)  110/60    Blood Pressure (Exercise)  122/64    Blood Pressure (Exit)  96/70    Heart Rate (Admit)  91 bpm    Heart Rate (Exercise)  105 bpm    Heart Rate (Exit)  95 bpm    Oxygen Saturation (Admit)  94 %    Oxygen Saturation (Exercise)  95 %    Oxygen Saturation (Exit)  97 %    Rating of Perceived Exertion (Exercise)  15    Perceived Dyspnea (Exercise)  3    Duration  Continue with 30 min of aerobic exercise without signs/symptoms of physical distress.    Intensity  THRR unchanged      Progression   Progression  Continue to progress workloads to maintain intensity without signs/symptoms of physical distress.      Resistance Training   Training Prescription  Yes    Weight  orange  bands    Reps  10-15    Time  10 Minutes      Oxygen   Oxygen  Continuous    Liters  2      Recumbant Bike   Level  1    Minutes  15      NuStep   Level  3    SPM  80    Minutes  15    METs  1.9       Social History   Tobacco Use  Smoking Status Never Smoker  Smokeless Tobacco Never Used    Goals Met:  Exercise tolerated well No report of cardiac concerns or symptoms Strength training completed today  Goals Unmet:  Not Applicable  Comments: Service time is from 1250 to 1403    Dr. Rush Farmer is Medical Director for Pulmonary Rehab at [facility].

## 2019-04-09 ENCOUNTER — Other Ambulatory Visit: Payer: Self-pay

## 2019-04-09 ENCOUNTER — Encounter (HOSPITAL_COMMUNITY)
Admission: RE | Admit: 2019-04-09 | Discharge: 2019-04-09 | Disposition: A | Discharge status: Home / Self Care | Payer: Commercial Managed Care - PPO | Source: Ambulatory Visit | Attending: Internal Medicine | Admitting: Internal Medicine

## 2019-04-09 DIAGNOSIS — R0600 Dyspnea, unspecified: Secondary | ICD-10-CM

## 2019-04-09 NOTE — Progress Notes (Signed)
Daily Session Note  Patient Details  Name: Meredith Diaz MRN: [id number] Date of Birth: Apr 25, 1981 Referring Provider:     Pulmonary Rehab Walk Test from 03/02/2019 in [place]  Referring Provider  Dr. Chase Caller      Encounter Date: 04/09/2019  Check In: Session Check In - 04/09/19 1335      Check-In   Supervising physician immediately available to respond to emergencies  [facility] immediately available    Physician(s)  Dr. Benny Lennert    Location  [facility]    Staff Present  Rosebud Poles, RN, Bjorn Loser, MS, Exercise Physiologist;Lisa Ysidro Evert, RN    Virtual Visit  No    Medication changes reported      No    Fall or balance concerns reported     No    Tobacco Cessation  No Change    Warm-up and Cool-down  Performed as group-led instruction    Resistance Training Performed  Yes    VAD Patient?  No    PAD/SET Patient?  No      Pain Assessment   Currently in Pain?  No/denies       Capillary Blood Glucose: No results found for this or any previous visit (from the past 24 hour(s)).    Social History   Tobacco Use  Smoking Status Never Smoker  Smokeless Tobacco Never Used    Goals Met:  Proper associated with RPD/PD & O2 Sat Exercise tolerated well Strength training completed today  Goals Unmet:  Not Applicable  Comments: Service time is from 1345 to 1405.  Patient was upset and was tearful when she was weighed and had gained 1 kg since 2 days ago.  She states her diet has been low sodium and will weigh herself tomorrow and if she has gained another pound she will take an extra diuretic as directed by her physician.    Dr. Rush Farmer is Medical Director for Pulmonary Rehab at [facility].

## 2019-04-14 ENCOUNTER — Encounter (HOSPITAL_COMMUNITY): Payer: Commercial Managed Care - PPO

## 2019-04-16 ENCOUNTER — Encounter (HOSPITAL_COMMUNITY)
Admission: RE | Admit: 2019-04-16 | Discharge: 2019-04-16 | Disposition: A | Discharge status: Home / Self Care | Payer: Commercial Managed Care - PPO | Source: Ambulatory Visit | Attending: Internal Medicine | Admitting: Internal Medicine

## 2019-04-16 ENCOUNTER — Other Ambulatory Visit: Payer: Self-pay

## 2019-04-16 DIAGNOSIS — R0600 Dyspnea, unspecified: Secondary | ICD-10-CM | POA: Diagnosis not present

## 2019-04-16 NOTE — Progress Notes (Signed)
Daily Session Note  Patient Details  Name: Meredith Diaz MRN: [id number] Date of Birth: 1981/03/12 Referring Provider:     Pulmonary Rehab Walk Test from 03/02/2019 in [place]  Referring Provider  Dr. Chase Caller      Encounter Date: 04/16/2019  Check In: Session Check In - 04/16/19 1250      Check-In   Supervising physician immediately available to respond to emergencies  [facility] immediately available    Physician(s)  Dr. Benny Lennert    Location  [facility]    Staff Present  Rosebud Poles, RN, Bjorn Loser, MS, Exercise Physiologist;Lisa Ysidro Evert, RN    Virtual Visit  No    Medication changes reported      No    Fall or balance concerns reported     No    Tobacco Cessation  No Change    Warm-up and Cool-down  Performed as group-led instruction    Resistance Training Performed  Yes    VAD Patient?  No    PAD/SET Patient?  No      Pain Assessment   Currently in Pain?  No/denies    Multiple Pain Sites  No       Capillary Blood Glucose: No results found for this or any previous visit (from the past 24 hour(s)).    Social History   Tobacco Use  Smoking Status Never Smoker  Smokeless Tobacco Never Used    Goals Met:  Proper associated with RPD/PD & O2 Sat Exercise tolerated well Strength training completed today  Goals Unmet:  Not Applicable  Comments: Service time is from 1250 to 1348.    Dr. Rush Farmer is Medical Director for Pulmonary Rehab at [facility].

## 2019-04-21 ENCOUNTER — Encounter (HOSPITAL_COMMUNITY)
Admission: RE | Admit: 2019-04-21 | Discharge: 2019-04-21 | Disposition: A | Discharge status: Home / Self Care | Payer: Commercial Managed Care - PPO | Source: Ambulatory Visit | Attending: Internal Medicine | Admitting: Internal Medicine

## 2019-04-21 ENCOUNTER — Other Ambulatory Visit: Payer: Self-pay | Admitting: Pulmonary Disease

## 2019-04-21 ENCOUNTER — Other Ambulatory Visit: Payer: Self-pay

## 2019-04-21 VITALS — Wt 227.5 lb

## 2019-04-21 DIAGNOSIS — R0600 Dyspnea, unspecified: Secondary | ICD-10-CM | POA: Diagnosis not present

## 2019-04-21 NOTE — Progress Notes (Signed)
Daily Session Note  Patient Details  Name: Meredith Diaz MRN: [id number] Date of Birth: 15-Nov-1980 Referring Provider:     Pulmonary Rehab Walk Test from 03/02/2019 in [place]  Referring Provider  Dr. Chase Caller      Encounter Date: 04/21/2019  Check In: Session Check In - 04/21/19 1300      Check-In   Supervising physician immediately available to respond to emergencies  [facility] immediately available    Physician(s)  [facility]    Location  [facility]    Staff Present  Rosebud Poles, RN, Bjorn Loser, MS, Exercise Physiologist;Lisa Ysidro Evert, RN    Virtual Visit  No    Medication changes reported      No    Fall or balance concerns reported     No    Tobacco Cessation  No Change    Warm-up and Cool-down  Performed as group-led instruction    Resistance Training Performed  Yes    VAD Patient?  No    PAD/SET Patient?  No      Pain Assessment   Currently in Pain?  No/denies    Multiple Pain Sites  No       Capillary Blood Glucose: No results found for this or any previous visit (from the past 24 hour(s)).  Exercise Prescription Changes - 04/21/19 1400      Response to Exercise   Blood Pressure (Admit)  100/60    Blood Pressure (Exercise)  124/76    Blood Pressure (Exit)  112/74    Heart Rate (Admit)  95 bpm    Heart Rate (Exercise)  110 bpm    Heart Rate (Exit)  97 bpm    Oxygen Saturation (Admit)  98 %    Oxygen Saturation (Exercise)  95 %    Oxygen Saturation (Exit)  98 %    Rating of Perceived Exertion (Exercise)  15    Perceived Dyspnea (Exercise)  3    Duration  Continue with 30 min of aerobic exercise without signs/symptoms of physical distress.    Intensity  THRR unchanged      Progression   Progression  Continue to progress workloads to maintain intensity without signs/symptoms of physical distress.      Resistance Training   Training Prescription  Yes    Weight  orange bands    Reps  10-15    Time  10 Minutes      Oxygen   Oxygen  Continuous    Liters  2      Recumbant Bike   Level  1.2    Minutes  15    METs  1.7      NuStep   Level  3    SPM  80    Minutes  15    METs  1.7       Social History   Tobacco Use  Smoking Status Never Smoker  Smokeless Tobacco Never Used    Goals Met:  Independence with exercise equipment Exercise tolerated well Strength training completed today  Goals Unmet:  Not Applicable  Comments: Service time is from 1250 to 1350    Dr. Rush Farmer is Medical Director for Pulmonary Rehab at [facility].

## 2019-04-21 NOTE — Progress Notes (Signed)
Pulmonary Individual Treatment Plan  Patient Details  Name: Meredith Diaz MRN: [id number] Date of Birth: December 27, 1980 Referring Provider:     Pulmonary Rehab Walk Test from 03/02/2019 in [facility]  Referring Provider  Dr. Marchelle Gearing      Initial Encounter Date:    Pulmonary Rehab Walk Test from 03/02/2019 in [facility] CARD[facility]HAB  Date  03/02/19      Visit Diagnosis: Dyspnea, unspecified type  Patient's Home Medications on Admission:   Current Outpatient Medications:  .  albuterol (VENTOLIN HFA) 108 (90 Base) MCG/ACT inhaler, INHALE 1-2 PUFFS INTO THE LUNGS EVERY 6 (SIX) HOURS AS NEEDED FOR WHEEZING OR SHORTNESS OF BREATH., Disp: 18 Inhaler, Rfl: 3 .  budesonide-formoterol (SYMBICORT) 160-4.5 MCG/ACT inhaler, TAKE 2 PUFFS BY MOUTH TWICE A DAY, Disp: 10.2 Inhaler, Rfl: 5 .  clobetasol cream (TEMOVATE) 0.05 %, Apply 1 application topically See admin instructions. Apply daily to affected areas of genital area as directed, Disp: , Rfl:  .  ipratropium-albuterol (DUONEB) 0.5-2.5 (3) MG/3ML SOLN, Take 3 mLs by nebulization 2 (two) times daily as needed (For wheezing or shoortness of breath). (Patient taking differently: Take 3 mLs by nebulization 2 (two) times daily as needed (For wheezing or shortness of breath). ), Disp: 180 mL, Rfl: 5 .  levothyroxine (SYNTHROID, LEVOTHROID) 125 MCG tablet, Take 1 tablet (125 mcg total) by mouth daily., Disp: 90 tablet, Rfl: 2 .  metoprolol succinate (TOPROL-XL) 25 MG 24 hr tablet, Take 1 tablet (25 mg total) by mouth at bedtime., Disp: 30 tablet, Rfl: 6 .  potassium chloride 20 MEQ TBCR, Take 20 mEq by mouth 2 (two) times daily., Disp: 180 tablet, Rfl: 0 .  spironolactone (ALDACTONE) 25 MG tablet, TAKE 1 TABLET BY MOUTH EVERY DAY, Disp: 30 tablet, Rfl: 11 .  torsemide (DEMADEX) 20 MG tablet, Take 4 tablets (80 mg total) by mouth every morning AND 3 tablets (60 mg total) at bedtime., Disp: 630  tablet, Rfl: 3 .  valsartan (DIOVAN) 80 MG tablet, Take 1 tablet (80 mg total) by mouth daily., Disp: 60 tablet, Rfl: 6 .  vortioxetine HBr (TRINTELLIX) 10 MG TABS tablet, Take 1 tablet (10 mg total) by mouth daily., Disp: 90 tablet, Rfl: 1  Past Medical History: Past Medical History:  Diagnosis Date  . Anxiety   . Asthma   . Back pain   . Chronic combined systolic and diastolic CHF (congestive heart failure) (HCC) 07/2011   a. Prior Echo: EF was 25-30%; b. No Sig CAD on CATH; c. No significant finding on Cardiac MRI; d. follow-up Echo 2014 --  EF of 50%;  e. 03/2014 Echo: EF 40-45%, gr 1 DD; f. 11/2014 Echo: EF 40-45%, Gr 2 DD.  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  . Depression   . Dyspnea   . Essential hypertension   . Family history of breast cancer   . Family history of colon cancer   . Fibromyalgia   . Gallbladder problem   . Hypothyroidism   . IBS (irritable bowel syndrome)   . Morbid obesity (HCC)   . Nonischemic cardiomyopathy (HCC)    a. unclear etiology;  b. 01/2012 Cardiac MRI: no infiltrative pathology, EF 52%;  b. 11/2014 Echo: EF 40-45%, gr 2 DD.  [name] Scoliosis   . Sleep apnea   .  Stomach problems   . Thyroid disease   . Vitamin B 12 deficiency   . Vitamin D deficiency     Tobacco Use: Social History   Tobacco Use  Smoking Status Never Smoker  Smokeless Tobacco Never Used    Labs: Recent Review Flowsheet Data    Labs for ITP Cardiac and Pulmonary Rehab Latest Ref Rng & Units 08/07/2017 04/16/2018 07/10/2018 07/10/2018 02/19/2019   Cholestrol 100 - 199 mg/dL 130 - - - -   LDLCALC 0 - 99 mg/dL 865(H) - - - -   HDL >84 mg/dL 49 - - - -   Trlycerides 0 - 149 mg/dL 696 - - - -   Hemoglobin A1c 4.6 - 6.5 % 5.9(H) 6.4 - - -   PHART 7.350 - 7.450 - - - - 7.409   PCO2ART 32.0 - 48.0 mmHg - - - - 57.4(H)   HCO3  20.0 - 28.0 mmol/L - - 39.2(H) 37.3(H) 35.5(H)   TCO2 22 - 32 mmol/L - - 42(H) 40(H) -   O2SAT % - - 67.0 67.0 97.8      Capillary Blood Glucose: Lab Results  Component Value Date   GLUCAP 155 (H) 01/27/2015     Pulmonary Assessment Scores: Pulmonary Assessment Scores    Row Name 03/02/19 1055 03/02/19 1101       ADL UCSD   ADL Phase  Entry  Entry    SOB Score total  --  73      CAT Score   CAT Score  --  31      mMRC Score   mMRC Score  4  --      UCSD: Self-administered rating of dyspnea associated with activities of daily living (ADLs) 6-point scale (0 = "not at all" to 5 = "maximal or unable to do because of breathlessness")  Scoring Scores range from 0 to 120.  Minimally important difference is 5 units  CAT: CAT can identify the health impairment of COPD patients and is better correlated with disease progression.  CAT has a scoring range of zero to 40. The CAT score is classified into four groups of low (less than 10), medium (10 - 20), high (21-30) and very high (31-40) based on the impact level of disease on health status. A CAT score over 10 suggests significant symptoms.  A worsening CAT score could be explained by an exacerbation, poor medication adherence, poor inhaler technique, or progression of COPD or comorbid conditions.  CAT MCID is 2 points  mMRC: mMRC (Modified Medical Research Council) Dyspnea Scale is used to assess the degree of baseline functional disability in patients of respiratory disease due to dyspnea. No minimal important difference is established. A decrease in score of 1 point or greater is considered a positive change.   Pulmonary Function Assessment: Pulmonary Function Assessment - 03/02/19 0957      Breath   Bilateral Breath Sounds  Clear    Shortness of Breath  Panic with Shortness of Breath;Limiting activity;Fear of Shortness of Breath;Yes       Exercise Target Goals: Exercise Program Goal: Individual exercise prescription set  using results from initial 6 min walk test and THRR while considering  patient's activity barriers and safety.   Exercise Prescription Goal: Initial exercise prescription builds to 30-45 minutes a day of aerobic activity, 2-3 days per week.  Home exercise guidelines will be given to patient during program as part of exercise prescription that the participant will acknowledge.  Activity Barriers & Risk  Stratification:   6 Minute Walk: 6 Minute Walk    Row Name 03/02/19 1056         6 Minute Walk   Phase  Initial     Distance  861 feet     Walk Time  5.17 minutes     # of Rest Breaks  2 2 standing rest breaks. One was 35 sec and the other 15 sec     MPH  1.63     METS  2.95     RPE  17     Perceived Dyspnea   3     VO2 Peak  10.32     Symptoms  Yes (comment)     Comments  desaturated on 2L to 83%, saturations maintained at 90% and above on 3L     Resting HR  76 bpm     Resting BP  104/68     Resting Oxygen Saturation   98 %     Exercise Oxygen Saturation  during 6 min walk  83 %     Max Ex. HR  115 bpm     Max Ex. BP  122/80     2 Minute Post BP  110/82       Interval HR   1 Minute HR  95     2 Minute HR  106     3 Minute HR  110     4 Minute HR  115     5 Minute HR  114     6 Minute HR  107     2 Minute Post HR  107     Interval Heart Rate?  Yes       Interval Oxygen   Interval Oxygen?  Yes     Baseline Oxygen Saturation %  98 %     1 Minute Oxygen Saturation %  83 %     1 Minute Liters of Oxygen  2 L     2 Minute Oxygen Saturation %  94 %     2 Minute Liters of Oxygen  3 L     3 Minute Oxygen Saturation %  90 %     3 Minute Liters of Oxygen  3 L     4 Minute Oxygen Saturation %  97 %     4 Minute Liters of Oxygen  3 L     5 Minute Oxygen Saturation %  95 %     5 Minute Liters of Oxygen  3 L     6 Minute Oxygen Saturation %  99 %     6 Minute Liters of Oxygen  3 L     2 Minute Post Oxygen Saturation %  95 %     2 Minute Post Liters of Oxygen  2 L         Oxygen Initial Assessment: Oxygen Initial Assessment - 03/02/19 1054      Home Oxygen   Home Oxygen Device  Home Concentrator;Portable Concentrator;E-Tanks    Sleep Oxygen Prescription  Continuous    Liters per minute  2    Home Exercise Oxygen Prescription  Pulsed    Home at Rest Exercise Oxygen Prescription  Continuous    Liters per minute  2    Compliance with Home Oxygen Use  Yes      Initial 6 min Walk   Oxygen Used  E-Tanks    Liters per minute  3   desat on 2L  Program Oxygen Prescription   Program Oxygen Prescription  Continuous    Liters per minute  3      Intervention   Short Term Goals  To learn and exhibit compliance with exercise, home and travel O2 prescription;To learn and understand importance of monitoring SPO2 with pulse oximeter and demonstrate accurate use of the pulse oximeter.;To learn and understand importance of maintaining oxygen saturations>88%;To learn and demonstrate proper pursed lip breathing techniques or other breathing techniques.;To learn and demonstrate proper use of respiratory medications    Long  Term Goals  Compliance with respiratory medication;Exhibits proper breathing techniques, such as pursed lip breathing or other method taught during program session;Maintenance of O2 saturations>88%;Verbalizes importance of monitoring SPO2 with pulse oximeter and return demonstration;Exhibits compliance with exercise, home and travel O2 prescription       Oxygen Re-Evaluation: Oxygen Re-Evaluation    Row Name 03/24/19 0713 04/20/19 0955           Program Oxygen Prescription   Program Oxygen Prescription  Continuous  Continuous      Liters per minute  2  2      Comments  Pt only does seated exercise, and has been able to maintain sats with 2L.  --        Home Oxygen   Home Oxygen Device  Home Concentrator;Portable Concentrator;E-Tanks  Home Concentrator;Portable Concentrator;E-Tanks      Sleep Oxygen Prescription  Continuous  Continuous       Liters per minute  2  2      Home Exercise Oxygen Prescription  Pulsed  Continuous      Liters per minute  3  2      Home at Rest Exercise Oxygen Prescription  Pulsed  Pulsed      Liters per minute  2  2      Compliance with Home Oxygen Use  Yes  No        Goals/Expected Outcomes   Short Term Goals  To learn and exhibit compliance with exercise, home and travel O2 prescription;To learn and understand importance of monitoring SPO2 with pulse oximeter and demonstrate accurate use of the pulse oximeter.;To learn and understand importance of maintaining oxygen saturations>88%;To learn and demonstrate proper pursed lip breathing techniques or other breathing techniques.;To learn and demonstrate proper use of respiratory medications  To learn and exhibit compliance with exercise, home and travel O2 prescription;To learn and understand importance of monitoring SPO2 with pulse oximeter and demonstrate accurate use of the pulse oximeter.;To learn and understand importance of maintaining oxygen saturations>88%;To learn and demonstrate proper pursed lip breathing techniques or other breathing techniques.;To learn and demonstrate proper use of respiratory medications      Long  Term Goals  Compliance with respiratory medication;Exhibits proper breathing techniques, such as pursed lip breathing or other method taught during program session;Maintenance of O2 saturations>88%;Verbalizes importance of monitoring SPO2 with pulse oximeter and return demonstration;Exhibits compliance with exercise, home and travel O2 prescription  Compliance with respiratory medication;Exhibits proper breathing techniques, such as pursed lip breathing or other method taught during program session;Maintenance of O2 saturations>88%;Verbalizes importance of monitoring SPO2 with pulse oximeter and return demonstration;Exhibits compliance with exercise, home and travel O2 prescription      Goals/Expected Outcomes  compliance  compliance          Oxygen Discharge (Final Oxygen Re-Evaluation): Oxygen Re-Evaluation - 04/20/19 0955      Program Oxygen Prescription   Program Oxygen Prescription  Continuous    Liters per minute  2      Home Oxygen   Home Oxygen Device  Home Concentrator;Portable Concentrator;E-Tanks    Sleep Oxygen Prescription  Continuous    Liters per minute  2    Home Exercise Oxygen Prescription  Continuous    Liters per minute  2    Home at Rest Exercise Oxygen Prescription  Pulsed    Liters per minute  2    Compliance with Home Oxygen Use  No      Goals/Expected Outcomes   Short Term Goals  To learn and exhibit compliance with exercise, home and travel O2 prescription;To learn and understand importance of monitoring SPO2 with pulse oximeter and demonstrate accurate use of the pulse oximeter.;To learn and understand importance of maintaining oxygen saturations>88%;To learn and demonstrate proper pursed lip breathing techniques or other breathing techniques.;To learn and demonstrate proper use of respiratory medications    Long  Term Goals  Compliance with respiratory medication;Exhibits proper breathing techniques, such as pursed lip breathing or other method taught during program session;Maintenance of O2 saturations>88%;Verbalizes importance of monitoring SPO2 with pulse oximeter and return demonstration;Exhibits compliance with exercise, home and travel O2 prescription    Goals/Expected Outcomes  compliance       Initial Exercise Prescription: Initial Exercise Prescription - 03/02/19 1100      Date of Initial Exercise RX and Referring Provider   Date  03/02/19    Referring Provider  Dr. Marchelle Gearing      Oxygen   Oxygen  Continuous    Liters  3      Recumbant Bike   Level  1    Minutes  15    METs  2      NuStep   Level  2    SPM  80    Minutes  15      Prescription Details   Frequency (times per week)  2    Duration  Progress to 30 minutes of continuous aerobic without  signs/symptoms of physical distress      Intensity   THRR 40-80% of Max Heartrate  72-145    Ratings of Perceived Exertion  11-13    Perceived Dyspnea  0-4      Progression   Progression  Continue to progress workloads to maintain intensity without signs/symptoms of physical distress.      Resistance Training   Training Prescription  Yes    Weight  orange bands    Reps  10-15       Perform Capillary Blood Glucose checks as needed.  Exercise Prescription Changes: Exercise Prescription Changes    Row Name 03/10/19 1400 03/24/19 1500 03/26/19 1400 04/07/19 1500       Response to Exercise   Blood Pressure (Admit)  100/60  106/70  --  110/60    Blood Pressure (Exercise)  132/86  120/80  --  122/64    Blood Pressure (Exit)  102/72  106/88  --  96/70    Heart Rate (Admit)  99 bpm  112 bpm  --  91 bpm    Heart Rate (Exercise)  118 bpm  138 bpm  --  105 bpm    Heart Rate (Exit)  105 bpm  113 bpm  --  95 bpm    Oxygen Saturation (Admit)  99 %  97 %  --  94 %    Oxygen Saturation (Exercise)  94 %  95 %  --  95 %    Oxygen Saturation (Exit)  98 %  96 %  --  97 %    Rating of Perceived Exertion (Exercise)  15  17  --  15    Perceived Dyspnea (Exercise)  3  2  --  3    Duration  Continue with 30 min of aerobic exercise without signs/symptoms of physical distress.  Continue with 30 min of aerobic exercise without signs/symptoms of physical distress.  --  Continue with 30 min of aerobic exercise without signs/symptoms of physical distress.    Intensity  THRR unchanged  THRR unchanged  --  THRR unchanged      Progression   Progression  Continue to progress workloads to maintain intensity without signs/symptoms of physical distress.  Continue to progress workloads to maintain intensity without signs/symptoms of physical distress.  --  Continue to progress workloads to maintain intensity without signs/symptoms of physical distress.      Resistance Training   Training Prescription  Yes  Yes   --  Yes    Weight  orange bands  orange bands  --  orange bands    Reps  10-15  10-15  --  10-15    Time  10 Minutes  10 Minutes  --  10 Minutes      Oxygen   Oxygen  Continuous  Continuous  --  Continuous    Liters  2  2  --  2      Recumbant Bike   Level  1  1  --  1    Minutes  15  15  --  15    METs  1.8  --  --  --      NuStep   Level  2  2  --  3    SPM  80  80  --  80    Minutes  15  15  --  15    METs  2  1.8  --  1.9      Home Exercise Plan   Plans to continue exercise at  --  --  Home (comment)  --    Frequency  --  --  Add 2 additional days to program exercise sessions.  --    Initial Home Exercises Provided  --  --  03/26/19  --       Exercise Comments: Exercise Comments    Row Name 03/26/19 1447           Exercise Comments  home exercise complete          Exercise Goals and Review: Exercise Goals    Row Name 03/02/19 1100 03/24/19 0715 04/20/19 0955         Exercise Goals   Increase Physical Activity  Yes  Yes  Yes     Intervention  Provide advice, education, support and counseling about physical activity/exercise needs.;Develop an individualized exercise prescription for aerobic and resistive training based on initial evaluation findings, risk stratification, comorbidities and participant's personal goals.  Provide advice, education, support and counseling about physical activity/exercise needs.;Develop an individualized exercise prescription for aerobic and resistive training based on initial evaluation findings, risk stratification, comorbidities and participant's personal goals.  Provide advice, education, support and counseling about physical activity/exercise needs.;Develop an individualized exercise prescription for aerobic and resistive training based on initial evaluation findings, risk stratification, comorbidities and participant's personal goals.     Expected Outcomes  Short Term: Attend rehab on a regular basis to increase amount of physical  activity.;Long Term: Add in home exercise to  make exercise part of routine and to increase amount of physical activity.;Long Term: Exercising regularly at least 3-5 days a week.  Short Term: Attend rehab on a regular basis to increase amount of physical activity.;Long Term: Add in home exercise to make exercise part of routine and to increase amount of physical activity.;Long Term: Exercising regularly at least 3-5 days a week.  Short Term: Attend rehab on a regular basis to increase amount of physical activity.;Long Term: Add in home exercise to make exercise part of routine and to increase amount of physical activity.;Long Term: Exercising regularly at least 3-5 days a week.     Increase Strength and Stamina  Yes  Yes  Yes     Intervention  Provide advice, education, support and counseling about physical activity/exercise needs.;Develop an individualized exercise prescription for aerobic and resistive training based on initial evaluation findings, risk stratification, comorbidities and participant's personal goals.  Provide advice, education, support and counseling about physical activity/exercise needs.;Develop an individualized exercise prescription for aerobic and resistive training based on initial evaluation findings, risk stratification, comorbidities and participant's personal goals.  Provide advice, education, support and counseling about physical activity/exercise needs.;Develop an individualized exercise prescription for aerobic and resistive training based on initial evaluation findings, risk stratification, comorbidities and participant's personal goals.     Expected Outcomes  Short Term: Increase workloads from initial exercise prescription for resistance, speed, and METs.  Short Term: Increase workloads from initial exercise prescription for resistance, speed, and METs.  Short Term: Increase workloads from initial exercise prescription for resistance, speed, and METs.     Able to understand and  use rate of perceived exertion (RPE) scale  Yes  Yes  Yes     Intervention  Provide education and explanation on how to use RPE scale  Provide education and explanation on how to use RPE scale  Provide education and explanation on how to use RPE scale     Expected Outcomes  Short Term: Able to use RPE daily in rehab to express subjective intensity level;Long Term:  Able to use RPE to guide intensity level when exercising independently  Short Term: Able to use RPE daily in rehab to express subjective intensity level;Long Term:  Able to use RPE to guide intensity level when exercising independently  Short Term: Able to use RPE daily in rehab to express subjective intensity level;Long Term:  Able to use RPE to guide intensity level when exercising independently     Able to understand and use Dyspnea scale  Yes  Yes  Yes     Intervention  Provide education and explanation on how to use Dyspnea scale  Provide education and explanation on how to use Dyspnea scale  Provide education and explanation on how to use Dyspnea scale     Expected Outcomes  Short Term: Able to use Dyspnea scale daily in rehab to express subjective sense of shortness of breath during exertion;Long Term: Able to use Dyspnea scale to guide intensity level when exercising independently  Short Term: Able to use Dyspnea scale daily in rehab to express subjective sense of shortness of breath during exertion;Long Term: Able to use Dyspnea scale to guide intensity level when exercising independently  Short Term: Able to use Dyspnea scale daily in rehab to express subjective sense of shortness of breath during exertion;Long Term: Able to use Dyspnea scale to guide intensity level when exercising independently     Knowledge and understanding of Target Heart Rate Range (THRR)  Yes  Yes  Yes  Intervention  Provide education and explanation of THRR including how the numbers were predicted and where they are located for reference  Provide education and  explanation of THRR including how the numbers were predicted and where they are located for reference  Provide education and explanation of THRR including how the numbers were predicted and where they are located for reference     Expected Outcomes  Short Term: Able to state/look up THRR;Long Term: Able to use THRR to govern intensity when exercising independently;Short Term: Able to use daily as guideline for intensity in rehab  Short Term: Able to state/look up THRR;Long Term: Able to use THRR to govern intensity when exercising independently;Short Term: Able to use daily as guideline for intensity in rehab  Short Term: Able to state/look up THRR;Long Term: Able to use THRR to govern intensity when exercising independently;Short Term: Able to use daily as guideline for intensity in rehab     Understanding of Exercise Prescription  Yes  Yes  Yes     Intervention  Provide education, explanation, and written materials on patient's individual exercise prescription  Provide education, explanation, and written materials on patient's individual exercise prescription  Provide education, explanation, and written materials on patient's individual exercise prescription     Expected Outcomes  Short Term: Able to explain program exercise prescription;Long Term: Able to explain home exercise prescription to exercise independently  Short Term: Able to explain program exercise prescription;Long Term: Able to explain home exercise prescription to exercise independently  Short Term: Able to explain program exercise prescription;Long Term: Able to explain home exercise prescription to exercise independently        Exercise Goals Re-Evaluation : Exercise Goals Re-Evaluation    Row Name 03/24/19 0715 04/20/19 0956           Exercise Goal Re-Evaluation   Exercise Goals Review  Increase Physical Activity;Increase Strength and Stamina;Able to understand and use rate of perceived exertion (RPE) scale;Able to understand and  use Dyspnea scale;Knowledge and understanding of Target Heart Rate Range (THRR);Understanding of Exercise Prescription  Increase Physical Activity;Increase Strength and Stamina;Able to understand and use rate of perceived exertion (RPE) scale;Able to understand and use Dyspnea scale;Knowledge and understanding of Target Heart Rate Range (THRR);Understanding of Exercise Prescription      Comments  Pt has attended 4 exercise sessions. Pt was severely deconditioned upon entering the program. Pt has a very positive outlook and is motivated to make progress. Pt currently exercises at 1.9 METs on the stepper. Will continue to monitor and progress as able.  Pt has attended 10 exercise sessions. Progress has been slow, but pt maintains a positive attitude and is a pleasure to work with. Pt currently exercises at 1.9 METs on the stepper. Will continue to monitor and progress as able.      Expected Outcomes  Through exercise at rehab and at home, the patient will decrease shortness of breath with daily activities and feel confident in carrying out an exercise regime at home.  Through exercise at rehab and at home, the patient will decrease shortness of breath with daily activities and feel confident in carrying out an exercise regime at home.         Discharge Exercise Prescription (Final Exercise Prescription Changes): Exercise Prescription Changes - 04/07/19 1500      Response to Exercise   Blood Pressure (Admit)  110/60    Blood Pressure (Exercise)  122/64    Blood Pressure (Exit)  96/70    Heart Rate (Admit)  91 bpm    Heart Rate (Exercise)  105 bpm    Heart Rate (Exit)  95 bpm    Oxygen Saturation (Admit)  94 %    Oxygen Saturation (Exercise)  95 %    Oxygen Saturation (Exit)  97 %    Rating of Perceived Exertion (Exercise)  15    Perceived Dyspnea (Exercise)  3    Duration  Continue with 30 min of aerobic exercise without signs/symptoms of physical distress.    Intensity  THRR unchanged       Progression   Progression  Continue to progress workloads to maintain intensity without signs/symptoms of physical distress.      Resistance Training   Training Prescription  Yes    Weight  orange bands    Reps  10-15    Time  10 Minutes      Oxygen   Oxygen  Continuous    Liters  2      Recumbant Bike   Level  1    Minutes  15      NuStep   Level  3    SPM  80    Minutes  15    METs  1.9       Nutrition:  Target Goals: Understanding of nutrition guidelines, daily intake of sodium 1500mg , cholesterol 200mg , calories 30% from fat and 7% or less from saturated fats, daily to have 5 or more servings of fruits and vegetables.  Biometrics: Pre Biometrics - 03/02/19 0952      Pre Biometrics   Height  5' (1.524 m)    Weight  104.2 kg    BMI (Calculated)  44.86    Grip Strength  25 kg        Nutrition Therapy Plan and Nutrition Goals: Nutrition Therapy & Goals - 03/12/19 1445      Nutrition Therapy   Diet  Mediterranean/Low Sodium      Personal Nutrition Goals   Nutrition Goal  Pt to increase whole grains vs refined grains for better blood sugar control    Personal Goal #2  Pt to continue limiting sodium to 1500 mg/day    Comments  Pt with hx of eating disorder - do not recommend restrictive diets      Intervention Plan   Expected Outcomes  Short Term Goal: A plan has been developed with personal nutrition goals set during dietitian appointment.;Long Term Goal: Adherence to prescribed nutrition plan.       Nutrition Assessments: Nutrition Assessments - 03/12/19 1503      Rate Your Plate Scores   Pre Score  60       Nutrition Goals Re-Evaluation: Nutrition Goals Re-Evaluation    Row Name 03/12/19 1455 04/20/19 1021           Goals   Current Weight  229 lb 11.5 oz (104.2 kg)  228 lb 9.9 oz (103.7 kg)      Nutrition Goal  Pt to increase whole grains vs refined grains for better blood sugar control  Pt to increase whole grains vs refined grains for  better blood sugar control        Personal Goal #2 Re-Evaluation   Personal Goal #2  Pt to continue limiting sodium to 1500 mg/day  Pt to continue limiting sodium to 1500 mg/day         Nutrition Goals Discharge (Final Nutrition Goals Re-Evaluation): Nutrition Goals Re-Evaluation - 04/20/19 1021      Goals   Current Weight  228 lb 9.9 oz (103.7 kg)    Nutrition Goal  Pt to increase whole grains vs refined grains for better blood sugar control      Personal Goal #2 Re-Evaluation   Personal Goal #2  Pt to continue limiting sodium to 1500 mg/day       Psychosocial: Target Goals: Acknowledge presence or absence of significant depression and/or stress, maximize coping skills, provide positive support system. Participant is able to verbalize types and ability to use techniques and skills needed for reducing stress and depression.  Initial Review & Psychosocial Screening: Initial Psych Review & Screening - 03/02/19 1610      Initial Review   Current issues with  Current Depression;History of Depression;Current Stress Concerns    Source of Stress Concerns  Chronic Illness;Unable to perform yard/household activities      Family Dynamics   Good Support System?  Yes      Barriers   Psychosocial barriers to participate in program  The patient should benefit from training in stress management and relaxation.      Screening Interventions   Interventions  Encouraged to exercise       Quality of Life Scores:  Scores of 19 and below usually indicate a poorer quality of life in these areas.  A difference of  2-3 points is a clinically meaningful difference.  A difference of 2-3 points in the total score of the Quality of Life Index has been associated with significant improvement in overall quality of life, self-image, physical symptoms, and general health in studies assessing change in quality of life.  PHQ-9: Recent Review Flowsheet Data    Depression screen [facility] 2/9 03/02/2019 06/09/2018  04/16/2018 08/07/2017   Decreased Interest 0 3 3 2    Down, Depressed, Hopeless 1 3 3 3    PHQ - 2 Score 1 6 6 5    Altered sleeping 1 2 2 2    Tired, decreased energy 2 3 3 3    Change in appetite 2 2 2 3    Feeling bad or failure about yourself  2 3 3 3    Trouble concentrating 0 2 2 2    Moving slowly or fidgety/restless 2 2 2 2    Suicidal thoughts 0 0 0 0   PHQ-9 Score - 20 20 20    Difficult doing work/chores Not difficult at all Very difficult - Very difficult     Interpretation of Total Score  Total Score Depression Severity:  1-4 = Minimal depression, 5-9 = Mild depression, 10-14 = Moderate depression, 15-19 = Moderately severe depression, 20-27 = Severe depression   Psychosocial Evaluation and Intervention: Psychosocial Evaluation - 03/02/19 1004      Psychosocial Evaluation & Interventions   Interventions  Relaxation education;Stress management education    Continue Psychosocial Services   Follow up required by counselor   Patient is seeing a counsellor biweekly      Psychosocial Re-Evaluation: Psychosocial Re-Evaluation    Row Name 03/02/19 1009 03/23/19 1206 04/20/19 1221         Psychosocial Re-Evaluation   Current issues with  Current Depression;History of Depression;Current Stress Concerns  Current Stress Concerns;History of Depression;Current Depression  Current Anxiety/Panic;History of Depression;Current Depression;Current Stress Concerns     Comments  --  Depression is stable at this time, she just started the program and has attended 4 exercise sessions.  At this time she has no barriers and is dealing with stressors as best she can.  She sees a therapist bi-weekly.  Meredith Diaz is very hard on herself  and negative, she has been told by 2 physicians that she will die if she does not lose a large amount of weight.  She tries, but does not get any results.  Emotional support given.     Expected Outcomes  --  For Meredith Diaz to cope with her stressors in a healthy manner.  --      Interventions  --  Stress management education;Encouraged to attend Pulmonary Rehabilitation for the exercise;Relaxation education  Stress management education;Encouraged to attend Pulmonary Rehabilitation for the exercise;Relaxation education Meredith Diaz sees a therapist every 2 weeks     Continue Psychosocial Services   --  Follow up required by counselor f/u by the therapist she sees bi-weekly.  Follow up required by counselor     Comments  --  These stressors are ongoing, patient is tolerating well.  --       Initial Review   Source of Stress Concerns  --  Chronic Illness;Unable to participate in former interests or hobbies;Unable to perform yard/household activities  Chronic Illness;Unable to participate in former interests or hobbies;Unable to perform yard/household activities        Psychosocial Discharge (Final Psychosocial Re-Evaluation): Psychosocial Re-Evaluation - 04/20/19 1221      Psychosocial Re-Evaluation   Current issues with  Current Anxiety/Panic;History of Depression;Current Depression;Current Stress Concerns    Comments  Meredith Diaz is very hard on herself and negative, she has been told by 2 physicians that she will die if she does not lose a large amount of weight.  She tries, but does not get any results.  Emotional support given.    Interventions  Stress management education;Encouraged to attend Pulmonary Rehabilitation for the exercise;Relaxation education   Meredith Diaz sees a therapist every 2 weeks   Continue Psychosocial Services   Follow up required by counselor      Initial Review   Source of Stress Concerns  Chronic Illness;Unable to participate in former interests or hobbies;Unable to perform yard/household activities       Education: Education Goals: Education classes will be provided on a weekly basis, covering required topics. Participant will state understanding/return demonstration of topics presented.  Learning Barriers/Preferences: Learning Barriers/Preferences -  03/02/19 1009      Learning Barriers/Preferences   Learning Barriers  None    Learning Preferences  Individual Instruction;Computer/Internet;Written Material       Education Topics: Risk Factor Reduction:  -Group instruction that is supported by a PowerPoint presentation. Instructor discusses the definition of a risk factor, different risk factors for pulmonary disease, and how the heart and lungs work together.     PULMONARY REHAB OTHER RESPIRATORY from 04/07/2019 in [facility] CARDIAC REHAB  Date  03/31/19  Educator  DF  Instruction Review Code  2- Demonstrated Understanding      Nutrition for Pulmonary Patient:  -Group instruction provided by PowerPoint slides, verbal discussion, and written materials to support subject matter. The instructor gives an explanation and review of healthy diet recommendations, which includes a discussion on weight management, recommendations for fruit and vegetable consumption, as well as protein, fluid, caffeine, fiber, sodium, sugar, and alcohol. Tips for eating when patients are short of breath are discussed.   Pursed Lip Breathing:  -Group instruction that is supported by demonstration and informational handouts. Instructor discusses the benefits of pursed lip and diaphragmatic breathing and detailed demonstration on how to preform both.     Oxygen Safety:  -Group instruction provided by PowerPoint, verbal discussion, and written material to support subject matter. There is  an overview of "What is Oxygen" and "Why do we need it".  Instructor also reviews how to create a safe environment for oxygen use, the importance of using oxygen as prescribed, and the risks of noncompliance. There is a brief discussion on traveling with oxygen and resources the patient may utilize.   Oxygen Equipment:  -Group instruction provided by [facility] Staff utilizing handouts, written materials, and equipment demonstrations.   Signs and Symptoms:   -Group instruction provided by written material and verbal discussion to support subject matter. Warning signs and symptoms of infection, stroke, and heart attack are reviewed and when to call the physician/911 reinforced. Tips for preventing the spread of infection discussed.   Advanced Directives:  -Group instruction provided by verbal instruction and written material to support subject matter. Instructor reviews Advanced Directive laws and proper instruction for filling out document.   Pulmonary Video:  -Group video education that reviews the importance of medication and oxygen compliance, exercise, good nutrition, pulmonary hygiene, and pursed lip and diaphragmatic breathing for the pulmonary patient.   Exercise for the Pulmonary Patient:  -Group instruction that is supported by a PowerPoint presentation. Instructor discusses benefits of exercise, core components of exercise, frequency, duration, and intensity of an exercise routine, importance of utilizing pulse oximetry during exercise, safety while exercising, and options of places to exercise outside of rehab.     Pulmonary Medications:  -Verbally interactive group education provided by instructor with focus on inhaled medications and proper administration.   Anatomy and Physiology of the Respiratory System and Intimacy:  -Group instruction provided by PowerPoint, verbal discussion, and written material to support subject matter. Instructor reviews respiratory cycle and anatomical components of the respiratory system and their functions. Instructor also reviews differences in obstructive and restrictive respiratory diseases with examples of each. Intimacy, Sex, and Sexuality differences are reviewed with a discussion on how relationships can change when diagnosed with pulmonary disease. Common sexual concerns are reviewed.   PULMONARY REHAB OTHER RESPIRATORY from 04/07/2019 in [facility] CARDIAC REHAB  Date  04/07/19   Educator  -- [Handout]      MD DAY -A group question and answer session with a medical doctor that allows participants to ask questions that relate to their pulmonary disease state.   OTHER EDUCATION -Group or individual verbal, written, or video instructions that support the educational goals of the pulmonary rehab program.   Holiday Eating Survival Tips:  -Group instruction provided by PowerPoint slides, verbal discussion, and written materials to support subject matter. The instructor gives patients tips, tricks, and techniques to help them not only survive but enjoy the holidays despite the onslaught of food that accompanies the holidays.   PULMONARY REHAB OTHER RESPIRATORY from 04/07/2019 in [facility] CARDIAC REHAB  Date  03/24/19  Educator  -- [Handout]      Knowledge Questionnaire Score: Knowledge Questionnaire Score - 03/02/19 1101      Knowledge Questionnaire Score   Pre Score  17/18       Core Components/Risk Factors/Patient Goals at Admission: Personal Goals and Risk Factors at Admission - 03/02/19 1009      Core Components/Risk Factors/Patient Goals on Admission   Heart Failure  Yes    Expected Outcomes  Improve functional capacity of life;Short term: Attendance in program 2-3 days a week with increased exercise capacity. Reported lower sodium intake. Reported increased fruit and vegetable intake. Reports medication compliance.;Short term: Daily weights obtained and reported for increase. Utilizing diuretic protocols set by  physician.;Long term: Adoption of self-care skills and reduction of barriers for early signs and symptoms recognition and intervention leading to self-care maintenance.    Stress  Yes    Intervention  Offer individual and/or small group education and counseling on adjustment to heart disease, stress management and health-related lifestyle change. Teach and support self-help strategies.;Refer participants experiencing significant  psychosocial distress to appropriate mental health specialists for further evaluation and treatment. When possible, include family members and significant others in education/counseling sessions.       Core Components/Risk Factors/Patient Goals Review:  Goals and Risk Factor Review    Row Name 03/02/19 1013 03/23/19 1210 04/20/19 1226         Core Components/Risk Factors/Patient Goals Review   Personal Goals Review  Increase knowledge of respiratory medications and ability to use respiratory devices properly.;Improve shortness of breath with ADL's;Develop more efficient breathing techniques such as purse lipped breathing and diaphragmatic breathing and practicing self-pacing with activity.;Heart Failure  Develop more efficient breathing techniques such as purse lipped breathing and diaphragmatic breathing and practicing self-pacing with activity.;Heart Failure;Increase knowledge of respiratory medications and ability to use respiratory devices properly.;Improve shortness of breath with ADL's  Develop more efficient breathing techniques such as purse lipped breathing and diaphragmatic breathing and practicing self-pacing with activity.;Increase knowledge of respiratory medications and ability to use respiratory devices properly.;Stress;Heart Failure;Improve shortness of breath with ADL's     Review  --  Meredith Diaz just started the program and has attended 4 exercise sessions, too early to see progression towards goals.  Her heart failure is stable, she is exercising on the nustep at level 3, and the recumbent bike on level 1, she is slow to progress, but attendance is great.     Expected Outcomes  --  See admission goals.  See admission goals.        Core Components/Risk Factors/Patient Goals at Discharge (Final Review):  Goals and Risk Factor Review - 04/20/19 1226      Core Components/Risk Factors/Patient Goals Review   Personal Goals Review  Develop more efficient breathing techniques such as purse  lipped breathing and diaphragmatic breathing and practicing self-pacing with activity.;Increase knowledge of respiratory medications and ability to use respiratory devices properly.;Stress;Heart Failure;Improve shortness of breath with ADL's    Review  Her heart failure is stable, she is exercising on the nustep at level 3, and the recumbent bike on level 1, she is slow to progress, but attendance is great.    Expected Outcomes  See admission goals.       ITP Comments: ITP Comments    Row Name 01/07/19 1442           ITP Comments  Patient was transitioned to virtual cardiac rehab program. Patient has completed use of the app.          Comments: ITP REVIEW Pt is making expected progress toward pulmonary rehab goals after completing 10 sessions. Recommend continued exercise, life style modification, education, and utilization of breathing techniques to increase stamina and strength and decrease shortness of breath with exertion.

## 2019-04-23 ENCOUNTER — Encounter (HOSPITAL_COMMUNITY)
Admission: RE | Admit: 2019-04-23 | Discharge: 2019-04-23 | Disposition: A | Discharge status: Home / Self Care | Payer: Commercial Managed Care - PPO | Source: Ambulatory Visit | Attending: Internal Medicine | Admitting: Internal Medicine

## 2019-04-23 ENCOUNTER — Other Ambulatory Visit: Payer: Self-pay

## 2019-04-23 DIAGNOSIS — R0600 Dyspnea, unspecified: Secondary | ICD-10-CM | POA: Diagnosis not present

## 2019-04-23 DIAGNOSIS — I5042 Chronic combined systolic (congestive) and diastolic (congestive) heart failure: Secondary | ICD-10-CM

## 2019-04-23 NOTE — Progress Notes (Signed)
Daily Session Note  Patient Details  Name: Meredith Diaz MRN: [id number] Date of Birth: 1980/11/08 Referring Provider:     Pulmonary Rehab Walk Test from 03/02/2019 in [place]  Referring Provider  Dr. Chase Caller      Encounter Date: 04/23/2019  Check In: Session Check In - 04/23/19 1439      Check-In   Supervising physician immediately available to respond to emergencies  [facility] immediately available    Physician(s)  Dr. Sloan Leiter    Location  [facility]    Staff Present  Rosebud Poles, RN, Bjorn Loser, MS, Exercise Physiologist;Wilburt Messina Ysidro Evert, RN    Virtual Visit  No    Medication changes reported      No    Fall or balance concerns reported     No    Tobacco Cessation  No Change    Warm-up and Cool-down  Performed on first and last piece of equipment    Resistance Training Performed  Yes    VAD Patient?  No    PAD/SET Patient?  No      Pain Assessment   Currently in Pain?  No/denies    Multiple Pain Sites  No       Capillary Blood Glucose: No results found for this or any previous visit (from the past 24 hour(s)).    Social History   Tobacco Use  Smoking Status Never Smoker  Smokeless Tobacco Never Used    Goals Met:  Exercise tolerated well No report of cardiac concerns or symptoms Strength training completed today  Goals Unmet:  Not Applicable  Comments: Service time is from 1245 to 1350    Dr. Rush Farmer is Medical Director for Pulmonary Rehab at [facility].

## 2019-04-24 ENCOUNTER — Other Ambulatory Visit: Payer: Commercial Managed Care - PPO

## 2019-04-24 ENCOUNTER — Other Ambulatory Visit (HOSPITAL_COMMUNITY): Payer: Self-pay

## 2019-04-24 ENCOUNTER — Other Ambulatory Visit (HOSPITAL_COMMUNITY)
Admission: RE | Admit: 2019-04-24 | Discharge: 2019-04-24 | Disposition: A | Discharge status: Home / Self Care | Payer: Commercial Managed Care - PPO | Source: Ambulatory Visit | Attending: Pulmonary Disease | Admitting: Pulmonary Disease

## 2019-04-24 DIAGNOSIS — Z20828 Contact with and (suspected) exposure to other viral communicable diseases: Secondary | ICD-10-CM | POA: Insufficient documentation

## 2019-04-24 MED ORDER — VALSARTAN 80 MG PO TABS: 80.0000 mg | ORAL_TABLET | Freq: Every day | ORAL | 6 refills | Status: DC

## 2019-04-25 LAB — NOVEL CORONAVIRUS, NAA (HOSP ORDER, SEND-OUT TO REF LAB; TAT 18-24 HRS): SARS-CoV-2, NAA: NOT DETECTED

## 2019-04-28 ENCOUNTER — Ambulatory Visit (INDEPENDENT_AMBULATORY_CARE_PROVIDER_SITE_OTHER): Payer: Commercial Managed Care - PPO | Admitting: Pulmonary Disease

## 2019-04-28 ENCOUNTER — Encounter: Payer: Self-pay | Admitting: Pulmonary Disease

## 2019-04-28 ENCOUNTER — Encounter (HOSPITAL_COMMUNITY): Payer: Commercial Managed Care - PPO

## 2019-04-28 ENCOUNTER — Other Ambulatory Visit: Payer: Self-pay

## 2019-04-28 VITALS — BP 122/62 | HR 86 | Temp 97.7°F | Ht 60.0 in | Wt 227.0 lb

## 2019-04-28 DIAGNOSIS — G4734 Idiopathic sleep related nonobstructive alveolar hypoventilation: Secondary | ICD-10-CM

## 2019-04-28 DIAGNOSIS — R0902 Hypoxemia: Secondary | ICD-10-CM | POA: Diagnosis not present

## 2019-04-28 DIAGNOSIS — R0602 Shortness of breath: Secondary | ICD-10-CM

## 2019-04-28 DIAGNOSIS — G4733 Obstructive sleep apnea (adult) (pediatric): Secondary | ICD-10-CM | POA: Diagnosis not present

## 2019-04-28 DIAGNOSIS — Z9989 Dependence on other enabling machines and devices: Secondary | ICD-10-CM

## 2019-04-28 DIAGNOSIS — J984 Other disorders of lung: Secondary | ICD-10-CM

## 2019-04-28 LAB — PULMONARY FUNCTION TEST
DL/VA % pred: 170 %
DL/VA: 7.83 ml/min/mmHg/L
DLCO unc % pred: 63 %
DLCO unc: 12.04 ml/min/mmHg
FEF 25-75 Post: 0.96 L/s
FEF 25-75 Pre: 0.73 L/s
FEF2575-%Change-Post: 32 %
FEF2575-%Pred-Post: 32 %
FEF2575-%Pred-Pre: 24 %
FEV1-%Change-Post: 7 %
FEV1-%Pred-Post: 28 %
FEV1-%Pred-Pre: 26 %
FEV1-Post: 0.77 L
FEV1-Pre: 0.72 L
FEV1FVC-%Change-Post: 2 %
FEV1FVC-%Pred-Pre: 103 %
FEV6-%Change-Post: 4 %
FEV6-%Pred-Post: 27 %
FEV6-%Pred-Pre: 26 %
FEV6-Post: 0.88 L
FEV6-Pre: 0.84 L
FEV6FVC-%Pred-Post: 101 %
FEV6FVC-%Pred-Pre: 101 %
FVC-%Change-Post: 4 %
FVC-%Pred-Post: 27 %
FVC-%Pred-Pre: 25 %
FVC-Post: 0.88 L
FVC-Pre: 0.84 L
Post FEV1/FVC ratio: 88 %
Post FEV6/FVC ratio: 100 %
Pre FEV1/FVC ratio: 86 %
Pre FEV6/FVC Ratio: 100 %
RV % pred: 60 %
RV: 0.83 L
TLC % pred: 41 %
TLC: 1.85 L

## 2019-04-28 MED ORDER — ALBUTEROL SULFATE HFA 108 (90 BASE) MCG/ACT IN AERS: INHALATION_SPRAY | RESPIRATORY_TRACT | 3 refills | Status: DC

## 2019-04-28 MED ORDER — BUDESONIDE-FORMOTEROL FUMARATE 160-4.5 MCG/ACT IN AERO: INHALATION_SPRAY | RESPIRATORY_TRACT | 5 refills | Status: DC

## 2019-04-28 NOTE — Progress Notes (Signed)
Subjective:    Patient ID: Meredith Diaz, female    DOB: 1981/03/17, 38 y.o.   MRN: [id number]  Patient with a history of obstructive sleep apnea  Has not been using CPAP Breathing feels about the same She is getting more active  Shortness of breath with activity She continues to have mask issues Multiple comorbidities including systolic and diastolic congestive heart failure, hypothyroidism, chronic fatigue, kyphoscoliosis  Diagnosed with obstructive sleep apnea in 2016 Usually goes to bed about 10 PM to midnight Takes her between 10 and 30 minutes to fall asleep Rarely wakes up in the middle of the night  Final awakening time about 8 AM  Has not been very compliant with CPAP use secondary to mask issues  Was recently seen by Dr. Marchelle Gearing for possible interstitial lung disease High-resolution CT scan of the chest was not confirmatory of interstitial lung disease  PFT from 2016 did reveal severe restrictive disease Significant decrease in lung volumes also noted on CT scan of the chest Significant subcutaneous tissue, mediastinal lipomatosis  She has been referred to pulmonary rehab, referred to bariatric surgery as well    Past Medical History:  Diagnosis Date  . Anxiety   . Asthma   . Back pain   . Chronic combined systolic and diastolic CHF (congestive heart failure) (HCC) 07/2011   a. Prior Echo: EF was 25-30%; b. No Sig CAD on CATH; c. No significant finding on Cardiac MRI; d. follow-up Echo 2014 --  EF of 50%;  e. 03/2014 Echo: EF 40-45%, gr 1 DD; f. 11/2014 Echo: EF 40-45%, Gr 2 DD.  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  . Depression   . Dyspnea   . Essential hypertension   . Family history of breast cancer   . Family history of colon cancer   . Fibromyalgia   . Gallbladder problem   .  Hypothyroidism   . IBS (irritable bowel syndrome)   . Morbid obesity (HCC)   . Nonischemic cardiomyopathy (HCC)    a. unclear etiology;  b. 01/2012 Cardiac MRI: no infiltrative pathology, EF 52%;  b. 11/2014 Echo: EF 40-45%, gr 2 DD.  [name] Scoliosis   . Sleep apnea   . Stomach problems   . Thyroid disease   . Vitamin B 12 deficiency   . Vitamin D deficiency    Social History   Socioeconomic History  . Marital status: Married    Spouse name: [name] Rieger  . Number of children: 0  . Years of education: Not on file  . Highest education level: Not on file  Occupational History  . Occupation: Programmer, multimedia  Tobacco Use  . Smoking status: Never Smoker  . Smokeless tobacco: Never Used  Substance and Sexual Activity  . Alcohol use: No  . Drug use: No  . Sexual activity: Not on file  Other Topics Concern  . Not on file  Social History Narrative  . Not on file   Social Determinants of Health   Financial Resource Strain:   . Difficulty of Paying Living Expenses: Not on file  Food Insecurity:   . Worried About Programme researcher, broadcasting/film/video in the Last Year: Not on file  . Ran Out of Food in the Last Year: Not on file  Transportation Needs:   . Lack of Transportation (Medical): Not on  file  . Lack of Transportation (Non-Medical): Not on file  Physical Activity:   . Days of Exercise per Week: Not on file  . Minutes of Exercise per Session: Not on file  Stress:   . Feeling of Stress : Not on file  Social Connections:   . Frequency of Communication with Friends and Family: Not on file  . Frequency of Social Gatherings with Friends and Family: Not on file  . Attends Religious Services: Not on file  . Active Member of Clubs or Organizations: Not on file  . Attends Archivist Meetings: Not on file  . Marital Status: Not on file  Intimate Partner Violence:   . Fear of Current or Ex-Partner: Not on file  . Emotionally Abused: Not on file  . Physically Abused: Not on file  . Sexually  Abused: Not on file   Family History  Problem Relation Age of Onset  . Depression Mother   . Alcoholism Mother   . Hypertension Father   . Heart disease Father   . Colon cancer Maternal Grandmother 64  . Heart attack Maternal Grandmother   . Heart disease Maternal Grandfather   . Heart attack Maternal Grandfather   . Heart disease Paternal Grandmother   . Breast cancer Paternal Grandmother 45       possible bilateral cancer  . Asthma Paternal Grandmother   . Heart attack Paternal Grandmother   . Heart attack Paternal Grandfather   . Breast cancer Maternal Aunt 27  . Breast cancer Paternal Aunt 59  . Stroke Neg Hx   . Thyroid disease Neg Hx        Review of Systems  Constitutional: Negative for fever and unexpected weight change.  HENT: Negative for congestion, dental problem, ear pain, nosebleeds, postnasal drip, rhinorrhea, sinus pressure, sneezing, sore throat and trouble swallowing.   Eyes: Negative for redness and itching.  Respiratory: Positive for shortness of breath. Negative for cough, chest tightness and wheezing.   Cardiovascular: Negative for palpitations and leg swelling.  Gastrointestinal: Negative for nausea and vomiting.  Genitourinary: Negative for dysuria.  Musculoskeletal: Negative for joint swelling.  Skin: Negative for rash.  Allergic/Immunologic: Negative.  Negative for environmental allergies, food allergies and immunocompromised state.  Neurological: Negative for headaches.  Hematological: Does not bruise/bleed easily.  Psychiatric/Behavioral: Negative for dysphoric mood. The patient is nervous/anxious.        Objective:   Physical Exam Constitutional:      General: She is not in acute distress.    Appearance: She is obese.  HENT:     Head: Normocephalic and atraumatic.     Nose: Nose normal. No congestion.     Mouth/Throat:     Mouth: Mucous membranes are moist.  Eyes:     General:        Right eye: No discharge.        Left eye: No  discharge.     Pupils: Pupils are equal, round, and reactive to light.  Cardiovascular:     Rate and Rhythm: Normal rate and regular rhythm.     Heart sounds: No murmur.  Pulmonary:     Effort: Pulmonary effort is normal. No respiratory distress.     Breath sounds: Normal breath sounds. No stridor. No wheezing or rhonchi.  Musculoskeletal:     Cervical back: Normal range of motion and neck supple. No rigidity.  Skin:    Coloration: Skin is not pale.  Neurological:     Mental Status: She is  alert.  Psychiatric:        Mood and Affect: Mood normal.        Behavior: Behavior normal.    Vitals:   04/28/19 0957  BP: 122/62  Pulse: 86  Temp: 97.7 F (36.5 C)  SpO2: 93%   Results of the Epworth flowsheet 02/25/2019 06/10/2018  Sitting and reading 2 1  Watching TV 2 1  Sitting, inactive in a public place (e.g. a theatre or a meeting) 0 0  As a passenger in a car for an hour without a break 3 3  Lying down to rest in the afternoon when circumstances permit 3 3  Sitting and talking to someone 0 0  Sitting quietly after a lunch without alcohol 1 1  In a car, while stopped for a few minutes in traffic 0 0  Total score 11 9   CT scan of the chest reviewed-10/01/2018-reviewed by myself with the patient, previous CT [address]99 19 also reviewed, 10/10/2016 reviewed  Pulmonary function study from 08/25/2014 reviewed showing severe restriction Repeat PFT shows stable findings-reviewed with the patient  Echocardiogram from 03/11/2018 reviewed showing normal systolic function  Arterial blood gases have revealed hypercapnia    Assessment & Plan:  Obstructive sleep apnea .  Obesity hypoventilation .  Combined systolic and diastolic heart failure .  Cardiomyopathy .  Restrictive lung disease .  Hypothyroidism .  Morbid obesity .  Chronic hypoxemia .  Severe kyphoscoliosis  Plan: Encouraged to continue CPAP use  Encouraged to continue weight loss efforts Graded exercises as tolerated She  continues to follow-up with cardiology  I will see her again in about 6 months  Continue Symbicort and albuterol as needed  Call with any significant concerns

## 2019-04-28 NOTE — Progress Notes (Signed)
PFT done today. 

## 2019-04-28 NOTE — Patient Instructions (Signed)
Obstructive sleep apnea Use CPAP as best as able  Restrictive lung disease Continue physical therapy Exercise as able  Continue inhalers  We will see you in about 6 months Call with significant concerns

## 2019-04-30 ENCOUNTER — Encounter (HOSPITAL_COMMUNITY)
Admission: RE | Admit: 2019-04-30 | Discharge: 2019-04-30 | Disposition: A | Discharge status: Home / Self Care | Payer: Commercial Managed Care - PPO | Source: Ambulatory Visit | Attending: Internal Medicine | Admitting: Internal Medicine

## 2019-04-30 ENCOUNTER — Other Ambulatory Visit: Payer: Self-pay

## 2019-04-30 DIAGNOSIS — R0600 Dyspnea, unspecified: Secondary | ICD-10-CM | POA: Diagnosis not present

## 2019-05-05 ENCOUNTER — Encounter (HOSPITAL_COMMUNITY)
Admission: RE | Admit: 2019-05-05 | Discharge: 2019-05-05 | Disposition: A | Discharge status: Home / Self Care | Payer: Commercial Managed Care - PPO | Source: Ambulatory Visit | Attending: Internal Medicine | Admitting: Internal Medicine

## 2019-05-05 ENCOUNTER — Ambulatory Visit: Payer: Commercial Managed Care - PPO

## 2019-05-05 ENCOUNTER — Other Ambulatory Visit: Payer: Self-pay

## 2019-05-05 ENCOUNTER — Ambulatory Visit
Admission: RE | Admit: 2019-05-05 | Discharge: 2019-05-05 | Disposition: A | Discharge status: Home / Self Care | Payer: Commercial Managed Care - PPO | Source: Ambulatory Visit | Attending: Obstetrics & Gynecology | Admitting: Obstetrics & Gynecology

## 2019-05-05 VITALS — Wt 229.9 lb

## 2019-05-05 DIAGNOSIS — R0600 Dyspnea, unspecified: Secondary | ICD-10-CM | POA: Diagnosis not present

## 2019-05-05 DIAGNOSIS — I5042 Chronic combined systolic (congestive) and diastolic (congestive) heart failure: Secondary | ICD-10-CM

## 2019-05-05 DIAGNOSIS — N6320 Unspecified lump in the left breast, unspecified quadrant: Secondary | ICD-10-CM

## 2019-05-05 NOTE — Progress Notes (Signed)
Daily Session Note  Patient Details  Name: Meredith Diaz MRN: [id number] Date of Birth: [birth year] Referring Provider:     Pulmonary Rehab Walk Test from 03/02/2019 in [place]  Referring Provider  Dr. Chase Caller      Encounter Date: 05/05/2019  Check In: Session Check In - 05/05/19 1323      Check-In   Supervising physician immediately available to respond to emergencies  [facility] immediately available    Physician(s)  Dr. Nevada Crane    Location  [facility]    Staff Present  Rosebud Poles, RN, Bjorn Loser, MS, Exercise Physiologist;Oran Dillenburg Ysidro Evert, RN    Virtual Visit  No    Medication changes reported      No    Fall or balance concerns reported     No    Tobacco Cessation  No Change    Warm-up and Cool-down  Performed on first and last piece of equipment    Resistance Training Performed  Yes    VAD Patient?  No    PAD/SET Patient?  No      Pain Assessment   Currently in Pain?  No/denies    Multiple Pain Sites  No       Capillary Blood Glucose: No results found for this or any previous visit (from the past 24 hour(s)).  Exercise Prescription Changes - 05/05/19 1500      Response to Exercise   Blood Pressure (Admit)  110/80    Blood Pressure (Exercise)  108/70    Blood Pressure (Exit)  104/74    Heart Rate (Admit)  98 bpm    Heart Rate (Exercise)  93 bpm    Heart Rate (Exit)  95 bpm    Oxygen Saturation (Admit)  98 %    Oxygen Saturation (Exercise)  93 %    Oxygen Saturation (Exit)  95 %    Rating of Perceived Exertion (Exercise)  13    Perceived Dyspnea (Exercise)  2    Duration  Continue with 30 min of aerobic exercise without signs/symptoms of physical distress.    Intensity  THRR unchanged      Progression   Progression  Continue to progress workloads to maintain intensity without signs/symptoms of physical distress.      Resistance Training   Training Prescription  Yes    Weight  orange  bands    Reps  10-15    Time  10 Minutes      Oxygen   Oxygen  Continuous    Liters  2      NuStep   Level  3    SPM  80    Minutes  30    METs  1.7       Social History   Tobacco Use  Smoking Status Never Smoker  Smokeless Tobacco Never Used    Goals Met:  Exercise tolerated well No report of cardiac concerns or symptoms Strength training completed today  Goals Unmet:  Not Applicable  Comments: Service time is from 1300 to 1415    Dr. Rush Farmer is Medical Director for Pulmonary Rehab at [facility].

## 2019-05-07 ENCOUNTER — Encounter (HOSPITAL_COMMUNITY)
Admission: RE | Admit: 2019-05-07 | Discharge: 2019-05-07 | Disposition: A | Discharge status: Home / Self Care | Payer: Commercial Managed Care - PPO | Source: Ambulatory Visit | Attending: Internal Medicine | Admitting: Internal Medicine

## 2019-05-07 ENCOUNTER — Other Ambulatory Visit: Payer: Self-pay

## 2019-05-07 DIAGNOSIS — R0600 Dyspnea, unspecified: Secondary | ICD-10-CM | POA: Diagnosis not present

## 2019-05-07 DIAGNOSIS — I5042 Chronic combined systolic (congestive) and diastolic (congestive) heart failure: Secondary | ICD-10-CM

## 2019-05-11 ENCOUNTER — Other Ambulatory Visit: Payer: Self-pay

## 2019-05-11 ENCOUNTER — Encounter (HOSPITAL_COMMUNITY)
Admission: RE | Admit: 2019-05-11 | Discharge: 2019-05-11 | Disposition: A | Discharge status: Home / Self Care | Payer: Commercial Managed Care - PPO | Source: Ambulatory Visit | Attending: Internal Medicine | Admitting: Internal Medicine

## 2019-05-11 DIAGNOSIS — R0600 Dyspnea, unspecified: Secondary | ICD-10-CM | POA: Insufficient documentation

## 2019-05-11 NOTE — Progress Notes (Signed)
Nutrition Note: Virtual Visit  Week 1 Spoke with pt for virtual pulmonary rehab. Pt planning to start walking outside this week and look for exercise videos she can do inside her home.  Diet recall reviewed. Pt denies any problems with medications.  Nutrition goals reviewed. Barriers identified. Activity: Create a meal template to schedule dinners to help create a routine after the holidays.  Reinforced education and goals with summary email.  Will continue to monitor pt with weekly phone calls for virtual cardiac rehab.   [name], MS, RDN, LDN

## 2019-05-19 ENCOUNTER — Telehealth (HOSPITAL_COMMUNITY): Payer: Self-pay | Admitting: *Deleted

## 2019-05-19 ENCOUNTER — Inpatient Hospital Stay (HOSPITAL_COMMUNITY)
Admission: RE | Admit: 2019-05-19 | Discharge: 2019-05-19 | Disposition: A | Discharge status: Home / Self Care | Payer: Commercial Managed Care - PPO | Source: Ambulatory Visit

## 2019-05-19 ENCOUNTER — Other Ambulatory Visit: Payer: Self-pay

## 2019-05-19 NOTE — Progress Notes (Signed)
Nutrition Note: Virtual Visit  Week 2 Spoke with pt for virtual pulmonary rehab.  Diet recall reviewed. Pt denies any problems with medications.  Nutrition goals reviewed. Pt met goals from last week of meal planning. She has 4-5 meals planned for dinner this week. Discussed choosing easy meals next week to prevent fatigue. Goal to keep record of thought processes when eating to combat negative self talk. Reinforced education and goals with summary email.  Will continue to monitor pt with weekly phone calls for virtual cardiac rehab.   Michaele Offer, MS, RDN, LDN

## 2019-05-19 NOTE — Telephone Encounter (Signed)
Called patient via phone for virtual pulmonary rehab.  Meredith Diaz is having difficulty motivating herself to exercise at home.  She has ordered a stationary bicycle, but it has not arrived.  She much prefers exercising in person with [place], she is much more accountable in that setting.  She will try to walk with her husband at a specific time 2 times this week.  Given support.  PLAN :  Continue to monitor, support, and encourage Meredith Diaz to exercise at home.  Will follow up 05-26-2019 @ 10:30 am.

## 2019-05-20 ENCOUNTER — Telehealth (HOSPITAL_COMMUNITY): Payer: Self-pay

## 2019-05-20 NOTE — Telephone Encounter (Addendum)
Cardiac Rehab Note:  Unsuccessful telephone encounter to Meredith Diaz to follow up on logged "shortness of breath" in the virtual rehab app. Unfortunately was unable to leave HIPAA compliant VM message as patient's mailbox is full.  Plan: Will attempt follow up tomorrow  Jasin Brazel E. Vaughan Basta, BSN

## 2019-05-21 ENCOUNTER — Telehealth (HOSPITAL_COMMUNITY): Payer: Self-pay | Admitting: *Deleted

## 2019-05-21 NOTE — Telephone Encounter (Signed)
This is the second attempt to contact patient re: symptom of shortness of breath that was logged with her exercise in virtual pulmonary rehab.  No answer and her mailbox is full.  I sent her a chat message through the [employer] app saying that if her shortness of breath is more than normal please contact her physician.  Meredith Diaz normally experiences shortness of breath with any activity, especially exercise as was witnessed when she exercised in person in pulmonary rehab.

## 2019-05-26 ENCOUNTER — Telehealth (HOSPITAL_COMMUNITY): Payer: Self-pay | Admitting: *Deleted

## 2019-05-26 ENCOUNTER — Inpatient Hospital Stay (HOSPITAL_COMMUNITY)
Admission: RE | Admit: 2019-05-26 | Discharge: 2019-05-26 | Disposition: A | Discharge status: Home / Self Care | Payer: Commercial Managed Care - PPO | Source: Ambulatory Visit

## 2019-05-26 ENCOUNTER — Encounter (HOSPITAL_COMMUNITY)
Admission: RE | Admit: 2019-05-26 | Discharge: 2019-05-26 | Disposition: A | Discharge status: Home / Self Care | Payer: Commercial Managed Care - PPO | Source: Ambulatory Visit | Attending: Internal Medicine | Admitting: Internal Medicine

## 2019-05-26 ENCOUNTER — Other Ambulatory Visit: Payer: Self-pay

## 2019-05-26 NOTE — Telephone Encounter (Signed)
Called patient via telephone to see how she is doing with her home exercise via virtual pulmonary rehab.  Her husband drove her to the beach yesterday to improve her mental health.  She and her husband walked on the beach for 30 minutes and this was a good distraction for her.  She appreciates the weekly calls from [place] to help motivate her.

## 2019-05-26 NOTE — Progress Notes (Signed)
Nutrition Note: Virtual Visit  Spoke with pt for virtual pulmonary rehab. Pt with healthy diet. She is meal planning and being intentional with her food choices. Pt has large amount of guilt associated with eating. She does not over indulge, she eats until satisfied, she has excellent knowledge of a healthy diet. History of eating disorder, working on weight loss is inappropriate. Continued encouragement to seek support for hx of eating disorder. Will continue to f/u with Rennis Harding biweekly for support.  Diet recall reviewed. Pt denies any problems with medications.  Nutrition goals reviewed. Activity: continue meal planning  Will continue to monitor pt with biweekly phone calls for virtual pulmonary rehab.   [name], MS, RDN, LDN

## 2019-06-05 ENCOUNTER — Other Ambulatory Visit (HOSPITAL_COMMUNITY): Payer: Self-pay | Admitting: Cardiology

## 2019-06-05 DIAGNOSIS — I5042 Chronic combined systolic (congestive) and diastolic (congestive) heart failure: Secondary | ICD-10-CM

## 2019-06-09 ENCOUNTER — Other Ambulatory Visit: Payer: Self-pay

## 2019-06-09 ENCOUNTER — Other Ambulatory Visit: Payer: Self-pay | Admitting: Pulmonary Disease

## 2019-06-09 ENCOUNTER — Encounter (HOSPITAL_COMMUNITY)
Admission: RE | Admit: 2019-06-09 | Discharge: 2019-06-09 | Disposition: A | Discharge status: Home / Self Care | Payer: Commercial Managed Care - PPO | Source: Ambulatory Visit | Attending: Internal Medicine | Admitting: Internal Medicine

## 2019-06-09 DIAGNOSIS — R0600 Dyspnea, unspecified: Secondary | ICD-10-CM | POA: Insufficient documentation

## 2019-06-09 NOTE — Progress Notes (Signed)
Nutrition Note: Virtual Visit Spoke with pt for virtual pulmonary rehab.  Diet recall reviewed. Nutrition goals reviewed. Meredith Diaz continues to work on reducing stress associated with food. Reinforced education and goals with summary email.  Will continue to monitor pt with weekly phone calls for virtual cardiac rehab.   Meredith Campanile, MS, RDN, LDN

## 2019-06-11 ENCOUNTER — Telehealth (HOSPITAL_COMMUNITY): Payer: Self-pay | Admitting: *Deleted

## 2019-06-11 NOTE — Telephone Encounter (Signed)
Unable to leave a message, mailbox is full re: virtual pulmonary rehab.

## 2019-06-23 ENCOUNTER — Telehealth (HOSPITAL_COMMUNITY): Payer: Self-pay | Admitting: *Deleted

## 2019-06-23 ENCOUNTER — Telehealth: Payer: Self-pay | Admitting: Internal Medicine

## 2019-06-23 ENCOUNTER — Encounter (HOSPITAL_COMMUNITY)
Admission: RE | Admit: 2019-06-23 | Discharge: 2019-06-23 | Disposition: A | Discharge status: Home / Self Care | Payer: Commercial Managed Care - PPO | Source: Ambulatory Visit | Attending: Internal Medicine | Admitting: Internal Medicine

## 2019-06-23 ENCOUNTER — Other Ambulatory Visit: Payer: Self-pay

## 2019-06-23 ENCOUNTER — Inpatient Hospital Stay (HOSPITAL_COMMUNITY)
Admission: RE | Admit: 2019-06-23 | Discharge: 2019-06-23 | Disposition: A | Discharge status: Home / Self Care | Payer: Commercial Managed Care - PPO | Source: Ambulatory Visit

## 2019-06-23 DIAGNOSIS — J9692 Respiratory failure, unspecified with hypercapnia: Secondary | ICD-10-CM

## 2019-06-23 DIAGNOSIS — J984 Other disorders of lung: Secondary | ICD-10-CM

## 2019-06-23 NOTE — Telephone Encounter (Signed)
MR - please advise if it's okay to make this referral. Thanks!

## 2019-06-23 NOTE — Telephone Encounter (Signed)
Called to see if Dr. Marchelle Gearing would consider referring patient to maintenance pulmonary rehab.  She has completed the undergraduate program.

## 2019-06-23 NOTE — Progress Notes (Signed)
Nutrition Note: Virtual Visit Spoke with pt for virtual pulmonary rehab. She is trying to be more active by getting more steps each day and taking stairs at home more often.  Diet recall reviewed. Nutrition goals reviewed. Lindey continues to work on reducing stress associated with food. She continues to seek support via ongoing counseling as well as nutrition counseling with an eating disorder dietitian.  Will continue to monitor pt with weekly phone calls for virtual cardiac rehab.   [name], MS, RDN, LDN

## 2019-06-24 NOTE — Telephone Encounter (Signed)
Ok for maintenance pulmonary rehabilitation

## 2019-06-24 NOTE — Telephone Encounter (Signed)
Order has been placed per MR. 

## 2019-06-30 NOTE — Addendum Note (Signed)
Encounter addended by: Drema Pry, RN on: 06/30/2019 3:38 PM  Actions taken: Clinical Note Signed, Episode resolved

## 2019-06-30 NOTE — Progress Notes (Signed)
Discharge Progress Report  Patient Details  Name: Meredith Diaz MRN: [id number] Date of Birth: [birth year] Referring Provider:     Pulmonary Rehab Walk Test from 03/02/2019 in [facility]  Referring Provider  Dr. Chase Caller       Number of Visits: 14 Reason for Discharge:  Patient reached a stable level of exercise. Patient independent in their exercise. Patient has met program and personal goals.  Smoking History:  Social History   Tobacco Use  Smoking Status Never Smoker  Smokeless Tobacco Never Used    Diagnosis:  05/2018 Chronic combined systolic and diastolic CHF (congestive heart failure) ([place])  ADL [facility]: Pulmonary Assessment Scores    Row Name 03/02/19 1055 03/02/19 1101 05/07/19 1541     ADL [facility]   ADL Phase  Entry  Entry  Exit   SOB Score total  --  73  71     CAT Score   CAT Score  --  31  28     mMRC Score   mMRC Score  4  --  4      Initial Exercise Prescription: Initial Exercise Prescription - 03/02/19 1100      Date of Initial Exercise RX and Referring Provider   Date  03/02/19    Referring Provider  Dr. Chase Caller      Oxygen   Oxygen  Continuous    Liters  3      Recumbant Bike   Level  1    Minutes  15    METs  2      NuStep   Level  2    SPM  80    Minutes  15      Prescription Details   Frequency (times per week)  2    Duration  Progress to 30 minutes of continuous aerobic without signs/symptoms of physical distress      Intensity   THRR 40-80% of Max Heartrate  72-145    Ratings of Perceived Exertion  11-13    Perceived Dyspnea  0-4      Progression   Progression  Continue to progress workloads to maintain intensity without signs/symptoms of physical distress.      Resistance Training   Training Prescription  Yes    Weight  orange bands    Reps  10-15       Discharge Exercise Prescription (Final Exercise Prescription Changes): Exercise Prescription Changes - 05/05/19 1500       Response to Exercise   Blood Pressure (Admit)  110/80    Blood Pressure (Exercise)  108/70    Blood Pressure (Exit)  104/74    Heart Rate (Admit)  98 bpm    Heart Rate (Exercise)  93 bpm    Heart Rate (Exit)  95 bpm    Oxygen Saturation (Admit)  98 %    Oxygen Saturation (Exercise)  93 %    Oxygen Saturation (Exit)  95 %    Rating of Perceived Exertion (Exercise)  13    Perceived Dyspnea (Exercise)  2    Duration  Continue with 30 min of aerobic exercise without signs/symptoms of physical distress.    Intensity  THRR unchanged      Progression   Progression  Continue to progress workloads to maintain intensity without signs/symptoms of physical distress.      Resistance Training   Training Prescription  Yes    Weight  orange bands    Reps  10-15  Time  10 Minutes      Oxygen   Oxygen  Continuous    Liters  2      NuStep   Level  3    SPM  80    Minutes  30    METs  1.7       Functional Capacity: 6 Minute Walk    Row Name 03/02/19 1056 05/07/19 1542       6 Minute Walk   Phase  Initial  Discharge    Distance  861 feet  975 feet    Distance % Change  --  13.24 %    Distance Feet Change  --  114 ft    Walk Time  5.17 minutes  4.83 minutes    # of Rest Breaks  2 2 standing rest breaks. One was 35 sec and the other 15 sec  3 3 standing rest breaks of 40 sec, 20 sec, and 10 sec    MPH  1.63  1.85    METS  2.95  3.34    RPE  17  14    Perceived Dyspnea   3  3    VO2 Peak  10.32  11.69    Symptoms  Yes (comment)  Yes (comment)    Comments  desaturated on 2L to 83%, saturations maintained at 90% and above on 3L  --    Resting HR  76 bpm  86 bpm    Resting BP  104/68  100/60    Resting Oxygen Saturation   98 %  96 %    Exercise Oxygen Saturation  during 6 min walk  83 %  95 %    Max Ex. HR  115 bpm  124 bpm    Max Ex. BP  122/80  138/86    2 Minute Post BP  110/82  112/84      Interval HR   1 Minute HR  95  108    2 Minute HR  106  121    3 Minute HR  110   115    4 Minute HR  115  118    5 Minute HR  114  118    6 Minute HR  107  124    2 Minute Post HR  107  112    Interval Heart Rate?  Yes  Yes      Interval Oxygen   Interval Oxygen?  Yes  Yes    Baseline Oxygen Saturation %  98 %  96 %    1 Minute Oxygen Saturation %  83 %  95 %    1 Minute Liters of Oxygen  2 L  3 L    2 Minute Oxygen Saturation %  94 %  95 %    2 Minute Liters of Oxygen  3 L  3 L    3 Minute Oxygen Saturation %  90 %  97 %    3 Minute Liters of Oxygen  3 L  3 L    4 Minute Oxygen Saturation %  97 %  96 %    4 Minute Liters of Oxygen  3 L  3 L    5 Minute Oxygen Saturation %  95 %  95 %    5 Minute Liters of Oxygen  3 L  3 L    6 Minute Oxygen Saturation %  99 %  95 %    6 Minute Liters of Oxygen  3 L  3 L    2 Minute Post Oxygen Saturation %  95 %  97 %    2 Minute Post Liters of Oxygen  2 L  3 L       Psychological, QOL, Others - Outcomes: PHQ 2/9: Depression screen [facility] 2/9 03/02/2019 06/09/2018 04/16/2018 08/07/2017  Decreased Interest 0 '3 3 2  ' Down, Depressed, Hopeless '1 3 3 3  ' PHQ - 2 Score '1 6 6 5  ' Altered sleeping '1 2 2 2  ' Tired, decreased energy '2 3 3 3  ' Change in appetite '2 2 2 3  ' Feeling bad or failure about yourself  '2 3 3 3  ' Trouble concentrating 0 '2 2 2  ' Moving slowly or fidgety/restless '2 2 2 2  ' Suicidal thoughts 0 0 0 0  PHQ-9 Score - '20 20 20  ' Difficult doing work/chores Not difficult at all Very difficult - Very difficult  Some recent data might be hidden    Quality of Life:   Personal Goals: Goals established at orientation with interventions provided to work toward goal. Personal Goals and Risk Factors at Admission - 03/02/19 1009      Core Components/Risk Factors/Patient Goals on Admission   Heart Failure  Yes    Expected Outcomes  Improve functional capacity of life;Short term: Attendance in program 2-3 days a week with increased exercise capacity. Reported lower sodium intake. Reported increased fruit and vegetable intake.  Reports medication compliance.;Short term: Daily weights obtained and reported for increase. Utilizing diuretic protocols set by physician.;Long term: Adoption of self-care skills and reduction of barriers for early signs and symptoms recognition and intervention leading to self-care maintenance.    Stress  Yes    Intervention  Offer individual and/or small group education and counseling on adjustment to heart disease, stress management and health-related lifestyle change. Teach and support self-help strategies.;Refer participants experiencing significant psychosocial distress to appropriate mental health specialists for further evaluation and treatment. When possible, include family members and significant others in education/counseling sessions.        Personal Goals Discharge: Goals and Risk Factor Review    Row Name 03/02/19 1013 03/23/19 1210 04/20/19 1226         Core Components/Risk Factors/Patient Goals Review   Personal Goals Review  Increase knowledge of respiratory medications and ability to use respiratory devices properly.;Improve shortness of breath with ADL's;Develop more efficient breathing techniques such as purse lipped breathing and diaphragmatic breathing and practicing self-pacing with activity.;Heart Failure  Develop more efficient breathing techniques such as purse lipped breathing and diaphragmatic breathing and practicing self-pacing with activity.;Heart Failure;Increase knowledge of respiratory medications and ability to use respiratory devices properly.;Improve shortness of breath with ADL's  Develop more efficient breathing techniques such as purse lipped breathing and diaphragmatic breathing and practicing self-pacing with activity.;Increase knowledge of respiratory medications and ability to use respiratory devices properly.;Stress;Heart Failure;Improve shortness of breath with ADL's     Review  --  Chamika just started the program and has attended 4 exercise sessions, too  early to see progression towards goals.  Her heart failure is stable, she is exercising on the nustep at level 3, and the recumbent bike on level 1, she is slow to progress, but attendance is great.     Expected Outcomes  --  See admission goals.  See admission goals.        Exercise Goals and Review: Exercise Goals    Row Name 03/02/19 1100 03/24/19 0715 04/20/19 6378  Exercise Goals   Increase Physical Activity  Yes  Yes  Yes     Intervention  Provide advice, education, support and counseling about physical activity/exercise needs.;Develop an individualized exercise prescription for aerobic and resistive training based on initial evaluation findings, risk stratification, comorbidities and participant's personal goals.  Provide advice, education, support and counseling about physical activity/exercise needs.;Develop an individualized exercise prescription for aerobic and resistive training based on initial evaluation findings, risk stratification, comorbidities and participant's personal goals.  Provide advice, education, support and counseling about physical activity/exercise needs.;Develop an individualized exercise prescription for aerobic and resistive training based on initial evaluation findings, risk stratification, comorbidities and participant's personal goals.     Expected Outcomes  Short Term: Attend rehab on a regular basis to increase amount of physical activity.;Long Term: Add in home exercise to make exercise part of routine and to increase amount of physical activity.;Long Term: Exercising regularly at least 3-5 days a week.  Short Term: Attend rehab on a regular basis to increase amount of physical activity.;Long Term: Add in home exercise to make exercise part of routine and to increase amount of physical activity.;Long Term: Exercising regularly at least 3-5 days a week.  Short Term: Attend rehab on a regular basis to increase amount of physical activity.;Long Term: Add in  home exercise to make exercise part of routine and to increase amount of physical activity.;Long Term: Exercising regularly at least 3-5 days a week.     Increase Strength and Stamina  Yes  Yes  Yes     Intervention  Provide advice, education, support and counseling about physical activity/exercise needs.;Develop an individualized exercise prescription for aerobic and resistive training based on initial evaluation findings, risk stratification, comorbidities and participant's personal goals.  Provide advice, education, support and counseling about physical activity/exercise needs.;Develop an individualized exercise prescription for aerobic and resistive training based on initial evaluation findings, risk stratification, comorbidities and participant's personal goals.  Provide advice, education, support and counseling about physical activity/exercise needs.;Develop an individualized exercise prescription for aerobic and resistive training based on initial evaluation findings, risk stratification, comorbidities and participant's personal goals.     Expected Outcomes  Short Term: Increase workloads from initial exercise prescription for resistance, speed, and METs.  Short Term: Increase workloads from initial exercise prescription for resistance, speed, and METs.  Short Term: Increase workloads from initial exercise prescription for resistance, speed, and METs.     Able to understand and use rate of perceived exertion (RPE) scale  Yes  Yes  Yes     Intervention  Provide education and explanation on how to use RPE scale  Provide education and explanation on how to use RPE scale  Provide education and explanation on how to use RPE scale     Expected Outcomes  Short Term: Able to use RPE daily in rehab to express subjective intensity level;Long Term:  Able to use RPE to guide intensity level when exercising independently  Short Term: Able to use RPE daily in rehab to express subjective intensity level;Long Term:   Able to use RPE to guide intensity level when exercising independently  Short Term: Able to use RPE daily in rehab to express subjective intensity level;Long Term:  Able to use RPE to guide intensity level when exercising independently     Able to understand and use Dyspnea scale  Yes  Yes  Yes     Intervention  Provide education and explanation on how to use Dyspnea scale  Provide education and explanation on how  to use Dyspnea scale  Provide education and explanation on how to use Dyspnea scale     Expected Outcomes  Short Term: Able to use Dyspnea scale daily in rehab to express subjective sense of shortness of breath during exertion;Long Term: Able to use Dyspnea scale to guide intensity level when exercising independently  Short Term: Able to use Dyspnea scale daily in rehab to express subjective sense of shortness of breath during exertion;Long Term: Able to use Dyspnea scale to guide intensity level when exercising independently  Short Term: Able to use Dyspnea scale daily in rehab to express subjective sense of shortness of breath during exertion;Long Term: Able to use Dyspnea scale to guide intensity level when exercising independently     Knowledge and understanding of Target Heart Rate Range (THRR)  Yes  Yes  Yes     Intervention  Provide education and explanation of THRR including how the numbers were predicted and where they are located for reference  Provide education and explanation of THRR including how the numbers were predicted and where they are located for reference  Provide education and explanation of THRR including how the numbers were predicted and where they are located for reference     Expected Outcomes  Short Term: Able to state/look up THRR;Long Term: Able to use THRR to govern intensity when exercising independently;Short Term: Able to use daily as guideline for intensity in rehab  Short Term: Able to state/look up THRR;Long Term: Able to use THRR to govern intensity when  exercising independently;Short Term: Able to use daily as guideline for intensity in rehab  Short Term: Able to state/look up THRR;Long Term: Able to use THRR to govern intensity when exercising independently;Short Term: Able to use daily as guideline for intensity in rehab     Understanding of Exercise Prescription  Yes  Yes  Yes     Intervention  Provide education, explanation, and written materials on patient's individual exercise prescription  Provide education, explanation, and written materials on patient's individual exercise prescription  Provide education, explanation, and written materials on patient's individual exercise prescription     Expected Outcomes  Short Term: Able to explain program exercise prescription;Long Term: Able to explain home exercise prescription to exercise independently  Short Term: Able to explain program exercise prescription;Long Term: Able to explain home exercise prescription to exercise independently  Short Term: Able to explain program exercise prescription;Long Term: Able to explain home exercise prescription to exercise independently        Exercise Goals Re-Evaluation: Exercise Goals Re-Evaluation    Row Name 03/24/19 0715 04/20/19 0956           Exercise Goal Re-Evaluation   Exercise Goals Review  Increase Physical Activity;Increase Strength and Stamina;Able to understand and use rate of perceived exertion (RPE) scale;Able to understand and use Dyspnea scale;Knowledge and understanding of Target Heart Rate Range (THRR);Understanding of Exercise Prescription  Increase Physical Activity;Increase Strength and Stamina;Able to understand and use rate of perceived exertion (RPE) scale;Able to understand and use Dyspnea scale;Knowledge and understanding of Target Heart Rate Range (THRR);Understanding of Exercise Prescription      Comments  Pt has attended 4 exercise sessions. Pt was severely deconditioned upon entering the program. Pt has a very positive outlook  and is motivated to make progress. Pt currently exercises at 1.9 METs on the stepper. Will continue to monitor and progress as able.  Pt has attended 10 exercise sessions. Progress has been slow, but pt maintains a positive attitude and is  a pleasure to work with. Pt currently exercises at 1.9 METs on the stepper. Will continue to monitor and progress as able.      Expected Outcomes  Through exercise at rehab and at home, the patient will decrease shortness of breath with daily activities and feel confident in carrying out an exercise regime at home.  Through exercise at rehab and at home, the patient will decrease shortness of breath with daily activities and feel confident in carrying out an exercise regime at home.         Nutrition & Weight - Outcomes: Pre Biometrics - 03/02/19 1700      Pre Biometrics   Height  5' (1.524 m)    Weight  104.2 kg    BMI (Calculated)  44.86    Grip Strength  25 kg        Nutrition: Nutrition Therapy & Goals - 03/12/19 1445      Nutrition Therapy   Diet  Mediterranean/Low Sodium      Personal Nutrition Goals   Nutrition Goal  Pt to increase whole grains vs refined grains for better blood sugar control    Personal Goal #2  Pt to continue limiting sodium to 1500 mg/day    Comments  Pt with hx of eating disorder - do not recommend restrictive diets      Intervention Plan   Expected Outcomes  Short Term Goal: A plan has been developed with personal nutrition goals set during dietitian appointment.;Long Term Goal: Adherence to prescribed nutrition plan.       Nutrition Discharge: Nutrition Assessments - 06/11/19 1429      Rate Your Plate Scores   Post Score  62       Education Questionnaire Score: Knowledge Questionnaire Score - 05/07/19 1542      Knowledge Questionnaire Score   Post Score  17/18       Goals reviewed with patient; copy given to patient.

## 2019-07-10 ENCOUNTER — Encounter (HOSPITAL_COMMUNITY)
Admission: RE | Admit: 2019-07-10 | Discharge: 2019-07-10 | Disposition: A | Discharge status: Home / Self Care | Payer: Commercial Managed Care - PPO | Source: Ambulatory Visit | Attending: Internal Medicine | Admitting: Internal Medicine

## 2019-07-10 ENCOUNTER — Other Ambulatory Visit: Payer: Self-pay

## 2019-07-10 DIAGNOSIS — R0600 Dyspnea, unspecified: Secondary | ICD-10-CM | POA: Insufficient documentation

## 2019-07-10 NOTE — Progress Notes (Signed)
Virtual Pulmonary Rehab: Nutrition Note   Spoke with pt for virtualpulmonaryrehab. She reveals frustration with herself as she feels defeated with her exercise. She is trying to make steps towards being more active and has thought about coming back to pulmonary rehab maintenance program or joining a gym after she receives the COVID19 vaccine. She reports feeling shame for having to come into the hospital to workout and is not sure she is ready to that yet. She would like to remain in the virtual platform and continue ongoing nutrition counseling.  Diet recall reviewed. Nutrition goals reviewed. Corinda continues to work on reducing stress associated with food. She continues to seek support via ongoing counseling as well as nutrition counseling with an eating disorder dietitian.  Will continue to monitor pt with biweekly phone calls for virtual pulmonary rehab.   [name], MS, RDN, LDN

## 2019-07-21 ENCOUNTER — Encounter (HOSPITAL_COMMUNITY): Payer: Commercial Managed Care - PPO

## 2019-07-24 ENCOUNTER — Encounter (HOSPITAL_COMMUNITY)
Admission: RE | Admit: 2019-07-24 | Discharge: 2019-07-24 | Disposition: A | Discharge status: Home / Self Care | Payer: Commercial Managed Care - PPO | Source: Ambulatory Visit | Attending: Internal Medicine | Admitting: Internal Medicine

## 2019-07-24 ENCOUNTER — Other Ambulatory Visit: Payer: Self-pay

## 2019-07-24 NOTE — Progress Notes (Signed)
Virtual Pulmonary Rehab: Nutrition Note   Spoke with pt for virtualpulmonaryrehab. She reports getting the COVID19 vaccine. We discussed exercise plans. She still wants to plan to find a way to exercise at home or in a gym. Discussed coming back to pulmonary rehab for maintenance. She still has reservations due to stigma but states she will think about it. She agrees to trying to use the [employer] App. Discussed goals and created a plan. Diet recall reviewed. Nutrition goals reviewed. Meredith Diaz continues to work on reducing stress associated with food.She continues to seek support via ongoing counseling as well as nutrition counseling with an eating disorder dietitian. Will continue to monitor pt with biweekly phone calls for virtual pulmonary rehab.   [name], MS, RDN, LDN

## 2019-07-28 ENCOUNTER — Encounter (HOSPITAL_COMMUNITY): Payer: Self-pay | Admitting: Cardiology

## 2019-07-28 ENCOUNTER — Ambulatory Visit (HOSPITAL_COMMUNITY)
Admission: RE | Admit: 2019-07-28 | Discharge: 2019-07-28 | Disposition: A | Discharge status: Home / Self Care | Payer: Commercial Managed Care - PPO | Source: Ambulatory Visit | Attending: Cardiology | Admitting: Cardiology

## 2019-07-28 ENCOUNTER — Other Ambulatory Visit: Payer: Self-pay

## 2019-07-28 VITALS — BP 114/84 | HR 92 | Wt 233.0 lb

## 2019-07-28 DIAGNOSIS — I428 Other cardiomyopathies: Secondary | ICD-10-CM | POA: Insufficient documentation

## 2019-07-28 DIAGNOSIS — E662 Morbid (severe) obesity with alveolar hypoventilation: Secondary | ICD-10-CM | POA: Insufficient documentation

## 2019-07-28 DIAGNOSIS — J45909 Unspecified asthma, uncomplicated: Secondary | ICD-10-CM | POA: Diagnosis not present

## 2019-07-28 DIAGNOSIS — E039 Hypothyroidism, unspecified: Secondary | ICD-10-CM | POA: Diagnosis not present

## 2019-07-28 DIAGNOSIS — Z8249 Family history of ischemic heart disease and other diseases of the circulatory system: Secondary | ICD-10-CM | POA: Insufficient documentation

## 2019-07-28 DIAGNOSIS — Z888 Allergy status to other drugs, medicaments and biological substances status: Secondary | ICD-10-CM | POA: Diagnosis not present

## 2019-07-28 DIAGNOSIS — I5042 Chronic combined systolic (congestive) and diastolic (congestive) heart failure: Secondary | ICD-10-CM

## 2019-07-28 DIAGNOSIS — Z9981 Dependence on supplemental oxygen: Secondary | ICD-10-CM | POA: Diagnosis not present

## 2019-07-28 DIAGNOSIS — I5022 Chronic systolic (congestive) heart failure: Secondary | ICD-10-CM | POA: Diagnosis not present

## 2019-07-28 DIAGNOSIS — Z79890 Hormone replacement therapy (postmenopausal): Secondary | ICD-10-CM | POA: Diagnosis not present

## 2019-07-28 DIAGNOSIS — Z6841 Body Mass Index (BMI) 40.0 and over, adult: Secondary | ICD-10-CM | POA: Insufficient documentation

## 2019-07-28 DIAGNOSIS — Z7951 Long term (current) use of inhaled steroids: Secondary | ICD-10-CM | POA: Insufficient documentation

## 2019-07-28 DIAGNOSIS — Z79899 Other long term (current) drug therapy: Secondary | ICD-10-CM | POA: Insufficient documentation

## 2019-07-28 LAB — BASIC METABOLIC PANEL
Anion gap: 9 (ref 5–15)
BUN: 13 mg/dL (ref 6–20)
CO2: 34 mmol/L — ABNORMAL HIGH (ref 22–32)
Calcium: 8.6 mg/dL — ABNORMAL LOW (ref 8.9–10.3)
Chloride: 93 mmol/L — ABNORMAL LOW (ref 98–111)
Creatinine, Ser: 0.84 mg/dL (ref 0.44–1.00)
GFR calc Af Amer: >60 mL/min (ref >60)
GFR calc non Af Amer: >60 mL/min (ref >60)
Glucose, Bld: 163 mg/dL — ABNORMAL HIGH (ref 70–99)
Potassium: 3.7 mmol/L (ref 3.5–5.1)
Sodium: 136 mmol/L (ref 135–145)

## 2019-07-28 MED ORDER — TORSEMIDE 20 MG PO TABS: 80.0000 mg | ORAL_TABLET | Freq: Two times a day (BID) | ORAL | 3 refills | Status: DC

## 2019-07-28 MED ORDER — METOLAZONE 2.5 MG PO TABS: 2.5000 mg | ORAL_TABLET | ORAL | 0 refills | Status: DC

## 2019-07-28 MED ORDER — POTASSIUM CHLORIDE ER 20 MEQ PO TBCR: 20.0000 meq | EXTENDED_RELEASE_TABLET | Freq: Two times a day (BID) | ORAL | 2 refills | Status: DC

## 2019-07-28 NOTE — Patient Instructions (Signed)
INCREASE Torsemide 80mg  (4 tabs) twice a day   TAKE Metolazone 2.5mg  (1 tab) with morning torsemide on Wednesday and Friday.   TAKE an extra Potassium 20 meq (1 tab) on the days you take Metolazone (Wednesday and Friday).    Labs today and repeat in 1 week We will only contact you if something comes back abnormal or we need to make some changes. Otherwise no news is good news!   Please contact [facility] to discuss weight loss surgery. Their number is [phone]   Your physician recommends that you schedule a follow-up appointment in: 1 week for labs and 2 weeks with the NP/PA clinic.   GARAGE CODE:  MARCH: 6007 April: 5009   Please call office at [phone] option [phone] if you have any questions or concerns.    At the [facility], you and your health needs are our priority. As part of our continuing mission to provide you with exceptional heart care, we have created designated Provider Care Teams. These Care Teams include your primary Cardiologist (physician) and Advanced Practice Providers (APPs- Physician Assistants and Nurse Practitioners) who all work together to provide you with the care you need, when you need it.   You may see any of the following providers on your designated Care Team at your next follow up: [phone] Dr [name] . Dr Arvilla Meres . Marca Ancona, NP . Tonye Becket, PA . Robbie Lis, PharmD   Please be sure to bring in all your medications bottles to every appointment.

## 2019-07-28 NOTE — Progress Notes (Signed)
Date:  07/28/2019   ID:  Meredith Diaz, DOB 06-02-1980, MRN [id number]  Provider location: [place] Type of Visit: Established patient   PCP:  Hoyt Koch, MD  Cardiologist:  Dr. Aundra Dubin  Chief Complaint: Shortness of breath   History of Present Illness: Meredith Diaz is a 39 y.o. female who has a history of nonischemic cardiomyopathy.  This was diagnosed in 2013.  EF was initially 25-30%. Cardiac cath at that time showed no significant CAD.  Cardiac MRI showed no late gadoliniuim enhancement.  Since that time, EF has remained low but has improved a bit.  Most recent echo in 11/17 showed EF 45-50% with diffuse hypokinesis.  She also has asthma.  PFTs showed a severe restrictive defect that may be due to body habitus primarily.   She was admitted in 8/16 with chest pain.  CTA chest showed no PE, lung parenchyma looked normal.  She had ETT with poor exercise tolerance but no evidence for ischemia.  CPX was done in 12/16, showing moderate functional limitation due primarily to lung restriction.   Admitted 11/4 - 03/13/18 for increased DOE. Diuresed well with IV lasix and treated for asthma exacerbation. Treated with solumedrol and prednisone burst. Echo with EF 50-55%, mildly dilated/dysfunctional RV, septal bounce noted but IVC normal in size.   Due to increased dyspnea, RHC was done in 3/20.  Right and left heart filling pressures were elevated with near equalization of pressures suggesting restrictive cardiomyopathy.  No evidence of pericardial constriction.  I increased her torsemide to 40 mg bid.   She has seen pulmonary.  No evidence for interstitial lung disease.  Autoimmune workup was negative.  She completed pulmonary rehab.   She returns for followup of CHF.  She is using oxygen during the day and CPAP at night though she struggles to use CPAP. She feels like her dyspnea has gotten worse.  She is short of breath after walking about  100 feet or walking up stairs.  She has not been taking her pm dose of torsemide regularly. She continues to have a hard time with weight loss.   REDS clip 55%  ECG (personally reivewed): NSR, inferior TWIs  Labs (9/16): K 3.2, creatinine 1.11, hgb 14.3 Labs (12/16): K 3.3, creatinine 0.96, BNP 23 Labs (2/17): K 4.7, creatinine 0.95, TSH normal Labs (8/17): K 3.9, creatinine 0.93 Labs (12/18): K 3.6, creatinine 0.82, pro-BNP 42, TSH normal Labs (12/19): K 4.4, creatinine 0.9 Labs (2/20): BNP 47, hgb 13.1, K 4.4, creatinine 0.8 Labs (5/20): K 4.2, creatinine 1.01 Labs (9/20): K 3.8, creatinine 0.85, Rf negative, CCP negative, ANA negative, dsDNA negative, SCL-70 negative Labs (11/20): K 3.4, creatinine 0.96  PMH: 1. Chronic systolic CHF: Nonischemic cardiomyopathy.  - EF 25-30% by echo in 2013.  - Cardiac MRI (2013) with EF 52%, no delayed enhancement.  - LHC in 2013 without significant CAD. - 11/15 echo with EF 40-45%.   - 7/16 echo with EF 40-45%, grade II diastolic dysfunction.  - ETT (8/16) with poor exercise tolerance but no ischemia.  - CPX (12/16): peak VO2 15.7 (23.5 adjusted for ideal body weight), VE/VCO2 slope 21, RER 1.15 => moderate functional limitation thought to be primarily due to restrictive lung physiology.  - Echo (11/17): EF 45-50%, mild diffuse hypokinesis, normal RV size and systolic function.  - Echo (11/19): EF 50-55%, mildly dilated and mildly dysfunctional RV with respirophasic septal bounce noted, normal IVC and PA pressure estimate.  -  RHC (3/20): mean 19, PA 54/18 mean 38, mean PCWP 23, CI 2.77, PVR 2.79 WU. Equalization of pressures suggestive of restrictive cardiomyopathy, not suggestive of pericardial constriction.  2. Asthma: PFTs (4/16) with mild obstruction, severe restriction and severely decreased DLCO => think primarily due to body habitus.  3. Depression 4. Hypothyroidism 5. 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  6.  Angioedema with ACEI 7. OHS/OSA: CPAP at night, oxygen during the day.  8. Chronic leukocytosis: Negative workup.  9. BPPV 10. Obesity  11. Scoliosis: CT chest (5/20) showed no ILD, severe scoliosis.    Current Outpatient Medications  Medication Sig Dispense Refill  . albuterol (VENTOLIN HFA) 108 (90 Base) MCG/ACT inhaler INHALE 1-2 PUFFS INTO THE LUNGS EVERY 6 HOURS AS NEEDED FOR WHEEZING OR SHORTNESS OF BREATH. 18 g 3  . budesonide-formoterol (SYMBICORT) 160-4.5 MCG/ACT inhaler TAKE 2 PUFFS BY MOUTH TWICE A DAY 10.2 Inhaler 5  . clobetasol cream (TEMOVATE) 0.05 % Apply 1 application topically See admin instructions. Apply daily to affected areas of genital area as directed    . ipratropium-albuterol (DUONEB) 0.5-2.5 (3) MG/3ML SOLN Take 3 mLs by nebulization 2 (two) times daily as needed (For wheezing or shoortness of breath). (Patient taking differently: Take 3 mLs by nebulization 2 (two) times daily as needed (For wheezing or shortness of breath). ) 180 mL 5  . levothyroxine (SYNTHROID, LEVOTHROID) 125 MCG tablet Take 1 tablet (125 mcg total) by mouth daily. 90 tablet 2  . metoprolol succinate (TOPROL-XL) 25 MG 24 hr tablet TAKE 1 TABLET BY MOUTH EVERYDAY AT BEDTIME 90 tablet 3  . Potassium Chloride ER 20 MEQ TBCR Take 20 mEq by mouth 2 (two) times daily. Take an extra 20 meq on days you take Metolazone as directed by HF clinic 180 tablet 2  . spironolactone (ALDACTONE) 25 MG tablet TAKE 1 TABLET BY MOUTH EVERY DAY 30 tablet 11  . torsemide (DEMADEX) 20 MG tablet Take 4 tablets (80 mg total) by mouth 2 (two) times daily. 320 tablet 3  . valsartan (DIOVAN) 80 MG tablet Take 1 tablet (80 mg total) by mouth daily. 60 tablet 6  . metolazone (ZAROXOLYN) 2.5 MG tablet Take 1 tablet (2.5 mg total) by mouth as directed. ONLY take as directed by HF clinic 10 tablet 0   No current facility-administered medications for this encounter.    Allergies:   Lisinopril   Social History:  The patient   reports that she has never smoked. She has never used smokeless tobacco. She reports that she does not drink alcohol or use drugs.   Family History:  The patient's family history includes Alcoholism in her mother; Asthma in her paternal grandmother; Breast cancer (age of onset: 34) in her paternal grandmother; Breast cancer (age of onset: 39) in her maternal aunt and paternal aunt; Colon cancer (age of onset: 15) in her maternal grandmother; Depression in her mother; Heart attack in her maternal grandfather, maternal grandmother, paternal grandfather, and paternal grandmother; Heart disease in her father, maternal grandfather, and paternal grandmother; Hypertension in her father.   ROS:  Please see the history of present illness.   All other systems are personally reviewed and negative.   Exam:   BP 114/84   Pulse 92   Wt 105.7 kg (233 lb)   SpO2 97% Comment: 2L of O2  BMI 45.50 kg/m  General: NAD, obese Neck: Thick, JVP difficult but probably around 10, no thyromegaly or thyroid nodule.  Lungs: Distant breath sounds.  CV:  Nondisplaced PMI.  Heart regular S1/S2, no S3/S4, no murmur.  No peripheral edema.  No carotid bruit.  Normal pedal pulses.  Abdomen: Soft, nontender, no hepatosplenomegaly, no distention.  Skin: Intact without lesions or rashes.  Neurologic: Alert and oriented x 3.  Psych: Normal affect. Extremities: No clubbing or cyanosis.  HEENT: Normal.    Recent Labs: 07/28/2019: BUN 13; Creatinine, Ser 0.84; Potassium 3.7; Sodium 136  Personally reviewed   Wt Readings from Last 3 Encounters:  07/28/19 105.7 kg (233 lb)  05/05/19 104.3 kg (229 lb 15 oz)  04/28/19 103 kg (227 lb)     ASSESSMENT AND PLAN:  1. Chronic systolic CHF: Nonischemic cardiomyopathy.  No CAD on prior cath, prior cMRI showed no late gadolinium enhancement.  No family history of cardiomyopathy.  No ETOH/substance abuse.  No uncontrolled HTN.  Possible that she had viral myocarditis. Last echo in   03/11/18 showed LVEF up to 50-55% but with mildly dilated/dysfunctional RV, IVC appeared normal and PA pressure estimation was normal, septal bounce was noted.  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  No ventricular interdependence to suggest pericardial constriction. NYHA class III symptoms, somewhat worse recently. Difficult exam for volume but she does appear volume overloaded and REDS clip reading is high. I think that a lot of her symptomatology is also due to restriction from excess weight => OHS/OSA.  - Increase torsemide to 80 mg bid (needs to make sure to take the pm torsemide every day).   - I will have her take a dose of metolazone 2.5 mg on Wednesday morning and Friday morning this week.  She will take an extra KCl 20 mEq on the metolazone days.  BMET today and in 10 days.  - Continue Toprol XL 25 mg daily.  - Continue spironolactone 25 daily.   - Continue valsartan 80 mg daily.   2. Asthma: No wheezing on exam.  3. OHS/OSA: She uses CPAP at night and oxygen during the day.  OHS is likely due to lung restriction from obesity and scoliosis, CT chest in 5/20 showed no ILD but did show severe scoliosis.  I think OHS/OSA plays a large role in her dyspnea.  She has had a hard time using CPAP.  4. Obesity: Weight loss is imperative.  She tried the [facility] but did not find it helpful.  Still waiting to get an appointment with [facility] for bariatric surgery evaluation.   Followup in with NP/PA in 2 wks to reassess volume.   Signed, Marca Ancona, MD  07/28/2019  [facility] [address] [facility] [place] [address] [phone] (office) [phone] (fax)

## 2019-07-28 NOTE — Progress Notes (Signed)
ReDS Vest / Clip - 07/28/19 0900      ReDS Vest / Clip   Station Marker  B    Ruler Value  35    ReDS Value Range  (!) High volume overload    ReDS Actual Value  55    Anatomical Comments  sitting

## 2019-07-31 ENCOUNTER — Encounter (HOSPITAL_COMMUNITY): Payer: Self-pay

## 2019-08-04 ENCOUNTER — Other Ambulatory Visit: Payer: Self-pay

## 2019-08-04 ENCOUNTER — Ambulatory Visit (HOSPITAL_COMMUNITY)
Admission: RE | Admit: 2019-08-04 | Discharge: 2019-08-04 | Disposition: A | Discharge status: Home / Self Care | Payer: Commercial Managed Care - PPO | Source: Ambulatory Visit | Attending: Internal Medicine | Admitting: Internal Medicine

## 2019-08-04 DIAGNOSIS — I5042 Chronic combined systolic (congestive) and diastolic (congestive) heart failure: Secondary | ICD-10-CM | POA: Diagnosis present

## 2019-08-04 LAB — BASIC METABOLIC PANEL
Anion gap: 16 — ABNORMAL HIGH (ref 5–15)
BUN: 47 mg/dL — ABNORMAL HIGH (ref 6–20)
CO2: 35 mmol/L — ABNORMAL HIGH (ref 22–32)
Calcium: 9.1 mg/dL (ref 8.9–10.3)
Chloride: 84 mmol/L — ABNORMAL LOW (ref 98–111)
Creatinine, Ser: 1.75 mg/dL — ABNORMAL HIGH (ref 0.44–1.00)
GFR calc Af Amer: 42 mL/min — ABNORMAL LOW (ref >60)
GFR calc non Af Amer: 36 mL/min — ABNORMAL LOW (ref >60)
Glucose, Bld: 176 mg/dL — ABNORMAL HIGH (ref 70–99)
Potassium: 3.5 mmol/L (ref 3.5–5.1)
Sodium: 135 mmol/L (ref 135–145)

## 2019-08-05 ENCOUNTER — Telehealth (HOSPITAL_COMMUNITY): Payer: Self-pay

## 2019-08-05 DIAGNOSIS — I5042 Chronic combined systolic (congestive) and diastolic (congestive) heart failure: Secondary | ICD-10-CM

## 2019-08-05 MED ORDER — TORSEMIDE 20 MG PO TABS: ORAL_TABLET | ORAL | 3 refills | Status: DC

## 2019-08-05 NOTE — Telephone Encounter (Signed)
-----   Message from Laurey Morale, MD sent at 08/04/2019  9:08 PM EDT ----- She is not to take anymore metolazone.  Hold valsartan and torsemide for a day, then restart valsartan and restart torsemide at 80 qam/40 qpm.  BMET 1 week.

## 2019-08-11 ENCOUNTER — Other Ambulatory Visit: Payer: Self-pay

## 2019-08-11 ENCOUNTER — Ambulatory Visit (HOSPITAL_COMMUNITY)
Admission: RE | Admit: 2019-08-11 | Discharge: 2019-08-11 | Disposition: A | Discharge status: Home / Self Care | Payer: Commercial Managed Care - PPO | Source: Ambulatory Visit | Attending: Adult Health | Admitting: Adult Health

## 2019-08-11 VITALS — BP 118/86 | HR 98 | Wt 227.4 lb

## 2019-08-11 DIAGNOSIS — G4733 Obstructive sleep apnea (adult) (pediatric): Secondary | ICD-10-CM | POA: Insufficient documentation

## 2019-08-11 DIAGNOSIS — N179 Acute kidney failure, unspecified: Secondary | ICD-10-CM

## 2019-08-11 DIAGNOSIS — I428 Other cardiomyopathies: Secondary | ICD-10-CM | POA: Insufficient documentation

## 2019-08-11 DIAGNOSIS — M419 Scoliosis, unspecified: Secondary | ICD-10-CM | POA: Diagnosis not present

## 2019-08-11 DIAGNOSIS — Z6841 Body Mass Index (BMI) 40.0 and over, adult: Secondary | ICD-10-CM | POA: Diagnosis not present

## 2019-08-11 DIAGNOSIS — I5022 Chronic systolic (congestive) heart failure: Secondary | ICD-10-CM | POA: Insufficient documentation

## 2019-08-11 DIAGNOSIS — E669 Obesity, unspecified: Secondary | ICD-10-CM | POA: Insufficient documentation

## 2019-08-11 DIAGNOSIS — Z888 Allergy status to other drugs, medicaments and biological substances status: Secondary | ICD-10-CM | POA: Insufficient documentation

## 2019-08-11 DIAGNOSIS — I5042 Chronic combined systolic (congestive) and diastolic (congestive) heart failure: Secondary | ICD-10-CM | POA: Diagnosis not present

## 2019-08-11 DIAGNOSIS — R0602 Shortness of breath: Secondary | ICD-10-CM | POA: Diagnosis present

## 2019-08-11 DIAGNOSIS — R0600 Dyspnea, unspecified: Secondary | ICD-10-CM

## 2019-08-11 DIAGNOSIS — Z79899 Other long term (current) drug therapy: Secondary | ICD-10-CM | POA: Diagnosis not present

## 2019-08-11 DIAGNOSIS — E039 Hypothyroidism, unspecified: Secondary | ICD-10-CM | POA: Diagnosis not present

## 2019-08-11 DIAGNOSIS — J45901 Unspecified asthma with (acute) exacerbation: Secondary | ICD-10-CM | POA: Diagnosis not present

## 2019-08-11 DIAGNOSIS — Z7901 Long term (current) use of anticoagulants: Secondary | ICD-10-CM | POA: Insufficient documentation

## 2019-08-11 DIAGNOSIS — Z8249 Family history of ischemic heart disease and other diseases of the circulatory system: Secondary | ICD-10-CM | POA: Diagnosis not present

## 2019-08-11 DIAGNOSIS — R0609 Other forms of dyspnea: Secondary | ICD-10-CM

## 2019-08-11 LAB — BASIC METABOLIC PANEL
Anion gap: 10 (ref 5–15)
BUN: 16 mg/dL (ref 6–20)
CO2: 36 mmol/L — ABNORMAL HIGH (ref 22–32)
Calcium: 9.1 mg/dL (ref 8.9–10.3)
Chloride: 91 mmol/L — ABNORMAL LOW (ref 98–111)
Creatinine, Ser: 0.95 mg/dL (ref 0.44–1.00)
GFR calc Af Amer: >60 mL/min (ref >60)
GFR calc non Af Amer: >60 mL/min (ref >60)
Glucose, Bld: 193 mg/dL — ABNORMAL HIGH (ref 70–99)
Potassium: 3.4 mmol/L — ABNORMAL LOW (ref 3.5–5.1)
Sodium: 137 mmol/L (ref 135–145)

## 2019-08-11 NOTE — Addendum Note (Signed)
Encounter addended by: Burna Sis, LCSW on: 08/11/2019 4:36 PM  Actions taken: Flowsheet accepted, Clinical Note Signed

## 2019-08-11 NOTE — Addendum Note (Signed)
Encounter addended by: Theresia Bough, CMA on: 08/11/2019 4:11 PM  Actions taken: Charge Capture section accepted

## 2019-08-11 NOTE — Progress Notes (Signed)
Heart and Vascular Care Navigation  08/11/2019  [name] 1980/11/15 [id number]  Reason for Referral:  Assessment:  HRT/VAS Care Coordination    Patients Home Cardiology Office  [facility] Clinic   Outpatient Care Team  Social Worker   Social Worker Name:  Naaman Plummer- [facility]- [phone]   Living arrangements for the past 2 months  Single Family Home   Lives with:  Spouse; Pets   Patient Current Public relations account executive   Patient Has Concern With Paying Medical Bills  No   Does Patient Have Prescription Coverage?  Yes   Home Assistive Devices/Equipment  Oxygen      Social History: SDOH Screenings   Alcohol Screen: Low Risk   . Last Alcohol Screening Score (AUDIT): 2  Depression (PHQ2-9): Medium Risk  . PHQ-2 Score: 6  Financial Resource Strain: Medium Risk  . Difficulty of Paying Living Expenses: Somewhat hard  Food Insecurity: Food Insecurity Present  . Worried About Programme researcher, broadcasting/film/video in the Last Year: Sometimes true  . Ran Out of Food in the Last Year: Never true  Housing: Medium Risk  . Last Housing Risk Score: 1  Physical Activity: Inactive  . Days of Exercise per Week: 0 days  . Minutes of Exercise per Session: 0 min  Social Connections: Moderately Isolated  . Frequency of Communication with Friends and Family: Twice a week  . Frequency of Social Gatherings with Friends and Family: Never  . Attends Religious Services: Never  . Active Member of Clubs or Organizations: No  . Attends Banker Meetings: Never  . Marital Status: Married  Stress: Stress Concern Present  . Feeling of Stress : Very much  Tobacco Use: Low Risk   . Smoking Tobacco Use: Never Smoker  . Smokeless Tobacco Use: Never Used  Transportation Needs: No Transportation Needs  . Lack of Transportation (Medical): No  . Lack of Transportation (Non-Medical): No     Health Promotion Interventions:  Physical Inactivity Clinical  Exercise Physiologist for health coaching and discussion about safe home exercises and/or starting an exercise program.  Smoking Cessation Nota assessed  Dietary Concerns Working with Simple Nutrition worker on nutrition- has history of anorexia and still has very disordered thinking around food- reports that her work with nutritionist has been helpful  Health Coaching Referred to exercise physiologist   Other Care Navigation Interventions:   Inpatient/Outpatient Substance Abuse Counseling/Rehab Options n/a  Provided Pharmacy assistance resources  n/a  Patient expressed Mental Health concerns Yes, Referred to:  Current provider- has been seeing a counselor for years but does not have upcoming appt- encouraged pt to make new appt and CSW to check in with pt on Friday to ensure this was completed.   Barriers to Care: Pt current barrier to care seems to be mental health based.  Reports it is hard for her to do much of anything due to her depression.  Has current therapist but has only been seeing once a week.  CSW discussed need for her to start seeing more frequently.   Patient To Do: Patient to call PCP to set up appt and discuss depression medication- was on Trinellix but was too expensive- needs to discuss other options.  Pt also to call therapist to discuss weekly visits and transitioning to another therapist as hers is retiring in August. Care Navigation Team To Do: CSW to Check in with pt on Friday to see if she accomplished her goals. Exercise Physiologist to follow  up with pt regarding health coaching and exercise plan that will work for pt. Follow-up: planned for this Friday.  Jorge Ny, LCSW Clinical Social Worker [facility] Desk#: [phone] Cell#: [phone]

## 2019-08-11 NOTE — Patient Instructions (Signed)
It was great to see you today! No medication changes are needed at this time.  Labs today We will only contact you if something comes back abnormal or we need to make some changes. Otherwise no news is good news!  Your physician recommends that you schedule a follow-up appointment in: 3 months with Dr Bensimhon  Do the following things EVERYDAY: 1) Weigh yourself in the morning before breakfast. Write it down and keep it in a log. 2) Take your medicines as prescribed 3) Eat low salt foods--Limit salt (sodium) to 2000 mg per day.  4) Stay as active as you can everyday 5) Limit all fluids for the day to less than 2 liters  At the [facility], you and your health needs are our priority. As part of our continuing mission to provide you with exceptional heart care, we have created designated Provider Care Teams. These Care Teams include your primary Cardiologist (physician) and Advanced Practice Providers (APPs- Physician Assistants and Nurse Practitioners) who all work together to provide you with the care you need, when you need it.   You may see any of the following providers on your designated Care Team at your next follow up: . Dr Daniel Bensimhon . Dr Dalton McLean . Amy Clegg, NP . Brittainy Simmons, PA . Lauren Kemp, PharmD   Please be sure to bring in all your medications bottles to every appointment.     

## 2019-08-11 NOTE — Progress Notes (Signed)
Date:  08/11/2019   ID:  Meredith Diaz, DOB [birth year], MRN [id number]  Provider location: [facility] Type of Visit: Established patient   PCP:  Myrlene Broker, MD  Cardiologist:  Dr. Shirlee Latch  Chief Complaint: Shortness of breath   History of Present Illness: Meredith Diaz is a 39 y.o. female who has a history of nonischemic cardiomyopathy.  This was diagnosed in 2013.  EF was initially 25-30%. Cardiac cath at that time showed no significant CAD.  Cardiac MRI showed no late gadoliniuim enhancement.  Since that time, EF has remained low but has improved a bit.  Most recent echo in 11/17 showed EF 45-50% with diffuse hypokinesis.  She also has asthma.  PFTs showed a severe restrictive defect that may be due to body habitus primarily.   She was admitted in 8/16 with chest pain.  CTA chest showed no PE, lung parenchyma looked normal.  She had ETT with poor exercise tolerance but no evidence for ischemia.  CPX was done in 12/16, showing moderate functional limitation due primarily to lung restriction.   Admitted 11/4 - 03/13/18 for increased DOE. Diuresed well with IV lasix and treated for asthma exacerbation. Treated with solumedrol and prednisone burst. Echo with EF 50-55%, mildly dilated/dysfunctional RV, septal bounce noted but IVC normal in size.   Due to increased dyspnea, RHC was done in 3/20.  Right and left heart filling pressures were elevated with near equalization of pressures suggesting restrictive cardiomyopathy.  No evidence of pericardial constriction.  I increased her torsemide to 40 mg bid.   She has seen pulmonary.  No evidence for interstitial lung disease.  Autoimmune workup was negative.  She completed pulmonary rehab.   Today she returns for HF follow up. Last visit torsemide was increased to 80 mg twice a day and she was instructed to take metolazone for a few days. Overall feeling fair. Frustrated about her ongoing shortness  of breath with exertion and struggle to lose weight. Says she had anorexia years ago and now she is again focusing on the types of food she is eating.  SOB with exertion. Denies PND/Orthopnea. Appetite ok. No fever or chills. Weight at home has been 224-225 pounds. Taking all medications. She has been seen by dietitian and counselor. Not currently exercising. Completed pulmonary rehab in February.   REDS clip: 32%  Labs (9/16): K 3.2, creatinine 1.11, hgb 14.3 Labs (12/16): K 3.3, creatinine 0.96, BNP 23 Labs (2/17): K 4.7, creatinine 0.95, TSH normal Labs (8/17): K 3.9, creatinine 0.93 Labs (12/18): K 3.6, creatinine 0.82, pro-BNP 42, TSH normal Labs (12/19): K 4.4, creatinine 0.9 Labs (2/20): BNP 47, hgb 13.1, K 4.4, creatinine 0.8 Labs (5/20): K 4.2, creatinine 1.01 Labs (9/20): K 3.8, creatinine 0.85, Rf negative, CCP negative, ANA negative, dsDNA negative, SCL-70 negative Labs (11/20): K 3.4, creatinine 0.96  PMH: 1. Chronic systolic CHF: Nonischemic cardiomyopathy.  - EF 25-30% by echo in 2013.  - Cardiac MRI (2013) with EF 52%, no delayed enhancement.  - LHC in 2013 without significant CAD. - 11/15 echo with EF 40-45%.   - 7/16 echo with EF 40-45%, grade II diastolic dysfunction.  - ETT (7/86) with poor exercise tolerance but no ischemia.  - CPX (12/16): peak VO2 15.7 (23.5 adjusted for ideal body weight), VE/VCO2 slope 21, RER 1.15 => moderate functional limitation thought to be primarily due to restrictive lung physiology.  - Echo (11/17): EF 45-50%, mild diffuse hypokinesis, normal RV  size and systolic function.  - Echo (11/19): EF 50-55%, mildly dilated and mildly dysfunctional RV with respirophasic septal bounce noted, normal IVC and PA pressure estimate.  - RHC (3/20): mean 19, PA 54/18 mean 38, mean PCWP 23, CI 2.77, PVR 2.79 WU. Equalization of pressures suggestive of restrictive cardiomyopathy, not suggestive of pericardial constriction.  2. Asthma: PFTs (4/16) with mild  obstruction, severe restriction and severely decreased DLCO => think primarily due to body habitus.  3. Depression 4. Hypothyroidism 5. 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  6. Angioedema with ACEI 7. OHS/OSA: CPAP at night, oxygen during the day.  8. Chronic leukocytosis: Negative workup.  9. BPPV 10. Obesity  11. Scoliosis: CT chest (5/20) showed no ILD, severe scoliosis.    Current Outpatient Medications  Medication Sig Dispense Refill  . albuterol (VENTOLIN HFA) 108 (90 Base) MCG/ACT inhaler INHALE 1-2 PUFFS INTO THE LUNGS EVERY 6 HOURS AS NEEDED FOR WHEEZING OR SHORTNESS OF BREATH. 18 g 3  . budesonide-formoterol (SYMBICORT) 160-4.5 MCG/ACT inhaler TAKE 2 PUFFS BY MOUTH TWICE A DAY 10.2 Inhaler 5  . clobetasol cream (TEMOVATE) 0.05 % Apply 1 application topically See admin instructions. Apply daily to affected areas of genital area as directed    . ipratropium-albuterol (DUONEB) 0.5-2.5 (3) MG/3ML SOLN Take 3 mLs by nebulization 2 (two) times daily as needed (For wheezing or shoortness of breath). (Patient taking differently: Take 3 mLs by nebulization 2 (two) times daily as needed (For wheezing or shortness of breath). ) 180 mL 5  . levothyroxine (SYNTHROID, LEVOTHROID) 125 MCG tablet Take 1 tablet (125 mcg total) by mouth daily. 90 tablet 2  . metolazone (ZAROXOLYN) 2.5 MG tablet Take 1 tablet (2.5 mg total) by mouth as directed. ONLY take as directed by HF clinic 10 tablet 0  . metoprolol succinate (TOPROL-XL) 25 MG 24 hr tablet TAKE 1 TABLET BY MOUTH EVERYDAY AT BEDTIME 90 tablet 3  . Potassium Chloride ER 20 MEQ TBCR Take 20 mEq by mouth 2 (two) times daily. Take an extra 20 meq on days you take Metolazone as directed by HF clinic 180 tablet 2  . spironolactone (ALDACTONE) 25 MG tablet TAKE 1 TABLET BY MOUTH EVERY DAY 30 tablet 11  . torsemide (DEMADEX) 20 MG tablet Take 4 tablets (80 mg total) by mouth every morning AND 2 tablets (40 mg total) every evening. 320  tablet 3  . valsartan (DIOVAN) 80 MG tablet Take 1 tablet (80 mg total) by mouth daily. 60 tablet 6   No current facility-administered medications for this encounter.    Allergies:   Lisinopril   Social History:  The patient  reports that she has never smoked. She has never used smokeless tobacco. She reports that she does not drink alcohol or use drugs.   Family History:  The patient's family history includes Alcoholism in her mother; Asthma in her paternal grandmother; Breast cancer (age of onset: 76) in her paternal grandmother; Breast cancer (age of onset: 21) in her maternal aunt and paternal aunt; Colon cancer (age of onset: 21) in her maternal grandmother; Depression in her mother; Heart attack in her maternal grandfather, maternal grandmother, paternal grandfather, and paternal grandmother; Heart disease in her father, maternal grandfather, and paternal grandmother; Hypertension in her father.   ROS:  Please see the history of present illness.   All other systems are personally reviewed and negative.   Exam:   BP 118/86   Pulse 98   Wt 103.1 kg (227 lb 6.4  oz)   SpO2 97% Comment: on 3L  BMI 44.41 kg/m   Wt Readings from Last 3 Encounters:  08/11/19 103.1 kg (227 lb 6.4 oz)  07/28/19 105.7 kg (233 lb)  05/05/19 104.3 kg (229 lb 15 oz)    General:  Walked slowly in the clinic.  No resp difficulty HEENT: normal Neck: supple. no JVD. Carotids 2+ bilat; no bruits. No lymphadenopathy or thryomegaly appreciated. Cor: PMI nondisplaced. Regular rate & rhythm. No rubs, gallops or murmurs. Lungs: clear on 3 liters oxygen.  Abdomen: soft, nontender, nondistended. No hepatosplenomegaly. No bruits or masses. Good bowel sounds. Extremities: no cyanosis, clubbing, rash, edema Neuro: alert & orientedx3, cranial nerves grossly intact. moves all 4 extremities w/o difficulty. Affect pleasant   Recent Labs: 08/04/2019: BUN 47; Creatinine, Ser 1.75; Potassium 3.5; Sodium 135  Personally  reviewed   Wt Readings from Last 3 Encounters:  08/11/19 103.1 kg (227 lb 6.4 oz)  07/28/19 105.7 kg (233 lb)  05/05/19 104.3 kg (229 lb 15 oz)     ASSESSMENT AND PLAN:  1. Chronic systolic CHF: Nonischemic cardiomyopathy.  No CAD on prior cath, prior cMRI showed no late gadolinium enhancement.  No family history of cardiomyopathy.  No ETOH/substance abuse.  No uncontrolled HTN.  Possible that she had viral myocarditis. Last echo in  03/11/18 showed LVEF up to 50-55% but with mildly dilated/dysfunctional RV, IVC appeared normal and PA pressure estimation was normal, septal bounce was noted.  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  No ventricular interdependence to suggest pericardial constriction.  - Reds Clip 32%.  NYHA III. Volume status stable. Continue torsemide 80 mg twice a day. No metolazone for now.  - Continue Toprol XL 25 mg daily.  - Continue spironolactone 25 daily.   - Continue valsartan 80 mg daily.   - Allergic to lisinopril so no entresto.  - Check BMET today.  2. Asthma: No wheezing on exam.  3. OHS/OSA: She uses CPAP at night and oxygen during the day.  OHS is likely due to lung restriction from obesity and scoliosis, CT chest in 5/20 showed no ILD but did show severe scoliosis.  OHS/OSA plays a large role in her dyspnea.   - Not using CPAP. Discussed importance of nightly CPAP.   4. Obesity:  Body mass index is 44.41 kg/m.'Followed by dietitian.   Still waiting to get an appointment with [facility] for bariatric surgery evaluation.  5, AKI Creatinine bumped with diuresis. Check BMET today;   Follow up in 3 months with Dr Aundra Dubin. Referred to HF Exercise Physiologist and HFSW for exercise regimen and coping strategies.   Jeanmarie Hubert, NP  08/11/2019  [facility] [address] [facility] [place]  [phone] (office) [phone] (fax)

## 2019-08-12 ENCOUNTER — Encounter (HOSPITAL_COMMUNITY): Payer: Self-pay | Admitting: *Deleted

## 2019-08-12 NOTE — Progress Notes (Addendum)
Meredith Diaz was referred to me by Tonye Becket via CMA, [place] for Care Navigation surrounding patient's desire to increase physical activity Via Health Coaching. AHF/APP Appointment was 08/11/19   Starting weight: 227lbs (in clinic)   Today she patient expressed many levels of challenges physically and emotionally. However, she expressed desire to initiate lifestyle changes. Will elaboration and discussion, she has been working with a dietician/nutritionist (?), and therapist as well as completed pulmonary rehab. She has been able to identify root areas of her frustrations.   Goals:  She greatly desires to lose weight, feel confident in walking around stores, in her home and in society as a normal woman her age. She wants to improve her quality of life and confidence for her skills and physical abilities. She wants to be able to do all of this without shortness of breath, pain or fear. She hopes to improve her emotional state. Today she welcomed Health Coaching.   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 She was able to talk in good extent about the nature of her job and shed positive inspiration on the nature of it. She is married and her husband was the first person of mention when asked about family/friend support. She states he is a solid support for her and she wants to improve her life satisfaction for their marriage.   Challenges:  Mobility and ambulation, embarrassment surrounding the level of her physical abilities and her emotional "roller coasters". She feels she should be able to do more and therefore struggles with giving herself credit for even the small things and accomplishments. She says she wants to set small goals and then is ashamed and tearful because she "should" be able to do more. She has exercised in the past, has been through cardiac/pulmonary rehab 2x  and had success and satisfaction from the 1st round, the second round she went into maintenance program until she was not reaching her goals any longer and in fact actually began gaining weight, therefore quit maintenance rehab. Says she never "loved" or felt "happy" from exercise, she knew she was doing what was right for her body.   Accomplishments:  her job, her growth to discovering her triggers and root causes of trauma by therapy and through discussions with her dietician/nutritionist (?), her willingness to find the answer and ask her help to get there. She was challenged in finding positives and sharing accomplishments. She was able to complete cardiac rehab and follow a maintenance program for a year with habit changes, weight loss and moderate satisfaction. Although she said she never "loved" exercise, she knew she was doing right for her body and that kept her motivated and committed for some time.  Today she did not have great confidence in walking without stopping, we did this in the exercise lab before she left today.   We further explored ways and practices for her to find accomplishments big/small in her days, weeks and years thus far. She was back and forth on how she felt regarding acceptance of her health, physical state and where she "should" be. We discussed in length about the relativity of "should" and how to re-shift the focus to the current individual state and her goals, specific to herself and her own health. We brainstormed actions and plans to put into place in working to achieve her goals.   Health  Coaching plan:  - Focusing on overall health and wellness with weight loss through exercise guidance - Weekly Calls for a month or so, with in-person visits as needed.  o In person contact, exercise meetings, intermittent contact as needed - Monthly in-person check ins for evaluation  - Provided exercise recommendations and tips handouts for her resources based off of her  requests and small evaluation of her fitness level.   Action plan: - Walk 5 mins 3x a day each day for the next week.  - Write 5 things that make you proud/accomplishments/positives for the next week to discuss - Establish a weight loss goal.  - CEP/HWC Will follow-up with a call on Wednesday 08/19/19  Total time spent in-person with patient today: 63mins    Landis Martins, MS, ACSM, [facility] Clinical Exercise Physiologist/ Health and Wellness Coach

## 2019-08-14 ENCOUNTER — Telehealth (HOSPITAL_COMMUNITY): Payer: Self-pay | Admitting: Licensed Clinical Social Worker

## 2019-08-14 NOTE — Telephone Encounter (Signed)
CSW called pt to check in regarding emotional status at this time.  Pt reports doing better today- reports being very exhausted from her appt with [place] on Tuesday but glad that she is getting connected with services.  Pt has been able to set up appt with her therapist and plans to start seeing her more often and working with her on transition plan for when she retires.  Has not called her PCP to discuss medication at this time but plans to do that first thing next week- also considering seeing a psychiatrist to discuss medication as she has been through a lot of different regimens and might want to speak with a specialist to see what works.  Pt is agreeable to continued CSW follow up so will plan to reach out next week to check in  Burna Sis, LCSW Clinical Social Worker [facility] Desk#: [phone] Cell#: [phone]

## 2019-08-19 ENCOUNTER — Telehealth (HOSPITAL_COMMUNITY): Payer: Self-pay | Admitting: *Deleted

## 2019-08-19 NOTE — Telephone Encounter (Signed)
Todays home weight: 223lbs  Accomplishments/positives:  - She was much more uplifted today and said she was looking forward to our coaching call today - Enthusiastically elaborated on the extent of her achievement of goals set last week - Achieved 75% of her walking goal of 3x 5 min walks each day - Walked with her dog and stated she greatly enjoyed that time even though she had to stop and rest - Shared her 5 positive/gratitude log- stated this was very helpful for her, surprisingly. She felt silly at first but realized how much more self-aware and positive she was after - Visited with her parents for the first time in 1  years (due to covid). She felt supported and good about being able to keep up with her active parents. She was able to stand for 45 minutes without resting. This has been challenging for her in the past. - Achieved goals and completed exercise, she says because she knew I was holding her accountable and she wants to be responsible for her actions and hold her word.  Challenges/barriers: - Confidence in her ability to achieve goals and lose weight  - Her back pain, hip pain and overall body pain after walking and exerting (consistent with fibromyalgia and exercise)- we discussed the nature of fibro and exercise and what is to be expected. Explored how to manage and recognize. She was very gracious  - She is concerned for her lack of enthusiasm to exercise- however is still determined to pursue that change - Acknowledging that her parents are in "better shape" than her and trying to keep up with her parents feels "silly" to her.- however she found the flip positive side.   - Still overwhelmed and anxious at times.   Recommendations/plan: - Today she was more balanced with negative/positive talk and did not appear to be tearful. She said she is still overwhelmed and anxious. She is doing better by acknowledging this and understanding the relativity vs. "should". I educated her  from an exercise physiology specialty about fibromyalgia and exercise and how this should be managed with the expectations. Her overall goal is to lose weight and improve quality of life.  o Overall goal: lose 100 lbs (123bs) o 68-month goal: lose 15 lbs (5 lbs/month) o Re-assess weekly (with no more than 2 days between weighs)  Weekly plan:  - Walk 5 mins 3x a day each day for the next week.  - Write 5 things that make you proud/accomplishments/positives for the next week to discuss (She will email previous week to me) - Add 1 serving of vegetables/day for the next week - CEP/HWC Will follow-up with a call on Wednesday 08/26/19  Time Spent telephonically with patient: 35 mins   Lesia Hausen, MS, ACSM, [facility] Clinical Exercise Physiologist/ Health and Wellness Coach

## 2019-08-21 ENCOUNTER — Telehealth (HOSPITAL_COMMUNITY): Payer: Self-pay | Admitting: Licensed Clinical Social Worker

## 2019-08-21 NOTE — Telephone Encounter (Signed)
CSW attempted to call pt to check in- unable to reach and unable to leave a VM  CSW will continue to follow and attempt to reach at later time  Burna Sis, LCSW Clinical Social Worker [facility] Desk#: [phone] Cell#: [phone]

## 2019-08-24 ENCOUNTER — Telehealth (HOSPITAL_COMMUNITY): Payer: Self-pay | Admitting: Licensed Clinical Social Worker

## 2019-08-24 NOTE — Telephone Encounter (Signed)
CSW called pt to check in.  Pt reports she is feeling pretty low today but that she has PCP appt this week to discuss getting back on medications.  CSW and pt discussed coping mechanisms and encouraged pt to celebrate hitting small goals even though they seem insignificant to pt.  Pt acknowledges that she is very hard on herself and feels like she needs to be accomplishing bigger goals so ends up feeling badly about herself even when she does things that are hard for her.  Has been working with exercise physiologist who is helping pt to set activity goals and reports she has been completing those goals despite them being hard.  CSW encouraged pt to think of a way she can reward herself when she completes these goals to help her feel good about her accomplishment- pt will plan to watch a TV show today once she completes her fitness goal to reward herself.  CSW will continue to follow pt and check in- encouraged pt to reach out if she needs to speak to someone.  Burna Sis, LCSW Clinical Social Worker [facility] Desk#: [phone] Cell#: [phone]

## 2019-08-25 ENCOUNTER — Other Ambulatory Visit: Payer: Self-pay | Admitting: Internal Medicine

## 2019-08-25 ENCOUNTER — Other Ambulatory Visit: Payer: Self-pay

## 2019-08-25 ENCOUNTER — Ambulatory Visit (INDEPENDENT_AMBULATORY_CARE_PROVIDER_SITE_OTHER): Payer: Commercial Managed Care - PPO | Admitting: Internal Medicine

## 2019-08-25 ENCOUNTER — Encounter: Payer: Self-pay | Admitting: Internal Medicine

## 2019-08-25 VITALS — BP 118/74 | HR 92 | Temp 99.2°F | Ht 60.0 in | Wt 228.8 lb

## 2019-08-25 DIAGNOSIS — R0600 Dyspnea, unspecified: Secondary | ICD-10-CM | POA: Diagnosis not present

## 2019-08-25 DIAGNOSIS — E538 Deficiency of other specified B group vitamins: Secondary | ICD-10-CM | POA: Diagnosis not present

## 2019-08-25 DIAGNOSIS — E039 Hypothyroidism, unspecified: Secondary | ICD-10-CM | POA: Diagnosis not present

## 2019-08-25 DIAGNOSIS — F4323 Adjustment disorder with mixed anxiety and depressed mood: Secondary | ICD-10-CM

## 2019-08-25 DIAGNOSIS — R7301 Impaired fasting glucose: Secondary | ICD-10-CM

## 2019-08-25 DIAGNOSIS — D72829 Elevated white blood cell count, unspecified: Secondary | ICD-10-CM

## 2019-08-25 DIAGNOSIS — Z0001 Encounter for general adult medical examination with abnormal findings: Secondary | ICD-10-CM | POA: Diagnosis not present

## 2019-08-25 LAB — VITAMIN B12: Vitamin B-12: 245 pg/mL (ref 211–911)

## 2019-08-25 LAB — COMPREHENSIVE METABOLIC PANEL
ALT: 17 U/L (ref 0–35)
AST: 17 U/L (ref 0–37)
Albumin: 4.1 g/dL (ref 3.5–5.2)
Alkaline Phosphatase: 109 U/L (ref 39–117)
BUN: 16 mg/dL (ref 6–23)
CO2: 36 mEq/L — ABNORMAL HIGH (ref 19–32)
Calcium: 9.2 mg/dL (ref 8.4–10.5)
Chloride: 92 mEq/L — ABNORMAL LOW (ref 96–112)
Creatinine, Ser: 0.81 mg/dL (ref 0.40–1.20)
GFR: 78.5 mL/min (ref >60.00)
Glucose, Bld: 157 mg/dL — ABNORMAL HIGH (ref 70–99)
Potassium: 3.6 mEq/L (ref 3.5–5.1)
Sodium: 138 mEq/L (ref 135–145)
Total Bilirubin: 0.5 mg/dL (ref 0.2–1.2)
Total Protein: 8.1 g/dL (ref 6.0–8.3)

## 2019-08-25 LAB — LIPID PANEL
Cholesterol: 174 mg/dL (ref 0–200)
HDL: 44.8 mg/dL (ref >39.00)
LDL Cholesterol: 104 mg/dL — ABNORMAL HIGH (ref 0–99)
NonHDL: 128.77
Total CHOL/HDL Ratio: 4
Triglycerides: 125 mg/dL (ref 0.0–149.0)
VLDL: 25 mg/dL (ref 0.0–40.0)

## 2019-08-25 LAB — CBC
HCT: 39.1 % (ref 36.0–46.0)
Hemoglobin: 12.9 g/dL (ref 12.0–15.0)
MCHC: 32.9 g/dL (ref 30.0–36.0)
MCV: 90.4 fl (ref 78.0–100.0)
Platelets: 382 10*3/uL (ref 150.0–400.0)
RBC: 4.33 Mil/uL (ref 3.87–5.11)
RDW: 14.4 % (ref 11.5–15.5)
WBC: 12.9 10*3/uL — ABNORMAL HIGH (ref 4.0–10.5)

## 2019-08-25 LAB — HEMOGLOBIN A1C: Hgb A1c MFr Bld: 6.5 % (ref 4.6–6.5)

## 2019-08-25 LAB — TSH: TSH: 4.76 u[IU]/mL — ABNORMAL HIGH (ref 0.35–4.50)

## 2019-08-25 LAB — T4, FREE: Free T4: 1.1 ng/dL (ref 0.60–1.60)

## 2019-08-25 LAB — VITAMIN D 25 HYDROXY (VIT D DEFICIENCY, FRACTURES): VITD: 7.11 ng/mL — ABNORMAL LOW (ref 30.00–100.00)

## 2019-08-25 LAB — BRAIN NATRIURETIC PEPTIDE: Pro B Natriuretic peptide (BNP): 35 pg/mL (ref 0.0–100.0)

## 2019-08-25 MED ORDER — VITAMIN D (ERGOCALCIFEROL) 1.25 MG (50000 UNIT) PO CAPS: 50000.0000 [IU] | ORAL_CAPSULE | ORAL | 0 refills | Status: DC

## 2019-08-25 MED ORDER — BUPROPION HCL ER (XL) 150 MG PO TB24: 150.0000 mg | ORAL_TABLET | Freq: Every day | ORAL | 6 refills | Status: DC

## 2019-08-25 MED ORDER — LEVOTHYROXINE SODIUM 75 MCG PO TABS: 75.0000 ug | ORAL_TABLET | Freq: Every day | ORAL | 3 refills | Status: DC

## 2019-08-25 NOTE — Assessment & Plan Note (Signed)
Checking HgA1c. 

## 2019-08-25 NOTE — Assessment & Plan Note (Signed)
Needs TSH and free T4 as she is not taking thyroid medication at this time.

## 2019-08-25 NOTE — Assessment & Plan Note (Signed)
Checking BNP and CMP today and thyroid and vitamin levels which can all contribute to the worsening of this.

## 2019-08-25 NOTE — Assessment & Plan Note (Signed)
Needs B12 checked as not taking supplement right now.

## 2019-08-25 NOTE — Patient Instructions (Addendum)
We will check the labs today and get back in touch with you about the results.   We have sent in the wellbutrin to start taking 1 pill daily for the mood and potentially for the weight.

## 2019-08-25 NOTE — Assessment & Plan Note (Signed)
Rx wellbutrin. Trintellix did very well but too expensive for long term usage. She has taken cymbalta in the past without benefit but no side effects.

## 2019-08-25 NOTE — Assessment & Plan Note (Signed)
Weight up with pandemic and likely multifactorial as she is not taking thyroid medication at this time. Needs full workup and treatment to maximize medical conditions.

## 2019-08-25 NOTE — Progress Notes (Signed)
   Subjective:   Patient ID: Meredith Diaz, female    DOB: 06/12/1980, 39 y.o.   MRN: [id number]  HPI The patient is a 39 YO female coming in for physical. Did virtual cardiac rehab Feb/March. Having a lot of worsening health issues since the pandemic.   PMH, [facility], social history reviewed and updated  Review of Systems  Constitutional: Positive for activity change, fatigue and unexpected weight change. Negative for appetite change, chills and diaphoresis.  HENT: Negative.   Eyes: Negative.   Respiratory: Positive for shortness of breath. Negative for cough and chest tightness.   Cardiovascular: Negative for chest pain, palpitations and leg swelling.  Gastrointestinal: Negative for abdominal distention, abdominal pain, constipation, diarrhea, nausea and vomiting.  Musculoskeletal: Positive for arthralgias and gait problem.  Skin: Negative.   Neurological: Negative for light-headedness and headaches.  Psychiatric/Behavioral: Positive for decreased concentration and dysphoric mood. The patient is nervous/anxious.     Objective:  Physical Exam Constitutional:      Appearance: She is well-developed. She is obese.  HENT:     Head: Normocephalic and atraumatic.  Cardiovascular:     Rate and Rhythm: Normal rate and regular rhythm.  Pulmonary:     Effort: Pulmonary effort is normal. No respiratory distress.     Breath sounds: Normal breath sounds. No wheezing or rales.  Abdominal:     General: Bowel sounds are normal. There is no distension.     Palpations: Abdomen is soft.     Tenderness: There is no abdominal tenderness. There is no rebound.  Musculoskeletal:     Cervical back: Normal range of motion.  Skin:    General: Skin is warm and dry.  Neurological:     Mental Status: She is alert and oriented to person, place, and time.     Coordination: Coordination normal.  Psychiatric:     Comments: Mood appropriately sad given circumstances     Vitals:   08/25/19 0916  BP:  118/74  Pulse: 92  Temp: 99.2 F (37.3 C)  SpO2: 94%  Weight: 228 lb 12.8 oz (103.8 kg)  Height: 5' (1.524 m)    This visit occurred during the SARS-CoV-2 public health emergency.  Safety protocols were in place, including screening questions prior to the visit, additional usage of staff PPE, and extensive cleaning of exam room while observing appropriate contact time as indicated for disinfecting solutions.   Assessment & Plan:

## 2019-08-25 NOTE — Assessment & Plan Note (Signed)
Flu shot due next season. Covid-19 complete. Tetanus up to date. Pap smear up to date. Counseled about sun safety and mole surveillance. Counseled about the dangers of distracted driving. Given 10 year screening recommendations.

## 2019-08-26 ENCOUNTER — Telehealth (HOSPITAL_COMMUNITY): Payer: Self-pay | Admitting: *Deleted

## 2019-08-26 NOTE — Telephone Encounter (Addendum)
Goal review: Todays weight at home: 223lbs  - Walking 3x day o achieved about 75% of this goal - 5 gratitude notes over the next week-  o She increased her fruit intake o Made a primary care appointment, completed it and is hopeful about that attention o Tried to keep walking- even when she didn't love it.  o She feels motivated to continue this plan o She is proud that her weight has been consistent instead of gaining - Add 1 serving of vegetables/day  o She did not set up for success as she did not plan what vegetables and a grocery trip o In substitute she had fruit and ate those in place of the added vegetables  Accomplishments: - Although she has been low in the last week she is giving herself grace  - She is aware of her negativity and is trying to be more mindful in shifting those thoughts - She enjoys self-care time (specifically-facials, massages, reading, tv-time, pedicures) - She became aware that she was not quite prepared and was not set up for success to achieve her goal to increase vegetables- she realized this makes her more prepared for next time. In the mean time she substituted with fruits she had on hand, she was proud of this!  Challenges/Frustrations/Limitations: - Associates rewards with her health struggles when she was younger as he parents always used "treats" as a reward and she is struggling to avoid this as well as finding new ways to reward herself for small accomplishments - She feels rewards like facials, massages, self-care, tv-time, etc. are things that she can do any day and are not as specific to a reward for achieving a goal - Struggling to find ways to reward herself that do not involve food   Next week plan/Recommendations:   Walk 5 mins 3x a day each day for the next week. (she will reward herself daily with tv-time)  Write 5 things that make you proud/accomplishments/positives for the next week to discuss (She will email previous week to  me)  Add 1 serving of vegetables/day for the next week--she has a plan to write grocery list and get there tomorrow.  She will reward herself with dinner out with her husband when she achieves her goals. All while being mindful of food selection while she is nurturing her supportive relationships. - [facility] Will follow-up with a call on Wednesday 09/02/19   Time Spent with patient telephonically: 30 minutes    Lesia Hausen, MS, ACSM, [facility] Clinical Exercise Physiologist/ Health and Wellness Coach

## 2019-09-02 ENCOUNTER — Telehealth (HOSPITAL_COMMUNITY): Payer: Self-pay | Admitting: *Deleted

## 2019-09-02 NOTE — Telephone Encounter (Signed)
Goal review:  Lowest weight in the last week: 223lbs  Has started new medication from PCP--seeing mild benefit  Accomplishments: - She rewarded herself with watching tv at night, but it felt normal for her.  - Walked at least 2 walks each day, only missed 1 day without completing all 3 (~90-95% goal achieved).  o She sets a timer for 5 mins and adds stairs to add variety. - She meals plans before the grocery store - She did well with her vegetable goal (100%) and felt enthusiastic about that- she was proud telling me about her veggie and meals she prepped. She is eating more berries and blueberries. - Sunday--felt more inspired than she has in her more recent depression- she was productive and met her 3 walks goal. She cooked and meal prepped and had more energy. She said this was like a Saturday she would have experienced prior to her disabilities. She reflected on the person she felt when she once had more will-power   Challenges/Frustrations: - Evening walks are the toughest consistently- she is the most tired - Challenged with actually starting dinner before 7- they are tired after work and sit on couch then eat late or not at all. - She needs coping mechanism for seeing her number weight on the scale- she is unable to skip too many days of weighing due to potential volume/fluid overload. - She feels better than when we first met for coaching, however still struggles with "should" vs. her own relativity. - She forgot to document her 5 grateful things  New goals: - Walking 108mns 3x a day with the evening walk happening just after finishing work and before preparing dinner. She will reward herself with tv time at night. - Continue adding one vegetable a day the next week - Write down 5 positive things to report next week - Rest for 5-10 minutes after her evening walk before dinner - Start dinner by 6-6:30, just after resting from 556m walk and have it cooked, served and cleaned by 8pm so  she can have time to unwind for the evening.   [facility] health coaching with weekly telephonic following is beneficial for her and would like to continue.   Next Session: Wednesday 5/5 telephonically   (will ask about reward with dinner out with husband)  Time spent telephonically with patient: 45 mins     KrLandis MartinsMS, ACSM, [facility] Clinical Exercise Physiologist/ Health and [place]

## 2019-09-09 ENCOUNTER — Telehealth (HOSPITAL_COMMUNITY): Payer: Self-pay | Admitting: *Deleted

## 2019-09-09 NOTE — Telephone Encounter (Signed)
Goal Review:  - She did not weight at all in the last week (fear of the number on the scale) - Currently has a therapist that helps with her coping with previous eating disorders  Accomplishments: - Walked 10-15 mins at once on the weekend to get the walk done  o She felt like this was a more meaningful chunk of her time - Met 2 days of starting dinner by 6-6:30 - She is thinking of purchasing a recumbent bike (she is more comfortable with them and it feels more like exercise for her than walking around her living room). - She is talking more about her own initiation to purchase exercise equipment (She is looking to purchase in the next few weeks).  - Her report of 5 things during the last week -live on call  - She added vegetables each day and this is becoming second nature for her - achieved 90% of her walking goal - she is confident enough long term for her exercise equip purchase - she enjoyed looking back at her completed walks after dinner and her day was completed.  - She enjoyed her dinner with her husband as a reward and hopes to do this again soon/more often  Challenges/Frustrations: - Busier week than normal o the last week was really hard for her- she was down and frustrated, particularly with the scale being up, so she did not weigh at all in the last week.  o Forgot to write down her 5 things  - Weight on scale makes her think too obsessively about what she is eating for the whole day - Older daily habits/routines she is still holding - (i.e. eating later and needing to rest after work) o She wants to move her whole day up- earlier lunch, earlier dinner, earlier bedtime - Needing to rest after work (debrief)--struggling once she sits on the couch and watches tv. Needs to find a way to debrief without watching tv for too long before starting dinner - She began talking about whether or not coaching is helping her. She expressed feelings of failure, all while listing what she has  done during the coaching sessions.  Next week's goal:  - Walk 1x 85mns walk (morning or lunch) - Walk 2 other 5 mins walks (evening walk will be before dinner) - Add extra vegetables daily - Work on daily rhythms -  dinner by 6:30  (avoid cooking later than 7)   Meredith Diaz expressed whether or not health coaching is helpful for her and if it is "wasting time". We revisited the purpose, nature and style of coaching from our original agreement and she decided to give coaching another week since her last week was so hectic.  Next Session: Wednesday 5/12 (telephonically)  Time spent with patient telephonically today: 50 mins   KLandis Martins MS, ACSM, [facility] Clinical Exercise Physiologist/ Health and Wellness Coach

## 2019-09-16 ENCOUNTER — Telehealth (HOSPITAL_COMMUNITY): Payer: Self-pay | Admitting: *Deleted

## 2019-09-16 NOTE — Telephone Encounter (Signed)
-  Goal review o She did not weight again for 2 weeks now  Accomplishments:  - She met 5/7 days with at least 3x 5 min walks - She met 3 days of her 10 min midday walks (feels positive about this) o They did not cause any more pain than the others and it fit her schedule well  - She is consuming 2-3 servings each of vegetables and fruit each day- she says this is going well for her - Had dinner cooking before or by 7pm 3 nights in the last week (with her husband's help). This allowed for her to be finished with her day by 8pm (a goal of hers).  - Her husband is a solid support for her - Talked about how she would like to reward herself with a bath and reading but she doesn't currently have a bath tub. She then lead to how she used to like crochet.   Challenges/frustrations:  - Continues to feel she doesn't deserve a reward o TV doesn't feel like a reward o She doesn't know what to reward herself with- she still struggles with guilt in the things she finds pleasure in - Evenings feel crowded for her- with all things to be done after work after a stressful day  - Doubting coaching as accountability for her to set up for long term habit change. She feels she is only doing this now because she expects my call and review of her goals o Wants to discuss her goals and thoughts more closely with her husband so that she can rely on him for accountability for long term.  Action plans for week: (given she is overwhelmed, she wants to continue the same action plan)  Walk 1x 78mns walk (morning or lunch)  Walk 2 other 5 mins walks (evening walk will be before dinner)  Add extra vegetables daily  Work on daily rhythms -  dinner by 6:30  (avoid cooking later than 7) ** Add crocheting somewhere in the next week for a reward of completing 3 walks in a day. She can reward after the day of completion- as he evenings are tight.  ** She plans to email me her weight in the next 2 days since she has not  weighed in 2 weeks.    Time spent telephonically with patient: 40 mins   Next coaching Session: Wednesday 5/19   KLandis Martins MS, ACSM, [facility] Clinical Exercise Physiologist/ Health and Wellness Coach

## 2019-09-17 ENCOUNTER — Other Ambulatory Visit: Payer: Self-pay | Admitting: Internal Medicine

## 2019-09-18 ENCOUNTER — Telehealth (HOSPITAL_COMMUNITY): Payer: Self-pay | Admitting: Licensed Clinical Social Worker

## 2019-09-18 NOTE — Telephone Encounter (Signed)
CSW called pt to check in regarding depression/anxiety.  Unable to reach and unable to leave VM.  Will continue to reach out and assist as needed  Burna Sis, LCSW Clinical Social Worker [facility] Desk#: [phone] Cell#: [phone]

## 2019-09-23 ENCOUNTER — Telehealth (HOSPITAL_COMMUNITY): Payer: Self-pay | Admitting: *Deleted

## 2019-09-23 NOTE — Telephone Encounter (Signed)
Goal Review: - Emailed her weight last Friday 5/14 (up 2.5 lbs) - 224lbs today - She met her goals about 75% (self-reported) - Says her 76mnute walks are more difficult and she thinks its more associated with her motivation to do it (she feels like she is checking the clock every 30 seconds for that 10 minute span)  o Plans things during the walk to keep her busy to help the time pass the time  o She is still uncomfortable and the pain hasn't changed from day 1. o States she doesn't ever expect to feel better- that has been her reality for her whole  - She is motivated by thinking of her consequences - She made herself standing throughout cooking dinner rather than resting in between and she felt proud of that- she physically felt drained but accomplished  - [employer] she enjoys going there for her fruits and vegetables, which she sees as a treat and helping to set herself up for success for her action goals - 50% achieved cooking dinner by 7 and being cleaned up and finished by 8 o Her husband is a strong support in this  o They meal plan and have options for the week and this is helpful  o She says cleaning up after dinner is getting easier and smoother (less burdensome) - She is getting back into crocheting but needs more tutorials to get back into it o She is enjoying this and was able to talk much about this   Challenges: - Closing her work at home vs. being in the office.  - Her commute helped to transition from one job task to the next  o We discussed options and she identified her need to establish a new transition - She is challenged by routines   She stated that he is showing up and holding herself accountable and this she is proud of.  Per previous discussions, she discussed the pros and cons of terminating coaching- using her style for motivation, but she still wants to continue for accountability. We agreed to follow in 2 weeks since she has had 6 weeks of weekly  follow-up.   Action plans for week: (given she is hasn't 100% met her goals-she wants the same plan)  Walk 1x 131ms walk (morning or lunch)  Walk 2 other 5 mins walks (evening walk will be before dinner)  Add extra vegetables daily  Work on daily rhythms -  dinner by 6:30  (avoid cooking later than 7)  Evening transitioning: sit outside and listen to music for 5-10 mins after work and before starting dinner.)  ** Add crocheting somewhere in the next week for a reward of completing 3 walks in a day. She can reward after the day of completion- as he evenings are tight- exploring tutorial videos   Next Session: Wednesday June 2nd  Time spent telephonically with patient: 35 mins   KrLandis MartinsMS, ACSM, [facility] Clinical Exercise Physiologist/ Health and Wellness Coach

## 2019-10-07 ENCOUNTER — Encounter (HOSPITAL_COMMUNITY): Payer: Self-pay | Admitting: *Deleted

## 2019-10-07 ENCOUNTER — Telehealth (HOSPITAL_COMMUNITY): Payer: Self-pay | Admitting: *Deleted

## 2019-10-07 ENCOUNTER — Telehealth (HOSPITAL_COMMUNITY): Payer: Self-pay | Admitting: Cardiology

## 2019-10-07 DIAGNOSIS — I5042 Chronic combined systolic (congestive) and diastolic (congestive) heart failure: Secondary | ICD-10-CM

## 2019-10-07 NOTE — Progress Notes (Signed)
Erroneous

## 2019-10-07 NOTE — Telephone Encounter (Signed)
Goal Review: - 224 lbs today.  - Meeting about 80% of her goals in the last 2 weeks - Has been more mindful about the vegetable and walking goals- surrounding her goals and rewards - Feels proud that she was aware that she made the decision to not walk (trying to see the positive in this)  o Sees this as she decided to use the time for something else versus calling herself "lazy" - Found herself trying to walk another 10 minute walk to combine the other two walks to "get it over with". Although she doesn't feel like she can do it at all and still feels bad and hurts when she does this. She doesn't believe she can do it although she has already completed the walk.  - Does well with vegetables o Has cleaned up her diet since she was diagnosed with CHF - Not doing as well as she would like with starting dinner before 7pm.  - She liked the transitioning after work sitting outside and listening to music for 5-10 mins.  o Set an alarm o But Still found this challenging because it was still work o Once she sat down she did not want to get up - She is not enjoying exercise as a source for weight loss and therefore wonders if she should maybe look more into her diet.   Meredith Diaz has been contemplating terminating health coaching for a few weeks because she hasn't seen any changes and doesn't feel any better, although she described what she was doing since coaching and what she wouldn't be doing without coaching. She brought this up again today and struggled because of her sense of "failure". We discussed in depth about her defining failures and she proceeded to request discharge.   We discussed options for her to attend the PREP program for a different approach to get her out of the house and more intentional with her goals. This is likely a better source for her at this time because she does not like having to report what she HAS NOT completed. This program will allow her to disconnect from being at home  so often, exercise with education, accountability, and social support in the community (including those with chronic clinical conditions).   As a team we agreed to terminate her health coaching and discharge her from the 75-month program prematurely according to her request. We reviewed her accomplishments, long term goals and how she achieved to this point. I left the door open for contact and communication should she need this in the future.   Time spent telephonically with patient: 30 mins    Meredith Hausen, MS, ACSM, [facility] Clinical Exercise Physiologist/ Health and Wellness Coach

## 2019-10-07 NOTE — Telephone Encounter (Signed)
Coaching d/c'd and patient would like to begin PREP exercise program Order placed

## 2019-10-08 ENCOUNTER — Telehealth: Payer: Self-pay

## 2019-10-08 NOTE — Telephone Encounter (Signed)
Call placed to patient reference referral to PREP.  Explained program and start date for 6/29 230p-345p. Tues/Thurs. Tuesdays may be a challenge given work schedule but she will check calendar.  Concerned about how much she will be able to do. Explained philosophy of starting where you are with what you have. We can find exercises she will be able to do and modify as we go. Wears O2.  Initial goals are weight loss of 50 lbs and to get stronger. Will work with her to get clearer on goals so she has a clear WHY of participating in program.  Will call back for intake appt.

## 2019-10-20 ENCOUNTER — Telehealth: Payer: Self-pay

## 2019-10-20 NOTE — Telephone Encounter (Signed)
[facility] requesting call back to schedule PREP intake for class starting on 6/29.

## 2019-10-26 ENCOUNTER — Encounter: Payer: Self-pay | Admitting: Internal Medicine

## 2019-10-26 ENCOUNTER — Other Ambulatory Visit: Payer: Self-pay

## 2019-10-26 ENCOUNTER — Ambulatory Visit (INDEPENDENT_AMBULATORY_CARE_PROVIDER_SITE_OTHER): Payer: Commercial Managed Care - PPO | Admitting: Internal Medicine

## 2019-10-26 VITALS — BP 128/86 | HR 107 | Temp 98.7°F | Ht 60.0 in | Wt 227.0 lb

## 2019-10-26 DIAGNOSIS — F4323 Adjustment disorder with mixed anxiety and depressed mood: Secondary | ICD-10-CM

## 2019-10-26 DIAGNOSIS — E039 Hypothyroidism, unspecified: Secondary | ICD-10-CM

## 2019-10-26 DIAGNOSIS — E538 Deficiency of other specified B group vitamins: Secondary | ICD-10-CM

## 2019-10-26 LAB — VITAMIN B12: Vitamin B-12: 246 pg/mL (ref 211–911)

## 2019-10-26 LAB — T4, FREE: Free T4: 1.17 ng/dL (ref 0.60–1.60)

## 2019-10-26 LAB — TSH: TSH: 6.01 u[IU]/mL — ABNORMAL HIGH (ref 0.35–4.50)

## 2019-10-26 MED ORDER — BUPROPION HCL ER (XL) 300 MG PO TB24: 300.0000 mg | ORAL_TABLET | Freq: Every day | ORAL | 3 refills | Status: DC

## 2019-10-26 MED ORDER — CHOLESTYRAMINE LIGHT 4 G PO PACK: 4.0000 g | PACK | Freq: Two times a day (BID) | ORAL | 3 refills | Status: DC

## 2019-10-26 NOTE — Progress Notes (Signed)
   Subjective:   Patient ID: Meredith Diaz, female    DOB: August 17, 1980, 39 y.o.   MRN: [id number]  HPI The patient is a 39 YO female coming in for follow up mood (started wellbutrin 2 months ago and has not noticed much difference, less up and down moods but not happy, she is emotionally in turmoil about her weight and focusing on this all the time to the detriment of other things she knows she should focus her time on or make actions to improve health) and thyroid (taking synthroid daily again, has not noticed much difference in how she is feeling, denies side effects) and B12 deficiency (trying to remember oral daily but has a lot of medications and this can be difficult, denies numbness or weakness).   Review of Systems  Constitutional: Positive for activity change and fatigue.  HENT: Negative.   Eyes: Negative.   Respiratory: Positive for chest tightness and shortness of breath. Negative for cough.   Cardiovascular: Negative for chest pain, palpitations and leg swelling.  Gastrointestinal: Negative for abdominal distention, abdominal pain, constipation, diarrhea, nausea and vomiting.  Musculoskeletal: Negative.   Skin: Negative.   Neurological: Negative.   Psychiatric/Behavioral: Positive for decreased concentration and dysphoric mood. Negative for agitation, behavioral problems, confusion, hallucinations, self-injury and suicidal ideas. The patient is nervous/anxious.     Objective:  Physical Exam Constitutional:      Appearance: She is well-developed. She is obese.  HENT:     Head: Normocephalic and atraumatic.  Cardiovascular:     Rate and Rhythm: Normal rate and regular rhythm.     Comments: Oxygen in place continuous Pulmonary:     Effort: Pulmonary effort is normal. No respiratory distress.     Breath sounds: Normal breath sounds. No wheezing or rales.  Abdominal:     General: Bowel sounds are normal. There is no distension.     Palpations: Abdomen is soft.      Tenderness: There is no abdominal tenderness. There is no rebound.  Musculoskeletal:     Cervical back: Normal range of motion.  Skin:    General: Skin is warm and dry.  Neurological:     Mental Status: She is alert and oriented to person, place, and time.     Coordination: Coordination normal.  Psychiatric:     Comments: Mood appropriate to conversation topics     Vitals:   10/26/19 0853  BP: 128/86  Pulse: (!) 107  Temp: 98.7 F (37.1 C)  TempSrc: Oral  SpO2: 91%  Weight: 227 lb (103 kg)  Height: 5' (1.524 m)   This visit occurred during the SARS-CoV-2 public health emergency.  Safety protocols were in place, including screening questions prior to the visit, additional usage of staff PPE, and extensive cleaning of exam room while observing appropriate contact time as indicated for disinfecting solutions.   Assessment & Plan:

## 2019-10-26 NOTE — Patient Instructions (Addendum)
We have sent in the cholestyramine to use up to twice a day as needed for the stomach.  We have sent in the wellbutrin 300 mg to take daily and you can take 2 pills daily of what you have left.   Think about reading the book, You Are not Your Brain as this may be able to help you focus on things that matter and decrease the thoughts that are not helpful.

## 2019-10-26 NOTE — Assessment & Plan Note (Signed)
This is emotionally causing a lot of turmoil at present. Asked her to work on not focusing on this so much until next visit and work on other aspects of her life and health. Recommended you are not your brain to read to get some perspective.

## 2019-10-26 NOTE — Assessment & Plan Note (Signed)
Checking TSH and free T4 now that back on synthroid 75 mcg daily for 2 months.

## 2019-10-26 NOTE — Assessment & Plan Note (Signed)
Increase wellbutrin to 300 mg daily to see if this can help more. She is still seeing therapist and feels that this is helping some.

## 2019-10-26 NOTE — Assessment & Plan Note (Signed)
Checking levels and adjust as needed.

## 2019-10-27 ENCOUNTER — Other Ambulatory Visit: Payer: Self-pay | Admitting: Internal Medicine

## 2019-10-27 MED ORDER — LEVOTHYROXINE SODIUM 112 MCG PO TABS: 112.0000 ug | ORAL_TABLET | Freq: Every day | ORAL | 3 refills | Status: DC

## 2019-11-17 ENCOUNTER — Other Ambulatory Visit: Payer: Self-pay | Admitting: Internal Medicine

## 2019-11-30 ENCOUNTER — Other Ambulatory Visit: Payer: Self-pay

## 2019-11-30 ENCOUNTER — Ambulatory Visit (HOSPITAL_COMMUNITY)
Admission: RE | Admit: 2019-11-30 | Discharge: 2019-11-30 | Disposition: A | Discharge status: Home / Self Care | Payer: Commercial Managed Care - PPO | Source: Ambulatory Visit | Attending: Cardiology | Admitting: Cardiology

## 2019-11-30 ENCOUNTER — Encounter (HOSPITAL_COMMUNITY): Payer: Self-pay | Admitting: Cardiology

## 2019-11-30 VITALS — BP 100/76 | HR 97 | Wt 222.2 lb

## 2019-11-30 DIAGNOSIS — Z8249 Family history of ischemic heart disease and other diseases of the circulatory system: Secondary | ICD-10-CM | POA: Insufficient documentation

## 2019-11-30 DIAGNOSIS — E039 Hypothyroidism, unspecified: Secondary | ICD-10-CM | POA: Diagnosis not present

## 2019-11-30 DIAGNOSIS — I428 Other cardiomyopathies: Secondary | ICD-10-CM | POA: Diagnosis not present

## 2019-11-30 DIAGNOSIS — Z6841 Body Mass Index (BMI) 40.0 and over, adult: Secondary | ICD-10-CM | POA: Diagnosis not present

## 2019-11-30 DIAGNOSIS — Z888 Allergy status to other drugs, medicaments and biological substances status: Secondary | ICD-10-CM | POA: Insufficient documentation

## 2019-11-30 DIAGNOSIS — E669 Obesity, unspecified: Secondary | ICD-10-CM | POA: Diagnosis not present

## 2019-11-30 DIAGNOSIS — F329 Major depressive disorder, single episode, unspecified: Secondary | ICD-10-CM | POA: Diagnosis not present

## 2019-11-30 DIAGNOSIS — I5042 Chronic combined systolic (congestive) and diastolic (congestive) heart failure: Secondary | ICD-10-CM

## 2019-11-30 DIAGNOSIS — Z79899 Other long term (current) drug therapy: Secondary | ICD-10-CM | POA: Insufficient documentation

## 2019-11-30 DIAGNOSIS — J45909 Unspecified asthma, uncomplicated: Secondary | ICD-10-CM | POA: Insufficient documentation

## 2019-11-30 DIAGNOSIS — Z7951 Long term (current) use of inhaled steroids: Secondary | ICD-10-CM | POA: Insufficient documentation

## 2019-11-30 DIAGNOSIS — M419 Scoliosis, unspecified: Secondary | ICD-10-CM | POA: Diagnosis not present

## 2019-11-30 DIAGNOSIS — I5022 Chronic systolic (congestive) heart failure: Secondary | ICD-10-CM | POA: Insufficient documentation

## 2019-11-30 DIAGNOSIS — G4733 Obstructive sleep apnea (adult) (pediatric): Secondary | ICD-10-CM | POA: Diagnosis not present

## 2019-11-30 LAB — BASIC METABOLIC PANEL
Anion gap: 12 (ref 5–15)
BUN: 13 mg/dL (ref 6–20)
CO2: 34 mmol/L — ABNORMAL HIGH (ref 22–32)
Calcium: 8.3 mg/dL — ABNORMAL LOW (ref 8.9–10.3)
Chloride: 89 mmol/L — ABNORMAL LOW (ref 98–111)
Creatinine, Ser: 1.01 mg/dL — ABNORMAL HIGH (ref 0.44–1.00)
GFR calc Af Amer: >60 mL/min (ref >60)
GFR calc non Af Amer: >60 mL/min (ref >60)
Glucose, Bld: 120 mg/dL — ABNORMAL HIGH (ref 70–99)
Potassium: 3 mmol/L — ABNORMAL LOW (ref 3.5–5.1)
Sodium: 135 mmol/L (ref 135–145)

## 2019-11-30 LAB — BRAIN NATRIURETIC PEPTIDE: B Natriuretic Peptide: 34.5 pg/mL (ref 0.0–100.0)

## 2019-11-30 MED ORDER — TORSEMIDE 20 MG PO TABS: 80.0000 mg | ORAL_TABLET | Freq: Two times a day (BID) | ORAL | 3 refills | Status: DC

## 2019-11-30 MED ORDER — POTASSIUM CHLORIDE ER 20 MEQ PO TBCR: 60.0000 meq | EXTENDED_RELEASE_TABLET | Freq: Every day | ORAL | 2 refills | Status: DC

## 2019-11-30 NOTE — Progress Notes (Signed)
ReDS Vest / Clip - 11/30/19 1400      ReDS Vest / Clip   Station Marker B    Ruler Value 33    ReDS Value Range Moderate volume overload    ReDS Actual Value 38    Anatomical Comments Sitting

## 2019-11-30 NOTE — Patient Instructions (Signed)
Increase Torsemide 80mg  Twice daily   Increase Potassium Daily   Labs done today, your results will be available in MyChart, we will contact you for abnormal readings.   Your physician recommends that you return for lab work in: 1 week    Follow up appointment in 3 months.  If you have any questions or concerns before your next appointment please send a message through [facility] or call our office at [phone].    TO LEAVE A MESSAGE FOR THE NURSE SELECT OPTION 2, PLEASE LEAVE A MESSAGE INCLUDING: . YOUR NAME . DATE OF BIRTH . CALL BACK NUMBER . REASON FOR CALL**this is important as we prioritize the call backs  YOU WILL RECEIVE A CALL BACK THE SAME DAY AS LONG AS YOU CALL BEFORE 4:00 PM   At the [facility], you and your health needs are our priority. As part of our continuing mission to provide you with exceptional heart care, we have created designated Provider Care Teams. These Care Teams include your primary Cardiologist (physician) and Advanced Practice Providers (APPs- Physician Assistants and Nurse Practitioners) who all work together to provide you with the care you need, when you need it.   You may see any of the following providers on your designated Care Team at your next follow up: [phone] Dr [name] . Dr Arvilla Meres . Marca Ancona, NP . Tonye Becket, PA . Robbie Lis, PharmD   Please be sure to bring in all your medications bottles to every appointment.

## 2019-11-30 NOTE — Progress Notes (Signed)
Date:  11/30/2019   ID:  Meredith Diaz, DOB [birth year], MRN [id number]  Provider location:  [facility] Type of Visit: Established patient   PCP:  Myrlene Broker, MD  Cardiologist:  Dr. Shirlee Latch  Chief Complaint: F/u for Chronic Diastolic HF.    History of Present Illness: Meredith Diaz is a 39 y.o. female who has a history of nonischemic cardiomyopathy.  This was diagnosed in 2013.  EF was initially 25-30%. Cardiac cath at that time showed no significant CAD.  Cardiac MRI showed no late gadoliniuim enhancement.  Since that time, EF has improved.  Most recent echo in 03/2018 showed had normalized to 50-55%.  She also has asthma.  PFTs showed a severe restrictive defect that may be due to body habitus primarily.   She was admitted in 8/16 with chest pain.  CTA chest showed no PE, lung parenchyma looked normal.  She had ETT with poor exercise tolerance but no evidence for ischemia.  CPX was done in 12/16, showing moderate functional limitation due primarily to lung restriction.   Admitted 11/4 - 03/13/18 for increased DOE. Diuresed well with IV lasix and treated for asthma exacerbation. Treated with solumedrol and prednisone burst. Echo with EF 50-55%, mildly dilated/dysfunctional RV, septal bounce noted but IVC normal in size.   Due to increased dyspnea, RHC was done in 3/20.  Right and left heart filling pressures were elevated with near equalization of pressures suggesting restrictive cardiomyopathy.  No evidence of pericardial constriction.  Her torsemide was increased to 40 mg bid.   She has seen pulmonary.  No evidence for interstitial lung disease.  Autoimmune workup was negative.  She completed pulmonary rehab.   She was seen by Dr. Shirlee Latch 3/21 and was volume overloaded by exam and by ReDs clip, 55%. SOB walking short distances. She was ordered to increase her torsemide to 80 mg bid and to take a dose of metolazone. Had return f/u 4/21 and  volume/ symptoms were improved. ReDs clip was 32%. She was advised to continue torsemide 80 mg bid and take metolazone only PRN.   She returns today for f/u. Continues w/ exertional dyspnea and requires supp O2 w/ physical activities (2L/min). No symptoms at rest. No wt gain. Her wt is acutally down 11 lb since her OV in March, but ReDs clip elevated at 38%. She tells me that she is currently taking a lower dose of torsemide than prescribed, she is taking 80 mg qam and only 40 mg qpm. She does not notice any significant increased urination in the afternoons/eveings. Also admits that she has not been fully compliant w/ CPAP. She sleeps with it, on average, only 2-3 nights a week.   REDS clip: 38%  Labs (9/16): K 3.2, creatinine 1.11, hgb 14.3 Labs (12/16): K 3.3, creatinine 0.96, BNP 23 Labs (2/17): K 4.7, creatinine 0.95, TSH normal Labs (8/17): K 3.9, creatinine 0.93 Labs (12/18): K 3.6, creatinine 0.82, pro-BNP 42, TSH normal Labs (12/19): K 4.4, creatinine 0.9 Labs (2/20): BNP 47, hgb 13.1, K 4.4, creatinine 0.8 Labs (5/20): K 4.2, creatinine 1.01 Labs (9/20): K 3.8, creatinine 0.85, Rf negative, CCP negative, ANA negative, dsDNA negative, SCL-70 negative Labs (11/20): K 3.4, creatinine 0.96 Labs (4/21): K 3.6, creatinine 0.81   PMH: 1. Chronic systolic CHF: Nonischemic cardiomyopathy.  - EF 25-30% by echo in 2013.  - Cardiac MRI (2013) with EF 52%, no delayed enhancement.  - LHC in 2013 without significant CAD. - 11/15  echo with EF 40-45%.   - 7/16 echo with EF 40-45%, grade II diastolic dysfunction.  - ETT (1/61) with poor exercise tolerance but no ischemia.  - CPX (12/16): peak VO2 15.7 (23.5 adjusted for ideal body weight), VE/VCO2 slope 21, RER 1.15 => moderate functional limitation thought to be primarily due to restrictive lung physiology.  - Echo (11/17): EF 45-50%, mild diffuse hypokinesis, normal RV size and systolic function.  - Echo (11/19): EF 50-55%, mildly dilated and  mildly dysfunctional RV with respirophasic septal bounce noted, normal IVC and PA pressure estimate.  - RHC (3/20): mean 19, PA 54/18 mean 38, mean PCWP 23, CI 2.77, PVR 2.79 WU. Equalization of pressures suggestive of restrictive cardiomyopathy, not suggestive of pericardial constriction.  2. Asthma: PFTs (4/16) with mild obstruction, severe restriction and severely decreased DLCO => think primarily due to body habitus.  3. Depression 4. Hypothyroidism 5. 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  6. Angioedema with ACEI 7. OHS/OSA: CPAP at night, oxygen during the day.  8. Chronic leukocytosis: Negative workup.  9. BPPV 10. Obesity  11. Scoliosis: CT chest (5/20) showed no ILD, severe scoliosis.    Current Outpatient Medications  Medication Sig Dispense Refill  . albuterol (VENTOLIN HFA) 108 (90 Base) MCG/ACT inhaler INHALE 1-2 PUFFS INTO THE LUNGS EVERY 6 HOURS AS NEEDED FOR WHEEZING OR SHORTNESS OF BREATH. 18 g 3  . budesonide-formoterol (SYMBICORT) 160-4.5 MCG/ACT inhaler TAKE 2 PUFFS BY MOUTH TWICE A DAY 10.2 Inhaler 5  . buPROPion (WELLBUTRIN XL) 300 MG 24 hr tablet Take 1 tablet (300 mg total) by mouth daily. 90 tablet 3  . cholestyramine light (PREVALITE) 4 g packet Take 1 packet (4 g total) by mouth 2 (two) times daily. 180 packet 3  . clobetasol cream (TEMOVATE) 0.05 % Apply 1 application topically See admin instructions. Apply daily to affected areas of genital area as directed    . ipratropium-albuterol (DUONEB) 0.5-2.5 (3) MG/3ML SOLN Take 3 mLs by nebulization 2 (two) times daily as needed (For wheezing or shoortness of breath). (Patient taking differently: Take 3 mLs by nebulization 2 (two) times daily as needed (For wheezing or shortness of breath). ) 180 mL 5  . levothyroxine (SYNTHROID) 112 MCG tablet Take 1 tablet (112 mcg total) by mouth daily. 90 tablet 3  . metoprolol succinate (TOPROL-XL) 25 MG 24 hr tablet TAKE 1 TABLET BY MOUTH EVERYDAY AT BEDTIME 90  tablet 3  . Potassium Chloride ER 20 MEQ TBCR Take 60 mEq by mouth daily. Take an extra 20 meq on days you take Metolazone as directed by HF clinic 180 tablet 2  . spironolactone (ALDACTONE) 25 MG tablet TAKE 1 TABLET BY MOUTH EVERY DAY 30 tablet 11  . torsemide (DEMADEX) 20 MG tablet Take 4 tablets (80 mg total) by mouth 2 (two) times daily. 320 tablet 3  . valsartan (DIOVAN) 80 MG tablet Take 1 tablet (80 mg total) by mouth daily. 60 tablet 6  . Vitamin D, Ergocalciferol, (DRISDOL) 1.25 MG (50000 UNIT) CAPS capsule TAKE 1 CAPSULE (50,000 UNITS TOTAL) BY MOUTH EVERY 7 (SEVEN) DAYS. (Patient taking differently: Take 50,000 Units by mouth every 30 (thirty) days. ) 4 capsule 2   No current facility-administered medications for this encounter.    Allergies:   Lisinopril   Social History:  The patient  reports that she has never smoked. She has never used smokeless tobacco. She reports that she does not drink alcohol and does not use drugs.   Family History:  The patient's family history includes Alcoholism in her mother; Asthma in her paternal grandmother; Breast cancer (age of onset: 14) in her paternal grandmother; Breast cancer (age of onset: 65) in her maternal aunt and paternal aunt; Colon cancer (age of onset: 52) in her maternal grandmother; Depression in her mother; Heart attack in her maternal grandfather, maternal grandmother, paternal grandfather, and paternal grandmother; Heart disease in her father, maternal grandfather, and paternal grandmother; Hypertension in her father.   ROS:  Please see the history of present illness.   All other systems are personally reviewed and negative.   Exam:   BP 100/76   Pulse 97   Wt (!) 100.8 kg (222 lb 3.2 oz)   SpO2 97% Comment: 2L of oxygen  BMI 43.40 kg/m   Wt Readings from Last 3 Encounters:  11/30/19 (!) 100.8 kg (222 lb 3.2 oz)  10/26/19 103 kg (227 lb)  08/25/19 103.8 kg (228 lb 12.8 oz)   ReDs clip 38%  General:  Chronically ill  young female on supp O2 via [place].  No resp difficulty HEENT: normal Neck: supple. Thick neck, no visible JVD. Carotids 2+ bilat; no bruits. No lymphadenopathy or thryomegaly appreciated. Cor: PMI nondisplaced. Regular rate & rhythm. No rubs, gallops or murmurs. Lungs: decreased BS at the bases bilaterally, otherwise clear    Abdomen: obese, soft, nontender, nondistended. No hepatosplenomegaly. No bruits or masses. Good bowel sounds. Extremities: no cyanosis, clubbing, rash, edema Neuro: alert & orientedx3, cranial nerves grossly intact. moves all 4 extremities w/o difficulty. Affect pleasant   Recent Labs: 08/25/2019: ALT 17; BUN 16; Creatinine, Ser 0.81; Hemoglobin 12.9; Platelets 382.0; Potassium 3.6; Pro B Natriuretic peptide (BNP) 35.0; Sodium 138 10/26/2019: TSH 6.01  Personally reviewed   Wt Readings from Last 3 Encounters:  11/30/19 (!) 100.8 kg (222 lb 3.2 oz)  10/26/19 103 kg (227 lb)  08/25/19 103.8 kg (228 lb 12.8 oz)     ASSESSMENT AND PLAN:  1. Chronic systolic CHF: Nonischemic cardiomyopathy.  No CAD on prior cath, prior cMRI showed no late gadolinium enhancement.  No family history of cardiomyopathy.  No ETOH/substance abuse.  No uncontrolled HTN.  Possible that she had viral myocarditis. Last echo in  03/11/18 showed LVEF up to 50-55% but with mildly dilated/dysfunctional RV, IVC appeared normal and PA pressure estimation was normal, septal bounce was noted.  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  No ventricular interdependence to suggest pericardial constriction.  - NYHA III, chronic. ReDs clip elevated at 38%.  - Increase torsemide back to 80 mg bid, as previously prescribed (currently only taking 80 qam/40 qpm, with minimal UOP in the late afternoons/evening). Increase KCl to 60 mEq daily.  - Check BMP today and again in 7 days  - Continue Toprol XL 25 mg daily.  - Continue  spironolactone 25 daily.   - Continue valsartan 80 mg daily.   - Allergic to lisinopril so no entresto.  2. Asthma: No wheezing on exam.  3. OHS/OSA: OHS is likely due to lung restriction from obesity and scoliosis, CT chest in 5/20 showed no ILD but did show severe scoliosis.  OHS/OSA plays a large role in her dyspnea.   - She admits to poor compliance w/ CPAP. Discussed importance of nightly CPAP.  She will work to improve compliance rate 4. Obesity:  Body mass index is 43.4 kg/m.'Followed by dietitian.   -Previously referred to [facility] for bariatric  surgery evaluation, however she has not been evaluated yet. She reports that she does not want to consider weight loss surgery at this time  F/u again in 3 months   Signed, Robbie Lis, PA-C  11/30/2019  [facility] [address] [facility] [place] [address] [phone] (office) [phone] (fax)

## 2019-12-03 ENCOUNTER — Telehealth (HOSPITAL_COMMUNITY): Payer: Self-pay

## 2019-12-03 MED ORDER — POTASSIUM CHLORIDE ER 20 MEQ PO TBCR: 40.0000 meq | EXTENDED_RELEASE_TABLET | Freq: Two times a day (BID) | ORAL | 2 refills | Status: DC

## 2019-12-03 NOTE — Telephone Encounter (Signed)
Patient advised and verbalized understanding. Med list updated to reflect changes.  ? ?

## 2019-12-03 NOTE — Telephone Encounter (Signed)
-----   Message from Laurey Morale, MD sent at 12/02/2019  5:34 PM EDT ----- She should take 40 mEq bid.

## 2019-12-07 ENCOUNTER — Ambulatory Visit (HOSPITAL_COMMUNITY)
Admission: RE | Admit: 2019-12-07 | Discharge: 2019-12-07 | Disposition: A | Discharge status: Home / Self Care | Payer: Commercial Managed Care - PPO | Source: Ambulatory Visit | Attending: Cardiology | Admitting: Cardiology

## 2019-12-07 ENCOUNTER — Other Ambulatory Visit: Payer: Self-pay

## 2019-12-07 DIAGNOSIS — I5042 Chronic combined systolic (congestive) and diastolic (congestive) heart failure: Secondary | ICD-10-CM | POA: Diagnosis present

## 2019-12-07 LAB — BRAIN NATRIURETIC PEPTIDE: B Natriuretic Peptide: 49 pg/mL (ref 0.0–100.0)

## 2019-12-07 LAB — BASIC METABOLIC PANEL
Anion gap: 13 (ref 5–15)
BUN: 17 mg/dL (ref 6–20)
CO2: 27 mmol/L (ref 22–32)
Calcium: 8.8 mg/dL — ABNORMAL LOW (ref 8.9–10.3)
Chloride: 98 mmol/L (ref 98–111)
Creatinine, Ser: 0.87 mg/dL (ref 0.44–1.00)
GFR calc Af Amer: >60 mL/min (ref >60)
GFR calc non Af Amer: >60 mL/min (ref >60)
Glucose, Bld: 134 mg/dL — ABNORMAL HIGH (ref 70–99)
Potassium: 4.3 mmol/L (ref 3.5–5.1)
Sodium: 138 mmol/L (ref 135–145)

## 2020-02-12 ENCOUNTER — Other Ambulatory Visit: Payer: Self-pay | Admitting: Pulmonary Disease

## 2020-02-20 ENCOUNTER — Other Ambulatory Visit: Payer: Self-pay | Admitting: Pulmonary Disease

## 2020-03-01 ENCOUNTER — Other Ambulatory Visit: Payer: Self-pay

## 2020-03-01 ENCOUNTER — Ambulatory Visit (HOSPITAL_COMMUNITY)
Admission: RE | Admit: 2020-03-01 | Discharge: 2020-03-01 | Disposition: A | Discharge status: Home / Self Care | Payer: Commercial Managed Care - PPO | Source: Ambulatory Visit | Attending: Cardiology | Admitting: Cardiology

## 2020-03-01 ENCOUNTER — Encounter (HOSPITAL_COMMUNITY): Payer: Self-pay | Admitting: Cardiology

## 2020-03-01 ENCOUNTER — Telehealth (HOSPITAL_COMMUNITY): Payer: Self-pay

## 2020-03-01 VITALS — BP 120/78 | HR 90 | Wt 221.8 lb

## 2020-03-01 DIAGNOSIS — G4733 Obstructive sleep apnea (adult) (pediatric): Secondary | ICD-10-CM | POA: Insufficient documentation

## 2020-03-01 DIAGNOSIS — E669 Obesity, unspecified: Secondary | ICD-10-CM | POA: Insufficient documentation

## 2020-03-01 DIAGNOSIS — I428 Other cardiomyopathies: Secondary | ICD-10-CM | POA: Insufficient documentation

## 2020-03-01 DIAGNOSIS — I5042 Chronic combined systolic (congestive) and diastolic (congestive) heart failure: Secondary | ICD-10-CM

## 2020-03-01 DIAGNOSIS — J45909 Unspecified asthma, uncomplicated: Secondary | ICD-10-CM | POA: Diagnosis not present

## 2020-03-01 DIAGNOSIS — Z6841 Body Mass Index (BMI) 40.0 and over, adult: Secondary | ICD-10-CM | POA: Diagnosis not present

## 2020-03-01 DIAGNOSIS — Z7901 Long term (current) use of anticoagulants: Secondary | ICD-10-CM | POA: Insufficient documentation

## 2020-03-01 DIAGNOSIS — R0602 Shortness of breath: Secondary | ICD-10-CM | POA: Diagnosis present

## 2020-03-01 LAB — BASIC METABOLIC PANEL
Anion gap: 13 (ref 5–15)
BUN: 12 mg/dL (ref 6–20)
CO2: 32 mmol/L (ref 22–32)
Calcium: 9 mg/dL (ref 8.9–10.3)
Chloride: 94 mmol/L — ABNORMAL LOW (ref 98–111)
Creatinine, Ser: 0.92 mg/dL (ref 0.44–1.00)
GFR, Estimated: >60 mL/min (ref >60)
Glucose, Bld: 145 mg/dL — ABNORMAL HIGH (ref 70–99)
Potassium: 3.2 mmol/L — ABNORMAL LOW (ref 3.5–5.1)
Sodium: 139 mmol/L (ref 135–145)

## 2020-03-01 MED ORDER — POTASSIUM CHLORIDE CRYS ER 20 MEQ PO TBCR
20.0000 meq | EXTENDED_RELEASE_TABLET | Freq: Every day | ORAL | 3 refills | Status: DC
Start: 2020-03-01 — End: 2021-07-03

## 2020-03-01 MED ORDER — SPIRONOLACTONE 25 MG PO TABS: 25.0000 mg | ORAL_TABLET | Freq: Every day | ORAL | 11 refills | Status: DC

## 2020-03-01 NOTE — Patient Instructions (Signed)
Labs done today, your results will be available in MyChart, we will contact you for abnormal readings.  Please call our office in March 2022 to schedule your follow up appointment and echocardiogram  If you have any questions or concerns before your next appointment please send us a message through mychart or call our office at [phone].    TO LEAVE A MESSAGE FOR THE NURSE SELECT OPTION 2, PLEASE LEAVE A MESSAGE INCLUDING: . YOUR NAME . DATE OF BIRTH . CALL BACK NUMBER . REASON FOR CALL**this is important as we prioritize the call backs  YOU WILL RECEIVE A CALL BACK THE SAME DAY AS LONG AS YOU CALL BEFORE 4:00 PM  At the [facility], you and your health needs are our priority. As part of our continuing mission to provide you with exceptional heart care, we have created designated Provider Care Teams. These Care Teams include your primary Cardiologist (physician) and Advanced Practice Providers (APPs- Physician Assistants and Nurse Practitioners) who all work together to provide you with the care you need, when you need it.   You may see any of the following providers on your designated Care Team at your next follow up: . Dr Daniel Bensimhon . Dr Dalton McLean . Amy Clegg, NP . Brittainy Simmons, PA . Lauren Kemp, PharmD   Please be sure to bring in all your medications bottles to every appointment.    

## 2020-03-01 NOTE — Telephone Encounter (Signed)
-----   Message from Laurey Morale, MD sent at 03/01/2020  4:06 PM EDT ----- Add KCl 20 daily.  BMET 1 week.

## 2020-03-01 NOTE — Telephone Encounter (Signed)
Samara Snide, RN  03/01/2020 4:37 PM EDT Back to Top    Patient advised and verbalized understanding. Med list updated. Lab appt scheduled and lab orders placed

## 2020-03-01 NOTE — Progress Notes (Signed)
Date:  03/01/2020   ID:  [name], DOB 1981-03-31, MRN [id number]  Provider location: [place] [facility] Type of Visit: Established patient   PCP:  Myrlene Broker, MD  Cardiologist:  Dr. Shirlee Latch  Chief Complaint: Shortness of breath   History of Present Illness: Meredith Diaz is a 39 y.o. female who has a history of nonischemic cardiomyopathy.  This was diagnosed in 2013.  EF was initially 25-30%. Cardiac cath at that time showed no significant CAD.  Cardiac MRI showed no late gadoliniuim enhancement.  Since that time, EF has remained low but has improved a bit.  Most recent echo in 11/17 showed EF 45-50% with diffuse hypokinesis.  She also has asthma.  PFTs showed a severe restrictive defect that may be due to body habitus primarily.   She was admitted in 8/16 with chest pain.  CTA chest showed no PE, lung parenchyma looked normal.  She had ETT with poor exercise tolerance but no evidence for ischemia.  CPX was done in 12/16, showing moderate functional limitation due primarily to lung restriction.   Admitted 11/4 - 03/13/18 for increased DOE. Diuresed well with IV lasix and treated for asthma exacerbation. Treated with solumedrol and prednisone burst. Echo with EF 50-55%, mildly dilated/dysfunctional RV, septal bounce noted but IVC normal in size.   Due to increased dyspnea, RHC was done in 3/20.  Right and left heart filling pressures were elevated with near equalization of pressures suggesting restrictive cardiomyopathy.  No evidence of pericardial constriction.  I increased her torsemide to 40 mg bid.   She has seen pulmonary.  No evidence for interstitial lung disease.  Autoimmune workup was negative.  She completed pulmonary rehab.   She returns for followup of CHF.  She is using oxygen during the day and CPAP on most nights. She is taking torsemide 80 qam/60 qpm.  Weight is down 1 lb.  She has been walking for exercise, takes her dog out  every day.  She is short of breath walking up stairs and up hills, generally ok on flat ground. She continues to have a hard time with weight loss.   Labs (9/16): K 3.2, creatinine 1.11, hgb 14.3 Labs (12/16): K 3.3, creatinine 0.96, BNP 23 Labs (2/17): K 4.7, creatinine 0.95, TSH normal Labs (8/17): K 3.9, creatinine 0.93 Labs (12/18): K 3.6, creatinine 0.82, pro-BNP 42, TSH normal Labs (12/19): K 4.4, creatinine 0.9 Labs (2/20): BNP 47, hgb 13.1, K 4.4, creatinine 0.8 Labs (5/20): K 4.2, creatinine 1.01 Labs (9/20): K 3.8, creatinine 0.85, Rf negative, CCP negative, ANA negative, dsDNA negative, SCL-70 negative Labs (11/20): K 3.4, creatinine 0.96 Labs (8/21): BNP 49, K 4.3, creatinine 0.87  PMH: 1. Chronic systolic CHF: Nonischemic cardiomyopathy.  - EF 25-30% by echo in 2013.  - Cardiac MRI (2013) with EF 52%, no delayed enhancement.  - LHC in 2013 without significant CAD. - 11/15 echo with EF 40-45%.   - 7/16 echo with EF 40-45%, grade II diastolic dysfunction.  - ETT (6/29) with poor exercise tolerance but no ischemia.  - CPX (12/16): peak VO2 15.7 (23.5 adjusted for ideal body weight), VE/VCO2 slope 21, RER 1.15 => moderate functional limitation thought to be primarily due to restrictive lung physiology.  - Echo (11/17): EF 45-50%, mild diffuse hypokinesis, normal RV size and systolic function.  - Echo (11/19): EF 50-55%, mildly dilated and mildly dysfunctional RV with respirophasic septal bounce noted, normal IVC and PA pressure estimate.  -  RHC (3/20): mean 19, PA 54/18 mean 38, mean PCWP 23, CI 2.77, PVR 2.79 WU. Equalization of pressures suggestive of restrictive cardiomyopathy, not suggestive of pericardial constriction.  2. Asthma: PFTs (4/16) with mild obstruction, severe restriction and severely decreased DLCO => think primarily due to body habitus.  3. Depression 4. Hypothyroidism 5. 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  6. Angioedema with ACEI 7.  OHS/OSA: CPAP at night, oxygen during the day.  8. Chronic leukocytosis: Negative workup.  9. BPPV 10. Obesity  11. Scoliosis: CT chest (5/20) showed no ILD, severe scoliosis.    Current Outpatient Medications  Medication Sig Dispense Refill  . albuterol (VENTOLIN HFA) 108 (90 Base) MCG/ACT inhaler INHALE 1-2 PUFFS INTO THE LUNGS EVERY 6 HOURS AS NEEDED FOR WHEEZING OR SHORTNESS OF BREATH. 18 g 3  . budesonide-formoterol (SYMBICORT) 160-4.5 MCG/ACT inhaler TAKE 2 PUFFS BY MOUTH TWICE A DAY 10.2 Inhaler 5  . buPROPion (WELLBUTRIN XL) 300 MG 24 hr tablet Take 1 tablet (300 mg total) by mouth daily. 90 tablet 3  . cholestyramine light (PREVALITE) 4 g packet Take 1 packet (4 g total) by mouth 2 (two) times daily. 180 packet 3  . clobetasol cream (TEMOVATE) 0.05 % Apply 1 application topically See admin instructions. Apply daily to affected areas of genital area as directed    . ipratropium-albuterol (DUONEB) 0.5-2.5 (3) MG/3ML SOLN Take 3 mLs by nebulization 2 (two) times daily as needed (For wheezing or shoortness of breath). (Patient taking differently: Take 3 mLs by nebulization 2 (two) times daily as needed (For wheezing or shortness of breath). ) 180 mL 5  . levothyroxine (SYNTHROID) 112 MCG tablet Take 1 tablet (112 mcg total) by mouth daily. 90 tablet 3  . metoprolol succinate (TOPROL-XL) 25 MG 24 hr tablet TAKE 1 TABLET BY MOUTH EVERYDAY AT BEDTIME 90 tablet 3  . spironolactone (ALDACTONE) 25 MG tablet Take 1 tablet (25 mg total) by mouth daily. 30 tablet 11  . torsemide (DEMADEX) 20 MG tablet Take by mouth 2 (two) times daily. 4 tabs in am,# tabs in pm    . valsartan (DIOVAN) 80 MG tablet Take 1 tablet (80 mg total) by mouth daily. 60 tablet 6  . Vitamin D, Ergocalciferol, (DRISDOL) 1.25 MG (50000 UNIT) CAPS capsule TAKE 1 CAPSULE (50,000 UNITS TOTAL) BY MOUTH EVERY 7 (SEVEN) DAYS. (Patient taking differently: Take 50,000 Units by mouth every 30 (thirty) days. ) 4 capsule 2  . potassium  chloride SA (KLOR-CON) 20 MEQ tablet Take 1 tablet (20 mEq total) by mouth daily. 30 tablet 3   No current facility-administered medications for this encounter.    Allergies:   Lisinopril   Social History:  The patient  reports that she has never smoked. She has never used smokeless tobacco. She reports that she does not drink alcohol and does not use drugs.   Family History:  The patient's family history includes Alcoholism in her mother; Asthma in her paternal grandmother; Breast cancer (age of onset: 93) in her paternal grandmother; Breast cancer (age of onset: 11) in her maternal aunt and paternal aunt; Colon cancer (age of onset: 5) in her maternal grandmother; Depression in her mother; Heart attack in her maternal grandfather, maternal grandmother, paternal grandfather, and paternal grandmother; Heart disease in her father, maternal grandfather, and paternal grandmother; Hypertension in her father.   ROS:  Please see the history of present illness.   All other systems are personally reviewed and negative.   Exam:   BP  120/78   Pulse 90   Wt 100.6 kg (221 lb 12.8 oz)   SpO2 95% Comment: 2 liters n/c  BMI 43.32 kg/m  General: NAD Neck: No JVD, no thyromegaly or thyroid nodule.  Lungs: Clear to auscultation bilaterally with normal respiratory effort. CV: Nondisplaced PMI.  Heart regular S1/S2, no S3/S4, no murmur.  No peripheral edema.  No carotid bruit.  Normal pedal pulses.  Abdomen: Soft, nontender, no hepatosplenomegaly, no distention.  Skin: Intact without lesions or rashes.  Neurologic: Alert and oriented x 3.  Psych: Normal affect. Extremities: No clubbing or cyanosis.  HEENT: Normal.    Recent Labs: 08/25/2019: ALT 17; Hemoglobin 12.9; Platelets 382.0; Pro B Natriuretic peptide (BNP) 35.0 10/26/2019: TSH 6.01 12/07/2019: B Natriuretic Peptide 49.0 03/01/2020: BUN 12; Creatinine, Ser 0.92; Potassium 3.2; Sodium 139  Personally reviewed   Wt Readings from Last 3  Encounters:  03/01/20 100.6 kg (221 lb 12.8 oz)  11/30/19 (!) 100.8 kg (222 lb 3.2 oz)  10/26/19 103 kg (227 lb)     ASSESSMENT AND PLAN:  1. Chronic systolic CHF: Nonischemic cardiomyopathy.  No CAD on prior cath, prior cMRI showed no late gadolinium enhancement.  No family history of cardiomyopathy.  No ETOH/substance abuse.  No uncontrolled HTN.  Possible that she had viral myocarditis. Last echo in  03/11/18 showed LVEF up to 50-55% but with mildly dilated/dysfunctional RV, IVC appeared normal and PA pressure estimation was normal, septal bounce was noted.  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  No ventricular interdependence to suggest pericardial constriction. NYHA class II-III symptoms, stable. I do not think that she is significantly volume overloaded. I think that a lot of her symptomatology is due to restriction from excess weight => OHS/OSA.  - Continue torsemide 80 qam/60 qpm.  BMET today.  - Continue Toprol XL 25 mg daily.  - Continue spironolactone 25 daily.   - Continue valsartan 80 mg daily.   - Repeat echo at followup.  2. Asthma: No wheezing on exam.  3. OHS/OSA: She uses CPAP at night and oxygen during the day.  OHS is likely due to lung restriction from obesity and scoliosis, CT chest in 5/20 showed no ILD but did show severe scoliosis.  I think OHS/OSA plays a large role in her dyspnea.  She has had a hard time using CPAP but is trying.  4. Obesity: Weight loss is imperative.  She tried the [facility] but did not find it helpful.  Not interested in bariatric surgery right now.   Followup in 6 months with echo.   Signed, Marca Ancona, MD  03/01/2020  [facility] [address] [facility] [place] [address] [phone] (office) [phone] (fax)

## 2020-03-10 ENCOUNTER — Other Ambulatory Visit: Payer: Self-pay

## 2020-03-10 ENCOUNTER — Ambulatory Visit (HOSPITAL_COMMUNITY)
Admission: RE | Admit: 2020-03-10 | Discharge: 2020-03-10 | Disposition: A | Discharge status: Home / Self Care | Payer: Commercial Managed Care - PPO | Source: Ambulatory Visit | Attending: Cardiology | Admitting: Cardiology

## 2020-03-10 DIAGNOSIS — I5042 Chronic combined systolic (congestive) and diastolic (congestive) heart failure: Secondary | ICD-10-CM | POA: Diagnosis present

## 2020-03-10 LAB — BASIC METABOLIC PANEL
Anion gap: 11 (ref 5–15)
BUN: 13 mg/dL (ref 6–20)
CO2: 31 mmol/L (ref 22–32)
Calcium: 9.1 mg/dL (ref 8.9–10.3)
Chloride: 94 mmol/L — ABNORMAL LOW (ref 98–111)
Creatinine, Ser: 1.04 mg/dL — ABNORMAL HIGH (ref 0.44–1.00)
GFR, Estimated: >60 mL/min (ref >60)
Glucose, Bld: 185 mg/dL — ABNORMAL HIGH (ref 70–99)
Potassium: 3.9 mmol/L (ref 3.5–5.1)
Sodium: 136 mmol/L (ref 135–145)

## 2020-04-19 ENCOUNTER — Other Ambulatory Visit (HOSPITAL_COMMUNITY): Payer: Self-pay | Admitting: Cardiology

## 2020-04-19 DIAGNOSIS — I5042 Chronic combined systolic (congestive) and diastolic (congestive) heart failure: Secondary | ICD-10-CM

## 2020-04-25 ENCOUNTER — Other Ambulatory Visit (HOSPITAL_COMMUNITY): Payer: Self-pay | Admitting: Cardiology

## 2020-05-17 ENCOUNTER — Other Ambulatory Visit: Payer: Self-pay | Admitting: Pulmonary Disease

## 2020-06-08 ENCOUNTER — Ambulatory Visit (INDEPENDENT_AMBULATORY_CARE_PROVIDER_SITE_OTHER): Payer: Commercial Managed Care - PPO | Admitting: Pulmonary Disease

## 2020-06-08 ENCOUNTER — Encounter: Payer: Self-pay | Admitting: Pulmonary Disease

## 2020-06-08 ENCOUNTER — Other Ambulatory Visit: Payer: Self-pay

## 2020-06-08 VITALS — BP 140/80 | HR 90 | Temp 97.0°F | Ht 60.0 in | Wt 220.0 lb

## 2020-06-08 DIAGNOSIS — Z9989 Dependence on other enabling machines and devices: Secondary | ICD-10-CM | POA: Diagnosis not present

## 2020-06-08 DIAGNOSIS — G4733 Obstructive sleep apnea (adult) (pediatric): Secondary | ICD-10-CM | POA: Diagnosis not present

## 2020-06-08 NOTE — Patient Instructions (Signed)
Obstructive sleep apnea  Continue with CPAP use Hopefully the new mask will fit better and will be able to get back to using CPAP on a regular basis  Continue weight loss efforts  Graded exercises as tolerated  Call with any significant concerns  I will see you back in about 6 months

## 2020-06-08 NOTE — Progress Notes (Signed)
Subjective:    Patient ID: Meredith Diaz, female    DOB: 04-07-1981, 40 y.o.   MRN: [id number]  Patient with a history of obstructive sleep apnea  Has not been using CPAP Has been having mask issues which she is trying to resolve She will be obtaining a new mask in about a week, hoping that this helps  Breathing feels about the same No recent exacerbation  Continues to use Symbicort on a regular basis and it does seem to help  Multiple comorbidities including systolic and diastolic congestive heart failure, hypothyroidism, chronic fatigue, kyphoscoliosis -Continues to follow-up with cardiology  Diagnosed with obstructive sleep apnea in 2016 Usually goes to bed about 10 PM to midnight Takes her between 10 and 30 minutes to fall asleep Rarely wakes up in the middle of the night  Final awakening time about 8 AM  Has not been very compliant with CPAP use secondary to mask issues  Was seen in the past by by Dr. Marchelle Gearing for possible interstitial lung disease High-resolution CT scan of the chest was not confirmatory of interstitial lung disease  PFT from 2016 did reveal severe restrictive disease Significant decrease in lung volumes also noted on CT scan of the chest Significant subcutaneous tissue, mediastinal lipomatosis  She has been referred to pulmonary rehab, referred to bariatric surgery as well    Past Medical History:  Diagnosis Date  . Anxiety   . Asthma   . Back pain   . Chronic combined systolic and diastolic CHF (congestive heart failure) (HCC) 07/2011   a. Prior Echo: EF was 25-30%; b. No Sig CAD on CATH; c. No significant finding on Cardiac MRI; d. follow-up Echo 2014 --  EF of 50%;  e. 03/2014 Echo: EF 40-45%, gr 1 DD; f. 11/2014 Echo: EF 40-45%, Gr 2 DD.  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   . Depression   . Dyspnea   . Essential hypertension   . Family history of breast cancer   . Family history of colon cancer   . Fibromyalgia   . Gallbladder problem   . Hypothyroidism   . IBS (irritable bowel syndrome)   . Morbid obesity (HCC)   . Nonischemic cardiomyopathy (HCC)    a. unclear etiology;  b. 01/2012 Cardiac MRI: no infiltrative pathology, EF 52%;  b. 11/2014 Echo: EF 40-45%, gr 2 DD.  [name] Scoliosis   . Sleep apnea   . Stomach problems   . Thyroid disease   . Vitamin B 12 deficiency   . Vitamin D deficiency    Social History   Socioeconomic History  . Marital status: Married    Spouse name: [name] Rieger  . Number of children: 0  . Years of education: Not on file  . Highest education level: Not on file  Occupational History  . Occupation: Programmer, multimedia  Tobacco Use  . Smoking status: Never Smoker  . Smokeless tobacco: Never Used  Vaping Use  . Vaping Use: Never used  Substance and Sexual Activity  . Alcohol use: No  . Drug use: No  . Sexual activity: Not on file  Other Topics Concern  . Not on file  Social History Narrative  . Not on file   Social Determinants of Health   Financial Resource Strain: Medium Risk  . Difficulty of Paying Living Expenses:  Somewhat hard  Food Insecurity: Food Insecurity Present  . Worried About Programme researcher, broadcasting/film/video in the Last Year: Sometimes true  . Ran Out of Food in the Last Year: Never true  Transportation Needs: No Transportation Needs  . Lack of Transportation (Medical): No  . Lack of Transportation (Non-Medical): No  Physical Activity: Inactive  . Days of Exercise per Week: 0 days  . Minutes of Exercise per Session: 0 min  Stress: Stress Concern Present  . Feeling of Stress : Very much  Social Connections: Socially Isolated  . Frequency of Communication with Friends and Family: Twice a week  . Frequency of Social Gatherings with Friends and Family: Never  . Attends Religious Services: Never  . Active Member of Clubs  or Organizations: No  . Attends Banker Meetings: Never  . Marital Status: Married  Catering manager Violence: Not on file   Family History  Problem Relation Age of Onset  . Depression Mother   . Alcoholism Mother   . Hypertension Father   . Heart disease Father   . Colon cancer Maternal Grandmother 21  . Heart attack Maternal Grandmother   . Heart disease Maternal Grandfather   . Heart attack Maternal Grandfather   . Heart disease Paternal Grandmother   . Breast cancer Paternal Grandmother 27       possible bilateral cancer  . Asthma Paternal Grandmother   . Heart attack Paternal Grandmother   . Heart attack Paternal Grandfather   . Breast cancer Maternal Aunt 50  . Breast cancer Paternal Aunt 40  . Stroke Neg Hx   . Thyroid disease Neg Hx        Review of Systems  Constitutional: Negative for fever.  Respiratory: Positive for shortness of breath. Negative for cough.   Genitourinary: Negative for dysuria.  Skin: Negative for rash.  Allergic/Immunologic: Negative.   Psychiatric/Behavioral: Negative for dysphoric mood.       Objective:   Physical Exam Constitutional:      General: She is not in acute distress.    Appearance: She is obese.  HENT:     Head: Normocephalic and atraumatic.  Eyes:     Pupils: Pupils are equal, round, and reactive to light.  Cardiovascular:     Rate and Rhythm: Normal rate.     Heart sounds: No murmur heard.   Pulmonary:     Effort: Pulmonary effort is normal. No respiratory distress.     Breath sounds: Normal breath sounds. No stridor. No wheezing or rhonchi.  Musculoskeletal:     Cervical back: No rigidity.  Skin:    Coloration: Skin is not pale.  Neurological:     Mental Status: She is alert.  Psychiatric:        Mood and Affect: Mood normal.        Behavior: Behavior normal.    There were no vitals filed for this visit. Results of the Epworth flowsheet 02/25/2019 06/10/2018  Sitting and reading 2 1   Watching TV 2 1  Sitting, inactive in a public place (e.g. a theatre or a meeting) 0 0  As a passenger in a car for an hour without a break 3 3  Lying down to rest in the afternoon when circumstances permit 3 3  Sitting and talking to someone 0 0  Sitting quietly after a lunch without alcohol 1 1  In a car, while stopped for a few minutes in traffic 0 0  Total score 11 9  CT scan of the chest reviewed-10/01/2018-reviewed by myself with the patient, previous CT 9[address]46 [id number] also reviewed, 10/10/2016 reviewed  Pulmonary function study from 08/25/2014 reviewed showing severe restriction PFT from 04/28/2019 with severe restrictive disease  Echocardiogram from 03/11/2018 reviewed showing normal systolic function  ABG in the past with hypercapnia  CPAP compliance remains poor although working on addressing this    Assessment & Plan:  .  Obstructive sleep apnea .  Obesity hypoventilation .  Cardiomyopathy, systolic and diastolic heart failure .  Restrictive lung disease .  Morbid obesity .  Hypothyroidism .  Severe kyphoscoliosis   Plan: -Encourage weight loss efforts Graded exercise as tolerated  Encouraged continued CPAP use  Follow-up in 6 months  Encouraged to call with any significant concerns  We will continue Symbicort and albuterol

## 2020-08-14 IMAGING — CT CT ANGIO CHEST
2 of 9 series · 17 of 46 positions shown · IV contrast (iopamidol)
Comparison: Chest CT 10/10/2016

CLINICAL DATA: Shortness of breath since this morning. History of
CHF.

EXAM:
CT ANGIOGRAPHY CHEST WITH CONTRAST
TECHNIQUE: Multidetector CT imaging of the chest was performed using the
standard protocol during bolus administration of intravenous
contrast. Multiplanar CT image reconstructions and MIPs were
obtained to evaluate the vascular anatomy.
CONTRAST:  65 cc [id number] IOPAMIDOL ([id number]) INJECTION 76%

[Series 8: thins · axial · 0.82mm/px · z∈[+1140,+1332]mm · 14 of 216 slices shown]
[im 12/216  lung]
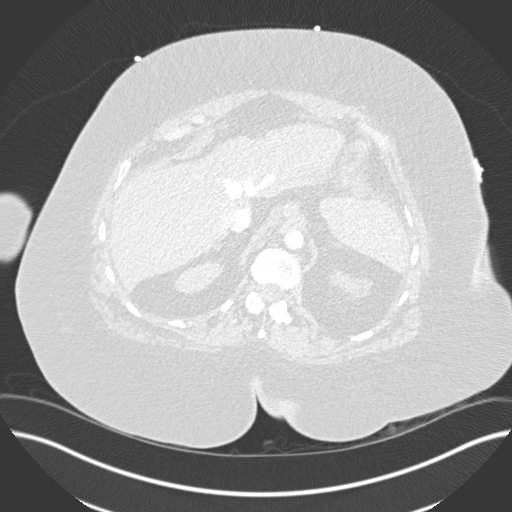
[im 24/216  soft-tissue]
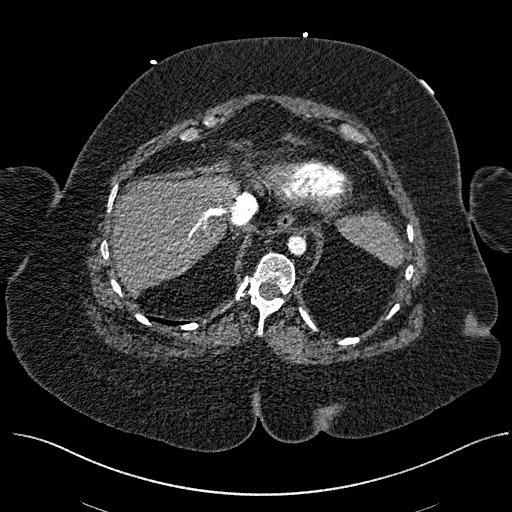
[im 48/216  lung]
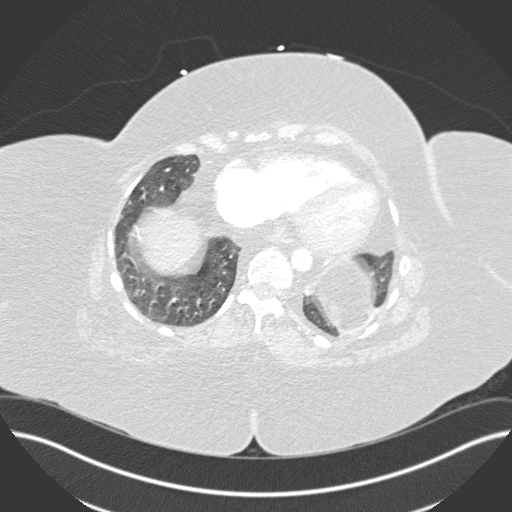
[im 60/216  soft-tissue]
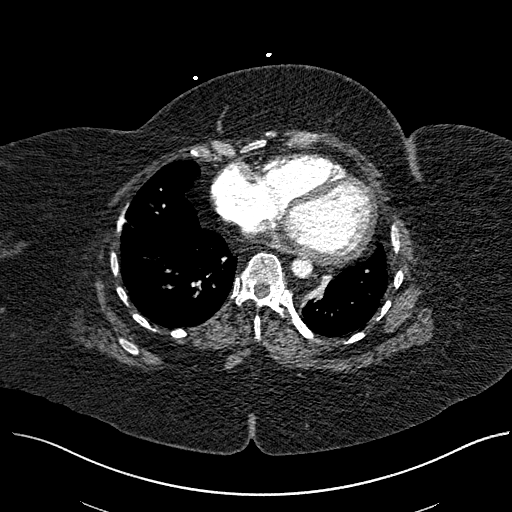
[im 72/216  lung]
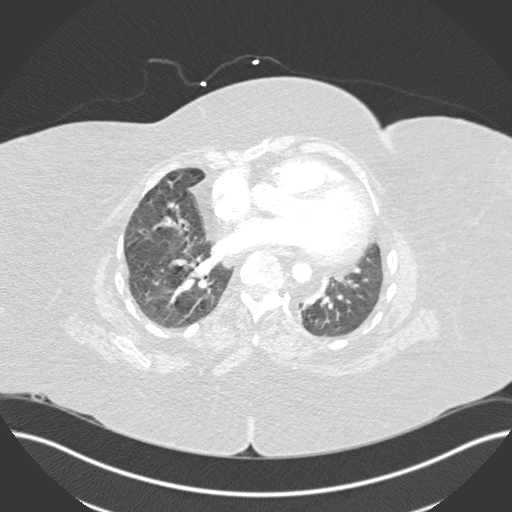
[im 84/216  soft-tissue]
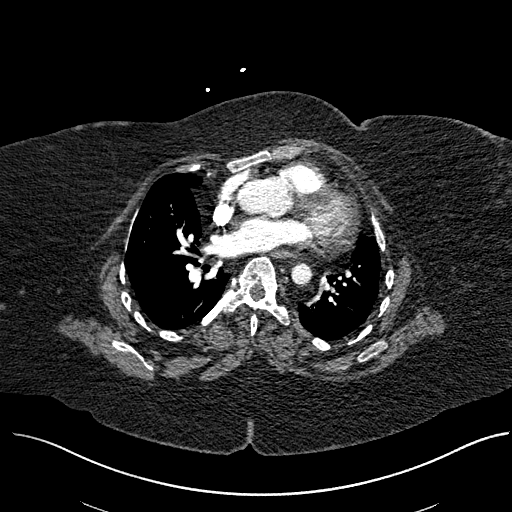
[im 96/216  lung]
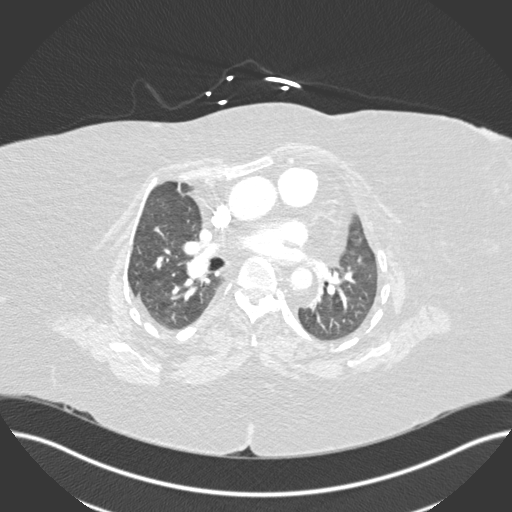
[im 120/216  soft-tissue]
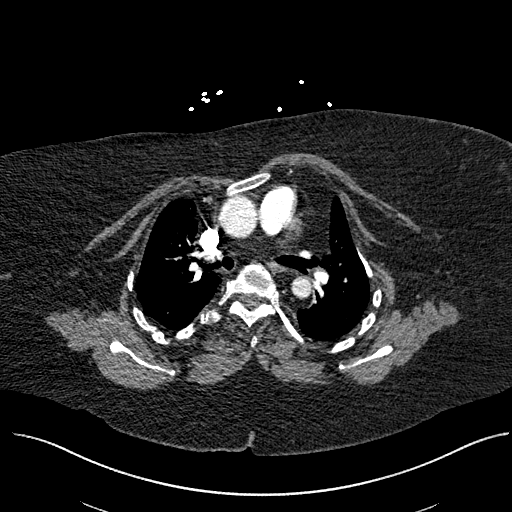
[im 132/216  lung]
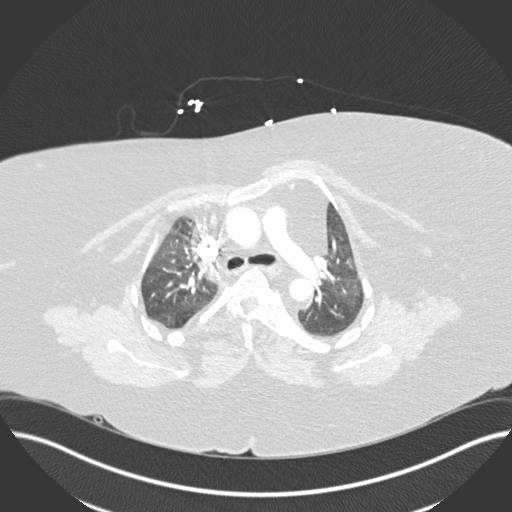
[im 144/216  soft-tissue]
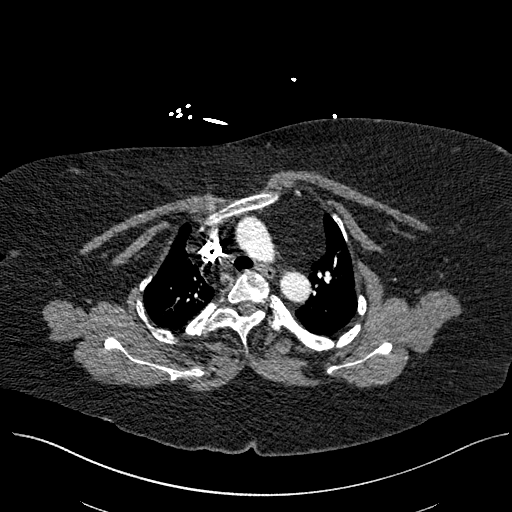
[im 156/216  lung]
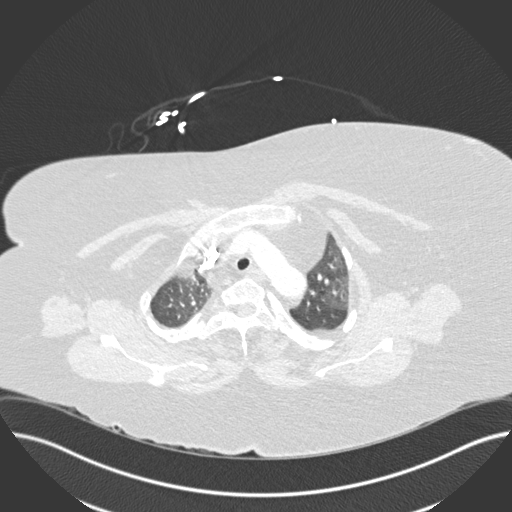
[im 168/216  soft-tissue]
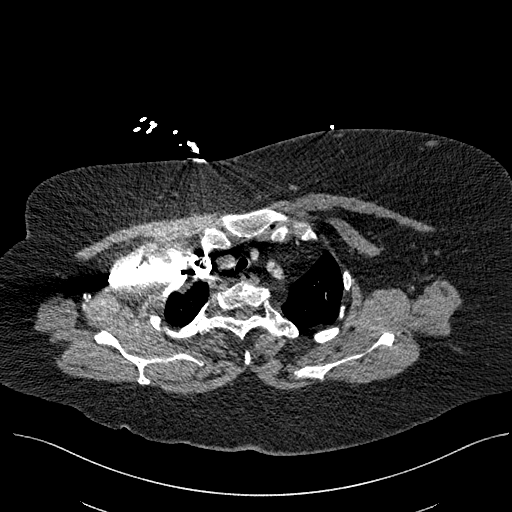
[im 192/216  lung]
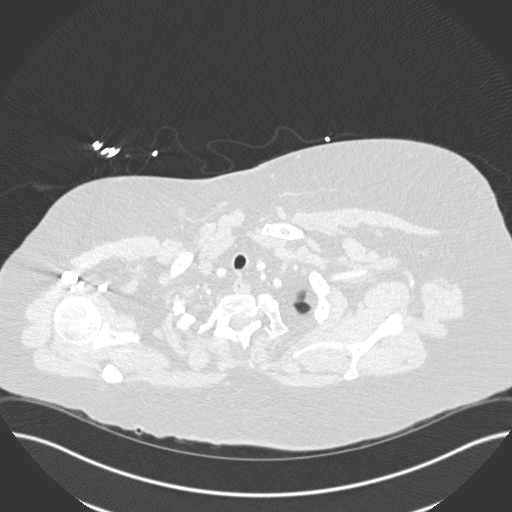
[im 204/216  soft-tissue]
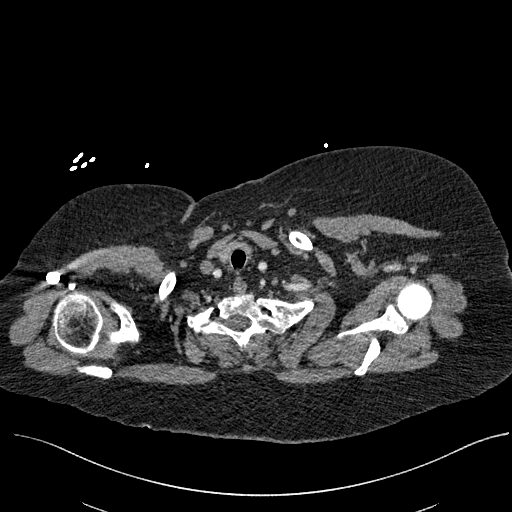

[Series 10: coronal mpr · coronal · 0.45mm/px · 3 of 151 slices shown]
[im 38/151  soft-tissue]
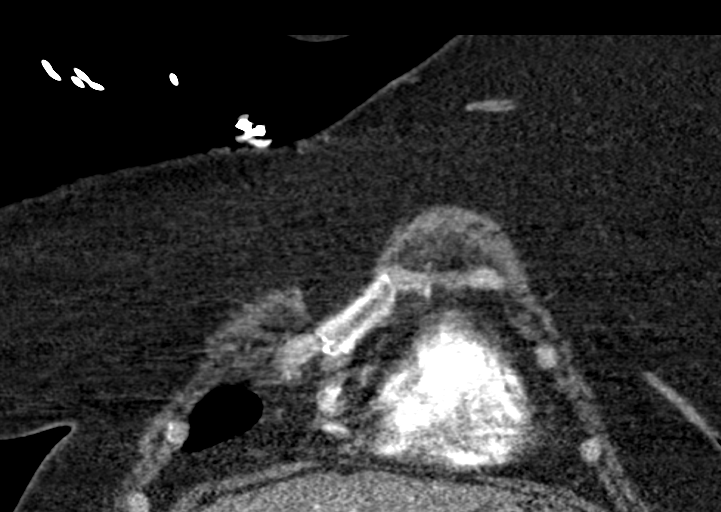
[im 76/151  soft-tissue]
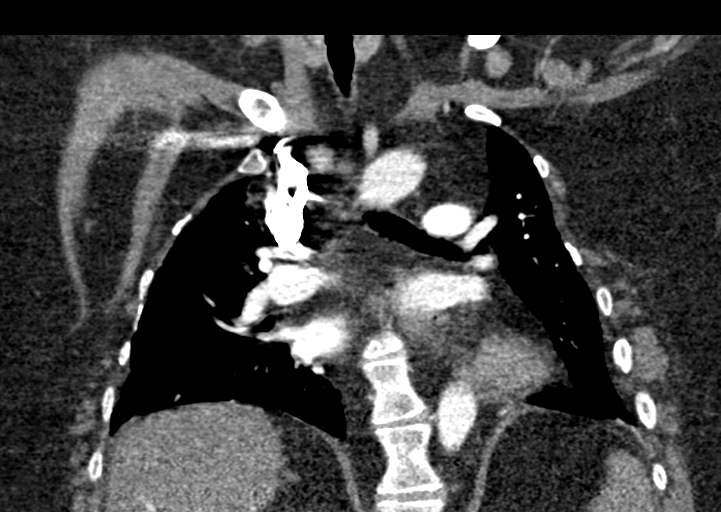
[im 113/151  soft-tissue]
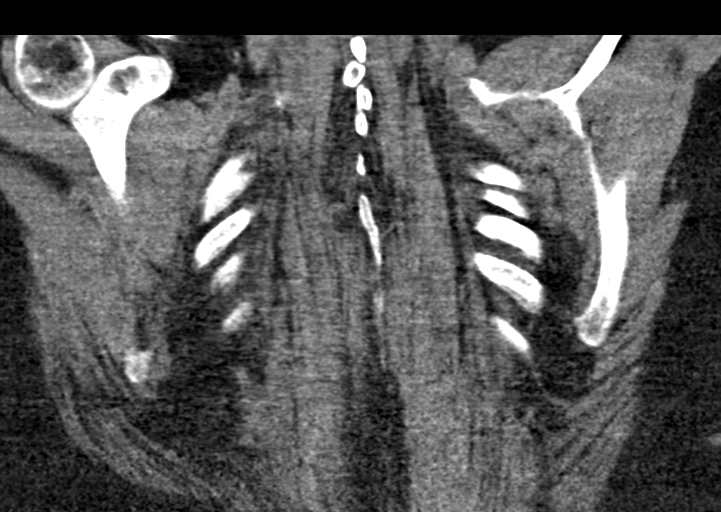

[17 of 46 positions shown; findings below may reference images not displayed]

FINDINGS: Cardiovascular: Stable cardiac enlargement. No pericardial effusion.
Prominent pericardial and epicardial fat. The aorta is normal in
caliber. No dissection. The branch vessels are patent. Scattered
coronary artery calcifications, advanced for age.

The pulmonary arterial tree is well opacified. No filling defects to
suggest pulmonary embolism.

Mediastinum/Nodes: No mediastinal or hilar mass or adenopathy. The
esophagus is grossly normal.

Lungs/Pleura: Patchy ground-glass opacity could suggest edema or
inflammation. No focal airspace consolidation or pleural effusion.

Upper Abdomen: Reflux of contrast is noted down the IVC and into the
hepatic veins. This could be due to right heart failure or tricuspid
regurgitation.

Musculoskeletal: No significant findings. Stable lower cervical and
upper thoracic vertebral body anomalies with multilevel fusions
(Jim anomaly).

Review of the MIP images confirms the above findings.
IMPRESSION: 1. No CT findings for pulmonary embolism.
2. No aortic aneurysm or dissection.
3. Stable cardiac enlargement.
4. Reflux of contrast down the IVC and into the hepatic veins
suggesting right heart failure or tricuspid regurgitation.
5. Patchy ground-glass opacity in the lungs is likely edema or
inflammation. No pleural effusions or focal infiltrates.
6. Chronic spinal anomalies and associated scoliosis.

Aortic Atherosclerosis ([id number]).

## 2020-09-26 ENCOUNTER — Other Ambulatory Visit: Payer: Self-pay | Admitting: Pulmonary Disease

## 2020-10-20 ENCOUNTER — Ambulatory Visit (INDEPENDENT_AMBULATORY_CARE_PROVIDER_SITE_OTHER): Payer: Commercial Managed Care - PPO | Admitting: Internal Medicine

## 2020-10-20 ENCOUNTER — Other Ambulatory Visit: Payer: Self-pay

## 2020-10-20 ENCOUNTER — Telehealth: Payer: Self-pay

## 2020-10-20 ENCOUNTER — Encounter: Payer: Self-pay | Admitting: Internal Medicine

## 2020-10-20 VITALS — BP 110/78 | HR 108 | Temp 99.2°F | Ht 60.0 in | Wt 218.0 lb

## 2020-10-20 DIAGNOSIS — R7301 Impaired fasting glucose: Secondary | ICD-10-CM | POA: Diagnosis not present

## 2020-10-20 DIAGNOSIS — I10 Essential (primary) hypertension: Secondary | ICD-10-CM | POA: Diagnosis not present

## 2020-10-20 DIAGNOSIS — E559 Vitamin D deficiency, unspecified: Secondary | ICD-10-CM

## 2020-10-20 DIAGNOSIS — D72829 Elevated white blood cell count, unspecified: Secondary | ICD-10-CM

## 2020-10-20 DIAGNOSIS — E538 Deficiency of other specified B group vitamins: Secondary | ICD-10-CM | POA: Diagnosis not present

## 2020-10-20 DIAGNOSIS — E039 Hypothyroidism, unspecified: Secondary | ICD-10-CM

## 2020-10-20 DIAGNOSIS — Z0001 Encounter for general adult medical examination with abnormal findings: Secondary | ICD-10-CM | POA: Diagnosis not present

## 2020-10-20 LAB — COMPREHENSIVE METABOLIC PANEL
ALT: 17 U/L (ref 0–35)
AST: 17 U/L (ref 0–37)
Albumin: 4.3 g/dL (ref 3.5–5.2)
Alkaline Phosphatase: 104 U/L (ref 39–117)
BUN: 20 mg/dL (ref 6–23)
CO2: 36 mEq/L — ABNORMAL HIGH (ref 19–32)
Calcium: 9.2 mg/dL (ref 8.4–10.5)
Chloride: 90 mEq/L — ABNORMAL LOW (ref 96–112)
Creatinine, Ser: 0.98 mg/dL (ref 0.40–1.20)
GFR: 72.1 mL/min (ref >60.00)
Glucose, Bld: 108 mg/dL — ABNORMAL HIGH (ref 70–99)
Potassium: 3.1 mEq/L — ABNORMAL LOW (ref 3.5–5.1)
Sodium: 136 mEq/L (ref 135–145)
Total Bilirubin: 0.4 mg/dL (ref 0.2–1.2)
Total Protein: 8.6 g/dL — ABNORMAL HIGH (ref 6.0–8.3)

## 2020-10-20 LAB — CBC
HCT: 41.1 % (ref 36.0–46.0)
Hemoglobin: 13.9 g/dL (ref 12.0–15.0)
MCHC: 33.9 g/dL (ref 30.0–36.0)
MCV: 88.2 fl (ref 78.0–100.0)
Platelets: 386 10*3/uL (ref 150.0–400.0)
RBC: 4.66 Mil/uL (ref 3.87–5.11)
RDW: 13.5 % (ref 11.5–15.5)
WBC: 19.9 10*3/uL (ref 4.0–10.5)

## 2020-10-20 LAB — LIPID PANEL
Cholesterol: 188 mg/dL (ref 0–200)
HDL: 50.9 mg/dL (ref >39.00)
LDL Cholesterol: 104 mg/dL — ABNORMAL HIGH (ref 0–99)
NonHDL: 136.94
Total CHOL/HDL Ratio: 4
Triglycerides: 163 mg/dL — ABNORMAL HIGH (ref 0.0–149.0)
VLDL: 32.6 mg/dL (ref 0.0–40.0)

## 2020-10-20 LAB — HEMOGLOBIN A1C: Hgb A1c MFr Bld: 6.1 % (ref 4.6–6.5)

## 2020-10-20 LAB — TSH: TSH: 3.83 u[IU]/mL (ref 0.35–4.50)

## 2020-10-20 LAB — VITAMIN B12: Vitamin B-12: 260 pg/mL (ref 211–911)

## 2020-10-20 LAB — VITAMIN D 25 HYDROXY (VIT D DEFICIENCY, FRACTURES): VITD: 13.85 ng/mL — ABNORMAL LOW (ref 30.00–100.00)

## 2020-10-20 MED ORDER — NALTREXONE HCL 50 MG PO TABS: 50.0000 mg | ORAL_TABLET | Freq: Every day | ORAL | 1 refills | Status: DC

## 2020-10-20 MED ORDER — VITAMIN D (ERGOCALCIFEROL) 1.25 MG (50000 UNIT) PO CAPS: 50000.0000 [IU] | ORAL_CAPSULE | ORAL | 3 refills | Status: DC

## 2020-10-20 NOTE — Telephone Encounter (Signed)
Will address via result note, no action needed.

## 2020-10-20 NOTE — Progress Notes (Signed)
   Subjective:   Patient ID: Meredith Diaz, female    DOB: [birth year], 40 y.o.   MRN: [id number]  HPI The patient is a 40 YO female coming in for physical.   PMH, FMH, social history reviewed and updated  Review of Systems  Constitutional:  Positive for activity change and fatigue.  HENT: Negative.    Eyes: Negative.   Respiratory:  Positive for shortness of breath. Negative for cough and chest tightness.   Cardiovascular:  Negative for chest pain, palpitations and leg swelling.  Gastrointestinal:  Negative for abdominal distention, abdominal pain, constipation, diarrhea, nausea and vomiting.  Musculoskeletal:  Positive for arthralgias and back pain.  Skin: Negative.   Neurological: Negative.   Psychiatric/Behavioral: Negative.     Objective:  Physical Exam Constitutional:      Appearance: She is well-developed. She is obese.  HENT:     Head: Normocephalic and atraumatic.  Cardiovascular:     Rate and Rhythm: Normal rate and regular rhythm.  Pulmonary:     Effort: Pulmonary effort is normal. No respiratory distress.     Breath sounds: Normal breath sounds. No wheezing or rales.  Abdominal:     General: Bowel sounds are normal. There is no distension.     Palpations: Abdomen is soft.     Tenderness: There is no abdominal tenderness. There is no rebound.  Musculoskeletal:        General: Tenderness present.     Cervical back: Normal range of motion.  Skin:    General: Skin is warm and dry.  Neurological:     Mental Status: She is alert and oriented to person, place, and time.     Coordination: Coordination normal.    Vitals:   10/20/20 1449  BP: 110/78  Pulse: (!) 108  Temp: 99.2 F (37.3 C)  TempSrc: Oral  SpO2: 93%  Weight: 218 lb (98.9 kg)  Height: 5' (1.524 m)    This visit occurred during the SARS-CoV-2 public health emergency.  Safety protocols were in place, including screening questions prior to the visit, additional usage of staff PPE, and  extensive cleaning of exam room while observing appropriate contact time as indicated for disinfecting solutions.   Assessment & Plan:

## 2020-10-20 NOTE — Telephone Encounter (Signed)
CRITICAL VALUE STICKER  CRITICAL VALUE: 19.9 WBC  RECEIVER (on-site recipient of call): Ladona Ridgel A. Clovis Riley, CMA  DATE & TIME NOTIFIED: 4:40p  MESSENGER (representative from lab): Clydie Braun  MD NOTIFIED: Yes  TIME OF NOTIFICATION: 4:40p  RESPONSE: waiting for provider response

## 2020-10-20 NOTE — Patient Instructions (Signed)
I have sent in the naltrexone to take daily to see if this can help with the food cravings and weight loss.

## 2020-10-21 NOTE — Assessment & Plan Note (Signed)
Flu shot yearly. Covid-19 3 shots advised she could get 4th. Tetanus up to date. Mammogram with gyn, pap smear with gyn. Counseled about sun safety and mole surveillance. Counseled about the dangers of distracted driving. Given 10 year screening recommendations.

## 2020-10-21 NOTE — Assessment & Plan Note (Signed)
Checking B12 level.  °

## 2020-10-21 NOTE — Assessment & Plan Note (Signed)
Checking CBC.  °

## 2020-10-21 NOTE — Assessment & Plan Note (Signed)
Checking TSH and adjust synthroid 112 mcg daily as needed.  ?

## 2020-10-21 NOTE — Assessment & Plan Note (Signed)
Checking HgA1c. 

## 2020-10-21 NOTE — Assessment & Plan Note (Signed)
Rx naltrexone to add to her wellbutrin to see if we can help with food cravings. Weight is down from last year but she is not able to lose weight like she would like.

## 2020-10-21 NOTE — Assessment & Plan Note (Signed)
On metoprolol and spironolactone and valsartan and torsemide. Checking CMP and adjust as needed. BP at goal.

## 2020-10-24 ENCOUNTER — Other Ambulatory Visit: Payer: Self-pay | Admitting: Internal Medicine

## 2020-10-24 DIAGNOSIS — D72829 Elevated white blood cell count, unspecified: Secondary | ICD-10-CM

## 2020-11-01 ENCOUNTER — Other Ambulatory Visit: Payer: Self-pay | Admitting: Pulmonary Disease

## 2020-11-01 ENCOUNTER — Other Ambulatory Visit: Payer: Self-pay | Admitting: Internal Medicine

## 2020-11-02 DIAGNOSIS — F418 Other specified anxiety disorders: Secondary | ICD-10-CM | POA: Insufficient documentation

## 2020-11-02 DIAGNOSIS — I509 Heart failure, unspecified: Secondary | ICD-10-CM | POA: Insufficient documentation

## 2020-11-25 ENCOUNTER — Other Ambulatory Visit: Payer: Self-pay | Admitting: Internal Medicine

## 2020-11-30 ENCOUNTER — Other Ambulatory Visit (INDEPENDENT_AMBULATORY_CARE_PROVIDER_SITE_OTHER): Payer: Commercial Managed Care - PPO

## 2020-11-30 ENCOUNTER — Other Ambulatory Visit: Payer: Self-pay

## 2020-11-30 DIAGNOSIS — D72829 Elevated white blood cell count, unspecified: Secondary | ICD-10-CM | POA: Diagnosis not present

## 2020-11-30 LAB — CBC WITH DIFFERENTIAL/PLATELET
Basophils Absolute: 0.1 10*3/uL (ref 0.0–0.1)
Basophils Relative: 0.7 % (ref 0.0–3.0)
Eosinophils Absolute: 0.3 10*3/uL (ref 0.0–0.7)
Eosinophils Relative: 2 % (ref 0.0–5.0)
HCT: 40 % (ref 36.0–46.0)
Hemoglobin: 13.3 g/dL (ref 12.0–15.0)
Lymphocytes Relative: 20.1 % (ref 12.0–46.0)
Lymphs Abs: 2.9 10*3/uL (ref 0.7–4.0)
MCHC: 33.3 g/dL (ref 30.0–36.0)
MCV: 90.3 fl (ref 78.0–100.0)
Monocytes Absolute: 1.2 10*3/uL — ABNORMAL HIGH (ref 0.1–1.0)
Monocytes Relative: 8.4 % (ref 3.0–12.0)
Neutro Abs: 10 10*3/uL — ABNORMAL HIGH (ref 1.4–7.7)
Neutrophils Relative %: 68.8 % (ref 43.0–77.0)
Platelets: 344 10*3/uL (ref 150.0–400.0)
RBC: 4.42 Mil/uL (ref 3.87–5.11)
RDW: 13.6 % (ref 11.5–15.5)
WBC: 14.5 10*3/uL — ABNORMAL HIGH (ref 4.0–10.5)

## 2020-12-01 LAB — PATHOLOGIST SMEAR REVIEW

## 2021-01-20 ENCOUNTER — Ambulatory Visit (INDEPENDENT_AMBULATORY_CARE_PROVIDER_SITE_OTHER): Payer: Commercial Managed Care - PPO | Admitting: Internal Medicine

## 2021-01-20 ENCOUNTER — Other Ambulatory Visit: Payer: Self-pay

## 2021-01-20 ENCOUNTER — Encounter: Payer: Self-pay | Admitting: Internal Medicine

## 2021-01-20 VITALS — BP 128/90 | HR 105 | Temp 98.3°F | Ht 60.0 in | Wt 225.6 lb

## 2021-01-20 DIAGNOSIS — R7301 Impaired fasting glucose: Secondary | ICD-10-CM

## 2021-01-20 DIAGNOSIS — R7303 Prediabetes: Secondary | ICD-10-CM | POA: Diagnosis not present

## 2021-01-20 LAB — POCT GLYCOSYLATED HEMOGLOBIN (HGB A1C): Hemoglobin A1C: 6 % — AB (ref 4.0–5.6)

## 2021-01-20 NOTE — Assessment & Plan Note (Signed)
POC HgA1c is 6.0 at visit today which is stable.

## 2021-01-20 NOTE — Progress Notes (Signed)
    Subjective:   Patient ID: Meredith Diaz, female    DOB: 05-25-1980, 40 y.o.   MRN: [id number]  HPI The patient is a 40 YO female coming in for follow up medical conditions. Naltrexone is not helping much with weight, unclear if helping with appetite.   Review of Systems  Constitutional:  Positive for fatigue and unexpected weight change.  HENT: Negative.    Eyes: Negative.   Respiratory:  Positive for shortness of breath. Negative for cough and chest tightness.   Cardiovascular:  Negative for chest pain, palpitations and leg swelling.  Gastrointestinal:  Negative for abdominal distention, abdominal pain, constipation, diarrhea, nausea and vomiting.  Musculoskeletal: Negative.   Skin: Negative.   Neurological: Negative.   Psychiatric/Behavioral: Negative.     Objective:  Physical Exam Constitutional:      Appearance: She is well-developed. She is obese.  HENT:     Head: Normocephalic and atraumatic.  Cardiovascular:     Rate and Rhythm: Normal rate and regular rhythm.  Pulmonary:     Effort: Pulmonary effort is normal. No respiratory distress.     Breath sounds: Normal breath sounds. No wheezing or rales.  Abdominal:     General: Bowel sounds are normal. There is no distension.     Palpations: Abdomen is soft.     Tenderness: There is no abdominal tenderness. There is no rebound.  Musculoskeletal:     Cervical back: Normal range of motion.  Skin:    General: Skin is warm and dry.  Neurological:     Mental Status: She is alert and oriented to person, place, and time.     Coordination: Coordination normal.    Vitals:   01/20/21 0905  BP: 128/90  Pulse: (!) 105  Temp: 98.3 F (36.8 C)  TempSrc: Oral  SpO2: 90%  Weight: 225 lb 9.6 oz (102.3 kg)  Height: 5' (1.524 m)   This visit occurred during the SARS-CoV-2 public health emergency.  Safety protocols were in place, including screening questions prior to the visit, additional usage of staff PPE, and  extensive cleaning of exam room while observing appropriate contact time as indicated for disinfecting solutions.   Assessment & Plan:

## 2021-01-20 NOTE — Patient Instructions (Signed)
The options for weight loss are saxenda and wegovy for the injections. The oral options are contrave and qsymia.

## 2021-01-20 NOTE — Assessment & Plan Note (Signed)
We will D/C naltrexone as it is not working. We talked about other options including wegoxy, saxenda, qsymia.

## 2021-02-03 ENCOUNTER — Other Ambulatory Visit: Payer: Self-pay | Admitting: Pulmonary Disease

## 2021-02-15 ENCOUNTER — Ambulatory Visit (INDEPENDENT_AMBULATORY_CARE_PROVIDER_SITE_OTHER): Payer: Commercial Managed Care - PPO

## 2021-02-15 ENCOUNTER — Other Ambulatory Visit: Payer: Self-pay

## 2021-02-15 DIAGNOSIS — Z23 Encounter for immunization: Secondary | ICD-10-CM

## 2021-02-20 ENCOUNTER — Ambulatory Visit (INDEPENDENT_AMBULATORY_CARE_PROVIDER_SITE_OTHER): Payer: Commercial Managed Care - PPO | Admitting: Pulmonary Disease

## 2021-02-20 ENCOUNTER — Encounter: Payer: Self-pay | Admitting: Pulmonary Disease

## 2021-02-20 ENCOUNTER — Other Ambulatory Visit: Payer: Self-pay

## 2021-02-20 VITALS — BP 104/62 | HR 100 | Temp 97.8°F | Ht 60.0 in | Wt 226.0 lb

## 2021-02-20 DIAGNOSIS — G4733 Obstructive sleep apnea (adult) (pediatric): Secondary | ICD-10-CM | POA: Diagnosis not present

## 2021-02-20 DIAGNOSIS — Z9989 Dependence on other enabling machines and devices: Secondary | ICD-10-CM

## 2021-02-20 MED ORDER — ALBUTEROL SULFATE HFA 108 (90 BASE) MCG/ACT IN AERS: 1.0000 | INHALATION_SPRAY | Freq: Four times a day (QID) | RESPIRATORY_TRACT | 3 refills | Status: DC | PRN

## 2021-02-20 NOTE — Addendum Note (Signed)
Addended by: Arvilla Market on: 02/20/2021 11:24 AM   Modules accepted: Orders

## 2021-02-20 NOTE — Patient Instructions (Signed)
DME referral for new mask  Call with any significant concerns  Continue Symbicort regularly Albuterol as needed  I will see you back in 6 months

## 2021-02-20 NOTE — Progress Notes (Signed)
Subjective:    Patient ID: Meredith Diaz, female    DOB: 11/13/1980, 40 y.o.   MRN: [id number]  Patient with a history of obstructive sleep apnea  Continues to use CPAP -Download not available at present -Needs a new mask  Breathing is about the same No changes in her health since her last visit  No recent exacerbation Continues to use Symbicort on a regular basis Albuterol use as needed   Multiple comorbidities including systolic and diastolic congestive heart failure, hypothyroidism, chronic fatigue, kyphoscoliosis -Continues to follow-up with cardiology  Diagnosed with obstructive sleep apnea in 2016 Usually goes to bed about 10 PM to midnight Takes her between 10 and 30 minutes to fall asleep Rarely wakes up in the middle of the night  Final awakening time about 8 AM  Was seen in the past by by Dr. Marchelle Gearing for possible interstitial lung disease High-resolution CT scan of the chest was not confirmatory of interstitial lung disease  PFT from 2016 did reveal severe restrictive disease Significant decrease in lung volumes also noted on CT scan of the chest Significant subcutaneous tissue, mediastinal lipomatosis  She has been referred to pulmonary rehab, referred to bariatric surgery as well    Past Medical History:  Diagnosis Date   Anxiety    Asthma    Back pain    Chronic combined systolic and diastolic CHF (congestive heart failure) (HCC) 07/2011   a. Prior Echo: EF was 25-30%; b. No Sig CAD on CATH; c. No significant finding on Cardiac MRI; d. follow-up Echo 2014 --  EF of 50%;  e. 03/2014 Echo: EF 40-45%, gr 1 DD; f. 11/2014 Echo: EF 40-45%, Gr 2 DD.   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   Depression    Dyspnea    Essential hypertension    Family history of breast cancer    Family history of  colon cancer    Fibromyalgia    Gallbladder problem    Hypothyroidism    IBS (irritable bowel syndrome)    Morbid obesity (HCC)    Nonischemic cardiomyopathy (HCC)    a. unclear etiology;  b. 01/2012 Cardiac MRI: no infiltrative pathology, EF 52%;  b. 11/2014 Echo: EF 40-45%, gr 2 DD.   Scoliosis    Sleep apnea    Stomach problems    Thyroid disease    Vitamin B 12 deficiency    Vitamin D deficiency    Social History   Socioeconomic History   Marital status: Married    Spouse name: [name] Rieger   Number of children: 0   Years of education: Not on file   Highest education level: Not on file  Occupational History   Occupation: Programmer, multimedia  Tobacco Use   Smoking status: Never   Smokeless tobacco: Never  Vaping Use   Vaping Use: Never used  Substance and Sexual Activity   Alcohol use: No   Drug use: No   Sexual activity: Not on file  Other Topics Concern   Not on file  Social History Narrative   Not on file   Social Determinants of Health   Financial Resource Strain: Not on file  Food Insecurity: Not on file  Transportation Needs: Not on file  Physical Activity: Not on file  Stress: Not on file  Social Connections: Not on file  Intimate  Partner Violence: Not on file   Family History  Problem Relation Age of Onset   Depression Mother    Alcoholism Mother    Hypertension Father    Heart disease Father    Colon cancer Maternal Grandmother 45   Heart attack Maternal Grandmother    Heart disease Maternal Grandfather    Heart attack Maternal Grandfather    Heart disease Paternal Grandmother    Breast cancer Paternal Grandmother 63       possible bilateral cancer   Asthma Paternal Grandmother    Heart attack Paternal Grandmother    Heart attack Paternal Grandfather    Breast cancer Maternal Aunt 50   Breast cancer Paternal Aunt 50   Stroke Neg Hx    Thyroid disease Neg Hx        Review of Systems  Constitutional:  Positive for fatigue. Negative for fever.   Respiratory:  Positive for shortness of breath. Negative for cough.   Genitourinary:  Negative for dysuria.  Skin:  Negative for rash.  Allergic/Immunologic: Negative.   Psychiatric/Behavioral:  Negative for dysphoric mood.       Objective:   Physical Exam Constitutional:      General: She is not in acute distress.    Appearance: She is obese.  HENT:     Head: Normocephalic and atraumatic.  Eyes:     Pupils: Pupils are equal, round, and reactive to light.  Cardiovascular:     Heart sounds: No murmur heard.   No friction rub.  Pulmonary:     Effort: Pulmonary effort is normal. No respiratory distress.     Breath sounds: Normal breath sounds. No stridor. No wheezing or rhonchi.  Musculoskeletal:     Cervical back: No rigidity or tenderness.  Skin:    Coloration: Skin is not pale.  Neurological:     Mental Status: She is alert.  Psychiatric:        Mood and Affect: Mood normal.        Behavior: Behavior normal.   Vitals:   02/20/21 1034  BP: 104/62  Pulse: 100  Temp: 97.8 F (36.6 C)  SpO2: 92%   Results of the Epworth flowsheet 02/25/2019 06/10/2018  Sitting and reading 2 1  Watching TV 2 1  Sitting, inactive in a public place (e.g. a theatre or a meeting) 0 0  As a passenger in a car for an hour without a break 3 3  Lying down to rest in the afternoon when circumstances permit 3 3  Sitting and talking to someone 0 0  Sitting quietly after a lunch without alcohol 1 1  In a car, while stopped for a few minutes in traffic 0 0  Total score 11 9   CT scan of the chest reviewed-10/01/2018-reviewed by myself with the patient, previous CT [id number] 19 also reviewed, 10/10/2016 reviewed  Pulmonary function study from 08/25/2014 reviewed showing severe restriction PFT from 04/28/2019 with severe restrictive disease  Echocardiogram from 03/11/2018 reviewed showing normal systolic function  ABG in the past with hypercapnia  CPAP compliance not available at present     Assessment & Plan:  .  Obstructive sleep apnea .  Obesity hypoventilation .  Cardiomyopathy, systolic and diastolic heart failure .  Restrictive lung disease .  Hypothyroidism .  Severe kyphoscoliosis  Will contact DME company for new mask  Graded exercise as tolerated  Continue oxygen supplementation  Follow-up in 6 months  Continue Symbicort and albuterol as needed

## 2021-03-21 ENCOUNTER — Other Ambulatory Visit (HOSPITAL_COMMUNITY): Payer: Self-pay | Admitting: Cardiology

## 2021-04-18 ENCOUNTER — Other Ambulatory Visit (HOSPITAL_COMMUNITY): Payer: Self-pay | Admitting: Cardiology

## 2021-04-22 ENCOUNTER — Other Ambulatory Visit (HOSPITAL_COMMUNITY): Payer: Self-pay | Admitting: Cardiology

## 2021-04-22 DIAGNOSIS — I5042 Chronic combined systolic (congestive) and diastolic (congestive) heart failure: Secondary | ICD-10-CM

## 2021-05-16 ENCOUNTER — Other Ambulatory Visit: Payer: Self-pay | Admitting: Pulmonary Disease

## 2021-05-18 ENCOUNTER — Other Ambulatory Visit (HOSPITAL_COMMUNITY): Payer: Self-pay | Admitting: Cardiology

## 2021-05-18 ENCOUNTER — Telehealth (HOSPITAL_COMMUNITY): Payer: Self-pay | Admitting: Vascular Surgery

## 2021-05-18 DIAGNOSIS — I5042 Chronic combined systolic (congestive) and diastolic (congestive) heart failure: Secondary | ICD-10-CM

## 2021-05-18 NOTE — Telephone Encounter (Signed)
Called pt to schedule appt w/ NP/PA , vm full

## 2021-06-05 ENCOUNTER — Other Ambulatory Visit (HOSPITAL_COMMUNITY): Payer: Self-pay | Admitting: Cardiology

## 2021-06-05 DIAGNOSIS — I5042 Chronic combined systolic (congestive) and diastolic (congestive) heart failure: Secondary | ICD-10-CM

## 2021-06-09 ENCOUNTER — Other Ambulatory Visit: Payer: Self-pay | Admitting: Pulmonary Disease

## 2021-06-20 ENCOUNTER — Other Ambulatory Visit (HOSPITAL_COMMUNITY): Payer: Self-pay | Admitting: Cardiology

## 2021-06-21 ENCOUNTER — Other Ambulatory Visit (HOSPITAL_COMMUNITY): Payer: Self-pay | Admitting: Cardiology

## 2021-06-21 DIAGNOSIS — I5042 Chronic combined systolic (congestive) and diastolic (congestive) heart failure: Secondary | ICD-10-CM

## 2021-06-28 NOTE — Progress Notes (Signed)
Date:  07/03/2021   ID:  [name], DOB 1981-02-29, MRN [id number]  Provider location: [facility] Type of Visit: Established patient   PCP:  Myrlene Broker, MD  HF Cardiologist:  Dr. Shirlee Latch  Chief Complaint: HF follow up   History of Present Illness: Meredith Diaz is a 41 y.o. female who has a history of nonischemic cardiomyopathy.  This was diagnosed in 2013.  EF was initially 25-30%. Cardiac cath at that time showed no significant CAD.  Cardiac MRI showed no late gadoliniuim enhancement.  Since that time, EF has remained low but has improved a bit.  Most recent echo in 11/17 showed EF 45-50% with diffuse hypokinesis.  She also has asthma.  PFTs showed a severe restrictive defect that may be due to body habitus primarily.    She was admitted in 8/16 with chest pain.  CTA chest showed no PE, lung parenchyma looked normal.  She had ETT with poor exercise tolerance but no evidence for ischemia.  CPX was done in 12/16, showing moderate functional limitation due primarily to lung restriction.    Admitted 11/4 - 03/13/18 for increased DOE. Diuresed well with IV lasix and treated for asthma exacerbation. Treated with solumedrol and prednisone burst. Echo with EF 50-55%, mildly dilated/dysfunctional RV, septal bounce noted but IVC normal in size.    Due to increased dyspnea, RHC was done in 3/20.  Right and left heart filling pressures were elevated with near equalization of pressures suggesting restrictive cardiomyopathy.  No evidence of pericardial constriction. Her torsemide increased to 40 mg bid.   She has seen pulmonary.  No evidence for interstitial lung disease.  Autoimmune workup was negative.  She completed pulmonary rehab.   Last follow up 02/2020. Struggling with weight loss, volume stable with NYHA II-III symptoms.  Today she returns for HF follow up. She has not been seen since 02/2020. 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 She is SOB with ADLs and walking on flat ground. She continues to wear 2L oxygen. Denies palpitations, abnormal bleeding, CP, dizziness, edema, or PND/Orthopnea. Appetite ok. No fever or chills. Weight at home 224 pounds. Taking all medications. Wearing CPAP about 3x/week. Occasional ETOH, no tobacco or drugs. Continues to struggle with weight loss.  ECG (personally reviewed): NSR 98 bpm   Labs (9/16): K 3.2, creatinine 1.11, hgb 14.3 Labs (12/16): K 3.3, creatinine 0.96, BNP 23 Labs (2/17): K 4.7, creatinine 0.95, TSH normal Labs (8/17): K 3.9, creatinine 0.93 Labs (12/18): K 3.6, creatinine 0.82, pro-BNP 42, TSH normal Labs (12/19): K 4.4, creatinine 0.9 Labs (2/20): BNP 47, hgb 13.1, K 4.4, creatinine 0.8 Labs (5/20): K 4.2, creatinine 1.01 Labs (9/20): K 3.8, creatinine 0.85, Rf negative, CCP negative, ANA negative, dsDNA negative, SCL-70 negative Labs (11/20): K 3.4, creatinine 0.96 Labs (8/21): BNP 49, K 4.3, creatinine 0.87 Labs (6/22): K 3.1, creatinine 0.98   PMH: 1. Chronic systolic CHF: Nonischemic cardiomyopathy.  - EF 25-30% by echo in 2013.  - Cardiac MRI (2013) with EF 52%, no delayed enhancement.  - LHC in 2013 without significant CAD. - 11/15 echo with EF 40-45%.   - 7/16 echo with EF 40-45%, grade II diastolic dysfunction.  - ETT (9/62) with poor exercise tolerance but no ischemia.  - CPX (12/16): peak VO2 15.7 (23.5 adjusted for ideal body weight), VE/VCO2 slope 21, RER 1.15 => moderate functional limitation thought to be primarily  due to restrictive lung physiology.  - Echo (11/17): EF 45-50%, mild diffuse hypokinesis, normal RV size and systolic function.  - Echo (11/19): EF 50-55%, mildly dilated and mildly dysfunctional RV with respirophasic septal bounce noted, normal IVC and PA pressure estimate.  - RHC (3/20): mean 19, PA 54/18 mean 38, mean PCWP 23, CI 2.77, PVR 2.79 WU. Equalization of pressures suggestive of restrictive  cardiomyopathy, not suggestive of pericardial constriction.  2. Asthma: PFTs (4/16) with mild obstruction, severe restriction and severely decreased DLCO => think primarily due to body habitus.  3. Depression 4. Hypothyroidism 5. 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  6. Angioedema with ACEI 7. OHS/OSA: CPAP at night, oxygen during the day.  8. Chronic leukocytosis: Negative workup.  9. BPPV 10. Obesity  11. Scoliosis: CT chest (5/20) showed no ILD, severe scoliosis.    Current Outpatient Medications  Medication Sig Dispense Refill   albuterol (VENTOLIN HFA) 108 (90 Base) MCG/ACT inhaler INHALE 1-2 PUFFS BY MOUTH EVERY 6 HOURS AS NEEDED FOR WHEEZE OR SHORTNESS OF BREATH 8.5 each 3   buPROPion (WELLBUTRIN XL) 300 MG 24 hr tablet TAKE 1 TABLET BY MOUTH EVERY DAY 90 tablet 1   cholestyramine light (PREVALITE) 4 g packet Take 1 packet (4 g total) by mouth 2 (two) times daily. 180 packet 3   clobetasol cream (TEMOVATE) 0.05 % Apply 1 application topically See admin instructions. Apply daily to affected areas of genital area as directed     ipratropium-albuterol (DUONEB) 0.5-2.5 (3) MG/3ML SOLN Take 3 mLs by nebulization 2 (two) times daily as needed (For wheezing or shoortness of breath). (Patient taking differently: Take 3 mLs by nebulization 2 (two) times daily as needed (For wheezing or shortness of breath).) 180 mL 5   levothyroxine (SYNTHROID) 112 MCG tablet Take 1 tablet (112 mcg total) by mouth daily. 90 tablet 3   metoprolol succinate (TOPROL-XL) 25 MG 24 hr tablet TAKE 1 TABLET BY MOUTH DAILY. LAST REFILL MUST CALL OFFICE FOR APPOINTMENT FOR ANYMORE REFILLS 30 tablet 0   potassium chloride SA (KLOR-CON) 20 MEQ tablet Take 1 tablet (20 mEq total) by mouth daily. 30 tablet 3   spironolactone (ALDACTONE) 25 MG tablet TAKE 1 TABLET BY MOUTH EVERY DAY 30 tablet 11   SYMBICORT 160-4.5 MCG/ACT inhaler TAKE 2 PUFFS BY MOUTH TWICE A DAY 10.2 each 3   torsemide (DEMADEX) 20 MG  tablet TAKE 4 TABLETS (80 MG TOTAL) BY MOUTH IN THE MORNING AND 3 TABLETS (60 MG TOTAL) EVERY EVENING. ABSOLUTE LAST REFILL WITHOUT OFFICE VISIT PLEASE CALL [phone] TO SCHEDULE. 105 tablet 0   valsartan (DIOVAN) 80 MG tablet TAKE 1 TABLET BY MOUTH DAILY. LAST REFILL MUST CALL OFFICE FOR APPOINTMENT FOR ANYMORE REFILLS 30 tablet 0   No current facility-administered medications for this encounter.    Allergies:   Lisinopril   Social History:  The patient  reports that she has never smoked. She has never used smokeless tobacco. She reports that she does not drink alcohol and does not use drugs.   Family History:  The patient's family history includes Alcoholism in her mother; Asthma in her paternal grandmother; Breast cancer (age of onset: 41) in her paternal grandmother; Breast cancer (age of onset: 5) in her maternal aunt and paternal aunt; Colon cancer (age of onset: 40) in her maternal grandmother; Depression in her mother; Heart attack in her maternal grandfather, maternal grandmother, paternal grandfather, and paternal grandmother; Heart disease in her father, maternal grandfather, and paternal grandmother; Hypertension  in her father.   ROS:  Please see the history of present illness.   All other systems are personally reviewed and negative.   Recent Labs: 10/20/2020: ALT 17; BUN 20; Creatinine, Ser 0.98; Potassium 3.1; Sodium 136; TSH 3.83 11/30/2020: Hemoglobin 13.3; Platelets 344.0  Personally reviewed   Wt Readings from Last 3 Encounters:  07/03/21 103 kg (227 lb)  02/20/21 102.5 kg (226 lb)  01/20/21 102.3 kg (225 lb 9.6 oz)    BP 96/68    Pulse (!) 101    Wt 103 kg (227 lb)    SpO2 93% Comment: on 2L   BMI 44.33 kg/m   PHYSICAL EXAM: General:  NAD. No resp difficulty, walked into clinic on 2L oxygen HEENT: Normal Neck: Supple. No JVD. Carotids 2+ bilat; no bruits. No lymphadenopathy or thryomegaly appreciated. Cor: PMI nondisplaced. Regular rate & rhythm. No rubs, gallops  or murmurs. Lungs: Diminished throughout Abdomen: Obese, nontender, nondistended. No hepatosplenomegaly. No bruits or masses. Good bowel sounds. Extremities: No cyanosis, clubbing, rash, edema Neuro: Alert & oriented x 3, cranial nerves grossly intact. Moves all 4 extremities w/o difficulty. Affect pleasant.  ASSESSMENT AND PLAN: 1. Chronic Systolic CHF: Nonischemic cardiomyopathy.  No CAD on prior cath, prior cMRI showed no late gadolinium enhancement.  No family history of cardiomyopathy.  No ETOH/substance abuse.  No uncontrolled HTN.  Possible that she had viral myocarditis. Echo 11/19 showed LVEF up to 50-55% but with mildly dilated/dysfunctional RV, IVC appeared normal and PA pressure estimation was normal, septal bounce was noted.  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  No ventricular interdependence to suggest pericardial constriction. NYHA class II-III symptoms, stable. I do not think that she is significantly volume overloaded. I think that a lot of her symptomatology is due to restriction from excess weight => OHS/OSA.  - Continue torsemide 80 qam/60 qpm.  BMET and BNP today.  - Continue Toprol XL 25 mg daily.  - Continue spironolactone 25 mg daily.   - Continue valsartan 80 mg daily.   - Holding off on SGLT2i with large panus. - We discussed repeat CPX to quantify HF limitation, she would like to hold off. Repeat echo at followup.  2. Asthma: No wheezing on exam.  3. OHS/OSA: She uses CPAP at night and oxygen during the day.  OHS is likely due to lung restriction from obesity and scoliosis, CT chest in 5/20 showed no ILD but did show severe scoliosis.  I think OHS/OSA plays a large role in her dyspnea.  Encouraged compliance with CPAP. 4. Obesity: Body mass index is 44.33 kg/m. Weight loss is imperative.  She tried [facility] but did not find it helpful.  Not  interested in bariatric surgery right now.  - Refer to pharmacy for semaglutide. 5. Pre-Diabetes: A1c 6.0 (9/22).  Followup in 3 months with Dr. Shirlee Latch + echo.   Signed, Jacklynn Ganong, FNP  07/03/2021  [facility] [address] [facility] [place] [address] [phone] (office) [phone] (fax)

## 2021-07-02 ENCOUNTER — Other Ambulatory Visit (HOSPITAL_COMMUNITY): Payer: Self-pay | Admitting: Cardiology

## 2021-07-02 DIAGNOSIS — I5042 Chronic combined systolic (congestive) and diastolic (congestive) heart failure: Secondary | ICD-10-CM

## 2021-07-03 ENCOUNTER — Telehealth (HOSPITAL_COMMUNITY): Payer: Self-pay | Admitting: Surgery

## 2021-07-03 ENCOUNTER — Other Ambulatory Visit: Payer: Self-pay

## 2021-07-03 ENCOUNTER — Encounter (HOSPITAL_COMMUNITY): Payer: Self-pay

## 2021-07-03 ENCOUNTER — Other Ambulatory Visit (HOSPITAL_COMMUNITY): Payer: Self-pay

## 2021-07-03 ENCOUNTER — Ambulatory Visit (HOSPITAL_COMMUNITY)
Admission: RE | Admit: 2021-07-03 | Discharge: 2021-07-03 | Disposition: A | Discharge status: Home / Self Care | Payer: Commercial Managed Care - PPO | Source: Ambulatory Visit | Attending: Family Medicine | Admitting: Family Medicine

## 2021-07-03 VITALS — BP 96/68 | HR 101 | Wt 227.0 lb

## 2021-07-03 DIAGNOSIS — I5022 Chronic systolic (congestive) heart failure: Secondary | ICD-10-CM | POA: Insufficient documentation

## 2021-07-03 DIAGNOSIS — Z9989 Dependence on other enabling machines and devices: Secondary | ICD-10-CM

## 2021-07-03 DIAGNOSIS — I5042 Chronic combined systolic (congestive) and diastolic (congestive) heart failure: Secondary | ICD-10-CM | POA: Diagnosis not present

## 2021-07-03 DIAGNOSIS — G4733 Obstructive sleep apnea (adult) (pediatric): Secondary | ICD-10-CM | POA: Insufficient documentation

## 2021-07-03 DIAGNOSIS — J45909 Unspecified asthma, uncomplicated: Secondary | ICD-10-CM | POA: Diagnosis not present

## 2021-07-03 DIAGNOSIS — R7303 Prediabetes: Secondary | ICD-10-CM | POA: Diagnosis not present

## 2021-07-03 DIAGNOSIS — Z6841 Body Mass Index (BMI) 40.0 and over, adult: Secondary | ICD-10-CM | POA: Insufficient documentation

## 2021-07-03 DIAGNOSIS — I428 Other cardiomyopathies: Secondary | ICD-10-CM | POA: Insufficient documentation

## 2021-07-03 DIAGNOSIS — E662 Morbid (severe) obesity with alveolar hypoventilation: Secondary | ICD-10-CM | POA: Diagnosis not present

## 2021-07-03 DIAGNOSIS — Z79899 Other long term (current) drug therapy: Secondary | ICD-10-CM | POA: Insufficient documentation

## 2021-07-03 DIAGNOSIS — E669 Obesity, unspecified: Secondary | ICD-10-CM | POA: Diagnosis not present

## 2021-07-03 LAB — BASIC METABOLIC PANEL
Anion gap: 12 (ref 5–15)
BUN: 17 mg/dL (ref 6–20)
CO2: 37 mmol/L — ABNORMAL HIGH (ref 22–32)
Calcium: 8.9 mg/dL (ref 8.9–10.3)
Chloride: 89 mmol/L — ABNORMAL LOW (ref 98–111)
Creatinine, Ser: 1.11 mg/dL — ABNORMAL HIGH (ref 0.44–1.00)
GFR, Estimated: >60 mL/min (ref >60)
Glucose, Bld: 138 mg/dL — ABNORMAL HIGH (ref 70–99)
Potassium: 3.3 mmol/L — ABNORMAL LOW (ref 3.5–5.1)
Sodium: 138 mmol/L (ref 135–145)

## 2021-07-03 LAB — BRAIN NATRIURETIC PEPTIDE: B Natriuretic Peptide: 15.3 pg/mL (ref 0.0–100.0)

## 2021-07-03 MED ORDER — METOPROLOL SUCCINATE ER 25 MG PO TB24: ORAL_TABLET | ORAL | 4 refills | Status: DC

## 2021-07-03 MED ORDER — POTASSIUM CHLORIDE CRYS ER 20 MEQ PO TBCR: 20.0000 meq | EXTENDED_RELEASE_TABLET | Freq: Two times a day (BID) | ORAL | 4 refills | Status: DC

## 2021-07-03 MED ORDER — POTASSIUM CHLORIDE CRYS ER 20 MEQ PO TBCR: 20.0000 meq | EXTENDED_RELEASE_TABLET | Freq: Every day | ORAL | 4 refills | Status: DC

## 2021-07-03 MED ORDER — SPIRONOLACTONE 25 MG PO TABS: 25.0000 mg | ORAL_TABLET | Freq: Every day | ORAL | 4 refills | Status: DC

## 2021-07-03 MED ORDER — TORSEMIDE 20 MG PO TABS
ORAL_TABLET | ORAL | 4 refills | Status: DC
Start: 2021-07-03 — End: 2022-07-09

## 2021-07-03 MED ORDER — VALSARTAN 80 MG PO TABS: ORAL_TABLET | ORAL | 4 refills | Status: DC

## 2021-07-03 NOTE — Patient Instructions (Addendum)
Thank you for coming in today  Labs were done today, if any labs are abnormal the clinic will call you  You have been referred for [facility] the pharmacist will contact you   Your physician recommends that you schedule a follow-up appointment in: 3 months with Dr. Aundra Dubin with echocardiogram  Your physician has requested that you have an echocardiogram. Echocardiography is a painless test that uses sound waves to create images of your heart. It provides your doctor with information about the size and shape of your heart and how well your hearts chambers and valves are working. This procedure takes approximately one hour. There are no restrictions for this procedure.   At the [facility], you and your health needs are our priority. As part of our continuing mission to provide you with exceptional heart care, we have created designated Provider Care Teams. These Care Teams include your primary Cardiologist (physician) and Advanced Practice Providers (APPs- Physician Assistants and Nurse Practitioners) who all work together to provide you with the care you need, when you need it.   You may see any of the following providers on your designated Care Team at your next follow up: Dr Glori Bickers Dr Haynes Kerns, NP Lyda Jester, [place] [facility] [place], [place] Audry Riles, PharmD   Please be sure to bring in all your medications bottles to every appointment.   If you have any questions or concerns before your next appointment please send [place] a message through [place] or call our office at [phone].    TO LEAVE A MESSAGE FOR THE NURSE SELECT OPTION 2, PLEASE LEAVE A MESSAGE INCLUDING: YOUR NAME DATE OF BIRTH CALL BACK NUMBER REASON FOR CALL**this is important as we prioritize the call backs  YOU WILL RECEIVE A CALL BACK THE SAME DAY AS LONG AS YOU CALL BEFORE 4:00 PM

## 2021-07-03 NOTE — Telephone Encounter (Signed)
I contacted patient to review results and recommendations per provider.  She is aware of new potassium dose and will return next Monday for repeat lab work.

## 2021-07-03 NOTE — Telephone Encounter (Signed)
-----   Message from Rafael Bihari, [place] sent at 07/03/2021  2:38 PM EST ----- K is low. Take 40 KCL today and increase daily dose tomorrow to 20 meQ bid. Repeat BMET in 1 weeks.

## 2021-07-04 ENCOUNTER — Telehealth: Payer: Self-pay | Admitting: Student-PharmD

## 2021-07-04 MED ORDER — WEGOVY 0.25 MG/0.5ML ~~LOC~~ SOAJ: 0.2500 mg | SUBCUTANEOUS | 0 refills | Status: DC

## 2021-07-04 MED ORDER — OZEMPIC (0.25 OR 0.5 MG/DOSE) 2 MG/1.5ML ~~LOC~~ SOPN: PEN_INJECTOR | SUBCUTANEOUS | 1 refills | Status: DC

## 2021-07-04 NOTE — Telephone Encounter (Signed)
-----   Message from Awilda Metro, RPH-CPP sent at 07/03/2021 12:44 PM EST ----- Regarding: FW: semaglutide referral  ----- Message ----- From: Demetrius Charity, RN Sent: 07/03/2021  12:31 PM EST To: Awilda Metro, RPH-CPP Subject: semaglutide referral                           Greetings,   Patient was seen in our clinic today, our NP [facility] would like a referral for Semaglutide   Thank you

## 2021-07-04 NOTE — Telephone Encounter (Signed)
Received referral from [facility], NP, to start semaglutide for this patient for weight loss. They meet FDA approved criteria for semaglutide for use in obesity given BMI 44.33. Obesity is complicated by chronic conditions including HTN, HF, OSA, DM. No contraindications seen in the chart to [facility] use.   Submitted PA for [facility] 07/04/21 but it returned saying the medication is on formulary. Sent Rx in to see what the cost is and it is $1500, which ran through insurance so it is on formulary but they do not actually cover the cost of the medication. Will try submitting PA for Ozempic since she had an A1c of 6.5 in 2021 meeting diabetes criteria. Per chart review she has been on metformin in the past.   PA for Ozempic came back prior to answering clinical questions saying it has been approved based on information available in the patient's profile. Approved through 07/04/22. Sent Rx to pharmacy and confirmed cost is $24.99.   Called to speak with the patient who is interested in starting, however states that she has a $6000 deductible each year which resets in July. She only just reached the deductible for this year this month. She can afford the copay now at $25 but is concerned that she will not be able to continue on the medication once her deductible resets in July. Discussed out of pocket costs of Ozempic (~$1000) which is not affordable for her to pay at one time until she meets her deductible. We discussed benefits/risks as well as administration of Ozempic. Explained that once treatment is stopped weight tends to slowly go back up. She decided to not start treatment at this time but will let us know if she changes her mind.

## 2021-07-10 ENCOUNTER — Other Ambulatory Visit: Payer: Self-pay

## 2021-07-10 ENCOUNTER — Ambulatory Visit (HOSPITAL_COMMUNITY)
Admission: RE | Admit: 2021-07-10 | Discharge: 2021-07-10 | Disposition: A | Discharge status: Home / Self Care | Payer: Commercial Managed Care - PPO | Source: Ambulatory Visit | Attending: Cardiology | Admitting: Cardiology

## 2021-07-10 DIAGNOSIS — I5042 Chronic combined systolic (congestive) and diastolic (congestive) heart failure: Secondary | ICD-10-CM | POA: Diagnosis not present

## 2021-07-10 LAB — BASIC METABOLIC PANEL
Anion gap: 9 (ref 5–15)
BUN: 14 mg/dL (ref 6–20)
CO2: 32 mmol/L (ref 22–32)
Calcium: 9.1 mg/dL (ref 8.9–10.3)
Chloride: 94 mmol/L — ABNORMAL LOW (ref 98–111)
Creatinine, Ser: 1 mg/dL (ref 0.44–1.00)
GFR, Estimated: >60 mL/min (ref >60)
Glucose, Bld: 142 mg/dL — ABNORMAL HIGH (ref 70–99)
Potassium: 4.3 mmol/L (ref 3.5–5.1)
Sodium: 135 mmol/L (ref 135–145)

## 2021-07-17 ENCOUNTER — Other Ambulatory Visit (HOSPITAL_COMMUNITY): Payer: Self-pay | Admitting: Family Medicine

## 2021-08-22 ENCOUNTER — Other Ambulatory Visit: Payer: Self-pay | Admitting: Internal Medicine

## 2021-09-04 ENCOUNTER — Other Ambulatory Visit: Payer: Self-pay | Admitting: Pulmonary Disease

## 2021-09-18 ENCOUNTER — Other Ambulatory Visit: Payer: Self-pay | Admitting: Internal Medicine

## 2021-09-20 ENCOUNTER — Ambulatory Visit (INDEPENDENT_AMBULATORY_CARE_PROVIDER_SITE_OTHER): Payer: Commercial Managed Care - PPO | Admitting: Pulmonary Disease

## 2021-09-20 ENCOUNTER — Encounter: Payer: Self-pay | Admitting: Pulmonary Disease

## 2021-09-20 VITALS — BP 116/89 | HR 104 | Temp 98.3°F | Ht 60.0 in | Wt 232.0 lb

## 2021-09-20 DIAGNOSIS — G4733 Obstructive sleep apnea (adult) (pediatric): Secondary | ICD-10-CM

## 2021-09-20 DIAGNOSIS — I429 Cardiomyopathy, unspecified: Secondary | ICD-10-CM | POA: Diagnosis not present

## 2021-09-20 MED ORDER — SYMBICORT 160-4.5 MCG/ACT IN AERO: INHALATION_SPRAY | RESPIRATORY_TRACT | 6 refills | Status: DC

## 2021-09-20 MED ORDER — ALBUTEROL SULFATE HFA 108 (90 BASE) MCG/ACT IN AERS: INHALATION_SPRAY | RESPIRATORY_TRACT | 3 refills | Status: DC

## 2021-09-20 NOTE — Patient Instructions (Signed)
Obstructive sleep apnea ?-Try and get back to resuming your use of your CPAP ? ?Asthma ?-Refills for Symbicort and albuterol will be sent to pharmacy for you ? ?Heart failure ?-I will keep an eye out for your echocardiogram ? ?Continue weight loss efforts ? ?Dietary restriction ? ?Exercise as tolerated ? ?I will see you in 6 months ?

## 2021-09-20 NOTE — Progress Notes (Signed)
? ?Subjective:  ? ? Patient ID: Meredith Diaz, female    DOB: May 06, 1981, 41 y.o.   MRN: [id number] ? ?Patient with a history of mild obstructive obstructive sleep apnea ? ?Has not been using the CPAP regularly ?-Was having issues with the mask ?-She did receive a new mask ?-Has just not resumed it yet ? ?Continues to work to try and lose some weight ? ?Breathing is about the same ?No changes in her health since her last visit ?Shortness of breath with mild exertion ? ?She does have chronic respiratory failure for which she is on oxygen supplementation ?-Saturations usually run above 90 ? ?No recent exacerbation ?Continues to use Symbicort on a regular basis ?Albuterol use as needed ? ? ?Multiple comorbidities including systolic and diastolic congestive heart failure, hypothyroidism, chronic fatigue, kyphoscoliosis ?-Continues to follow-up with cardiology ?-Last echocardiogram was stable from previous ? ?Diagnosed with obstructive sleep apnea in 2016 ?Usually goes to bed about 10 PM to midnight ?Takes her between 10 and 30 minutes to fall asleep ?Rarely wakes up in the middle of the night ? ?Final awakening time about 8 AM ? ?Was seen in the past by by Dr. Marchelle Gearing for possible interstitial lung disease ?High-resolution CT scan of the chest was not confirmatory of interstitial lung disease ? ?PFT from 2016 did reveal severe restrictive disease ?Significant decrease in lung volumes also noted on CT scan of the chest ?Significant subcutaneous tissue, mediastinal lipomatosis ? ?She has been referred to pulmonary rehab, referred to bariatric surgery as well ? ? ? ?Past Medical History:  ?Diagnosis Date  ? Anxiety   ? Asthma   ? Back pain   ? Chronic combined systolic and diastolic CHF (congestive heart failure) (HCC) 07/2011  ? a. Prior Echo: EF was 25-30%; b. No Sig CAD on CATH; c. No significant finding on Cardiac MRI; d. follow-up Echo 2014 --  EF of 50%;  e. 03/2014 Echo: EF 40-45%, gr 1 DD; f. 11/2014 Echo: EF  40-45%, Gr 2 DD.  ? Congenital heart disease, adult   ? a. 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  ? Depression   ? Dyspnea   ? Essential hypertension   ? Family history of breast cancer   ? Family history of colon cancer   ? Fibromyalgia   ? Gallbladder problem   ? Hypothyroidism   ? IBS (irritable bowel syndrome)   ? Morbid obesity (HCC)   ? Nonischemic cardiomyopathy (HCC)   ? a. unclear etiology;  b. 01/2012 Cardiac MRI: no infiltrative pathology, EF 52%;  b. 11/2014 Echo: EF 40-45%, gr 2 DD.  ? Scoliosis   ? Sleep apnea   ? Stomach problems   ? Thyroid disease   ? Vitamin B 12 deficiency   ? Vitamin D deficiency   ? ?Social History  ? ?Socioeconomic History  ? Marital status: Married  ?  Spouse name: [name] Rieger  ? Number of children: 0  ? Years of education: Not on file  ? Highest education level: Not on file  ?Occupational History  ? Occupation: Programmer, multimedia  ?Tobacco Use  ? Smoking status: Never  ? Smokeless tobacco: Never  ?Vaping Use  ? Vaping Use: Never used  ?Substance and Sexual Activity  ? Alcohol use: No  ? Drug use: No  ? Sexual activity: Not on file  ?Other Topics Concern  ? Not on  file  ?Social History Narrative  ? Not on file  ? ?Social Determinants of Health  ? ?Financial Resource Strain: Not on file  ?Food Insecurity: Not on file  ?Transportation Needs: Not on file  ?Physical Activity: Not on file  ?Stress: Not on file  ?Social Connections: Not on file  ?Intimate Partner Violence: Not on file  ? ?Family History  ?Problem Relation Age of Onset  ? Depression Mother   ? Alcoholism Mother   ? Hypertension Father   ? Heart disease Father   ? Colon cancer Maternal Grandmother 50  ? Heart attack Maternal Grandmother   ? Heart disease Maternal Grandfather   ? Heart attack Maternal Grandfather   ? Heart disease Paternal Grandmother   ? Breast cancer Paternal Grandmother 44  ?     possible  bilateral cancer  ? Asthma Paternal Grandmother   ? Heart attack Paternal Grandmother   ? Heart attack Paternal Grandfather   ? Breast cancer Maternal Aunt 50  ? Breast cancer Paternal Aunt 24  ? Stroke Neg Hx   ? Thyroid disease Neg Hx   ? ? ? ? ? ?Review of Systems  ?Constitutional:  Positive for fatigue. Negative for fever.  ?Respiratory:  Positive for apnea and shortness of breath. Negative for cough.   ?Genitourinary:  Negative for dysuria.  ?Skin:  Negative for rash.  ?Allergic/Immunologic: Negative.   ?Psychiatric/Behavioral:  Positive for sleep disturbance. Negative for dysphoric mood.   ? ?   ?Objective:  ? Physical Exam ?Constitutional:   ?   General: She is not in acute distress. ?   Appearance: She is obese.  ?HENT:  ?   Head: Normocephalic and atraumatic.  ?Eyes:  ?   Pupils: Pupils are equal, round, and reactive to light.  ?Cardiovascular:  ?   Heart sounds: No murmur heard. ?  No friction rub.  ?Pulmonary:  ?   Effort: Pulmonary effort is normal. No respiratory distress.  ?   Breath sounds: Normal breath sounds. No stridor. No wheezing or rhonchi.  ?Musculoskeletal:  ?   Cervical back: No rigidity or tenderness.  ?Skin: ?   Coloration: Skin is not pale.  ?Neurological:  ?   Mental Status: She is alert.  ?Psychiatric:     ?   Mood and Affect: Mood normal.     ?   Behavior: Behavior normal.  ? ?There were no vitals filed for this visit. ? ? ?  02/25/2019  ?  9:00 AM 06/10/2018  ?  2:00 PM  ?Results of the Epworth flowsheet  ?Sitting and reading 2 1  ?Watching TV 2 1  ?Sitting, inactive in a public place (e.g. a theatre or a meeting) 0 0  ?As a passenger in a car for an hour without a break 3 3  ?Lying down to rest in the afternoon when circumstances permit 3 3  ?Sitting and talking to someone 0 0  ?Sitting quietly after a lunch without alcohol 1 1  ?In a car, while stopped for a few minutes in traffic 0 0  ?Total score 11 9  ? ?CT scan of the chest reviewed-10/01/2018-reviewed by myself with the patient,  previous CT [address] 19 also reviewed, 10/10/2016 reviewed ? ?Pulmonary function study from 08/25/2014 reviewed showing severe restriction ?PFT from 04/28/2019 with severe restrictive disease ? ?Echocardiogram from 03/11/2018 reviewed showing normal systolic function ? ?ABG in the past with hypercapnia ? ?CPAP compliance not available at present ?   ?Assessment & Plan:  ?[name]  Obstructive sleep apnea ?-Did discuss the importance of getting back to using CPAP on a nightly basis ?-She does have mild sleep apnea ?-This may get better with significant weight loss ? ?.  Obesity hypoventilation ? ?[name]  Systolic and diastolic heart failure ?.  Cardiomyopathy ?-Last echocardiogram was stable from previous ?-She does have an echocardiogram scheduled for August ? ? ? ?.  Severe restrictive lung disease ?-PFT shows severe restriction ?-Likely related to body habitus ? ?[name]  Hypothyroidism ?[name]  Severe kyphoscoliosis ? ? ?.  Encouraged to start using CPAP on a nightly basis ?[name]  Continue oxygen supplementation ?[name]  Continue Symbicort and albuterol as needed ? ?.  Weight loss efforts encouraged ? ?.  Records did reveal she was approved for Ozempic ?[name]  Unable to initiate treatment secondary to cost ? ? ?.  I will see her back in 6 months ? ?.  Risk of decompensation is significant with progressive deconditioning, significant risk for worsening especially with not being able to lose weight, not been able to use CPAP. ?.  She does have significant frustrations with all her health problems and limitations ? ? ?.  Tentative follow-up in 6 months ?

## 2021-10-21 ENCOUNTER — Other Ambulatory Visit: Payer: Self-pay | Admitting: Internal Medicine

## 2021-11-22 ENCOUNTER — Other Ambulatory Visit: Payer: Self-pay | Admitting: Internal Medicine

## 2021-11-26 ENCOUNTER — Other Ambulatory Visit: Payer: Self-pay | Admitting: Internal Medicine

## 2021-12-03 ENCOUNTER — Other Ambulatory Visit (HOSPITAL_COMMUNITY): Payer: Self-pay | Admitting: Family Medicine

## 2021-12-03 DIAGNOSIS — I5042 Chronic combined systolic (congestive) and diastolic (congestive) heart failure: Secondary | ICD-10-CM

## 2021-12-28 ENCOUNTER — Other Ambulatory Visit (HOSPITAL_COMMUNITY): Payer: Self-pay | Admitting: Cardiology

## 2021-12-28 DIAGNOSIS — I5042 Chronic combined systolic (congestive) and diastolic (congestive) heart failure: Secondary | ICD-10-CM

## 2021-12-29 ENCOUNTER — Other Ambulatory Visit (HOSPITAL_COMMUNITY): Payer: Self-pay | Admitting: *Deleted

## 2021-12-29 DIAGNOSIS — I5042 Chronic combined systolic (congestive) and diastolic (congestive) heart failure: Secondary | ICD-10-CM

## 2022-01-24 ENCOUNTER — Other Ambulatory Visit (HOSPITAL_COMMUNITY): Payer: Self-pay | Admitting: Cardiology

## 2022-01-24 DIAGNOSIS — I5042 Chronic combined systolic (congestive) and diastolic (congestive) heart failure: Secondary | ICD-10-CM

## 2022-01-29 ENCOUNTER — Other Ambulatory Visit: Payer: Self-pay | Admitting: Internal Medicine

## 2022-01-29 ENCOUNTER — Other Ambulatory Visit: Payer: Self-pay | Admitting: Pulmonary Disease

## 2022-03-05 ENCOUNTER — Encounter (HOSPITAL_COMMUNITY): Payer: Self-pay | Admitting: Cardiology

## 2022-03-05 ENCOUNTER — Ambulatory Visit (HOSPITAL_COMMUNITY)
Admission: RE | Admit: 2022-03-05 | Discharge: 2022-03-05 | Disposition: A | Discharge status: Home / Self Care | Payer: Commercial Managed Care - PPO | Source: Ambulatory Visit | Attending: Cardiology | Admitting: Cardiology

## 2022-03-05 ENCOUNTER — Ambulatory Visit (HOSPITAL_BASED_OUTPATIENT_CLINIC_OR_DEPARTMENT_OTHER)
Admission: RE | Admit: 2022-03-05 | Discharge: 2022-03-05 | Disposition: A | Discharge status: Home / Self Care | Payer: Commercial Managed Care - PPO | Source: Ambulatory Visit

## 2022-03-05 ENCOUNTER — Other Ambulatory Visit (HOSPITAL_COMMUNITY): Payer: Self-pay

## 2022-03-05 ENCOUNTER — Telehealth (HOSPITAL_COMMUNITY): Payer: Self-pay

## 2022-03-05 ENCOUNTER — Telehealth (HOSPITAL_COMMUNITY): Payer: Self-pay | Admitting: Cardiology

## 2022-03-05 VITALS — BP 122/80 | HR 91 | Wt 232.6 lb

## 2022-03-05 DIAGNOSIS — G4733 Obstructive sleep apnea (adult) (pediatric): Secondary | ICD-10-CM | POA: Diagnosis not present

## 2022-03-05 DIAGNOSIS — E662 Morbid (severe) obesity with alveolar hypoventilation: Secondary | ICD-10-CM

## 2022-03-05 DIAGNOSIS — I110 Hypertensive heart disease with heart failure: Secondary | ICD-10-CM | POA: Insufficient documentation

## 2022-03-05 DIAGNOSIS — R0602 Shortness of breath: Secondary | ICD-10-CM | POA: Insufficient documentation

## 2022-03-05 DIAGNOSIS — I5042 Chronic combined systolic (congestive) and diastolic (congestive) heart failure: Secondary | ICD-10-CM | POA: Diagnosis not present

## 2022-03-05 DIAGNOSIS — E669 Obesity, unspecified: Secondary | ICD-10-CM | POA: Diagnosis not present

## 2022-03-05 DIAGNOSIS — I428 Other cardiomyopathies: Secondary | ICD-10-CM | POA: Diagnosis not present

## 2022-03-05 DIAGNOSIS — Z79899 Other long term (current) drug therapy: Secondary | ICD-10-CM | POA: Diagnosis not present

## 2022-03-05 DIAGNOSIS — E782 Mixed hyperlipidemia: Secondary | ICD-10-CM | POA: Diagnosis not present

## 2022-03-05 DIAGNOSIS — E1169 Type 2 diabetes mellitus with other specified complication: Secondary | ICD-10-CM | POA: Insufficient documentation

## 2022-03-05 DIAGNOSIS — I5022 Chronic systolic (congestive) heart failure: Secondary | ICD-10-CM | POA: Insufficient documentation

## 2022-03-05 DIAGNOSIS — E785 Hyperlipidemia, unspecified: Secondary | ICD-10-CM | POA: Insufficient documentation

## 2022-03-05 DIAGNOSIS — R7303 Prediabetes: Secondary | ICD-10-CM

## 2022-03-05 DIAGNOSIS — Z7984 Long term (current) use of oral hypoglycemic drugs: Secondary | ICD-10-CM | POA: Insufficient documentation

## 2022-03-05 LAB — LIPID PANEL
Cholesterol: 204 mg/dL — ABNORMAL HIGH (ref 0–200)
HDL: 51 mg/dL (ref >40)
LDL Cholesterol: 125 mg/dL — ABNORMAL HIGH (ref 0–99)
Total CHOL/HDL Ratio: 4 RATIO
Triglycerides: 142 mg/dL (ref <150)
VLDL: 28 mg/dL (ref 0–40)

## 2022-03-05 LAB — BASIC METABOLIC PANEL
Anion gap: 14 (ref 5–15)
BUN: 18 mg/dL (ref 6–20)
CO2: 34 mmol/L — ABNORMAL HIGH (ref 22–32)
Calcium: 9.1 mg/dL (ref 8.9–10.3)
Chloride: 89 mmol/L — ABNORMAL LOW (ref 98–111)
Creatinine, Ser: 1.13 mg/dL — ABNORMAL HIGH (ref 0.44–1.00)
GFR, Estimated: >60 mL/min (ref >60)
Glucose, Bld: 184 mg/dL — ABNORMAL HIGH (ref 70–99)
Potassium: 3.4 mmol/L — ABNORMAL LOW (ref 3.5–5.1)
Sodium: 137 mmol/L (ref 135–145)

## 2022-03-05 LAB — BRAIN NATRIURETIC PEPTIDE: B Natriuretic Peptide: 17.4 pg/mL (ref 0.0–100.0)

## 2022-03-05 LAB — ECHOCARDIOGRAM COMPLETE
Area-P 1/2: 2.93 cm2
S' Lateral: 3 cm

## 2022-03-05 LAB — HEMOGLOBIN A1C
Hgb A1c MFr Bld: 6.8 % — ABNORMAL HIGH (ref 4.8–5.6)
Mean Plasma Glucose: 148.46 mg/dL

## 2022-03-05 MED ORDER — EMPAGLIFLOZIN 10 MG PO TABS: 10.0000 mg | ORAL_TABLET | Freq: Every day | ORAL | 11 refills | Status: DC

## 2022-03-05 MED ORDER — PERFLUTREN LIPID MICROSPHERE
1.0000 mL | INTRAVENOUS | Status: DC | PRN
  Administered 2022-03-05: 2 mL via INTRAVENOUS

## 2022-03-05 MED ORDER — EMPAGLIFLOZIN 10 MG PO TABS: 10.0000 mg | ORAL_TABLET | Freq: Every day | ORAL | 3 refills | Status: DC

## 2022-03-05 NOTE — Patient Instructions (Signed)
START Jardinace 10 mg daily.  Labs done today, your results will be available in MyChart, we will contact you for abnormal readings.  Repeat blood work in 10 days.  Your physician recommends that you schedule a follow-up appointment in: 6 months ( April 2024)  ** please call the office in February to arrange your follow up appointment **  If you have any questions or concerns before your next appointment please send [place] a message through [place] or call our office at [phone].    TO LEAVE A MESSAGE FOR THE NURSE SELECT OPTION 2, PLEASE LEAVE A MESSAGE INCLUDING: YOUR NAME DATE OF BIRTH CALL BACK NUMBER REASON FOR CALL**this is important as we prioritize the call backs  YOU WILL RECEIVE A CALL BACK THE SAME DAY AS LONG AS YOU CALL BEFORE 4:00 PM  At the [facility], you and your health needs are our priority. As part of our continuing mission to provide you with exceptional heart care, we have created designated Provider Care Teams. These Care Teams include your primary Cardiologist (physician) and Advanced Practice Providers (APPs- Physician Assistants and Nurse Practitioners) who all work together to provide you with the care you need, when you need it.   You may see any of the following providers on your designated Care Team at your next follow up: Dr Glori Bickers Dr Loralie Champagne Dr. Roxana Hires, NP Lyda Jester, [place] [facility] [place], [place] Forestine Na, NP Audry Riles, PharmD   Please be sure to bring in all your medications bottles to every appointment.

## 2022-03-05 NOTE — Progress Notes (Signed)
Date:  03/05/2022   ID:  Meredith Diaz, DOB May 27, 1980, MRN [id number]  Provider location: [place] [facility] Type of Visit: Established patient   PCP:  Meredith Broker, MD  Cardiologist:  Dr. Shirlee Diaz  Chief Complaint: Shortness of breath   History of Present Illness: Meredith Diaz is a 41 y.o. female who has a history of nonischemic cardiomyopathy.  This was diagnosed in 2013.  EF was initially 25-30%. Cardiac cath at that time showed no significant CAD.  Cardiac MRI showed no late gadoliniuim enhancement.  Since that time, EF has remained low but has improved a bit.  Most recent echo in 11/17 showed EF 45-50% with diffuse hypokinesis.  She also has asthma.  PFTs showed a severe restrictive defect that may be due to body habitus primarily.    She was admitted in 8/16 with chest pain.  CTA chest showed no PE, lung parenchyma looked normal.  She had ETT with poor exercise tolerance but no evidence for ischemia.  CPX was done in 12/16, showing moderate functional limitation due primarily to lung restriction.    Admitted 11/4 - 03/13/18 for increased DOE. Diuresed well with IV lasix and treated for asthma exacerbation. Treated with solumedrol and prednisone burst. Echo with EF 50-55%, mildly dilated/dysfunctional RV, septal bounce noted but IVC normal in size.    Due to increased dyspnea, RHC was done in 3/20.  Right and left heart filling pressures were elevated with near equalization of pressures suggesting restrictive cardiomyopathy.  No evidence of pericardial constriction.  I increased her torsemide to 40 mg bid.   She has seen pulmonary.  No evidence for interstitial lung disease.  Autoimmune workup was negative.  She completed pulmonary rehab.   Echo was done today and reviewed, EF 50-55%, normal RV, normal IVC.   She returns for followup of CHF.  She is using oxygen during the day and CPAP on most nights. Weight is up 5 lbs since last appointment.   She was unable to get semaglutide due to lack of insurance coverage.  She has generalized fatigue and poor energy.  She does some walking, gets short of breath and joint pain after walking 100-200 feet.  No chest pain.  No palpitations.  No lightheadedness.   ECG (personally reviewed): NSR, nonspecific T wave flattening   Labs (9/16): K 3.2, creatinine 1.11, hgb 14.3 Labs (12/16): K 3.3, creatinine 0.96, BNP 23 Labs (2/17): K 4.7, creatinine 0.95, TSH normal Labs (8/17): K 3.9, creatinine 0.93 Labs (12/18): K 3.6, creatinine 0.82, pro-BNP 42, TSH normal Labs (12/19): K 4.4, creatinine 0.9 Labs (2/20): BNP 47, hgb 13.1, K 4.4, creatinine 0.8 Labs (5/20): K 4.2, creatinine 1.01 Labs (9/20): K 3.8, creatinine 0.85, Rf negative, CCP negative, ANA negative, dsDNA negative, SCL-70 negative Labs (11/20): K 3.4, creatinine 0.96 Labs (8/21): BNP 49, K 4.3, creatinine 0.87 Labs (3/23): K 4.3, creatinine 1.0   PMH: 1. Chronic systolic CHF: Nonischemic cardiomyopathy.  - EF 25-30% by echo in 2013.  - Cardiac MRI (2013) with EF 52%, no delayed enhancement.  - LHC in 2013 without significant CAD. - 11/15 echo with EF 40-45%.   - 7/16 echo with EF 40-45%, grade II diastolic dysfunction.  - ETT (5/63) with poor exercise tolerance but no ischemia.  - CPX (12/16): peak VO2 15.7 (23.5 adjusted for ideal body weight), VE/VCO2 slope 21, RER 1.15 => moderate functional limitation thought to be primarily due to restrictive lung physiology.  - Echo (  11/17): EF 45-50%, mild diffuse hypokinesis, normal RV size and systolic function.  - Echo (11/19): EF 50-55%, mildly dilated and mildly dysfunctional RV with respirophasic septal bounce noted, normal IVC and PA pressure estimate.  - RHC (3/20): mean 19, PA 54/18 mean 38, mean PCWP 23, CI 2.77, PVR 2.79 WU. Equalization of pressures suggestive of restrictive cardiomyopathy, not suggestive of pericardial constriction.  - Echo (10/23): EF 50-55%, normal RV, normal  IVC. 2. Asthma: PFTs (4/16) with mild obstruction, severe restriction and severely decreased DLCO => think primarily due to body habitus.  3. Depression 4. Hypothyroidism 5. 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  6. Angioedema with ACEI 7. OHS/OSA: CPAP at night, oxygen during the day.  8. Chronic leukocytosis: Negative workup.  9. BPPV 10. Obesity  11. Scoliosis: CT chest (5/20) showed no ILD, severe scoliosis.     Current Outpatient Medications  Medication Sig Dispense Refill   albuterol (VENTOLIN HFA) 108 (90 Base) MCG/ACT inhaler INHALE 1-2 PUFFS BY MOUTH EVERY 6 HOURS AS NEEDED FOR WHEEZE OR SHORTNESS OF BREATH 18 each 1   buPROPion (WELLBUTRIN XL) 300 MG 24 hr tablet TAKE 1 TABLET BY MOUTH EVERY DAY ANNUAL APPT DUE IN JUNE MUST SEE PROVIDER FOR REFILL 30 tablet 0   cholestyramine light (PREVALITE) 4 g packet Take 4 g by mouth as needed.     clobetasol cream (TEMOVATE) [id number] % Apply 1 application topically See admin instructions. Apply daily to affected areas of genital area as directed     ipratropium-albuterol (DUONEB) 0.5-2.5 (3) MG/3ML SOLN Take 3 mLs by nebulization 2 (two) times daily as needed (For wheezing or shoortness of breath). 180 mL 5   levothyroxine (SYNTHROID) 112 MCG tablet Take 1 tablet (112 mcg total) by mouth daily. 90 tablet 3   metoprolol succinate (TOPROL-XL) 25 MG 24 hr tablet TAKE 1 TABLET BY MOUTH DAILY PLEASE KEEP FOLLOW UP APPOINTMENT 30 tablet 6   potassium chloride SA (KLOR-CON M) 20 MEQ tablet Take 1 tablet (20 mEq total) by mouth 2 (two) times daily. 30 tablet 4   spironolactone (ALDACTONE) 25 MG tablet Take 1 tablet (25 mg total) by mouth daily. 30 tablet 4   SYMBICORT 160-4.5 MCG/ACT inhaler 2 puffs every 12 10.2 each 6   torsemide (DEMADEX) 20 MG tablet Take 4 tablets (80 mg total) by mouth in the morning AND 3 tablets (60 mg total) every evening. Absolute last refill without office visit please call [phone] to schedule. 210  tablet 4   valsartan (DIOVAN) 80 MG tablet TAKE 1 TABLET BY MOUTH EVERY DAY PLEASE KEEP FOLLOW UP APPOINTMENT 30 tablet 6   empagliflozin (JARDIANCE) 10 MG TABS tablet Take 1 tablet (10 mg total) by mouth daily before breakfast. 30 tablet 11   No current facility-administered medications for this encounter.    Allergies:   Lisinopril   Social History:  The patient  reports that she has never smoked. She has never used smokeless tobacco. She reports that she does not drink alcohol and does not use drugs.   Family History:  The patient's family history includes Alcoholism in her mother; Asthma in her paternal grandmother; Breast cancer (age of onset: 4) in her paternal grandmother; Breast cancer (age of onset: 77) in her maternal aunt and paternal aunt; Colon cancer (age of onset: 57) in her maternal grandmother; Depression in her mother; Heart attack in her maternal grandfather, maternal grandmother, paternal grandfather, and paternal grandmother; Heart disease in her father, maternal grandfather, and paternal grandmother;  Hypertension in her father.   ROS:  Please see the history of present illness.   All other systems are personally reviewed and negative.   Exam:   BP 122/80   Pulse 91   Wt 105.5 kg (232 lb 9.6 oz)   SpO2 95% Comment: 2l n/c  BMI 45.43 kg/m  General: NAD, obese.  Neck: No JVD, no thyromegaly or thyroid nodule.  Lungs: Clear to auscultation bilaterally with normal respiratory effort. CV: Nondisplaced PMI.  Heart regular S1/S2, no S3/S4, no murmur.  No peripheral edema.  No carotid bruit.  Normal pedal pulses.  Abdomen: Soft, nontender, no hepatosplenomegaly, no distention.  Skin: Intact without lesions or rashes.  Neurologic: Alert and oriented x 3.  Psych: Normal affect. Extremities: No clubbing or cyanosis.  HEENT: Normal.   Recent Labs: 03/05/2022: B Natriuretic Peptide 17.4; BUN 18; Creatinine, Ser 1.13; Potassium 3.4; Sodium 137  Personally reviewed   Wt  Readings from Last 3 Encounters:  03/05/22 105.5 kg (232 lb 9.6 oz)  09/20/21 105.2 kg (232 lb)  07/03/21 103 kg (227 lb)     ASSESSMENT AND PLAN:  1. Chronic systolic CHF: Nonischemic cardiomyopathy.  No CAD on prior cath, prior cMRI showed no late gadolinium enhancement.  No family history of cardiomyopathy.  No ETOH/substance abuse.  No uncontrolled HTN.  Possible that she had viral myocarditis. Echo in 03/11/18 showed LVEF up to 50-55% but with mildly dilated/dysfunctional RV, IVC appeared normal and PA pressure estimation was normal, septal bounce was noted.  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  No ventricular interdependence to suggest pericardial constriction. Echo done today showed EF 50-55%, normal RV, normal IVC.  NYHA class II-III symptoms, stable (primarily fatigue). I do not think that she is significantly volume overloaded. I think that a lot of her symptomatology is due to restriction from excess weight => OHS/OSA.  - Start Jardiance 10 mg daily and decrease torsemide to 80 qam/40 qpm.  BMET/BNP today and in 10 days.  - Continue Toprol XL 25 mg daily.  - Continue spironolactone 25 daily.   - Continue valsartan 80 mg daily.   2. Asthma: No wheezing on exam.  3. OHS/OSA: She uses CPAP at night and oxygen during the day.  OHS is likely due to lung restriction from obesity and scoliosis, CT chest in 5/20 showed no ILD but did show severe scoliosis.  I think OHS/OSA plays a large role in her dyspnea.  She has had a hard time using CPAP but is trying.  4. Obesity: Weight loss is imperative.  She tried the [facility] but did not find it helpful.  Not interested in bariatric surgery right now. She was not able to get coverage for semaglutide.  - I will check HgbA1c, if she is in diabetic range, she may be able to get covered for semaglutide.   Followup in 6 months  with APP  Signed, Loralie Champagne, MD  03/05/2022  [facility] [address] [facility] [place] [address] [phone] (office) [phone] (fax)

## 2022-03-05 NOTE — Telephone Encounter (Signed)
Patient called.  Patient aware. Referral time

## 2022-03-05 NOTE — Telephone Encounter (Signed)
-----   Message from Larey Dresser, MD sent at 03/05/2022  3:36 PM EDT ----- Increase K in diet.  She does have diabetes by HgbA1c.  See if she could get semaglutide coverage for weight loss/diabetes. Send referral to pharmacy clinic

## 2022-03-05 NOTE — Telephone Encounter (Signed)
Advanced Heart Failure Patient Advocate Encounter  Prior authorization is required for Jardiance 10 MG. Submitted: 03/05/2022 to [employer] Prior Auth (EOC) ID: [id number]  Clista Bernhardt, CPhT Rx Patient Advocate Phone: [phone]

## 2022-03-06 ENCOUNTER — Other Ambulatory Visit (HOSPITAL_COMMUNITY): Payer: Self-pay

## 2022-03-06 NOTE — Telephone Encounter (Signed)
Advanced Heart Failure Patient Advocate Encounter  Prior Authorization for Jardiance 10MG  has been approved.   Effective: 03/05/2022 to 03/04/2024.  Test billing returns $35 copay. Determination letter has been added to patient chart.  Clista Bernhardt, CPhT Rx Patient Advocate Phone: [phone]

## 2022-03-15 ENCOUNTER — Ambulatory Visit (HOSPITAL_COMMUNITY)
Admission: RE | Admit: 2022-03-15 | Discharge: 2022-03-15 | Disposition: A | Discharge status: Home / Self Care | Payer: Commercial Managed Care - PPO | Source: Ambulatory Visit | Attending: Cardiology | Admitting: Cardiology

## 2022-03-15 DIAGNOSIS — I5042 Chronic combined systolic (congestive) and diastolic (congestive) heart failure: Secondary | ICD-10-CM | POA: Insufficient documentation

## 2022-03-15 LAB — BASIC METABOLIC PANEL
Anion gap: 13 (ref 5–15)
BUN: 17 mg/dL (ref 6–20)
CO2: 32 mmol/L (ref 22–32)
Calcium: 8.3 mg/dL — ABNORMAL LOW (ref 8.9–10.3)
Chloride: 94 mmol/L — ABNORMAL LOW (ref 98–111)
Creatinine, Ser: 1.12 mg/dL — ABNORMAL HIGH (ref 0.44–1.00)
GFR, Estimated: >60 mL/min (ref >60)
Glucose, Bld: 175 mg/dL — ABNORMAL HIGH (ref 70–99)
Potassium: 3.7 mmol/L (ref 3.5–5.1)
Sodium: 139 mmol/L (ref 135–145)

## 2022-03-20 ENCOUNTER — Encounter: Payer: Self-pay | Admitting: Pulmonary Disease

## 2022-03-20 ENCOUNTER — Ambulatory Visit (INDEPENDENT_AMBULATORY_CARE_PROVIDER_SITE_OTHER): Payer: Commercial Managed Care - PPO | Admitting: Pulmonary Disease

## 2022-03-20 VITALS — BP 110/64 | HR 91 | Temp 98.1°F | Ht 60.0 in | Wt 233.8 lb

## 2022-03-20 DIAGNOSIS — G4733 Obstructive sleep apnea (adult) (pediatric): Secondary | ICD-10-CM

## 2022-03-20 NOTE — Patient Instructions (Signed)
I will see you back in about 6 months  Continue using your CPAP on a nightly basis  Graded activities as tolerated  Call with significant concerns

## 2022-03-20 NOTE — Progress Notes (Signed)
Subjective:    Patient ID: Meredith Diaz, female    DOB: November 19, 1980, 41 y.o.   MRN: [id number]  Patient with a history of mild obstructive obstructive sleep apnea  She stated she has been using her CPAP regularly the last 3 to 4 months Not noticing any significant change.  Energy levels she feels overall  She continues to work on losing weight  Breathing feels about the same Almost all activities are very challenging for her She is on oxygen supplementation around-the-clock  She is on Symbicort and albuterol to be used as needed   Multiple comorbidities including systolic and diastolic congestive heart failure, hypothyroidism, chronic fatigue, kyphoscoliosis -Continues to follow-up with cardiology -Last echocardiogram was stable from previous  Diagnosed with obstructive sleep apnea in 2016 Usually goes to bed about 10 PM to midnight Takes her between 10 and 30 minutes to fall asleep Rarely wakes up in the middle of the night  Final awakening time about 8 AM  Was seen in the past by by Dr. Chase Caller for possible interstitial lung disease High-resolution CT scan of the chest was not confirmatory of interstitial lung disease  PFT from 2016 did reveal severe restrictive disease Significant decrease in lung volumes also noted on CT scan of the chest Significant subcutaneous tissue, mediastinal lipomatosis  She has been referred to pulmonary rehab, referred to bariatric surgery as well     Past Medical History:  Diagnosis Date   Anxiety    Asthma    Back pain    Chronic combined systolic and diastolic CHF (congestive heart failure) ([place]) 07/2011   a. Prior Echo: EF was 25-30%; b. No Sig CAD on CATH; c. No significant finding on Cardiac MRI; d. follow-up Echo 2014 --  EF of 50%;  e. 03/2014 Echo: EF 40-45%, gr 1 DD; f. 11/2014 Echo: EF 40-45%, Gr 2 DD.   Congenital heart disease, adult    a. status post repair in childhood -- was a failure to thrive child status post  Cardec surgery (unknown details); neck MRI does not suggest any abnormal finding.   Depression    Dyspnea    Essential hypertension    Family history of breast cancer    Family history of colon cancer    Fibromyalgia    Gallbladder problem    Hypothyroidism    IBS (irritable bowel syndrome)    Morbid obesity ([place])    Nonischemic cardiomyopathy ([place])    a. unclear etiology;  b. 01/2012 Cardiac MRI: no infiltrative pathology, EF 52%;  b. 11/2014 Echo: EF 40-45%, gr 2 DD.   Scoliosis    Sleep apnea    Stomach problems    Thyroid disease    Vitamin B 12 deficiency    Vitamin D deficiency    Social History   Socioeconomic History   Marital status: Married    Spouse name: Iver Nestle   Number of children: 0   Years of education: Not on file   Highest education level: Not on file  Occupational History   Occupation: English as a second language teacher  Tobacco Use   Smoking status: Never   Smokeless tobacco: Never  Vaping Use   Vaping Use: Never used  Substance and Sexual Activity   Alcohol use: No   Drug use: No   Sexual activity: Not on file  Other Topics Concern   Not on file  Social History Narrative   Not on file   Social Determinants of Health   Financial Resource Strain: Medium Risk (08/11/2019)  Overall Financial Resource Strain (CARDIA)    Difficulty of Paying Living Expenses: Somewhat hard  Food Insecurity: Food Insecurity Present (08/11/2019)   Hunger Vital Sign    Worried About Running Out of Food in the Last Year: Sometimes true    Ran Out of Food in the Last Year: Never true  Transportation Needs: No Transportation Needs (08/11/2019)   PRAPARE - Administrator, Civil Service (Medical): No    Lack of Transportation (Non-Medical): No  Physical Activity: Inactive (08/11/2019)   Exercise Vital Sign    Days of Exercise per Week: 0 days    Minutes of Exercise per Session: 0 min  Stress: Stress Concern Present (08/11/2019)   [employer] of Occupational Health - Occupational  Stress Questionnaire    Feeling of Stress : Very much  Social Connections: Socially Isolated (08/11/2019)   Social Connection and Isolation Panel [NHANES]    Frequency of Communication with Friends and Family: Twice a week    Frequency of Social Gatherings with Friends and Family: Never    Attends Religious Services: Never    Database administrator or Organizations: No    Attends Engineer, structural: Never    Marital Status: Married  Catering manager Violence: Not on file   Family History  Problem Relation Age of Onset   Depression Mother    Alcoholism Mother    Hypertension Father    Heart disease Father    Colon cancer Maternal Grandmother 62   Heart attack Maternal Grandmother    Heart disease Maternal Grandfather    Heart attack Maternal Grandfather    Heart disease Paternal Grandmother    Breast cancer Paternal Grandmother 36       possible bilateral cancer   Asthma Paternal Grandmother    Heart attack Paternal Grandmother    Heart attack Paternal Grandfather    Breast cancer Maternal Aunt 50   Breast cancer Paternal Aunt 50   Stroke Neg Hx    Thyroid disease Neg Hx        Review of Systems  Constitutional:  Positive for fatigue. Negative for fever.  Respiratory:  Positive for apnea and shortness of breath. Negative for cough.   Genitourinary:  Negative for dysuria.  Skin:  Negative for rash.  Allergic/Immunologic: Negative.   Psychiatric/Behavioral:  Positive for sleep disturbance. Negative for dysphoric mood.        Objective:   Physical Exam Constitutional:      General: She is not in acute distress.    Appearance: She is obese.  HENT:     Head: Normocephalic and atraumatic.     Mouth/Throat:     Mouth: Mucous membranes are moist.  Eyes:     Pupils: Pupils are equal, round, and reactive to light.  Cardiovascular:     Rate and Rhythm: Normal rate and regular rhythm.     Heart sounds: No murmur heard.    No friction rub.  Pulmonary:      Effort: Pulmonary effort is normal. No respiratory distress.     Breath sounds: Normal breath sounds. No stridor. No wheezing or rhonchi.  Musculoskeletal:     Cervical back: No rigidity or tenderness.  Skin:    Coloration: Skin is not pale.  Neurological:     Mental Status: She is alert.  Psychiatric:        Mood and Affect: Mood normal.        Behavior: Behavior normal.    Vitals:   03/20/22  0851  BP: 110/64  Pulse: 91  Temp: 98.1 F (36.7 C)  SpO2: 96%       03/20/2022    8:00 AM 02/25/2019    9:00 AM 06/10/2018    2:00 PM  Results of the Epworth flowsheet  Sitting and reading 1 2 1   Watching TV 1 2 1   Sitting, inactive in a public place (e.g. a theatre or a meeting) 0 0 0  As a passenger in a car for an hour without a break 3 3 3   Lying down to rest in the afternoon when circumstances permit 3 3 3   Sitting and talking to someone 0 0 0  Sitting quietly after a lunch without alcohol 2 1 1   In a car, while stopped for a few minutes in traffic 0 0 0  Total score 10 11 9    CT scan of the chest reviewed-10/01/2018-reviewed by myself with the patient, previous CT [address] 19 also reviewed, 10/10/2016 reviewed  Pulmonary function study from 08/25/2014 reviewed showing severe restriction PFT from 04/28/2019 with severe restrictive disease  Echocardiogram from 03/11/2018 reviewed showing normal systolic function  ABG in the past with hypercapnia  CPAP compliance not available at present    Assessment & Plan:  .  Obstructive sleep apnea -Encouraged to continue using CPAP on a nightly basis -We do not have a download on her present  .  Obesity hypoventilation -The importance of weight management discussed  .  Systolic and diastolic heart failure .  Cardiomyopathy -Echocardiogram is stable -Continues to follow-up with cardiology  .  Severe restrictive lung disease -This is likely related to body habitus. -Kyphoscoliosis  -Encouraged to continue using CPAP on a nightly  basis -Continue oxygen supplementation -Weight loss efforts -  She was recently prescribed Ozempic but unable to afford the treatment secondary to the cost  .  I will see her back in about 6 months

## 2022-03-22 ENCOUNTER — Other Ambulatory Visit: Payer: Self-pay | Admitting: Pulmonary Disease

## 2022-03-28 ENCOUNTER — Other Ambulatory Visit: Payer: Self-pay | Admitting: Pulmonary Disease

## 2022-04-26 ENCOUNTER — Other Ambulatory Visit (HOSPITAL_COMMUNITY): Payer: Self-pay

## 2022-04-26 ENCOUNTER — Ambulatory Visit: Payer: Commercial Managed Care - PPO | Attending: Internal Medicine | Admitting: Pharmacist

## 2022-04-26 ENCOUNTER — Encounter: Payer: Self-pay | Admitting: Pharmacist

## 2022-04-26 VITALS — Wt 229.4 lb

## 2022-04-26 DIAGNOSIS — F418 Other specified anxiety disorders: Secondary | ICD-10-CM | POA: Diagnosis not present

## 2022-04-26 DIAGNOSIS — E118 Type 2 diabetes mellitus with unspecified complications: Secondary | ICD-10-CM | POA: Insufficient documentation

## 2022-04-26 DIAGNOSIS — E119 Type 2 diabetes mellitus without complications: Secondary | ICD-10-CM | POA: Diagnosis not present

## 2022-04-26 DIAGNOSIS — N946 Dysmenorrhea, unspecified: Secondary | ICD-10-CM | POA: Insufficient documentation

## 2022-04-26 DIAGNOSIS — R809 Proteinuria, unspecified: Secondary | ICD-10-CM | POA: Insufficient documentation

## 2022-04-26 MED ORDER — OZEMPIC (0.25 OR 0.5 MG/DOSE) 2 MG/3ML ~~LOC~~ SOPN: PEN_INJECTOR | SUBCUTANEOUS | 0 refills | Status: DC

## 2022-04-26 MED ORDER — OZEMPIC (0.25 OR 0.5 MG/DOSE) 2 MG/3ML ~~LOC~~ SOPN
0.5000 mg | PEN_INJECTOR | SUBCUTANEOUS | 1 refills | Status: DC
  Filled 2022-04-26 – 2022-06-21 (×2): qty 3, 28d supply, fill #0

## 2022-04-26 NOTE — Patient Instructions (Addendum)
It was nice meeting you today  We would like to start a new medication called Ozempic.  Your dose will be 0.25mg  once a week for 4 weeks.  On week 5 increase to 0.5mg   Concentrate on smaller portions, vegetables, lean proteins, and whole grains  I have submitted a therapy referral for you+  Please reach out with any questions or concerns  Laural Golden, PharmD, BCACP, CDCES, CPP [address] [place], [place], [address] Phone: [phone], Fax: [phone]

## 2022-04-26 NOTE — Progress Notes (Signed)
Patient ID: Waterville Carmack                 DOB: 07-28-1980                    MRN: [id number]     HPI: Meredith Diaz is a 41 y.o. female patient referred to pharmacy clinic by Dr Shirlee Latch to initiate weight loss therapy with GLP1-RA. PMH is significant for obesity, CHF, HTN, asthma, anxiety, and T2DM. Most recent BMI 6.8%.  Patient recently diagnosed diabetic on 02/23/22. Had previously been referred for weight loss consult but declined due to high insurance deductible. Ozempic was approved however.  Works for a [employer]. Husband is nurse at [employer].   Has struggled with weight gain for many years. Thinks she may have a history of a history eating disorder. Had a therapist she saw regularly but is retiring. Needs a new BH provider.  Currently on oxygen. Reports she is unable to exercise.   When asked about diet she reports she does not eat much. Has reduced sugary drinks such as soda.   Faults herself for weight gain and struggles. Became emotional in room.  Labs: Lab Results  Component Value Date   HGBA1C 6.8 (H) 03/05/2022    Wt Readings from Last 1 Encounters:  03/20/22 233 lb 12.8 oz (106.1 kg)    BP Readings from Last 1 Encounters:  03/20/22 110/64   Pulse Readings from Last 1 Encounters:  03/20/22 91       Component Value Date/Time   CHOL 204 (H) 03/05/2022 1049   CHOL 197 08/07/2017 1116   TRIG 142 03/05/2022 1049   HDL 51 03/05/2022 1049   HDL 49 08/07/2017 1116   CHOLHDL 4.0 03/05/2022 1049   VLDL 28 03/05/2022 1049   LDLCALC 125 (H) 03/05/2022 1049   LDLCALC 124 (H) 08/07/2017 1116    Past Medical History:  Diagnosis Date   Anxiety    Asthma    Back pain    Chronic combined systolic and diastolic CHF (congestive heart failure) (HCC) 07/2011   a. Prior Echo: EF was 25-30%; b. No Sig CAD on CATH; c. No significant finding on Cardiac MRI; d. follow-up Echo 2014 --  EF of 50%;  e. 03/2014 Echo: EF 40-45%, gr 1 DD; f. 11/2014 Echo: EF  40-45%, Gr 2 DD.   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   Depression    Dyspnea    Essential hypertension    Family history of breast cancer    Family history of colon cancer    Fibromyalgia    Gallbladder problem    Hypothyroidism    IBS (irritable bowel syndrome)    Morbid obesity (HCC)    Nonischemic cardiomyopathy (HCC)    a. unclear etiology;  b. 01/2012 Cardiac MRI: no infiltrative pathology, EF 52%;  b. 11/2014 Echo: EF 40-45%, gr 2 DD.   Scoliosis    Sleep apnea    Stomach problems    Thyroid disease    Vitamin B 12 deficiency    Vitamin D deficiency     Current Outpatient Medications on File Prior to Visit  Medication Sig Dispense Refill   albuterol (VENTOLIN HFA) 108 (90 Base) MCG/ACT inhaler INHALE 1-2 PUFFS BY MOUTH EVERY 6 HOURS AS NEEDED FOR WHEEZE OR SHORTNESS OF  BREATH 8.5 each 1   buPROPion (WELLBUTRIN XL) 300 MG 24 hr tablet TAKE 1 TABLET BY MOUTH EVERY DAY ANNUAL APPT DUE IN JUNE MUST SEE PROVIDER FOR REFILL 30 tablet 0   cholestyramine light (PREVALITE) 4 g packet Take 4 g by mouth as needed.     clobetasol cream (TEMOVATE) 0.05 % Apply 1 application topically See admin instructions. Apply daily to affected areas of genital area as directed     empagliflozin (JARDIANCE) 10 MG TABS tablet Take 1 tablet (10 mg total) by mouth daily before breakfast. 30 tablet 11   ipratropium-albuterol (DUONEB) 0.5-2.5 (3) MG/3ML SOLN Take 3 mLs by nebulization 2 (two) times daily as needed (For wheezing or shoortness of breath). 180 mL 5   levothyroxine (SYNTHROID) 112 MCG tablet Take 1 tablet (112 mcg total) by mouth daily. 90 tablet 3   metoprolol succinate (TOPROL-XL) 25 MG 24 hr tablet TAKE 1 TABLET BY MOUTH DAILY PLEASE KEEP FOLLOW UP APPOINTMENT 30 tablet 6   potassium chloride SA (KLOR-CON M) 20 MEQ tablet Take 1 tablet  (20 mEq total) by mouth 2 (two) times daily. 30 tablet 4   spironolactone (ALDACTONE) 25 MG tablet Take 1 tablet (25 mg total) by mouth daily. 30 tablet 4   SYMBICORT 160-4.5 MCG/ACT inhaler INHALE 2 PUFFS EVERY 12 HRS 10.2 each 11   torsemide (DEMADEX) 20 MG tablet Take 4 tablets (80 mg total) by mouth in the morning AND 3 tablets (60 mg total) every evening. Absolute last refill without office visit please call [phone] to schedule. 210 tablet 4   valsartan (DIOVAN) 80 MG tablet TAKE 1 TABLET BY MOUTH EVERY DAY PLEASE KEEP FOLLOW UP APPOINTMENT 30 tablet 6   No current facility-administered medications on file prior to visit.    Allergies  Allergen Reactions   Lisinopril Swelling and Other (See Comments)    Angioedema facial, mostly just lips, no trouble breathing     Assessment/Plan:  1. T2DM - Patient A1c 6.8 with BMI of 45 kg/m2 and is a good candidate for injectable GLP1 therapy due to obesity, DM, and CAD.   Discussed pathophysiology of T2DM and importance of diet and exercise in managing blood sugar and weight gain.   Will start Ozempic at 0.25mg  once weekly for 4 weeks before titrating to 0.5mg  weekly. Using demo pen, educated patient on mechanism of action, priming the pen, attaching needle, and administration. Will give patient 1 sample to get started and send Rx for 0.5mg  weekly. Confirmed patient not pregnant and no personal or family history of medullary thyroid carcinoma (MTC) or Multiple Endocrine Neoplasia syndrome type 2 (MEN 2).   Advised patient on common side effects including nausea, diarrhea, dyspepsia, decreased appetite, and fatigue. Counseled patient on reducing meal size and how to titrate medication to minimize side effects. Counseled patient to call if intolerable side effects or if experiencing dehydration, abdominal pain, or dizziness. Patient will adhere to dietary modifications and will target at least 150 minutes of moderate intensity exercise weekly.    Placed referral to integrated behavioral health.  Follow up in 8 weeks  Laural Golden, PharmD, BCACP, CDCES, CPP [address] [place], [place], [address] Phone: [phone], Fax: [phone]

## 2022-05-01 ENCOUNTER — Ambulatory Visit (INDEPENDENT_AMBULATORY_CARE_PROVIDER_SITE_OTHER): Payer: Commercial Managed Care - PPO

## 2022-05-01 ENCOUNTER — Ambulatory Visit (INDEPENDENT_AMBULATORY_CARE_PROVIDER_SITE_OTHER): Payer: Commercial Managed Care - PPO | Admitting: Family Medicine

## 2022-05-01 ENCOUNTER — Emergency Department (HOSPITAL_BASED_OUTPATIENT_CLINIC_OR_DEPARTMENT_OTHER): Payer: Commercial Managed Care - PPO

## 2022-05-01 ENCOUNTER — Emergency Department (HOSPITAL_BASED_OUTPATIENT_CLINIC_OR_DEPARTMENT_OTHER)
Admission: EM | Admit: 2022-05-01 | Discharge: 2022-05-01 | Disposition: A | Discharge status: Home / Self Care | Payer: Commercial Managed Care - PPO | Attending: Emergency Medicine | Admitting: Emergency Medicine

## 2022-05-01 ENCOUNTER — Encounter (HOSPITAL_BASED_OUTPATIENT_CLINIC_OR_DEPARTMENT_OTHER): Payer: Self-pay | Admitting: Emergency Medicine

## 2022-05-01 ENCOUNTER — Other Ambulatory Visit: Payer: Self-pay

## 2022-05-01 VITALS — BP 128/82 | HR 100 | Temp 98.8°F | Ht 60.0 in | Wt 234.0 lb

## 2022-05-01 DIAGNOSIS — J984 Other disorders of lung: Secondary | ICD-10-CM | POA: Diagnosis not present

## 2022-05-01 DIAGNOSIS — Z9981 Dependence on supplemental oxygen: Secondary | ICD-10-CM | POA: Diagnosis not present

## 2022-05-01 DIAGNOSIS — R0603 Acute respiratory distress: Secondary | ICD-10-CM

## 2022-05-01 DIAGNOSIS — R051 Acute cough: Secondary | ICD-10-CM

## 2022-05-01 DIAGNOSIS — R509 Fever, unspecified: Secondary | ICD-10-CM

## 2022-05-01 DIAGNOSIS — I5042 Chronic combined systolic (congestive) and diastolic (congestive) heart failure: Secondary | ICD-10-CM | POA: Diagnosis not present

## 2022-05-01 DIAGNOSIS — R062 Wheezing: Secondary | ICD-10-CM | POA: Diagnosis not present

## 2022-05-01 DIAGNOSIS — R0602 Shortness of breath: Secondary | ICD-10-CM

## 2022-05-01 DIAGNOSIS — R0902 Hypoxemia: Secondary | ICD-10-CM

## 2022-05-01 DIAGNOSIS — J9811 Atelectasis: Secondary | ICD-10-CM | POA: Insufficient documentation

## 2022-05-01 DIAGNOSIS — R0682 Tachypnea, not elsewhere classified: Secondary | ICD-10-CM

## 2022-05-01 LAB — CBC WITH DIFFERENTIAL/PLATELET
Basophils Absolute: 0 10*3/uL (ref 0.0–0.1)
Basophils Relative: 0.4 % (ref 0.0–3.0)
Eosinophils Absolute: 0.1 10*3/uL (ref 0.0–0.7)
Eosinophils Relative: 0.7 % (ref 0.0–5.0)
HCT: 34.3 % — ABNORMAL LOW (ref 36.0–46.0)
Hemoglobin: 11.5 g/dL — ABNORMAL LOW (ref 12.0–15.0)
Lymphocytes Relative: 7.9 % — ABNORMAL LOW (ref 12.0–46.0)
Lymphs Abs: 0.7 10*3/uL (ref 0.7–4.0)
MCHC: 33.5 g/dL (ref 30.0–36.0)
MCV: 89.2 fl (ref 78.0–100.0)
Monocytes Absolute: 0.9 10*3/uL (ref 0.1–1.0)
Monocytes Relative: 9.8 % (ref 3.0–12.0)
Neutro Abs: 7.3 10*3/uL (ref 1.4–7.7)
Neutrophils Relative %: 81.2 % — ABNORMAL HIGH (ref 43.0–77.0)
Platelets: 291 10*3/uL (ref 150.0–400.0)
RBC: 3.84 Mil/uL — ABNORMAL LOW (ref 3.87–5.11)
RDW: 14.2 % (ref 11.5–15.5)
WBC: 9 10*3/uL (ref 4.0–10.5)

## 2022-05-01 LAB — BRAIN NATRIURETIC PEPTIDE: Pro B Natriuretic peptide (BNP): 64 pg/mL (ref 0.0–100.0)

## 2022-05-01 LAB — COMPREHENSIVE METABOLIC PANEL
ALT: 17 U/L (ref 0–35)
AST: 20 U/L (ref 0–37)
Albumin: 3.8 g/dL (ref 3.5–5.2)
Alkaline Phosphatase: 83 U/L (ref 39–117)
BUN: 12 mg/dL (ref 6–23)
CO2: 39 mEq/L — ABNORMAL HIGH (ref 19–32)
Calcium: 9.1 mg/dL (ref 8.4–10.5)
Chloride: 88 mEq/L — ABNORMAL LOW (ref 96–112)
Creatinine, Ser: 0.86 mg/dL (ref 0.40–1.20)
GFR: 83.44 mL/min (ref >60.00)
Glucose, Bld: 187 mg/dL — ABNORMAL HIGH (ref 70–99)
Potassium: 2.9 mEq/L — ABNORMAL LOW (ref 3.5–5.1)
Sodium: 136 mEq/L (ref 135–145)
Total Bilirubin: 0.4 mg/dL (ref 0.2–1.2)
Total Protein: 7.7 g/dL (ref 6.0–8.3)

## 2022-05-01 LAB — POCT INFLUENZA A/B
Influenza A, POC: NEGATIVE
Influenza B, POC: NEGATIVE

## 2022-05-01 LAB — POC COVID19 BINAXNOW: SARS Coronavirus 2 Ag: NEGATIVE

## 2022-05-01 LAB — TROPONIN I (HIGH SENSITIVITY): High Sens Troponin I: 7 ng/L (ref 2–17)

## 2022-05-01 LAB — POCT RESPIRATORY SYNCYTIAL VIRUS: RSV Rapid Ag: NEGATIVE

## 2022-05-01 LAB — D-DIMER, QUANTITATIVE: D-Dimer, Quant: 0.93 mcg/mL FEU — ABNORMAL HIGH (ref <0.50)

## 2022-05-01 MED ORDER — ALBUTEROL SULFATE (2.5 MG/3ML) 0.083% IN NEBU: 2.5000 mg | INHALATION_SOLUTION | Freq: Four times a day (QID) | RESPIRATORY_TRACT | 12 refills | Status: AC | PRN

## 2022-05-01 MED ORDER — PREDNISONE 10 MG PO TABS: 20.0000 mg | ORAL_TABLET | Freq: Every day | ORAL | 0 refills | Status: AC

## 2022-05-01 MED ORDER — IOHEXOL 350 MG/ML SOLN
80.0000 mL | Freq: Once | INTRAVENOUS | Status: AC | PRN
  Administered 2022-05-01: 80 mL via INTRAVENOUS

## 2022-05-01 MED ORDER — AMOXICILLIN-POT CLAVULANATE 875-125 MG PO TABS: 1.0000 | ORAL_TABLET | Freq: Two times a day (BID) | ORAL | 0 refills | Status: AC

## 2022-05-01 MED ORDER — ALBUTEROL SULFATE (2.5 MG/3ML) 0.083% IN NEBU
2.5000 mg | INHALATION_SOLUTION | Freq: Once | RESPIRATORY_TRACT | Status: AC
  Administered 2022-05-01: 2.5 mg via RESPIRATORY_TRACT
  Filled 2022-05-01: qty 3

## 2022-05-01 MED ORDER — DEXAMETHASONE SODIUM PHOSPHATE 10 MG/ML IJ SOLN
10.0000 mg | Freq: Once | INTRAMUSCULAR | Status: AC
  Administered 2022-05-01: 10 mg via INTRAVENOUS
  Filled 2022-05-01: qty 1

## 2022-05-01 NOTE — ED Provider Notes (Signed)
[facility] EMERGENCY DEPT Provider Note   CSN: [id number] Arrival date & time: 05/01/22  1233     History Chief Complaint  Patient presents with   Shortness of Breath    Meredith Diaz is a 41 y.o. female.   Shortness of Breath Associated symptoms: cough and wheezing   Associated symptoms: no abdominal pain, no chest pain and no fever   Patient presented to the emergency department following onset of shortness of breath began approximately 2 days prior.  Patient was evaluated by her PCP earlier today and was negative for flu, COVID, RSV.  Patient was advised to come into the emergency department due to an elevated D-dimer that was checked by PCP and clinical symptom of shortness of breath.  Patient reports that she has a history of restrictive airway disease making respiratory infections typically little bit more difficult for her to overcome.  Patient is currently on 2 L of oxygen at home.  Denies fever, headaches, chest pain, abdominal pain, nausea, vomiting, diarrhea.     Home Medications Prior to Admission medications   Medication Sig Start Date End Date Taking? Authorizing Provider  albuterol (PROVENTIL) (2.5 MG/3ML) 0.083% nebulizer solution Take 3 mLs (2.5 mg total) by nebulization every 6 (six) hours as needed for wheezing or shortness of breath. 05/01/22  Yes Smitty Knudsen, PA-C  buPROPion (WELLBUTRIN XL) 300 MG 24 hr tablet TAKE 1 TABLET BY MOUTH EVERY DAY ANNUAL APPT DUE IN JUNE MUST SEE PROVIDER FOR REFILL 10/23/21   Myrlene Broker, MD  cholestyramine light (PREVALITE) 4 g packet Take 4 g by mouth as needed.    [provider]  clobetasol cream (TEMOVATE) 0.05 % Apply 1 application topically See admin instructions. Apply daily to affected areas of genital area as directed    [provider]  empagliflozin (JARDIANCE) 10 MG TABS tablet Take 1 tablet (10 mg total) by mouth daily before breakfast. 03/05/22   Laurey Morale, MD   levothyroxine (SYNTHROID) 112 MCG tablet Take 1 tablet (112 mcg total) by mouth daily. 10/27/19   Myrlene Broker, MD  metoprolol succinate (TOPROL-XL) 25 MG 24 hr tablet TAKE 1 TABLET BY MOUTH DAILY PLEASE KEEP FOLLOW UP APPOINTMENT 01/24/22   Laurey Morale, MD  potassium chloride SA (KLOR-CON M) 20 MEQ tablet Take 1 tablet (20 mEq total) by mouth 2 (two) times daily. 07/03/21   [place]ford, Anderson Malta, FNP  Semaglutide,0.25 or 0.5MG /DOS, (OZEMPIC, 0.25 OR 0.5 MG/DOSE,) 2 MG/3ML SOPN Inject 0.5 mg into the skin once a week for 4 weeks Patient not taking: Reported on 05/01/2022 04/26/22   Laurey Morale, MD  Semaglutide,0.25 or 0.5MG /DOS, (OZEMPIC, 0.25 OR 0.5 MG/DOSE,) 2 MG/3ML SOPN Inject 0.25mg  once weekly for 4 weeks and then increase to 0.5mg  once weekly Patient not taking: Reported on 05/01/2022 04/26/22   Laurey Morale, MD  spironolactone (ALDACTONE) 25 MG tablet Take 1 tablet (25 mg total) by mouth daily. 07/03/21   Milford, Anderson Malta, FNP  SYMBICORT 160-4.5 MCG/ACT inhaler INHALE 2 PUFFS EVERY 12 HRS 03/22/22   Olalere, Adewale A, MD  torsemide (DEMADEX) 20 MG tablet Take 4 tablets (80 mg total) by mouth in the morning AND 3 tablets (60 mg total) every evening. Absolute last refill without office visit please call [phone] to schedule. 07/03/21   Jacklynn Ganong, FNP  valsartan (DIOVAN) 80 MG tablet TAKE 1 TABLET BY MOUTH EVERY DAY PLEASE KEEP FOLLOW UP APPOINTMENT 01/24/22   Laurey Morale, MD  Allergies    Lisinopril    Review of Systems   Review of Systems  Constitutional:  Negative for appetite change, chills, fatigue and fever.  Respiratory:  Positive for cough, shortness of breath and wheezing. Negative for choking.   Cardiovascular:  Negative for chest pain and palpitations.  Gastrointestinal:  Negative for abdominal pain, diarrhea and nausea.  All other systems reviewed and are negative.   Physical Exam Updated Vital Signs BP 99/73   Pulse 97   Temp  98.8 F (37.1 C) (Oral)   Resp 18   Ht 5' (1.524 m)   Wt 106.1 kg   SpO2 95%   BMI 45.70 kg/m  Physical Exam Vitals and nursing note reviewed.  Constitutional:      Appearance: She is well-developed.  HENT:     Head: Normocephalic and atraumatic.  Cardiovascular:     Rate and Rhythm: Normal rate and regular rhythm.  Pulmonary:     Effort: Pulmonary effort is normal.     Breath sounds: Examination of the right-upper field reveals wheezing. Examination of the left-upper field reveals wheezing. Examination of the right-middle field reveals wheezing. Examination of the left-middle field reveals wheezing. Wheezing present. No decreased breath sounds or rhonchi.  Neurological:     Mental Status: She is alert.     ED Results / Procedures / Treatments   Labs (all labs ordered are listed, but only abnormal results are displayed) Labs Reviewed - No data to display  EKG EKG Interpretation  Date/Time:  Tuesday May 01 2022 13:52:35 EST Ventricular Rate:  87 PR Interval:  144 QRS Duration: 84 QT Interval:  364 QTC Calculation: 438 R Axis:   36 Text Interpretation: Normal sinus rhythm Nonspecific T wave abnormality no longer evident in Lateral leads Confirmed by Virgina Norfolk ([id number]) on 05/01/2022 7:07:24 PM  Radiology No results found.  Procedures Procedures   Medications Ordered in ED Medications  iohexol (OMNIPAQUE) 350 MG/ML injection 80 mL (80 mLs Intravenous Contrast Given 05/01/22 1643)  dexamethasone (DECADRON) injection 10 mg (10 mg Intravenous Given 05/01/22 1954)  albuterol (PROVENTIL) (2.5 MG/3ML) 0.083% nebulizer solution 2.5 mg (2.5 mg Nebulization Given 05/01/22 1949)  albuterol (PROVENTIL) (2.5 MG/3ML) 0.083% nebulizer solution 2.5 mg (2.5 mg Nebulization Given 05/01/22 2039)    ED Course/ Medical Decision Making/ A&P                           Medical Decision Making Amount and/or Complexity of Data Reviewed Radiology: ordered.  Risk Prescription  drug management.   This patient presents to the ED for concern of shortness of breath. differential diagnosis includes pneumonia, bronchitis, viral URI, restrictive lung disease    Imaging Studies ordered:  I ordered imaging studies including CT chest angiogram I independently visualized and interpreted imaging which showed no evidence of a pulmonary embolism.  Mild atelectasis noted in the left basilar I agree with the radiologist interpretation   Medicines ordered and prescription drug management:  I ordered medication including Decadron, nebulized albuterol for shortness of breath Reevaluation of the patient after these medicines showed that the patient improved I have reviewed the patients home medicines and have made adjustments as needed   Problem List / ED Course:  Patient presented to the emergency department complaints of shortness of breath.  PCP checked D-dimer showing elevated read and was advised to come to the ER for further evaluation.  CT angio was negative for any signs of a PE.  Based on clinical picture, therapeutic interventions were performed while patient was here in the ER including nebulized albuterol and Decadron which did appear to improve patient's work of breathing.  Patient was comfortable discharging home was agreeable to this plan.  Encourage patient to return to the emergency department she continues to have difficulty breathing that is difficult to manage with albuterol at home.  Patient verbalized understanding and did not have any further questions out of the assessment.     Final Clinical Impression(s) / ED Diagnoses Final diagnoses:  Shortness of breath  Atelectasis    Rx / DC Orders ED Discharge Orders          Ordered    amoxicillin-clavulanate (AUGMENTIN) 875-125 MG tablet  Every 12 hours        05/01/22 2112    albuterol (PROVENTIL) (2.5 MG/3ML) 0.083% nebulizer solution  Every 6 hours PRN        05/01/22 2112    predniSONE  (DELTASONE) 10 MG tablet  Daily        05/01/22 2112              Smitty Knudsen, PA-C 05/08/22 1209    Virgina Norfolk, DO 05/10/22 1448

## 2022-05-01 NOTE — ED Triage Notes (Signed)
Patient states she was over at her doctors and sent here for further evaluation of shortness of breath, sinus pain, scratchy throat and cough.

## 2022-05-01 NOTE — Progress Notes (Signed)
Discussed in person. EMS transported patient to the ED due to acute respiratory distress

## 2022-05-01 NOTE — Discharge Instructions (Addendum)
You are seen in the emergency department with complaints of shortness of breath.  Based on your initial presentation and reassuring vital signs as well as a negative CT angiogram of your lungs negative for pulmonary embolism, believe that you are safe to discharge home with a short course of a prednisone burst they will take for 5 days.  She continue to use any home oxygen as needed and use albuterol inhalers to improve the work of breathing.  If you have any concerns about excessive difficulty breathing please return back to the emergency department for further evaluation.

## 2022-05-01 NOTE — Progress Notes (Signed)
Subjective: Chief Complaint  Patient presents with   Shortness of Breath    Woke up yesterday with sore throat, is feeling fatigued. Really bad wheezing and cough with SOB. Fever on and off last night. Nasal congestion.      Meredith Diaz is a 41 y.o. female who presents for fever, fatigue, ST, cough, wheezing, shortness of breath since yesterday.  She has a history of severe restrictive lung disease.  Followed by pulmonology She is on oxygen supplementation around-the-clock. Uses CPAP nightly.    She is on Symbicort and albuterol to be used as needed. Has been out of nebulizer solution.   PMH is significant for obesity, OSA, CHF, HTN, asthma, anxiety, and T2DM.   Denies palpitations, N/V/D.   - Echo (10/23): EF 50-55%, normal RV, normal IVC.   ROS as in subjective.   Objective: Vitals:   05/01/22 1026 05/01/22 1108  BP: 124/86 128/82  Pulse: (!) 105 100  Temp: 98.8 F (37.1 C)   SpO2: 90% 96%    General appearance: Alert, WD/WN, acute distress, ill appearing                             Skin: warm, no rash                                       Mouth/throat: MMM                           Neck: supple, no adenopathy, no thyromegaly, nontender                          Heart: tachycardic                          Lungs: bilateral wheezing, scattered rhonchi and decreased air movement, worse on right. Speaking in fragmented sentences. Tachypnea, use of accessory muscles.       Assessment: Acute respiratory distress  Fever, unspecified fever cause - Plan: POC COVID-19, POCT respiratory syncytial virus, POCT Influenza A/B, CBC with Differential/Platelet, Comprehensive metabolic panel, DG Chest 2 View, Comprehensive metabolic panel, CBC with Differential/Platelet  Shortness of breath - Plan: POC COVID-19, POCT respiratory syncytial virus, POCT Influenza A/B, CBC with Differential/Platelet, Comprehensive metabolic panel, Troponin I (High Sensitivity), D-Dimer,  Quantitative, Brain natriuretic peptide, DG Chest 2 View, Brain natriuretic peptide, D-Dimer, Quantitative, Troponin I (High Sensitivity), Comprehensive metabolic panel, CBC with Differential/Platelet  Supplemental oxygen dependent  Acute cough - Plan: POC COVID-19, POCT respiratory syncytial virus, POCT Influenza A/B, CBC with Differential/Platelet, Brain natriuretic peptide, DG Chest 2 View, Brain natriuretic peptide, CBC with Differential/Platelet  Restrictive lung disease - Plan: DG Chest 2 View  Chronic combined systolic and diastolic heart failure, NYHA class 3 (HCC) - Plan: Troponin I (High Sensitivity), Brain natriuretic peptide, DG Chest 2 View, Brain natriuretic peptide, Troponin I (High Sensitivity)  Tachypnea  Hypoxia  CLINICAL DATA:  Shortness of breath   EXAM: CHEST - 2 VIEW   COMPARISON:  10/01/2018   FINDINGS: Cardiomegaly. Streaky left basilar opacity. No pleural effusion or pneumothorax. 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   IMPRESSION: Streaky left basilar opacity, atelectasis versus infiltrate.     Electronically Signed   By: Duanne Guess D.O.  On: 05/01/2022 11:11  Plan: Negative Covid, flu and RSV in office.  Stat CXR and labs ordered.  Reviewed notes from cardiologist and pulmonologist  She is in distress. Pulse oximeter between 87-93% on 2 L [place].   Desats with any type of exertion.  EMS called to transport to the ED for further evaluation. She is at risk of life threatening illness and rapid decompensation.

## 2022-05-01 NOTE — ED Notes (Signed)
Patient had chest x-ray, swab and lab work done at doctors office today and able to see results in chart.

## 2022-05-03 ENCOUNTER — Telehealth: Payer: Self-pay

## 2022-05-03 NOTE — Telephone Encounter (Signed)
Transition Care Management Follow-up Telephone Call Date of discharge and from where: 05/01/2022 @ [facility] How have you been since you were released from the hospital? Pt states that she is feeling alitte bit better today  Any questions or concerns? No  Items Reviewed: Did the pt receive and understand the discharge instructions provided? Yes  Medications obtained and verified? Yes  Other? No  Any new allergies since your discharge? No  Dietary orders reviewed? No Do you have support at home? Yes   Home Care and Equipment/Supplies: Were home health services ordered? no If so, what is the name of the agency? N/a  Has the agency set up a time to come to the patient's home? no Were any new equipment or medical supplies ordered?  No What is the name of the medical supply agency? N/a Were you able to get the supplies/equipment? no Do you have any questions related to the use of the equipment or supplies? No  Functional Questionnaire: (I = Independent and D = Dependent) ADLs: I  Bathing/Dressing- I  Meal Prep- I  Eating- I  Maintaining continence- I  Transferring/Ambulation- I  Managing Meds- I  Follow up appointments reviewed:  PCP Hospital f/u appt confirmed? Yes  Scheduled to see Dr Okey Dupre on 05/16/2022 @ 9:00am. Specialist Hospital f/u appt confirmed? No  Scheduled to see n/a on n/a @ n/a. Are transportation arrangements needed? No  If their condition worsens, is the pt aware to call PCP or go to the Emergency Dept.? Yes Was the patient provided with contact information for the PCP's office or ED? Yes Was to pt encouraged to call back with questions or concerns? Yes

## 2022-05-12 ENCOUNTER — Other Ambulatory Visit (HOSPITAL_COMMUNITY): Payer: Self-pay

## 2022-05-14 ENCOUNTER — Ambulatory Visit (INDEPENDENT_AMBULATORY_CARE_PROVIDER_SITE_OTHER): Payer: Commercial Managed Care - PPO | Admitting: Psychologist

## 2022-05-14 DIAGNOSIS — F411 Generalized anxiety disorder: Secondary | ICD-10-CM

## 2022-05-14 DIAGNOSIS — F330 Major depressive disorder, recurrent, mild: Secondary | ICD-10-CM | POA: Diagnosis not present
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
## 2022-05-14 NOTE — Progress Notes (Signed)
                Airyana Sprunger, PsyD 

## 2022-05-16 ENCOUNTER — Ambulatory Visit (INDEPENDENT_AMBULATORY_CARE_PROVIDER_SITE_OTHER): Payer: Commercial Managed Care - PPO | Admitting: Internal Medicine

## 2022-05-16 ENCOUNTER — Encounter: Payer: Self-pay | Admitting: Internal Medicine

## 2022-05-16 VITALS — BP 100/80 | HR 92 | Temp 98.1°F | Ht 60.0 in | Wt 227.0 lb

## 2022-05-16 DIAGNOSIS — E785 Hyperlipidemia, unspecified: Secondary | ICD-10-CM

## 2022-05-16 DIAGNOSIS — E1169 Type 2 diabetes mellitus with other specified complication: Secondary | ICD-10-CM

## 2022-05-16 DIAGNOSIS — E039 Hypothyroidism, unspecified: Secondary | ICD-10-CM

## 2022-05-16 DIAGNOSIS — F418 Other specified anxiety disorders: Secondary | ICD-10-CM

## 2022-05-16 DIAGNOSIS — R0902 Hypoxemia: Secondary | ICD-10-CM

## 2022-05-16 DIAGNOSIS — E118 Type 2 diabetes mellitus with unspecified complications: Secondary | ICD-10-CM | POA: Diagnosis not present

## 2022-05-16 LAB — COMPREHENSIVE METABOLIC PANEL
ALT: 18 U/L (ref 0–35)
AST: 14 U/L (ref 0–37)
Albumin: 3.8 g/dL (ref 3.5–5.2)
Alkaline Phosphatase: 84 U/L (ref 39–117)
BUN: 15 mg/dL (ref 6–23)
CO2: 34 mEq/L — ABNORMAL HIGH (ref 19–32)
Calcium: 9 mg/dL (ref 8.4–10.5)
Chloride: 92 mEq/L — ABNORMAL LOW (ref 96–112)
Creatinine, Ser: 0.92 mg/dL (ref 0.40–1.20)
GFR: 76.93 mL/min (ref >60.00)
Glucose, Bld: 209 mg/dL — ABNORMAL HIGH (ref 70–99)
Potassium: 3.7 mEq/L (ref 3.5–5.1)
Sodium: 138 mEq/L (ref 135–145)
Total Bilirubin: 0.6 mg/dL (ref 0.2–1.2)
Total Protein: 7.8 g/dL (ref 6.0–8.3)

## 2022-05-16 LAB — MICROALBUMIN / CREATININE URINE RATIO
Creatinine,U: 321.2 mg/dL
Microalb Creat Ratio: 1.1 mg/g (ref 0.0–30.0)
Microalb, Ur: 3.4 mg/dL — ABNORMAL HIGH (ref 0.0–1.9)

## 2022-05-16 LAB — CBC
HCT: 38.2 % (ref 36.0–46.0)
Hemoglobin: 12.8 g/dL (ref 12.0–15.0)
MCHC: 33.4 g/dL (ref 30.0–36.0)
MCV: 88.5 fl (ref 78.0–100.0)
Platelets: 317 10*3/uL (ref 150.0–400.0)
RBC: 4.32 Mil/uL (ref 3.87–5.11)
RDW: 14 % (ref 11.5–15.5)
WBC: 12.6 10*3/uL — ABNORMAL HIGH (ref 4.0–10.5)

## 2022-05-16 LAB — TSH: TSH: 5.84 u[IU]/mL — ABNORMAL HIGH (ref 0.35–5.50)

## 2022-05-16 LAB — T4, FREE: Free T4: 1.02 ng/dL (ref 0.60–1.60)

## 2022-05-16 MED ORDER — LEVOTHYROXINE SODIUM 112 MCG PO TABS: 112.0000 ug | ORAL_TABLET | Freq: Every day | ORAL | 3 refills | Status: DC

## 2022-05-16 MED ORDER — BUPROPION HCL ER (XL) 300 MG PO TB24: 300.0000 mg | ORAL_TABLET | Freq: Every day | ORAL | 3 refills | Status: DC

## 2022-05-16 NOTE — Progress Notes (Unsigned)
   Subjective:   Patient ID: Meredith Diaz, female    DOB: Sep 07, 1980, 42 y.o.   MRN: [id number]  HPI The patient is a 42 YO female coming in for follow up ER visit and ongoing care. Some changes in health since last visit including new dx diabetes.   Review of Systems  Constitutional:  Positive for activity change, appetite change and fatigue.  HENT: Negative.    Eyes: Negative.   Respiratory:  Positive for cough, chest tightness and shortness of breath.   Cardiovascular:  Negative for chest pain, palpitations and leg swelling.  Gastrointestinal:  Negative for abdominal distention, abdominal pain, constipation, diarrhea, nausea and vomiting.  Musculoskeletal:  Positive for arthralgias and myalgias.  Skin: Negative.   Neurological: Negative.   Psychiatric/Behavioral:  Positive for decreased concentration and dysphoric mood. The patient is nervous/anxious.     Objective:  Physical Exam Constitutional:      Appearance: She is well-developed. She is obese.  HENT:     Head: Normocephalic and atraumatic.  Cardiovascular:     Rate and Rhythm: Normal rate and regular rhythm.  Pulmonary:     Effort: Pulmonary effort is normal. No respiratory distress.     Breath sounds: No wheezing or rales.     Comments: Oxygen in place 2 L Abdominal:     General: Bowel sounds are normal. There is no distension.     Palpations: Abdomen is soft.     Tenderness: There is no abdominal tenderness. There is no rebound.  Musculoskeletal:     Cervical back: Normal range of motion.  Skin:    General: Skin is warm and dry.  Neurological:     Mental Status: She is alert and oriented to person, place, and time.     Coordination: Coordination normal.     Vitals:   05/16/22 0858  BP: 100/80  Pulse: 92  Temp: 98.1 F (36.7 C)  TempSrc: Oral  SpO2: 93%  Weight: 227 lb (103 kg)  Height: 5' (1.524 m)    Assessment & Plan:  Visit time 35 minutes in face to face communication with patient and  coordination of care, additional 15 minutes spent in record review, coordination or care, ordering tests, communicating/referring to other healthcare professionals, documenting in medical records all on the same day of the visit for total time 50 minutes spent on the visit.

## 2022-05-16 NOTE — Assessment & Plan Note (Signed)
No recent levels, checking TSH and free T4 and adjust synthroid 112 mcg daily as needed.

## 2022-05-16 NOTE — Patient Instructions (Signed)
We will check the labs today. 

## 2022-05-16 NOTE — Assessment & Plan Note (Signed)
Foot exam done and counseled about need for eye exam. Taking jardiance 10 mg daily (for heart failure) and starting ozempic 0.25 mg weekly and then following standard titration to help with weight loss and sugars. She is on ARB. Not on statin. Checking CBC, CMP, microalbumin to creatinine ratio.

## 2022-05-16 NOTE — Assessment & Plan Note (Signed)
Worse overall recently and just started counseling.

## 2022-05-16 NOTE — Assessment & Plan Note (Signed)
Still using 3 L oxygen at home 2L with concentrator when she leaves home. This is more oxygen than baseline due to cold and advised in 2-3 week try to resume normal amounts.

## 2022-05-16 NOTE — Assessment & Plan Note (Signed)
Recent LDL 122 and she is working on weight loss starting ozempic in 1-2 weeks. Will defer statin for now. Address again at follow up in 3-6 months.

## 2022-05-17 ENCOUNTER — Encounter: Payer: Self-pay | Admitting: Internal Medicine

## 2022-05-17 NOTE — Assessment & Plan Note (Signed)
Long discussion about weight and the impact on her thoracic cavity. She is starting ozempic and counseled about standard titration and likely expectations for treatment. Follow up 3-6 months

## 2022-05-29 ENCOUNTER — Ambulatory Visit (INDEPENDENT_AMBULATORY_CARE_PROVIDER_SITE_OTHER): Payer: Commercial Managed Care - PPO | Admitting: Psychologist

## 2022-05-29 ENCOUNTER — Ambulatory Visit: Payer: Commercial Managed Care - PPO | Admitting: Internal Medicine

## 2022-05-29 DIAGNOSIS — F330 Major depressive disorder, recurrent, mild: Secondary | ICD-10-CM

## 2022-05-29 DIAGNOSIS — F411 Generalized anxiety disorder: Secondary | ICD-10-CM

## 2022-05-29 NOTE — Progress Notes (Signed)
                Jolaine Fryberger, PsyD 

## 2022-05-29 NOTE — Progress Notes (Signed)
[facility] Counselor/Therapist Progress Note  Patient ID: Meredith Diaz, MRN: [id number],    Date: 05/29/2022  Time Spent: 09:03 am to 09:45 am; total time: 42 minutes   This session was held via in person. The patient consented to in-person therapy and was in the clinician's office. Limits of confidentiality were discussed with the patient.    Treatment Type: Individual Therapy  Reported Symptoms: NA  Mental Status Exam: Appearance:  Well Groomed     Behavior: Appropriate  Motor: Normal  Speech/Language:  Clear and Coherent  Affect: Appropriate  Mood: normal  Thought process: normal  Thought content:   WNL  Sensory/Perceptual disturbances:   WNL  Orientation: oriented to person, place, and time/date  Attention: Good  Concentration: Good  Memory: WNL  Fund of knowledge:  Good  Insight:   Good  Judgment:  Good  Impulse Control: Good   Risk Assessment: Danger to Self:  No Self-injurious Behavior: No Danger to Others: No Duty to Warn:no Physical Aggression / Violence:No  Access to Firearms a concern: No  Gang Involvement:No   Subjective: Beginning the session, patient described herself as doing poorly. Elaborating, she reflected on multiple stressors she is experiencing. Patient then asked for coping skills. Patient participated in coping strategies. From there, she voiced some apprehension related to whether they will assist or not. She processed thoughts and emotions. She was agreeable to attempting strategies at home. She was agreeable to following up. She denied suicidal and homicidal ideation.    Interventions:  Worked on developing a therapeutic relationship with the patient using active listening and reflective statements. Normalized and validated expressed thoughts and emotions. Reviewed the treatment plan with the patient. Reviewed events since the last session. Normalized and validated thoughts and emotions. Identified goals for the session.  Provided psychoeducation about mindfulness and worry box. Practiced and processed mindfulness and a worry box. Normalized and validated concerns. Challenged some of the thoughts expressed. Used metaphors to assist the patient. Processed thoughts and emotions. Provided empathic statements. Assigned homework. Assessed for suicidal and homicidal ideation.   Homework: Implement coping skills  Next Session: Review homework, defusion, PMR, and emotional support.   Diagnosis: F41.1 generalized anxiety disorder and F33.0 major depressive affective disorder, recurrent, mild   Plan:  Goals Work through the grieving process and face reality of own death Accept emotional support from others around them Live life to the fullest, event though time may be limited Become as knowledgeable about the medical condition  Reduce fear, anxiety about the health condition  Accept the illness Accept the role of psychological and behavioral factors  Stabilize anxiety level wile increasing ability to function Learn and implement coping skills that result in a reduction of anxiety  Alleviate depressive symptoms Recognize, accept, and cope with depressive feelings Develop healthy thinking patterns Develop healthy interpersonal relationships  Objectives target date for all objectives is 05/14/2022 Identify feelings associated with the illness Family members share with each other feelings Identify the losses or limitations that have been experienced Verbalize acceptance of the reality of the medical condition Commit to learning and implement a proactive approach to managing personal stresses Verbalize an understanding of the medical condition Work with therapist to develop a plan for coping with stress Learn and implement skills for managing stress Engage in social, productive activities that are possible Engage in faith based activities implement positive imagery Identify coping skills and sources of emotional  support Patient's partner and family members verbalize their fears regarding severity of health  condition Identify sources of emotional distress  Learning and implement calming skills to reduce overall anxiety Learn and implement problem solving strategies Identify and engage in pleasant activities Learning and implement personal and interpersonal skills to reduce anxiety and improve interpersonal relationships Learn to accept limitations in life and commit to tolerating, rather than avoiding, unpleasant emotions while accomplishing meaningful goals Identify major life conflicts from the past and present that form the basis for present anxiety Learn and implement behavioral strategies Verbalize an understanding and resolution of current interpersonal problems Learn and implement problem solving and decision making skills Learn and implement conflict resolution skills to resolve interpersonal problems Verbalize an understanding of healthy and unhealthy emotions verbalize insight into how past relationships may be influence current experiences with depression Use mindfulness and acceptance strategies and increase value based behavior  Increase hopeful statements about the future.   Interventions Teach about stress and ways to handle stress Assist the patient in developing a coping action plan for stressors Conduct skills based training for coping strategies Train problem focused skills Sort out what activities the individual can do Encourage patient to rely upon his/her spiritual faith Teach the patient to use guided imagery Probe and evaluate family's ability to provide emotional support Allow family to share their fears Assist the patient in identifying, sorting through, and verbalizing the various feelings generated by his/her medical condition Meet with family members  Ask patient list out limitations  Use stress inoculation training  Use Acceptance and Commitment Therapy to help  client accept uncomfortable realities in order to accomplish value-consistent goals Reinforce the client's insight into the role of his/her past emotional pain and present anxiety  Discuss examples demonstrating that unrealistic worry overestimates the probability of threats and underestimate patient's ability  Assist the patient in analyzing his or her worries Help patient understand that avoidance is reinforcing  Behavioral activation help the client explore the relationship, nature of the dispute,  Help the client develop new interpersonal skills and relationships Conduct Problem so living therapy Teach conflict resolution skills Use a process-experiential approach Conduct TLDP Conduct ACT  The patient and clinician reviewed the treatment plan on 05/29/2022. The patient agreed with the treatment plan.    Conception Chancy, PsyD

## 2022-06-01 ENCOUNTER — Other Ambulatory Visit: Payer: Self-pay | Admitting: Pulmonary Disease

## 2022-06-04 ENCOUNTER — Ambulatory Visit (INDEPENDENT_AMBULATORY_CARE_PROVIDER_SITE_OTHER): Payer: Commercial Managed Care - PPO | Admitting: Psychologist

## 2022-06-04 ENCOUNTER — Ambulatory Visit: Payer: Commercial Managed Care - PPO | Admitting: Psychologist

## 2022-06-04 DIAGNOSIS — F411 Generalized anxiety disorder: Secondary | ICD-10-CM

## 2022-06-04 DIAGNOSIS — F330 Major depressive disorder, recurrent, mild: Secondary | ICD-10-CM

## 2022-06-04 NOTE — Progress Notes (Signed)
[place] Counselor/Therapist Progress Note  Patient ID: Mckena Chern, MRN: [id number],    Date: 06/04/2022  Time Spent: 11:05 am to 11:43 am; total time: 38 minutes   This session was held via in person. The patient consented to in-person therapy and was in the clinician's office. Limits of confidentiality were discussed with the patient.    Treatment Type: Individual Therapy  Reported Symptoms: Anxiety  Mental Status Exam: Appearance:  Well Groomed     Behavior: Appropriate  Motor: Normal  Speech/Language:  Clear and Coherent  Affect: Appropriate  Mood: normal  Thought process: normal  Thought content:   WNL  Sensory/Perceptual disturbances:   WNL  Orientation: oriented to person, place, and time/date  Attention: Good  Concentration: Good  Memory: WNL  Fund of knowledge:  Good  Insight:   Good  Judgment:  Good  Impulse Control: Good   Risk Assessment: Danger to Self:  No Self-injurious Behavior: No Danger to Others: No Duty to Warn:no Physical Aggression / Violence:No  Access to Firearms a concern: No  Gang Involvement:No   Subjective: Beginning the session, patient described herself as doing slightly better and that she has noticed a slight change since implementing coping strategies. From there, she spent the session exploring, processing, and reflecting on the etiology of the anxiety that she experiences. She processed thoughts and emotions. She was agreeable to following up. She denied suicidal and homicidal ideation.    Interventions:  Worked on developing a therapeutic relationship with the patient using active listening and reflective statements. Normalized and validated expressed thoughts and emotions. Reviewed the treatment plan with the patient. Praised the patient for implementing the skills and normalized the responses that patient has experienced. Identified goals for the session. Used socratic questions to assist the patient.  Explored and processed the etiology of the anxiety that she experiences. Processed relationships with her parents growing up. Validated her experience. Provided psychoeducation about individuals who experience personality disorders. Challenged some of the thoughts expressed. Processed the idea of moving forward with life. Used a Product/process development scientist to assist the patient. Provided empathic statements. Assigned homework. Assessed for suicidal and homicidal ideation.   Homework: Continue using coping skills. Reflected on letting go of the old chapter in her life.   Next Session: Review homework, and continue to process finding a new identity and moving forward  Diagnosis: F41.1 generalized anxiety disorder and F33.0 major depressive affective disorder, recurrent, mild   Plan:  Goals Work through the grieving process and face reality of own death Accept emotional support from others around them Live life to the fullest, event though time may be limited Become as knowledgeable about the medical condition  Reduce fear, anxiety about the health condition  Accept the illness Accept the role of psychological and behavioral factors  Stabilize anxiety level wile increasing ability to function Learn and implement coping skills that result in a reduction of anxiety  Alleviate depressive symptoms Recognize, accept, and cope with depressive feelings Develop healthy thinking patterns Develop healthy interpersonal relationships  Objectives target date for all objectives is 05/14/2022 Identify feelings associated with the illness Family members share with each other feelings Identify the losses or limitations that have been experienced Verbalize acceptance of the reality of the medical condition Commit to learning and implement a proactive approach to managing personal stresses Verbalize an understanding of the medical condition Work with therapist to develop a plan for coping with stress Learn and implement skills  for managing stress Engage in social,  productive activities that are possible Engage in faith based activities implement positive imagery Identify coping skills and sources of emotional support Patient's partner and family members verbalize their fears regarding severity of health condition Identify sources of emotional distress  Learning and implement calming skills to reduce overall anxiety Learn and implement problem solving strategies Identify and engage in pleasant activities Learning and implement personal and interpersonal skills to reduce anxiety and improve interpersonal relationships Learn to accept limitations in life and commit to tolerating, rather than avoiding, unpleasant emotions while accomplishing meaningful goals Identify major life conflicts from the past and present that form the basis for present anxiety Learn and implement behavioral strategies Verbalize an understanding and resolution of current interpersonal problems Learn and implement problem solving and decision making skills Learn and implement conflict resolution skills to resolve interpersonal problems Verbalize an understanding of healthy and unhealthy emotions verbalize insight into how past relationships may be influence current experiences with depression Use mindfulness and acceptance strategies and increase value based behavior  Increase hopeful statements about the future.   Interventions Teach about stress and ways to handle stress Assist the patient in developing a coping action plan for stressors Conduct skills based training for coping strategies Train problem focused skills Sort out what activities the individual can do Encourage patient to rely upon his/her spiritual faith Teach the patient to use guided imagery Probe and evaluate family's ability to provide emotional support Allow family to share their fears Assist the patient in identifying, sorting through, and verbalizing the various  feelings generated by his/her medical condition Meet with family members  Ask patient list out limitations  Use stress inoculation training  Use Acceptance and Commitment Therapy to help client accept uncomfortable realities in order to accomplish value-consistent goals Reinforce the client's insight into the role of his/her past emotional pain and present anxiety  Discuss examples demonstrating that unrealistic worry overestimates the probability of threats and underestimate patient's ability  Assist the patient in analyzing his or her worries Help patient understand that avoidance is reinforcing  Behavioral activation help the client explore the relationship, nature of the dispute,  Help the client develop new interpersonal skills and relationships Conduct Problem so living therapy Teach conflict resolution skills Use a process-experiential approach Conduct TLDP Conduct ACT  The patient and clinician reviewed the treatment plan on 05/29/2022. The patient agreed with the treatment plan.    Conception Chancy, PsyD

## 2022-06-08 ENCOUNTER — Telehealth: Payer: Self-pay | Admitting: Pulmonary Disease

## 2022-06-08 MED ORDER — ALBUTEROL SULFATE HFA 108 (90 BASE) MCG/ACT IN AERS: 2.0000 | INHALATION_SPRAY | Freq: Four times a day (QID) | RESPIRATORY_TRACT | 6 refills | Status: DC | PRN

## 2022-06-08 NOTE — Telephone Encounter (Signed)
Called and spoke to patient and verified that she was needing her albuterol refilled. Made sure it was the inhaler. Verified pharmacy with patient on the phone. Refills sent. Nothing further needed

## 2022-06-12 ENCOUNTER — Encounter: Payer: Self-pay | Admitting: Pharmacist

## 2022-06-18 ENCOUNTER — Ambulatory Visit (INDEPENDENT_AMBULATORY_CARE_PROVIDER_SITE_OTHER): Payer: Commercial Managed Care - PPO | Admitting: Psychologist

## 2022-06-18 DIAGNOSIS — F411 Generalized anxiety disorder: Secondary | ICD-10-CM

## 2022-06-18 DIAGNOSIS — F330 Major depressive disorder, recurrent, mild: Secondary | ICD-10-CM | POA: Diagnosis not present

## 2022-06-18 NOTE — Progress Notes (Signed)
[place] Counselor/Therapist Progress Note  Patient ID: Meredith Diaz, MRN: [id number],    Date: 06/18/2022  Time Spent: 09:03 am to 09:43 am; total time: 40 minutes  This session was held via video webex teletherapy due to the coronavirus risk at this time. The patient consented to video teletherapy and was located at her home during this session. She is aware it is the responsibility of the patient to secure confidentiality on her end of the session. The provider was in a private home office for the duration of this session. Limits of confidentiality were discussed with the patient.   Treatment Type: Individual Therapy  Reported Symptoms: Anxiety  Mental Status Exam: Appearance:  Well Groomed     Behavior: Appropriate  Motor: Normal  Speech/Language:  Clear and Coherent  Affect: Appropriate  Mood: normal  Thought process: normal  Thought content:   WNL  Sensory/Perceptual disturbances:   WNL  Orientation: oriented to person, place, and time/date  Attention: Good  Concentration: Good  Memory: WNL  Fund of knowledge:  Good  Insight:   Good  Judgment:  Good  Impulse Control: Good   Risk Assessment: Danger to Self:  No Self-injurious Behavior: No Danger to Others: No Duty to Warn:no Physical Aggression / Violence:No  Access to Firearms a concern: No  Gang Involvement:No   Subjective: Beginning the session, patient described herself as okay voicing concern over the intensity of anxiety that she experiences. She reflected on her concerns and the origin of the anxiety that she experiences. She processed her thoughts and emotions regarding her concerns. She was agreeable to following up. She denied suicidal and homicidal ideation.    Interventions:  Worked on developing a therapeutic relationship with the patient using active listening and reflective statements. Reviewed events since the last session. Normalized and validated expressed thoughts and  emotions. Identified goals for the session. Challenged some of the thoughts expressed. Normalized experiencing anxiety. Processed how anxiety might be serving a purpose. Processed how patient's brain may function different due to previous life experiences. Used scoratic questions to assist the patient gain insight into self. Explored the idea of a psychiatry refer due to patient's concern over intensity of anxiety. Provided empathic statements. Assigned homework. Assessed for suicidal and homicidal ideation.   Homework: Reflect on whether or not the anxiety is serving a purpose  Next Session: Review homework, and emotional support  Diagnosis: F41.1 generalized anxiety disorder and F33.0 major depressive affective disorder, recurrent, mild   Plan:  Goals Work through the grieving process and face reality of own death Accept emotional support from others around them Live life to the fullest, event though time may be limited Become as knowledgeable about the medical condition  Reduce fear, anxiety about the health condition  Accept the illness Accept the role of psychological and behavioral factors  Stabilize anxiety level wile increasing ability to function Learn and implement coping skills that result in a reduction of anxiety  Alleviate depressive symptoms Recognize, accept, and cope with depressive feelings Develop healthy thinking patterns Develop healthy interpersonal relationships  Objectives target date for all objectives is 05/14/2022 Identify feelings associated with the illness Family members share with each other feelings Identify the losses or limitations that have been experienced Verbalize acceptance of the reality of the medical condition Commit to learning and implement a proactive approach to managing personal stresses Verbalize an understanding of the medical condition Work with therapist to develop a plan for coping with stress Learn and implement skills  for managing  stress Engage in social, productive activities that are possible Engage in faith based activities implement positive imagery Identify coping skills and sources of emotional support Patient's partner and family members verbalize their fears regarding severity of health condition Identify sources of emotional distress  Learning and implement calming skills to reduce overall anxiety Learn and implement problem solving strategies Identify and engage in pleasant activities Learning and implement personal and interpersonal skills to reduce anxiety and improve interpersonal relationships Learn to accept limitations in life and commit to tolerating, rather than avoiding, unpleasant emotions while accomplishing meaningful goals Identify major life conflicts from the past and present that form the basis for present anxiety Learn and implement behavioral strategies Verbalize an understanding and resolution of current interpersonal problems Learn and implement problem solving and decision making skills Learn and implement conflict resolution skills to resolve interpersonal problems Verbalize an understanding of healthy and unhealthy emotions verbalize insight into how past relationships may be influence current experiences with depression Use mindfulness and acceptance strategies and increase value based behavior  Increase hopeful statements about the future.   Interventions Teach about stress and ways to handle stress Assist the patient in developing a coping action plan for stressors Conduct skills based training for coping strategies Train problem focused skills Sort out what activities the individual can do Encourage patient to rely upon his/her spiritual faith Teach the patient to use guided imagery Probe and evaluate family's ability to provide emotional support Allow family to share their fears Assist the patient in identifying, sorting through, and verbalizing the various feelings  generated by his/her medical condition Meet with family members  Ask patient list out limitations  Use stress inoculation training  Use Acceptance and Commitment Therapy to help client accept uncomfortable realities in order to accomplish value-consistent goals Reinforce the client's insight into the role of his/her past emotional pain and present anxiety  Discuss examples demonstrating that unrealistic worry overestimates the probability of threats and underestimate patient's ability  Assist the patient in analyzing his or her worries Help patient understand that avoidance is reinforcing  Behavioral activation help the client explore the relationship, nature of the dispute,  Help the client develop new interpersonal skills and relationships Conduct Problem so living therapy Teach conflict resolution skills Use a process-experiential approach Conduct TLDP Conduct ACT  The patient and clinician reviewed the treatment plan on 05/29/2022. The patient agreed with the treatment plan.    Conception Chancy, PsyD

## 2022-06-21 ENCOUNTER — Ambulatory Visit: Payer: Commercial Managed Care - PPO | Attending: Cardiology | Admitting: Pharmacist

## 2022-06-21 ENCOUNTER — Encounter: Payer: Self-pay | Admitting: Pharmacist

## 2022-06-21 ENCOUNTER — Other Ambulatory Visit (HOSPITAL_COMMUNITY): Payer: Self-pay

## 2022-06-21 VITALS — Ht 60.0 in | Wt 222.6 lb

## 2022-06-21 DIAGNOSIS — E118 Type 2 diabetes mellitus with unspecified complications: Secondary | ICD-10-CM | POA: Diagnosis not present

## 2022-06-21 NOTE — Patient Instructions (Signed)
It was good seeing you again  I recommend increasing your Ozempic to 0.81m once a week  Continue watching your meal intake. Concentrate on vegetables, lean proteins, and whole grains  Continue following up with Dr SMichail Sermonand CSharlet Salina Please message me when you are ready to increase your dose  CKarren Cobble PharmD, BWann CEast [place] [id number]: [phone] Fax: [phone]

## 2022-06-21 NOTE — Progress Notes (Signed)
Patient ID: Meredith Diaz                 DOB: 1980-07-18                    MRN: [id number]     HPI: Meredith Diaz is a 42 y.o. female patient referred to pharmacy clinic by Meredith Diaz to initiate weight loss therapy with GLP1-RA. PMH is significant for obesity, CHF, T2DM, OSA, HTN, and HLD. Most recent BMI 44.33 kg/m2.  Last A1c 6.8% on 03/05/22.  Baseline weight and BMI: 234#, 45.7 kg/m2 Current weight and BMI: 222.6, 43.47 kg/m2  Current meds that affect weight: Torsemide, levothyroxine, spironolactone, Jardiance  Patient presents today for DM and weight loss follow up. Started Ozempic later than expected due to diagnosis of pneumonia over Christmas.  Reports occasional nausea but reports it can occur in the mornings when she has not eaten or after eating. Is not snacking as much anymore. Is not sure if her portion sizes have decreased.  Due to pick up 0.61m dose today and will start this weekend.  Is not having any CHF symptoms but is concerned that she is not urinating as much on her torsemide.  [facility]  Labs: Lab Results  Component Value Date   HGBA1C 6.8 (H) 03/05/2022    Wt Readings from Last 1 Encounters:  05/16/22 227 lb (103 kg)    BP Readings from Last 1 Encounters:  05/16/22 100/80   Pulse Readings from Last 1 Encounters:  05/16/22 92       Component Value Date/Time   CHOL 204 (H) 03/05/2022 1049   CHOL 197 08/07/2017 1116   TRIG 142 03/05/2022 1049   HDL 51 03/05/2022 1049   HDL 49 08/07/2017 1116   CHOLHDL 4.0 03/05/2022 1049   VLDL 28 03/05/2022 1049   LDLCALC 125 (H) 03/05/2022 1049   LDLCALC 124 (H) 08/07/2017 1116    Past Medical History:  Diagnosis Date   Anxiety    Asthma    Back pain    Chronic combined systolic and diastolic CHF (congestive heart failure) ([place]   a. Prior Echo: EF was 25-30%; b. No Sig CAD on CATH; c. No significant finding on Cardiac MRI; d. follow-up  Echo 2014 --  EF of 50%;  e. 03/2014 Echo: EF 40-45%, gr 1 DD; f. 11/2014 Echo: EF 40-45%, Gr 2 DD.   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   Depression    Dyspnea    Essential hypertension    Family history of breast cancer    Family history of colon cancer    Fibromyalgia    Gallbladder problem    Hypothyroidism    IBS (irritable bowel syndrome)    Morbid obesity ([place]    Nonischemic cardiomyopathy ([place]    a. unclear etiology;  b. 01/2012 Cardiac MRI: no infiltrative pathology, EF 52%;  b. 11/2014 Echo: EF 40-45%, gr 2 DD.   Scoliosis    Sleep apnea    Stomach problems    Thyroid disease    Vitamin B 12 deficiency    Vitamin D deficiency     Current Outpatient Medications on File Prior to Visit  Medication Sig Dispense Refill   albuterol (VENTOLIN HFA) 108 (90 Base) MCG/ACT inhaler  Inhale 2 puffs into the lungs every 6 (six) hours as needed for wheezing or shortness of breath. 8 g 6   albuterol (PROVENTIL) (2.5 MG/3ML) 0.083% nebulizer solution Take 3 mLs (2.5 mg total) by nebulization every 6 (six) hours as needed for wheezing or shortness of breath. 75 mL 12   buPROPion (WELLBUTRIN XL) 300 MG 24 hr tablet Take 1 tablet (300 mg total) by mouth daily. 90 tablet 3   cholestyramine light (PREVALITE) 4 g packet Take 4 g by mouth as needed.     clobetasol cream (TEMOVATE) [id number] % Apply 1 application topically See admin instructions. Apply daily to affected areas of genital area as directed     empagliflozin (JARDIANCE) 10 MG TABS tablet Take 1 tablet (10 mg total) by mouth daily before breakfast. 30 tablet 11   levothyroxine (SYNTHROID) 112 MCG tablet Take 1 tablet (112 mcg total) by mouth daily. 90 tablet 3   metoprolol succinate (TOPROL-XL) 25 MG 24 hr tablet TAKE 1 TABLET BY MOUTH DAILY PLEASE KEEP FOLLOW UP APPOINTMENT 30 tablet  6   potassium chloride SA (KLOR-CON M) 20 MEQ tablet Take 1 tablet (20 mEq total) by mouth 2 (two) times daily. 30 tablet 4   Semaglutide,0.25 or 0.5MG/DOS, (OZEMPIC, 0.25 OR 0.5 MG/DOSE,) 2 MG/3ML SOPN Inject 0.5 mg into the skin once a week for 4 weeks 3 mL 1   Semaglutide,0.25 or 0.5MG/DOS, (OZEMPIC, 0.25 OR 0.5 MG/DOSE,) 2 MG/3ML SOPN Inject 0.38m once weekly for 4 weeks and then increase to 0.547monce weekly 3 mL 0   spironolactone (ALDACTONE) 25 MG tablet Take 1 tablet (25 mg total) by mouth daily. 30 tablet 4   SYMBICORT 160-4.5 MCG/ACT inhaler INHALE 2 PUFFS EVERY 12 HRS 10.2 each 11   torsemide (DEMADEX) 20 MG tablet Take 4 tablets (80 mg total) by mouth in the morning AND 3 tablets (60 mg total) every evening. Absolute last refill without office visit please call [phone] schedule. 210 tablet 4   valsartan (DIOVAN) 80 MG tablet TAKE 1 TABLET BY MOUTH EVERY DAY PLEASE KEEP FOLLOW UP APPOINTMENT 30 tablet 6   No current facility-administered medications on file prior to visit.    Allergies  Allergen Reactions   Lisinopril Swelling and Other (See Comments)    Angioedema facial, mostly just lips, no trouble breathing     Assessment/Plan:  1. Weight loss/DM - Patient has lost 12# since starting Ozempic but is not sure if that is due to diuretic use. Advised it was unlikely the semaglutide would cause her torsemide to be less effective but to keep Meredith Diaz in the loop.  Advised that nausea is likely due to overeating and portion sizes and also can be caused by high fat diets. Recommended vegetables, lean proteins, and whole grains. Continue to follow with Meredith Diaz Patient to begin Ozempic 0.5m96mnce weekly this weekend. Advised to contact office when she was ready for dose increase. Pt voiced understanding.  Meredith Diaz, BCACP, CDCRogers CityPPMorriceuiLecomptee[place]ior[id number]: [phone]: [phone]

## 2022-06-27 ENCOUNTER — Ambulatory Visit (INDEPENDENT_AMBULATORY_CARE_PROVIDER_SITE_OTHER): Payer: Commercial Managed Care - PPO | Admitting: Psychologist

## 2022-06-27 DIAGNOSIS — F330 Major depressive disorder, recurrent, mild: Secondary | ICD-10-CM | POA: Diagnosis not present

## 2022-06-27 DIAGNOSIS — F411 Generalized anxiety disorder: Secondary | ICD-10-CM | POA: Diagnosis not present

## 2022-06-27 NOTE — Progress Notes (Signed)
[facility] Counselor/Therapist Progress Note  Patient ID: Katlynn Penuelas, MRN: [id number],    Date: 06/27/2022  Time Spent: 09:05 am to 09:47 am; total time: 42 minutes  This session was held via in person. The patient consented to in-person therapy and was in the clinician's office. Limits of confidentiality were discussed with the patient.    Treatment Type: Individual Therapy  Reported Symptoms: Anxiety and depression secondary to interaction with medical team related to weight  Mental Status Exam: Appearance:  Well Groomed     Behavior: Appropriate  Motor: Normal  Speech/Language:  Clear and Coherent  Affect: Appropriate  Mood: normal  Thought process: normal  Thought content:   WNL  Sensory/Perceptual disturbances:   WNL  Orientation: oriented to person, place, and time/date  Attention: Good  Concentration: Good  Memory: WNL  Fund of knowledge:  Good  Insight:   Good  Judgment:  Good  Impulse Control: Good   Risk Assessment: Danger to Self:  No Self-injurious Behavior: No Danger to Others: No Duty to Warn:no Physical Aggression / Violence:No  Access to Firearms a concern: No  Gang Involvement:No   Subjective: Beginning the session, patient described herself as okay indicating that she is packing up boxes as the plan is to move this weekend. From there, she initially began to reflect on limitations she experiences carrying around oxygen. Continuing to talk, patient spent the session reflecting on challenging conversations with medical providers who present with weight stigma and weight bias. She processed thoughts and emotions. She was agreeable to following up. She denied suicidal and homicidal ideation.  Interventions:  Worked on developing a therapeutic relationship with the patient using active listening and reflective statements. Reviewed events since the last session. Normalized and validated expressed thoughts and emotions. Identified goals  for the session. Praised the patient for doing okay. Processed thoughts related to moving. Normalized and validated expressed thoughts. Used socratic questions to assist the patient. Challenged some of the thoughts expressed. Identified the themes of weight bias and weight stigma in the medical field. Processed thoughts and emotions. Provided empathic statements. Assessed for suicidal and homicidal ideation.   Homework: NA  Next Session: Reflect on being kind to self regarding aspects of health that are out of patient's control  Diagnosis: F41.1 generalized anxiety disorder and F33.0 major depressive affective disorder, recurrent, mild   Plan:  Goals Work through the grieving process and face reality of own death Accept emotional support from others around them Live life to the fullest, event though time may be limited Become as knowledgeable about the medical condition  Reduce fear, anxiety about the health condition  Accept the illness Accept the role of psychological and behavioral factors  Stabilize anxiety level wile increasing ability to function Learn and implement coping skills that result in a reduction of anxiety  Alleviate depressive symptoms Recognize, accept, and cope with depressive feelings Develop healthy thinking patterns Develop healthy interpersonal relationships  Objectives target date for all objectives is 05/14/2022 Identify feelings associated with the illness Family members share with each other feelings Identify the losses or limitations that have been experienced Verbalize acceptance of the reality of the medical condition Commit to learning and implement a proactive approach to managing personal stresses Verbalize an understanding of the medical condition Work with therapist to develop a plan for coping with stress Learn and implement skills for managing stress Engage in social, productive activities that are possible Engage in faith based  activities implement positive imagery Identify coping  skills and sources of emotional support Patient's partner and family members verbalize their fears regarding severity of health condition Identify sources of emotional distress  Learning and implement calming skills to reduce overall anxiety Learn and implement problem solving strategies Identify and engage in pleasant activities Learning and implement personal and interpersonal skills to reduce anxiety and improve interpersonal relationships Learn to accept limitations in life and commit to tolerating, rather than avoiding, unpleasant emotions while accomplishing meaningful goals Identify major life conflicts from the past and present that form the basis for present anxiety Learn and implement behavioral strategies Verbalize an understanding and resolution of current interpersonal problems Learn and implement problem solving and decision making skills Learn and implement conflict resolution skills to resolve interpersonal problems Verbalize an understanding of healthy and unhealthy emotions verbalize insight into how past relationships may be influence current experiences with depression Use mindfulness and acceptance strategies and increase value based behavior  Increase hopeful statements about the future.   Interventions Teach about stress and ways to handle stress Assist the patient in developing a coping action plan for stressors Conduct skills based training for coping strategies Train problem focused skills Sort out what activities the individual can do Encourage patient to rely upon his/her spiritual faith Teach the patient to use guided imagery Probe and evaluate family's ability to provide emotional support Allow family to share their fears Assist the patient in identifying, sorting through, and verbalizing the various feelings generated by his/her medical condition Meet with family members  Ask patient list out  limitations  Use stress inoculation training  Use Acceptance and Commitment Therapy to help client accept uncomfortable realities in order to accomplish value-consistent goals Reinforce the client's insight into the role of his/her past emotional pain and present anxiety  Discuss examples demonstrating that unrealistic worry overestimates the probability of threats and underestimate patient's ability  Assist the patient in analyzing his or her worries Help patient understand that avoidance is reinforcing  Behavioral activation help the client explore the relationship, nature of the dispute,  Help the client develop new interpersonal skills and relationships Conduct Problem so living therapy Teach conflict resolution skills Use a process-experiential approach Conduct TLDP Conduct ACT  The patient and clinician reviewed the treatment plan on 05/29/2022. The patient agreed with the treatment plan.    Conception Chancy, PsyD

## 2022-07-04 ENCOUNTER — Ambulatory Visit (INDEPENDENT_AMBULATORY_CARE_PROVIDER_SITE_OTHER): Payer: Commercial Managed Care - PPO | Admitting: Psychologist

## 2022-07-04 DIAGNOSIS — F330 Major depressive disorder, recurrent, mild: Secondary | ICD-10-CM

## 2022-07-04 DIAGNOSIS — F411 Generalized anxiety disorder: Secondary | ICD-10-CM

## 2022-07-04 NOTE — Progress Notes (Signed)
[facility] Counselor/Therapist Progress Note  Patient ID: Meredith Diaz, MRN: [id number],    Date: 07/04/2022  Time Spent: 09:04 am to 09:42 am; total time: 38 minutes  This session was held via in person. The patient consented to in-person therapy and was in the clinician's office. Limits of confidentiality were discussed with the patient.    Treatment Type: Individual Therapy  Reported Symptoms: Some distress related to balance in relationship with spouse  Mental Status Exam: Appearance:  Well Groomed     Behavior: Appropriate  Motor: Normal  Speech/Language:  Clear and Coherent  Affect: Appropriate  Mood: normal  Thought process: normal  Thought content:   WNL  Sensory/Perceptual disturbances:   WNL  Orientation: oriented to person, place, and time/date  Attention: Good  Concentration: Good  Memory: WNL  Fund of knowledge:  Good  Insight:   Good  Judgment:  Good  Impulse Control: Good   Risk Assessment: Danger to Self:  No Self-injurious Behavior: No Danger to Others: No Duty to Warn:no Physical Aggression / Violence:No  Access to Firearms a concern: No  Gang Involvement:No   Subjective: Beginning the session, patient described herself as well while reflecting on the move from the weekend and now having to unpack. From there, patient identified several different topics and primarily spent time reflecting on having a balance of responsibilities in relationship with spouse. She processed thoughts and emotions. She asked to follow up. She denied suicidal and homicidal ideation.   Interventions:  Worked on developing a therapeutic relationship with the patient using active listening and reflective statements. Reviewed events since the last session. Normalized and validated expressed thoughts and emotions. Reviewed how the moving process went with the patient. Normalized thoughts expressed. Identified several different topics to discuss. Narrowed down  the discussion to a balance of responsibilities in relationships. Normalized thoughts and emotions. Used socratic questions to assist the patient. Used metaphors and examples to assist the patient. Challenged some of the thoughts expressed. Explored the idea of having a conversation with spouse regarding concerns. Assisted in problem solving. Assisted in identifying communication styles. Explored different ways to communicate concerns to spouse. Provided empathic statements. Assessed for suicidal and homicidal ideation.   Homework: NA  Next Session: Emotional support  Diagnosis: F41.1 generalized anxiety disorder and F33.0 major depressive affective disorder, recurrent, mild   Plan:  Goals Work through the grieving process and face reality of own death Accept emotional support from others around them Live life to the fullest, event though time may be limited Become as knowledgeable about the medical condition  Reduce fear, anxiety about the health condition  Accept the illness Accept the role of psychological and behavioral factors  Stabilize anxiety level wile increasing ability to function Learn and implement coping skills that result in a reduction of anxiety  Alleviate depressive symptoms Recognize, accept, and cope with depressive feelings Develop healthy thinking patterns Develop healthy interpersonal relationships  Objectives target date for all objectives is 05/14/2022 Identify feelings associated with the illness Family members share with each other feelings Identify the losses or limitations that have been experienced Verbalize acceptance of the reality of the medical condition Commit to learning and implement a proactive approach to managing personal stresses Verbalize an understanding of the medical condition Work with therapist to develop a plan for coping with stress Learn and implement skills for managing stress Engage in social, productive activities that are  possible Engage in faith based activities implement positive imagery Identify coping skills and  sources of emotional support Patient's partner and family members verbalize their fears regarding severity of health condition Identify sources of emotional distress  Learning and implement calming skills to reduce overall anxiety Learn and implement problem solving strategies Identify and engage in pleasant activities Learning and implement personal and interpersonal skills to reduce anxiety and improve interpersonal relationships Learn to accept limitations in life and commit to tolerating, rather than avoiding, unpleasant emotions while accomplishing meaningful goals Identify major life conflicts from the past and present that form the basis for present anxiety Learn and implement behavioral strategies Verbalize an understanding and resolution of current interpersonal problems Learn and implement problem solving and decision making skills Learn and implement conflict resolution skills to resolve interpersonal problems Verbalize an understanding of healthy and unhealthy emotions verbalize insight into how past relationships may be influence current experiences with depression Use mindfulness and acceptance strategies and increase value based behavior  Increase hopeful statements about the future.   Interventions Teach about stress and ways to handle stress Assist the patient in developing a coping action plan for stressors Conduct skills based training for coping strategies Train problem focused skills Sort out what activities the individual can do Encourage patient to rely upon his/her spiritual faith Teach the patient to use guided imagery Probe and evaluate family's ability to provide emotional support Allow family to share their fears Assist the patient in identifying, sorting through, and verbalizing the various feelings generated by his/her medical condition Meet with family members   Ask patient list out limitations  Use stress inoculation training  Use Acceptance and Commitment Therapy to help client accept uncomfortable realities in order to accomplish value-consistent goals Reinforce the client's insight into the role of his/her past emotional pain and present anxiety  Discuss examples demonstrating that unrealistic worry overestimates the probability of threats and underestimate patient's ability  Assist the patient in analyzing his or her worries Help patient understand that avoidance is reinforcing  Behavioral activation help the client explore the relationship, nature of the dispute,  Help the client develop new interpersonal skills and relationships Conduct Problem so living therapy Teach conflict resolution skills Use a process-experiential approach Conduct TLDP Conduct ACT  The patient and clinician reviewed the treatment plan on 05/29/2022. The patient agreed with the treatment plan.    Conception Chancy, PsyD

## 2022-07-08 ENCOUNTER — Other Ambulatory Visit (HOSPITAL_COMMUNITY): Payer: Self-pay | Admitting: Family Medicine

## 2022-07-12 ENCOUNTER — Ambulatory Visit (INDEPENDENT_AMBULATORY_CARE_PROVIDER_SITE_OTHER): Payer: Commercial Managed Care - PPO | Admitting: Psychologist

## 2022-07-12 DIAGNOSIS — F411 Generalized anxiety disorder: Secondary | ICD-10-CM | POA: Diagnosis not present

## 2022-07-12 DIAGNOSIS — F330 Major depressive disorder, recurrent, mild: Secondary | ICD-10-CM

## 2022-07-12 NOTE — Progress Notes (Signed)
[facility] Counselor/Therapist Progress Note  Patient ID: Meredith Diaz, MRN: [id number],    Date: 07/12/2022  Time Spent: 09:04 am to 09:42 am; total time: 38 minutes  This session was held via in person. The patient consented to in-person therapy and was in the clinician's office. Limits of confidentiality were discussed with the patient.    Treatment Type: Individual Therapy  Reported Symptoms: Distress related to being different from others and having limitations  Mental Status Exam: Appearance:  Well Groomed     Behavior: Appropriate  Motor: Normal  Speech/Language:  Clear and Coherent  Affect: Appropriate  Mood: normal  Thought process: normal  Thought content:   WNL  Sensory/Perceptual disturbances:   WNL  Orientation: oriented to person, place, and time/date  Attention: Good  Concentration: Good  Memory: WNL  Fund of knowledge:  Good  Insight:   Good  Judgment:  Good  Impulse Control: Good   Risk Assessment: Danger to Self:  No Self-injurious Behavior: No Danger to Others: No Duty to Warn:no Physical Aggression / Violence:No  Access to Firearms a concern: No  Gang Involvement:No   Subjective: Beginning the session, patient described herself as okay acknowledging that she may have pushed too hard. Patient spent the session processing and reflecting on the internal struggles she experiences with being different due to her physical health. Patient described self as having guilt despite knowing that some of what has occurred to her body is out of her control. She processed thoughts and emotions. She stated understanding regarding the transition. She asked to follow up. She denied suicidal and homicidal ideation.   Interventions:  Worked on developing a therapeutic relationship with the patient using active listening and reflective statements. Reviewed events since the last session. Normalized and validated expressed thoughts and emotions. Reflected  on events since the last session. Normalized and validated thoughts and emotions. Identified goals for the session. Identified the theme of being different. Used socratic questions to assist the patient. Challenged some of the thoughts expressed by the patient. Looked for evidence to support the patient's current beliefs. Challenged some of the beliefs. Attempted to assist with developing a SMART goal to assist the patient. Processed thoughts and emotions. Normalized thoughts. Disclosed to patient regarding the transition. Discussed next steps. Provided empathic statements. Assessed for suicidal and homicidal ideation.   Homework: NA  Next Session: Attempt to get out of the house more consistently  Diagnosis: F41.1 generalized anxiety disorder and F33.0 major depressive affective disorder, recurrent, mild   Plan:  Goals Work through the grieving process and face reality of own death Accept emotional support from others around them Live life to the fullest, event though time may be limited Become as knowledgeable about the medical condition  Reduce fear, anxiety about the health condition  Accept the illness Accept the role of psychological and behavioral factors  Stabilize anxiety level wile increasing ability to function Learn and implement coping skills that result in a reduction of anxiety  Alleviate depressive symptoms Recognize, accept, and cope with depressive feelings Develop healthy thinking patterns Develop healthy interpersonal relationships  Objectives target date for all objectives is 05/14/2022 Identify feelings associated with the illness Family members share with each other feelings Identify the losses or limitations that have been experienced Verbalize acceptance of the reality of the medical condition Commit to learning and implement a proactive approach to managing personal stresses Verbalize an understanding of the medical condition Work with therapist to develop a  plan for coping  with stress Learn and implement skills for managing stress Engage in social, productive activities that are possible Engage in faith based activities implement positive imagery Identify coping skills and sources of emotional support Patient's partner and family members verbalize their fears regarding severity of health condition Identify sources of emotional distress  Learning and implement calming skills to reduce overall anxiety Learn and implement problem solving strategies Identify and engage in pleasant activities Learning and implement personal and interpersonal skills to reduce anxiety and improve interpersonal relationships Learn to accept limitations in life and commit to tolerating, rather than avoiding, unpleasant emotions while accomplishing meaningful goals Identify major life conflicts from the past and present that form the basis for present anxiety Learn and implement behavioral strategies Verbalize an understanding and resolution of current interpersonal problems Learn and implement problem solving and decision making skills Learn and implement conflict resolution skills to resolve interpersonal problems Verbalize an understanding of healthy and unhealthy emotions verbalize insight into how past relationships may be influence current experiences with depression Use mindfulness and acceptance strategies and increase value based behavior  Increase hopeful statements about the future.   Interventions Teach about stress and ways to handle stress Assist the patient in developing a coping action plan for stressors Conduct skills based training for coping strategies Train problem focused skills Sort out what activities the individual can do Encourage patient to rely upon his/her spiritual faith Teach the patient to use guided imagery Probe and evaluate family's ability to provide emotional support Allow family to share their fears Assist the patient in  identifying, sorting through, and verbalizing the various feelings generated by his/her medical condition Meet with family members  Ask patient list out limitations  Use stress inoculation training  Use Acceptance and Commitment Therapy to help client accept uncomfortable realities in order to accomplish value-consistent goals Reinforce the client's insight into the role of his/her past emotional pain and present anxiety  Discuss examples demonstrating that unrealistic worry overestimates the probability of threats and underestimate patient's ability  Assist the patient in analyzing his or her worries Help patient understand that avoidance is reinforcing  Behavioral activation help the client explore the relationship, nature of the dispute,  Help the client develop new interpersonal skills and relationships Conduct Problem so living therapy Teach conflict resolution skills Use a process-experiential approach Conduct TLDP Conduct ACT  The patient and clinician reviewed the treatment plan on 05/29/2022. The patient agreed with the treatment plan.    Conception Chancy, PsyD

## 2022-07-16 ENCOUNTER — Ambulatory Visit (INDEPENDENT_AMBULATORY_CARE_PROVIDER_SITE_OTHER): Payer: Commercial Managed Care - PPO | Admitting: Psychologist

## 2022-07-16 DIAGNOSIS — F411 Generalized anxiety disorder: Secondary | ICD-10-CM

## 2022-07-16 DIAGNOSIS — F330 Major depressive disorder, recurrent, mild: Secondary | ICD-10-CM

## 2022-07-16 NOTE — Progress Notes (Signed)
[facility] Counselor/Therapist Progress Note  Patient ID: Meredith Diaz, MRN: [id number],    Date: 07/16/2022  Time Spent: 11:07 am to 11:47 am; total time: 40 minutes  This session was held via in person. The patient consented to in-person therapy and was in the clinician's office. Limits of confidentiality were discussed with the patient.    Treatment Type: Individual Therapy  Reported Symptoms: Distress due to inability to participate in certain activities  Mental Status Exam: Appearance:  Well Groomed     Behavior: Appropriate  Motor: Normal  Speech/Language:  Clear and Coherent  Affect: Appropriate  Mood: normal  Thought process: normal  Thought content:   WNL  Sensory/Perceptual disturbances:   WNL  Orientation: oriented to person, place, and time/date  Attention: Good  Concentration: Good  Memory: WNL  Fund of knowledge:  Good  Insight:   Good  Judgment:  Good  Impulse Control: Good   Risk Assessment: Danger to Self:  No Self-injurious Behavior: No Danger to Others: No Duty to Warn:no Physical Aggression / Violence:No  Access to Firearms a concern: No  Gang Involvement:No   Subjective: Beginning the session, patient described herself as fair indicating that she has an upcoming wedding to attend. She voiced that she has gotten out of the home more consistently since the last appointment. She spent the session reflecting on the limitations of what she can't do and how others perceive her inability to not do tasks. Patient ended the session stating that she also has a need to understand what caused/who is responsible for her situation. She asked to follow up. She denied suicidal and homicidal ideation.   Interventions:  Worked on developing a therapeutic relationship with the patient using active listening and reflective statements. Reviewed events since the last session. Normalized and validated expressed thoughts and emotions. Praised the  patient for being able to get out and participate in activities of the weekend. Processed thoughts and emotions with become more active. Briefly processed thoughts and emotions related to upcoming wedding. Normalized the brain's response to problem solving. Used socratic questions to assist the patient. Challenged some of the thoughts expressed. Reflected on some of the fears patient experiences. Attempted to assist with problem solving. Processed thoughts and emotions. Reminded patient of the transition. Provided empathic statements. Assessed for suicidal and homicidal ideation.   Homework: NA  Next Session: Reflect on the need to always hold someone responsible for current state  Diagnosis: F41.1 generalized anxiety disorder and F33.0 major depressive affective disorder, recurrent, mild   Plan:  Goals Work through the grieving process and face reality of own death Accept emotional support from others around them Live life to the fullest, event though time may be limited Become as knowledgeable about the medical condition  Reduce fear, anxiety about the health condition  Accept the illness Accept the role of psychological and behavioral factors  Stabilize anxiety level wile increasing ability to function Learn and implement coping skills that result in a reduction of anxiety  Alleviate depressive symptoms Recognize, accept, and cope with depressive feelings Develop healthy thinking patterns Develop healthy interpersonal relationships  Objectives target date for all objectives is 05/14/2022 Identify feelings associated with the illness Family members share with each other feelings Identify the losses or limitations that have been experienced Verbalize acceptance of the reality of the medical condition Commit to learning and implement a proactive approach to managing personal stresses Verbalize an understanding of the medical condition Work with therapist to develop a plan  for coping with  stress Learn and implement skills for managing stress Engage in social, productive activities that are possible Engage in faith based activities implement positive imagery Identify coping skills and sources of emotional support Patient's partner and family members verbalize their fears regarding severity of health condition Identify sources of emotional distress  Learning and implement calming skills to reduce overall anxiety Learn and implement problem solving strategies Identify and engage in pleasant activities Learning and implement personal and interpersonal skills to reduce anxiety and improve interpersonal relationships Learn to accept limitations in life and commit to tolerating, rather than avoiding, unpleasant emotions while accomplishing meaningful goals Identify major life conflicts from the past and present that form the basis for present anxiety Learn and implement behavioral strategies Verbalize an understanding and resolution of current interpersonal problems Learn and implement problem solving and decision making skills Learn and implement conflict resolution skills to resolve interpersonal problems Verbalize an understanding of healthy and unhealthy emotions verbalize insight into how past relationships may be influence current experiences with depression Use mindfulness and acceptance strategies and increase value based behavior  Increase hopeful statements about the future.   Interventions Teach about stress and ways to handle stress Assist the patient in developing a coping action plan for stressors Conduct skills based training for coping strategies Train problem focused skills Sort out what activities the individual can do Encourage patient to rely upon his/her spiritual faith Teach the patient to use guided imagery Probe and evaluate family's ability to provide emotional support Allow family to share their fears Assist the patient in identifying, sorting  through, and verbalizing the various feelings generated by his/her medical condition Meet with family members  Ask patient list out limitations  Use stress inoculation training  Use Acceptance and Commitment Therapy to help client accept uncomfortable realities in order to accomplish value-consistent goals Reinforce the client's insight into the role of his/her past emotional pain and present anxiety  Discuss examples demonstrating that unrealistic worry overestimates the probability of threats and underestimate patient's ability  Assist the patient in analyzing his or her worries Help patient understand that avoidance is reinforcing  Behavioral activation help the client explore the relationship, nature of the dispute,  Help the client develop new interpersonal skills and relationships Conduct Problem so living therapy Teach conflict resolution skills Use a process-experiential approach Conduct TLDP Conduct ACT  The patient and clinician reviewed the treatment plan on 05/29/2022. The patient agreed with the treatment plan.    Conception Chancy, PsyD

## 2022-07-23 ENCOUNTER — Ambulatory Visit (INDEPENDENT_AMBULATORY_CARE_PROVIDER_SITE_OTHER): Payer: Commercial Managed Care - PPO | Admitting: Psychologist

## 2022-07-23 DIAGNOSIS — F411 Generalized anxiety disorder: Secondary | ICD-10-CM

## 2022-07-23 DIAGNOSIS — F330 Major depressive disorder, recurrent, mild: Secondary | ICD-10-CM | POA: Diagnosis not present

## 2022-07-23 NOTE — Progress Notes (Signed)
[facility] Counselor/Therapist Progress Note  Patient ID: Meredith Diaz, MRN: [id number],    Date: 07/23/2022  Time Spent: 10:05 am to 10:45 am; total time:40 minutes  This session was held via in person. The patient consented to in-person therapy and was in the clinician's office. Limits of confidentiality were discussed with the patient.    Treatment Type: Individual Therapy  Reported Symptoms: Distress due to people not asking about health  Mental Status Exam: Appearance:  Well Groomed     Behavior: Appropriate  Motor: Normal  Speech/Language:  Clear and Coherent  Affect: Appropriate  Mood: normal  Thought process: normal  Thought content:   WNL  Sensory/Perceptual disturbances:   WNL  Orientation: oriented to person, place, and time/date  Attention: Good  Concentration: Good  Memory: WNL  Fund of knowledge:  Good  Insight:   Good  Judgment:  Good  Impulse Control: Good   Risk Assessment: Danger to Self:  No Self-injurious Behavior: No Danger to Others: No Duty to Warn:no Physical Aggression / Violence:No  Access to Firearms a concern: No  Gang Involvement:No   Subjective: Beginning the session, patient described herself as doing well while talking about the weekend. Elaborating, she voiced having some distress when people do not ask about her health. Patient spent the session reflecting on the double bind she puts people in and the origin of the challenges she experiences. She was receptive for information related to EMDR. She asked to follow up. She denied suicidal and homicidal ideation.   Interventions:  Worked on developing a therapeutic relationship with the patient using active listening and reflective statements. Reviewed events since the last session. Normalized and validated expressed thoughts and emotions. Reviewed the events from the weekend. Praised the patient for doing well. Identified goals for the session. Used socratic questions  to assist the patient gain insight into self. Challenged some of the thoughts. Pointed out discrepancies expressed. Used metaphors and analogies to assist the patient. Provided psychoeducation about EMDR. Provided empathic statements. Assessed for suicidal and homicidal ideation.   Homework: Review the information regarding EMDR and reflect on whether she wants to consider EMDR  Next Session: Review homework and emotional support  Diagnosis: F41.1 generalized anxiety disorder and F33.0 major depressive affective disorder, recurrent, mild   Plan:  Goals Work through the grieving process and face reality of own death Accept emotional support from others around them Live life to the fullest, event though time may be limited Become as knowledgeable about the medical condition  Reduce fear, anxiety about the health condition  Accept the illness Accept the role of psychological and behavioral factors  Stabilize anxiety level wile increasing ability to function Learn and implement coping skills that result in a reduction of anxiety  Alleviate depressive symptoms Recognize, accept, and cope with depressive feelings Develop healthy thinking patterns Develop healthy interpersonal relationships  Objectives target date for all objectives is 05/14/2022 Identify feelings associated with the illness Family members share with each other feelings Identify the losses or limitations that have been experienced Verbalize acceptance of the reality of the medical condition Commit to learning and implement a proactive approach to managing personal stresses Verbalize an understanding of the medical condition Work with therapist to develop a plan for coping with stress Learn and implement skills for managing stress Engage in social, productive activities that are possible Engage in faith based activities implement positive imagery Identify coping skills and sources of emotional support Patient's partner and  family members verbalize  their fears regarding severity of health condition Identify sources of emotional distress  Learning and implement calming skills to reduce overall anxiety Learn and implement problem solving strategies Identify and engage in pleasant activities Learning and implement personal and interpersonal skills to reduce anxiety and improve interpersonal relationships Learn to accept limitations in life and commit to tolerating, rather than avoiding, unpleasant emotions while accomplishing meaningful goals Identify major life conflicts from the past and present that form the basis for present anxiety Learn and implement behavioral strategies Verbalize an understanding and resolution of current interpersonal problems Learn and implement problem solving and decision making skills Learn and implement conflict resolution skills to resolve interpersonal problems Verbalize an understanding of healthy and unhealthy emotions verbalize insight into how past relationships may be influence current experiences with depression Use mindfulness and acceptance strategies and increase value based behavior  Increase hopeful statements about the future.   Interventions Teach about stress and ways to handle stress Assist the patient in developing a coping action plan for stressors Conduct skills based training for coping strategies Train problem focused skills Sort out what activities the individual can do Encourage patient to rely upon his/her spiritual faith Teach the patient to use guided imagery Probe and evaluate family's ability to provide emotional support Allow family to share their fears Assist the patient in identifying, sorting through, and verbalizing the various feelings generated by his/her medical condition Meet with family members  Ask patient list out limitations  Use stress inoculation training  Use Acceptance and Commitment Therapy to help client accept uncomfortable  realities in order to accomplish value-consistent goals Reinforce the client's insight into the role of his/her past emotional pain and present anxiety  Discuss examples demonstrating that unrealistic worry overestimates the probability of threats and underestimate patient's ability  Assist the patient in analyzing his or her worries Help patient understand that avoidance is reinforcing  Behavioral activation help the client explore the relationship, nature of the dispute,  Help the client develop new interpersonal skills and relationships Conduct Problem so living therapy Teach conflict resolution skills Use a process-experiential approach Conduct TLDP Conduct ACT  The patient and clinician reviewed the treatment plan on 05/29/2022. The patient agreed with the treatment plan.    Conception Chancy, PsyD

## 2022-07-24 ENCOUNTER — Encounter: Payer: Self-pay | Admitting: Pharmacist

## 2022-07-24 DIAGNOSIS — E118 Type 2 diabetes mellitus with unspecified complications: Secondary | ICD-10-CM

## 2022-07-24 MED ORDER — OZEMPIC (1 MG/DOSE) 4 MG/3ML ~~LOC~~ SOPN: 1.0000 mg | PEN_INJECTOR | SUBCUTANEOUS | 1 refills | Status: DC

## 2022-07-30 ENCOUNTER — Ambulatory Visit (INDEPENDENT_AMBULATORY_CARE_PROVIDER_SITE_OTHER): Payer: Commercial Managed Care - PPO | Admitting: Psychologist

## 2022-07-30 DIAGNOSIS — F330 Major depressive disorder, recurrent, mild: Secondary | ICD-10-CM | POA: Diagnosis not present

## 2022-07-30 DIAGNOSIS — F411 Generalized anxiety disorder: Secondary | ICD-10-CM

## 2022-07-30 NOTE — Progress Notes (Signed)
[facility] Counselor/Therapist Progress Note  Patient ID: Meredith Diaz, MRN: [id number],    Date: 07/30/2022  Time Spent: 10:04 am to 10:49 am; total time: 45 minutes  This session was held via in person. The patient consented to in-person therapy and was in the clinician's office. Limits of confidentiality were discussed with the patient.    Treatment Type: Individual Therapy  Reported Symptoms: Anxious regarding transition  Mental Status Exam: Appearance:  Well Groomed     Behavior: Appropriate  Motor: Normal  Speech/Language:  Clear and Coherent  Affect: Appropriate  Mood: normal  Thought process: normal  Thought content:   WNL  Sensory/Perceptual disturbances:   WNL  Orientation: oriented to person, place, and time/date  Attention: Good  Concentration: Good  Memory: WNL  Fund of knowledge:  Good  Insight:   Good  Judgment:  Good  Impulse Control: Good   Risk Assessment: Danger to Self:  No Self-injurious Behavior: No Danger to Others: No Duty to Warn:no Physical Aggression / Violence:No  Access to Firearms a concern: No  Gang Involvement:No   Subjective: Beginning the session, patient described herself as having experienced a good week. Continuing to talk, patient stated understanding regarding last session. She indicated that she is still unsure if she wants EMDR and would like to transition to someone within the department. She stated understanding regarding the transition. She spent time reflecting on her experience in counseling and what she learned about herself. She reflected on using ACT to assist her versus CBT. She also briefly processed how adverse childhood experiences (ACES) impact her physical health. She processed thoughts and emotions. She terminated with this therapist as she will transition to a new therapist. She denied suicidal and homicidal ideation.   Interventions:  Worked on developing a therapeutic relationship with the  patient using active listening and reflective statements. Reviewed events since the last session. Normalized and validated expressed thoughts and emotions. Reflected on the events from the weekend. Praised the patient for doing okay and explored what has assisted the patient. Processed thoughts and emotions related to the last session. Normalized and validated feeling anxious about the transition. Provided psychoeducation about the transition. Processed thoughts and emotions. Reflected on the idea of using EMDR to assist the patient. Reflected on how patient is learning to use ACT to assist her in certain situations. Reflected on how the therapeutic relationship assisted the patient. Provided psychoeducation about ACES. Discussed next steps for the patient. Provided empathic statements. Assessed for suicidal and homicidal ideation.   Homework: NA  Next Session: NA. Patient is transitioning to another therapist as clinician is leaving  Diagnosis: F41.1 generalized anxiety disorder and F33.0 major depressive affective disorder, recurrent, mild   Plan:  Goals Work through the grieving process and face reality of own death Accept emotional support from others around them Live life to the fullest, event though time may be limited Become as knowledgeable about the medical condition  Reduce fear, anxiety about the health condition  Accept the illness Accept the role of psychological and behavioral factors  Stabilize anxiety level wile increasing ability to function Learn and implement coping skills that result in a reduction of anxiety  Alleviate depressive symptoms Recognize, accept, and cope with depressive feelings Develop healthy thinking patterns Develop healthy interpersonal relationships  Objectives target date for all objectives is 05/15/2023 Identify feelings associated with the illness Family members share with each other feelings Identify the losses or limitations that have been  experienced Verbalize acceptance of  the reality of the medical condition Commit to learning and implement a proactive approach to managing personal stresses Verbalize an understanding of the medical condition Work with therapist to develop a plan for coping with stress Learn and implement skills for managing stress Engage in social, productive activities that are possible Engage in faith based activities implement positive imagery Identify coping skills and sources of emotional support Patient's partner and family members verbalize their fears regarding severity of health condition Identify sources of emotional distress  Learning and implement calming skills to reduce overall anxiety Learn and implement problem solving strategies Identify and engage in pleasant activities Learning and implement personal and interpersonal skills to reduce anxiety and improve interpersonal relationships Learn to accept limitations in life and commit to tolerating, rather than avoiding, unpleasant emotions while accomplishing meaningful goals Identify major life conflicts from the past and present that form the basis for present anxiety Learn and implement behavioral strategies Verbalize an understanding and resolution of current interpersonal problems Learn and implement problem solving and decision making skills Learn and implement conflict resolution skills to resolve interpersonal problems Verbalize an understanding of healthy and unhealthy emotions verbalize insight into how past relationships may be influence current experiences with depression Use mindfulness and acceptance strategies and increase value based behavior  Increase hopeful statements about the future.   Interventions Teach about stress and ways to handle stress Assist the patient in developing a coping action plan for stressors Conduct skills based training for coping strategies Train problem focused skills Sort out what activities the  individual can do Encourage patient to rely upon his/her spiritual faith Teach the patient to use guided imagery Probe and evaluate family's ability to provide emotional support Allow family to share their fears Assist the patient in identifying, sorting through, and verbalizing the various feelings generated by his/her medical condition Meet with family members  Ask patient list out limitations  Use stress inoculation training  Use Acceptance and Commitment Therapy to help client accept uncomfortable realities in order to accomplish value-consistent goals Reinforce the client's insight into the role of his/her past emotional pain and present anxiety  Discuss examples demonstrating that unrealistic worry overestimates the probability of threats and underestimate patient's ability  Assist the patient in analyzing his or her worries Help patient understand that avoidance is reinforcing  Behavioral activation help the client explore the relationship, nature of the dispute,  Help the client develop new interpersonal skills and relationships Conduct Problem so living therapy Teach conflict resolution skills Use a process-experiential approach Conduct TLDP Conduct ACT  The patient and clinician reviewed the treatment plan on 05/29/2022. The patient agreed with the treatment plan.    Conception Chancy, PsyD

## 2022-08-12 ENCOUNTER — Other Ambulatory Visit (HOSPITAL_COMMUNITY): Payer: Self-pay | Admitting: Cardiology

## 2022-08-12 DIAGNOSIS — I5042 Chronic combined systolic (congestive) and diastolic (congestive) heart failure: Secondary | ICD-10-CM

## 2022-08-14 LAB — HM DIABETES EYE EXAM

## 2022-08-16 ENCOUNTER — Encounter: Payer: Self-pay | Admitting: Internal Medicine

## 2022-09-20 ENCOUNTER — Encounter: Payer: Self-pay | Admitting: Pharmacist

## 2022-09-20 DIAGNOSIS — E118 Type 2 diabetes mellitus with unspecified complications: Secondary | ICD-10-CM

## 2022-09-26 MED ORDER — OZEMPIC (1 MG/DOSE) 4 MG/3ML ~~LOC~~ SOPN: 1.0000 mg | PEN_INJECTOR | SUBCUTANEOUS | 1 refills | Status: DC

## 2022-09-26 NOTE — Addendum Note (Signed)
Addended by: Cheree Ditto on: 09/26/2022 08:48 AM   Modules accepted: Orders

## 2022-10-25 ENCOUNTER — Telehealth: Payer: Self-pay | Admitting: Internal Medicine

## 2022-10-25 NOTE — Telephone Encounter (Signed)
Patient dropped off document Handicap Placard, to be filled out by provider. Patient requested to send it via Call Patient to pick up within 7-days. Document is located in providers tray at front office.Please advise at Mobile [phone] (mobile)

## 2022-10-26 NOTE — Telephone Encounter (Signed)
Placed inside Dr Crawfords office box 

## 2022-10-29 ENCOUNTER — Telehealth: Payer: Self-pay

## 2022-10-29 NOTE — Telephone Encounter (Signed)
Called patient and informed her that her form is ready for pick up.

## 2022-12-03 ENCOUNTER — Telehealth: Payer: Self-pay

## 2022-12-03 ENCOUNTER — Other Ambulatory Visit (HOSPITAL_COMMUNITY): Payer: Self-pay

## 2022-12-04 NOTE — Telephone Encounter (Signed)
PA request received for Brand Symbicort, generic is covered. Per test claim 30 day supply is $247.96 due to patients deductible needing to be met.

## 2022-12-11 ENCOUNTER — Other Ambulatory Visit: Payer: Self-pay | Admitting: Pulmonary Disease

## 2022-12-18 ENCOUNTER — Encounter: Payer: Self-pay | Admitting: Pulmonary Disease

## 2022-12-18 ENCOUNTER — Ambulatory Visit: Payer: Commercial Managed Care - PPO | Admitting: Pulmonary Disease

## 2022-12-18 VITALS — BP 110/60 | HR 96 | Ht 60.0 in | Wt 203.6 lb

## 2022-12-18 DIAGNOSIS — G4733 Obstructive sleep apnea (adult) (pediatric): Secondary | ICD-10-CM

## 2022-12-18 DIAGNOSIS — I429 Cardiomyopathy, unspecified: Secondary | ICD-10-CM | POA: Diagnosis not present

## 2022-12-18 NOTE — Progress Notes (Signed)
Subjective:    Patient ID: Meredith Diaz, female    DOB: October 08, 1980, 42 y.o.   MRN: [id number]  Patient with a history of mild obstructive obstructive sleep apnea  Has been doing relatively well since her last visit Has managed to lose about 30 pounds Energy levels better Activity levels also better  Still limited with actives of daily living but improving  Oxygen supplementation as needed during the day  Has been trying to be compliant with CPAP use on a nightly basis  On Symbicort, albuterol as needed   Multiple comorbidities including systolic and diastolic congestive heart failure, hypothyroidism, chronic fatigue, kyphoscoliosis -Continues to follow-up with cardiology -Last echocardiogram was stable from previous  Diagnosed with obstructive sleep apnea in 2016 Usually goes to bed about 10 PM to midnight Takes her between 10 and 30 minutes to fall asleep Rarely wakes up in the middle of the night  Final awakening time about 8 AM  Was seen in the past by by Dr. Marchelle Gearing for possible interstitial lung disease High-resolution CT scan of the chest was not confirmatory of interstitial lung disease  PFT from 2016 did reveal severe restrictive disease Significant decrease in lung volumes also noted on CT scan of the chest Significant subcutaneous tissue, mediastinal lipomatosis      Past Medical History:  Diagnosis Date   Anxiety    Asthma    Back pain    Chronic combined systolic and diastolic CHF (congestive heart failure) (HCC) 07/2011   a. Prior Echo: EF was 25-30%; b. No Sig CAD on CATH; c. No significant finding on Cardiac MRI; d. follow-up Echo 2014 --  EF of 50%;  e. 03/2014 Echo: EF 40-45%, gr 1 DD; f. 11/2014 Echo: EF 40-45%, Gr 2 DD.   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   Depression     Dyspnea    Essential hypertension    Family history of breast cancer    Family history of colon cancer    Fibromyalgia    Gallbladder problem    Hypothyroidism    IBS (irritable bowel syndrome)    Morbid obesity (HCC)    Nonischemic cardiomyopathy (HCC)    a. unclear etiology;  b. 01/2012 Cardiac MRI: no infiltrative pathology, EF 52%;  b. 11/2014 Echo: EF 40-45%, gr 2 DD.   Scoliosis    Sleep apnea    Stomach problems    Thyroid disease    Vitamin B 12 deficiency    Vitamin D deficiency    Social History   Socioeconomic History   Marital status: Married    Spouse name: [name] Rieger   Number of children: 0   Years of education: Not on file   Highest education level: Not on file  Occupational History   Occupation: Programmer, multimedia  Tobacco Use   Smoking status: Never   Smokeless tobacco: Never  Vaping Use   Vaping status: Never Used  Substance and Sexual Activity   Alcohol use: No   Drug use: No   Sexual activity: Not on file  Other Topics Concern   Not on file  Social History Narrative   Not on file   Social Determinants of Health   Financial Resource Strain: Medium Risk (08/11/2019)   Overall Financial Resource Strain (CARDIA)    Difficulty of Paying Living Expenses: Somewhat hard  Food Insecurity: Food Insecurity Present (08/11/2019)   Hunger Vital Sign    Worried About Running Out of Food in the Last Year: Sometimes true    Ran Out of Food in the Last Year: Never true  Transportation Needs: No Transportation Needs (08/11/2019)   PRAPARE - Administrator, Civil Service (Medical): No    Lack of Transportation (Non-Medical): No  Physical Activity: Inactive (08/11/2019)   Exercise Vital Sign    Days of Exercise per Week: 0 days    Minutes of Exercise per Session: 0 min  Stress: Stress Concern Present (08/11/2019)   [employer] of Occupational Health - Occupational Stress Questionnaire    Feeling of Stress : Very much  Social Connections: Socially Isolated  (08/11/2019)   Social Connection and Isolation Panel [NHANES]    Frequency of Communication with Friends and Family: Twice a week    Frequency of Social Gatherings with Friends and Family: Never    Attends Religious Services: Never    Database administrator or Organizations: No    Attends Engineer, structural: Never    Marital Status: Married  Catering manager Violence: Not on file   Family History  Problem Relation Age of Onset   Depression Mother    Alcoholism Mother    Hypertension Father    Heart disease Father    Colon cancer Maternal Grandmother 62   Heart attack Maternal Grandmother    Heart disease Maternal Grandfather    Heart attack Maternal Grandfather    Heart disease Paternal Grandmother    Breast cancer Paternal Grandmother 36       possible bilateral cancer   Asthma Paternal Grandmother    Heart attack Paternal Grandmother    Heart attack Paternal Grandfather    Breast cancer Maternal Aunt 50   Breast cancer Paternal Aunt 50   Stroke Neg Hx    Thyroid disease Neg Hx        Review of Systems  Constitutional:  Positive for fatigue. Negative for fever.  Respiratory:  Positive for apnea and shortness of breath. Negative for cough.   Genitourinary:  Negative for dysuria.  Skin:  Negative for rash.  Allergic/Immunologic: Negative.   Psychiatric/Behavioral:  Positive for sleep disturbance. Negative for dysphoric mood.        Objective:   Physical Exam Constitutional:      General: She is not in acute distress.    Appearance: She is obese.  HENT:     Head: Normocephalic and atraumatic.     Mouth/Throat:     Mouth: Mucous membranes are moist.  Eyes:     Pupils: Pupils are equal, round, and reactive to light.  Cardiovascular:     Rate and Rhythm: Normal rate and regular rhythm.     Heart sounds: No murmur heard.    No friction rub.  Pulmonary:     Effort: Pulmonary effort is normal. No respiratory distress.     Breath sounds: Normal breath  sounds. No stridor. No wheezing or rhonchi.  Musculoskeletal:     Cervical back: No rigidity or tenderness.  Neurological:     Mental Status: She is alert.  Psychiatric:        Mood and Affect: Mood normal.        Behavior: Behavior normal.    Vitals:   12/18/22 1502  BP: 110/60  Pulse: 96  SpO2: 95%       03/20/2022    8:00 AM 02/25/2019  9:00 AM 06/10/2018    2:00 PM  Results of the Epworth flowsheet  Sitting and reading 1 2 1   Watching TV 1 2 1   Sitting, inactive in a public place (e.g. a theatre or a meeting) 0 0 0  As a passenger in a car for an hour without a break 3 3 3   Lying down to rest in the afternoon when circumstances permit 3 3 3   Sitting and talking to someone 0 0 0  Sitting quietly after a lunch without alcohol 2 1 1   In a car, while stopped for a few minutes in traffic 0 0 0  Total score 10 11 9    CT scan of the chest reviewed-10/01/2018-reviewed by myself with the patient, previous CT [address] 19 also reviewed, 10/10/2016 reviewed  Pulmonary function study from 08/25/2014 reviewed showing severe restriction PFT from 04/28/2019 with severe restrictive disease  Echocardiogram from 03/11/2018 reviewed showing normal systolic function  ABG in the past with hypercapnia  CPAP compliance reviewed 80% compliance Average use of 7 hours 27 minutes AutoSet of 5-18 95 percentile pressure 14.2 Residual AHI of 0.7    Assessment & Plan:  .  Obstructive sleep apnea -Encouraged to continue to use CPAP on a nightly basis -Download shows sleep apnea is well treated  .  Obesity hypoventilation -She is down about 30 pounds -Continues to work on weight loss -This appears to be helping overall symptomatology  .  Systolic and diastolic heart failure .  Cardiomyopathy -Echocardiogram is stable, continues to follow-up with cardiology  .  Severe restrictive lung disease -This is likely related to body habitus. -Kyphoscoliosis -Weight loss is helping  Encouraged to  continue using CPAP on a nightly basis Continue weight loss efforts -Continue graded exercise as tolerated  She is currently on Ozempic which seems to be helping weight loss efforts  Encouraged to call with any other significant concerns  Overall symptoms are improving  Follow-up in 6 months

## 2022-12-18 NOTE — Patient Instructions (Signed)
Your CPAP is working well Controlling events well  Continue weight loss efforts  Continue graded activities as tolerated  Call us with significant concerns  I will see you back in about 6 months

## 2023-01-01 ENCOUNTER — Encounter: Payer: Self-pay | Admitting: Pulmonary Disease

## 2023-01-04 ENCOUNTER — Other Ambulatory Visit: Payer: Self-pay | Admitting: Pulmonary Disease

## 2023-01-04 MED ORDER — BUDESONIDE-FORMOTEROL FUMARATE 160-4.5 MCG/ACT IN AERO: 2.0000 | INHALATION_SPRAY | Freq: Two times a day (BID) | RESPIRATORY_TRACT | 6 refills | Status: DC

## 2023-01-14 ENCOUNTER — Other Ambulatory Visit: Payer: Self-pay | Admitting: Pulmonary Disease

## 2023-03-11 ENCOUNTER — Other Ambulatory Visit (HOSPITAL_COMMUNITY): Payer: Self-pay | Admitting: Cardiology

## 2023-03-11 DIAGNOSIS — I5042 Chronic combined systolic (congestive) and diastolic (congestive) heart failure: Secondary | ICD-10-CM

## 2023-03-26 ENCOUNTER — Other Ambulatory Visit: Payer: Self-pay

## 2023-03-26 ENCOUNTER — Observation Stay (HOSPITAL_COMMUNITY)
Admission: EM | Admit: 2023-03-26 | Discharge: 2023-03-27 | Disposition: A | Discharge status: Home / Self Care | Payer: Commercial Managed Care - PPO | Attending: Internal Medicine | Admitting: Internal Medicine

## 2023-03-26 ENCOUNTER — Encounter (HOSPITAL_COMMUNITY): Payer: Self-pay | Admitting: Family Medicine

## 2023-03-26 DIAGNOSIS — F418 Other specified anxiety disorders: Secondary | ICD-10-CM | POA: Diagnosis present

## 2023-03-26 DIAGNOSIS — I110 Hypertensive heart disease with heart failure: Secondary | ICD-10-CM | POA: Insufficient documentation

## 2023-03-26 DIAGNOSIS — G4733 Obstructive sleep apnea (adult) (pediatric): Secondary | ICD-10-CM

## 2023-03-26 DIAGNOSIS — I5042 Chronic combined systolic (congestive) and diastolic (congestive) heart failure: Secondary | ICD-10-CM | POA: Diagnosis not present

## 2023-03-26 DIAGNOSIS — J45909 Unspecified asthma, uncomplicated: Secondary | ICD-10-CM | POA: Diagnosis not present

## 2023-03-26 DIAGNOSIS — Z7985 Long-term (current) use of injectable non-insulin antidiabetic drugs: Secondary | ICD-10-CM | POA: Insufficient documentation

## 2023-03-26 DIAGNOSIS — D72829 Elevated white blood cell count, unspecified: Secondary | ICD-10-CM | POA: Diagnosis not present

## 2023-03-26 DIAGNOSIS — E118 Type 2 diabetes mellitus with unspecified complications: Secondary | ICD-10-CM | POA: Diagnosis present

## 2023-03-26 DIAGNOSIS — T43202A Poisoning by unspecified antidepressants, intentional self-harm, initial encounter: Principal | ICD-10-CM | POA: Insufficient documentation

## 2023-03-26 DIAGNOSIS — Z7984 Long term (current) use of oral hypoglycemic drugs: Secondary | ICD-10-CM | POA: Diagnosis not present

## 2023-03-26 DIAGNOSIS — E039 Hypothyroidism, unspecified: Secondary | ICD-10-CM | POA: Diagnosis not present

## 2023-03-26 DIAGNOSIS — E119 Type 2 diabetes mellitus without complications: Secondary | ICD-10-CM | POA: Diagnosis not present

## 2023-03-26 DIAGNOSIS — Z79899 Other long term (current) drug therapy: Secondary | ICD-10-CM | POA: Diagnosis not present

## 2023-03-26 DIAGNOSIS — T50901A Poisoning by unspecified drugs, medicaments and biological substances, accidental (unintentional), initial encounter: Secondary | ICD-10-CM | POA: Diagnosis present

## 2023-03-26 DIAGNOSIS — T43291A Poisoning by other antidepressants, accidental (unintentional), initial encounter: Principal | ICD-10-CM

## 2023-03-26 DIAGNOSIS — I10 Essential (primary) hypertension: Secondary | ICD-10-CM | POA: Diagnosis present

## 2023-03-26 LAB — CBG MONITORING, ED: Glucose-Capillary: 108 mg/dL — ABNORMAL HIGH (ref 70–99)

## 2023-03-26 LAB — CBC WITH DIFFERENTIAL/PLATELET
Abs Immature Granulocytes: 0.08 10*3/uL — ABNORMAL HIGH (ref 0.00–0.07)
Basophils Absolute: 0.1 10*3/uL (ref 0.0–0.1)
Basophils Relative: 1 %
Eosinophils Absolute: 0.1 10*3/uL (ref 0.0–0.5)
Eosinophils Relative: 1 %
HCT: 39.2 % (ref 36.0–46.0)
Hemoglobin: 13.1 g/dL (ref 12.0–15.0)
Immature Granulocytes: 1 %
Lymphocytes Relative: 20 %
Lymphs Abs: 2.8 10*3/uL (ref 0.7–4.0)
MCH: 30.8 pg (ref 26.0–34.0)
MCHC: 33.4 g/dL (ref 30.0–36.0)
MCV: 92.2 fL (ref 80.0–100.0)
Monocytes Absolute: 1.1 10*3/uL — ABNORMAL HIGH (ref 0.1–1.0)
Monocytes Relative: 8 %
Neutro Abs: 9.6 10*3/uL — ABNORMAL HIGH (ref 1.7–7.7)
Neutrophils Relative %: 69 %
Platelets: 391 10*3/uL (ref 150–400)
RBC: 4.25 MIL/uL (ref 3.87–5.11)
RDW: 13.9 % (ref 11.5–15.5)
WBC: 13.8 10*3/uL — ABNORMAL HIGH (ref 4.0–10.5)
nRBC: 0 % (ref 0.0–0.2)

## 2023-03-26 LAB — COMPREHENSIVE METABOLIC PANEL
ALT: 19 U/L (ref 0–44)
AST: 17 U/L (ref 15–41)
Albumin: 3.3 g/dL — ABNORMAL LOW (ref 3.5–5.0)
Alkaline Phosphatase: 85 U/L (ref 38–126)
Anion gap: 11 (ref 5–15)
BUN: 14 mg/dL (ref 6–20)
CO2: 25 mmol/L (ref 22–32)
Calcium: 9 mg/dL (ref 8.9–10.3)
Chloride: 101 mmol/L (ref 98–111)
Creatinine, Ser: 1.16 mg/dL — ABNORMAL HIGH (ref 0.44–1.00)
GFR, Estimated: 60 mL/min — ABNORMAL LOW (ref >60)
Glucose, Bld: 117 mg/dL — ABNORMAL HIGH (ref 70–99)
Potassium: 3.5 mmol/L (ref 3.5–5.1)
Sodium: 137 mmol/L (ref 135–145)
Total Bilirubin: 0.4 mg/dL (ref <1.2)
Total Protein: 7.2 g/dL (ref 6.5–8.1)

## 2023-03-26 LAB — GLUCOSE, CAPILLARY: Glucose-Capillary: 97 mg/dL (ref 70–99)

## 2023-03-26 LAB — MAGNESIUM: Magnesium: 2.2 mg/dL (ref 1.7–2.4)

## 2023-03-26 MED ORDER — ACETAMINOPHEN 325 MG PO TABS
650.0000 mg | ORAL_TABLET | Freq: Four times a day (QID) | ORAL | Status: DC | PRN
  Administered 2023-03-27 (×2): 650 mg via ORAL
  Filled 2023-03-26 (×2): qty 2

## 2023-03-26 MED ORDER — INSULIN ASPART 100 UNIT/ML IJ SOLN
0.0000 [IU] | Freq: Three times a day (TID) | INTRAMUSCULAR | Status: DC
  Administered 2023-03-27: 1 [IU] via SUBCUTANEOUS

## 2023-03-26 MED ORDER — LACTATED RINGERS IV BOLUS
1000.0000 mL | Freq: Once | INTRAVENOUS | Status: AC
  Administered 2023-03-26: 1000 mL via INTRAVENOUS

## 2023-03-26 MED ORDER — LORAZEPAM 2 MG/ML IJ SOLN
1.0000 mg | Freq: Once | INTRAMUSCULAR | Status: AC
  Administered 2023-03-26: 1 mg via INTRAVENOUS
  Filled 2023-03-26: qty 1

## 2023-03-26 MED ORDER — INSULIN ASPART 100 UNIT/ML IJ SOLN
0.0000 [IU] | Freq: Three times a day (TID) | INTRAMUSCULAR | Status: DC
Start: 2023-03-26 — End: 2023-03-26

## 2023-03-26 MED ORDER — ACETAMINOPHEN 650 MG RE SUPP: 650.0000 mg | Freq: Four times a day (QID) | RECTAL | Status: DC | PRN

## 2023-03-26 MED ORDER — POTASSIUM CHLORIDE CRYS ER 20 MEQ PO TBCR
40.0000 meq | EXTENDED_RELEASE_TABLET | Freq: Once | ORAL | Status: AC
  Administered 2023-03-26: 40 meq via ORAL
  Filled 2023-03-26: qty 2

## 2023-03-26 MED ORDER — LORAZEPAM 2 MG/ML IJ SOLN: 4.0000 mg | INTRAMUSCULAR | Status: DC | PRN

## 2023-03-26 MED ORDER — FLUTICASONE FUROATE-VILANTEROL 200-25 MCG/ACT IN AEPB
1.0000 | INHALATION_SPRAY | Freq: Every day | RESPIRATORY_TRACT | Status: DC
  Administered 2023-03-27: 1 via RESPIRATORY_TRACT
  Filled 2023-03-26: qty 28

## 2023-03-26 MED ORDER — ALBUTEROL SULFATE (2.5 MG/3ML) 0.083% IN NEBU: 2.5000 mg | INHALATION_SOLUTION | Freq: Four times a day (QID) | RESPIRATORY_TRACT | Status: DC | PRN

## 2023-03-26 MED ORDER — METOPROLOL TARTRATE 5 MG/5ML IV SOLN: 5.0000 mg | Freq: Four times a day (QID) | INTRAVENOUS | Status: DC | PRN

## 2023-03-26 MED ORDER — LEVOTHYROXINE SODIUM 112 MCG PO TABS
112.0000 ug | ORAL_TABLET | Freq: Every day | ORAL | Status: DC
  Administered 2023-03-27: 112 ug via ORAL
  Filled 2023-03-26: qty 1

## 2023-03-26 MED ORDER — DIAZEPAM 5 MG/ML IJ SOLN
5.0000 mg | Freq: Once | INTRAMUSCULAR | Status: AC
  Administered 2023-03-26: 5 mg via INTRAVENOUS
  Filled 2023-03-26: qty 2

## 2023-03-26 MED ORDER — CHARCOAL ACTIVATED PO LIQD
50.0000 g | Freq: Once | ORAL | Status: AC
  Administered 2023-03-26: 50 g via ORAL
  Filled 2023-03-26: qty 240

## 2023-03-26 MED ORDER — EMPAGLIFLOZIN 10 MG PO TABS
10.0000 mg | ORAL_TABLET | Freq: Every day | ORAL | Status: DC
  Administered 2023-03-27: 10 mg via ORAL
  Filled 2023-03-26: qty 1

## 2023-03-26 NOTE — ED Notes (Signed)
Shift report received, assumed care of patient at this time 

## 2023-03-26 NOTE — ED Notes (Signed)
PT ambulated to the bathroom w/o difficulty.

## 2023-03-26 NOTE — ED Triage Notes (Signed)
Patient arrives from home reporting that she took 2700mg  of buproprion xl. Patient called poison control who told her to come in, patient appears anxious and tearful at this time. VSS.

## 2023-03-26 NOTE — ED Provider Notes (Signed)
[place] EMERGENCY DEPARTMENT AT [facility] Provider Note   CSN: [id number] Arrival date & time: 03/26/23  1121     History  Chief Complaint  Patient presents with   Drug Overdose    Meredith Diaz is a 42 y.o. female.   Drug Overdose  Patient presents for unintentional bupropion overdose.  Medical history includes CHF, HTN, HLD, anxiety, depression, DM, asthma, IBS.  She takes both torsemide and bupropion in the morning.  She got these 2 medications mixed up.  Because of this, she took 4 tablets of her bupropion yesterday morning and this morning.  She also took 1 tablet at nighttime.  This amounts to 2700 mg over the past 24 hours.  It is the extended release formula.  Patient endorses some anxiety but denies any other current physical symptoms.     Home Medications Prior to Admission medications   Medication Sig Start Date End Date Taking? Authorizing Provider  albuterol (PROVENTIL) (2.5 MG/3ML) 0.083% nebulizer solution Take 3 mLs (2.5 mg total) by nebulization every 6 (six) hours as needed for wheezing or shortness of breath. 05/01/22  Yes Smitty Knudsen, PA-C  albuterol (VENTOLIN HFA) 108 (90 Base) MCG/ACT inhaler TAKE 2 PUFFS BY MOUTH EVERY 6 HOURS AS NEEDED FOR WHEEZE OR SHORTNESS OF BREATH Patient taking differently: Inhale 2 puffs into the lungs every 6 (six) hours as needed for shortness of breath. 01/14/23  Yes Olalere, Adewale A, MD  budesonide-formoterol (BREYNA) 160-4.5 MCG/ACT inhaler Inhale 2 puffs into the lungs 2 (two) times daily. 01/04/23  Yes Olalere, Adewale A, MD  buPROPion (WELLBUTRIN XL) 300 MG 24 hr tablet Take 1 tablet (300 mg total) by mouth daily. 05/16/22  Yes Myrlene Broker, MD  cholestyramine light (PREVALITE) 4 g packet Take 4 g by mouth as needed.   Yes [provider]  clobetasol cream (TEMOVATE) 0.05 % Apply 1 application topically See admin instructions. Apply daily to affected areas of genital area as directed    Yes [provider]  empagliflozin (JARDIANCE) 10 MG TABS tablet Take 1 tablet (10 mg total) by mouth daily before breakfast. 03/05/22  Yes Laurey Morale, MD  levothyroxine (SYNTHROID) 112 MCG tablet Take 1 tablet (112 mcg total) by mouth daily. 05/16/22  Yes Myrlene Broker, MD  metoprolol succinate (TOPROL-XL) 25 MG 24 hr tablet TAKE 1 TABLET BY MOUTH DAILY PLEASE KEEP FOLLOW UP APPOINTMENT Patient taking differently: Take 25 mg by mouth daily. TAKE 1 TABLET BY MOUTH DAILY PLEASE KEEP FOLLOW UP APPOINTMENT 03/11/23  Yes Laurey Morale, MD  potassium chloride SA (KLOR-CON M) 20 MEQ tablet Take 1 tablet (20 mEq total) by mouth 2 (two) times daily. 07/03/21  Yes Milford, Anderson Malta, FNP  Semaglutide, 1 MG/DOSE, (OZEMPIC, 1 MG/DOSE,) 4 MG/3ML SOPN Inject 1 mg into the skin once a week. 09/26/22  Yes Laurey Morale, MD  torsemide (DEMADEX) 20 MG tablet TAKE 4 TABLETS BY MOUTH IN THE MORNING AND 3 TABLET EVERY EVENING. ABSOLUTE LAST REFILL WITHOUT OFFICE VISIT PLEASE CALL [phone] TO SCHEDULE. Patient taking differently: Take 80 mg by mouth daily. Patient only takes 4 in the morning, only takes 3 additional tablets in the evening if Patient feels she needs it. 07/09/22  Yes Milford, Jessica M, FNP  valsartan (DIOVAN) 80 MG tablet TAKE 1 TABLET BY MOUTH EVERY DAY PLEASE KEEP FOLLOW UP APPOINTMENT Patient taking differently: Take 80 mg by mouth daily. TAKE 1 TABLET BY MOUTH EVERY DAY PLEASE KEEP FOLLOW  UP APPOINTMENT 03/11/23  Yes Laurey Morale, MD      Allergies    Lisinopril    Review of Systems   Review of Systems  Psychiatric/Behavioral:  The patient is nervous/anxious.   All other systems reviewed and are negative.   Physical Exam Updated Vital Signs BP 97/66   Pulse 87   Temp 99.1 F (37.3 C) (Oral)   Resp 19   SpO2 96%  Physical Exam Vitals and nursing note reviewed.  Constitutional:      General: She is not in acute distress.    Appearance: Normal appearance.  She is well-developed. She is not ill-appearing, toxic-appearing or diaphoretic.  HENT:     Head: Normocephalic and atraumatic.     Right Ear: External ear normal.     Left Ear: External ear normal.     Nose: Nose normal.     Mouth/Throat:     Mouth: Mucous membranes are moist.  Eyes:     Extraocular Movements: Extraocular movements intact.     Conjunctiva/sclera: Conjunctivae normal.  Cardiovascular:     Rate and Rhythm: Normal rate and regular rhythm.  Pulmonary:     Effort: Pulmonary effort is normal. No respiratory distress.  Abdominal:     General: There is no distension.     Palpations: Abdomen is soft.     Tenderness: There is no abdominal tenderness.  Musculoskeletal:        General: No swelling. Normal range of motion.     Cervical back: Normal range of motion and neck supple.     Right lower leg: No edema.     Left lower leg: No edema.  Skin:    General: Skin is warm and dry.     Coloration: Skin is not jaundiced or pale.  Neurological:     General: No focal deficit present.     Mental Status: She is alert and oriented to person, place, and time.     Cranial Nerves: No cranial nerve deficit.     Sensory: No sensory deficit.     Motor: No weakness.     Coordination: Coordination normal.  Psychiatric:        Mood and Affect: Mood normal.        Behavior: Behavior normal.     ED Results / Procedures / Treatments   Labs (all labs ordered are listed, but only abnormal results are displayed) Labs Reviewed  COMPREHENSIVE METABOLIC PANEL - Abnormal; Notable for the following components:      Result Value   Glucose, Bld 117 (*)    Creatinine, Ser 1.16 (*)    Albumin 3.3 (*)    GFR, Estimated 60 (*)    All other components within normal limits  CBC WITH DIFFERENTIAL/PLATELET - Abnormal; Notable for the following components:   WBC 13.8 (*)    Neutro Abs 9.6 (*)    Monocytes Absolute 1.1 (*)    Abs Immature Granulocytes 0.08 (*)    All other components within  normal limits  MAGNESIUM  CBC WITH DIFFERENTIAL/PLATELET  HEMOGLOBIN A1C  RAPID URINE DRUG SCREEN, HOSP PERFORMED  TSH  T4, FREE  HIV ANTIBODY (ROUTINE TESTING W REFLEX)    EKG EKG Interpretation Date/Time:  Tuesday March 26 2023 11:09:54 EST Ventricular Rate:  91 PR Interval:  160 QRS Duration:  84 QT Interval:  424 QTC Calculation: 521 R Axis:   27  Text Interpretation: Normal sinus rhythm ST & T wave abnormality, consider inferolateral ischemia Abnormal ECG Baseline  wander Confirmed by Vonita Moss [phone]) on 03/26/2023 11:59:28 AM  Radiology No results found.  Procedures Procedures    Medications Ordered in ED Medications  diazepam (VALIUM) injection 5 mg (0 mg Intravenous Hold 03/26/23 1314)  insulin aspart (novoLOG) injection 0-9 Units (has no administration in time range)  acetaminophen (TYLENOL) tablet 650 mg (has no administration in time range)    Or  acetaminophen (TYLENOL) suppository 650 mg (has no administration in time range)  metoprolol tartrate (LOPRESSOR) injection 5 mg (has no administration in time range)  LORazepam (ATIVAN) injection 4 mg (has no administration in time range)  charcoal activated (NO SORBITOL) (ACTIDOSE-AQUA) suspension 50 g (50 g Oral Given 03/26/23 1236)  LORazepam (ATIVAN) injection 1 mg (1 mg Intravenous Given 03/26/23 1250)  lactated ringers bolus 1,000 mL (1,000 mLs Intravenous New Bag/Given 03/26/23 1318)  potassium chloride SA (KLOR-CON M) CR tablet 40 mEq (40 mEq Oral Given 03/26/23 1409)    ED Course/ Medical Decision Making/ A&P                                 Medical Decision Making Amount and/or Complexity of Data Reviewed Labs: ordered.  Risk Prescription drug management. Decision regarding hospitalization.   This patient presents to the ED for concern of bupropion overdose, this involves an extensive number of treatment options, and is a complaint that carries with it a high risk of complications and  morbidity.  The differential diagnosis includes toxic effects from overdose   Co morbidities that complicate the patient evaluation  CHF, HTN, HLD, anxiety, depression, DM, asthma, IBS   Additional history obtained:  Additional history obtained from N/A External records from outside source obtained and reviewed including EMR   Lab Tests:  I Ordered, and personally interpreted labs.  The pertinent results include: Normal hemoglobin, normal electrolytes.  Nonspecific leukocytosis present.   Cardiac Monitoring: / EKG:  The patient was maintained on a cardiac monitor.  I personally viewed and interpreted the cardiac monitored which showed an underlying rhythm of: Sinus rhythm  Problem List / ED Course / Critical interventions / Medication management  Patient presenting for unintentional bupropion overdose.  This was caused by medication mixup at home.  Patient is clear that this was not an attempt of self-harm.  Her EKG today shows narrow complex QRS with normal PR interval and slightly prolonged QTc.  On exam, patient is well-appearing.  She endorses some anxiety but denies any other current symptoms.  Vital signs are normal.  Nursing spoke with poison control who had the following recommendations: 18-hour observation, activated charcoal, continuous cardiac monitoring, every 3 hour EKGs, check of electrolytes and target potassium of 4.0 and ending exam of 2.0, as needed IV fluids and benzodiazepines, seizure precautions.  She was given activated charcoal.  She endorses anxiety.  Valium was ordered to help with that.  IV fluids were ordered for hydration.  Lab work shows a potassium of 3.5.  Potassium chloride was ordered for electrolyte optimization.  Patient remained stable.  She was admitted for continued observation. I ordered medication including IV fluids for hydration; activated charcoal for bupropion overdose; potassium chloride for electrolyte optimization; Ativan and Valium for her  anxiety Reevaluation of the patient after these medicines showed that the patient improved I have reviewed the patients home medicines and have made adjustments as needed   Social Determinants of Health:  Has access to outpatient care  Final Clinical Impression(s) / ED Diagnoses Final diagnoses:  Bupropion overdose, accidental or unintentional, initial encounter    Rx / DC Orders ED Discharge Orders     None         Gloris Manchester, MD 03/26/23 1610

## 2023-03-26 NOTE — Assessment & Plan Note (Addendum)
42 year old presenting to ED after accidental wellbutrin overdose -obs to progressive -she has taken 2700mg  of the XR in 24 hour period -charcoal given on arrival to ED at noon on 11/19 -she has no tachycardia and mildly prolonged Qtc. Repeat EKG in AM  -continue tele -poison control contacted with recs for monitoring x 18 hours and charcoal  -seizure precautions  -NPO for 8 hours after charcoal, will place diet after this time  -accidental, mixed up her torsemide pills with her wellbutrin

## 2023-03-26 NOTE — H&P (Addendum)
History and Physical    Patient: Meredith Diaz ZOX:[id number] DOB: 11/30/1980 DOA: 03/26/2023 DOS: the patient was seen and examined on 03/26/2023 PCP: Myrlene Broker, MD  Patient coming from: Home - lives with her husband.    Chief Complaint: drug overdose   HPI: Sharicka Langlie is a 42 y.o. female with medical history significant of GAD and depression, HTN, hypothyroidism, OSA on cpap, T2DM, combined CHF, asthma who presented to ED after overdosing on her wellbutrin. She took 2700mg  over the past 24 hours.(300mg  XR pills)  This was accidental. She got her torsemide confused with her Wellbutrin. She typically takes 4 tablets of her torsemide in the AM and accidentally took Wellbutrin. She then took another at nighttime, which was her normal wellbutrin dose. This morning she took another 4 pill of her wellbutrin and realized that she had mixed up her torsemide so came to ED.  Last night she felt her heart racing and thought she was going to jump out of her skin. She had some nausea this AM as well and after they gave her charcoal.   Denies any fever/chills, vision changes/headaches, chest pain, shortness of breath or cough, abdominal pain, V/D, dysuria or leg swelling.   She does not smoke or drink alcohol.   Poison control already contacted.   ER Course:  vitals: afebrile, bp: 137/87, HR: 94, RR: 22, oxygen: 93%RA Pertinent labs: wbc: 13.8, creatinine 1.16,  In ED: patient given charcoal, ativan and potassium, 1L IVF bolus. TRH asked to admit.   Review of Systems: As mentioned in the history of present illness. All other systems reviewed and are negative. Past Medical History:  Diagnosis Date   Anxiety    Asthma    Back pain    Chronic combined systolic and diastolic CHF (congestive heart failure) (HCC) 07/2011   a. Prior Echo: EF was 25-30%; b. No Sig CAD on CATH; c. No significant finding on Cardiac MRI; d. follow-up Echo 2014 --  EF of 50%;  e. 03/2014  Echo: EF 40-45%, gr 1 DD; f. 11/2014 Echo: EF 40-45%, Gr 2 DD.   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   Depression    Dyspnea    Essential hypertension    Family history of breast cancer    Family history of colon cancer    Fibromyalgia    Gallbladder problem    Hypothyroidism    IBS (irritable bowel syndrome)    Morbid obesity (HCC)    Nonischemic cardiomyopathy (HCC)    a. unclear etiology;  b. 01/2012 Cardiac MRI: no infiltrative pathology, EF 52%;  b. 11/2014 Echo: EF 40-45%, gr 2 DD.   Scoliosis    Sleep apnea    Stomach problems    Thyroid disease    Vitamin B 12 deficiency    Vitamin D deficiency    Past Surgical History:  Procedure Laterality Date   CARDIAC CATHETERIZATION  08/2011   Normal Coronaries - LVEDP 32 mmHg   Cardiac MRI  April 2013   No suggestion of a congenital abnormality; EF ~ 52% - no regional wall motion abnormalities., no increased myocardial signal intensity. No perfusion abnormality.   CHOLECYSTECTOMY  2013   RIGHT AND LEFT HEART CATH N/A 07/10/2018   Procedure: RIGHT AND LEFT HEART CATH;  Surgeon: Laurey Morale, MD;  Location:  [facility];  Service: Cardiovascular;  Laterality: N/A;   TRANSTHORACIC ECHOCARDIOGRAM  07/2011   Moderate concentric LVH, mildly dilated. EF 25-30% no diastolic dysfunction parameters to suggest elevated LAP   TRANSTHORACIC ECHOCARDIOGRAM  December 2015; July 2016   a)  EF by echo 40-45% with global hypokinesis, mild LVH possible apical variant hypertrophic cardiomyopathy;; b)  EF 40-45%. Normal wall thickness (compared to prior reading of possible apical LVH), grade 2 DD -- with elevated LV filling pressures (however left atrium was read as normal size), normal artery function and size. No no suggestion of pulmonary hypertension   Social History:  reports that she  has never smoked. She has never used smokeless tobacco. She reports that she does not drink alcohol and does not use drugs.  Allergies  Allergen Reactions   Lisinopril Swelling and Other (See Comments)    Angioedema facial, mostly just lips, no trouble breathing    Family History  Problem Relation Age of Onset   Depression Mother    Alcoholism Mother    Hypertension Father    Heart disease Father    Colon cancer Maternal Grandmother 64   Heart attack Maternal Grandmother    Heart disease Maternal Grandfather    Heart attack Maternal Grandfather    Heart disease Paternal Grandmother    Breast cancer Paternal Grandmother 89       possible bilateral cancer   Asthma Paternal Grandmother    Heart attack Paternal Grandmother    Heart attack Paternal Grandfather    Breast cancer Maternal Aunt 50   Breast cancer Paternal Aunt 50   Stroke Neg Hx    Thyroid disease Neg Hx     Prior to Admission medications   Medication Sig Start Date End Date Taking? Authorizing Provider  albuterol (PROVENTIL) (2.5 MG/3ML) 0.083% nebulizer solution Take 3 mLs (2.5 mg total) by nebulization every 6 (six) hours as needed for wheezing or shortness of breath. 05/01/22  Yes Smitty Knudsen, PA-C  albuterol (VENTOLIN HFA) 108 (90 Base) MCG/ACT inhaler TAKE 2 PUFFS BY MOUTH EVERY 6 HOURS AS NEEDED FOR WHEEZE OR SHORTNESS OF BREATH Patient taking differently: Inhale 2 puffs into the lungs every 6 (six) hours as needed for shortness of breath. 01/14/23  Yes Olalere, Adewale A, MD  budesonide-formoterol (BREYNA) 160-4.5 MCG/ACT inhaler Inhale 2 puffs into the lungs 2 (two) times daily. 01/04/23  Yes Olalere, Adewale A, MD  buPROPion (WELLBUTRIN XL) 300 MG 24 hr tablet Take 1 tablet (300 mg total) by mouth daily. 05/16/22  Yes Myrlene Broker, MD  cholestyramine light (PREVALITE) 4 g packet Take 4 g by mouth as needed.   Yes [provider]  clobetasol cream (TEMOVATE) 0.05 % Apply 1 application topically  See admin instructions. Apply daily to affected areas of genital area as directed   Yes [provider]  empagliflozin (JARDIANCE) 10 MG TABS tablet Take 1 tablet (10 mg total) by mouth daily before breakfast. 03/05/22  Yes Laurey Morale, MD  levothyroxine (SYNTHROID) 112 MCG tablet Take 1 tablet (112 mcg total) by mouth daily. 05/16/22  Yes Myrlene Broker, MD  metoprolol succinate (TOPROL-XL) 25 MG 24 hr tablet TAKE 1 TABLET BY MOUTH DAILY PLEASE KEEP FOLLOW UP APPOINTMENT Patient taking differently: Take 25 mg by mouth daily. TAKE 1 TABLET BY MOUTH DAILY PLEASE KEEP FOLLOW UP APPOINTMENT 03/11/23  Yes Laurey Morale, MD  potassium chloride SA (KLOR-CON M) 20 MEQ tablet Take 1 tablet (20 mEq total) by  mouth 2 (two) times daily. 07/03/21  Yes Milford, Anderson Malta, FNP  Semaglutide, 1 MG/DOSE, (OZEMPIC, 1 MG/DOSE,) 4 MG/3ML SOPN Inject 1 mg into the skin once a week. 09/26/22  Yes Laurey Morale, MD  torsemide (DEMADEX) 20 MG tablet TAKE 4 TABLETS BY MOUTH IN THE MORNING AND 3 TABLET EVERY EVENING. ABSOLUTE LAST REFILL WITHOUT OFFICE VISIT PLEASE CALL [phone] TO SCHEDULE. Patient taking differently: Take 80 mg by mouth daily. Patient only takes 4 in the morning, only takes 3 additional tablets in the evening if Patient feels she needs it. 07/09/22  Yes Milford, Jessica M, FNP  valsartan (DIOVAN) 80 MG tablet TAKE 1 TABLET BY MOUTH EVERY DAY PLEASE KEEP FOLLOW UP APPOINTMENT Patient taking differently: Take 80 mg by mouth daily. TAKE 1 TABLET BY MOUTH EVERY DAY PLEASE KEEP FOLLOW UP APPOINTMENT 03/11/23  Yes Laurey Morale, MD    Physical Exam: Vitals:   03/26/23 1622 03/26/23 1800 03/26/23 1815 03/26/23 1830  BP:  104/69 99/71 106/70  Pulse:  85 85 89  Resp:  (!) 22 (!) 24 19  Temp:      TempSrc:      SpO2:  (!) 89% 100% 99%  Weight: 92.4 kg     Height: 5' (1.524 m)      General:  Appears calm and comfortable and is in NAD Eyes:  PERRL, EOMI, normal lids, iris ENT:   grossly normal hearing, lips & tongue, mmm; appropriate dentition Neck:  no LAD, masses or thyromegaly; no carotid bruits Cardiovascular:  RRR, no m/r/g. No LE edema.  Respiratory:   CTA bilaterally with no wheezes/rales/rhonchi.  Normal respiratory effort. Abdomen:  soft, NT, ND, NABS Back:   normal alignment, no CVAT Skin:  no rash or induration seen on limited exam Musculoskeletal:  grossly normal tone BUE/BLE, good ROM, no bony abnormality Lower extremity:  No LE edema.  Limited foot exam with no ulcerations.  2+ distal pulses. Psychiatric:  grossly normal mood and affect, speech fluent and appropriate, AOx3 Neurologic:  CN 2-12 grossly intact, moves all extremities in coordinated fashion, sensation intact   Radiological Exams on Admission: Independently reviewed - see discussion in A/P where applicable  No results found.  EKG: Independently reviewed.  NSR with rate 91; nonspecific ST changes with no evidence of acute ischemia   Labs on Admission: I have personally reviewed the available labs and imaging studies at the time of the admission.  Pertinent labs:   wbc: 13.8,  creatinine 1.16,  Assessment and Plan: Principal Problem:   accidental drug overdose Active Problems:   Mixed anxiety and depressive disorder   Elevated WBC count   Diabetes mellitus type 2 with complications (HCC)   Chronic combined systolic and diastolic heart failure, NYHA class 3 (HCC)   Essential hypertension   Hypothyroidism   OSA on CPAP    Assessment and Plan: * accidental drug overdose 42 year old presenting to ED after accidental wellbutrin overdose -obs to progressive -she has taken 2700mg  of the XR in 24 hour period -charcoal given on arrival to ED at noon on 11/19 -she has no tachycardia and mildly prolonged Qtc. Repeat EKG in AM  -continue tele -poison control contacted with recs for monitoring x 18 hours and charcoal  -seizure precautions  -NPO for 8 hours after charcoal, will  place diet after this time  -accidental, mixed up her torsemide pills with her wellbutrin   Mixed anxiety and depressive disorder Hold on wellbutrin  Accidental in nature, no  psychiatry consult at this time   Elevated WBC count Chronic, around baseline    Diabetes mellitus type 2 with complications (HCC) A1C >1 year ago was 6.8, repeat pending  Continue jardiance, hold ozempic  SSI and accuchecks qac/hs   Chronic combined systolic and diastolic heart failure, NYHA class 3 (HCC) Euvolemic  Echo 10/23: EF 50-55% with grade 1 DD  Continue toprol-xl, torsemide and ARB  Daily weights   Essential hypertension Bp soft,  hold home meds diovan, toprol-xl and torsemide   Hypothyroidism Not checked in over a year, repeat pending Continue home synthroid daily   OSA on CPAP Continue cpap at night      Advance Care Planning:   Code Status: Full Code   Consults: none   DVT Prophylaxis: SCDs   Family Communication: husband at bedside   Severity of Illness: The appropriate patient status for this patient is OBSERVATION. Observation status is judged to be reasonable and necessary in order to provide the required intensity of service to ensure the patient's safety. The patient's presenting symptoms, physical exam findings, and initial radiographic and laboratory data in the context of their medical condition is felt to place them at decreased risk for further clinical deterioration. Furthermore, it is anticipated that the patient will be medically stable for discharge from the hospital within 2 midnights of admission.   Author: Orland Mustard, MD 03/26/2023 6:54 PM  For on call review [email or web address].

## 2023-03-26 NOTE — ED Notes (Signed)
Updated poison control

## 2023-03-26 NOTE — Assessment & Plan Note (Signed)
Euvolemic  Echo 10/23: EF 50-55% with grade 1 DD  Continue toprol-xl, torsemide and ARB  Daily weights

## 2023-03-26 NOTE — Assessment & Plan Note (Signed)
Chronic, around baseline

## 2023-03-26 NOTE — Assessment & Plan Note (Addendum)
Bp soft,  hold home meds diovan, toprol-xl and torsemide

## 2023-03-26 NOTE — ED Notes (Signed)
ED TO INPATIENT HANDOFF REPORT  ED Nurse Name and Phone #: Keanan Melander [phone]  S Name/Age/Gender Meredith Diaz 42 y.o. female Room/Bed: 046C/046C  Code Status   Code Status: Full Code  Home/SNF/Other Home Patient oriented to: self, place, time, and situation Is this baseline? Yes   Triage Complete: Triage complete  Chief Complaint Drug overdose [T50.901A]  Triage Note Patient arrives from home reporting that she took 2700mg  of buproprion xl. Patient called poison control who told her to come in, patient appears anxious and tearful at this time. VSS.   Allergies Allergies  Allergen Reactions   Lisinopril Swelling and Other (See Comments)    Angioedema facial, mostly just lips, no trouble breathing    Level of Care/Admitting Diagnosis ED Disposition     ED Disposition  Admit   Condition  --   Comment  Hospital Area: [facility] [[id number]]  Level of Care: Progressive [102]  Admit to Progressive based on following criteria: ACUTE MENTAL DISORDER-RELATED Drug/Alcohol Ingestion/Overdose/Withdrawal, Suicidal Ideation/attempt requiring safety sitter and < Q2h monitoring/assessments, moderate to severe agitation that is managed with medication/sitter, CIWA-Ar score < 20.  May place patient in observation at [facility] or [facility] if equivalent level of care is available:: Yes  Covid Evaluation: Asymptomatic - no recent exposure (last 10 days) testing not required  Diagnosis: Drug overdose [202575]  Admitting Physician: Orland Mustard [[id number]]  Attending Physician: Orland Mustard [[id number]]          B Medical/Surgery History Past Medical History:  Diagnosis Date   Anxiety    Asthma    Back pain    Chronic combined systolic and diastolic CHF (congestive heart failure) (HCC) 07/2011   a. Prior Echo: EF was 25-30%; b. No Sig CAD on CATH; c. No significant finding on Cardiac MRI; d. follow-up Echo 2014 --  EF of 50%;  e. 03/2014 Echo: EF 40-45%, gr 1  DD; f. 11/2014 Echo: EF 40-45%, Gr 2 DD.   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   Depression    Dyspnea    Essential hypertension    Family history of breast cancer    Family history of colon cancer    Fibromyalgia    Gallbladder problem    Hypothyroidism    IBS (irritable bowel syndrome)    Morbid obesity (HCC)    Nonischemic cardiomyopathy (HCC)    a. unclear etiology;  b. 01/2012 Cardiac MRI: no infiltrative pathology, EF 52%;  b. 11/2014 Echo: EF 40-45%, gr 2 DD.   Scoliosis    Sleep apnea    Stomach problems    Thyroid disease    Vitamin B 12 deficiency    Vitamin D deficiency    Past Surgical History:  Procedure Laterality Date   CARDIAC CATHETERIZATION  08/2011   Normal Coronaries - LVEDP 32 mmHg   Cardiac MRI  April 2013   No suggestion of a congenital abnormality; EF ~ 52% - no regional wall motion abnormalities., no increased myocardial signal intensity. No perfusion abnormality.   CHOLECYSTECTOMY  2013   RIGHT AND LEFT HEART CATH N/A 07/10/2018   Procedure: RIGHT AND LEFT HEART CATH;  Surgeon: Laurey Morale, MD;  Location: [facility]VASIVE CV LAB;  Service: Cardiovascular;  Laterality: N/A;   TRANSTHORACIC ECHOCARDIOGRAM  07/2011   Moderate concentric LVH, mildly dilated. EF  25-30% no diastolic dysfunction parameters to suggest elevated LAP   TRANSTHORACIC ECHOCARDIOGRAM  December 2015; July 2016   a)  EF by echo 40-45% with global hypokinesis, mild LVH possible apical variant hypertrophic cardiomyopathy;; b)  EF 40-45%. Normal wall thickness (compared to prior reading of possible apical LVH), grade 2 DD -- with elevated LV filling pressures (however left atrium was read as normal size), normal artery function and size. No no suggestion of pulmonary hypertension     A IV Location/Drains/Wounds Patient  Lines/Drains/Airways Status     Active Line/Drains/Airways     Name Placement date Placement time Site Days   Peripheral IV 05/01/22 20 G Anterior;Left Forearm 05/01/22  1633  Forearm  329            Intake/Output Last 24 hours  Intake/Output Summary (Last 24 hours) at 03/26/2023 1831 Last data filed at 03/26/2023 1630 Gross per 24 hour  Intake 1000 ml  Output --  Net 1000 ml    Labs/Imaging Results for orders placed or performed during the hospital encounter of 03/26/23 (from the past 48 hour(s))  Comprehensive metabolic panel     Status: Abnormal   Collection Time: 03/26/23 12:16 PM  Result Value Ref Range   Sodium 137 135 - 145 mmol/L   Potassium 3.5 3.5 - 5.1 mmol/L   Chloride 101 98 - 111 mmol/L   CO2 25 22 - 32 mmol/L   Glucose, Bld 117 (H) 70 - 99 mg/dL    Comment: Glucose reference range applies only to samples taken after fasting for at least 8 hours.   BUN 14 6 - 20 mg/dL   Creatinine, Ser 0.34 (H) 0.44 - 1.00 mg/dL   Calcium 9.0 8.9 - 74.2 mg/dL   Total Protein 7.2 6.5 - 8.1 g/dL   Albumin 3.3 (L) 3.5 - 5.0 g/dL   AST 17 15 - 41 U/L   ALT 19 0 - 44 U/L   Alkaline Phosphatase 85 38 - 126 U/L   Total Bilirubin 0.4 <1.2 mg/dL   GFR, Estimated 60 (L) >60 mL/min    Comment: (NOTE) Calculated using the CKD-EPI Creatinine Equation (2021)    Anion gap 11 5 - 15    Comment: Performed at [facility], [address], [place], [place] [address]  Magnesium     Status: None   Collection Time: 03/26/23 12:55 PM  Result Value Ref Range   Magnesium 2.2 1.7 - 2.4 mg/dL    Comment: Performed at [facility], [address] [place], [place] [address]  CBC with Differential/Platelet     Status: Abnormal   Collection Time: 03/26/23  1:30 PM  Result Value Ref Range   WBC 13.8 (H) 4.0 - 10.5 K/uL   RBC 4.25 3.87 - 5.11 MIL/uL   Hemoglobin 13.1 12.0 - 15.0 g/dL   HCT 33.2 95.1 - 88.4 %   MCV 92.2 80.0 - 100.0 fL   MCH 30.8 26.0 - 34.0 pg   MCHC 33.4 30.0  - 36.0 g/dL   RDW 16.6 06.3 - 01.6 %   Platelets 391 150 - 400 K/uL   nRBC 0.0 0.0 - 0.2 %   Neutrophils Relative % 69 %   Neutro Abs 9.6 (H) 1.7 - 7.7 K/uL   Lymphocytes Relative 20 %   Lymphs Abs 2.8 0.7 - 4.0 K/uL   Monocytes Relative 8 %   Monocytes Absolute 1.1 (H) 0.1 - 1.0 K/uL   Eosinophils Relative 1 %  Eosinophils Absolute 0.1 0.0 - 0.5 K/uL   Basophils Relative 1 %   Basophils Absolute 0.1 0.0 - 0.1 K/uL   Immature Granulocytes 1 %   Abs Immature Granulocytes 0.08 (H) 0.00 - 0.07 K/uL    Comment: Performed at [facility], [address] [place], [place] [address]  CBG monitoring, ED     Status: Abnormal   Collection Time: 03/26/23  5:14 PM  Result Value Ref Range   Glucose-Capillary 108 (H) 70 - 99 mg/dL    Comment: Glucose reference range applies only to samples taken after fasting for at least 8 hours.   No results found.  Pending Labs Unresulted Labs (From admission, onward)     Start     Ordered   03/27/23 0500  Basic metabolic panel  Tomorrow morning,   R        03/26/23 1459   03/26/23 1458  HIV Antibody (routine testing w rflx)  (HIV Antibody (Routine testing w reflex) panel)  Once,   R        03/26/23 1459   03/26/23 1415  TSH  Once,   R        03/26/23 1414   03/26/23 1415  T4, free  Once,   R        03/26/23 1414   03/26/23 1406  Rapid urine drug screen (hospital performed)  ONCE - STAT,   STAT        03/26/23 1405   03/26/23 1404  Hemoglobin A1c  Once,   URGENT       Comments: To assess prior glycemic control    03/26/23 1403   03/26/23 1255  CBC with Differential  Once,   STAT        03/26/23 1254            Vitals/Pain Today's Vitals   03/26/23 1615 03/26/23 1622 03/26/23 1800 03/26/23 1815  BP: (!) 99/59  104/69 99/71  Pulse: 85  85 85  Resp: 14  (!) 22 (!) 24  Temp:      TempSrc:      SpO2: 100%  (!) 89% 100%  Weight:  92.4 kg    Height:  5' (1.524 m)    PainSc:        Isolation Precautions No active  isolations  Medications Medications  insulin aspart (novoLOG) injection 0-9 Units ( Subcutaneous Not Given 03/26/23 1727)  acetaminophen (TYLENOL) tablet 650 mg (has no administration in time range)    Or  acetaminophen (TYLENOL) suppository 650 mg (has no administration in time range)  metoprolol tartrate (LOPRESSOR) injection 5 mg (has no administration in time range)  LORazepam (ATIVAN) injection 4 mg (has no administration in time range)  charcoal activated (NO SORBITOL) (ACTIDOSE-AQUA) suspension 50 g (50 g Oral Given 03/26/23 1236)  LORazepam (ATIVAN) injection 1 mg (1 mg Intravenous Given 03/26/23 1250)  lactated ringers bolus 1,000 mL (0 mLs Intravenous Stopped 03/26/23 1630)  diazepam (VALIUM) injection 5 mg (5 mg Intravenous Given 03/26/23 1725)  potassium chloride SA (KLOR-CON M) CR tablet 40 mEq (40 mEq Oral Given 03/26/23 1409)    Mobility walks     Focused Assessments Cardiac Assessment Handoff:  Cardiac Rhythm: Normal sinus rhythm Lab Results  Component Value Date   CKTOTAL 47 10/08/2016   TROPONINI <0.03 01/06/2015   Lab Results  Component Value Date   DDIMER 0.93 (H) 05/01/2022   Does the Patient currently have chest pain? No    R Recommendations:  See Admitting Provider Note  Report given to:   Additional Notes:

## 2023-03-26 NOTE — ED Notes (Signed)
Dr. Rogers Blocker at bedside.

## 2023-03-26 NOTE — Assessment & Plan Note (Addendum)
Hold on wellbutrin  Accidental in nature, no psychiatry consult at this time

## 2023-03-26 NOTE — ED Notes (Signed)
Pt ambulatory to and from restroom with steady gait 

## 2023-03-26 NOTE — Assessment & Plan Note (Signed)
Not checked in over a year, repeat pending Continue home synthroid daily

## 2023-03-26 NOTE — Assessment & Plan Note (Signed)
A1C >1 year ago was 6.8, repeat pending  Continue jardiance, hold ozempic  SSI and accuchecks qac/hs

## 2023-03-26 NOTE — Assessment & Plan Note (Signed)
Continue cpap at night  

## 2023-03-27 ENCOUNTER — Encounter (HOSPITAL_COMMUNITY): Payer: Commercial Managed Care - PPO | Admitting: Cardiology

## 2023-03-27 DIAGNOSIS — J45909 Unspecified asthma, uncomplicated: Secondary | ICD-10-CM | POA: Diagnosis not present

## 2023-03-27 DIAGNOSIS — T43202A Poisoning by unspecified antidepressants, intentional self-harm, initial encounter: Secondary | ICD-10-CM | POA: Diagnosis not present

## 2023-03-27 DIAGNOSIS — I5042 Chronic combined systolic (congestive) and diastolic (congestive) heart failure: Secondary | ICD-10-CM | POA: Diagnosis not present

## 2023-03-27 DIAGNOSIS — E039 Hypothyroidism, unspecified: Secondary | ICD-10-CM | POA: Diagnosis not present

## 2023-03-27 DIAGNOSIS — T50901A Poisoning by unspecified drugs, medicaments and biological substances, accidental (unintentional), initial encounter: Secondary | ICD-10-CM | POA: Diagnosis not present

## 2023-03-27 LAB — BASIC METABOLIC PANEL
Anion gap: 10 (ref 5–15)
BUN: 8 mg/dL (ref 6–20)
CO2: 25 mmol/L (ref 22–32)
Calcium: 8.7 mg/dL — ABNORMAL LOW (ref 8.9–10.3)
Chloride: 105 mmol/L (ref 98–111)
Creatinine, Ser: 1.09 mg/dL — ABNORMAL HIGH (ref 0.44–1.00)
GFR, Estimated: >60 mL/min (ref >60)
Glucose, Bld: 117 mg/dL — ABNORMAL HIGH (ref 70–99)
Potassium: 3.5 mmol/L (ref 3.5–5.1)
Sodium: 140 mmol/L (ref 135–145)

## 2023-03-27 LAB — HEMOGLOBIN A1C
Hgb A1c MFr Bld: 5.6 % (ref 4.8–5.6)
Mean Plasma Glucose: 114.02 mg/dL

## 2023-03-27 LAB — T4, FREE: Free T4: 1.03 ng/dL (ref 0.61–1.12)

## 2023-03-27 LAB — GLUCOSE, CAPILLARY
Glucose-Capillary: 129 mg/dL — ABNORMAL HIGH (ref 70–99)
Glucose-Capillary: 147 mg/dL — ABNORMAL HIGH (ref 70–99)

## 2023-03-27 LAB — HIV ANTIBODY (ROUTINE TESTING W REFLEX): HIV Screen 4th Generation wRfx: NONREACTIVE

## 2023-03-27 LAB — TSH: TSH: 8.523 u[IU]/mL — ABNORMAL HIGH (ref 0.350–4.500)

## 2023-03-27 MED ORDER — POTASSIUM CHLORIDE CRYS ER 20 MEQ PO TBCR
40.0000 meq | EXTENDED_RELEASE_TABLET | Freq: Once | ORAL | Status: AC
  Administered 2023-03-27: 40 meq via ORAL
  Filled 2023-03-27: qty 2

## 2023-03-27 MED ORDER — BUPROPION HCL ER (XL) 300 MG PO TB24: 300.0000 mg | ORAL_TABLET | Freq: Every day | ORAL | Status: DC

## 2023-03-27 NOTE — Plan of Care (Signed)
  Problem: Education: Goal: Ability to describe self-care measures that may prevent or decrease complications (Diabetes Survival Skills Education) will improve Outcome: Completed/Met Goal: Individualized Educational Video(s) Outcome: Completed/Met   Problem: Coping: Goal: Ability to adjust to condition or change in health will improve Outcome: Completed/Met   Problem: Fluid Volume: Goal: Ability to maintain a balanced intake and output will improve Outcome: Completed/Met   Problem: Health Behavior/Discharge Planning: Goal: Ability to identify and utilize available resources and services will improve Outcome: Completed/Met Goal: Ability to manage health-related needs will improve Outcome: Completed/Met   Problem: Metabolic: Goal: Ability to maintain appropriate glucose levels will improve Outcome: Completed/Met   Problem: Nutritional: Goal: Maintenance of adequate nutrition will improve Outcome: Completed/Met Goal: Progress toward achieving an optimal weight will improve Outcome: Completed/Met   Problem: Skin Integrity: Goal: Risk for impaired skin integrity will decrease Outcome: Completed/Met   Problem: Tissue Perfusion: Goal: Adequacy of tissue perfusion will improve Outcome: Completed/Met   Problem: Education: Goal: Knowledge of General Education information will improve Description: Including pain rating scale, medication(s)/side effects and non-pharmacologic comfort measures Outcome: Completed/Met   Problem: Health Behavior/Discharge Planning: Goal: Ability to manage health-related needs will improve Outcome: Completed/Met   Problem: Clinical Measurements: Goal: Ability to maintain clinical measurements within normal limits will improve Outcome: Completed/Met Goal: Will remain free from infection Outcome: Completed/Met Goal: Diagnostic test results will improve Outcome: Completed/Met Goal: Respiratory complications will improve Outcome: Completed/Met Goal:  Cardiovascular complication will be avoided Outcome: Completed/Met   Problem: Activity: Goal: Risk for activity intolerance will decrease Outcome: Completed/Met   Problem: Nutrition: Goal: Adequate nutrition will be maintained Outcome: Completed/Met   Problem: Coping: Goal: Level of anxiety will decrease Outcome: Completed/Met   Problem: Elimination: Goal: Will not experience complications related to bowel motility Outcome: Completed/Met Goal: Will not experience complications related to urinary retention Outcome: Completed/Met   Problem: Pain Management: Goal: General experience of comfort will improve Outcome: Completed/Met   Problem: Safety: Goal: Ability to remain free from injury will improve Outcome: Completed/Met   Problem: Skin Integrity: Goal: Risk for impaired skin integrity will decrease Outcome: Completed/Met

## 2023-03-27 NOTE — Discharge Instructions (Addendum)
Follow with Primary MD Myrlene Broker, MD in 2-3 days   Get CBC, CMP, Magnesium, TSH, free T4, EKG -  checked next visit with your primary MD    Activity: As tolerated with Full fall precautions use walker/cane & assistance as needed  Disposition Home    Diet: Heart Healthy low carbohydrate diet, 1.5 L fluid restriction per day.  Check CBGs q. [facility] S.  Special Instructions: If you have smoked or chewed Tobacco  in the last 2 yrs please stop smoking, stop any regular Alcohol  and or any Recreational drug use.  On your next visit with your primary care physician please Get Medicines reviewed and adjusted.  Please request your Prim.MD to go over all Hospital Tests and Procedure/Radiological results at the follow up, please get all Hospital records sent to your Prim MD by signing hospital release before you go home.  If you experience worsening of your admission symptoms, develop shortness of breath, life threatening emergency, suicidal or homicidal thoughts you must seek medical attention immediately by calling 911 or calling your MD immediately  if symptoms less severe.  You Must read complete instructions/literature along with all the possible adverse reactions/side effects for all the Medicines you take and that have been prescribed to you. Take any new Medicines after you have completely understood and accpet all the possible adverse reactions/side effects.   Do not drive when taking Pain medications.  Do not take more than prescribed Pain, Sleep and Anxiety Medications

## 2023-03-27 NOTE — TOC Transition Note (Signed)
Transition of Care [facility]) - CM/SW Discharge Note   Patient Details  Name: Meredith Diaz MRN: [id number] Date of Birth: 1980/05/27  Transition of [facility]) CM/SW Contact:  Gordy Clement, RN Phone Number: 03/27/2023, 9:00 AM   Clinical Narrative:           Patient to dc to home today.  Patient has PCP and is insured through [facility] PPO   There are no TOC needs    Patient Goals and CMS Choice      Discharge Placement                         Discharge Plan and Services Additional resources added to the After Visit Summary for                                       Social Determinants of Health (SDOH) Interventions SDOH Screenings   Food Insecurity: No Food Insecurity (03/27/2023)  Housing: Low Risk  (03/27/2023)  Transportation Needs: No Transportation Needs (03/27/2023)  Utilities: Not At Risk (03/27/2023)  Alcohol Screen: Low Risk  (08/11/2019)  Depression (PHQ2-9): High Risk (05/16/2022)  Financial Resource Strain: Medium Risk (08/11/2019)  Physical Activity: Inactive (08/11/2019)  Social Connections: Socially Isolated (08/11/2019)  Stress: Stress Concern Present (08/11/2019)  Tobacco Use: Low Risk  (03/26/2023)     Readmission Risk Interventions     No data to display

## 2023-03-27 NOTE — Discharge Summary (Signed)
Meredith Diaz UVO:[id number] DOB: 1981-03-21 DOA: 03/26/2023  PCP: Meredith Broker, MD  Admit date: 03/26/2023  Discharge date: 03/27/2023  Admitted From: Home   Disposition:  Home   Recommendations for Outpatient Follow-up:   Follow up with PCP in 1-2 weeks  PCP Please obtain BMP/CBC, 2 view CXR in 1week,  (see Discharge instructions)   PCP Please follow up on the following pending results:    Home Health: None   Equipment/Devices: None  Consultations: None  Discharge Condition: Stable    CODE STATUS: Full    Diet Recommendation: Heart Healthy Low Carb, 1.5 L fluid restriction per day  Chief Complaint  Patient presents with   Drug Overdose     Brief history of present illness from the day of admission and additional interim summary    42 y.o. female with medical history significant of GAD and depression, HTN, hypothyroidism, OSA on cpap, T2DM, combined CHF, asthma who presented to ED after overdosing on her wellbutrin. She took 2700mg  over the past 24 hours.(300mg  XR pills)  This was accidental. She got her torsemide confused with her Wellbutrin. She typically takes 4 tablets of her torsemide in the AM and accidentally took Wellbutrin. She then took another at nighttime, which was her normal wellbutrin dose. This morning she took another 4 pill of her wellbutrin and realized that she had mixed up her torsemide so came to ED.  Last night she felt her heart racing and thought she was going to jump out of her skin. She had some nausea this AM as well and after they gave her charcoal.    Denies any fever/chills, vision changes/headaches, chest pain, shortness of breath or cough, abdominal pain, V/D, dysuria or leg swelling.    She does not smoke or drink alcohol.                                                                   Hospital Course   Accidental drug overdose 42 year old presenting to ED after accidental wellbutrin overdose -obs to progressive -she has taken 2700mg  of the XR in 24 hour period -charcoal given on arrival to ED at noon on 11/19 -she has no tachycardia and mildly prolonged Qtc. Repeat EKG morning she is stable QTc and QRS, patient is symptom-free.  Will be discharged home.  Wellbutrin held for another 5 days postdischarge.  Request PCP to recheck CBC, BMP, magnesium, TSH, free T4 and EKG in 2 to 3 days.   Mixed anxiety and depressive disorder Hold on wellbutrin for another 5 days. Accidental in nature, no psychiatry consult at this time not suicidal homicidal, confirms this was accidental.   Elevated WBC count Chronic, around baseline     Diabetes mellitus type 2 with complications (HCC) No acute issue, PCP to  monitor glycemic control.   Chronic combined systolic and diastolic heart failure, NYHA class 3 (HCC) Euvolemic  Echo 10/23: EF 50-55% with grade 1 DD  Continue regimen from tomorrow.  Compensated.   Essential hypertension Blood pressure stable this morning resume home regimen from tomorrow.   Hypothyroidism TSH mildly elevated could be sick euthyroid, free T4 stable, PCP to recheck in 7 to 10 days. Continue home synthroid daily    OSA on CPAP Continue cpap at night     Discharge diagnosis     Principal Problem:   accidental drug overdose Active Problems:   Mixed anxiety and depressive disorder   Elevated WBC count   Diabetes mellitus type 2 with complications (HCC)   Chronic combined systolic and diastolic heart failure, NYHA class 3 (HCC)   Essential hypertension   Hypothyroidism   OSA on CPAP    Discharge instructions    Discharge Instructions     Discharge instructions   Complete by: As directed    Follow with Primary MD Meredith Broker, MD in 2-3 days   Get CBC, CMP, Magnesium, TSH, free T4, EKG -   checked next visit with your primary MD    Activity: As tolerated with Full fall precautions use walker/cane & assistance as needed  Disposition Home    Diet: Heart Healthy low carbohydrate diet, 1.5 L fluid restriction per day.  Check CBGs q. [facility] S.  Special Instructions: If you have smoked or chewed Tobacco  in the last 2 yrs please stop smoking, stop any regular Alcohol  and or any Recreational drug use.  On your next visit with your primary care physician please Get Medicines reviewed and adjusted.  Please request your Prim.MD to go over all Hospital Tests and Procedure/Radiological results at the follow up, please get all Hospital records sent to your Prim MD by signing hospital release before you go home.  If you experience worsening of your admission symptoms, develop shortness of breath, life threatening emergency, suicidal or homicidal thoughts you must seek medical attention immediately by calling 911 or calling your MD immediately  if symptoms less severe.  You Must read complete instructions/literature along with all the possible adverse reactions/side effects for all the Medicines you take and that have been prescribed to you. Take any new Medicines after you have completely understood and accpet all the possible adverse reactions/side effects.   Do not drive when taking Pain medications.  Do not take more than prescribed Pain, Sleep and Anxiety Medications   Increase activity slowly   Complete by: As directed        Discharge Medications   Allergies as of 03/27/2023       Reactions   Lisinopril Swelling, Other (See Comments)   Angioedema facial, mostly just lips, no trouble breathing        Medication List     TAKE these medications    albuterol (2.5 MG/3ML) 0.083% nebulizer solution Commonly known as: PROVENTIL Take 3 mLs (2.5 mg total) by nebulization every 6 (six) hours as needed for wheezing or shortness of breath. What changed: Another medication with  the same name was changed. Make sure you understand how and when to take each.   albuterol 108 (90 Base) MCG/ACT inhaler Commonly known as: VENTOLIN HFA TAKE 2 PUFFS BY MOUTH EVERY 6 HOURS AS NEEDED FOR WHEEZE OR SHORTNESS OF BREATH What changed: See the new instructions.   budesonide-formoterol 160-4.5 MCG/ACT inhaler Commonly known as: Breyna Inhale 2 puffs into the  lungs 2 (two) times daily.   buPROPion 300 MG 24 hr tablet Commonly known as: WELLBUTRIN XL Take 1 tablet (300 mg total) by mouth daily. Start taking on: April 01, 2023 What changed: These instructions start on April 01, 2023. If you are unsure what to do until then, ask your doctor or other care provider.   cholestyramine light 4 g packet Commonly known as: PREVALITE Take 4 g by mouth as needed.   clobetasol cream 0.05 % Commonly known as: TEMOVATE Apply 1 application topically See admin instructions. Apply daily to affected areas of genital area as directed   empagliflozin 10 MG Tabs tablet Commonly known as: Jardiance Take 1 tablet (10 mg total) by mouth daily before breakfast.   levothyroxine 112 MCG tablet Commonly known as: SYNTHROID Take 1 tablet (112 mcg total) by mouth daily.   metoprolol succinate 25 MG 24 hr tablet Commonly known as: TOPROL-XL TAKE 1 TABLET BY MOUTH DAILY PLEASE KEEP FOLLOW UP APPOINTMENT What changed: See the new instructions.   Ozempic (1 MG/DOSE) 4 MG/3ML Sopn Generic drug: Semaglutide (1 MG/DOSE) Inject 1 mg into the skin once a week.   potassium chloride SA 20 MEQ tablet Commonly known as: KLOR-CON M Take 1 tablet (20 mEq total) by mouth 2 (two) times daily.   torsemide 20 MG tablet Commonly known as: DEMADEX TAKE 4 TABLETS BY MOUTH IN THE MORNING AND 3 TABLET EVERY EVENING. ABSOLUTE LAST REFILL WITHOUT OFFICE VISIT PLEASE CALL [phone] TO SCHEDULE. What changed: See the new instructions.   valsartan 80 MG tablet Commonly known as: DIOVAN TAKE 1 TABLET BY  MOUTH EVERY DAY PLEASE KEEP FOLLOW UP APPOINTMENT What changed: See the new instructions.         Follow-up Information     Meredith Broker, MD. Schedule an appointment as soon as possible for a visit in 2 day(s).   Specialty: Internal Medicine Contact information: [address] [place] [address] [phone]                 Major procedures and Radiology Reports - PLEASE review detailed and final reports thoroughly  -      No results found.  Micro Results     No results found for this or any previous visit (from the past 240 hour(s)).  Today   Subjective    Meredith Diaz today has no headache,no chest abdominal pain,no new weakness tingling or numbness, feels much better wants to go home today.     Objective   Blood pressure 108/79, pulse 87, temperature (!) 97.2 F (36.2 C), temperature source Oral, resp. rate 17, height 5' (1.524 m), weight 92.4 kg, SpO2 99%.   Intake/Output Summary (Last 24 hours) at 03/27/2023 0731 Last data filed at 03/26/2023 1630 Gross per 24 hour  Intake 1000 ml  Output --  Net 1000 ml    Exam  Awake Alert, No new F.N deficits,    [place].AT,PERRAL Supple Neck,   Symmetrical Chest wall movement, Good air movement bilaterally, CTAB RRR,No Gallops,   +ve B.Sounds, Abd Soft, Non tender,  No Cyanosis, Clubbing or edema    Data Review   Recent Labs  Lab 03/26/23 1330  WBC 13.8*  HGB 13.1  HCT 39.2  PLT 391  MCV 92.2  MCH 30.8  MCHC 33.4  RDW 13.9  LYMPHSABS 2.8  MONOABS 1.1*  EOSABS 0.1  BASOSABS 0.1    Recent Labs  Lab 03/26/23 1216 03/26/23 1255 03/27/23 0010  NA 137  --  140  K 3.5  --  3.5  CL 101  --  105  CO2 25  --  25  ANIONGAP 11  --  10  GLUCOSE 117*  --  117*  BUN 14  --  8  CREATININE 1.16*  --  1.09*  AST 17  --   --   ALT 19  --   --   ALKPHOS 85  --   --   BILITOT 0.4  --   --   ALBUMIN 3.3*  --   --   TSH  --   --  8.523*  HGBA1C  --   --  5.6  MG  --  2.2  --    CALCIUM 9.0  --  8.7*    Total Time in preparing paper work, data evaluation and todays exam - 35 minutes  Signature  -    Susa Raring M.D on 03/27/2023 at 7:31 AM   -  To page go to [email or web address]

## 2023-03-27 NOTE — Progress Notes (Signed)
   03/27/23 0020  BiPAP/CPAP/SIPAP  $ Non-Invasive Ventilator  Non-Invasive Vent Set Up;Non-Invasive Vent Initial  $ Face Mask Small Yes  BiPAP/CPAP/SIPAP Pt Type Adult  BiPAP/CPAP/SIPAP Resmed  Mask Type Full face mask  Mask Size Small  IPAP  (10 max)  EPAP  (5 min)  FiO2 (%) 21 %  Patient Home Equipment No  Auto Titrate Yes   RT set up bipap and has it set up next to patients bed.    Pt states she is comfortable placing herself on when she is ready to sleep.  RT will continue to monitor

## 2023-03-27 NOTE — Plan of Care (Signed)

## 2023-03-29 ENCOUNTER — Encounter (HOSPITAL_COMMUNITY): Payer: Self-pay | Admitting: Cardiology

## 2023-03-29 ENCOUNTER — Ambulatory Visit (HOSPITAL_COMMUNITY)
Admit: 2023-03-29 | Discharge: 2023-03-29 | Disposition: A | Discharge status: Home / Self Care | Payer: Commercial Managed Care - PPO | Attending: Cardiology | Admitting: Cardiology

## 2023-03-29 VITALS — BP 120/82 | HR 99 | Wt 203.0 lb

## 2023-03-29 DIAGNOSIS — Z7984 Long term (current) use of oral hypoglycemic drugs: Secondary | ICD-10-CM | POA: Diagnosis not present

## 2023-03-29 DIAGNOSIS — Z79899 Other long term (current) drug therapy: Secondary | ICD-10-CM | POA: Insufficient documentation

## 2023-03-29 DIAGNOSIS — Z6839 Body mass index (BMI) 39.0-39.9, adult: Secondary | ICD-10-CM | POA: Diagnosis not present

## 2023-03-29 DIAGNOSIS — I5022 Chronic systolic (congestive) heart failure: Secondary | ICD-10-CM | POA: Diagnosis present

## 2023-03-29 DIAGNOSIS — I5042 Chronic combined systolic (congestive) and diastolic (congestive) heart failure: Secondary | ICD-10-CM | POA: Diagnosis not present

## 2023-03-29 DIAGNOSIS — E669 Obesity, unspecified: Secondary | ICD-10-CM | POA: Insufficient documentation

## 2023-03-29 DIAGNOSIS — G4733 Obstructive sleep apnea (adult) (pediatric): Secondary | ICD-10-CM | POA: Diagnosis not present

## 2023-03-29 DIAGNOSIS — I428 Other cardiomyopathies: Secondary | ICD-10-CM | POA: Insufficient documentation

## 2023-03-29 MED ORDER — POTASSIUM CHLORIDE CRYS ER 20 MEQ PO TBCR: 20.0000 meq | EXTENDED_RELEASE_TABLET | Freq: Two times a day (BID) | ORAL | 3 refills | Status: DC

## 2023-03-29 MED ORDER — VALSARTAN 80 MG PO TABS: 80.0000 mg | ORAL_TABLET | Freq: Every day | ORAL | 3 refills | Status: DC

## 2023-03-29 MED ORDER — METOPROLOL SUCCINATE ER 25 MG PO TB24: 25.0000 mg | ORAL_TABLET | Freq: Every day | ORAL | 3 refills | Status: DC

## 2023-03-29 MED ORDER — EMPAGLIFLOZIN 10 MG PO TABS: 10.0000 mg | ORAL_TABLET | Freq: Every day | ORAL | 11 refills | Status: DC

## 2023-03-29 MED ORDER — SPIRONOLACTONE 25 MG PO TABS: 25.0000 mg | ORAL_TABLET | Freq: Every day | ORAL | 3 refills | Status: DC

## 2023-03-29 MED ORDER — TORSEMIDE 20 MG PO TABS: ORAL_TABLET | ORAL | 4 refills | Status: DC

## 2023-03-29 NOTE — Patient Instructions (Signed)
START Spironolactone 25 mg daily.  Blood work in 10 days  Your physician has requested that you have an echocardiogram. Echocardiography is a painless test that uses sound waves to create images of your heart. It provides your doctor with information about the size and shape of your heart and how well your heart's chambers and valves are working. This procedure takes approximately one hour. There are no restrictions for this procedure. Please do NOT wear cologne, perfume, aftershave, or lotions (deodorant is allowed). Please arrive 15 minutes prior to your appointment time.  Please note: We ask at that you not bring children with you during ultrasound (echo/ vascular) testing. Due to room size and safety concerns, children are not allowed in the ultrasound rooms during exams. Our front office staff cannot provide observation of children in our lobby area while testing is being conducted. An adult accompanying a patient to their appointment will only be allowed in the ultrasound room at the discretion of the ultrasound technician under special circumstances. We apologize for any inconvenience.  Your physician recommends that you schedule a follow-up appointment in: 6 months.     If you have any questions or concerns before your next appointment please send [place] a message through [facility] or call our office at [phone].    TO LEAVE A MESSAGE FOR THE NURSE SELECT OPTION 2, PLEASE LEAVE A MESSAGE INCLUDING: YOUR NAME DATE OF BIRTH CALL BACK NUMBER REASON FOR CALL**this is important as we prioritize the call backs  YOU WILL RECEIVE A CALL BACK THE SAME DAY AS LONG AS YOU CALL BEFORE 4:00 PM  At the [facility], you and your health needs are our priority. As part of our continuing mission to provide you with exceptional heart care, we have created designated Provider Care Teams. These Care Teams include your primary Cardiologist (physician) and Advanced Practice Providers (APPs-  Physician Assistants and Nurse Practitioners) who all work together to provide you with the care you need, when you need it.   You may see any of the following providers on your designated Care Team at your next follow up: Dr Arvilla Meres Dr Marca Ancona Dr. Dorthula Nettles Dr. Clearnce Hasten Amy Filbert Schilder, NP Robbie Lis, [place] [facility] [place], [place] Brynda Peon, NP [place] Lee, NP Karle Plumber, PharmD   Please be sure to bring in all your medications bottles to every appointment.    Thank you for choosing [facility]

## 2023-03-31 NOTE — Progress Notes (Signed)
Date:  03/31/2023   ID:  Meredith Diaz, DOB 1980/09/13, MRN [id number]  Provider location: [facility] Type of Visit: Established patient   PCP:  Myrlene Broker, MD  Cardiologist:  Dr. Shirlee Latch   History of Present Illness: Meredith Diaz is a 42 y.o. female who has a history of nonischemic cardiomyopathy.  This was diagnosed in 2013.  EF was initially 25-30%. Cardiac cath at that time showed no significant CAD.  Cardiac MRI showed no late gadoliniuim enhancement.  Since that time, EF has remained low but has improved a bit.  Most recent echo in 11/17 showed EF 45-50% with diffuse hypokinesis.  She also has asthma.  PFTs showed a severe restrictive defect that may be due to body habitus primarily.    She was admitted in 8/16 with chest pain.  CTA chest showed no PE, lung parenchyma looked normal.  She had ETT with poor exercise tolerance but no evidence for ischemia.  CPX was done in 12/16, showing moderate functional limitation due primarily to lung restriction.    Admitted 11/4 - 03/13/18 for increased DOE. Diuresed well with IV lasix and treated for asthma exacerbation. Treated with solumedrol and prednisone burst. Echo with EF 50-55%, mildly dilated/dysfunctional RV, septal bounce noted but IVC normal in size.    Due to increased dyspnea, RHC was done in 3/20.  Right and left heart filling pressures were elevated with near equalization of pressures suggesting restrictive cardiomyopathy.  No evidence of pericardial constriction.  I increased her torsemide to 40 mg bid.   She has seen pulmonary.  No evidence for interstitial lung disease.  Autoimmune workup was negative.  She completed pulmonary rehab.   Echo in 10/23 showed EF 50-55%, normal RV, normal IVC.   She returns for followup of CHF.  She has lost about 30 lbs.  She has not needed oxygen as much during the day, using CPAP at night. She was on Ozempic but then had to stop it due to  insurance changes.  Overall, breathing has been better.  She is walking longer distances with mild dyspnea.  She is short of breath with stairs.  No chest pain. No lightheadedness.  No orthopnea/PND.   ECG (personally reviewed): NSR   Labs (9/16): K 3.2, creatinine 1.11, hgb 14.3 Labs (12/16): K 3.3, creatinine 0.96, BNP 23 Labs (2/17): K 4.7, creatinine 0.95, TSH normal Labs (8/17): K 3.9, creatinine 0.93 Labs (12/18): K 3.6, creatinine 0.82, pro-BNP 42, TSH normal Labs (12/19): K 4.4, creatinine 0.9 Labs (2/20): BNP 47, hgb 13.1, K 4.4, creatinine 0.8 Labs (5/20): K 4.2, creatinine 1.01 Labs (9/20): K 3.8, creatinine 0.85, Rf negative, CCP negative, ANA negative, dsDNA negative, SCL-70 negative Labs (11/20): K 3.4, creatinine 0.96 Labs (8/21): BNP 49, K 4.3, creatinine 0.87 Labs (3/23): K 4.3, creatinine 1.0 Labs (11/24): K 3.5, creatinine 1.09   PMH: 1. Chronic systolic CHF: Nonischemic cardiomyopathy.  - EF 25-30% by echo in 2013.  - Cardiac MRI (2013) with EF 52%, no delayed enhancement.  - LHC in 2013 without significant CAD. - 11/15 echo with EF 40-45%.   - 7/16 echo with EF 40-45%, grade II diastolic dysfunction.  - ETT (4/09) with poor exercise tolerance but no ischemia.  - CPX (12/16): peak VO2 15.7 (23.5 adjusted for ideal body weight), VE/VCO2 slope 21, RER 1.15 => moderate functional limitation thought to be primarily due to restrictive lung physiology.  - Echo (11/17): EF 45-50%, mild diffuse hypokinesis,  normal RV size and systolic function.  - Echo (11/19): EF 50-55%, mildly dilated and mildly dysfunctional RV with respirophasic septal bounce noted, normal IVC and PA pressure estimate.  - RHC (3/20): mean 19, PA 54/18 mean 38, mean PCWP 23, CI 2.77, PVR 2.79 WU. Equalization of pressures suggestive of restrictive cardiomyopathy, not suggestive of pericardial constriction.  - Echo (10/23): EF 50-55%, normal RV, normal IVC. 2. Asthma: PFTs (4/16) with mild obstruction,  severe restriction and severely decreased DLCO => think primarily due to body habitus.  3. Depression 4. Hypothyroidism 5. 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  6. Angioedema with ACEI 7. OHS/OSA: CPAP at night, oxygen during the day.  8. Chronic leukocytosis: Negative workup.  9. BPPV 10. Obesity  11. Scoliosis: CT chest (5/20) showed no ILD, severe scoliosis.     Current Outpatient Medications  Medication Sig Dispense Refill   albuterol (PROVENTIL) (2.5 MG/3ML) 0.083% nebulizer solution Take 3 mLs (2.5 mg total) by nebulization every 6 (six) hours as needed for wheezing or shortness of breath. 75 mL 12   albuterol (VENTOLIN HFA) 108 (90 Base) MCG/ACT inhaler TAKE 2 PUFFS BY MOUTH EVERY 6 HOURS AS NEEDED FOR WHEEZE OR SHORTNESS OF BREATH (Patient taking differently: Inhale 2 puffs into the lungs every 6 (six) hours as needed for shortness of breath.) 8.5 each 6   budesonide-formoterol (BREYNA) 160-4.5 MCG/ACT inhaler Inhale 2 puffs into the lungs 2 (two) times daily. 1 each 6   [START ON 04/01/2023] buPROPion (WELLBUTRIN XL) 300 MG 24 hr tablet Take 1 tablet (300 mg total) by mouth daily.     cholestyramine light (PREVALITE) 4 g packet Take 4 g by mouth as needed.     clobetasol cream (TEMOVATE) 0.05 % Apply 1 application topically See admin instructions. Apply daily to affected areas of genital area as directed     levothyroxine (SYNTHROID) 112 MCG tablet Take 1 tablet (112 mcg total) by mouth daily. 90 tablet 3   spironolactone (ALDACTONE) 25 MG tablet Take 1 tablet (25 mg total) by mouth daily. 90 tablet 3   empagliflozin (JARDIANCE) 10 MG TABS tablet Take 1 tablet (10 mg total) by mouth daily before breakfast. 30 tablet 11   metoprolol succinate (TOPROL-XL) 25 MG 24 hr tablet Take 1 tablet (25 mg total) by mouth daily. 90 tablet 3   potassium chloride SA (KLOR-CON M) 20 MEQ tablet Take 1 tablet (20 mEq total) by mouth 2 (two) times daily. 180 tablet 3   Semaglutide, 1  MG/DOSE, (OZEMPIC, 1 MG/DOSE,) 4 MG/3ML SOPN Inject 1 mg into the skin once a week. (Patient not taking: Reported on 03/29/2023) 3 mL 1   torsemide (DEMADEX) 20 MG tablet Take 4 tablets (80 mg total) by mouth every morning AND 3 tablets (60 mg total) every evening. 240 tablet 4   valsartan (DIOVAN) 80 MG tablet Take 1 tablet (80 mg total) by mouth daily. 90 tablet 3   No current facility-administered medications for this encounter.    Allergies:   Lisinopril   Social History:  The patient  reports that she has never smoked. She has never used smokeless tobacco. She reports that she does not drink alcohol and does not use drugs.   Family History:  The patient's family history includes Alcoholism in her mother; Asthma in her paternal grandmother; Breast cancer (age of onset: 46) in her paternal grandmother; Breast cancer (age of onset: 34) in her maternal aunt and paternal aunt; Colon cancer (age of onset: 38) in  her maternal grandmother; Depression in her mother; Heart attack in her maternal grandfather, maternal grandmother, paternal grandfather, and paternal grandmother; Heart disease in her father, maternal grandfather, and paternal grandmother; Hypertension in her father.   ROS:  Please see the history of present illness.   All other systems are personally reviewed and negative.   Exam:   BP 120/82   Pulse 99   Wt 92.1 kg (203 lb)   SpO2 92%   BMI 39.65 kg/m  General: NAD Neck: No JVD, no thyromegaly or thyroid nodule.  Lungs: Clear to auscultation bilaterally with normal respiratory effort. CV: Nondisplaced PMI.  Heart regular S1/S2, no S3/S4, no murmur.  No peripheral edema.  No carotid bruit.  Normal pedal pulses.  Abdomen: Soft, nontender, no hepatosplenomegaly, no distention.  Skin: Intact without lesions or rashes.  Neurologic: Alert and oriented x 3.  Psych: Normal affect. Extremities: No clubbing or cyanosis.  HEENT: Normal.   Recent Labs: 05/01/2022: Pro B Natriuretic  peptide (BNP) 64.0 03/26/2023: ALT 19; Hemoglobin 13.1; Magnesium 2.2; Platelets 391 03/27/2023: BUN 8; Creatinine, Ser 1.09; Potassium 3.5; Sodium 140; TSH 8.523  Personally reviewed   Wt Readings from Last 3 Encounters:  03/29/23 92.1 kg (203 lb)  03/26/23 92.4 kg (203 lb 11.3 oz)  12/18/22 92.4 kg (203 lb 9.6 oz)     ASSESSMENT AND PLAN:  1. Chronic systolic CHF: Nonischemic cardiomyopathy.  No CAD on prior cath, prior cMRI showed no late gadolinium enhancement.  No family history of cardiomyopathy.  No ETOH/substance abuse.  No uncontrolled HTN.  Possible that she had viral myocarditis. Echo in 03/11/18 showed LVEF up to 50-55% but with mildly dilated/dysfunctional RV, IVC appeared normal and PA pressure estimation was normal, septal bounce was noted.  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  No ventricular interdependence to suggest pericardial constriction. Echo in 10/23 showed EF 50-55%, normal RV, normal IVC.  NYHA class II, not significantly volume overloaded on exam. I think that a lot of her symptomatology is due to restriction from excess weight => OHS/OSA, this has improved with weight loss. .  - Continue torsemide 80 mg daily, BMET/BNP today.  - Continue Jardiance 10 mg daily.  - Continue Toprol XL 25 mg daily.  - Increase spironolactone to 25 mg daily.  BMET in 10 days.  - Continue valsartan 80 mg daily.   - I will arrange for echo.  2. Asthma: No wheezing on exam.  3. OHS/OSA: She uses CPAP at night and oxygen during the day.  OHS is likely due to lung restriction from obesity and scoliosis, CT chest in 5/20 showed no ILD but did show severe scoliosis.  I think OHS/OSA plays a large role in her dyspnea.  Symptoms and oxygen requirement improving with weight loss.   4. Obesity: Weight loss is imperative.  She tried [facility] but did not find it helpful.   Not interested in bariatric surgery right now. She was able to take semaglutide for a while, then insurance changed and does not cover.   - Will check with her insurance after the new year to see if she can get back on semaglutide.   Followup in 6 months with APP if echo is stable.   Signed, Marca Ancona, MD  03/31/2023  [facility] [address] [facility] [place] [address] [phone] (office) [phone] (fax)

## 2023-04-09 ENCOUNTER — Ambulatory Visit (HOSPITAL_COMMUNITY)
Admission: RE | Admit: 2023-04-09 | Discharge: 2023-04-09 | Disposition: A | Discharge status: Home / Self Care | Payer: Commercial Managed Care - PPO | Source: Ambulatory Visit | Attending: Cardiology | Admitting: Cardiology

## 2023-04-09 DIAGNOSIS — I5042 Chronic combined systolic (congestive) and diastolic (congestive) heart failure: Secondary | ICD-10-CM | POA: Diagnosis present

## 2023-04-09 LAB — BASIC METABOLIC PANEL
Anion gap: 13 (ref 5–15)
BUN: 16 mg/dL (ref 6–20)
CO2: 30 mmol/L (ref 22–32)
Calcium: 9.1 mg/dL (ref 8.9–10.3)
Chloride: 96 mmol/L — ABNORMAL LOW (ref 98–111)
Creatinine, Ser: 1.17 mg/dL — ABNORMAL HIGH (ref 0.44–1.00)
GFR, Estimated: 59 mL/min — ABNORMAL LOW (ref >60)
Glucose, Bld: 122 mg/dL — ABNORMAL HIGH (ref 70–99)
Potassium: 3.2 mmol/L — ABNORMAL LOW (ref 3.5–5.1)
Sodium: 139 mmol/L (ref 135–145)

## 2023-04-10 ENCOUNTER — Telehealth (HOSPITAL_COMMUNITY): Payer: Self-pay

## 2023-04-10 DIAGNOSIS — I5042 Chronic combined systolic (congestive) and diastolic (congestive) heart failure: Secondary | ICD-10-CM

## 2023-04-10 MED ORDER — POTASSIUM CHLORIDE CRYS ER 20 MEQ PO TBCR: 60.0000 meq | EXTENDED_RELEASE_TABLET | Freq: Every day | ORAL | 5 refills | Status: DC

## 2023-04-10 NOTE — Telephone Encounter (Signed)
Patient advised and verbalized understanding,lab appointment scheduled,lab orders entered, med list updated to reflect changes.   Meds ordered this encounter  Medications   potassium chloride SA (KLOR-CON M) 20 MEQ tablet    Sig: Take 3 tablets (60 mEq total) by mouth daily.    Dispense:  90 tablet    Refill:  5    Please cancel all previous orders for current medication. Change in dosage or pill size.   Orders Placed This Encounter  Procedures   Basic metabolic panel    Standing Status:   Future    Standing Expiration Date:   04/09/2024    Order Specific Question:   Release to patient    Answer:   Immediate    Order Specific Question:   Release to patient    Answer:   Immediate [1]

## 2023-04-10 NOTE — Telephone Encounter (Signed)
-----   Message from [employer] sent at 04/09/2023 11:14 PM EST ----- Increase total daily KCl by 20 mEq and check BMET 1 week.

## 2023-04-15 ENCOUNTER — Other Ambulatory Visit: Payer: Self-pay | Admitting: Internal Medicine

## 2023-04-17 ENCOUNTER — Ambulatory Visit (HOSPITAL_COMMUNITY)
Admission: RE | Admit: 2023-04-17 | Discharge: 2023-04-17 | Disposition: A | Discharge status: Home / Self Care | Payer: Commercial Managed Care - PPO | Source: Ambulatory Visit | Attending: Cardiology | Admitting: Cardiology

## 2023-04-17 DIAGNOSIS — I5042 Chronic combined systolic (congestive) and diastolic (congestive) heart failure: Secondary | ICD-10-CM | POA: Insufficient documentation

## 2023-04-17 LAB — BASIC METABOLIC PANEL
Anion gap: 12 (ref 5–15)
BUN: 12 mg/dL (ref 6–20)
CO2: 26 mmol/L (ref 22–32)
Calcium: 9.1 mg/dL (ref 8.9–10.3)
Chloride: 96 mmol/L — ABNORMAL LOW (ref 98–111)
Creatinine, Ser: 1.01 mg/dL — ABNORMAL HIGH (ref 0.44–1.00)
GFR, Estimated: >60 mL/min (ref >60)
Glucose, Bld: 137 mg/dL — ABNORMAL HIGH (ref 70–99)
Potassium: 3.5 mmol/L (ref 3.5–5.1)
Sodium: 134 mmol/L — ABNORMAL LOW (ref 135–145)

## 2023-04-22 DIAGNOSIS — M79606 Pain in leg, unspecified: Secondary | ICD-10-CM | POA: Insufficient documentation

## 2023-04-25 DIAGNOSIS — M25571 Pain in right ankle and joints of right foot: Secondary | ICD-10-CM | POA: Insufficient documentation

## 2023-05-09 ENCOUNTER — Ambulatory Visit (HOSPITAL_COMMUNITY)
Admission: RE | Admit: 2023-05-09 | Discharge: 2023-05-09 | Disposition: A | Discharge status: Home / Self Care | Payer: Commercial Managed Care - PPO | Source: Ambulatory Visit | Attending: Cardiology | Admitting: Cardiology

## 2023-05-09 DIAGNOSIS — G4730 Sleep apnea, unspecified: Secondary | ICD-10-CM | POA: Insufficient documentation

## 2023-05-09 DIAGNOSIS — I5042 Chronic combined systolic (congestive) and diastolic (congestive) heart failure: Secondary | ICD-10-CM | POA: Diagnosis present

## 2023-05-09 DIAGNOSIS — I110 Hypertensive heart disease with heart failure: Secondary | ICD-10-CM | POA: Insufficient documentation

## 2023-05-09 DIAGNOSIS — I429 Cardiomyopathy, unspecified: Secondary | ICD-10-CM | POA: Insufficient documentation

## 2023-05-09 LAB — ECHOCARDIOGRAM COMPLETE
AR max vel: 2.03 cm2
AV Area VTI: 2.15 cm2
AV Area mean vel: 1.81 cm2
AV Mean grad: 5 mm[Hg]
AV Peak grad: 8.9 mm[Hg]
Ao pk vel: 1.49 m/s
Area-P 1/2: 6.48 cm2
Calc EF: 57.4 %
MV VTI: 2.88 cm2
S' Lateral: 3 cm
Single Plane A2C EF: 57.3 %
Single Plane A4C EF: 55.9 %

## 2023-05-09 NOTE — Progress Notes (Signed)
  Echocardiogram 2D Echocardiogram has been performed.  Ocie Doyne RDCS 05/09/2023, 3:52 PM

## 2023-05-15 ENCOUNTER — Telehealth (HOSPITAL_COMMUNITY): Payer: Self-pay | Admitting: *Deleted

## 2023-05-15 DIAGNOSIS — I5042 Chronic combined systolic (congestive) and diastolic (congestive) heart failure: Secondary | ICD-10-CM

## 2023-05-15 NOTE — Telephone Encounter (Signed)
 Called patient per Dr. Rolan with following echo results and recommendations:  There is some concern for a retro-cardiac mass.  This may be artifactual.  However, would recommend CT chest with contrast to rule out mass.   EF is low normal, 50-55%.  Pt verbalized understanding of same and agrees to chest CT with contrast. She denies contrast allergy . CT ordered, sent to Philicia Branch, CMA for prior authorization and scheduling. Informed patient our office will call her to schedule when prior authorization confirmed.

## 2023-06-05 ENCOUNTER — Encounter: Payer: Self-pay | Admitting: Internal Medicine

## 2023-06-05 ENCOUNTER — Ambulatory Visit: Payer: Commercial Managed Care - PPO | Admitting: Internal Medicine

## 2023-06-05 VITALS — BP 132/84 | HR 94 | Temp 98.7°F | Ht 60.0 in | Wt 211.0 lb

## 2023-06-05 DIAGNOSIS — J984 Other disorders of lung: Secondary | ICD-10-CM

## 2023-06-05 DIAGNOSIS — E118 Type 2 diabetes mellitus with unspecified complications: Secondary | ICD-10-CM | POA: Diagnosis not present

## 2023-06-05 DIAGNOSIS — M79671 Pain in right foot: Secondary | ICD-10-CM | POA: Insufficient documentation

## 2023-06-05 DIAGNOSIS — F4323 Adjustment disorder with mixed anxiety and depressed mood: Secondary | ICD-10-CM | POA: Diagnosis not present

## 2023-06-05 DIAGNOSIS — J4531 Mild persistent asthma with (acute) exacerbation: Secondary | ICD-10-CM | POA: Diagnosis not present

## 2023-06-05 MED ORDER — OZEMPIC (0.25 OR 0.5 MG/DOSE) 2 MG/3ML ~~LOC~~ SOPN: PEN_INJECTOR | SUBCUTANEOUS | 0 refills | Status: AC

## 2023-06-05 MED ORDER — LEVOTHYROXINE SODIUM 112 MCG PO TABS: 112.0000 ug | ORAL_TABLET | Freq: Every day | ORAL | 3 refills | Status: AC

## 2023-06-05 MED ORDER — SEMAGLUTIDE (2 MG/DOSE) 8 MG/3ML ~~LOC~~ SOPN: 2.0000 mg | PEN_INJECTOR | SUBCUTANEOUS | 5 refills | Status: DC

## 2023-06-05 MED ORDER — SEMAGLUTIDE (1 MG/DOSE) 4 MG/3ML ~~LOC~~ SOPN: 1.0000 mg | PEN_INJECTOR | SUBCUTANEOUS | 0 refills | Status: DC

## 2023-06-05 MED ORDER — BUPROPION HCL ER (XL) 300 MG PO TB24: 300.0000 mg | ORAL_TABLET | Freq: Every day | ORAL | 3 refills | Status: DC

## 2023-06-05 MED ORDER — DOXYCYCLINE HYCLATE 100 MG PO TABS: 100.0000 mg | ORAL_TABLET | Freq: Two times a day (BID) | ORAL | 0 refills | Status: DC

## 2023-06-05 MED ORDER — PREDNISONE 20 MG PO TABS: 40.0000 mg | ORAL_TABLET | Freq: Every day | ORAL | 0 refills | Status: DC

## 2023-06-05 NOTE — Progress Notes (Signed)
   Subjective:   Patient ID: Meredith Diaz, female    DOB: 1981/03/17, 43 y.o.   MRN: [id number]  HPI The patient is a 43 YO female coming in for concerns about breathing and wheezing. Sick for about 3 weeks. She is also having pain in right foot which is improving gradually. Started about 1 month ago and seen orthopedics and they did x-ray and treated for cellulitis which did not help and then gout with indomethacin which also did not help much. She is doing okay with health. Was down about 30 pounds with ozempic but had to stop Sept due to coverage and now is covered again and would like to resume. She is up about 20 pounds since stopping.   Review of Systems  Constitutional:  Positive for activity change and appetite change. Negative for fatigue, fever and unexpected weight change.  HENT:  Positive for congestion. Negative for ear discharge, ear pain, sinus pain, sneezing, sore throat, tinnitus, trouble swallowing and voice change.   Eyes: Negative.   Respiratory:  Positive for cough, shortness of breath and wheezing. Negative for chest tightness.   Cardiovascular: Negative.  Negative for chest pain, palpitations and leg swelling.  Gastrointestinal: Negative.  Negative for abdominal distention, abdominal pain, constipation, diarrhea, nausea and vomiting.  Musculoskeletal:  Positive for arthralgias and myalgias.  Skin: Negative.   Neurological: Negative.   Psychiatric/Behavioral: Negative.      Objective:  Physical Exam Constitutional:      Appearance: She is well-developed. She is obese.  HENT:     Head: Normocephalic and atraumatic.  Cardiovascular:     Rate and Rhythm: Normal rate and regular rhythm.  Pulmonary:     Effort: Pulmonary effort is normal. No respiratory distress.     Breath sounds: Wheezing and rhonchi present. No rales.  Abdominal:     General: Bowel sounds are normal. There is no distension.     Palpations: Abdomen is soft.     Tenderness: There is no  abdominal tenderness. There is no rebound.  Musculoskeletal:        General: Tenderness present.     Cervical back: Normal range of motion.  Skin:    General: Skin is warm and dry.  Neurological:     Mental Status: She is alert and oriented to person, place, and time.     Coordination: Coordination normal.     Vitals:   06/05/23 1024  BP: 132/84  Pulse: 94  Temp: 98.7 F (37.1 C)  TempSrc: Oral  SpO2: 99%  Weight: 211 lb (95.7 kg)  Height: 5' (1.524 m)    Assessment & Plan:  Visit time 25 minutes in face to face communication with patient and coordination of care, additional 10 minutes spent in record review, coordination or care, ordering tests, communicating/referring to other healthcare professionals, documenting in medical records all on the same day of the visit for total time 35 minutes spent on the visit.

## 2023-06-05 NOTE — Assessment & Plan Note (Signed)
I suspect more pseudogout. It sounds like she may have had tendonitis at the onset. Rx prednisone to co-treat this pseudogout and asthma. Checking uric acid in 1-2 months. If high consider allopurinol versus observation.

## 2023-06-05 NOTE — Assessment & Plan Note (Signed)
She will seek counseling as she had one she needs new. Continue wellbutrin 300 mg daily as this has helped much in the past.

## 2023-06-05 NOTE — Patient Instructions (Signed)
We have sent in prednisone to take 2 pills daily for 5 days.   We have sent in doxycycline to take 1 pill twice a day if prednisone not working in 2-3 days.

## 2023-06-05 NOTE — Assessment & Plan Note (Signed)
With flare today and given symptoms and length of time rx prednisone and doxycycline to cover for infection. She is on inhaled steroids making her more susceptible to infection.

## 2023-06-05 NOTE — Assessment & Plan Note (Signed)
She had some improvement with 30 pound weight loss and has had some recurrent SOB with 20 pound gain due to stopping ozempic.

## 2023-06-05 NOTE — Assessment & Plan Note (Signed)
She needs treatment for her sugars and weights they have increased off treatment. Last hga1c on treatment so not accurate currently. Rx ozempic standard titration to 2 mg weekly. She is also on jardiance to co-treat heart failure and diabetes.

## 2023-06-05 NOTE — Assessment & Plan Note (Signed)
Had great response to ozempic for her diabetes and heart failure. Rx done to resume.

## 2023-07-13 ENCOUNTER — Other Ambulatory Visit (HOSPITAL_COMMUNITY): Payer: Self-pay | Admitting: Family Medicine

## 2023-07-15 ENCOUNTER — Other Ambulatory Visit: Payer: Self-pay | Admitting: Pulmonary Disease

## 2023-07-15 ENCOUNTER — Telehealth: Payer: Self-pay | Admitting: Pulmonary Disease

## 2023-07-15 NOTE — Telephone Encounter (Signed)
 PT req a refill of budesonide-formoterol (BREYNA) and states it was dcln. She has been seen in the last year and Dr. Val Eagle is the prescriber. Please call PT to advise @ [phone]   [facility] [address]

## 2023-07-16 ENCOUNTER — Other Ambulatory Visit: Payer: Self-pay

## 2023-07-16 MED ORDER — BUDESONIDE-FORMOTEROL FUMARATE 160-4.5 MCG/ACT IN AERO: 2.0000 | INHALATION_SPRAY | Freq: Two times a day (BID) | RESPIRATORY_TRACT | 6 refills | Status: DC

## 2023-07-16 NOTE — Telephone Encounter (Signed)
 Spoke with patient regarding refill request . Advised patient I have approved refill request and that patient would need to make a 22month f/u with Dr.O F/u was make and patient's voice was understanding. Nothing else further needed

## 2023-07-23 ENCOUNTER — Encounter: Payer: Self-pay | Admitting: Internal Medicine

## 2023-07-24 ENCOUNTER — Other Ambulatory Visit: Payer: Self-pay | Admitting: Internal Medicine

## 2023-07-24 ENCOUNTER — Encounter: Payer: Self-pay | Admitting: Internal Medicine

## 2023-07-24 ENCOUNTER — Ambulatory Visit: Admitting: Internal Medicine

## 2023-07-24 VITALS — BP 124/80 | HR 97 | Temp 98.7°F | Ht 60.0 in | Wt 214.0 lb

## 2023-07-24 DIAGNOSIS — F4323 Adjustment disorder with mixed anxiety and depressed mood: Secondary | ICD-10-CM

## 2023-07-24 DIAGNOSIS — Z7985 Long-term (current) use of injectable non-insulin antidiabetic drugs: Secondary | ICD-10-CM

## 2023-07-24 DIAGNOSIS — I428 Other cardiomyopathies: Secondary | ICD-10-CM | POA: Diagnosis not present

## 2023-07-24 DIAGNOSIS — Z7984 Long term (current) use of oral hypoglycemic drugs: Secondary | ICD-10-CM

## 2023-07-24 DIAGNOSIS — E118 Type 2 diabetes mellitus with unspecified complications: Secondary | ICD-10-CM

## 2023-07-24 DIAGNOSIS — Z0000 Encounter for general adult medical examination without abnormal findings: Secondary | ICD-10-CM

## 2023-07-24 DIAGNOSIS — D72829 Elevated white blood cell count, unspecified: Secondary | ICD-10-CM

## 2023-07-24 LAB — MICROALBUMIN / CREATININE URINE RATIO
Creatinine,U: 134.2 mg/dL
Microalb Creat Ratio: 10 mg/g (ref 0.0–30.0)
Microalb, Ur: 1.3 mg/dL (ref 0.0–1.9)

## 2023-07-24 MED ORDER — BUPROPION HCL ER (XL) 450 MG PO TB24: 450.0000 mg | ORAL_TABLET | Freq: Every day | ORAL | 1 refills | Status: DC

## 2023-07-24 NOTE — Progress Notes (Unsigned)
   Subjective:   Patient ID: Meredith Diaz, female    DOB: October 11, 1980, 43 y.o.   MRN: [id number]  HPI The patient is here for physical.  PMH, [facility], social history reviewed and updated  Review of Systems  Objective:  Physical Exam  Vitals:   07/24/23 0954  BP: 124/80  Pulse: 97  Temp: 98.7 F (37.1 C)  TempSrc: Oral  SpO2: 98%  Weight: 214 lb (97.1 kg)  Height: 5' (1.524 m)    Assessment & Plan:

## 2023-07-26 DIAGNOSIS — Z0000 Encounter for general adult medical examination without abnormal findings: Secondary | ICD-10-CM | POA: Insufficient documentation

## 2023-07-26 NOTE — Assessment & Plan Note (Signed)
 Did best on trintellix but unafordable. We are increasing her wellbutrin from 300 mg daily to 450 mg daily to help with persistent symptoms.

## 2023-07-26 NOTE — Assessment & Plan Note (Signed)
 Overall stable and gets intermittent monitoring with [place].

## 2023-07-26 NOTE — Assessment & Plan Note (Signed)
 Overall stable but requires home oxygen which is making return to office un accomplishable. Filled out accommodation form as her job is able to be remote without difficulty.

## 2023-07-26 NOTE — Assessment & Plan Note (Signed)
 Flu shot yearly. Pneumonia counseled. Tetanus up to date. Mammogram with gyn, pap smear with gyn. Counseled about sun safety and mole surveillance. Counseled about the dangers of distracted driving. Given 10 year screening recommendations.

## 2023-07-26 NOTE — Assessment & Plan Note (Signed)
 Checking microalbumin to creatinine ratio. Adjust as needed on jardiance and ozempic.

## 2023-08-05 ENCOUNTER — Other Ambulatory Visit: Payer: Self-pay | Admitting: Obstetrics & Gynecology

## 2023-08-05 DIAGNOSIS — R928 Other abnormal and inconclusive findings on diagnostic imaging of breast: Secondary | ICD-10-CM

## 2023-08-09 ENCOUNTER — Ambulatory Visit: Payer: Self-pay

## 2023-08-09 ENCOUNTER — Other Ambulatory Visit (HOSPITAL_COMMUNITY): Payer: Self-pay | Admitting: Cardiology

## 2023-08-09 ENCOUNTER — Encounter: Payer: Self-pay | Admitting: Internal Medicine

## 2023-08-09 ENCOUNTER — Ambulatory Visit (INDEPENDENT_AMBULATORY_CARE_PROVIDER_SITE_OTHER): Admitting: Internal Medicine

## 2023-08-09 VITALS — BP 124/72 | HR 92 | Temp 98.7°F | Ht 60.0 in | Wt 211.0 lb

## 2023-08-09 DIAGNOSIS — R062 Wheezing: Secondary | ICD-10-CM | POA: Diagnosis not present

## 2023-08-09 DIAGNOSIS — E118 Type 2 diabetes mellitus with unspecified complications: Secondary | ICD-10-CM | POA: Diagnosis not present

## 2023-08-09 DIAGNOSIS — J309 Allergic rhinitis, unspecified: Secondary | ICD-10-CM | POA: Diagnosis not present

## 2023-08-09 DIAGNOSIS — I10 Essential (primary) hypertension: Secondary | ICD-10-CM

## 2023-08-09 DIAGNOSIS — J4531 Mild persistent asthma with (acute) exacerbation: Secondary | ICD-10-CM

## 2023-08-09 DIAGNOSIS — Z7984 Long term (current) use of oral hypoglycemic drugs: Secondary | ICD-10-CM

## 2023-08-09 MED ORDER — AIRSUPRA 90-80 MCG/ACT IN AERO: 2.0000 | INHALATION_SPRAY | Freq: Four times a day (QID) | RESPIRATORY_TRACT | 3 refills | Status: DC | PRN

## 2023-08-09 MED ORDER — PREDNISONE 10 MG PO TABS: ORAL_TABLET | ORAL | 0 refills | Status: DC

## 2023-08-09 MED ORDER — METHYLPREDNISOLONE ACETATE 80 MG/ML IJ SUSP
80.0000 mg | Freq: Once | INTRAMUSCULAR | Status: AC
  Administered 2023-08-09: 80 mg via INTRAMUSCULAR

## 2023-08-09 MED ORDER — MONTELUKAST SODIUM 10 MG PO TABS: 10.0000 mg | ORAL_TABLET | Freq: Every day | ORAL | 3 refills | Status: AC

## 2023-08-09 NOTE — Patient Instructions (Addendum)
 You had the steroid shot today  Please take all new medication as prescribed - the prednisone  Ok to Change the Albuterol to [place] if ok with your insurance  Please take all new medication as prescribed   - the singulair 10 mg  Please continue all other medications as before, including the symbicort  Please have the pharmacy call with any other refills you may need.  Please continue your efforts at being more active, low cholesterol diet, and weight control.  You are otherwise up to date with prevention measures today.  Please keep your appointments with your specialists as you may have planned

## 2023-08-09 NOTE — Progress Notes (Signed)
 Patient ID: Meredith Diaz, female   DOB: 02-Jun-1980, 43 y.o.   MRN: [id number]        Chief Complaint: follow up allergies and asthma worsening       HPI:  Meredith Diaz is a 43 y.o. female Does have 2 wks ongoing nasal allergy symptoms with clearish congestion, itch and sneezing, without fever, pain, ST, but also wheezing, sob doe x 3 days.   Pt denies polydipsia, polyuria, or new focal neuro s/s.    Pt denies fever, wt loss, night sweats, loss of appetite, or other constitutional symptoms   Wt Readings from Last 3 Encounters:  08/09/23 211 lb (95.7 kg)  07/24/23 214 lb (97.1 kg)  06/05/23 211 lb (95.7 kg)   BP Readings from Last 3 Encounters:  08/09/23 124/72  07/24/23 124/80  06/05/23 132/84         Past Medical History:  Diagnosis Date   Anxiety    Asthma    Back pain    Chronic combined systolic and diastolic CHF (congestive heart failure) (HCC) 07/2011   a. Prior Echo: EF was 25-30%; b. No Sig CAD on CATH; c. No significant finding on Cardiac MRI; d. follow-up Echo 2014 --  EF of 50%;  e. 03/2014 Echo: EF 40-45%, gr 1 DD; f. 11/2014 Echo: EF 40-45%, Gr 2 DD.   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   Depression    Dyspnea    Essential hypertension    Family history of breast cancer    Family history of colon cancer    Fibromyalgia    Gallbladder problem    Hypothyroidism    IBS (irritable bowel syndrome)    Morbid obesity (HCC)    Nonischemic cardiomyopathy (HCC)    a. unclear etiology;  b. 01/2012 Cardiac MRI: no infiltrative pathology, EF 52%;  b. 11/2014 Echo: EF 40-45%, gr 2 DD.   Scoliosis    Sleep apnea    Stomach problems    Thyroid disease    Vitamin B 12 deficiency    Vitamin D deficiency    Past Surgical History:  Procedure Laterality Date   CARDIAC CATHETERIZATION  08/2011   Normal  Coronaries - LVEDP 32 mmHg   Cardiac MRI  April 2013   No suggestion of a congenital abnormality; EF ~ 52% - no regional wall motion abnormalities., no increased myocardial signal intensity. No perfusion abnormality.   CHOLECYSTECTOMY  2013   RIGHT AND LEFT HEART CATH N/A 07/10/2018   Procedure: RIGHT AND LEFT HEART CATH;  Surgeon: Laurey Morale, MD;  Location: [facility] INVASIVE CV LAB;  Service: Cardiovascular;  Laterality: N/A;   TRANSTHORACIC ECHOCARDIOGRAM  07/2011   Moderate concentric LVH, mildly dilated. EF 25-30% no diastolic dysfunction parameters to suggest elevated LAP   TRANSTHORACIC ECHOCARDIOGRAM  December 2015; July 2016   a)  EF by echo 40-45% with global hypokinesis, mild LVH possible apical variant hypertrophic cardiomyopathy;; b)  EF 40-45%. Normal wall thickness (compared to prior reading of possible apical LVH), grade 2 DD -- with elevated LV filling pressures (however left atrium was read as normal size), normal artery function and size. No no suggestion of pulmonary hypertension    reports that she has never smoked. She has never used smokeless tobacco. She reports that she does not drink alcohol and does not use  drugs. family history includes Alcoholism in her mother; Asthma in her paternal grandmother; Breast cancer (age of onset: 70) in her paternal grandmother; Breast cancer (age of onset: 76) in her maternal aunt and paternal aunt; Colon cancer (age of onset: 68) in her maternal grandmother; Depression in her mother; Heart attack in her maternal grandfather, maternal grandmother, paternal grandfather, and paternal grandmother; Heart disease in her father, maternal grandfather, and paternal grandmother; Hypertension in her father. Allergies  Allergen Reactions   Lisinopril Swelling and Other (See Comments)    Angioedema facial, mostly just lips, no trouble breathing   Current Outpatient Medications on File Prior to Visit  Medication Sig Dispense Refill   albuterol (PROVENTIL)  (2.5 MG/3ML) 0.083% nebulizer solution Take 3 mLs (2.5 mg total) by nebulization every 6 (six) hours as needed for wheezing or shortness of breath. 75 mL 12   albuterol (VENTOLIN HFA) 108 (90 Base) MCG/ACT inhaler TAKE 2 PUFFS BY MOUTH EVERY 6 HOURS AS NEEDED FOR WHEEZE OR SHORTNESS OF BREATH (Patient taking differently: Inhale 2 puffs into the lungs every 6 (six) hours as needed for shortness of breath.) 8.5 each 6   budesonide-formoterol (BREYNA) 160-4.5 MCG/ACT inhaler Inhale 2 puffs into the lungs 2 (two) times daily. 1 each 6   buPROPion (WELLBUTRIN XL) 150 MG 24 hr tablet Take 3 tablets (450 mg total) by mouth daily. 270 tablet 3   cholestyramine light (PREVALITE) 4 g packet Take 4 g by mouth as needed.     clobetasol cream (TEMOVATE) 0.05 % Apply 1 application topically See admin instructions. Apply daily to affected areas of genital area as directed     doxycycline (VIBRA-TABS) 100 MG tablet Take 1 tablet (100 mg total) by mouth 2 (two) times daily. 14 tablet 0   empagliflozin (JARDIANCE) 10 MG TABS tablet Take 1 tablet (10 mg total) by mouth daily before breakfast. 30 tablet 11   levothyroxine (SYNTHROID) 112 MCG tablet Take 1 tablet (112 mcg total) by mouth daily. 90 tablet 3   metoprolol succinate (TOPROL-XL) 25 MG 24 hr tablet Take 1 tablet (25 mg total) by mouth daily. 90 tablet 3   potassium chloride SA (KLOR-CON M) 20 MEQ tablet Take 3 tablets (60 mEq total) by mouth daily. 90 tablet 5   predniSONE (DELTASONE) 20 MG tablet Take 2 tablets (40 mg total) by mouth daily with breakfast. 10 tablet 0   Semaglutide, 1 MG/DOSE, 4 MG/3ML SOPN Inject 1 mg as directed once a week. 3 mL 0   Semaglutide, 2 MG/DOSE, 8 MG/3ML SOPN Inject 2 mg as directed once a week. 3 mL 5   valsartan (DIOVAN) 80 MG tablet Take 1 tablet (80 mg total) by mouth daily. 90 tablet 3   spironolactone (ALDACTONE) 25 MG tablet Take 1 tablet (25 mg total) by mouth daily. 90 tablet 3   No current facility-administered  medications on file prior to visit.        ROS:  All others reviewed and negative.  Objective        PE:  BP 124/72 (BP Location: Right Arm, Patient Position: Sitting, Cuff Size: Normal)   Pulse 92   Temp 98.7 F (37.1 C) (Oral)   Ht 5' (1.524 m)   Wt 211 lb (95.7 kg)   SpO2 95%   BMI 41.21 kg/m                 Constitutional: Pt appears in NAD  HENT: Head: NCAT.                Right Ear: External ear normal.                 Left Ear: External ear normal.                Eyes: . Pupils are equal, round, and reactive to light. Conjunctivae and EOM are normal               Nose: without d/c or deformity               Neck: Neck supple. Gross normal ROM               Cardiovascular: Normal rate and regular rhythm.                 Pulmonary/Chest: Effort normal and breath sounds without rales or wheezing.                Abd:  Soft, NT, ND, + BS, no organomegaly               Neurological: Pt is alert. At baseline orientation, motor grossly intact               Skin: Skin is warm. No rashes, no other new lesions, LE edema - none               Psychiatric: Pt behavior is normal without agitation   Micro: none  Cardiac tracings I have personally interpreted today:  none  Pertinent Radiological findings (summarize): none   Lab Results  Component Value Date   WBC 13.8 (H) 03/26/2023   HGB 13.1 03/26/2023   HCT 39.2 03/26/2023   PLT 391 03/26/2023   GLUCOSE 137 (H) 04/17/2023   CHOL 204 (H) 03/05/2022   TRIG 142 03/05/2022   HDL 51 03/05/2022   LDLCALC 125 (H) 03/05/2022   ALT 19 03/26/2023   AST 17 03/26/2023   NA 134 (L) 04/17/2023   K 3.5 04/17/2023   CL 96 (L) 04/17/2023   CREATININE 1.01 (H) 04/17/2023   BUN 12 04/17/2023   CO2 26 04/17/2023   TSH 8.523 (H) 03/27/2023   INR 1.1 07/03/2018   HGBA1C 5.6 03/27/2023   MICROALBUR 1.3 07/24/2023   Assessment/Plan:  Erinn Mendosa is a 43 y.o. White or Caucasian [1] female with  has a past medical  history of Anxiety, Asthma, Back pain, Chronic combined systolic and diastolic CHF (congestive heart failure) (HCC) (07/2011), Congenital heart disease, adult, Depression, Dyspnea, Essential hypertension, Family history of breast cancer, Family history of colon cancer, Fibromyalgia, Gallbladder problem, Hypothyroidism, IBS (irritable bowel syndrome), Morbid obesity (HCC), Nonischemic cardiomyopathy (HCC), Scoliosis, Sleep apnea, Stomach problems, Thyroid disease, Vitamin B 12 deficiency, and Vitamin D deficiency.  Allergic rhinitis Mild to mod, for otc allegra and/or nasacort asd,,  to f/u any worsening symptoms or concerns  Asthma exacerbation Mild to mod, for depomedrol 80 mg IM, prednsone taper, change albuterol hfa to airsupra prn, continue symbicort, add singulair 10 every day, to f/u any worsening symptoms or concerns  Essential hypertension BP Readings from Last 3 Encounters:  08/09/23 124/72  07/24/23 124/80  06/05/23 132/84   Stable, pt to continue medical treatment toprol xl 25 every day, diovan 80 qd   Diabetes mellitus type 2 with complications [facility]) Lab Results  Component Value Date   HGBA1C 5.6 03/27/2023   Stable, pt to continue current medical treatment  jardiance 10 every day  Followup: Return if symptoms worsen or fail to improve.  Oliver Barre, MD 08/11/2023 8:05 PM [facility]

## 2023-08-09 NOTE — Telephone Encounter (Signed)
 Chief Complaint: worsening URI s/s, wheezing Symptoms: cough, wheezing,  Frequency: about 2 weeks Pertinent Negatives: Patient denies fever, CP Disposition: [] ED /[] Urgent Care (no appt availability in office) / [x] Appointment(In office/virtual)/ []  [place]/ [] Home Care/ [] Refused Recommended Disposition /[] [facility]/ []  Follow-up with PCP Additional Notes: Pt states that she started about 2 weeks ago with what she thought was a sinus infection. Pt states that it has worsened and has now been using her nebs, which provide minimal relief. Pt states that she feels that her chest is tight, she is coughing, and that she is feeling general malaise. Pt scheduled for today with PCP office.  Copied from CRM [id number]. Topic: Clinical - Red Word Triage >> Aug 09, 2023 10:04 AM DeAngela L wrote: Red Word that prompted transfer to Nurse Triage: Patient states she went from allergies to full blown sinus infection concern has gotten worse in last 2 weeks Reason for Disposition  [1] MILD difficulty breathing (e.g., minimal/no SOB at rest, SOB with walking, pulse <100) AND [2] still present when not coughing  Answer Assessment - Initial Assessment Questions 1. ONSET: "When did the cough begin?"      About 2 weeks, gotten worse  2. SEVERITY: "How bad is the cough today?"      Moderate-severe 3. SPUTUM: "Describe the color of your sputum" (none, dry cough; clear, white, yellow, green)     Bright green 4. HEMOPTYSIS: "Are you coughing up any blood?" If so ask: "How much?" (flecks, streaks, tablespoons, etc.)     denies 5. DIFFICULTY BREATHING: "Are you having difficulty breathing?" If Yes, ask: "How bad is it?" (e.g., mild, moderate, severe)    - MILD: No SOB at rest, mild SOB with walking, speaks normally in sentences, can lie down, no retractions, pulse < 100.    - MODERATE: SOB at rest, SOB with minimal exertion and prefers to sit, cannot lie down flat, speaks in phrases, mild  retractions, audible wheezing, pulse 100-120.    - SEVERE: Very SOB at rest, speaks in single words, struggling to breathe, sitting hunched forward, retractions, pulse > 120      Does have a wheeze, but not SOB 6. FEVER: "Do you have a fever?" If Yes, ask: "What is your temperature, how was it measured, and when did it start?"     denies 7. CARDIAC HISTORY: "Do you have any history of heart disease?" (e.g., heart attack, congestive heart failure)      CHF 8. LUNG HISTORY: "Do you have any history of lung disease?"  (e.g., pulmonary embolus, asthma, emphysema)   asthma 9. PE RISK FACTORS: "Do you have a history of blood clots?" (or: recent major surgery, recent prolonged travel, bedridden)     denies 10. OTHER SYMPTOMS: "Do you have any other symptoms?" (e.g., runny nose, wheezing, chest pain)       Wheezing, chest tightness 11. PREGNANCY: "Is there any chance you are pregnant?" "When was your last menstrual period?"       denies 12. TRAVEL: "Have you traveled out of the country in the last month?" (e.g., travel history, exposures)       denies  Protocols used: Cough - Acute Productive-A-AH

## 2023-08-11 ENCOUNTER — Encounter: Payer: Self-pay | Admitting: Internal Medicine

## 2023-08-11 DIAGNOSIS — J45901 Unspecified asthma with (acute) exacerbation: Secondary | ICD-10-CM | POA: Insufficient documentation

## 2023-08-11 DIAGNOSIS — J309 Allergic rhinitis, unspecified: Secondary | ICD-10-CM | POA: Insufficient documentation

## 2023-08-11 MED ORDER — TORSEMIDE 20 MG PO TABS: ORAL_TABLET | ORAL | Status: DC

## 2023-08-11 NOTE — Assessment & Plan Note (Signed)
 Mild to mod, for depomedrol 80 mg IM, prednsone taper, change albuterol hfa to airsupra prn, continue symbicort, add singulair 10 every day, to f/u any worsening symptoms or concerns

## 2023-08-11 NOTE — Assessment & Plan Note (Signed)
 Lab Results  Component Value Date   HGBA1C 5.6 03/27/2023   Stable, pt to continue current medical treatment jardiance 10 every day

## 2023-08-11 NOTE — Assessment & Plan Note (Signed)
Mild to mod, for otc allegra and/or nasacort asd,,  to f/u any worsening symptoms or concerns

## 2023-08-11 NOTE — Assessment & Plan Note (Signed)
 BP Readings from Last 3 Encounters:  08/09/23 124/72  07/24/23 124/80  06/05/23 132/84   Stable, pt to continue medical treatment toprol xl 25 every day, diovan 80 qd

## 2023-08-20 ENCOUNTER — Ambulatory Visit
Admission: RE | Admit: 2023-08-20 | Discharge: 2023-08-20 | Disposition: A | Discharge status: Home / Self Care | Source: Ambulatory Visit | Attending: Obstetrics & Gynecology | Admitting: Obstetrics & Gynecology

## 2023-08-20 ENCOUNTER — Other Ambulatory Visit: Payer: Self-pay | Admitting: Obstetrics & Gynecology

## 2023-08-20 DIAGNOSIS — R928 Other abnormal and inconclusive findings on diagnostic imaging of breast: Secondary | ICD-10-CM

## 2023-08-20 DIAGNOSIS — N6489 Other specified disorders of breast: Secondary | ICD-10-CM

## 2023-08-21 ENCOUNTER — Ambulatory Visit
Admission: RE | Admit: 2023-08-21 | Discharge: 2023-08-21 | Disposition: A | Discharge status: Home / Self Care | Source: Ambulatory Visit | Attending: Obstetrics & Gynecology | Admitting: Obstetrics & Gynecology

## 2023-08-21 DIAGNOSIS — N6489 Other specified disorders of breast: Secondary | ICD-10-CM

## 2023-08-21 HISTORY — PX: BREAST BIOPSY: SHX20

## 2023-08-22 LAB — SURGICAL PATHOLOGY

## 2023-09-09 ENCOUNTER — Encounter: Payer: Self-pay | Admitting: Pulmonary Disease

## 2023-09-09 ENCOUNTER — Ambulatory Visit: Admitting: Pulmonary Disease

## 2023-09-09 VITALS — BP 106/76 | HR 96 | Temp 98.4°F | Ht 60.0 in | Wt 213.0 lb

## 2023-09-09 DIAGNOSIS — J984 Other disorders of lung: Secondary | ICD-10-CM | POA: Diagnosis not present

## 2023-09-09 DIAGNOSIS — G4733 Obstructive sleep apnea (adult) (pediatric): Secondary | ICD-10-CM | POA: Diagnosis not present

## 2023-09-09 DIAGNOSIS — I429 Cardiomyopathy, unspecified: Secondary | ICD-10-CM

## 2023-09-09 MED ORDER — AIRSUPRA 90-80 MCG/ACT IN AERO: 2.0000 | INHALATION_SPRAY | Freq: Four times a day (QID) | RESPIRATORY_TRACT | 3 refills | Status: DC | PRN

## 2023-09-09 NOTE — Progress Notes (Signed)
 Subjective:    Patient ID: Meredith Diaz, female    DOB: March 23, 1981, 43 y.o.   MRN: [id number]  Patient with a history of mild obstructive obstructive sleep apnea  Has been doing relatively well Did have some problems with allergies recently Occasional wheezing  Recently had follow-up with her primary  Has been trying to stay active  She still does get short of breath with most activities but it is a bit better  She does use oxygen  supplementation as needed  Compliant with Symbicort  Recently prescribed Airsupra  - Has not noticed significant change with using Airsupra   Continues to work on weight loss  History of systolic and diastolic heart failure, hypothyroidism, chronic fatigue, kyphoscoliosis - Overall has been stable  Sleep apnea diagnosed in 2016  Restrictive physiology from past PFTs    Past Medical History:  Diagnosis Date   Anxiety    Asthma    Back pain    Chronic combined systolic and diastolic CHF (congestive heart failure) (HCC) 07/2011   a. Prior Echo: EF was 25-30%; b. No Sig CAD on CATH; c. No significant finding on Cardiac MRI; d. follow-up Echo 2014 --  EF of 50%;  e. 03/2014 Echo: EF 40-45%, gr 1 DD; f. 11/2014 Echo: EF 40-45%, Gr 2 DD.   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   Depression    Dyspnea    Essential hypertension    Family history of breast cancer    Family history of colon cancer    Fibromyalgia    Gallbladder problem    Hypothyroidism    IBS (irritable bowel syndrome)    Morbid obesity (HCC)    Nonischemic cardiomyopathy (HCC)    a. unclear etiology;  b. 01/2012 Cardiac MRI: no infiltrative pathology, EF 52%;  b. 11/2014 Echo: EF 40-45%, gr 2 DD.   Scoliosis    Sleep apnea    Stomach problems    Thyroid  disease    Vitamin B 12 deficiency    Vitamin D  deficiency     Social History   Socioeconomic History   Marital status: Married    Spouse name: Venice Gillis   Number of children: 0   Years of education: Not on file   Highest education level: Master's degree (e.g., MA, MS, MEng, MEd, MSW, MBA)  Occupational History   Occupation: Programmer, multimedia  Tobacco Use   Smoking status: Never   Smokeless tobacco: Never  Vaping Use   Vaping status: Never Used  Substance and Sexual Activity   Alcohol use: No   Drug use: No   Sexual activity: Not on file  Other Topics Concern   Not on file  Social History Narrative   Not on file   Social Drivers of Health   Financial Resource Strain: Low Risk  (07/23/2023)   Overall Financial Resource Strain (CARDIA)    Difficulty of Paying Living Expenses: Not very hard  Food Insecurity: No Food Insecurity (07/23/2023)   Hunger Vital Sign    Worried About Running Out of Food in the Last Year: Never true    Ran Out of Food in the Last Year: Never true  Transportation Needs: No Transportation Needs (07/23/2023)   PRAPARE - Administrator, Civil Service (Medical): No    Lack of Transportation (Non-Medical): No  Physical Activity: Insufficiently Active (07/23/2023)  Exercise Vital Sign    Days of Exercise per Week: 2 days    Minutes of Exercise per Session: 10 min  Stress: Stress Concern Present (07/23/2023)   [employer] of Occupational Health - Occupational Stress Questionnaire    Feeling of Stress : Rather much  Social Connections: Unknown (07/23/2023)   Social Connection and Isolation Panel [NHANES]    Frequency of Communication with Friends and Family: Twice a week    Frequency of Social Gatherings with Friends and Family: Not on file    Attends Religious Services: Never    Database administrator or Organizations: No    Attends Engineer, structural: Not on file    Marital Status: Married  Catering manager Violence: Not At Risk (03/27/2023)   Humiliation, Afraid, Rape, and Kick  questionnaire    Fear of Current or Ex-Partner: No    Emotionally Abused: No    Physically Abused: No    Sexually Abused: No   Family History  Problem Relation Age of Onset   Depression Mother    Alcoholism Mother    Hypertension Father    Heart disease Father    Colon cancer Maternal Grandmother 62   Heart attack Maternal Grandmother    Heart disease Maternal Grandfather    Heart attack Maternal Grandfather    Heart disease Paternal Grandmother    Breast cancer Paternal Grandmother 36       possible bilateral cancer   Asthma Paternal Grandmother    Heart attack Paternal Grandmother    Heart attack Paternal Grandfather    Breast cancer Maternal Aunt 50   Breast cancer Paternal Aunt 50   Stroke Neg Hx    Thyroid  disease Neg Hx        Review of Systems  Constitutional:  Negative for fatigue and fever.  Respiratory:  Positive for apnea and shortness of breath. Negative for cough.   Genitourinary:  Negative for dysuria.  Skin:  Negative for rash.  Allergic/Immunologic: Negative.   Psychiatric/Behavioral:  Positive for sleep disturbance. Negative for dysphoric mood.        Objective:   Physical Exam Constitutional:      General: She is not in acute distress.    Appearance: She is obese.  HENT:     Head: Normocephalic and atraumatic.     Mouth/Throat:     Mouth: Mucous membranes are moist.  Eyes:     General: No scleral icterus.    Pupils: Pupils are equal, round, and reactive to light.  Cardiovascular:     Rate and Rhythm: Normal rate and regular rhythm.     Heart sounds: No murmur heard.    No friction rub.  Pulmonary:     Effort: Pulmonary effort is normal. No respiratory distress.     Breath sounds: Normal breath sounds. No stridor. No wheezing or rhonchi.  Musculoskeletal:     Cervical back: No rigidity or tenderness.  Neurological:     Mental Status: She is alert.  Psychiatric:        Mood and Affect: Mood normal.        Behavior: Behavior normal.     Vitals:   09/09/23 0848  BP: 106/76  Pulse: 96  Temp: 98.4 F (36.9 C)  SpO2: 94%    CT scan of the chest reviewed-10/01/2018-reviewed by myself with the patient, previous CT [id number] also reviewed, 10/10/2016 reviewed  Last CT scan of the chest 05/01/2022 with basilar atelectasis-reviewed by myself  Echocardiogram 05/09/2023 -  Ejection fraction low normal 50-55%  Pulmonary function study from 08/25/2014 reviewed showing severe restriction PFT from 04/28/2019 with severe restrictive disease   CPAP compliance reviewed 98% compliance Average use of 7 hours 39 minutes AutoSet 5-18 Residual AHI of 0.7    Assessment & Plan:  .  Obstructive sleep apnea - Tolerating CPAP well - Feels she is sleeping better and no significant issues with CPAP use  .  Obesity hypoventilation - Continues to work on weight loss - Appears to be doing well overall  .  Systolic and diastolic heart failure .  Cardiomyopathy - Follows up with cardiology  .  Severe restrictive lung disease - Relating to body habitus Kyphoscoliosis - Weight loss is helping  Continue current lines of care  Prescription for Airsupra  was provided  Follow-up in 6 months  Call us  with significant concerns

## 2023-09-09 NOTE — Patient Instructions (Addendum)
 Continue using your CPAP nightly Download from your machine shows it is working well  Continue with the Airsupra -I did send in refills for it If it is too expensive, go back to using albuterol   Continue your Symbicort   Call us  with significant concerns  I will see you back in about 6 months  Continue oxygen  supplementation as needed

## 2023-09-24 ENCOUNTER — Telehealth (HOSPITAL_COMMUNITY): Payer: Self-pay

## 2023-09-24 NOTE — Progress Notes (Signed)
 Date:  09/24/2023   ID:  Meredith Diaz, DOB 1980-12-11, MRN [id number]  Provider location: [facility] Type of Visit: Established patient   PCP:  Adelia Homestead, MD  Cardiologist:  Dr. Mitzie Anda   HPI: Meredith Diaz is a 43 y.o. female who has a history of nonischemic cardiomyopathy.  This was diagnosed in 2013.  EF was initially 25-30%. Cardiac cath at that time showed no significant CAD.  Cardiac MRI showed no late gadoliniuim enhancement.  Since that time, EF has remained low but has improved a bit.  Most recent echo in 11/17 showed EF 45-50% with diffuse hypokinesis.  She also has asthma.  PFTs showed a severe restrictive defect that may be due to body habitus primarily.    She was admitted in 8/16 with chest pain.  CTA chest showed no PE, lung parenchyma looked normal.  She had ETT with poor exercise tolerance but no evidence for ischemia.  CPX was done in 12/16, showing moderate functional limitation due primarily to lung restriction.    Admitted 11/4 - 03/13/18 for increased DOE. Diuresed well with IV lasix  and treated for asthma exacerbation. Treated with solumedrol and prednisone  burst. Echo with EF 50-55%, mildly dilated/dysfunctional RV, septal bounce noted but IVC normal in size.    Due to increased dyspnea, RHC was done in 3/20.  Right and left heart filling pressures were elevated with near equalization of pressures suggesting restrictive cardiomyopathy.  No evidence of pericardial constriction.  I increased her torsemide  to 40 mg bid.   She has seen pulmonary.  No evidence for interstitial lung disease.  Autoimmune workup was negative.  She completed pulmonary rehab.   Echo in 10/23 showed EF 50-55%, normal RV, normal IVC.   Echo 1/25: EF 50-55%, RV mildly reduced  Today she returns for HF follow up. Overall feeling fine. She is SOB walking up steps, breathing worse when she is outside in the pollen. Feels a little "off" recently, she  is unsure the cause. Rings are tight around her fingers. Denies palpitations, abnormal bleeding, CP, dizziness, or PND/Orthopnea. Appetite ok. Weight at home 213 pounds. Taking all medications. Back on GLP1. Previously lost 30 lbs on semaglutide , frustrated as weight loss seems to have stalled.  Wears 2L oxygen  at night with CPAP.   ECG (personally reviewed): ST 102 bpm   Labs (9/20): K 3.8, creatinine 0.85, Rf negative, CCP negative, ANA negative, dsDNA negative, SCL-70 negative Labs (11/24): K 3.5, creatinine 1.09 Labs (12/24): K 3.5, creatinine 1.01   PMH: 1. Chronic systolic CHF: Nonischemic cardiomyopathy.  - EF 25-30% by echo in 2013.  - Cardiac MRI (2013) with EF 52%, no delayed enhancement.  - LHC in 2013 without significant CAD. - 11/15 echo with EF 40-45%.   - 7/16 echo with EF 40-45%, grade II diastolic dysfunction.  - ETT (6/57) with poor exercise tolerance but no ischemia.  - CPX (12/16): peak VO2 15.7 (23.5 adjusted for ideal body weight), VE/VCO2 slope 21, RER 1.15 => moderate functional limitation thought to be primarily due to restrictive lung physiology.  - Echo (11/17): EF 45-50%, mild diffuse hypokinesis, normal RV size and systolic function.  - Echo (11/19): EF 50-55%, mildly dilated and mildly dysfunctional RV with respirophasic septal bounce noted, normal IVC and PA pressure estimate.  - RHC (3/20): mean 19, PA 54/18 mean 38, mean PCWP 23, CI 2.77, PVR 2.79 WU. Equalization of pressures suggestive of restrictive cardiomyopathy, not suggestive of pericardial constriction.  -  Echo (10/23): EF 50-55%, normal RV, normal IVC. - Echo (1/25): EF 50-55%, RV mildly reduced. 2. Asthma: PFTs (4/16) with mild obstruction, severe restriction and severely decreased DLCO => think primarily due to body habitus.  3. Depression 4. Hypothyroidism 5. 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  6. Angioedema with ACEI 7. OHS/OSA: CPAP at night, oxygen  during the day.  8.  Chronic leukocytosis: Negative workup.  9. BPPV 10. Obesity  11. Scoliosis: CT chest (5/20) showed no ILD, severe scoliosis.   Current Outpatient Medications  Medication Sig Dispense Refill   albuterol  (PROVENTIL ) (2.5 MG/3ML) 0.083% nebulizer solution Take 3 mLs (2.5 mg total) by nebulization every 6 (six) hours as needed for wheezing or shortness of breath. 75 mL 12   albuterol  (VENTOLIN  HFA) 108 (90 Base) MCG/ACT inhaler TAKE 2 PUFFS BY MOUTH EVERY 6 HOURS AS NEEDED FOR WHEEZE OR SHORTNESS OF BREATH (Patient taking differently: Inhale 2 puffs into the lungs every 6 (six) hours as needed for shortness of breath.) 8.5 each 6   Albuterol -Budesonide  (AIRSUPRA ) 90-80 MCG/ACT AERO Inhale 2 Inhalations into the lungs 4 (four) times daily as needed. 17.7 g 3   budesonide -formoterol  (BREYNA ) 160-4.5 MCG/ACT inhaler Inhale 2 puffs into the lungs 2 (two) times daily. 1 each 6   buPROPion  (WELLBUTRIN  XL) 150 MG 24 hr tablet Take 3 tablets (450 mg total) by mouth daily. 270 tablet 3   cholestyramine  light (PREVALITE ) 4 g packet Take 4 g by mouth as needed.     clobetasol  cream (TEMOVATE ) 0.05 % Apply 1 application topically See admin instructions. Apply daily to affected areas of genital area as directed     empagliflozin  (JARDIANCE ) 10 MG TABS tablet Take 1 tablet (10 mg total) by mouth daily before breakfast. 30 tablet 11   levothyroxine  (SYNTHROID ) 112 MCG tablet Take 1 tablet (112 mcg total) by mouth daily. 90 tablet 3   metoprolol  succinate (TOPROL -XL) 25 MG 24 hr tablet Take 1 tablet (25 mg total) by mouth daily. 90 tablet 3   montelukast  (SINGULAIR ) 10 MG tablet Take 1 tablet (10 mg total) by mouth at bedtime. 90 tablet 3   potassium chloride  SA (KLOR-CON  M) 20 MEQ tablet Take 3 tablets (60 mEq total) by mouth daily. 90 tablet 5   Semaglutide , 1 MG/DOSE, 4 MG/3ML SOPN Inject 1 mg as directed once a week. 3 mL 0   Semaglutide , 2 MG/DOSE, 8 MG/3ML SOPN Inject 2 mg as directed once a week. 3 mL 5    spironolactone  (ALDACTONE ) 25 MG tablet Take 1 tablet (25 mg total) by mouth daily. 90 tablet 3   torsemide  (DEMADEX ) 20 MG tablet Take 4 tablets in the AM by mouth, and 3 tablets in the evening     valsartan  (DIOVAN ) 80 MG tablet Take 1 tablet (80 mg total) by mouth daily. 90 tablet 3   No current facility-administered medications for this visit.    Allergies:   Lisinopril   Social History:  The patient  reports that she has never smoked. She has never used smokeless tobacco. She reports that she does not drink alcohol and does not use drugs.   Family History:  The patient's family history includes Alcoholism in her mother; Asthma in her paternal grandmother; Breast cancer (age of onset: 90) in her paternal grandmother; Breast cancer (age of onset: 36) in her maternal aunt and paternal aunt; Colon cancer (age of onset: 64) in her maternal grandmother; Depression in her mother; Heart attack in her maternal grandfather, maternal  grandmother, paternal grandfather, and paternal grandmother; Heart disease in her father, maternal grandfather, and paternal grandmother; Hypertension in her father.   ROS:  Please see the history of present illness.   All other systems are personally reviewed and negative.   Wt Readings from Last 3 Encounters:  09/25/23 96.6 kg (213 lb)  09/09/23 96.6 kg (213 lb)  08/09/23 95.7 kg (211 lb)   BP 100/78   Pulse (!) 103   Wt 96.6 kg (213 lb)   SpO2 97%   BMI 41.60 kg/m   Exam:   General:  NAD. No resp difficulty, walked into clinic HEENT: Normal Neck: Supple. No JVD. Thick neck Cor: Regular rate & rhythm. No rubs, gallops or murmurs. Lungs: Clear Abdomen: Soft, obese, nontender, nondistended.  Extremities: No cyanosis, clubbing, rash, edema Neuro: Alert & oriented x 3, moves all 4 extremities w/o difficulty. Affect pleasant.   ASSESSMENT AND PLAN:  1. Chronic systolic CHF: Nonischemic cardiomyopathy.  No CAD on prior cath, prior cMRI showed no late  gadolinium enhancement.  No family history of cardiomyopathy.  No ETOH/substance abuse.  No uncontrolled HTN.  Possible that she had viral myocarditis. Echo in 03/11/18 showed LVEF up to 50-55% but with mildly dilated/dysfunctional RV, IVC appeared normal and PA pressure estimation was normal, septal bounce was noted.  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  No ventricular interdependence to suggest pericardial constriction. Echo in 10/23 showed EF 50-55%, normal RV, normal IVC. Echo 1/25 EF 50-55%, RV mildly reduced. NYHA class II, not significantly volume overloaded on exam. I think that a lot of her symptomatology is due to restriction from excess weight => OHS/OSA, this has improved with weight loss.  - Continue torsemide  80 mg daily, BMET/BNP today.  - Continue Jardiance  10 mg daily. No GU symptoms. - Continue Toprol  XL 25 mg daily.  - Continue spironolactone  25 mg daily.   - Continue valsartan  80 mg daily.   - Check TSH and iron panel today. 2. Asthma: No wheezing on exam.  3. OHS/OSA: She uses CPAP at night and oxygen  during the day.  OHS is likely due to lung restriction from obesity and scoliosis, CT chest in 5/20 showed no ILD but did show severe scoliosis.  I think OHS/OSA plays a large role in her dyspnea.  Symptoms and oxygen  requirement improving with weight loss.   4. Obesity: Body mass index is 41.6 kg/m. Weight loss is imperative.  She tried the [facility] but did not find it helpful.  Not interested in bariatric surgery right now. She was able to take semaglutide  for a while, then insurance changed and does not cover.   - Now back on semaglutide , hopefully weight loss will pick up. ? Switch to tirzepatide.  Followup in 6 months with Dr. Mitzie Anda  Signed, Elmarie Hacking, FNP  09/24/2023  [facility] [address] [facility] [place] [address] [phone] (office) [phone] (fax)

## 2023-09-24 NOTE — Telephone Encounter (Signed)
 Called to confirm/remind patient of their appointment at the [facility] on 09/25/23.   Appointment:   [x] Confirmed  [] Left mess   [] No answer/No voice mail  [] VM Full/unable to leave message  [] Phone not in service  Patient reminded to bring all medications and/or complete list.  Confirmed patient has transportation. Gave directions, instructed to utilize valet parking.

## 2023-09-25 ENCOUNTER — Ambulatory Visit (HOSPITAL_COMMUNITY): Payer: Self-pay | Admitting: Family Medicine

## 2023-09-25 ENCOUNTER — Encounter (HOSPITAL_COMMUNITY): Payer: Self-pay

## 2023-09-25 ENCOUNTER — Ambulatory Visit (HOSPITAL_COMMUNITY)
Admission: RE | Admit: 2023-09-25 | Discharge: 2023-09-25 | Disposition: A | Discharge status: Home / Self Care | Payer: Commercial Managed Care - PPO | Source: Ambulatory Visit | Attending: Family Medicine | Admitting: Family Medicine

## 2023-09-25 VITALS — BP 100/78 | HR 103 | Wt 213.0 lb

## 2023-09-25 DIAGNOSIS — Z9981 Dependence on supplemental oxygen: Secondary | ICD-10-CM | POA: Insufficient documentation

## 2023-09-25 DIAGNOSIS — G4733 Obstructive sleep apnea (adult) (pediatric): Secondary | ICD-10-CM | POA: Diagnosis not present

## 2023-09-25 DIAGNOSIS — Z79899 Other long term (current) drug therapy: Secondary | ICD-10-CM | POA: Insufficient documentation

## 2023-09-25 DIAGNOSIS — I428 Other cardiomyopathies: Secondary | ICD-10-CM | POA: Insufficient documentation

## 2023-09-25 DIAGNOSIS — J45909 Unspecified asthma, uncomplicated: Secondary | ICD-10-CM | POA: Diagnosis not present

## 2023-09-25 DIAGNOSIS — E669 Obesity, unspecified: Secondary | ICD-10-CM | POA: Diagnosis not present

## 2023-09-25 DIAGNOSIS — I5022 Chronic systolic (congestive) heart failure: Secondary | ICD-10-CM | POA: Diagnosis present

## 2023-09-25 DIAGNOSIS — Z7984 Long term (current) use of oral hypoglycemic drugs: Secondary | ICD-10-CM | POA: Insufficient documentation

## 2023-09-25 DIAGNOSIS — Z6841 Body Mass Index (BMI) 40.0 and over, adult: Secondary | ICD-10-CM | POA: Diagnosis not present

## 2023-09-25 LAB — CBC
HCT: 41.4 % (ref 36.0–46.0)
Hemoglobin: 13.9 g/dL (ref 12.0–15.0)
MCH: 30.1 pg (ref 26.0–34.0)
MCHC: 33.6 g/dL (ref 30.0–36.0)
MCV: 89.6 fL (ref 80.0–100.0)
Platelets: 427 10*3/uL — ABNORMAL HIGH (ref 150–400)
RBC: 4.62 MIL/uL (ref 3.87–5.11)
RDW: 14.4 % (ref 11.5–15.5)
WBC: 19.5 10*3/uL — ABNORMAL HIGH (ref 4.0–10.5)
nRBC: 0 % (ref 0.0–0.2)

## 2023-09-25 LAB — BASIC METABOLIC PANEL WITH GFR
Anion gap: 15 (ref 5–15)
BUN: 20 mg/dL (ref 6–20)
CO2: 29 mmol/L (ref 22–32)
Calcium: 9.2 mg/dL (ref 8.9–10.3)
Chloride: 93 mmol/L — ABNORMAL LOW (ref 98–111)
Creatinine, Ser: 1.21 mg/dL — ABNORMAL HIGH (ref 0.44–1.00)
GFR, Estimated: 57 mL/min — ABNORMAL LOW (ref >60)
Glucose, Bld: 152 mg/dL — ABNORMAL HIGH (ref 70–99)
Potassium: 3 mmol/L — ABNORMAL LOW (ref 3.5–5.1)
Sodium: 137 mmol/L (ref 135–145)

## 2023-09-25 LAB — IRON AND TIBC
Iron: 58 ug/dL (ref 28–170)
Saturation Ratios: 14 % (ref 10.4–31.8)
TIBC: 421 ug/dL (ref 250–450)
UIBC: 363 ug/dL

## 2023-09-25 LAB — FERRITIN: Ferritin: 112 ng/mL (ref 11–307)

## 2023-09-25 LAB — BRAIN NATRIURETIC PEPTIDE: B Natriuretic Peptide: 16.3 pg/mL (ref 0.0–100.0)

## 2023-09-25 LAB — TSH: TSH: 2.706 u[IU]/mL (ref 0.350–4.500)

## 2023-09-25 MED ORDER — METOPROLOL SUCCINATE ER 25 MG PO TB24: 25.0000 mg | ORAL_TABLET | Freq: Every day | ORAL | 3 refills | Status: AC

## 2023-09-25 MED ORDER — EMPAGLIFLOZIN 10 MG PO TABS: 10.0000 mg | ORAL_TABLET | Freq: Every day | ORAL | 11 refills | Status: DC

## 2023-09-25 MED ORDER — SPIRONOLACTONE 25 MG PO TABS: 25.0000 mg | ORAL_TABLET | Freq: Every day | ORAL | 3 refills | Status: AC

## 2023-09-25 MED ORDER — TORSEMIDE 20 MG PO TABS: ORAL_TABLET | ORAL | Status: DC

## 2023-09-25 MED ORDER — POTASSIUM CHLORIDE CRYS ER 20 MEQ PO TBCR: 60.0000 meq | EXTENDED_RELEASE_TABLET | Freq: Every day | ORAL | 5 refills | Status: DC

## 2023-09-25 MED ORDER — VALSARTAN 80 MG PO TABS: 80.0000 mg | ORAL_TABLET | Freq: Every day | ORAL | 3 refills | Status: DC

## 2023-09-25 NOTE — Patient Instructions (Addendum)
 Good to see you today!  No Medication changes  Labs done today, your results will be available in MyChart, we will contact you for abnormal readings.  Your physician recommends that you schedule a follow-up appointment :6 months(November) call office in September to schedule an appointment  If you have any questions or concerns before your next appointment please send us  a message through [place] or call our office at [phone].    TO LEAVE A MESSAGE FOR THE NURSE SELECT OPTION 2, PLEASE LEAVE A MESSAGE INCLUDING: YOUR NAME DATE OF BIRTH CALL BACK NUMBER REASON FOR CALL**this is important as we prioritize the call backs  YOU WILL RECEIVE A CALL BACK THE SAME DAY AS LONG AS YOU CALL BEFORE 4:00 PM At the [facility], you and your health needs are our priority. As part of our continuing mission to provide you with exceptional heart care, we have created designated Provider Care Teams. These Care Teams include your primary Cardiologist (physician) and Advanced Practice Providers (APPs- Physician Assistants and Nurse Practitioners) who all work together to provide you with the care you need, when you need it.   You may see any of the following providers on your designated Care Team at your next follow up: Dr Jules Oar Dr Peder Bourdon Dr. Alwin Baars Dr. Arta Lark Amy Marijane Shoulders, NP Ruddy Corral, [place] [facility], [place] Dennise Fitz, NP [place] Lee, NP Shawnee Dellen, NP Luster Salters, PharmD Bevely Brush, PharmD   Please be sure to bring in all your medications bottles to every appointment.    Thank you for choosing  [facility]

## 2023-09-26 NOTE — Telephone Encounter (Addendum)
 Pt aware, agreeable, and verbalized understanding  Labs and X-ray ordered med list updated   ----- Message from Elmarie Hacking sent at 09/25/2023  4:55 PM EDT ----- K is low and WBC elevated, concerning for current infection.  Please increase KCL to 60 AM and 40 PM, repeat BMET in 1 week. I will forward her labs to her PCP, (? UTI or PNA). Please arrange for a CXR to ensure she does not have PNA, she said she was more short of breath

## 2023-10-03 ENCOUNTER — Ambulatory Visit (HOSPITAL_COMMUNITY)
Admission: RE | Admit: 2023-10-03 | Discharge: 2023-10-03 | Disposition: A | Discharge status: Home / Self Care | Source: Ambulatory Visit | Attending: Internal Medicine | Admitting: Internal Medicine

## 2023-10-03 ENCOUNTER — Ambulatory Visit (HOSPITAL_COMMUNITY)
Admission: RE | Admit: 2023-10-03 | Discharge: 2023-10-03 | Disposition: A | Discharge status: Home / Self Care | Source: Ambulatory Visit | Attending: Family Medicine | Admitting: Family Medicine

## 2023-10-03 ENCOUNTER — Ambulatory Visit (HOSPITAL_COMMUNITY): Payer: Self-pay | Admitting: Family Medicine

## 2023-10-03 DIAGNOSIS — R0602 Shortness of breath: Secondary | ICD-10-CM | POA: Diagnosis not present

## 2023-10-03 DIAGNOSIS — I5022 Chronic systolic (congestive) heart failure: Secondary | ICD-10-CM | POA: Insufficient documentation

## 2023-10-03 LAB — BASIC METABOLIC PANEL WITH GFR
Anion gap: 9 (ref 5–15)
BUN: 22 mg/dL — ABNORMAL HIGH (ref 6–20)
CO2: 28 mmol/L (ref 22–32)
Calcium: 9.1 mg/dL (ref 8.9–10.3)
Chloride: 99 mmol/L (ref 98–111)
Creatinine, Ser: 1.03 mg/dL — ABNORMAL HIGH (ref 0.44–1.00)
GFR, Estimated: >60 mL/min (ref >60)
Glucose, Bld: 128 mg/dL — ABNORMAL HIGH (ref 70–99)
Potassium: 4 mmol/L (ref 3.5–5.1)
Sodium: 136 mmol/L (ref 135–145)

## 2023-11-26 ENCOUNTER — Other Ambulatory Visit: Payer: Self-pay | Admitting: Pulmonary Disease

## 2023-11-28 ENCOUNTER — Other Ambulatory Visit: Payer: Self-pay | Admitting: Pulmonary Disease

## 2023-11-28 ENCOUNTER — Encounter: Payer: Self-pay | Admitting: Pulmonary Disease

## 2023-11-28 MED ORDER — ALBUTEROL SULFATE HFA 108 (90 BASE) MCG/ACT IN AERS: 2.0000 | INHALATION_SPRAY | RESPIRATORY_TRACT | 6 refills | Status: DC | PRN

## 2023-11-28 NOTE — Telephone Encounter (Signed)
**Note De-identified  [place] Obfuscation** Please advise 

## 2023-12-17 ENCOUNTER — Other Ambulatory Visit (HOSPITAL_COMMUNITY): Payer: Self-pay | Admitting: Family Medicine

## 2024-02-17 ENCOUNTER — Encounter: Payer: Self-pay | Admitting: Pulmonary Disease

## 2024-02-17 ENCOUNTER — Ambulatory Visit: Admitting: Pulmonary Disease

## 2024-02-17 VITALS — BP 84/60 | HR 89 | Ht 60.0 in | Wt 215.6 lb

## 2024-02-17 DIAGNOSIS — G4733 Obstructive sleep apnea (adult) (pediatric): Secondary | ICD-10-CM

## 2024-02-17 DIAGNOSIS — J9610 Chronic respiratory failure, unspecified whether with hypoxia or hypercapnia: Secondary | ICD-10-CM | POA: Diagnosis not present

## 2024-02-17 DIAGNOSIS — I429 Cardiomyopathy, unspecified: Secondary | ICD-10-CM

## 2024-02-17 DIAGNOSIS — J984 Other disorders of lung: Secondary | ICD-10-CM | POA: Diagnosis not present

## 2024-02-17 DIAGNOSIS — I5042 Chronic combined systolic (congestive) and diastolic (congestive) heart failure: Secondary | ICD-10-CM

## 2024-02-17 MED ORDER — ALBUTEROL SULFATE HFA 108 (90 BASE) MCG/ACT IN AERS: 2.0000 | INHALATION_SPRAY | RESPIRATORY_TRACT | 6 refills | Status: AC | PRN

## 2024-02-17 MED ORDER — BUDESONIDE-FORMOTEROL FUMARATE 160-4.5 MCG/ACT IN AERO: 2.0000 | INHALATION_SPRAY | Freq: Two times a day (BID) | RESPIRATORY_TRACT | 6 refills | Status: AC

## 2024-02-17 NOTE — Addendum Note (Signed)
 Addended by: CLAIR SEVERA MATSU on: 02/17/2024 09:34 AM   Modules accepted: Orders

## 2024-02-17 NOTE — Progress Notes (Signed)
 Subjective:    Patient ID: Meredith Diaz, female    DOB: February 29, 1981, 43 y.o.   MRN: [id number]  Patient with a history of mild obstructive obstructive sleep apnea  Has been doing relatively well Continues to do relatively well  Uses CPAP on a nightly basis Uses oxygen  supplementation with activity  Has been trying to stay very active Exercising regularly she still does get short of breath with activity but she is trying to do more   Compliant with inhalers  Weight has been relatively stable  Wakes up feeling fairly rested, does not like using CPAP but tries to use it on a regular basis   She does have a history of diastolic heart failure, hypothyroidism, chronic fatigue, kyphoscoliosis  Sleep apnea diagnosed in 2016  Restrictive physiology from past PFTs    Past Medical History:  Diagnosis Date   Anxiety    Asthma    Back pain    Chronic combined systolic and diastolic CHF (congestive heart failure) (HCC) 07/2011   a. Prior Echo: EF was 25-30%; b. No Sig CAD on CATH; c. No significant finding on Cardiac MRI; d. follow-up Echo 2014 --  EF of 50%;  e. 03/2014 Echo: EF 40-45%, gr 1 DD; f. 11/2014 Echo: EF 40-45%, Gr 2 DD.   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   Depression    Dyspnea    Essential hypertension    Family history of breast cancer    Family history of colon cancer    Fibromyalgia    Gallbladder problem    Hypothyroidism    IBS (irritable bowel syndrome)    Morbid obesity (HCC)    Nonischemic cardiomyopathy (HCC)    a. unclear etiology;  b. 01/2012 Cardiac MRI: no infiltrative pathology, EF 52%;  b. 11/2014 Echo: EF 40-45%, gr 2 DD.   Scoliosis    Sleep apnea    Stomach problems    Thyroid  disease    Vitamin B 12 deficiency    Vitamin D  deficiency    Social History   Socioeconomic  History   Marital status: Married    Spouse name: Thedora Hamming   Number of children: 0   Years of education: Not on file   Highest education level: Master's degree (e.g., MA, MS, MEng, MEd, MSW, MBA)  Occupational History   Occupation: Programmer, multimedia  Tobacco Use   Smoking status: Never   Smokeless tobacco: Never  Vaping Use   Vaping status: Never Used  Substance and Sexual Activity   Alcohol use: No   Drug use: No   Sexual activity: Not on file  Other Topics Concern   Not on file  Social History Narrative   Not on file   Social Drivers of Health   Financial Resource Strain: Low Risk  (07/23/2023)   Overall Financial Resource Strain (CARDIA)    Difficulty of Paying Living Expenses: Not very hard  Food Insecurity: No Food Insecurity (07/23/2023)   Hunger Vital Sign    Worried About Running Out of Food in the Last Year: Never true    Ran Out of Food in the Last Year: Never true  Transportation Needs: No Transportation Needs (07/23/2023)   PRAPARE - Administrator, Civil Service (Medical): No    Lack of Transportation (Non-Medical): No  Physical Activity: Insufficiently Active (07/23/2023)  Exercise Vital Sign    Days of Exercise per Week: 2 days    Minutes of Exercise per Session: 10 min  Stress: Stress Concern Present (07/23/2023)   [employer] of Occupational Health - Occupational Stress Questionnaire    Feeling of Stress : Rather much  Social Connections: Unknown (07/23/2023)   Social Connection and Isolation Panel    Frequency of Communication with Friends and Family: Twice a week    Frequency of Social Gatherings with Friends and Family: Not on file    Attends Religious Services: Never    Database administrator or Organizations: No    Attends Engineer, structural: Not on file    Marital Status: Married  Catering manager Violence: Not At Risk (03/27/2023)   Humiliation, Afraid, Rape, and Kick questionnaire    Fear of Current or Ex-Partner: No     Emotionally Abused: No    Physically Abused: No    Sexually Abused: No   Family History  Problem Relation Age of Onset   Depression Mother    Alcoholism Mother    Hypertension Father    Heart disease Father    Colon cancer Maternal Grandmother 62   Heart attack Maternal Grandmother    Heart disease Maternal Grandfather    Heart attack Maternal Grandfather    Heart disease Paternal Grandmother    Breast cancer Paternal Grandmother 36       possible bilateral cancer   Asthma Paternal Grandmother    Heart attack Paternal Grandmother    Heart attack Paternal Grandfather    Breast cancer Maternal Aunt 50   Breast cancer Paternal Aunt 50   Stroke Neg Hx    Thyroid  disease Neg Hx        Review of Systems  Constitutional:  Negative for fatigue and fever.  Respiratory:  Positive for apnea and shortness of breath. Negative for cough.   Genitourinary:  Negative for dysuria.  Skin:  Negative for rash.  Allergic/Immunologic: Negative.   Psychiatric/Behavioral:  Positive for sleep disturbance. Negative for dysphoric mood.        Objective:   Physical Exam Constitutional:      General: She is not in acute distress.    Appearance: She is obese.  HENT:     Head: Normocephalic and atraumatic.     Mouth/Throat:     Mouth: Mucous membranes are moist.  Eyes:     General: No scleral icterus.    Pupils: Pupils are equal, round, and reactive to light.  Cardiovascular:     Rate and Rhythm: Normal rate and regular rhythm.     Heart sounds: No murmur heard.    No friction rub.  Pulmonary:     Effort: Pulmonary effort is normal. No respiratory distress.     Breath sounds: Normal breath sounds. No stridor. No wheezing or rhonchi.  Musculoskeletal:     Cervical back: No rigidity or tenderness.  Neurological:     Mental Status: She is alert.  Psychiatric:        Mood and Affect: Mood normal.        Behavior: Behavior normal.    Vitals:   02/17/24 0907  BP: (!) 84/60  Pulse: 89   SpO2: 94%    CT scan of the chest reviewed-10/01/2018-reviewed by myself with the patient, previous CT 885[address] 19 also reviewed, 10/10/2016 reviewed  Last CT scan of the chest 05/01/2022 with basilar atelectasis-reviewed by myself  Echocardiogram 05/09/2023 - Ejection fraction low normal 50-55%  Pulmonary function study from 08/25/2014 reviewed showing severe restriction PFT from 04/28/2019 with severe restrictive disease   CPAP compliance reviewed 96% compliance Average use of 8 hours 7 minutes AutoSet 5-18 No significant mask leaks AHI of 0.5    Assessment & Plan:   Obstructive sleep apnea - Continues to tolerate CPAP - Download from machine shows excellent compliance with optimal treatment of sleep disordered breathing  Obesity hypoventilation - Continues to work on weight loss efforts - Trying to stay active and exercising regularly  History of systolic and diastolic heart failure Cardiomyopathy - Follows up with cardiology - Symptoms have been relatively stable  Severe restrictive lung disease Kyphoscoliosis - Continue graded activities - Weight loss efforts  Sleep apnea appears well treated at present time  Chronic respiratory failure likely related to restrictive lung disease - Continue oxygen  supplementation as needed  Follow-up in 6 months  Encouraged to call with significant concerns  I spent 30 minutes dedicated to the care of this patient on the date of this encounter to include previsit review of records, face-to-face time with the patient discussing conditions above, post visit ordering of testing,ordering medications,independentlyinterpreting results, clinical documentation with electronic health record and communicated necessary findings to members of the patient's care team

## 2024-02-17 NOTE — Patient Instructions (Addendum)
 Follow-up in 6 months  Continue using CPAP on a nightly basis  Continue graded exercise as tolerated  Call us  with significant concerns  Download from your machine shows excellent compliance Average use of 8 hours 7 minutes Number of events is 0.5 which is excellent

## 2024-02-23 ENCOUNTER — Other Ambulatory Visit (HOSPITAL_COMMUNITY): Payer: Self-pay | Admitting: Cardiology

## 2024-03-10 ENCOUNTER — Encounter: Payer: Self-pay | Admitting: Internal Medicine

## 2024-03-10 ENCOUNTER — Ambulatory Visit (INDEPENDENT_AMBULATORY_CARE_PROVIDER_SITE_OTHER): Admitting: Internal Medicine

## 2024-03-10 VITALS — BP 102/60 | HR 86 | Temp 99.0°F | Ht 60.0 in | Wt 214.6 lb

## 2024-03-10 DIAGNOSIS — J4531 Mild persistent asthma with (acute) exacerbation: Secondary | ICD-10-CM

## 2024-03-10 DIAGNOSIS — J0100 Acute maxillary sinusitis, unspecified: Secondary | ICD-10-CM | POA: Diagnosis not present

## 2024-03-10 MED ORDER — PROMETHAZINE-DM 6.25-15 MG/5ML PO SYRP: 5.0000 mL | ORAL_SOLUTION | Freq: Four times a day (QID) | ORAL | 0 refills | Status: DC | PRN

## 2024-03-10 MED ORDER — AMOXICILLIN-POT CLAVULANATE 875-125 MG PO TABS: 1.0000 | ORAL_TABLET | Freq: Two times a day (BID) | ORAL | 0 refills | Status: AC

## 2024-03-10 MED ORDER — PREDNISONE 20 MG PO TABS: 40.0000 mg | ORAL_TABLET | Freq: Every day | ORAL | 0 refills | Status: DC

## 2024-03-10 NOTE — Assessment & Plan Note (Signed)
 Rx augmentin  to start taking in 1-2 days if prednisone  is not effective. Symptoms started 2 weeks ago and is high risk for progression.

## 2024-03-10 NOTE — Progress Notes (Signed)
 Subjective:   Patient ID: Meredith Diaz, female    DOB: 16-Nov-1980, 43 y.o.   MRN: [id number]  Discussed the use of AI scribe software for clinical note transcription with the patient, who gave verbal consent to proceed. History of Present Illness Meredith Diaz is a 43 year old female who presents with worsening sinus congestion and cough.  She has been experiencing sinus congestion that began with mild sniffles and progressed to significant pressure in her head by last Thursday. The pressure is described as 'wham in my head' and is not accompanied by drainage. The pressure is localized to her head, with no symptoms below the neck.  Over the weekend, she developed a cough that is occasionally wet but not consistently productive. The cough has become painful, and she feels as though she may have pulled a muscle, as it hurts to move her arm or breathe deeply. Laughing and deep breathing are also painful.  She reports feeling tightness in her chest and has been wheezing, which makes her feel like she cannot get enough air. Despite using a pulse oximeter that shows normal oxygen  levels, she feels 'gasping for air' and has been using supplemental oxygen  at night and more frequently during the day over the past week.  She has not been sleeping well due to the cough and feels tired from the effort of breathing. Her eyes are swollen, and she has been experiencing sinus pressure and pain, with some redness and inflammation noted in her throat. She has been using saline rinses and steam to alleviate symptoms.  No significant drainage from her sinuses, occasional wet cough, chest tightness, wheezing, and difficulty breathing. She has been pulling on her ears due to pressure, and her ear looks inflamed and hurts. Her oxygen  levels are reportedly normal despite feeling short of breath.  Review of Systems  Constitutional:  Positive for activity change and appetite change. Negative for chills,  fatigue, fever and unexpected weight change.  HENT:  Positive for congestion, postnasal drip, rhinorrhea, sinus pressure and sinus pain. Negative for ear discharge, ear pain, sneezing, sore throat, tinnitus, trouble swallowing and voice change.   Eyes: Negative.   Respiratory:  Positive for cough and shortness of breath. Negative for chest tightness and wheezing.   Cardiovascular: Negative.   Gastrointestinal: Negative.   Musculoskeletal:  Positive for myalgias.  Neurological: Negative.     Objective:  Physical Exam Constitutional:      Appearance: She is well-developed.  HENT:     Head: Normocephalic and atraumatic.     Comments: Oropharynx with redness and clear drainage, nose with swollen turbinates, TMs normal bilaterally.  Neck:     Thyroid : No thyromegaly.  Cardiovascular:     Rate and Rhythm: Normal rate and regular rhythm.  Pulmonary:     Effort: Pulmonary effort is normal. No respiratory distress.     Breath sounds: Normal breath sounds. No wheezing or rales.  Abdominal:     Palpations: Abdomen is soft.  Musculoskeletal:        General: Tenderness present.     Cervical back: Normal range of motion.  Lymphadenopathy:     Cervical: No cervical adenopathy.  Skin:    General: Skin is warm and dry.  Neurological:     Mental Status: She is alert and oriented to person, place, and time.     Vitals:   03/10/24 0803  BP: 102/60  Pulse: 86  Temp: 99 F (37.2 C)  TempSrc: Oral  SpO2: 98%  Weight: 214 lb 9.6 oz (97.3 kg)  Height: 5' (1.524 m)    Assessment and Plan Assessment & Plan Acute sinusitis with upper respiratory symptoms and airway inflammation related to early asthma exacerbation Acute sinusitis presents with sinus pressure, cough, and airway inflammation. Early intervention is necessary to prevent progression. Prescribe prednisone  to reduce inflammation and improve airway patency. If no improvement with prednisone  alone, prescribe antibiotics. Recommend  saline rinse and steam inhalation for symptomatic relief. Prescribe cough suppressant for nighttime use to improve sleep quality.

## 2024-03-10 NOTE — Assessment & Plan Note (Signed)
 Early asthma exacerbation. Rx prednisone  5 day course to avert. Continue breyna  and oxygen  which she has been using more lately. Continue albuterol  nebulizer and inhaler as needed. Rx promethazine/dm cough medicine.

## 2024-03-10 NOTE — Patient Instructions (Signed)
 We will send in the prednisone  to take 2 pills daily for 5 days. Give this 1-2 days and if improving wait it out. If not improving or worsening start the augmentin  (antibiotic).  We have sent in the cough medicine to use to cough less.

## 2024-03-26 ENCOUNTER — Other Ambulatory Visit (HOSPITAL_COMMUNITY): Payer: Self-pay | Admitting: Family Medicine

## 2024-05-13 ENCOUNTER — Encounter: Payer: Self-pay | Admitting: Internal Medicine

## 2024-05-13 ENCOUNTER — Ambulatory Visit (INDEPENDENT_AMBULATORY_CARE_PROVIDER_SITE_OTHER)

## 2024-05-13 ENCOUNTER — Ambulatory Visit: Payer: Self-pay | Admitting: Internal Medicine

## 2024-05-13 ENCOUNTER — Ambulatory Visit: Admitting: Internal Medicine

## 2024-05-13 VITALS — BP 132/80 | HR 98 | Temp 97.9°F | Resp 20 | Ht 60.0 in | Wt 216.4 lb

## 2024-05-13 DIAGNOSIS — E039 Hypothyroidism, unspecified: Secondary | ICD-10-CM

## 2024-05-13 DIAGNOSIS — R0981 Nasal congestion: Secondary | ICD-10-CM

## 2024-05-13 DIAGNOSIS — E118 Type 2 diabetes mellitus with unspecified complications: Secondary | ICD-10-CM

## 2024-05-13 DIAGNOSIS — E785 Hyperlipidemia, unspecified: Secondary | ICD-10-CM | POA: Diagnosis not present

## 2024-05-13 DIAGNOSIS — E1169 Type 2 diabetes mellitus with other specified complication: Secondary | ICD-10-CM | POA: Diagnosis not present

## 2024-05-13 DIAGNOSIS — J4531 Mild persistent asthma with (acute) exacerbation: Secondary | ICD-10-CM

## 2024-05-13 DIAGNOSIS — R051 Acute cough: Secondary | ICD-10-CM | POA: Diagnosis not present

## 2024-05-13 DIAGNOSIS — R9431 Abnormal electrocardiogram [ECG] [EKG]: Secondary | ICD-10-CM | POA: Diagnosis not present

## 2024-05-13 DIAGNOSIS — I5042 Chronic combined systolic (congestive) and diastolic (congestive) heart failure: Secondary | ICD-10-CM | POA: Diagnosis not present

## 2024-05-13 LAB — HEPATIC FUNCTION PANEL
ALT: 23 U/L (ref 3–35)
AST: 20 U/L (ref 5–37)
Albumin: 3.8 g/dL (ref 3.5–5.2)
Alkaline Phosphatase: 98 U/L (ref 39–117)
Bilirubin, Direct: 0.1 mg/dL (ref 0.1–0.3)
Total Bilirubin: 0.5 mg/dL (ref 0.2–1.2)
Total Protein: 7.6 g/dL (ref 6.0–8.3)

## 2024-05-13 LAB — BASIC METABOLIC PANEL WITH GFR
BUN: 22 mg/dL (ref 6–23)
CO2: 34 meq/L — ABNORMAL HIGH (ref 19–32)
Calcium: 9.1 mg/dL (ref 8.4–10.5)
Chloride: 96 meq/L (ref 96–112)
Creatinine, Ser: 0.99 mg/dL (ref 0.40–1.20)
GFR: 69.47 mL/min
Glucose, Bld: 175 mg/dL — ABNORMAL HIGH (ref 70–99)
Potassium: 3.8 meq/L (ref 3.5–5.1)
Sodium: 138 meq/L (ref 135–145)

## 2024-05-13 LAB — CBC WITH DIFFERENTIAL/PLATELET
Basophils Absolute: 0.1 K/uL (ref 0.0–0.1)
Basophils Relative: 0.9 % (ref 0.0–3.0)
Eosinophils Absolute: 0.2 K/uL (ref 0.0–0.7)
Eosinophils Relative: 2.3 % (ref 0.0–5.0)
HCT: 42.2 % (ref 36.0–46.0)
Hemoglobin: 14.1 g/dL (ref 12.0–15.0)
Lymphocytes Relative: 16 % (ref 12.0–46.0)
Lymphs Abs: 1.6 K/uL (ref 0.7–4.0)
MCHC: 33.4 g/dL (ref 30.0–36.0)
MCV: 89.5 fl (ref 78.0–100.0)
Monocytes Absolute: 1.2 K/uL — ABNORMAL HIGH (ref 0.1–1.0)
Monocytes Relative: 11.6 % (ref 3.0–12.0)
Neutro Abs: 7.2 K/uL (ref 1.4–7.7)
Neutrophils Relative %: 69.2 % (ref 43.0–77.0)
Platelets: 320 K/uL (ref 150.0–400.0)
RBC: 4.72 Mil/uL (ref 3.87–5.11)
RDW: 15.2 % (ref 11.5–15.5)
WBC: 10.3 K/uL (ref 4.0–10.5)

## 2024-05-13 LAB — LIPID PANEL
Cholesterol: 186 mg/dL (ref 28–200)
HDL: 48.1 mg/dL
LDL Cholesterol: 112 mg/dL — ABNORMAL HIGH (ref 10–99)
NonHDL: 137.6
Total CHOL/HDL Ratio: 4
Triglycerides: 130 mg/dL (ref 10.0–149.0)
VLDL: 26 mg/dL (ref 0.0–40.0)

## 2024-05-13 LAB — POC COVID19 BINAXNOW: SARS Coronavirus 2 Ag: NEGATIVE

## 2024-05-13 LAB — HEMOGLOBIN A1C: Hgb A1c MFr Bld: 8.7 % — ABNORMAL HIGH (ref 4.6–6.5)

## 2024-05-13 LAB — POCT INFLUENZA A/B
Influenza A, POC: NEGATIVE
Influenza B, POC: NEGATIVE

## 2024-05-13 LAB — BRAIN NATRIURETIC PEPTIDE: Pro B Natriuretic peptide (BNP): 45 pg/mL (ref 1.0–100.0)

## 2024-05-13 LAB — TSH: TSH: 4.98 u[IU]/mL (ref 0.35–5.50)

## 2024-05-13 LAB — TROPONIN I (HIGH SENSITIVITY): High Sens Troponin I: 6 ng/L (ref 2–17)

## 2024-05-13 MED ORDER — OZEMPIC (0.25 OR 0.5 MG/DOSE) 2 MG/3ML ~~LOC~~ SOPN: 0.2500 mg | PEN_INJECTOR | SUBCUTANEOUS | 0 refills | Status: AC

## 2024-05-13 MED ORDER — EMPAGLIFLOZIN 25 MG PO TABS: 25.0000 mg | ORAL_TABLET | Freq: Every day | ORAL | 0 refills | Status: AC

## 2024-05-13 MED ORDER — INSULIN PEN NEEDLE 32G X 6 MM MISC: 1.0000 | 0 refills | Status: AC

## 2024-05-13 MED ORDER — COVID-19 MRNA VAC-TRIS(PFIZER) 30 MCG/0.3ML IM SUSY: 0.3000 mL | PREFILLED_SYRINGE | Freq: Once | INTRAMUSCULAR | 0 refills | Status: AC

## 2024-05-13 MED ORDER — ROSUVASTATIN CALCIUM 10 MG PO TABS: 10.0000 mg | ORAL_TABLET | Freq: Every day | ORAL | 0 refills | Status: AC

## 2024-05-13 MED ORDER — METHYLPREDNISOLONE ACETATE 80 MG/ML IJ SUSP
80.0000 mg | Freq: Once | INTRAMUSCULAR | Status: AC
  Administered 2024-05-13: 80 mg via INTRAMUSCULAR

## 2024-05-13 NOTE — Patient Instructions (Signed)

## 2024-05-13 NOTE — Progress Notes (Signed)
 "  Subjective:  Patient ID: Meredith Diaz, female    DOB: Oct 29, 1980  Age: 44 y.o. MRN: [id number]  CC: Acute Visit   HPI Meredith Diaz presents for f/up ---  Discussed the use of AI scribe software for clinical note transcription with the patient, who gave verbal consent to proceed.  History of Present Illness Meredith Diaz is a 44 year old female with asthma and heart failure who presents with respiratory distress and chest tightness.  She began feeling unwell on Monday evening with a general sense of malaise. By Wednesday morning, she experienced significant respiratory distress, characterized by chest tightness and difficulty breathing. She feels like she is 'fighting to move' and 'can't get enough oxygen '.  She uses an oxygen  concentrator at home and monitors her oxygen  levels with a pulse oximeter. Her oxygen  saturation is adequate while using the concentrator but decreases when not using it. She uses an albuterol  inhaler, a Symbicort  inhaler, and an albuterol  nebulizer as needed. She used the nebulizer yesterday, which provided some relief. No significant coughing, but she reports a slight cough and chest tightness without significant pain. She feels a little warm but has not noted any fever or chills.  She has not received her COVID booster this year but has been vaccinated for the flu. She reports a scratchy throat, a sensation of ear fullness, and a feeling of being underwater, but denies significant sore throat or ear pain. Occasional wheezing is present, and she denies night sweats.  Regarding her heart failure, she does not feel she is retaining more fluid than usual. She is on diuretics but has not taken them yet today.     Outpatient Medications Prior to Visit  Medication Sig Dispense Refill   albuterol  (PROVENTIL ) (2.5 MG/3ML) 0.083% nebulizer solution Take 3 mLs (2.5 mg total) by nebulization every 6 (six) hours as needed for wheezing or shortness of  breath. 75 mL 12   albuterol  (VENTOLIN  HFA) 108 (90 Base) MCG/ACT inhaler Inhale 2 puffs into the lungs every 4 (four) hours as needed for wheezing or shortness of breath. 8.5 each 6   budesonide -formoterol  (BREYNA ) 160-4.5 MCG/ACT inhaler Inhale 2 puffs into the lungs 2 (two) times daily. 1 each 6   buPROPion  (WELLBUTRIN  XL) 150 MG 24 hr tablet Take 3 tablets (450 mg total) by mouth daily. 270 tablet 3   cholestyramine  light (PREVALITE ) 4 g packet Take 4 g by mouth as needed.     clobetasol  cream (TEMOVATE ) 0.05 % Apply 1 application topically See admin instructions. Apply daily to affected areas of genital area as directed (Patient taking differently: Apply 1 application  topically daily as needed. Apply daily to affected areas of genital area as directed)     levothyroxine  (SYNTHROID ) 112 MCG tablet Take 1 tablet (112 mcg total) by mouth daily. 90 tablet 3   metoprolol  succinate (TOPROL -XL) 25 MG 24 hr tablet Take 1 tablet (25 mg total) by mouth daily. 90 tablet 3   potassium chloride  SA (KLOR-CON  M) 20 MEQ tablet TAKE 3 TABLETS (60 MEQ TOTAL) BY MOUTH DAILY. 270 tablet 2   promethazine -dextromethorphan  (PROMETHAZINE -DM) 6.25-15 MG/5ML syrup Take 5 mLs by mouth 4 (four) times daily as needed. 118 mL 0   spironolactone  (ALDACTONE ) 25 MG tablet Take 1 tablet (25 mg total) by mouth daily. 90 tablet 3   torsemide  (DEMADEX ) 20 MG tablet TAKE 4 TABLETS BY MOUTH IN THE MORNING AND 3 TABLET EVERY EVENING 630 tablet 1   valsartan  (DIOVAN ) 80  MG tablet TAKE 1 TABLET BY MOUTH EVERY DAY 90 tablet 3   empagliflozin  (JARDIANCE ) 10 MG TABS tablet Take 1 tablet (10 mg total) by mouth daily before breakfast. 30 tablet 11   predniSONE  (DELTASONE ) 20 MG tablet Take 2 tablets (40 mg total) by mouth daily with breakfast. 10 tablet 0   Semaglutide , 1 MG/DOSE, 4 MG/3ML SOPN Inject 1 mg as directed once a week. 3 mL 0   Semaglutide , 2 MG/DOSE, 8 MG/3ML SOPN Inject 2 mg as directed once a week. 3 mL 5   montelukast   (SINGULAIR ) 10 MG tablet Take 1 tablet (10 mg total) by mouth at bedtime. (Patient not taking: Reported on 05/13/2024) 90 tablet 3   No facility-administered medications prior to visit.    ROS Review of Systems  Constitutional:  Positive for fatigue. Negative for appetite change, chills, fever and unexpected weight change.  HENT: Negative.  Negative for sore throat, trouble swallowing and voice change.   Eyes: Negative.   Respiratory:  Positive for cough, chest tightness, shortness of breath and wheezing. Negative for apnea, choking and stridor.   Cardiovascular:  Negative for chest pain, palpitations and leg swelling.  Gastrointestinal: Negative.  Negative for abdominal pain, constipation, diarrhea, nausea and vomiting.  Endocrine: Negative.   Genitourinary: Negative.  Negative for difficulty urinating, dysuria and hematuria.  Musculoskeletal: Negative.  Negative for arthralgias and myalgias.  Skin: Negative.  Negative for color change.  Neurological:  Negative for dizziness, weakness, light-headedness and numbness.  Hematological:  Negative for adenopathy. Does not bruise/bleed easily.  Psychiatric/Behavioral: Negative.      Objective:  BP 132/80 (BP Location: Left Arm, Patient Position: Sitting)   Pulse 98   Temp 97.9 F (36.6 C) (Temporal)   Resp 20   Ht 5' (1.524 m)   Wt 216 lb 6.4 oz (98.2 kg)   SpO2 91%   BMI 42.26 kg/m   BP Readings from Last 3 Encounters:  05/13/24 132/80  03/10/24 102/60  02/17/24 (!) 84/60    Wt Readings from Last 3 Encounters:  05/13/24 216 lb 6.4 oz (98.2 kg)  03/10/24 214 lb 9.6 oz (97.3 kg)  02/17/24 215 lb 9.6 oz (97.8 kg)    Physical Exam Constitutional:      General: She is not in acute distress.    Appearance: She is ill-appearing (continuous oxygen ). She is not toxic-appearing or diaphoretic.  HENT:     Nose: Nose normal.     Mouth/Throat:     Mouth: Mucous membranes are moist.     Pharynx: Oropharynx is clear. Uvula midline. No  pharyngeal swelling, oropharyngeal exudate or posterior oropharyngeal erythema.     Tonsils: No tonsillar exudate or tonsillar abscesses.  Eyes:     General: No scleral icterus.    Conjunctiva/sclera: Conjunctivae normal.  Cardiovascular:     Rate and Rhythm: Normal rate and regular rhythm.     Heart sounds: No murmur heard.    No friction rub. No gallop.     Comments: EKG--- NSR, 83 bpm LAE Septal infarct pattern is not new Unchanged  Pulmonary:     Effort: Pulmonary effort is normal.     Breath sounds: Examination of the right-upper field reveals decreased breath sounds. Examination of the left-upper field reveals decreased breath sounds. Examination of the right-middle field reveals decreased breath sounds. Examination of the left-middle field reveals decreased breath sounds. Examination of the right-lower field reveals decreased breath sounds. Examination of the left-lower field reveals decreased breath sounds. Decreased breath sounds  present. No wheezing, rhonchi or rales.  Abdominal:     General: Abdomen is flat.     Palpations: There is no mass.     Tenderness: There is no abdominal tenderness. There is no guarding.     Hernia: No hernia is present.  Musculoskeletal:        General: Normal range of motion.     Cervical back: Neck supple.     Right lower leg: No edema.     Left lower leg: No edema.  Lymphadenopathy:     Cervical: No cervical adenopathy.  Skin:    General: Skin is warm and dry.  Neurological:     General: No focal deficit present.     Mental Status: She is alert.  Psychiatric:        Mood and Affect: Mood normal.        Behavior: Behavior normal.     Lab Results  Component Value Date   WBC 10.3 05/13/2024   HGB 14.1 05/13/2024   HCT 42.2 05/13/2024   PLT 320.0 05/13/2024   GLUCOSE 175 (H) 05/13/2024   CHOL 186 05/13/2024   TRIG 130.0 05/13/2024   HDL 48.10 05/13/2024   LDLCALC 112 (H) 05/13/2024   ALT 23 05/13/2024   AST 20 05/13/2024   NA  138 05/13/2024   K 3.8 05/13/2024   CL 96 05/13/2024   CREATININE 0.99 05/13/2024   BUN 22 05/13/2024   CO2 34 (H) 05/13/2024   TSH 4.98 05/13/2024   INR 1.1 07/03/2018   HGBA1C 8.7 (H) 05/13/2024   MICROALBUR 1.3 07/24/2023    DG Chest 2 View Result Date: 10/11/2023 CLINICAL DATA:  Shortness of Breath EXAM: CHEST - 2 VIEW COMPARISON:  05/01/2022 FINDINGS: Cardiomediastinal silhouette and pulmonary vasculature within normal limits. Unchanged widening of the superior mediastinum, which appears to be related to lipomatosis given appearance on prior chest CT. Unchanged linear opacities at the LEFT lung base consistent with scarring. Lungs are otherwise clear. IMPRESSION: No acute cardiopulmonary process. Electronically Signed   By: Aliene Lloyd M.D.   On: 10/11/2023 07:20    DG Chest 2 View Result Date: 05/13/2024 CLINICAL DATA:  Shortness of breath, chest tightness EXAM: CHEST - 2 VIEW COMPARISON:  Oct 03, 2023.  May 01, 2022. FINDINGS: Stable cardiomediastinal silhouette. Both lungs are clear. The visualized skeletal structures are unremarkable. IMPRESSION: No active cardiopulmonary disease. Electronically Signed   By: Lynwood Landy Raddle M.D.   On: 05/13/2024 08:57    The 10-year ASCVD risk score (Arnett DK, et al., 2019) is: 2.3%   Values used to calculate the score:     Age: 30 years     Clinically relevant sex: Female     Is Non-Hispanic African American: No     Diabetic: Yes     Tobacco smoker: No     Systolic Blood Pressure: 132 mmHg     Is BP treated: Yes     HDL Cholesterol: 48.1 mg/dL     Total Cholesterol: 186 mg/dL   Estimated Creatinine Clearance: 76.2 mL/min (by C-G formula based on SCr of 0.99 mg/dL).    Assessment & Plan:  Nasal congestion -     POC COVID-19 BinaxNow -     POCT Influenza A/B  Acute cough -     DG Chest 2 View; Future -     EKG 12-Lead -     methylPREDNISolone  Acetate  Chronic combined systolic and diastolic heart failure, NYHA class 3 (HCC)-  No  signs of fluid excess. -     EKG 12-Lead -     Brain natriuretic peptide; Future -     Troponin I (High Sensitivity); Future -     COVID-19 mRNA Vac-TriS(Pfizer); Inject 0.3 mLs into the muscle once for 1 dose.  Dispense: 0.3 mL; Refill: 0 -     Empagliflozin ; Take 1 tablet (25 mg total) by mouth daily.  Dispense: 90 tablet; Refill: 0 -     AMB Referral [facility] Care Management  Hyperlipidemia associated with type 2 diabetes mellitus (HCC)- Will start a statin for CV risk reduction. -     Lipid panel; Future -     Hepatic function panel; Future -     TSH; Future -     Rosuvastatin  Calcium ; Take 1 tablet (10 mg total) by mouth daily.  Dispense: 90 tablet; Refill: 0 -     AMB Referral [facility] Care Management  Diabetes mellitus type 2 with complications (HCC)- Will try to achieve better blood sugar control. -     Basic metabolic panel with GFR; Future -     Hemoglobin A1c; Future -     COVID-19 mRNA Vac-TriS(Pfizer); Inject 0.3 mLs into the muscle once for 1 dose.  Dispense: 0.3 mL; Refill: 0 -     Ozempic  (0.25 or 0.5 MG/DOSE); Inject 0.25 mg into the skin once a week.  Dispense: 3 mL; Refill: 0 -     Insulin  Pen Needle; 1 Act by Does not apply route once a week.  Dispense: 100 each; Refill: 0 -     Empagliflozin ; Take 1 tablet (25 mg total) by mouth daily.  Dispense: 90 tablet; Refill: 0 -     AMB Referral [facility] Care Management  Mild persistent asthma with exacerbation -     CBC with Differential/Platelet; Future -     COVID-19 mRNA Vac-TriS(Pfizer); Inject 0.3 mLs into the muscle once for 1 dose.  Dispense: 0.3 mL; Refill: 0 -     methylPREDNISolone  Acetate -     AMB Referral [facility] Care Management  Abnormal electrocardiogram (ECG) (EKG) -     Brain natriuretic peptide; Future -     Troponin I (High Sensitivity); Future  Acquired hypothyroidism -     TSH; Future     Follow-up: Return if symptoms worsen or fail to improve.  Debby Molt, MD "

## 2024-05-15 ENCOUNTER — Telehealth: Admitting: Family Medicine

## 2024-05-15 ENCOUNTER — Telehealth: Payer: Self-pay | Admitting: Internal Medicine

## 2024-05-15 DIAGNOSIS — B9689 Other specified bacterial agents as the cause of diseases classified elsewhere: Secondary | ICD-10-CM | POA: Diagnosis not present

## 2024-05-15 DIAGNOSIS — J4540 Moderate persistent asthma, uncomplicated: Secondary | ICD-10-CM

## 2024-05-15 DIAGNOSIS — R051 Acute cough: Secondary | ICD-10-CM | POA: Diagnosis not present

## 2024-05-15 DIAGNOSIS — J019 Acute sinusitis, unspecified: Secondary | ICD-10-CM

## 2024-05-15 MED ORDER — PROMETHAZINE-DM 6.25-15 MG/5ML PO SYRP: 5.0000 mL | ORAL_SOLUTION | Freq: Four times a day (QID) | ORAL | 0 refills | Status: AC | PRN

## 2024-05-15 MED ORDER — PREDNISONE 20 MG PO TABS: 20.0000 mg | ORAL_TABLET | Freq: Two times a day (BID) | ORAL | 0 refills | Status: AC

## 2024-05-15 MED ORDER — DOXYCYCLINE HYCLATE 100 MG PO TABS: 100.0000 mg | ORAL_TABLET | Freq: Two times a day (BID) | ORAL | 0 refills | Status: AC

## 2024-05-15 NOTE — Progress Notes (Unsigned)
 Care Guide Pharmacy Note  05/15/2024 Name: Meredith Diaz MRN: [id number] DOB: [birth year]  Referred By: Rollene Almarie LABOR, MD Reason for referral: Complex Care Management and Call Attempt #1 (Outreach to sch ref w/ pharm)   Meredith Diaz is a 44 y.o. year old female who is a primary care patient of Rollene Almarie LABOR, MD.  Meredith Diaz was referred to the pharmacist for assistance related to: DMII and COPD.  An unsuccessful telephone outreach was attempted today to contact the patient who was referred to the pharmacy team for assistance with medication management. Additional attempts will be made to contact the patient.  Doyce Razor [facility], [facility] Guide Direct Dial:   Fax: [phone]

## 2024-05-15 NOTE — Progress Notes (Signed)
 " Virtual Visit Consent   Meredith Diaz, you are scheduled for a virtual visit with a [facility] provider today. Just as with appointments in the office, your consent must be obtained to participate. Your consent will be active for this visit and any virtual visit you may have with one of our providers in the next 365 days. If you have a MyChart account, a copy of this consent can be sent to you electronically.  As this is a virtual visit, video technology does not allow for your provider to perform a traditional examination. This may limit your provider's ability to fully assess your condition. If your provider identifies any concerns that need to be evaluated in person or the need to arrange testing (such as labs, EKG, etc.), we will make arrangements to do so. Although advances in technology are sophisticated, we cannot ensure that it will always work on either your end or our end. If the connection with a video visit is poor, the visit may have to be switched to a telephone visit. With either a video or telephone visit, we are not always able to ensure that we have a secure connection.  By engaging in this virtual visit, you consent to the provision of healthcare and authorize for your insurance to be billed (if applicable) for the services provided during this visit. Depending on your insurance coverage, you may receive a charge related to this service.  I need to obtain your verbal consent now. Are you willing to proceed with your visit today? Meredith Diaz has provided verbal consent on 05/15/2024 for a virtual visit (video or telephone). Loa Lamp, FNP  Date: 05/15/2024 12:22 PM   Virtual Visit via Video Note   I, Loa Lamp, connected with  Meredith Diaz  ([id number], [birth year]) on 05/15/2024 at 12:15 PM EST by a video-enabled telemedicine application and verified that I am speaking with the correct person using two identifiers.  Location: Patient: Virtual Visit Location  Patient: Home Provider: Virtual Visit Location Provider: Home Office   I discussed the limitations of evaluation and management by telemedicine and the availability of in person appointments. The patient expressed understanding and agreed to proceed.    History of Present Illness: Meredith Diaz is a 44 y.o. who identifies as a female who was assigned female at birth, and is being seen today for asthma and also has chf. Saw Dr. Joshua 1/7 with desone given. Head congestion. Sinus pain and pressure. Sx for 5 days. She has nebs and albuterol  to use at home. She Also has o2 in use via [facility]. In no distress.  HPI: HPI  Problems:  Patient Active Problem List   Diagnosis Date Noted   Nasal congestion 05/13/2024   Acute cough 05/13/2024   Allergic rhinitis 08/11/2023   Asthma exacerbation 08/11/2023   Routine general medical examination at a health care facility 07/26/2023   Right foot pain 06/05/2023   Pain in right ankle and joints of right foot 04/25/2023   Pain in lower limb 04/22/2023   Dysmenorrhea 04/26/2022   Proteinuria 04/26/2022   Diabetes mellitus type 2 with complications (HCC) 04/26/2022   Hyperlipidemia associated with type 2 diabetes mellitus (HCC) 03/05/2022   Mixed anxiety and depressive disorder 11/02/2020   Exercise hypoxemia 03/31/2018   Genetic testing 01/15/2017   Nocturnal hypoxemia 08/16/2016   Fatigue 03/16/2016   Acute sinusitis 01/13/2016   Morbid obesity (HCC) 11/15/2015   Left lumbar radiculopathy 10/12/2015   B12 deficiency 09/26/2015  Elevated WBC count 06/17/2015   Adjustment disorder with mixed anxiety and depressed mood 01/13/2015   Idiopathic scoliosis 10/14/2014   OSA on CPAP 10/14/2014   Restrictive lung disease 08/16/2014   Cardiomyopathy (HCC)    Chronic combined systolic and diastolic heart failure, NYHA class 3 (HCC) 03/09/2014   Essential hypertension 03/09/2014   Congenital heart disease, adult    Hypothyroidism 01/28/2014   Birth  control 01/28/2014   Asthma, chronic 01/28/2014    Allergies: Allergies[1] Medications: Current Medications[2]  Observations/Objective: Patient is well-developed, well-nourished in no acute distress.  Resting comfortably  at home.  Head is normocephalic, atraumatic.  No labored breathing.  Speech is clear and coherent with logical content.  Patient is alert and oriented at baseline.    Assessment and Plan: 1. Moderate persistent asthma without complication (Primary)  2. Acute bacterial sinusitis  3. Acute cough  Increase fluids, continue nebs and albuterol . UC if sx persist or worsen.   Follow Up Instructions: I discussed the assessment and treatment plan with the patient. The patient was provided an opportunity to ask questions and all were answered. The patient agreed with the plan and demonstrated an understanding of the instructions.  A copy of instructions were sent to the patient via MyChart unless otherwise noted below.     The patient was advised to call back or seek an in-person evaluation if the symptoms worsen or if the condition fails to improve as anticipated.    Keval Nam, FNP     [1]  Allergies Allergen Reactions   Lisinopril Swelling and Other (See Comments)    Angioedema facial, mostly just lips, no trouble breathing  [2]  Current Outpatient Medications:    albuterol  (PROVENTIL ) (2.5 MG/3ML) 0.083% nebulizer solution, Take 3 mLs (2.5 mg total) by nebulization every 6 (six) hours as needed for wheezing or shortness of breath., Disp: 75 mL, Rfl: 12   albuterol  (VENTOLIN  HFA) 108 (90 Base) MCG/ACT inhaler, Inhale 2 puffs into the lungs every 4 (four) hours as needed for wheezing or shortness of breath., Disp: 8.5 each, Rfl: 6   budesonide -formoterol  (BREYNA ) 160-4.5 MCG/ACT inhaler, Inhale 2 puffs into the lungs 2 (two) times daily., Disp: 1 each, Rfl: 6   buPROPion  (WELLBUTRIN  XL) 150 MG 24 hr tablet, Take 3 tablets (450 mg total) by mouth daily., Disp:  270 tablet, Rfl: 3   cholestyramine  light (PREVALITE ) 4 g packet, Take 4 g by mouth as needed., Disp: , Rfl:    clobetasol  cream (TEMOVATE ) 0.05 %, Apply 1 application topically See admin instructions. Apply daily to affected areas of genital area as directed (Patient taking differently: Apply 1 application  topically daily as needed. Apply daily to affected areas of genital area as directed), Disp: , Rfl:    doxycycline  (VIBRA -TABS) 100 MG tablet, Take 1 tablet (100 mg total) by mouth 2 (two) times daily for 10 days., Disp: 20 tablet, Rfl: 0   empagliflozin  (JARDIANCE ) 25 MG TABS tablet, Take 1 tablet (25 mg total) by mouth daily., Disp: 90 tablet, Rfl: 0   Insulin  Pen Needle 32G X 6 MM MISC, 1 Act by Does not apply route once a week., Disp: 100 each, Rfl: 0   levothyroxine  (SYNTHROID ) 112 MCG tablet, Take 1 tablet (112 mcg total) by mouth daily., Disp: 90 tablet, Rfl: 3   metoprolol  succinate (TOPROL -XL) 25 MG 24 hr tablet, Take 1 tablet (25 mg total) by mouth daily., Disp: 90 tablet, Rfl: 3   montelukast  (SINGULAIR ) 10 MG tablet, Take 1  tablet (10 mg total) by mouth at bedtime. (Patient not taking: Reported on 05/13/2024), Disp: 90 tablet, Rfl: 3   potassium chloride  SA (KLOR-CON  M) 20 MEQ tablet, TAKE 3 TABLETS (60 MEQ TOTAL) BY MOUTH DAILY., Disp: 270 tablet, Rfl: 2   predniSONE  (DELTASONE ) 20 MG tablet, Take 1 tablet (20 mg total) by mouth 2 (two) times daily with a meal for 5 days., Disp: 10 tablet, Rfl: 0   promethazine -dextromethorphan  (PROMETHAZINE -DM) 6.25-15 MG/5ML syrup, Take 5 mLs by mouth 4 (four) times daily as needed., Disp: 118 mL, Rfl: 0   rosuvastatin  (CRESTOR ) 10 MG tablet, Take 1 tablet (10 mg total) by mouth daily., Disp: 90 tablet, Rfl: 0   Semaglutide ,0.25 or 0.5MG /DOS, (OZEMPIC , 0.25 OR 0.5 MG/DOSE,) 2 MG/3ML SOPN, Inject 0.25 mg into the skin once a week., Disp: 3 mL, Rfl: 0   spironolactone  (ALDACTONE ) 25 MG tablet, Take 1 tablet (25 mg total) by mouth daily., Disp: 90 tablet,  Rfl: 3   torsemide  (DEMADEX ) 20 MG tablet, TAKE 4 TABLETS BY MOUTH IN THE MORNING AND 3 TABLET EVERY EVENING, Disp: 630 tablet, Rfl: 1   valsartan  (DIOVAN ) 80 MG tablet, TAKE 1 TABLET BY MOUTH EVERY DAY, Disp: 90 tablet, Rfl: 3  "

## 2024-05-15 NOTE — Patient Instructions (Signed)
 Asthma, Adult  Asthma is a long-term (chronic) condition that causes recurrent episodes in which the lower airways in the lungs become tight and narrow. The narrowing is caused by inflammation and tightening of the smooth muscle around the lower airways. Asthma episodes, also called asthma attacks or asthma flares, may cause coughing, making high-pitched whistling sounds when you breathe, most often when you breathe out (wheezing), shortness of breath, and chest pain. The airways may produce extra mucus caused by the inflammation and irritation. During an attack, it can be difficult to breathe. Asthma attacks can range from minor to life-threatening. Asthma cannot be cured, but medicines and lifestyle changes can help control it and treat acute attacks. It is important to keep your asthma well controlled so the condition does not interfere with your daily life. What are the causes? This condition is believed to be caused by inherited (genetic) and environmental factors, but its exact cause is not known. What can trigger an asthma attack? Many things can bring on an asthma attack or make symptoms worse. These triggers are different for every person. Common triggers include: Allergens and irritants like mold, dust, pet dander, cockroaches, pollen, air pollution, and chemical odors. Cigarette smoke. Weather changes and cold air. Stress and strong emotional responses such as crying or laughing hard. Certain medications such as aspirin  or beta blockers. Infections and inflammatory conditions, such as the flu, a cold, pneumonia, or inflammation of the nasal membranes (rhinitis). Gastroesophageal reflux disease (GERD). What are the signs or symptoms? Symptoms may occur right after exposure to an asthma trigger or hours later and can vary by person. Common signs and symptoms include: Wheezing. Trouble breathing (shortness of breath). Excessive nighttime or early morning coughing. Chest  tightness. Tiredness (fatigue) with minimal activity. Difficulty talking in complete sentences. Poor exercise tolerance. How is this diagnosed? This condition is diagnosed based on: A physical exam and your medical history. Tests, which may include: Lung function studies to evaluate the flow of air in your lungs. Allergy  tests. Imaging tests, such as X-rays. How is this treated? There is no cure, but symptoms can be controlled with proper treatment. Treatment usually involves: Identifying and avoiding your asthma triggers. Inhaled medicines. Two types are commonly used to treat asthma, depending on severity: Controller medicines. These help prevent asthma symptoms from occurring. They are taken every day. Fast-acting reliever or rescue medicines. These quickly relieve asthma symptoms. They are used as needed and provide short-term relief. Using other medicines, such as: Allergy  medicines, such as antihistamines, if your asthma attacks are triggered by allergens. Immune medicines (immunomodulators). These are medicines that help control the immune system. Using supplemental oxygen . This is only needed during a severe episode. Creating an asthma action plan. An asthma action plan is a written plan for managing and treating your asthma attacks. This plan includes: A list of your asthma triggers and how to avoid them. Information about when medicines should be taken and when their dosage should be changed. Instructions about using a device called a peak flow meter. A peak flow meter measures how well the lungs are working and the severity of your asthma. It helps you monitor your condition. Follow these instructions at home: Take over-the-counter and prescription medicines only as told by your health care provider. Stay up to date on all vaccinations as recommended by your healthcare provider, including vaccines for the flu and pneumonia. Use a peak flow meter and keep track of your peak flow  readings. Understand and use your asthma  action plan to address any asthma flares. Do not smoke or allow anyone to smoke in your home. Contact a health care provider if: You have wheezing, shortness of breath, or a cough that is not responding to medicines. Your medicines are causing side effects, such as a rash, itching, swelling, or trouble breathing. You need to use a reliever medicine more than 2-3 times a week. Your peak flow reading is still at 50-79% of your personal best after following your action plan for 1 hour. You have a fever and shortness of breath. Get help right away if: You are getting worse and do not respond to treatment during an asthma attack. You are short of breath when at rest or when doing very little physical activity. You have difficulty eating, drinking, or talking. You have chest pain or tightness. You develop a fast heartbeat or palpitations. You have a bluish color to your lips or fingernails. You are light-headed or dizzy, or you faint. Your peak flow reading is less than 50% of your personal best. You feel too tired to breathe normally. These symptoms may be an emergency. Get help right away. Call 911. Do not wait to see if the symptoms will go away. Do not drive yourself to the hospital. Summary Asthma is a long-term (chronic) condition that causes recurrent episodes in which the airways become tight and narrow. Asthma episodes, also called asthma attacks or asthma flares, can cause coughing, wheezing, shortness of breath, and chest pain. Asthma cannot be cured, but medicines and lifestyle changes can help keep it well controlled and prevent asthma flares. Make sure you understand how to avoid triggers and how and when to use your medicines. Asthma attacks can range from minor to life-threatening. Get help right away if you have an asthma attack and do not respond to treatment with your usual rescue medicines. This information is not intended to replace  advice given to you by your health care provider. Make sure you discuss any questions you have with your health care provider. Document Revised: 02/08/2021 Document Reviewed: 01/30/2021 Elsevier Patient Education  2024 [employer]Sinus Infection, Adult A sinus infection, also called sinusitis, is inflammation of your sinuses. Sinuses are hollow spaces in the bones around your face. Your sinuses are located: Around your eyes. In the middle of your forehead. Behind your nose. In your cheekbones. Mucus normally drains out of your sinuses. When your nasal tissues become inflamed or swollen, mucus can become trapped or blocked. This allows bacteria, viruses, and fungi to grow, which leads to infection. Most infections of the sinuses are caused by a virus. A sinus infection can develop quickly. It can last for up to 4 weeks (acute) or for more than 12 weeks (chronic). A sinus infection often develops after a cold. What are the causes? This condition is caused by anything that creates swelling in the sinuses or stops mucus from draining. This includes: Allergies. Asthma. Infection from bacteria or viruses. Deformities or blockages in your nose or sinuses. Abnormal growths in the nose (nasal polyps). Pollutants, such as chemicals or irritants in the air. Infection from fungi. This is rare. What increases the risk? You are more likely to develop this condition if you: Have a weak body defense system (immune system). Do a lot of swimming or diving. Overuse nasal sprays. Smoke. What are the signs or symptoms? The main symptoms of this condition are pain and a feeling of pressure around the affected sinuses. Other symptoms include: Stuffy nose or congestion that  makes it difficult to breathe through your nose. Thick yellow or greenish drainage from your nose. Tenderness, swelling, and warmth over the affected sinuses. A cough that may get worse at night. Decreased sense of smell and  taste. Extra mucus that collects in the throat or the back of the nose (postnasal drip) causing a sore throat or bad breath. Tiredness (fatigue). Fever. How is this diagnosed? This condition is diagnosed based on: Your symptoms. Your medical history. A physical exam. Tests to find out if your condition is acute or chronic. This may include: Checking your nose for nasal polyps. Viewing your sinuses using a device that has a light (endoscope). Testing for allergies or bacteria. Imaging tests, such as an MRI or CT scan. In rare cases, a bone biopsy may be done to rule out more serious types of fungal sinus disease. How is this treated? Treatment for a sinus infection depends on the cause and whether your condition is chronic or acute. If caused by a virus, your symptoms should go away on their own within 10 days. You may be given medicines to relieve symptoms. They include: Medicines that shrink swollen nasal passages (decongestants). A spray that eases inflammation of the nostrils (topical intranasal corticosteroids). Rinses that help get rid of thick mucus in your nose (nasal saline washes). Medicines that treat allergies (antihistamines). Over-the-counter pain relievers. If caused by bacteria, your health care provider may recommend waiting to see if your symptoms improve. Most bacterial infections will get better without antibiotic medicine. You may be given antibiotics if you have: A severe infection. A weak immune system. If caused by narrow nasal passages or nasal polyps, surgery may be needed. Follow these instructions at home: Medicines Take, use, or apply over-the-counter and prescription medicines only as told by your health care provider. These may include nasal sprays. If you were prescribed an antibiotic medicine, take it as told by your health care provider. Do not stop taking the antibiotic even if you start to feel better. Hydrate and humidify  Drink enough fluid to  keep your urine pale yellow. Staying hydrated will help to thin your mucus. Use a cool mist humidifier to keep the humidity level in your home above 50%. Inhale steam for 10-15 minutes, 3-4 times a day, or as told by your health care provider. You can do this in the bathroom while a hot shower is running. Limit your exposure to cool or dry air. Rest Rest as much as possible. Sleep with your head raised (elevated). Make sure you get enough sleep each night. General instructions  Apply a warm, moist washcloth to your face 3-4 times a day or as told by your health care provider. This will help with discomfort. Use nasal saline washes as often as told by your health care provider. Wash your hands often with soap and water to reduce your exposure to germs. If soap and water are not available, use hand sanitizer. Do not smoke. Avoid being around people who are smoking (secondhand smoke). Keep all follow-up visits. This is important. Contact a health care provider if: You have a fever. Your symptoms get worse. Your symptoms do not improve within 10 days. Get help right away if: You have a severe headache. You have persistent vomiting. You have severe pain or swelling around your face or eyes. You have vision problems. You develop confusion. Your neck is stiff. You have trouble breathing. These symptoms may be an emergency. Get help right away. Call 911. Do not wait to  see if the symptoms will go away. Do not drive yourself to the hospital. Summary A sinus infection is soreness and inflammation of your sinuses. Sinuses are hollow spaces in the bones around your face. This condition is caused by nasal tissues that become inflamed or swollen. The swelling traps or blocks the flow of mucus. This allows bacteria, viruses, and fungi to grow, which leads to infection. If you were prescribed an antibiotic medicine, take it as told by your health care provider. Do not stop taking the antibiotic even  if you start to feel better. Keep all follow-up visits. This is important. This information is not intended to replace advice given to you by your health care provider. Make sure you discuss any questions you have with your health care provider. Document Revised: 03/28/2021 Document Reviewed: 03/28/2021 Elsevier Patient Education  2024 [employer].

## 2024-05-21 NOTE — Progress Notes (Signed)
 Complex Care Management Note Care Guide Note  05/21/2024 Name: Meredith Diaz MRN: [id number] DOB: 04-28-1981   Complex Care Management Outreach Attempts: An unsuccessful telephone outreach was attempted today to offer the patient information about available complex care management services.  Follow Up Plan:  invalid phone # - my chart message sent   Thedford Franks, CMA, AAMA [place]  [facility], [facility] Guide, Lead Direct Dial: [phone]  Fax: [phone]
